# Patient Record
Sex: Female | Born: 1950
Health system: Southern US, Community
[De-identification: ages and names within clinical notes are randomized; demographics above are authoritative.]

## PROBLEM LIST (undated history)

## (undated) DIAGNOSIS — R7303 Prediabetes: Secondary | ICD-10-CM

## (undated) DIAGNOSIS — I639 Cerebral infarction, unspecified: Secondary | ICD-10-CM

## (undated) DIAGNOSIS — I10 Essential (primary) hypertension: Secondary | ICD-10-CM

## (undated) DIAGNOSIS — K219 Gastro-esophageal reflux disease without esophagitis: Secondary | ICD-10-CM

## (undated) DIAGNOSIS — F32A Depression, unspecified: Secondary | ICD-10-CM

## (undated) DIAGNOSIS — C541 Malignant neoplasm of endometrium: Secondary | ICD-10-CM

## (undated) DIAGNOSIS — U071 COVID-19: Secondary | ICD-10-CM

## (undated) DIAGNOSIS — F329 Major depressive disorder, single episode, unspecified: Secondary | ICD-10-CM

## (undated) DIAGNOSIS — T4145XA Adverse effect of unspecified anesthetic, initial encounter: Secondary | ICD-10-CM

## (undated) DIAGNOSIS — Z8673 Personal history of transient ischemic attack (TIA), and cerebral infarction without residual deficits: Secondary | ICD-10-CM

## (undated) DIAGNOSIS — F419 Anxiety disorder, unspecified: Secondary | ICD-10-CM

## (undated) DIAGNOSIS — J45909 Unspecified asthma, uncomplicated: Secondary | ICD-10-CM

## (undated) DIAGNOSIS — I429 Cardiomyopathy, unspecified: Secondary | ICD-10-CM

## (undated) DIAGNOSIS — T8859XA Other complications of anesthesia, initial encounter: Secondary | ICD-10-CM

## (undated) DIAGNOSIS — E785 Hyperlipidemia, unspecified: Secondary | ICD-10-CM

## (undated) HISTORY — DX: Unspecified asthma, uncomplicated: J45.909

## (undated) HISTORY — DX: Hyperlipidemia, unspecified: E78.5

## (undated) HISTORY — DX: Malignant neoplasm of endometrium: C54.1

## (undated) HISTORY — DX: Personal history of transient ischemic attack (TIA), and cerebral infarction without residual deficits: Z86.73

## (undated) HISTORY — PX: ABDOMINAL HYSTERECTOMY: SHX81

## (undated) HISTORY — DX: Prediabetes: R73.03

## (undated) HISTORY — PX: CHOLECYSTECTOMY: SHX55

## (undated) HISTORY — DX: Essential (primary) hypertension: I10

---
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
## 1988-12-29 DIAGNOSIS — I639 Cerebral infarction, unspecified: Secondary | ICD-10-CM

## 1988-12-29 HISTORY — DX: Cerebral infarction, unspecified: I63.9

## 2009-12-07 ENCOUNTER — Ambulatory Visit: Payer: Self-pay | Admitting: Family Medicine

## 2011-05-20 ENCOUNTER — Other Ambulatory Visit: Payer: Self-pay

## 2013-12-26 ENCOUNTER — Emergency Department: Payer: Self-pay | Admitting: Emergency Medicine

## 2015-11-28 ENCOUNTER — Ambulatory Visit: Payer: Self-pay | Admitting: Family Medicine

## 2015-12-05 ENCOUNTER — Ambulatory Visit: Payer: Self-pay | Admitting: Family Medicine

## 2015-12-10 ENCOUNTER — Encounter: Payer: Self-pay | Admitting: Family Medicine

## 2015-12-10 ENCOUNTER — Ambulatory Visit (INDEPENDENT_AMBULATORY_CARE_PROVIDER_SITE_OTHER): Payer: Self-pay | Admitting: Family Medicine

## 2015-12-10 VITALS — BP 110/68 | HR 82 | Temp 97.9°F | Resp 12 | Ht 59.0 in | Wt 217.8 lb

## 2015-12-10 DIAGNOSIS — B839 Helminthiasis, unspecified: Secondary | ICD-10-CM | POA: Insufficient documentation

## 2015-12-10 DIAGNOSIS — Z889 Allergy status to unspecified drugs, medicaments and biological substances status: Secondary | ICD-10-CM

## 2015-12-10 DIAGNOSIS — E785 Hyperlipidemia, unspecified: Secondary | ICD-10-CM

## 2015-12-10 DIAGNOSIS — I10 Essential (primary) hypertension: Secondary | ICD-10-CM | POA: Insufficient documentation

## 2015-12-10 DIAGNOSIS — Z8673 Personal history of transient ischemic attack (TIA), and cerebral infarction without residual deficits: Secondary | ICD-10-CM | POA: Insufficient documentation

## 2015-12-10 DIAGNOSIS — I119 Hypertensive heart disease without heart failure: Secondary | ICD-10-CM | POA: Insufficient documentation

## 2015-12-10 DIAGNOSIS — Z789 Other specified health status: Secondary | ICD-10-CM | POA: Insufficient documentation

## 2015-12-10 DIAGNOSIS — I43 Cardiomyopathy in diseases classified elsewhere: Secondary | ICD-10-CM

## 2015-12-10 HISTORY — DX: Personal history of transient ischemic attack (TIA), and cerebral infarction without residual deficits: Z86.73

## 2015-12-10 HISTORY — DX: Hyperlipidemia, unspecified: E78.5

## 2015-12-10 MED ORDER — MEBENDAZOLE 100 MG PO CHEW: 100.0000 mg | CHEWABLE_TABLET | Freq: Two times a day (BID) | ORAL | Status: DC

## 2015-12-10 NOTE — Progress Notes (Signed)
Name: Molly [name]   MRN: [id number]    DOB: [birth year]   Date:12/10/2015       Progress Note  Subjective  Chief Complaint  Chief Complaint  Patient presents with  . Establish Care  . GI Problem    patient stated that she has 2 episodes where she saw a parasite after bowel movement.    HPI  Molly [name] is a 64 y.o. female here today to transition care of medical needs to a primary care provider. She has a history of "heart damage" but does not report a history of MI or CAD. She had a hard time controlling her blood pressure she reports leading to heart damage. She is on micardis, hctz, furosemide doses unknown to her at this time. Reports her symptoms well controled.   Within the past 6 weeks she noticed 2 episodes of possible worm in the toilet bowl with her stools. Reports no changes to her bowel habits, no abdominal pain, some rumbling in her epigastric area but not uncomfortable. No nausea or vomiting or fevers.  Labs done earlier this year. Self pay today, can not afford to much testing.     Past Medical History  Diagnosis Date  . Asthma     history of asthma  . Hypertension     Patient Active Problem List   Diagnosis Date Noted  . Hypertension goal BP (blood pressure) < 140/90 12/10/2015    Social History  Substance Use Topics  . Smoking status: Never Smoker   . Smokeless tobacco: Not on file  . Alcohol Use: No    No current outpatient prescriptions on file.  Past Surgical History  Procedure Laterality Date  . Cholecystectomy      Family History  Problem Relation Age of Onset  . Gout Father   . Asthma Father   . Asthma Brother   . Dementia Brother   . Stroke Brother     Allergies  Allergen Reactions  . Nsaids Hives and Rash  . Statins Other (See Comments)    Joint pains     Review of Systems  CONSTITUTIONAL: No significant weight changes, fever, chills, weakness or fatigue.  HEENT:  - Eyes: No visual changes.  - Ears: No auditory  changes. No pain.  - Nose: No sneezing, congestion, runny nose. - Throat: No sore throat. No changes in swallowing. SKIN: No rash or itching.  CARDIOVASCULAR: No chest pain, chest pressure or chest discomfort. No palpitations or edema.  RESPIRATORY: No shortness of breath, cough or sputum.  GASTROINTESTINAL: No anorexia, nausea, vomiting. No changes in bowel habits. No abdominal pain or blood.  GENITOURINARY: No dysuria. No frequency. No discharge. NEUROLOGICAL: No headache, dizziness, syncope, paralysis, ataxia, numbness or tingling in the extremities. No memory changes. No change in bowel or bladder control.  MUSCULOSKELETAL: No joint pain. No muscle pain. HEMATOLOGIC: No anemia, bleeding or bruising.  LYMPHATICS: No enlarged lymph nodes.  PSYCHIATRIC: No change in mood. No change in sleep pattern.  ENDOCRINOLOGIC: No reports of sweating, cold or heat intolerance. No polyuria or polydipsia.     Objective  BP 110/68 mmHg  Pulse 82  Temp(Src) 97.9 F (36.6 C) (Oral)  Resp 12  Ht 4\' 11"  (1.499 m)  Wt 217 lb 12.8 oz (98.793 kg)  BMI 43.97 kg/m2  SpO2 95% Body mass index is 43.97 kg/(m^2).  Physical Exam  Constitutional: Patient is obese and well-nourished. In no distress.  HEENT:  - Head: Normocephalic and atraumatic.  - Nose:  Nasal mucosa moist - Mouth/Throat: Oropharynx is clear and moist. No tonsillar hypertrophy or erythema. No post nasal drainage.  - Eyes: Conjunctivae clear, EOM movements normal. PERRLA. No scleral icterus.  Neck: Normal range of motion. Neck supple. No JVD present. No thyromegaly present.  Cardiovascular: Normal rate, regular rhythm and normal heart sounds.  No murmur heard.  Pulmonary/Chest: Effort normal and breath sounds normal. No respiratory distress. Abdomen: Soft, obese, non tender, normal bowel sounds.  Peripheral vascular: Bilateral LE no edema. Psychiatric: Patient has a normal mood and affect. Behavior is normal in office today. Judgment and  thought content normal in office today.   Assessment & Plan  1. Hypertension goal BP (blood pressure) < 140/90 Well controled today. Need to update med list with correct doses.   2. Cardiomyopathy due to hypertension, without heart failure ([place]) Need to review previous medical records, may or may not involve heart failure.   3. History of CVA (cerebrovascular accident) Medical management, intolerant to statins unfortunately.   4. Hyperlipidemia LDL goal <70 Needs repeat blood work when has finances.   5. Statin intolerance   6. Worm infection I would like to have stool studies done however due to self pay status will treat.  - mebendazole (EMVERM) 100 MG chewable tablet; Chew 1 tablet (100 mg total) by mouth 2 (two) times daily.  Dispense: 6 tablet; Refill: 0

## 2016-01-30 ENCOUNTER — Encounter: Payer: Self-pay | Admitting: Family Medicine

## 2016-02-04 ENCOUNTER — Other Ambulatory Visit: Payer: Self-pay

## 2016-02-04 NOTE — Telephone Encounter (Signed)
Molly [name] states her mothers sciatica is acting up and would like refill

## 2016-02-06 MED ORDER — BACLOFEN 10 MG PO TABS: 10.0000 mg | ORAL_TABLET | Freq: Three times a day (TID) | ORAL | Status: DC

## 2016-02-07 ENCOUNTER — Other Ambulatory Visit: Payer: Self-pay

## 2016-02-08 ENCOUNTER — Other Ambulatory Visit: Payer: Self-pay

## 2016-02-08 MED ORDER — FUROSEMIDE 40 MG PO TABS: 40.0000 mg | ORAL_TABLET | Freq: Every day | ORAL | Status: DC

## 2016-02-11 ENCOUNTER — Other Ambulatory Visit: Payer: Self-pay | Admitting: Family Medicine

## 2016-02-11 DIAGNOSIS — R05 Cough: Secondary | ICD-10-CM

## 2016-02-11 DIAGNOSIS — R059 Cough, unspecified: Secondary | ICD-10-CM

## 2016-02-11 MED ORDER — BENZONATATE 200 MG PO CAPS: 200.0000 mg | ORAL_CAPSULE | Freq: Three times a day (TID) | ORAL | Status: DC | PRN

## 2016-02-11 MED ORDER — HYDROCOD POLST-CPM POLST ER 10-8 MG/5ML PO SUER: 5.0000 mL | Freq: Two times a day (BID) | ORAL | Status: DC | PRN

## 2016-02-13 ENCOUNTER — Ambulatory Visit (INDEPENDENT_AMBULATORY_CARE_PROVIDER_SITE_OTHER): Payer: Self-pay | Admitting: Family Medicine

## 2016-02-13 ENCOUNTER — Encounter: Payer: Self-pay | Admitting: Family Medicine

## 2016-02-13 VITALS — BP 122/78 | HR 79 | Temp 98.6°F | Resp 18 | Wt 225.0 lb

## 2016-02-13 DIAGNOSIS — J209 Acute bronchitis, unspecified: Secondary | ICD-10-CM

## 2016-02-13 DIAGNOSIS — J40 Bronchitis, not specified as acute or chronic: Secondary | ICD-10-CM

## 2016-02-13 MED ORDER — FLUTICASONE-SALMETEROL 250-50 MCG/DOSE IN AEPB: 1.0000 | INHALATION_SPRAY | Freq: Two times a day (BID) | RESPIRATORY_TRACT | Status: DC

## 2016-02-13 MED ORDER — PREDNISONE 10 MG (21) PO TBPK: ORAL_TABLET | ORAL | Status: DC

## 2016-02-13 NOTE — Progress Notes (Signed)
Name: Molly [name]   MRN: [id number]    DOB: [birth year]   Date:02/13/2016       Progress Note  Subjective  Chief Complaint  Chief Complaint  Patient presents with  . Bronchitis    HPI  Molly [name] is a 65 year old female who is here today with concerns regarding the following symptoms sore throat, congestion, cough, achiness and low grade fevers that started 1 week ago.  Associated with chills, sweats and fatigue. Now progressing to wheezing. Not associated with high fever. Her CXR in the ER is reported to have been clear. Has tried the following home remedies: ER- gave her amoxicillin and albuterol inhaler. She is still having wheezing. In the past prednisone has caused her to feel hyper but she is willing to use it again if needed.   Past Medical History  Diagnosis Date  . Asthma     history of asthma  . Hypertension     Social History  Substance Use Topics  . Smoking status: Never Smoker   . Smokeless tobacco: Not on file  . Alcohol Use: No     Current outpatient prescriptions:  .  albuterol (PROVENTIL HFA;VENTOLIN HFA) 108 (90 Base) MCG/ACT inhaler, Inhale 2 puffs into the lungs every 4 (four) hours as needed for wheezing or shortness of breath., Disp: , Rfl:  .  baclofen (LIORESAL) 10 MG tablet, Take 1 tablet (10 mg total) by mouth 3 (three) times daily., Disp: 30 each, Rfl: 1 .  benzonatate (TESSALON) 200 MG capsule, Take 1 capsule (200 mg total) by mouth 3 (three) times daily as needed for cough., Disp: 30 capsule, Rfl: 0 .  cetirizine (ZYRTEC) 10 MG tablet, Take 10 mg by mouth daily., Disp: , Rfl:  .  chlorpheniramine-HYDROcodone (TUSSIONEX PENNKINETIC ER) 10-8 MG/5ML SUER, Take 5 mLs by mouth every 12 (twelve) hours as needed., Disp: 115 mL, Rfl: 0 .  diclofenac sodium (VOLTAREN) 1 % GEL, Apply 2 g topically 4 (four) times daily., Disp: , Rfl:  .  furosemide (LASIX) 40 MG tablet, Take 1 tablet (40 mg total) by mouth daily., Disp: 30 tablet, Rfl: 5 .   hydrochlorothiazide (HYDRODIURIL) 25 MG tablet, Take 25 mg by mouth daily., Disp: , Rfl:  .  olmesartan-hydrochlorothiazide (BENICAR HCT) 20-12.5 MG tablet, Take 1 tablet by mouth daily., Disp: , Rfl:  .  potassium chloride (K-DUR) 10 MEQ tablet, Take 10 mEq by mouth daily., Disp: , Rfl:   Allergies  Allergen Reactions  . Nsaids Hives and Rash  . Statins Other (See Comments)    Joint pains    ROS  Positive for fatigue, nasal congestion, sinus pressure, ear fullness, cough as mentioned in HPI, otherwise all systems reviewed and are negative.  Objective  Blood pressure 122/78, pulse 79, temperature 98.6 F (37 C), temperature source Oral, resp. rate 18, weight 225 lb (102.059 kg), SpO2 94 %. Body mass index is 45.42 kg/(m^2).  Physical Exam  Constitutional: Patient is obese and well-nourished. In no acute distress but does appear to be fatigued from acute illness. HEENT:  - Head: Normocephalic and atraumatic.  - Ears: RIGHT TM bulging with minimal clear exudate, LEFT TM bulging with minimal clear exudate.  - Nose: Nasal mucosa boggy and congested.  - Mouth/Throat: Oropharynx is moist with slight erythema of bilateral tonsils without hypertrophy or exudates. Post nasal drainage present.  - Eyes: Conjunctivae clear, EOM movements normal. PERRLA. No scleral icterus.  Neck: Normal range of motion. Neck supple. No JVD present.  No thyromegaly present. No local lymphadenopathy. Cardiovascular: Regular rate, regular rhythm with no murmurs heard.  Pulmonary/Chest: Effort normal and breath sounds decreased with scattered end expiratory wheezing. Skin: Skin is warm and dry. No rash noted. Psychiatric: Patient has her kind baseline mood and affect. Behavior is normal in office today. Judgment and thought content normal in office today.   Assessment & Plan  1. Bronchitis with bronchospasm Tussionex is helping with coughing symptoms but she has extensive wheezing and airway reactivity on  exam. She is willing to start prednisone and inhaled corticosteroid if cost effective. If symptoms persist past this weekend will add on Levaquin 500 mg one a day for 7 days.  - predniSONE (STERAPRED UNI-PAK 21 TAB) 10 MG (21) TBPK tablet; Use as directed in a 6 day taper pack  Dispense: 21 tablet; Refill: 0 - Fluticasone-Salmeterol (ADVAIR DISKUS) 250-50 MCG/DOSE AEPB; Inhale 1 puff into the lungs 2 (two) times daily.  Dispense: 60 each; Refill: 0

## 2016-02-14 ENCOUNTER — Telehealth: Payer: Self-pay

## 2016-02-14 DIAGNOSIS — J209 Acute bronchitis, unspecified: Secondary | ICD-10-CM

## 2016-02-14 MED ORDER — FLUTICASONE FUROATE-VILANTEROL 100-25 MCG/INH IN AEPB: 1.0000 | INHALATION_SPRAY | Freq: Every day | RESPIRATORY_TRACT | Status: DC

## 2016-02-14 NOTE — Telephone Encounter (Signed)
Sorry Garvin Fila said they only have the Breo in 200 is this ok?

## 2016-02-14 NOTE — Telephone Encounter (Signed)
Tomeka wanted me to ask you if you rather her mother be on Breo instead of advair? If so can u write new rx

## 2016-02-14 NOTE — Telephone Encounter (Signed)
I faxed the Breo Rx to the [facility] management clinic on [address]. If that didn't go through you can call it in. Let Temeka know, thank you.

## 2016-02-15 MED ORDER — FLUTICASONE FUROATE-VILANTEROL 200-25 MCG/INH IN AEPB: 1.0000 | INHALATION_SPRAY | Freq: Every day | RESPIRATORY_TRACT | Status: DC

## 2016-02-15 MED ORDER — LEVOFLOXACIN 750 MG PO TABS: 750.0000 mg | ORAL_TABLET | Freq: Every day | ORAL | Status: DC

## 2016-02-15 NOTE — Telephone Encounter (Signed)
Hi Jamie, I also texted Kristeen Miss about it since she texted me this morning.Marland KitchenMarland KitchenI have sent a new RX to medication management clinic for Breo 257mcg dose AND Levofloxacin 750mg  one a day for 10 days (if they don't have this dose and they have 500 mg dose that is okay to call in too (one a day for 7 or 10 days depending on cost)). IF they don't have the levaquin at all try calling it into [facility] they should have it.

## 2016-02-15 NOTE — Telephone Encounter (Signed)
[facility] notified.  I also called and they do have Levaquin 750mg 

## 2016-02-28 ENCOUNTER — Other Ambulatory Visit: Payer: Self-pay

## 2016-02-28 DIAGNOSIS — R059 Cough, unspecified: Secondary | ICD-10-CM

## 2016-02-28 DIAGNOSIS — R05 Cough: Secondary | ICD-10-CM

## 2016-02-28 NOTE — Telephone Encounter (Signed)
Refill request was sent to Dr. Krichna Sowles for approval and submission.  

## 2016-03-05 ENCOUNTER — Other Ambulatory Visit: Payer: Self-pay | Admitting: Family Medicine

## 2016-03-05 DIAGNOSIS — E876 Hypokalemia: Secondary | ICD-10-CM

## 2016-03-05 DIAGNOSIS — R059 Cough, unspecified: Secondary | ICD-10-CM

## 2016-03-05 DIAGNOSIS — R05 Cough: Secondary | ICD-10-CM

## 2016-03-05 DIAGNOSIS — I10 Essential (primary) hypertension: Secondary | ICD-10-CM

## 2016-03-05 MED ORDER — OLMESARTAN MEDOXOMIL-HCTZ 20-12.5 MG PO TABS: 1.0000 | ORAL_TABLET | Freq: Every day | ORAL | Status: DC

## 2016-03-05 MED ORDER — POTASSIUM CHLORIDE ER 10 MEQ PO TBCR: 10.0000 meq | EXTENDED_RELEASE_TABLET | Freq: Every day | ORAL | Status: DC

## 2016-03-05 MED ORDER — HYDROCOD POLST-CPM POLST ER 10-8 MG/5ML PO SUER: 5.0000 mL | Freq: Two times a day (BID) | ORAL | Status: DC | PRN

## 2016-03-05 MED ORDER — POTASSIUM CHLORIDE CRYS ER 10 MEQ PO TBCR: 10.0000 meq | EXTENDED_RELEASE_TABLET | Freq: Every day | ORAL | Status: DC

## 2016-03-05 MED ORDER — HYDROCHLOROTHIAZIDE 25 MG PO TABS: 25.0000 mg | ORAL_TABLET | Freq: Every day | ORAL | Status: DC

## 2016-03-05 MED ORDER — FUROSEMIDE 40 MG PO TABS: 40.0000 mg | ORAL_TABLET | Freq: Every day | ORAL | Status: DC

## 2016-03-05 NOTE — Progress Notes (Signed)
K-dur is not absorbing, finding pill in stool despite cutting in half. Will try her on Klor-Con

## 2016-03-05 NOTE — Progress Notes (Signed)
K-dur is the only med stocked at current pharmacy, pharmacist explained that the med is being absorbed but the capsule remnants are in the stool.

## 2016-05-14 ENCOUNTER — Ambulatory Visit (INDEPENDENT_AMBULATORY_CARE_PROVIDER_SITE_OTHER): Payer: Self-pay | Admitting: Family Medicine

## 2016-05-14 ENCOUNTER — Encounter: Payer: Self-pay | Admitting: Family Medicine

## 2016-05-14 VITALS — BP 116/72 | HR 85 | Temp 98.1°F | Resp 16 | Ht 59.0 in | Wt 226.2 lb

## 2016-05-14 DIAGNOSIS — R600 Localized edema: Secondary | ICD-10-CM

## 2016-05-14 DIAGNOSIS — J302 Other seasonal allergic rhinitis: Secondary | ICD-10-CM | POA: Insufficient documentation

## 2016-05-14 DIAGNOSIS — J4520 Mild intermittent asthma, uncomplicated: Secondary | ICD-10-CM | POA: Insufficient documentation

## 2016-05-14 DIAGNOSIS — J3089 Other allergic rhinitis: Secondary | ICD-10-CM

## 2016-05-14 DIAGNOSIS — M25561 Pain in right knee: Secondary | ICD-10-CM

## 2016-05-14 DIAGNOSIS — Z8673 Personal history of transient ischemic attack (TIA), and cerebral infarction without residual deficits: Secondary | ICD-10-CM

## 2016-05-14 DIAGNOSIS — I10 Essential (primary) hypertension: Secondary | ICD-10-CM

## 2016-05-14 DIAGNOSIS — J309 Allergic rhinitis, unspecified: Secondary | ICD-10-CM

## 2016-05-14 MED ORDER — OLMESARTAN MEDOXOMIL-HCTZ 20-12.5 MG PO TABS: 1.0000 | ORAL_TABLET | Freq: Every day | ORAL | Status: DC

## 2016-05-14 MED ORDER — ACETAMINOPHEN 500 MG PO TABS: 500.0000 mg | ORAL_TABLET | Freq: Four times a day (QID) | ORAL | Status: DC | PRN

## 2016-05-14 MED ORDER — FUROSEMIDE 40 MG PO TABS: 40.0000 mg | ORAL_TABLET | Freq: Every day | ORAL | Status: DC

## 2016-05-14 MED ORDER — CETIRIZINE HCL 10 MG PO TABS: 10.0000 mg | ORAL_TABLET | Freq: Every day | ORAL | Status: DC

## 2016-05-14 MED ORDER — MONTELUKAST SODIUM 10 MG PO TABS: 10.0000 mg | ORAL_TABLET | Freq: Every day | ORAL | Status: DC

## 2016-05-14 NOTE — Progress Notes (Signed)
Name: Molly [name]   MRN: [id number]    DOB: [birth year]   Date:05/14/2016       Progress Note  Subjective  Chief Complaint  Chief Complaint  Patient presents with  . Medication Refill  . Knee Pain    right knee from the inside to the back of the knee. patient states that they hurt and when she stands up it gets stiff. patient stated that it has been going on for quite some time now, but it is sporadic.     HPI  HTN: she does not have insurance, getting rx through [facility] for free. She is compliant with medication, no chest pain or palpitation. BP is towards low end of normal and is taking too many diuretics. We will stop HCTZ 25 mg, continue Benicar HCZT and lasix for swelling. She denies cramping and has been taking potassium as prescribed.   Asthma Mild intermittent: she has a rescue inhaler and states using it more often this time of the year because of allergies. Also during Fall. She develops wheezing but no SOB when she is outside and resolves with inhaler.   AR: taking Zyrtec, and still has watery and itchy eyes, no nasal congestion  History of CVA: many years ago in the 90's , unable to tolerate statin therapy, bp is at goal, no focal deficit  Bilateral  Knee pain: she states she has daily pain, on either knee, but seems to be worse on right side. She states that pain is described as aching usually in the front of the knee, but at times has pain on right popliteal fossa. Worse over the past 6 months but also having some right posterior leg pain when sitting and applying pressure. Chronic swelling of right ankle from an injury as a child.   Patient Active Problem List   Diagnosis Date Noted  . Perennial allergic rhinitis with seasonal variation 05/14/2016  . Asthma, mild intermittent, well-controlled 05/14/2016  . Hypertension goal BP (blood pressure) < 140/90 12/10/2015  . Cardiomyopathy due to hypertension ([place]) 12/10/2015  . History of CVA (cerebrovascular  accident) 12/10/2015  . Hyperlipidemia LDL goal <70 12/10/2015  . Statin intolerance 12/10/2015    Past Surgical History  Procedure Laterality Date  . Cholecystectomy      Family History  Problem Relation Age of Onset  . Gout Father   . Asthma Father   . Asthma Brother   . Dementia Brother   . Stroke Brother     Social History   Social History  . Marital Status: Divorced    Spouse Name: N/A  . Number of Children: N/A  . Years of Education: N/A   Occupational History  . Not on file.   Social History Main Topics  . Smoking status: Never Smoker   . Smokeless tobacco: Not on file  . Alcohol Use: No  . Drug Use: No  . Sexual Activity: No   Other Topics Concern  . Not on file   Social History Narrative     Current outpatient prescriptions:  .  Cholecalciferol (VITAMIN D3) 1000 units CAPS, Take by mouth as needed., Disp: , Rfl:  .  GARLIC PO, Take by mouth., Disp: , Rfl:  .  albuterol (PROVENTIL HFA;VENTOLIN HFA) 108 (90 Base) MCG/ACT inhaler, Inhale 2 puffs into the lungs every 4 (four) hours as needed for wheezing or shortness of breath., Disp: , Rfl:  .  baclofen (LIORESAL) 10 MG tablet, Take 1 tablet (10 mg total) by  mouth 3 (three) times daily., Disp: 30 each, Rfl: 1 .  cetirizine (ZYRTEC) 10 MG tablet, Take 1 tablet (10 mg total) by mouth daily., Disp: 90 tablet, Rfl: 1 .  diclofenac sodium (VOLTAREN) 1 % GEL, Apply 2 g topically 4 (four) times daily., Disp: , Rfl:  .  furosemide (LASIX) 40 MG tablet, Take 1 tablet (40 mg total) by mouth daily., Disp: 90 tablet, Rfl: 1 .  olmesartan-hydrochlorothiazide (BENICAR HCT) 20-12.5 MG tablet, Take 1 tablet by mouth daily., Disp: 90 tablet, Rfl: 1 .  potassium chloride (K-DUR,KLOR-CON) 10 MEQ tablet, Take 1 tablet (10 mEq total) by mouth daily., Disp: 90 tablet, Rfl: 1  Allergies  Allergen Reactions  . Nsaids Hives and Rash  . Statins Other (See Comments)    Joint pains     ROS  Ten systems reviewed and is  negative except as mentioned in HPI   Objective  Filed Vitals:   05/14/16 1233  BP: 116/72  Pulse: 85  Temp: 98.1 F (36.7 C)  TempSrc: Oral  Resp: 16  Height: 4\' 11"  (1.499 m)  Weight: 226 lb 3.2 oz (102.604 kg)  SpO2: 94%    Body mass index is 45.66 kg/(m^2).  Physical Exam  Constitutional: Patient appears well-developed and well-nourished. Obese  No distress.  HEENT: head atraumatic, normocephalic, pupils equal and reactive to light, neck supple, throat within normal limits Cardiovascular: Normal rate, regular rhythm and normal heart sounds.  No murmur heard. No BLE edema. Pulmonary/Chest: Effort normal and breath sounds normal. No respiratory distress. Abdominal: Soft.  There is no tenderness. Psychiatric: Patient has a normal mood and affect. behavior is normal. Judgment and thought content normal. Muscular Skeletal: no crepitus with extension of knee, no effusion, some swelling on popliteal fossa  PHQ2/9: Depression screen [facility] 2/9 05/14/2016 02/13/2016 12/10/2015  Decreased Interest 0 0 0  Down, Depressed, Hopeless 0 0 0  PHQ - 2 Score 0 0 0    Fall Risk: Fall Risk  05/14/2016 02/13/2016 12/10/2015  Falls in the past year? No No No    Functional Status Survey: Is the patient deaf or have difficulty hearing?: No Does the patient have difficulty seeing, even when wearing glasses/contacts?: No Does the patient have difficulty concentrating, remembering, or making decisions?: No Does the patient have difficulty walking or climbing stairs?: Yes Does the patient have difficulty dressing or bathing?: No Does the patient have difficulty doing errands alone such as visiting a doctor's office or shopping?: No    Assessment & Plan  1. Hypertension goal BP (blood pressure) < 140/90  Stop HCTZ, continue other medications  2. Perennial allergic rhinitis with seasonal variation  - cetirizine (ZYRTEC) 10 MG tablet; Take 1 tablet (10 mg total) by mouth daily.  Dispense: 90  tablet; Refill: 1 - montelukast (SINGULAIR) 10 MG tablet; Take 1 tablet (10 mg total) by mouth at bedtime.  Dispense: 90 tablet; Refill: 1  3. History of CVA (cerebrovascular accident)  No recent episodes  4. Asthma, mild intermittent, well-controlled  - montelukast (SINGULAIR) 10 MG tablet; Take 1 tablet (10 mg total) by mouth at bedtime.  Dispense: 90 tablet; Refill: 1  5. Right knee pain  Likely OA and Baker's cyst but can't afford X-ray - acetaminophen (TYLENOL) 500 MG tablet; Take 1 tablet (500 mg total) by mouth every 6 (six) hours as needed.  Dispense: 30 tablet; Refill: 0  6. Leg edema, right  She needs X-ray knee and vascular surgeon evaluation, but does not have insurance and  can't afford it

## 2016-06-02 ENCOUNTER — Other Ambulatory Visit: Payer: Self-pay

## 2016-06-02 DIAGNOSIS — E876 Hypokalemia: Secondary | ICD-10-CM

## 2016-06-02 MED ORDER — POTASSIUM CHLORIDE CRYS ER 10 MEQ PO TBCR: 10.0000 meq | EXTENDED_RELEASE_TABLET | Freq: Every day | ORAL | Status: DC

## 2016-06-04 ENCOUNTER — Telehealth: Payer: Self-pay

## 2016-06-04 NOTE — Telephone Encounter (Signed)
Patient stated that she has been having bilateral swelling since stopping the HCTZ 25mg .  She was switched to Benicar 20-12.5.  Please advise.

## 2016-06-04 NOTE — Telephone Encounter (Signed)
The dose of HCTZ that she was taking was too high, and can deplete her potassium. She needs to buy compression stocking hoses and continue lasix prn

## 2016-08-28 ENCOUNTER — Other Ambulatory Visit: Payer: Self-pay | Admitting: Family Medicine

## 2016-08-28 ENCOUNTER — Other Ambulatory Visit: Payer: Self-pay

## 2016-08-28 DIAGNOSIS — I10 Essential (primary) hypertension: Secondary | ICD-10-CM

## 2016-08-28 NOTE — Telephone Encounter (Signed)
She is on Benicar/hctz Is that what she needs?

## 2016-08-28 NOTE — Telephone Encounter (Addendum)
Patient requesting refill of HCTZ be [facility] to [facility].

## 2016-08-29 ENCOUNTER — Other Ambulatory Visit: Payer: Self-pay

## 2016-08-29 DIAGNOSIS — E876 Hypokalemia: Secondary | ICD-10-CM

## 2016-08-29 NOTE — Telephone Encounter (Signed)
Patient requesting refill of Potassium, patient is out of refills and needs medication until her appointment. She only has 3 left in her bottle.

## 2016-09-01 MED ORDER — POTASSIUM CHLORIDE CRYS ER 10 MEQ PO TBCR: 10.0000 meq | EXTENDED_RELEASE_TABLET | Freq: Every day | ORAL | 1 refills | Status: DC

## 2016-09-03 NOTE — Telephone Encounter (Signed)
She should have an appointment soon, we will need to discuss it during her viist

## 2016-09-19 ENCOUNTER — Telehealth: Payer: Self-pay

## 2016-09-19 NOTE — Telephone Encounter (Signed)
Patient called and states for the past 2 days she has been experiencing a deep cough that is keeping her awake at night. She has been using her Breo with no relief. Could you please send in the [facility] for her at [place].

## 2016-09-19 NOTE — Telephone Encounter (Signed)
She needs to be seen, may be CAP or bronchitis

## 2016-09-22 ENCOUNTER — Ambulatory Visit: Payer: Self-pay | Admitting: Family Medicine

## 2016-09-24 NOTE — Telephone Encounter (Signed)
We had scheduled patient an appointment but Beau Fanny (daughter) cancelled appointment.

## 2016-10-22 ENCOUNTER — Other Ambulatory Visit: Payer: Self-pay

## 2016-10-22 DIAGNOSIS — E876 Hypokalemia: Secondary | ICD-10-CM

## 2016-10-22 DIAGNOSIS — M25561 Pain in right knee: Secondary | ICD-10-CM

## 2016-10-22 DIAGNOSIS — I10 Essential (primary) hypertension: Secondary | ICD-10-CM

## 2016-10-22 MED ORDER — FUROSEMIDE 40 MG PO TABS: 40.0000 mg | ORAL_TABLET | Freq: Every day | ORAL | 0 refills | Status: DC

## 2016-10-22 MED ORDER — ACETAMINOPHEN 500 MG PO TABS: 500.0000 mg | ORAL_TABLET | Freq: Four times a day (QID) | ORAL | 0 refills | Status: DC | PRN

## 2016-10-22 MED ORDER — POTASSIUM CHLORIDE CRYS ER 10 MEQ PO TBCR: 10.0000 meq | EXTENDED_RELEASE_TABLET | Freq: Every day | ORAL | 0 refills | Status: DC

## 2016-10-22 NOTE — Telephone Encounter (Signed)
Pt needs a refill.

## 2016-11-11 ENCOUNTER — Ambulatory Visit (INDEPENDENT_AMBULATORY_CARE_PROVIDER_SITE_OTHER): Payer: Medicare Other | Admitting: Family Medicine

## 2016-11-11 ENCOUNTER — Encounter: Payer: Self-pay | Admitting: Family Medicine

## 2016-11-11 VITALS — BP 118/62 | HR 71 | Temp 97.9°F | Resp 16 | Ht 58.5 in | Wt 228.3 lb

## 2016-11-11 DIAGNOSIS — Z1211 Encounter for screening for malignant neoplasm of colon: Secondary | ICD-10-CM

## 2016-11-11 DIAGNOSIS — Z1231 Encounter for screening mammogram for malignant neoplasm of breast: Secondary | ICD-10-CM | POA: Diagnosis not present

## 2016-11-11 DIAGNOSIS — E785 Hyperlipidemia, unspecified: Secondary | ICD-10-CM | POA: Diagnosis not present

## 2016-11-11 DIAGNOSIS — I10 Essential (primary) hypertension: Secondary | ICD-10-CM | POA: Diagnosis not present

## 2016-11-11 DIAGNOSIS — J4520 Mild intermittent asthma, uncomplicated: Secondary | ICD-10-CM

## 2016-11-11 DIAGNOSIS — R5383 Other fatigue: Secondary | ICD-10-CM | POA: Diagnosis not present

## 2016-11-11 DIAGNOSIS — I43 Cardiomyopathy in diseases classified elsewhere: Secondary | ICD-10-CM | POA: Diagnosis not present

## 2016-11-11 DIAGNOSIS — I119 Hypertensive heart disease without heart failure: Secondary | ICD-10-CM | POA: Diagnosis not present

## 2016-11-11 DIAGNOSIS — Z23 Encounter for immunization: Secondary | ICD-10-CM | POA: Diagnosis not present

## 2016-11-11 DIAGNOSIS — Z0000 Encounter for general adult medical examination without abnormal findings: Secondary | ICD-10-CM

## 2016-11-11 DIAGNOSIS — R600 Localized edema: Secondary | ICD-10-CM | POA: Diagnosis not present

## 2016-11-11 DIAGNOSIS — Z1159 Encounter for screening for other viral diseases: Secondary | ICD-10-CM

## 2016-11-11 MED ORDER — VALSARTAN-HYDROCHLOROTHIAZIDE 160-12.5 MG PO TABS: 1.0000 | ORAL_TABLET | Freq: Every day | ORAL | 0 refills | Status: DC

## 2016-11-11 MED ORDER — ALBUTEROL SULFATE HFA 108 (90 BASE) MCG/ACT IN AERS: 2.0000 | INHALATION_SPRAY | RESPIRATORY_TRACT | 0 refills | Status: DC | PRN

## 2016-11-11 MED ORDER — MEDICAL COMPRESSION STOCKINGS MISC: 1.0000 | Freq: Every day | 2 refills | Status: DC

## 2016-11-11 NOTE — Patient Instructions (Signed)
  Molly [name] , Thank you for taking time to come for your Medicare Wellness Visit. I appreciate your ongoing commitment to your health goals. Please review the following plan we discussed and let me know if I can assist you in the future.   These are the goals we discussed: Get labs and studies done, start compression stocking hoses, follow up in one month for pap smear and see how you are doing   This is a list of the screening recommended for you and due dates:  Health Maintenance  Topic Date Due  . DEXA scan (bone density measurement)  11/07/2016  . Mammogram  11/13/2016*  .  Hepatitis C: One time screening is recommended by Center for Disease Control  (CDC) for  adults born from [birth year] through 1965.   11/19/2016*  . Pap Smear  11/20/2016*  . Colon Cancer Screening  11/20/2016*  . Shingles Vaccine  11/20/2016*  . HIV Screening  11/18/2029*  . Pneumonia vaccines (2 of 2 - PPSV23) 11/11/2017  . Tetanus Vaccine  11/11/2026  . Flu Shot  Completed  *Topic was postponed. The date shown is not the original due date.

## 2016-11-11 NOTE — Progress Notes (Signed)
Name: Molly [name]   MRN: [id number]    DOB: 21-Aug-1951   Date:11/11/2016       Progress Note  Subjective  Chief Complaint  Chief Complaint  Patient presents with  . Medicare Wellness  . Flu Vaccine    HPI  Functional ability/safety issues: No Issues Hearing issues: Addressed  Activities of daily living: Discussed Home safety issues: No Issues  End Of Life Planning: Offered verbal information regarding advanced directives, healthcare power of attorney.  Preventative care, Health maintenance, Preventative health measures discussed.  Preventative screenings discussed today: lab work, colonoscopy,  mammogram, DEXA.  Low Dose CT Chest recommended if Age 25-80 years, 30 pack-year currently smoking OR have quit w/in 15years.   Lifestyle risk factor issued reviewed: Diet, exercise, weight management, advised patient smoking is not healthy, nutrition/diet.  Preventative health measures discussed (5-10 year plan).  Reviewed and recommended vaccinations: - Pneumovax  - Prevnar  - Annual Influenza - Zostavax ( next visit ) - Tdap   Depression screening: Done Fall risk screening: Done Discuss ADLs/IADLs: Done  Current medical providers: See HPI  Other health risk factors identified this visit: No other issues Cognitive impairment issues: None identified  All above discussed with patient. Appropriate education, counseling and referral will be made based upon the above.   Right lower extremity edema: she states her right leg swells up and resolves at night, she has been taking lasix prn for symptoms but does not seem to work. Symptoms started about 4 years ago  Right lower leg pain and numbness: she states symptoms started about 6 years ago, she states she has some numbness on right lateral thigh, and sometimes a burning and tingling, symptoms used to be worse at night, but now it is just a tingling sensation when standing up. She denies back pain or weakness, she takes  Tylenol prn.   HTN: taking Benicar but states it is too expensive, she had an echo in the past that showed cardiomyopathy, no chest pain or palpitation. She has right lower leg edema  Asthma mild intermittent: doing well on prn ventolin   Patient Active Problem List   Diagnosis Date Noted  . Perennial allergic rhinitis with seasonal variation 05/14/2016  . Asthma, mild intermittent, well-controlled 05/14/2016  . Hypertension goal BP (blood pressure) < 140/90 12/10/2015  . Cardiomyopathy due to hypertension ([place]) 12/10/2015  . History of CVA (cerebrovascular accident) 12/10/2015  . Hyperlipidemia LDL goal <70 12/10/2015  . Statin intolerance 12/10/2015    Past Surgical History:  Procedure Laterality Date  . CHOLECYSTECTOMY      Family History  Problem Relation Age of Onset  . Gout Father   . Asthma Father   . Asthma Brother   . Dementia Brother   . Stroke Brother     Social History   Social History  . Marital status: Divorced    Spouse name: N/A  . Number of children: N/A  . Years of education: N/A   Occupational History  . Not on file.   Social History Main Topics  . Smoking status: Never Smoker  . Smokeless tobacco: Never Used  . Alcohol use No  . Drug use: No  . Sexual activity: No   Other Topics Concern  . Not on file   Social History Narrative  . No narrative on file     Current Outpatient Prescriptions:  .  acetaminophen (TYLENOL) 500 MG tablet, Take 1 tablet (500 mg total) by mouth every 6 (six) hours as needed.,  Disp: 30 tablet, Rfl: 0 .  albuterol (PROVENTIL HFA;VENTOLIN HFA) 108 (90 Base) MCG/ACT inhaler, Inhale 2 puffs into the lungs every 4 (four) hours as needed for wheezing or shortness of breath., Disp: 1 Inhaler, Rfl: 0 .  cetirizine (ZYRTEC) 10 MG tablet, Take 1 tablet (10 mg total) by mouth daily., Disp: 90 tablet, Rfl: 1 .  Cholecalciferol (VITAMIN D3) 1000 units CAPS, Take by mouth as needed., Disp: , Rfl:  .  diclofenac sodium  (VOLTAREN) 1 % GEL, Apply 2 g topically 4 (four) times daily., Disp: , Rfl:  .  furosemide (LASIX) 40 MG tablet, Take 1 tablet (40 mg total) by mouth daily., Disp: 30 tablet, Rfl: 0 .  GARLIC PO, Take by mouth., Disp: , Rfl:  .  valsartan-hydrochlorothiazide (DIOVAN-HCT) 160-12.5 MG tablet, Take 1 tablet by mouth daily., Disp: 30 tablet, Rfl: 0  Allergies  Allergen Reactions  . Nsaids Hives and Rash  . Statins Other (See Comments)    Joint pains     ROS  Constitutional: Negative for fever or weight change.  Respiratory: Negative for cough and shortness of breath.   Cardiovascular: Negative for chest pain or palpitations.  Gastrointestinal: Negative for abdominal pain, no bowel changes.  Musculoskeletal: Positive for gait problem ( from right leg pain ) or joint swelling.  Skin: Negative for rash.  Neurological: Negative for dizziness or headache.  No other specific complaints in a complete review of systems (except as listed in HPI above).  Objective  Vitals:   11/11/16 1013  BP: 118/62  Pulse: 71  Resp: 16  Temp: 97.9 F (36.6 C)  TempSrc: Oral  SpO2: 94%  Weight: 228 lb 5 oz (103.6 kg)  Height: 4' 10.5" (1.486 m)    Body mass index is 46.91 kg/m.  Physical Exam  Constitutional: Patient appears well-developed and well-nourished. Obese  No distress.  HEENT: head atraumatic, normocephalic, pupils equal and reactive to light, neck supple, throat within normal limits Cardiovascular: Normal rate, regular rhythm and normal heart sounds.  SEM heard.  1 plus right lower leg edema, no varicose veins, normal distal pulses Pulmonary/Chest: Effort normal and breath sounds normal. No respiratory distress. Abdominal: Soft.  There is no tenderness. Psychiatric: Patient has a normal mood and affect. behavior is normal. Judgment and thought content normal. Muscular Skeletal: normal rom of hip, no pain during palpation of lumbar spine, crepitus with extension of left  knee  PHQ2/9: Depression screen [facility] 2/9 11/11/2016 05/14/2016 02/13/2016 12/10/2015  Decreased Interest 0 0 0 0  Down, Depressed, Hopeless 0 0 0 0  PHQ - 2 Score 0 0 0 0     Fall Risk: Fall Risk  11/11/2016 05/14/2016 02/13/2016 12/10/2015  Falls in the past year? No No No No    Functional Status Survey: Is the patient deaf or have difficulty hearing?: No Does the patient have difficulty seeing, even when wearing glasses/contacts?: No Does the patient have difficulty concentrating, remembering, or making decisions?: No Does the patient have difficulty walking or climbing stairs?: Yes (leg pain and stiffness) Does the patient have difficulty dressing or bathing?: No Does the patient have difficulty doing errands alone such as visiting a doctor's office or shopping?: No    Assessment & Plan  1. Welcome to Medicare preventive visit  Discussed importance of 150 minutes of physical activity weekly, eat two servings of fish weekly, eat one serving of tree nuts ( cashews, pistachios, pecans, almonds.Marland Kitchen) every other day, eat 6 servings of fruit/vegetables daily and drink  plenty of water and avoid sweet beverages.  - EKG 12-Lead - Visual acuity screening - Lipid panel - CBC with Differential/Platelet - COMPLETE METABOLIC PANEL WITH GFR - Hepatitis C antibody - VITAMIN D 25 Hydroxy (Vit-D Deficiency, Fractures) - TSH - Vitamin B12 - Bone density  2. Need for influenza vaccination  - Flu vaccine HIGH DOSE PF (Fluzone High dose)  3. Need for Tdap vaccination  -Tdap   4. Need for pneumococcal vaccination  - Pneumococcal polysaccharide vaccine 23-valent greater than or equal to 2yo subcutaneous/IM  5. Hypertension goal BP (blood pressure) < 140/90  - valsartan-hydrochlorothiazide (DIOVAN-HCT) 160-12.5 MG tablet; Take 1 tablet by mouth daily.  Dispense: 30 tablet; Refill: 0  6. Leg edema, right  - Ambulatory referral to Vascular Surgery  7. Hyperlipidemia LDL goal <70  -  Lipid panel  8. Cardiomyopathy due to hypertension, without heart failure (HCC)  Stable, no symptoms of heart failure, taking lasix for edema right leg, but advised to stop for now  9. Encounter for screening mammogram for breast cancer  - MM Digital Screening; Future  10. Encounter for screening colonoscopy  - Ambulatory referral to Gastroenterology  11. Other fatigue  - CBC with Differential/Platelet - VITAMIN D 25 Hydroxy (Vit-D Deficiency, Fractures) - TSH - Vitamin B12  12. Need for hepatitis C screening test  - Hepatitis C antibody  13. Asthma, mild intermittent, well-controlled  - albuterol (PROVENTIL HFA;VENTOLIN HFA) 108 (90 Base) MCG/ACT inhaler; Inhale 2 puffs into the lungs every 4 (four) hours as needed for wheezing or shortness of breath.  Dispense: 1 Inhaler; Refill: 0

## 2016-11-18 ENCOUNTER — Other Ambulatory Visit: Payer: Self-pay

## 2016-11-18 DIAGNOSIS — J3089 Other allergic rhinitis: Principal | ICD-10-CM

## 2016-11-18 DIAGNOSIS — J302 Other seasonal allergic rhinitis: Secondary | ICD-10-CM

## 2016-11-18 MED ORDER — MONTELUKAST SODIUM 10 MG PO TABS: 10.0000 mg | ORAL_TABLET | Freq: Every day | ORAL | 0 refills | Status: DC

## 2016-11-18 MED ORDER — CETIRIZINE HCL 10 MG PO TABS: 10.0000 mg | ORAL_TABLET | Freq: Every day | ORAL | 0 refills | Status: DC

## 2016-11-18 NOTE — Progress Notes (Signed)
[place]

## 2016-11-24 ENCOUNTER — Other Ambulatory Visit: Payer: Self-pay

## 2016-11-24 ENCOUNTER — Telehealth: Payer: Self-pay

## 2016-11-24 ENCOUNTER — Encounter: Payer: Self-pay | Admitting: Family Medicine

## 2016-11-24 NOTE — Telephone Encounter (Signed)
Screening Colonoscopy Z12.11 [facility] 0000000 [employer] Pre cert

## 2016-11-24 NOTE — Telephone Encounter (Signed)
Gastroenterology Pre-Procedure Review  Request Date: 01/08/2017 Requesting Physician: Dr. Ancil Boozer  PATIENT REVIEW QUESTIONS: The patient responded to the following health history questions as indicated:    1. Are you having any GI issues? no 2. Do you have a personal history of Polyps? no 3. Do you have a family history of Colon Cancer or Polyps? no 4. Diabetes Mellitus? no 5. Joint replacements in the past 12 months?no 6. Major health problems in the past 3 months?no 7. Any artificial heart valves, MVP, or defibrillator?no    MEDICATIONS & ALLERGIES:    Patient reports the following regarding taking any anticoagulation/antiplatelet therapy:   Plavix, Coumadin, Eliquis, Xarelto, Lovenox, Pradaxa, Brilinta, or Effient? no Aspirin? no  Patient confirms/reports the following medications:  Current Outpatient Prescriptions  Medication Sig Dispense Refill  . acetaminophen (TYLENOL) 500 MG tablet Take 1 tablet (500 mg total) by mouth every 6 (six) hours as needed. 30 tablet 0  . albuterol (PROVENTIL HFA;VENTOLIN HFA) 108 (90 Base) MCG/ACT inhaler Inhale 2 puffs into the lungs every 4 (four) hours as needed for wheezing or shortness of breath. 1 Inhaler 0  . cetirizine (ZYRTEC) 10 MG tablet Take 1 tablet (10 mg total) by mouth daily. 30 tablet 0  . diclofenac sodium (VOLTAREN) 1 % GEL Apply 2 g topically 4 (four) times daily.    Regino Schultze Bandages & Supports (MEDICAL COMPRESSION STOCKINGS) MISC 1 each by Does not apply route daily. Dx right lower extremity edema 20-30 pressure 2 each 2  . furosemide (LASIX) 40 MG tablet Take 1 tablet (40 mg total) by mouth daily. 30 tablet 0  . valsartan-hydrochlorothiazide (DIOVAN-HCT) 160-12.5 MG tablet Take 1 tablet by mouth daily. 30 tablet 0  . Cholecalciferol (VITAMIN D3) 1000 units CAPS Take by mouth as needed.    Marland Kitchen GARLIC PO Take by mouth.     No current facility-administered medications for this visit.     Patient confirms/reports the following  allergies:  Allergies  Allergen Reactions  . Nsaids Hives and Rash  . Statins Other (See Comments)    Joint pains    No orders of the defined types were placed in this encounter.   AUTHORIZATION INFORMATION Primary Insurance: 1D#: Group #:  Secondary Insurance: 1D#: Group #:  SCHEDULE INFORMATION: Date: 01/08/2017 Time: Location: [facility]

## 2016-12-18 ENCOUNTER — Other Ambulatory Visit: Payer: Self-pay | Admitting: Family Medicine

## 2016-12-18 ENCOUNTER — Telehealth: Payer: Self-pay

## 2016-12-18 DIAGNOSIS — J3089 Other allergic rhinitis: Principal | ICD-10-CM

## 2016-12-18 DIAGNOSIS — J302 Other seasonal allergic rhinitis: Secondary | ICD-10-CM

## 2016-12-18 MED ORDER — CETIRIZINE HCL 10 MG PO TABS: 10.0000 mg | ORAL_TABLET | Freq: Every day | ORAL | 0 refills | Status: DC

## 2016-12-18 MED ORDER — MONTELUKAST SODIUM 10 MG PO TABS: 10.0000 mg | ORAL_TABLET | Freq: Every day | ORAL | 0 refills | Status: DC

## 2016-12-18 NOTE — Telephone Encounter (Signed)
Patient needs refills on montelukast 10mg  and cetirizine 10mg  these are  Not on current meds list.  Needs 90 day supply

## 2016-12-23 ENCOUNTER — Ambulatory Visit: Payer: Self-pay

## 2016-12-23 ENCOUNTER — Other Ambulatory Visit: Payer: Self-pay

## 2017-01-08 ENCOUNTER — Ambulatory Visit: Payer: Medicare Other | Admitting: Anesthesiology

## 2017-01-08 ENCOUNTER — Encounter
Admission: RE | Disposition: A | Discharge status: Home / Self Care | Payer: Self-pay | Source: Ambulatory Visit | Attending: Gastroenterology

## 2017-01-08 ENCOUNTER — Ambulatory Visit
Admission: RE | Admit: 2017-01-08 | Discharge: 2017-01-08 | Disposition: A | Discharge status: Home / Self Care | Payer: Medicare Other | Source: Ambulatory Visit | Attending: Gastroenterology | Admitting: Gastroenterology

## 2017-01-08 DIAGNOSIS — D128 Benign neoplasm of rectum: Secondary | ICD-10-CM | POA: Diagnosis not present

## 2017-01-08 DIAGNOSIS — I10 Essential (primary) hypertension: Secondary | ICD-10-CM | POA: Diagnosis not present

## 2017-01-08 DIAGNOSIS — J45909 Unspecified asthma, uncomplicated: Secondary | ICD-10-CM | POA: Diagnosis not present

## 2017-01-08 DIAGNOSIS — K621 Rectal polyp: Secondary | ICD-10-CM

## 2017-01-08 DIAGNOSIS — D123 Benign neoplasm of transverse colon: Secondary | ICD-10-CM | POA: Diagnosis not present

## 2017-01-08 DIAGNOSIS — Z79899 Other long term (current) drug therapy: Secondary | ICD-10-CM | POA: Diagnosis not present

## 2017-01-08 DIAGNOSIS — K635 Polyp of colon: Secondary | ICD-10-CM | POA: Diagnosis not present

## 2017-01-08 DIAGNOSIS — D125 Benign neoplasm of sigmoid colon: Secondary | ICD-10-CM | POA: Diagnosis not present

## 2017-01-08 DIAGNOSIS — Z1211 Encounter for screening for malignant neoplasm of colon: Secondary | ICD-10-CM | POA: Insufficient documentation

## 2017-01-08 DIAGNOSIS — K648 Other hemorrhoids: Secondary | ICD-10-CM | POA: Diagnosis not present

## 2017-01-08 DIAGNOSIS — Z8673 Personal history of transient ischemic attack (TIA), and cerebral infarction without residual deficits: Secondary | ICD-10-CM | POA: Diagnosis not present

## 2017-01-08 DIAGNOSIS — Z6841 Body Mass Index (BMI) 40.0 and over, adult: Secondary | ICD-10-CM | POA: Insufficient documentation

## 2017-01-08 DIAGNOSIS — E669 Obesity, unspecified: Secondary | ICD-10-CM | POA: Insufficient documentation

## 2017-01-08 HISTORY — PX: COLONOSCOPY WITH PROPOFOL: SHX5780

## 2017-01-08 HISTORY — DX: Cerebral infarction, unspecified: I63.9

## 2017-01-08 SURGERY — COLONOSCOPY WITH PROPOFOL
Anesthesia: General

## 2017-01-08 MED ORDER — LIDOCAINE 2% (20 MG/ML) 5 ML SYRINGE
INTRAMUSCULAR | Status: AC
  Filled 2017-01-08: qty 5

## 2017-01-08 MED ORDER — LIDOCAINE 2% (20 MG/ML) 5 ML SYRINGE
INTRAMUSCULAR | Status: DC | PRN
  Administered 2017-01-08: 40 mg via INTRAVENOUS

## 2017-01-08 MED ORDER — PROPOFOL 10 MG/ML IV BOLUS
INTRAVENOUS | Status: DC | PRN
  Administered 2017-01-08: 100 mg via INTRAVENOUS

## 2017-01-08 MED ORDER — FENTANYL CITRATE (PF) 100 MCG/2ML IJ SOLN
INTRAMUSCULAR | Status: AC
  Filled 2017-01-08: qty 2

## 2017-01-08 MED ORDER — MIDAZOLAM HCL 2 MG/2ML IJ SOLN
INTRAMUSCULAR | Status: AC
  Filled 2017-01-08: qty 2

## 2017-01-08 MED ORDER — PROPOFOL 500 MG/50ML IV EMUL
INTRAVENOUS | Status: DC | PRN
  Administered 2017-01-08: 180 ug/kg/min via INTRAVENOUS

## 2017-01-08 MED ORDER — FENTANYL CITRATE (PF) 100 MCG/2ML IJ SOLN
INTRAMUSCULAR | Status: DC | PRN
  Administered 2017-01-08: 50 ug via INTRAVENOUS

## 2017-01-08 MED ORDER — SODIUM CHLORIDE 0.9 % IV SOLN
INTRAVENOUS | Status: DC
  Administered 2017-01-08 (×2): via INTRAVENOUS

## 2017-01-08 MED ORDER — PHENYLEPHRINE 40 MCG/ML (10ML) SYRINGE FOR IV PUSH (FOR BLOOD PRESSURE SUPPORT)
PREFILLED_SYRINGE | INTRAVENOUS | Status: AC
Start: 2017-01-08 — End: 2017-01-08
  Filled 2017-01-08: qty 10

## 2017-01-08 MED ORDER — PROPOFOL 500 MG/50ML IV EMUL
INTRAVENOUS | Status: AC
  Filled 2017-01-08: qty 50

## 2017-01-08 MED ORDER — PHENYLEPHRINE HCL 10 MG/ML IJ SOLN
INTRAMUSCULAR | Status: DC | PRN
Start: 2017-01-08 — End: 2017-01-08
  Administered 2017-01-08: 80 ug via INTRAVENOUS

## 2017-01-08 MED ORDER — MIDAZOLAM HCL 5 MG/5ML IJ SOLN
INTRAMUSCULAR | Status: DC | PRN
  Administered 2017-01-08: 1 mg via INTRAVENOUS

## 2017-01-08 NOTE — H&P (Signed)
[name] MD [address], [place], [place] [address] Phone: [phone] Fax : [phone]  Primary Care Physician:  Loistine Chance, MD Primary Gastroenterologist:  Dr. [name]   Pre-Procedure History & Physical: HPI:  Molly [name] is a 66 y.o. female is here for an colonoscopy.   Past Medical History:  Diagnosis Date  . Asthma    history of asthma  . Hypertension   . Stroke [facility])    1990    Past Surgical History:  Procedure Laterality Date  . CHOLECYSTECTOMY      Prior to Admission medications   Medication Sig Start Date End Date Taking? Authorizing Provider  acetaminophen (TYLENOL) 500 MG tablet Take 1 tablet (500 mg total) by mouth every 6 (six) hours as needed. 10/22/16  Yes Steele Sizer, MD  albuterol (PROVENTIL HFA;VENTOLIN HFA) 108 (90 Base) MCG/ACT inhaler Inhale 2 puffs into the lungs every 4 (four) hours as needed for wheezing or shortness of breath. 11/11/16  Yes Steele Sizer, MD  cetirizine (ZYRTEC) 10 MG tablet Take 1 tablet (10 mg total) by mouth daily. 12/18/16  Yes Steele Sizer, MD  diclofenac sodium (VOLTAREN) 1 % GEL Apply 2 g topically 4 (four) times daily.   Yes Historical Provider, MD  montelukast (SINGULAIR) 10 MG tablet Take 1 tablet (10 mg total) by mouth at bedtime. 12/18/16  Yes Steele Sizer, MD  olmesartan-hydrochlorothiazide (BENICAR HCT) 40-12.5 MG tablet Take 1 tablet by mouth daily.   Yes Historical Provider, MD  Cholecalciferol (VITAMIN D3) 1000 units CAPS Take by mouth as needed.    Historical Provider, MD  Elastic Bandages & Supports (MEDICAL COMPRESSION STOCKINGS) [place] 1 each by Does not apply route daily. Dx right lower extremity edema 20-30 pressure 11/11/16   Steele Sizer, MD  furosemide (LASIX) 40 MG tablet Take 1 tablet (40 mg total) by mouth daily. 10/22/16   Steele Sizer, MD  GARLIC PO Take by mouth.    Historical Provider, MD  valsartan-hydrochlorothiazide (DIOVAN-HCT) 160-12.5 MG tablet Take 1 tablet by  mouth daily. Patient not taking: Reported on 01/08/2017 11/11/16   Steele Sizer, MD    Allergies as of 11/24/2016 - Review Complete 11/24/2016  Allergen Reaction Noted  . Nsaids Hives and Rash 12/10/2015  . Statins Other (See Comments) 12/10/2015    Family History  Problem Relation Age of Onset  . Gout Father   . Asthma Father   . Asthma Brother   . Dementia Brother   . Stroke Brother     Social History   Social History  . Marital status: Divorced    Spouse name: N/A  . Number of children: N/A  . Years of education: N/A   Occupational History  . Not on file.   Social History Main Topics  . Smoking status: Never Smoker  . Smokeless tobacco: Never Used  . Alcohol use No  . Drug use: No  . Sexual activity: No   Other Topics Concern  . Not on file   Social History Narrative  . No narrative on file    Review of Systems: See HPI, otherwise negative ROS  Physical Exam: BP 124/60   Pulse 66   Temp (!) 96.8 F (36 C)   Resp 18   Ht 4\' 11"  (1.499 m)   Wt 224 lb (101.6 kg)   SpO2 97%   BMI 45.24 kg/m  General:   Alert,  pleasant and cooperative in NAD Head:  Normocephalic and atraumatic. Neck:  Supple; no masses or  thyromegaly. Lungs:  Clear throughout to auscultation.    Heart:  Regular rate and rhythm. Abdomen:  Soft, nontender and nondistended. Normal bowel sounds, without guarding, and without rebound.   Neurologic:  Alert and  oriented x4;  grossly normal neurologically.  Impression/Plan: Molly [name] is here for an colonoscopy to be performed for Screening colonoscopy average risk    Risks, benefits, limitations, and alternatives regarding  colonoscopy have been reviewed with the patient.  Questions have been answered.  All parties agreeable.   [name], MD  01/08/2017, 8:00 AM

## 2017-01-08 NOTE — Op Note (Signed)
[facility] Gastroenterology Patient Name: Aliea Behrns Procedure Date: 01/08/2017 7:38 AM MRN: [id number] Account #: [id number] Date of Birth: 08-15-1951 Admit Type: Outpatient Age: 66 Room: [facility] ENDO ROOM 4 Gender: Female Note Status: Finalized Procedure:            Colonoscopy Indications:          Screening for colorectal malignant neoplasm Providers:            [name] MD, MD Referring MD:         Bethena Roys. Sowles, MD (Referring MD) Medicines:            Monitored Anesthesia Care Complications:        No immediate complications. Procedure:            Pre-Anesthesia Assessment:                       - Prior to the procedure, a History and Physical was                        performed, and patient medications, allergies and                        sensitivities were reviewed. The patient's tolerance of                        previous anesthesia was reviewed.                       - The risks and benefits of the procedure and the                        sedation options and risks were discussed with the                        patient. All questions were answered and informed                        consent was obtained.                       - The risks and benefits of the procedure and the                        sedation options and risks were discussed with the                        patient. All questions were answered and informed                        consent was obtained.                       - ASA Grade Assessment: III - A patient with severe                        systemic disease.                       After obtaining informed consent, the colonoscope was  passed under direct vision. Throughout the procedure,                        the patient's blood pressure, pulse, and oxygen                        saturations were monitored continuously. The                        Colonoscope was introduced through the anus and               advanced to the the cecum, identified by the                        appendiceal orifice, IC valve and transillumination.                        The colonoscopy was performed with ease. The patient                        tolerated the procedure well. The quality of the bowel                        preparation was excellent. Findings:      The perianal and digital rectal examinations were normal.      A 5 mm polyp was found in the transverse colon. The polyp was sessile.       The polyp was removed with a cold snare. Resection and retrieval were       complete.      A 15 mm polyp was found in the rectum. The polyp was semi-pedunculated.       The polyp was removed with a hot snare. Resection and retrieval were       complete.      Two sessile polyps were found in the sigmoid colon. The polyps were 3 to       5 mm in size. These polyps were removed with a cold snare. Resection and       retrieval were complete. No biopsies or other specimens were collected       for this exam. The two sigmoid colon polyp specimens were not retrieved.      Non-bleeding internal hemorrhoids were found during retroflexion. The       hemorrhoids were medium-sized.      A 6 mm polyp was found in the sigmoid colon. The polyp was sessile. The       polyp was seen on scope insertion but on withdrawal when I aimed to       resect the polyp , could not locate it despite multiple attempts to       rescan the area. Impression:           - One 5 mm polyp in the transverse colon, removed with                        a cold snare. Resected and retrieved.                       - One 15 mm polyp in the rectum, removed with a hot  snare. Resected and retrieved.                       - Two 3 to 5 mm polyps in the sigmoid colon, removed                        with a cold snare. Resected and retrieved. No specimens                        collected.                       - Non-bleeding internal  hemorrhoids.                       - One 6 mm polyp in the sigmoid colon. Recommendation:       - Patient has a contact number available for                        emergencies. The signs and symptoms of potential                        delayed complications were discussed with the patient.                        Return to normal activities tomorrow. Written discharge                        instructions were provided to the patient.                       - Resume previous diet.                       - Continue present medications.                       - Await pathology results.                       - Repeat colonoscopy in 1 year for surveillance. Procedure Code(s):    --- Professional ---                       [phone], Colonoscopy, flexible; with removal of tumor(s),                        polyp(s), or other lesion(s) by snare technique Diagnosis Code(s):    --- Professional ---                       Z12.11, Encounter for screening for malignant neoplasm                        of colon                       D12.3, Benign neoplasm of transverse colon (hepatic                        flexure or splenic flexure)  K62.1, Rectal polyp                       D12.5, Benign neoplasm of sigmoid colon                       K64.8, Other hemorrhoids CPT copyright 2016 American Medical Association. All rights reserved. The codes documented in this report are preliminary and upon coder review may  be revised to meet current compliance requirements. [name], MD [name] MD, MD 01/08/2017 8:33:00 AM This report has been signed electronically. Number of Addenda: 0 Note Initiated On: 01/08/2017 7:38 AM Scope Withdrawal Time: 0 hours 15 minutes 26 seconds  Total Procedure Duration: 0 hours 21 minutes 26 seconds       [name]

## 2017-01-08 NOTE — Transfer of Care (Signed)
Immediate Anesthesia Transfer of Care Note  Patient: Molly [name]  Procedure(s) Performed: Procedure(s): COLONOSCOPY WITH PROPOFOL (N/A)  Patient Location: PACU and Endoscopy Unit  Anesthesia Type:General  Level of Consciousness: sedated  Airway & Oxygen Therapy: Patient Spontanous Breathing and Patient connected to nasal cannula oxygen  Post-op Assessment: Report given to RN and Post -op Vital signs reviewed and stable  Post vital signs: Reviewed and stable  Last Vitals:  Vitals:   01/08/17 0756  BP: 124/60  Pulse: 66  Resp: 18  Temp: (!) 36 C    Last Pain: There were no vitals filed for this visit.       Complications: No apparent anesthesia complications

## 2017-01-08 NOTE — Anesthesia Preprocedure Evaluation (Signed)
Anesthesia Evaluation  Patient identified by MRN, date of birth, ID band Patient awake    Reviewed: Allergy & Precautions, H&P , NPO status , Patient's Chart, lab work & pertinent test results, reviewed documented beta blocker date and time   Airway Mallampati: III   Neck ROM: full    Dental  (+) Poor Dentition, Teeth Intact   Pulmonary neg pulmonary ROS, neg shortness of breath, asthma , neg sleep apnea, neg recent URI,    Pulmonary exam normal        Cardiovascular hypertension, On Medications negative cardio ROS Normal cardiovascular exam Rhythm:regular Rate:Normal     Neuro/Psych CVA, No Residual Symptoms negative neurological ROS  negative psych ROS   GI/Hepatic negative GI ROS, Neg liver ROS,   Endo/Other  negative endocrine ROSMorbid obesity  Renal/GU negative Renal ROS  negative genitourinary   Musculoskeletal   Abdominal   Peds  Hematology negative hematology ROS (+)   Anesthesia Other Findings Past Medical History: No date: Asthma     Comment: history of asthma No date: Hypertension No date: Stroke [facility])     Comment: 1990 Past Surgical History: No date: CHOLECYSTECTOMY   Reproductive/Obstetrics negative OB ROS                             Anesthesia Physical Anesthesia Plan  ASA: III  Anesthesia Plan: General   Post-op Pain Management:    Induction:   Airway Management Planned:   Additional Equipment:   Intra-op Plan:   Post-operative Plan:   Informed Consent: I have reviewed the patients History and Physical, chart, labs and discussed the procedure including the risks, benefits and alternatives for the proposed anesthesia with the patient or authorized representative who has indicated his/her understanding and acceptance.   Dental Advisory Given  Plan Discussed with: CRNA  Anesthesia Plan Comments:         Anesthesia Quick Evaluation

## 2017-01-09 ENCOUNTER — Encounter: Payer: Self-pay | Admitting: Gastroenterology

## 2017-01-09 LAB — SURGICAL PATHOLOGY

## 2017-01-09 NOTE — Anesthesia Postprocedure Evaluation (Signed)
Anesthesia Post Note  Patient: Molly [name]  Procedure(s) Performed: Procedure(s) (LRB): COLONOSCOPY WITH PROPOFOL (N/A)  Patient location during evaluation: PACU Anesthesia Type: General Level of consciousness: awake and alert Pain management: pain level controlled Vital Signs Assessment: post-procedure vital signs reviewed and stable Respiratory status: spontaneous breathing, nonlabored ventilation, respiratory function stable and patient connected to nasal cannula oxygen Cardiovascular status: blood pressure returned to baseline and stable Postop Assessment: no signs of nausea or vomiting Anesthetic complications: no     Last Vitals:  Vitals:   01/08/17 0912 01/08/17 0920  BP:  124/62  Pulse: (!) 55 (!) 55  Resp: 15 17  Temp:      Last Pain:  Vitals:   01/09/17 0755  TempSrc:   PainSc: 0-No pain                 [name]

## 2017-01-12 ENCOUNTER — Encounter: Payer: Self-pay | Admitting: Gastroenterology

## 2017-01-12 NOTE — Progress Notes (Signed)
Repeat colonoscopy in 1 year, I thought I saw another tiny polyp during the procedure which I could not find on withdrawal.   Dr [name]  Gastroenterology/Hepatology Pager: [phone]

## 2017-01-16 ENCOUNTER — Other Ambulatory Visit: Payer: Self-pay

## 2017-01-16 DIAGNOSIS — I10 Essential (primary) hypertension: Secondary | ICD-10-CM

## 2017-01-16 MED ORDER — FUROSEMIDE 40 MG PO TABS: 40.0000 mg | ORAL_TABLET | Freq: Every day | ORAL | 0 refills | Status: DC

## 2017-01-16 NOTE — Telephone Encounter (Signed)
Patient requesting refill of Lasix to [facility].

## 2017-01-23 ENCOUNTER — Encounter: Payer: Self-pay | Admitting: Family Medicine

## 2017-01-23 ENCOUNTER — Ambulatory Visit (INDEPENDENT_AMBULATORY_CARE_PROVIDER_SITE_OTHER): Payer: Medicare Other | Admitting: Family Medicine

## 2017-01-23 ENCOUNTER — Other Ambulatory Visit: Payer: Self-pay | Admitting: Family Medicine

## 2017-01-23 VITALS — BP 124/68 | HR 76 | Temp 98.1°F | Resp 16 | Ht 59.0 in | Wt 226.6 lb

## 2017-01-23 DIAGNOSIS — Z8673 Personal history of transient ischemic attack (TIA), and cerebral infarction without residual deficits: Secondary | ICD-10-CM

## 2017-01-23 DIAGNOSIS — R5383 Other fatigue: Secondary | ICD-10-CM | POA: Diagnosis not present

## 2017-01-23 DIAGNOSIS — R7301 Impaired fasting glucose: Secondary | ICD-10-CM | POA: Diagnosis not present

## 2017-01-23 DIAGNOSIS — E785 Hyperlipidemia, unspecified: Secondary | ICD-10-CM | POA: Diagnosis not present

## 2017-01-23 DIAGNOSIS — H539 Unspecified visual disturbance: Secondary | ICD-10-CM | POA: Diagnosis not present

## 2017-01-23 DIAGNOSIS — E559 Vitamin D deficiency, unspecified: Secondary | ICD-10-CM | POA: Diagnosis not present

## 2017-01-23 DIAGNOSIS — R269 Unspecified abnormalities of gait and mobility: Secondary | ICD-10-CM | POA: Diagnosis not present

## 2017-01-23 DIAGNOSIS — Z0000 Encounter for general adult medical examination without abnormal findings: Secondary | ICD-10-CM | POA: Diagnosis not present

## 2017-01-23 LAB — CBC WITH DIFFERENTIAL/PLATELET
BASOS PCT: 0 %
Basophils Absolute: 0 cells/uL (ref 0–200)
EOS ABS: 75 {cells}/uL (ref 15–500)
Eosinophils Relative: 1 %
HCT: 37 % (ref 35.0–45.0)
HEMOGLOBIN: 12.1 g/dL (ref 11.7–15.5)
LYMPHS ABS: 3225 {cells}/uL (ref 850–3900)
Lymphocytes Relative: 43 %
MCH: 29.7 pg (ref 27.0–33.0)
MCHC: 32.7 g/dL (ref 32.0–36.0)
MCV: 90.7 fL (ref 80.0–100.0)
MONOS PCT: 6 %
MPV: 9.5 fL (ref 7.5–12.5)
Monocytes Absolute: 450 cells/uL (ref 200–950)
NEUTROS ABS: 3750 {cells}/uL (ref 1500–7800)
Neutrophils Relative %: 50 %
PLATELETS: 366 10*3/uL (ref 140–400)
RBC: 4.08 MIL/uL (ref 3.80–5.10)
RDW: 14.5 % (ref 11.0–15.0)
WBC: 7.5 10*3/uL (ref 3.8–10.8)

## 2017-01-23 LAB — VITAMIN B12: Vitamin B-12: 1001 pg/mL (ref 200–1100)

## 2017-01-23 LAB — TSH: TSH: 1.47 mIU/L

## 2017-01-23 NOTE — Progress Notes (Signed)
Name: Molly [name]   MRN: [id number]    DOB: [birth year]   Date:01/23/2017       Progress Note  Subjective  Chief Complaint  Chief Complaint  Patient presents with  . Eye Problem    pt stated that she has had 3 spells of dizziness since her colonoscopy a few weeks ago followed by nausea    HPI  Visual disturbances: she had three episodes over a course of 2 days ( started on 01/22) of visual disturbance ( difficulty focusing ) but no visual loss or associated  weakness, no spinning sensation. Lasts seconds, followed by nausea, no vomiting, but had one episode of diaphoresis associated with one of the episodes. She denies any associated chest pain, no tinnitus or hearing loss.  Previous stroke had symptoms right arm paresthesia.    Patient Active Problem List   Diagnosis Date Noted  . Perennial allergic rhinitis with seasonal variation 05/14/2016  . Asthma, mild intermittent, well-controlled 05/14/2016  . Hypertension goal BP (blood pressure) < 140/90 12/10/2015  . Cardiomyopathy due to hypertension ([place]) 12/10/2015  . History of CVA (cerebrovascular accident) 12/10/2015  . Hyperlipidemia LDL goal <70 12/10/2015  . Statin intolerance 12/10/2015    Past Surgical History:  Procedure Laterality Date  . CHOLECYSTECTOMY    . COLONOSCOPY WITH PROPOFOL N/A 01/08/2017   Procedure: COLONOSCOPY WITH PROPOFOL;  Surgeon: [name], MD;  Location: [facility] ENDOSCOPY;  Service: Endoscopy;  Laterality: N/A;    Family History  Problem Relation Age of Onset  . Gout Father   . Asthma Father   . Asthma Brother   . Dementia Brother   . Stroke Brother     Social History   Social History  . Marital status: Divorced    Spouse name: N/A  . Number of children: N/A  . Years of education: N/A   Occupational History  . Not on file.   Social History Main Topics  . Smoking status: Never Smoker  . Smokeless tobacco: Never Used  . Alcohol use No  . Drug use: No  . Sexual activity: No   Other  Topics Concern  . Not on file   Social History Narrative  . No narrative on file     Current Outpatient Prescriptions:  .  acetaminophen (TYLENOL) 500 MG tablet, Take 1 tablet (500 mg total) by mouth every 6 (six) hours as needed., Disp: 30 tablet, Rfl: 0 .  albuterol (PROVENTIL HFA;VENTOLIN HFA) 108 (90 Base) MCG/ACT inhaler, Inhale 2 puffs into the lungs every 4 (four) hours as needed for wheezing or shortness of breath., Disp: 1 Inhaler, Rfl: 0 .  cetirizine (ZYRTEC) 10 MG tablet, Take 1 tablet (10 mg total) by mouth daily., Disp: 30 tablet, Rfl: 0 .  Cholecalciferol (VITAMIN D3) 1000 units CAPS, Take by mouth as needed., Disp: , Rfl:  .  diclofenac sodium (VOLTAREN) 1 % GEL, Apply 2 g topically 4 (four) times daily., Disp: , Rfl:  .  Elastic Bandages & Supports ([place]) MISC, 1 each by Does not apply route daily. Dx right lower extremity edema 20-30 pressure, Disp: 2 each, Rfl: 2 .  furosemide (LASIX) 40 MG tablet, Take 1 tablet (40 mg total) by mouth daily., Disp: 30 tablet, Rfl: 0 .  GARLIC PO, Take by mouth., Disp: , Rfl:  .  montelukast (SINGULAIR) 10 MG tablet, Take 1 tablet (10 mg total) by mouth at bedtime., Disp: 30 tablet, Rfl: 0 .  olmesartan-hydrochlorothiazide (BENICAR HCT) 40-12.5 MG tablet, Take  1 tablet by mouth daily., Disp: , Rfl:   Allergies  Allergen Reactions  . Nsaids Hives and Rash  . Statins Other (See Comments)    Joint pains     ROS  Constitutional: Negative for fever or weight change.  Respiratory: Negative for cough and shortness of breath.   Cardiovascular: Negative for chest pain or palpitations.  Gastrointestinal: Negative for abdominal pain, no bowel changes.  Musculoskeletal: Negative for gait problem or joint swelling.  Skin: Negative for rash.  Neurological: Negative for dizziness or headache.  No other specific complaints in a complete review of systems (except as listed in HPI above).  Objective  Vitals:    01/23/17 0801  BP: 124/68  Pulse: 76  Resp: 16  Temp: 98.1 F (36.7 C)  SpO2: 96%  Weight: 226 lb 9 oz (102.8 kg)  Height: 4\' 11"  (1.499 m)    Body mass index is 45.76 kg/m.  Physical Exam  Constitutional: Patient appears well-developed and well-nourished. Obese No distress.  HEENT: head atraumatic, normocephalic, pupils equal and reactive to light, ears normal TM, neck supple, throat within normal limits Cardiovascular: Normal rate, regular rhythm and normal heart sounds.  No murmur heard. 2 plus BLE edema. Pulmonary/Chest: Effort normal and breath sounds normal. No respiratory distress. Abdominal: Soft.  There is no tenderness. Psychiatric: Patient has a normal mood and affect. behavior is normal. Judgment and thought content normal. Neurological : Romberg negative, normal sensation, no nystagmus, cranial nerves normal   Recent Results (from the past 2160 hour(s))  Surgical pathology     Status: None   Collection Time: 01/08/17  8:12 AM  Result Value Ref Range   SURGICAL PATHOLOGY      Surgical Pathology CASE: [id number] PATIENT: Molly [name] Surgical Pathology Report     SPECIMEN SUBMITTED: A. Colon polyp, transverse; cold snare B. Rectum polyp; hot snare  CLINICAL HISTORY: None provided  PRE-OPERATIVE DIAGNOSIS: Screening colonoscopy Z12.11  POST-OPERATIVE DIAGNOSIS: Colon polyps     DIAGNOSIS: A. COLON POLYP, TRANSVERSE; COLD SNARE: - TUBULAR ADENOMA. - NEGATIVE FOR HIGH GRADE DYSPLASIA AND MALIGNANCY.  B. RECTUM POLYP; HOT SNARE: - TUBULOVILLOUS ADENOMA. - CAUTERIZED BASE FREE OF DYSPLASIA. - NEGATIVE FOR HIGH-GRADE DYSPLASIA AND MALIGNANCY.   GROSS DESCRIPTION:  A. Labeled: C snare transverse colon polyp  Tissue fragment(s): 1  Size: 0.2 cm  Description: pink-tan fragment  Entirely submitted in one cassette(s).   B. Labeled: H snare rectal polyp  Tissue fragment(s): 1  Size: 1.0 x 1.0 x 0.5 cm  Description: pink polypoid  fragment, inked blue at the base, bisected  Entirely submitted in 1 cassette(s).        Final Diagnosis performed by Quay Burow, MD.  Electronically signed 01/09/2017 9:59:21AM    The electronic signature indicates that the named Attending Pathologist has evaluated the specimen  Technical component performed at [facility], [address] [place], [place] [address] Lab: [phone] Dir: Darrick Penna. Evette Doffing, MD  Professional component performed at [facility], [facility], [place], [place], [place] [address] Lab: [phone] Dir: Dellia Nims. Rubinas, MD        PHQ2/9: Depression screen [facility] 2/9 11/11/2016 05/14/2016 02/13/2016 12/10/2015  Decreased Interest 0 0 0 0  Down, Depressed, Hopeless 0 0 0 0  PHQ - 2 Score 0 0 0 0     Fall Risk: Fall Risk  11/11/2016 05/14/2016 02/13/2016 12/10/2015  Falls in the past year? No No No No     Assessment & Plan  1. History of  CVA (cerebrovascular accident)  - MR Brain Wo Contrast; Future  2. Visual changes  - MR Brain Wo Contrast; Future If normal evaluation we will refer her to ophthalmologist  She will have labs that ordered in Nov today, consider carotid doppler  3. Morbid obesity ([place])  - Hemoglobin A1c - Insulin, fasting

## 2017-01-24 LAB — COMPLETE METABOLIC PANEL WITH GFR
ALT: 11 U/L (ref 6–29)
AST: 18 U/L (ref 10–35)
Albumin: 4.2 g/dL (ref 3.6–5.1)
Alkaline Phosphatase: 64 U/L (ref 33–130)
BILIRUBIN TOTAL: 0.5 mg/dL (ref 0.2–1.2)
BUN: 11 mg/dL (ref 7–25)
CHLORIDE: 104 mmol/L (ref 98–110)
CO2: 24 mmol/L (ref 20–31)
Calcium: 9.6 mg/dL (ref 8.6–10.4)
Creat: 0.78 mg/dL (ref 0.50–0.99)
GFR, EST NON AFRICAN AMERICAN: 80 mL/min (ref >=60)
Glucose, Bld: 102 mg/dL — ABNORMAL HIGH (ref 65–99)
POTASSIUM: 3.6 mmol/L (ref 3.5–5.3)
Sodium: 141 mmol/L (ref 135–146)
TOTAL PROTEIN: 6.8 g/dL (ref 6.1–8.1)

## 2017-01-24 LAB — LIPID PANEL
CHOL/HDL RATIO: 3.9 ratio (ref <5.0)
CHOLESTEROL: 235 mg/dL — AB (ref <200)
HDL: 61 mg/dL (ref >50)
LDL Cholesterol: 146 mg/dL — ABNORMAL HIGH (ref <100)
Triglycerides: 140 mg/dL (ref <150)
VLDL: 28 mg/dL (ref <30)

## 2017-01-24 LAB — VITAMIN D 25 HYDROXY (VIT D DEFICIENCY, FRACTURES): Vit D, 25-Hydroxy: 38 ng/mL (ref 30–100)

## 2017-01-24 LAB — HEMOGLOBIN A1C
Hgb A1c MFr Bld: 6 % — ABNORMAL HIGH (ref <5.7)
Mean Plasma Glucose: 126 mg/dL

## 2017-01-24 LAB — HEPATITIS C ANTIBODY: HCV Ab: NEGATIVE

## 2017-01-24 LAB — INSULIN, FASTING: Insulin fasting, serum: 24 u[IU]/mL — ABNORMAL HIGH (ref 2.0–19.6)

## 2017-01-29 ENCOUNTER — Ambulatory Visit (INDEPENDENT_AMBULATORY_CARE_PROVIDER_SITE_OTHER): Payer: Medicare Other | Admitting: Family Medicine

## 2017-01-29 ENCOUNTER — Encounter: Payer: Self-pay | Admitting: Family Medicine

## 2017-01-29 ENCOUNTER — Other Ambulatory Visit: Payer: Self-pay

## 2017-01-29 DIAGNOSIS — Z789 Other specified health status: Secondary | ICD-10-CM

## 2017-01-29 DIAGNOSIS — E785 Hyperlipidemia, unspecified: Secondary | ICD-10-CM | POA: Diagnosis not present

## 2017-01-29 DIAGNOSIS — E8881 Metabolic syndrome: Secondary | ICD-10-CM

## 2017-01-29 MED ORDER — LIRAGLUTIDE 18 MG/3ML ~~LOC~~ SOPN: 0.6000 mg | PEN_INJECTOR | SUBCUTANEOUS | 5 refills | Status: DC

## 2017-01-29 MED ORDER — INSULIN PEN NEEDLE 30G X 8 MM MISC: 1.0000 | Freq: Every day | 2 refills | Status: DC

## 2017-01-29 MED ORDER — CO Q 10 100 MG PO CAPS
1.0000 | ORAL_CAPSULE | Freq: Every day | ORAL | 5 refills | Status: DC
Start: 2017-01-29 — End: 2017-10-22

## 2017-01-29 MED ORDER — ROSUVASTATIN CALCIUM 10 MG PO TABS: 5.0000 mg | ORAL_TABLET | Freq: Every day | ORAL | 5 refills | Status: DC

## 2017-01-29 NOTE — Addendum Note (Signed)
Addended by: Loistine Chance on: 01/29/2017 08:44 AM   Modules accepted: Orders

## 2017-01-29 NOTE — Progress Notes (Addendum)
Name: Molly [name]   MRN: [id number]    DOB: 07-16-1951   Date:01/29/2017       Progress Note  Subjective  Chief Complaint  Chief Complaint  Patient presents with  . Follow-up    review lab     HPI  Insulin Resistance: lab results showed hgbA1C of 6.0% and fasting insulin of 24. She has obesity and is willing to be treated for insulin resistance. We discussed options and she is willing to try Victoza. She denies family history of thyroid cancer. Discussed possible side effects of medication. Mostly nausea and the importance of eating smaller portions. She denies polyphagia, polydipsia or polyuria at this time. She has nocturia at times  Dyslipidemia: she states she could not tolerate Lipitor in the past, never tried Crestor, we will try lower dose of Crestor with Co-Q10.   Morbid obesity: she states she has always been obese, since childhood. But worse since her 66's. She drinks sweet beverages once a day - a bottle of soda daily - regular coke. She likes to [employer], but she avoid desserts. She is willing to stop sodas, and will try substituting some of the startches  Patient Active Problem List   Diagnosis Date Noted  . Perennial allergic rhinitis with seasonal variation 05/14/2016  . Asthma, mild intermittent, well-controlled 05/14/2016  . Hypertension goal BP (blood pressure) < 140/90 12/10/2015  . Cardiomyopathy due to hypertension ([place]) 12/10/2015  . History of CVA (cerebrovascular accident) 12/10/2015  . Hyperlipidemia LDL goal <70 12/10/2015  . Statin intolerance 12/10/2015    Past Surgical History:  Procedure Laterality Date  . CHOLECYSTECTOMY    . COLONOSCOPY WITH PROPOFOL N/A 01/08/2017   Procedure: COLONOSCOPY WITH PROPOFOL;  Surgeon: [name], MD;  Location: [facility] ENDOSCOPY;  Service: Endoscopy;  Laterality: N/A;    Family History  Problem Relation Age of Onset  . Gout Father   . Asthma Father   . Asthma Brother   . Dementia Brother   . Stroke  Brother     Social History   Social History  . Marital status: Divorced    Spouse name: N/A  . Number of children: N/A  . Years of education: N/A   Occupational History  . Not on file.   Social History Main Topics  . Smoking status: Never Smoker  . Smokeless tobacco: Never Used  . Alcohol use No  . Drug use: No  . Sexual activity: No   Other Topics Concern  . Not on file   Social History Narrative  . No narrative on file     Current Outpatient Prescriptions:  .  acetaminophen (TYLENOL) 500 MG tablet, Take 1 tablet (500 mg total) by mouth every 6 (six) hours as needed., Disp: 30 tablet, Rfl: 0 .  albuterol (PROVENTIL HFA;VENTOLIN HFA) 108 (90 Base) MCG/ACT inhaler, Inhale 2 puffs into the lungs every 4 (four) hours as needed for wheezing or shortness of breath., Disp: 1 Inhaler, Rfl: 0 .  cetirizine (ZYRTEC) 10 MG tablet, Take 1 tablet (10 mg total) by mouth daily., Disp: 30 tablet, Rfl: 0 .  Cholecalciferol (VITAMIN D3) 1000 units CAPS, Take by mouth as needed., Disp: , Rfl:  .  diclofenac sodium (VOLTAREN) 1 % GEL, Apply 2 g topically 4 (four) times daily., Disp: , Rfl:  .  Elastic Bandages & Supports ([place]) MISC, 1 each by Does not apply route daily. Dx right lower extremity edema 20-30 pressure, Disp: 2 each, Rfl: 2 .  furosemide (LASIX) 40 MG tablet, Take 1 tablet (40 mg total) by mouth daily., Disp: 30 tablet, Rfl: 0 .  GARLIC PO, Take by mouth., Disp: , Rfl:  .  montelukast (SINGULAIR) 10 MG tablet, Take 1 tablet (10 mg total) by mouth at bedtime., Disp: 30 tablet, Rfl: 0 .  olmesartan-hydrochlorothiazide (BENICAR HCT) 40-12.5 MG tablet, Take 1 tablet by mouth daily., Disp: , Rfl:  .  Coenzyme Q10 (CO Q 10) 100 MG CAPS, Take 1 capsule by mouth daily., Disp: 30 capsule, Rfl: 5 .  liraglutide (VICTOZA) 18 MG/3ML SOPN, Inject 0.1-0.3 mLs (0.6-1.8 mg total) into the skin every morning., Disp: 9 mL, Rfl: 5 .  rosuvastatin (CRESTOR) 10 MG tablet,  Take 0.5-1 tablets (5-10 mg total) by mouth daily., Disp: 30 tablet, Rfl: 5  Allergies  Allergen Reactions  . Nsaids Hives and Rash  . Statins Other (See Comments)    Joint pains     ROS  Constitutional: Negative for fever, positive for mild  weight change.  Respiratory: Negative for cough and shortness of breath.   Cardiovascular: Negative for chest pain or palpitations.  Gastrointestinal: Negative for abdominal pain, no bowel changes.  Musculoskeletal: Negative for gait problem or joint swelling.  Skin: Negative for rash.  Neurological: Negative for dizziness or headache.  No other specific complaints in a complete review of systems (except as listed in HPI above).  Objective  Vitals:   01/29/17 0810  BP: 128/80  Pulse: 81  Temp: 98.2 F (36.8 C)  TempSrc: Oral  SpO2: 93%  Weight: 223 lb 9.6 oz (101.4 kg)  Height: 4' 11.06" (1.5 m)    Body mass index is 45.08 kg/m.  Physical Exam  Constitutional: Patient appears well-developed and well-nourished. Obese No distress.  HEENT: head atraumatic, normocephalic, pupils equal and reactive to light, , neck supple, throat within normal limits Cardiovascular: Normal rate, regular rhythm and normal heart sounds.  No murmur heard.Trace  BLE edema. Pulmonary/Chest: Effort normal and breath sounds normal. No respiratory distress. Abdominal: Soft.  There is no tenderness. Psychiatric: Patient has a normal mood and affect. behavior is normal. Judgment and thought content normal.  Recent Results (from the past 2160 hour(s))  Surgical pathology     Status: None   Collection Time: 01/08/17  8:12 AM  Result Value Ref Range   SURGICAL PATHOLOGY      Surgical Pathology CASE: [id number] PATIENT: Kamaree Cales Surgical Pathology Report     SPECIMEN SUBMITTED: A. Colon polyp, transverse; cold snare B. Rectum polyp; hot snare  CLINICAL HISTORY: None provided  PRE-OPERATIVE DIAGNOSIS: Screening colonoscopy  Z12.11  POST-OPERATIVE DIAGNOSIS: Colon polyps     DIAGNOSIS: A. COLON POLYP, TRANSVERSE; COLD SNARE: - TUBULAR ADENOMA. - NEGATIVE FOR HIGH GRADE DYSPLASIA AND MALIGNANCY.  B. RECTUM POLYP; HOT SNARE: - TUBULOVILLOUS ADENOMA. - CAUTERIZED BASE FREE OF DYSPLASIA. - NEGATIVE FOR HIGH-GRADE DYSPLASIA AND MALIGNANCY.   GROSS DESCRIPTION:  A. Labeled: C snare transverse colon polyp  Tissue fragment(s): 1  Size: 0.2 cm  Description: pink-tan fragment  Entirely submitted in one cassette(s).   B. Labeled: H snare rectal polyp  Tissue fragment(s): 1  Size: 1.0 x 1.0 x 0.5 cm  Description: pink polypoid fragment, inked blue at the base, bisected  Entirely submitted in 1 cassette(s).        Final Diagnosis performed by Quay Burow, MD.  Electronically signed 01/09/2017 9:59:21AM    The electronic signature indicates that the named Attending Pathologist has evaluated the specimen  Technical component  performed at [facility], [address], [place], [place] [address] Lab: [phone] Dir: Titus Dubin. Cato Mulligan, MD  Professional component performed at [facility], [facility], [address] [place], [place], [place] [address] Lab: [phone] Dir: Georgiann Cocker. Rubinas, MD    Hemoglobin A1c     Status: Abnormal   Collection Time: 01/23/17  8:27 AM  Result Value Ref Range   Hgb A1c MFr Bld 6.0 (H) <5.7 %    Comment:   For someone without known diabetes, a hemoglobin A1c value between 5.7% and 6.4% is consistent with prediabetes and should be confirmed with a follow-up test.   For someone with known diabetes, a value <7% indicates that their diabetes is well controlled. A1c targets should be individualized based on duration of diabetes, age, co-morbid conditions and other considerations.   This assay result is consistent with an increased risk of diabetes.   Currently, no consensus exists regarding use of hemoglobin A1c for diagnosis of diabetes in  children.      Mean Plasma Glucose 126 mg/dL  Insulin, fasting     Status: Abnormal   Collection Time: 01/23/17  8:27 AM  Result Value Ref Range   Insulin fasting, serum 24.0 (H) 2.0 - 19.6 uIU/mL    Comment:   This insulin assay shows strong cross-reactivity for some insulin analogs (lispro, aspart, and glargine) and much lower cross-reactivity with others (detemir, glulisine).   Stimulated Insulin reference intervals were established using the Siemens Immulite assay. These values are provided for general guidance only.   COMPLETE METABOLIC PANEL WITH GFR     Status: Abnormal   Collection Time: 01/23/17  8:29 AM  Result Value Ref Range   Sodium 141 135 - 146 mmol/L   Potassium 3.6 3.5 - 5.3 mmol/L   Chloride 104 98 - 110 mmol/L   CO2 24 20 - 31 mmol/L   Glucose, Bld 102 (H) 65 - 99 mg/dL   BUN 11 7 - 25 mg/dL   Creat 5.78 4.69 - 6.29 mg/dL    Comment:   For patients > or = 66 years of age: The upper reference limit for Creatinine is approximately 13% higher for people identified as African-American.      Total Bilirubin 0.5 0.2 - 1.2 mg/dL   Alkaline Phosphatase 64 33 - 130 U/L   AST 18 10 - 35 U/L   ALT 11 6 - 29 U/L   Total Protein 6.8 6.1 - 8.1 g/dL   Albumin 4.2 3.6 - 5.1 g/dL   Calcium 9.6 8.6 - 52.8 mg/dL   GFR, Est African American >89 >=60 mL/min   GFR, Est Non African American 80 >=60 mL/min  CBC with Differential/Platelet     Status: None   Collection Time: 01/23/17  8:29 AM  Result Value Ref Range   WBC 7.5 3.8 - 10.8 K/uL   RBC 4.08 3.80 - 5.10 MIL/uL   Hemoglobin 12.1 11.7 - 15.5 g/dL   HCT 41.3 24.4 - 01.0 %   MCV 90.7 80.0 - 100.0 fL   MCH 29.7 27.0 - 33.0 pg   MCHC 32.7 32.0 - 36.0 g/dL   RDW 27.2 53.6 - 64.4 %   Platelets 366 140 - 400 K/uL   MPV 9.5 7.5 - 12.5 fL   Neutro Abs 3,750 1,500 - 7,800 cells/uL   Lymphs Abs 3,225 850 - 3,900 cells/uL   Monocytes Absolute 450 200 - 950 cells/uL   Eosinophils Absolute 75 15 - 500 cells/uL    Basophils Absolute  0 0 - 200 cells/uL   Neutrophils Relative % 50 %   Lymphocytes Relative 43 %   Monocytes Relative 6 %   Eosinophils Relative 1 %   Basophils Relative 0 %   Smear Review Criteria for review not met   Lipid panel     Status: Abnormal   Collection Time: 01/23/17  8:29 AM  Result Value Ref Range   Cholesterol 235 (H) <200 mg/dL   Triglycerides 140 <150 mg/dL   HDL 61 >50 mg/dL   Total CHOL/HDL Ratio 3.9 <5.0 Ratio   VLDL 28 <30 mg/dL   LDL Cholesterol 146 (H) <100 mg/dL  TSH     Status: None   Collection Time: 01/23/17  8:29 AM  Result Value Ref Range   TSH 1.47 mIU/L    Comment:   Reference Range   > or = 20 Years  0.40-4.50   Pregnancy Range First trimester  0.26-2.66 Second trimester 0.55-2.73 Third trimester  0.43-2.91     Vitamin B12     Status: None   Collection Time: 01/23/17  8:29 AM  Result Value Ref Range   Vitamin B-12 1,001 200 - 1,100 pg/mL  Hepatitis C antibody     Status: None   Collection Time: 01/23/17  8:29 AM  Result Value Ref Range   HCV Ab NEGATIVE NEGATIVE  VITAMIN D 25 Hydroxy (Vit-D Deficiency, Fractures)     Status: None   Collection Time: 01/23/17  8:29 AM  Result Value Ref Range   Vit D, 25-Hydroxy 38 30 - 100 ng/mL    Comment: Vitamin D Status           25-OH Vitamin D        Deficiency                <20 ng/mL        Insufficiency         20 - 29 ng/mL        Optimal             > or = 30 ng/mL   For 25-OH Vitamin D testing on patients on D2-supplementation and patients for whom quantitation of D2 and D3 fractions is required, the QuestAssureD 25-OH VIT D, (D2,D3), LC/MS/MS is recommended: order code [id number] (patients > 2 yrs).       PHQ2/9: Depression screen [facility] 2/9 11/11/2016 05/14/2016 02/13/2016 12/10/2015  Decreased Interest 0 0 0 0  Down, Depressed, Hopeless 0 0 0 0  PHQ - 2 Score 0 0 0 0     Fall Risk: Fall Risk  11/11/2016 05/14/2016 02/13/2016 12/10/2015  Falls in the past year? No No No No     Assessment & Plan  1. Morbid obesity ([place])  Discussed with the patient the risk posed by an increased BMI. Discussed importance of portion control, calorie counting and at least 150 minutes of physical activity weekly. Avoid sweet beverages and drink more water. Eat at least 6 servings of fruit and vegetables daily   2. Hyperlipidemia LDL goal <70  - rosuvastatin (CRESTOR) 10 MG tablet; Take 0.5-1 tablets (5-10 mg total) by mouth daily.  Dispense: 30 tablet; Refill: 5 - Coenzyme Q10 (CO Q 10) 100 MG CAPS; Take 1 capsule by mouth daily.  Dispense: 30 capsule; Refill: 5  3. Insulin resistance  - liraglutide (VICTOZA) 18 MG/3ML SOPN; Inject 0.1-0.3 mLs (0.6-1.8 mg total) into the skin every morning.  Dispense: 9 mL; Refill: 5  4. Statin intolerance  - Coenzyme  Q10 (CO Q 10) 100 MG CAPS; Take 1 capsule by mouth daily.  Dispense: 30 capsule; Refill: 5

## 2017-02-02 ENCOUNTER — Ambulatory Visit: Payer: Medicare Other

## 2017-02-02 ENCOUNTER — Ambulatory Visit
Admission: RE | Admit: 2017-02-02 | Discharge: 2017-02-02 | Disposition: A | Discharge status: Home / Self Care | Payer: Medicare Other | Source: Ambulatory Visit | Attending: Family Medicine | Admitting: Family Medicine

## 2017-02-02 DIAGNOSIS — M8588 Other specified disorders of bone density and structure, other site: Secondary | ICD-10-CM | POA: Insufficient documentation

## 2017-02-02 DIAGNOSIS — Z0000 Encounter for general adult medical examination without abnormal findings: Secondary | ICD-10-CM

## 2017-02-02 DIAGNOSIS — Z1231 Encounter for screening mammogram for malignant neoplasm of breast: Secondary | ICD-10-CM | POA: Diagnosis not present

## 2017-02-02 DIAGNOSIS — M85851 Other specified disorders of bone density and structure, right thigh: Secondary | ICD-10-CM | POA: Insufficient documentation

## 2017-02-03 ENCOUNTER — Other Ambulatory Visit: Payer: Self-pay

## 2017-02-03 DIAGNOSIS — E8881 Metabolic syndrome: Secondary | ICD-10-CM

## 2017-02-03 DIAGNOSIS — I10 Essential (primary) hypertension: Secondary | ICD-10-CM

## 2017-02-03 MED ORDER — INSULIN PEN NEEDLE 30G X 8 MM MISC: 1.0000 | Freq: Every day | 2 refills | Status: DC

## 2017-02-03 MED ORDER — MONTELUKAST SODIUM 10 MG PO TABS
10.0000 mg | ORAL_TABLET | Freq: Every day | ORAL | 0 refills | Status: DC
Start: 2017-02-03 — End: 2017-03-18

## 2017-02-03 MED ORDER — FUROSEMIDE 40 MG PO TABS: 40.0000 mg | ORAL_TABLET | Freq: Every day | ORAL | 0 refills | Status: DC

## 2017-02-06 ENCOUNTER — Ambulatory Visit
Admission: RE | Admit: 2017-02-06 | Discharge: 2017-02-06 | Disposition: A | Discharge status: Home / Self Care | Payer: Medicare Other | Source: Ambulatory Visit | Attending: Family Medicine | Admitting: Family Medicine

## 2017-02-06 DIAGNOSIS — Z8673 Personal history of transient ischemic attack (TIA), and cerebral infarction without residual deficits: Secondary | ICD-10-CM | POA: Diagnosis not present

## 2017-02-06 DIAGNOSIS — H539 Unspecified visual disturbance: Secondary | ICD-10-CM | POA: Diagnosis not present

## 2017-02-06 DIAGNOSIS — I739 Peripheral vascular disease, unspecified: Secondary | ICD-10-CM | POA: Diagnosis not present

## 2017-02-18 ENCOUNTER — Other Ambulatory Visit: Payer: Self-pay

## 2017-02-18 DIAGNOSIS — E8881 Metabolic syndrome: Secondary | ICD-10-CM

## 2017-02-18 DIAGNOSIS — I10 Essential (primary) hypertension: Secondary | ICD-10-CM

## 2017-02-18 MED ORDER — FUROSEMIDE 40 MG PO TABS: 40.0000 mg | ORAL_TABLET | Freq: Every day | ORAL | 0 refills | Status: DC

## 2017-02-18 MED ORDER — INSULIN PEN NEEDLE 30G X 8 MM MISC: 1.0000 | Freq: Every day | 2 refills | Status: DC

## 2017-02-23 ENCOUNTER — Other Ambulatory Visit: Payer: Self-pay

## 2017-02-24 ENCOUNTER — Other Ambulatory Visit: Payer: Self-pay

## 2017-02-24 MED ORDER — PEN NEEDLES 32G X 4 MM MISC: 1.0000 | Freq: Every day | 5 refills | Status: DC

## 2017-02-24 NOTE — Progress Notes (Signed)
32

## 2017-03-16 ENCOUNTER — Other Ambulatory Visit: Payer: Self-pay | Admitting: Family Medicine

## 2017-03-16 ENCOUNTER — Telehealth: Payer: Self-pay

## 2017-03-16 DIAGNOSIS — R600 Localized edema: Secondary | ICD-10-CM

## 2017-03-16 NOTE — Telephone Encounter (Signed)
please make a referral to vascular

## 2017-03-18 ENCOUNTER — Other Ambulatory Visit: Payer: Self-pay

## 2017-03-18 DIAGNOSIS — J302 Other seasonal allergic rhinitis: Secondary | ICD-10-CM

## 2017-03-18 DIAGNOSIS — J3089 Other allergic rhinitis: Principal | ICD-10-CM

## 2017-03-18 NOTE — Telephone Encounter (Signed)
Patient requesting refill of Zytrec, Singular and Benicar to Medication Management.

## 2017-03-19 MED ORDER — MONTELUKAST SODIUM 10 MG PO TABS: 10.0000 mg | ORAL_TABLET | Freq: Every day | ORAL | 1 refills | Status: DC

## 2017-03-19 MED ORDER — CETIRIZINE HCL 10 MG PO TABS: 10.0000 mg | ORAL_TABLET | Freq: Every day | ORAL | 1 refills | Status: DC

## 2017-03-19 MED ORDER — OLMESARTAN MEDOXOMIL-HCTZ 40-12.5 MG PO TABS: 1.0000 | ORAL_TABLET | Freq: Every day | ORAL | 1 refills | Status: DC

## 2017-03-31 ENCOUNTER — Encounter (INDEPENDENT_AMBULATORY_CARE_PROVIDER_SITE_OTHER): Payer: Self-pay | Admitting: Vascular Surgery

## 2017-03-31 ENCOUNTER — Ambulatory Visit (INDEPENDENT_AMBULATORY_CARE_PROVIDER_SITE_OTHER): Payer: Medicare Other | Admitting: Vascular Surgery

## 2017-03-31 ENCOUNTER — Encounter (INDEPENDENT_AMBULATORY_CARE_PROVIDER_SITE_OTHER): Payer: Self-pay

## 2017-03-31 VITALS — BP 117/70 | HR 61 | Resp 16 | Ht 58.5 in | Wt 218.0 lb

## 2017-03-31 DIAGNOSIS — M79604 Pain in right leg: Secondary | ICD-10-CM

## 2017-03-31 DIAGNOSIS — E785 Hyperlipidemia, unspecified: Secondary | ICD-10-CM | POA: Diagnosis not present

## 2017-03-31 DIAGNOSIS — I10 Essential (primary) hypertension: Secondary | ICD-10-CM | POA: Diagnosis not present

## 2017-03-31 DIAGNOSIS — M79609 Pain in unspecified limb: Secondary | ICD-10-CM | POA: Insufficient documentation

## 2017-03-31 NOTE — Patient Instructions (Signed)

## 2017-03-31 NOTE — Assessment & Plan Note (Signed)
lipid control important in reducing the progression of atherosclerotic disease. Continue statin therapy  

## 2017-03-31 NOTE — Assessment & Plan Note (Signed)

## 2017-03-31 NOTE — Progress Notes (Signed)
Patient ID: Molly [name], female   DOB: 06/03/1951, 66 y.o.   MRN: [id number]  Chief Complaint  Patient presents with  . New Evaluation    Bilateral leg swelling, right    HPI Molly [name] is a 66 y.o. female.  I am asked to see the patient by Dr. Ancil Boozer for evaluation of leg pain and swelling.  The patient reports several months of worsening pain and swelling predominately in the right leg. There is no clear inciting event or causative factor started her symptoms. This has been associated with worsening discoloration of the right lower leg and swelling that is most predominant in the evening she really does not have any left leg symptoms currently. She denies previous trauma, surgery, or injury to this right leg. The leg feels heavy and tired. The discomfort is a fullness and aching discomfort. She does not describe activity as worsening the pain, and it does not help either. She does not have ulceration or infection. She denies fever or chills.   Past Medical History:  Diagnosis Date  . Asthma    history of asthma  . Hypertension   . Stroke [facility])    1990    Past Surgical History:  Procedure Laterality Date  . CHOLECYSTECTOMY    . COLONOSCOPY WITH PROPOFOL N/A 01/08/2017   Procedure: COLONOSCOPY WITH PROPOFOL;  Surgeon: [name], MD;  Location: [facility] ENDOSCOPY;  Service: Endoscopy;  Laterality: N/A;    Family History  Problem Relation Age of Onset  . Gout Father   . Asthma Father   . Asthma Brother   . Dementia Brother   . Stroke Brother   . Breast cancer Neg Hx     Social History Social History  Substance Use Topics  . Smoking status: Never Smoker  . Smokeless tobacco: Never Used  . Alcohol use No   No IV drug use   Allergies  Allergen Reactions  . Nsaids Hives and Rash  . Statins Other (See Comments)    Joint pains  . Crestor [Rosuvastatin Calcium] Rash    Current Outpatient Prescriptions  Medication Sig Dispense Refill  . acetaminophen (TYLENOL)  500 MG tablet Take 1 tablet (500 mg total) by mouth every 6 (six) hours as needed. 30 tablet 0  . albuterol (PROVENTIL HFA;VENTOLIN HFA) 108 (90 Base) MCG/ACT inhaler Inhale 2 puffs into the lungs every 4 (four) hours as needed for wheezing or shortness of breath. 1 Inhaler 0  . cetirizine (ZYRTEC) 10 MG tablet Take 1 tablet (10 mg total) by mouth daily. 90 tablet 1  . Cholecalciferol (VITAMIN D3) 1000 units CAPS Take by mouth as needed.    . Coenzyme Q10 (CO Q 10) 100 MG CAPS Take 1 capsule by mouth daily. 30 capsule 5  . diclofenac sodium (VOLTAREN) 1 % GEL Apply 2 g topically 4 (four) times daily.    Regino Schultze Bandages & Supports (MEDICAL COMPRESSION STOCKINGS) MISC 1 each by Does not apply route daily. Dx right lower extremity edema 20-30 pressure 2 each 2  . furosemide (LASIX) 40 MG tablet Take 1 tablet (40 mg total) by mouth daily. 30 tablet 0  . GARLIC PO Take by mouth.    . Insulin Pen Needle (NOVOFINE) 30G X 8 MM MISC Inject 10 each into the skin daily. 100 each 2  . Insulin Pen Needle (PEN NEEDLES) 32G X 4 MM MISC 1 Package by Does not apply route daily. 100 each 5  . liraglutide (VICTOZA) 18 MG/3ML  SOPN Inject 0.1-0.3 mLs (0.6-1.8 mg total) into the skin every morning. 9 mL 5  . montelukast (SINGULAIR) 10 MG tablet Take 1 tablet (10 mg total) by mouth at bedtime. 90 tablet 1  . olmesartan-hydrochlorothiazide (BENICAR HCT) 40-12.5 MG tablet Take 1 tablet by mouth daily. 90 tablet 1  . rosuvastatin (CRESTOR) 10 MG tablet      No current facility-administered medications for this visit.       REVIEW OF SYSTEMS (Negative unless checked)  Constitutional: [] Weight loss  [] Fever  [] Chills Cardiac: [] Chest pain   [] Chest pressure   [] Palpitations   [] Shortness of breath when laying flat   [] Shortness of breath at rest   [] Shortness of breath with exertion. Vascular:  [x] Pain in legs with walking   [x] Pain in legs at rest   [] Pain in legs when laying flat   [] Claudication   [] Pain in feet  when walking  [] Pain in feet at rest  [] Pain in feet when laying flat   [] History of DVT   [] Phlebitis   [x] Swelling in legs   [] Varicose veins   [] Non-healing ulcers Pulmonary:   [] Uses home oxygen   [] Productive cough   [] Hemoptysis   [] Wheeze  [] COPD   [] Asthma Neurologic:  [] Dizziness  [] Blackouts   [] Seizures   [] History of stroke   [] History of TIA  [] Aphasia   [] Temporary blindness   [] Dysphagia   [] Weakness or numbness in arms   [] Weakness or numbness in legs Musculoskeletal:  [] Arthritis   [] Joint swelling   [] Joint pain   [] Low back pain Hematologic:  [] Easy bruising  [] Easy bleeding   [] Hypercoagulable state   [] Anemic  [] Hepatitis Gastrointestinal:  [] Blood in stool   [] Vomiting blood  [] Gastroesophageal reflux/heartburn   [] Abdominal pain Genitourinary:  [] Chronic kidney disease   [] Difficult urination  [] Frequent urination  [] Burning with urination   [] Hematuria Skin:  [] Rashes   [] Ulcers   [] Wounds Psychological:  [] History of anxiety   []  History of major depression.    Physical Exam BP 117/70 (BP Location: Right Arm)   Pulse 61   Resp 16   Ht 4' 10.5" (1.486 m)   Wt 98.9 kg (218 lb)   BMI 44.79 kg/m  Gen:  WD/WN, NAD Head: [place]/AT, No temporalis wasting. Ear/Nose/Throat: Hearing grossly intact, nares w/o erythema or drainage, oropharynx w/o Erythema/Exudate Eyes: Conjunctiva clear, sclera non-icteric  Neck: trachea midline.  No JVD.  Pulmonary:  Good air movement, no use of accessory muscles, respirations not labored.  Cardiac: RRR Vascular:  Vessel Right Left  Radial Palpable Palpable  Ulnar Palpable Palpable  Brachial Palpable Palpable  Carotid Palpable, without bruit Palpable, without bruit  Aorta Not palpable N/A  Femoral Palpable Palpable  Popliteal Palpable Palpable  PT Trace Palpable 1+ Palpable  DP 1+ Palpable Palpable   Gastrointestinal: soft, non-tender/non-distended. Musculoskeletal: M/S 5/5 throughout.  Extremities without ischemic changes.  No  deformity or atrophy. 2+ RLE and trace LLE edema. Neurologic: Sensation grossly intact in extremities.  Symmetrical.  Speech is fluent. Motor exam as listed above. Psychiatric: Judgment intact, Mood & affect appropriate for pt's clinical situation. Dermatologic: No rashes or ulcers noted.  No cellulitis or open wounds.    Radiology No results found.  Labs Recent Results (from the past 2160 hour(s))  Surgical pathology     Status: None   Collection Time: 01/08/17  8:12 AM  Result Value Ref Range   SURGICAL PATHOLOGY      Surgical Pathology CASE: [id number] PATIENT: [facility]  Report     SPECIMEN SUBMITTED: A. Colon polyp, transverse; cold snare B. Rectum polyp; hot snare  CLINICAL HISTORY: None provided  PRE-OPERATIVE DIAGNOSIS: Screening colonoscopy Z12.11  POST-OPERATIVE DIAGNOSIS: Colon polyps     DIAGNOSIS: A. COLON POLYP, TRANSVERSE; COLD SNARE: - TUBULAR ADENOMA. - NEGATIVE FOR HIGH GRADE DYSPLASIA AND MALIGNANCY.  B. RECTUM POLYP; HOT SNARE: - TUBULOVILLOUS ADENOMA. - CAUTERIZED BASE FREE OF DYSPLASIA. - NEGATIVE FOR HIGH-GRADE DYSPLASIA AND MALIGNANCY.   GROSS DESCRIPTION:  A. Labeled: C snare transverse colon polyp  Tissue fragment(s): 1  Size: 0.2 cm  Description: pink-tan fragment  Entirely submitted in one cassette(s).   B. Labeled: H snare rectal polyp  Tissue fragment(s): 1  Size: 1.0 x 1.0 x 0.5 cm  Description: pink polypoid fragment, inked blue at the base, bisected  Entirely submitted in 1 cassette(s).        Final Diagnosis performed by Quay Burow, MD.  Electronically signed 01/09/2017 9:59:21AM    The electronic signature indicates that the named Attending Pathologist has evaluated the specimen  Technical component performed at [facility], [address], [place], [place] [address] Lab: [phone] Dir: Darrick Penna. Evette Doffing, MD  Professional component performed at [facility], [facility], [place], [place],  [address] Lab: [phone] Dir: Dellia Nims. Rubinas, MD    Hemoglobin A1c     Status: Abnormal   Collection Time: 01/23/17  8:27 AM  Result Value Ref Range   Hgb A1c MFr Bld 6.0 (H) <5.7 %    Comment:   For someone without known diabetes, a hemoglobin A1c value between 5.7% and 6.4% is consistent with prediabetes and should be confirmed with a follow-up test.   For someone with known diabetes, a value <7% indicates that their diabetes is well controlled. A1c targets should be individualized based on duration of diabetes, age, co-morbid conditions and other considerations.   This assay result is consistent with an increased risk of diabetes.   Currently, no consensus exists regarding use of hemoglobin A1c for diagnosis of diabetes in children.      Mean Plasma Glucose 126 mg/dL  Insulin, fasting     Status: Abnormal   Collection Time: 01/23/17  8:27 AM  Result Value Ref Range   Insulin fasting, serum 24.0 (H) 2.0 - 19.6 uIU/mL    Comment:   This insulin assay shows strong cross-reactivity for some insulin analogs (lispro, aspart, and glargine) and much lower cross-reactivity with others (detemir, glulisine).   Stimulated Insulin reference intervals were established using the Siemens Immulite assay. These values are provided for general guidance only.   COMPLETE METABOLIC PANEL WITH GFR     Status: Abnormal   Collection Time: 01/23/17  8:29 AM  Result Value Ref Range   Sodium 141 135 - 146 mmol/L   Potassium 3.6 3.5 - 5.3 mmol/L   Chloride 104 98 - 110 mmol/L   CO2 24 20 - 31 mmol/L   Glucose, Bld 102 (H) 65 - 99 mg/dL   BUN 11 7 - 25 mg/dL   Creat 0.78 0.50 - 0.99 mg/dL    Comment:   For patients > or = 66 years of age: The upper reference limit for Creatinine is approximately 13% higher for people identified as African-American.      Total Bilirubin 0.5 0.2 - 1.2 mg/dL   Alkaline Phosphatase 64 33 - 130 U/L   AST  18 10 - 35 U/L   ALT 11 6 - 29 U/L   Total Protein  6.8 6.1 - 8.1 g/dL   Albumin 4.2 3.6 - 5.1 g/dL   Calcium 9.6 8.6 - 10.4 mg/dL   GFR, Est African American >89 >=60 mL/min   GFR, Est Non African American 80 >=60 mL/min  CBC with Differential/Platelet     Status: None   Collection Time: 01/23/17  8:29 AM  Result Value Ref Range   WBC 7.5 3.8 - 10.8 K/uL   RBC 4.08 3.80 - 5.10 MIL/uL   Hemoglobin 12.1 11.7 - 15.5 g/dL   HCT 37.0 35.0 - 45.0 %   MCV 90.7 80.0 - 100.0 fL   MCH 29.7 27.0 - 33.0 pg   MCHC 32.7 32.0 - 36.0 g/dL   RDW 14.5 11.0 - 15.0 %   Platelets 366 140 - 400 K/uL   MPV 9.5 7.5 - 12.5 fL   Neutro Abs 3,750 1,500 - 7,800 cells/uL   Lymphs Abs 3,225 850 - 3,900 cells/uL   Monocytes Absolute 450 200 - 950 cells/uL   Eosinophils Absolute 75 15 - 500 cells/uL   Basophils Absolute 0 0 - 200 cells/uL   Neutrophils Relative % 50 %   Lymphocytes Relative 43 %   Monocytes Relative 6 %   Eosinophils Relative 1 %   Basophils Relative 0 %   Smear Review Criteria for review not met   Lipid panel     Status: Abnormal   Collection Time: 01/23/17  8:29 AM  Result Value Ref Range   Cholesterol 235 (H) <200 mg/dL   Triglycerides 140 <150 mg/dL   HDL 61 >50 mg/dL   Total CHOL/HDL Ratio 3.9 <5.0 Ratio   VLDL 28 <30 mg/dL   LDL Cholesterol 146 (H) <100 mg/dL  TSH     Status: None   Collection Time: 01/23/17  8:29 AM  Result Value Ref Range   TSH 1.47 mIU/L    Comment:   Reference Range   > or = 20 Years  0.40-4.50   Pregnancy Range First trimester  0.26-2.66 Second trimester 0.55-2.73 Third trimester  0.43-2.91     Vitamin B12     Status: None   Collection Time: 01/23/17  8:29 AM  Result Value Ref Range   Vitamin B-12 1,001 200 - 1,100 pg/mL  Hepatitis C antibody     Status: None   Collection Time: 01/23/17  8:29 AM  Result Value Ref Range   HCV Ab NEGATIVE NEGATIVE  VITAMIN D 25 Hydroxy (Vit-D Deficiency, Fractures)     Status: None   Collection Time: 01/23/17   8:29 AM  Result Value Ref Range   Vit D, 25-Hydroxy 38 30 - 100 ng/mL    Comment: Vitamin D Status           25-OH Vitamin D        Deficiency                <20 ng/mL        Insufficiency         20 - 29 ng/mL        Optimal             > or = 30 ng/mL   For 25-OH Vitamin D testing on patients on D2-supplementation and patients for whom quantitation of D2 and D3 fractions is required, the QuestAssureD 25-OH VIT D, (D2,D3), LC/MS/MS is recommended: order code [id number] (patients > 2 yrs).     Assessment/Plan:  Hypertension goal BP (blood pressure) < 140/90 blood pressure control important in reducing the  progression of atherosclerotic disease. On appropriate oral medications.   Hyperlipidemia LDL goal <70 lipid control important in reducing the progression of atherosclerotic disease. Continue statin therapy   Pain in limb  Recommend:  The patient has atypical pain symptoms for pure atherosclerotic disease. However, on physical exam there is evidence of mixed venous and arterial disease, given the diminished pulses and the edema associated with venous changes of the legs.  Noninvasive studies including ABI's and venous ultrasound of the legs will be obtained and the patient will follow up with me to review these studies.  The patient should continue walking and begin a more formal exercise program. The patient should continue his antiplatelet therapy and aggressive treatment of the lipid abnormalities.  The patient should begin wearing graduated compression socks 15-20 mmHg strength to control edema.       Leotis Pain 03/31/2017, 4:46 PM   This note was created with Dragon medical transcription system.  Any errors from dictation are unintentional.

## 2017-03-31 NOTE — Assessment & Plan Note (Signed)
blood pressure control important in reducing the progression of atherosclerotic disease. On appropriate oral medications.  

## 2017-04-01 ENCOUNTER — Telehealth (INDEPENDENT_AMBULATORY_CARE_PROVIDER_SITE_OTHER): Payer: Self-pay

## 2017-04-01 NOTE — Telephone Encounter (Signed)
Daughter called and stated that her Mom is not pleased with the referral appointment from yesterday and states that her Mom is in a lot of pain. Says that her Mom was only told to get compression stockings and was given nothing for pain. She also is upset that she is having to wait until June to have her ultrasounds done. She states that her Mom is complaining of most of her pain being in her calf area today since she started the compression stockings on yesterday.   She was advised to give her Mom Aleve since she can not have ibuprofen and says that the OTC Tylenol is not working or touching the pain, until something else is prescribed or another form of treatment is given to help with her pain. She was also advised to elevate, and to not sleep with the compression stockings on.

## 2017-04-01 NOTE — Telephone Encounter (Signed)
The patient can be placed on the cancellation list. I will write for some Tramadol for pain relief while she is waiting to undergo further testing. Compression and elevation is extremely important in managing and improving her symptoms.

## 2017-04-07 ENCOUNTER — Telehealth (INDEPENDENT_AMBULATORY_CARE_PROVIDER_SITE_OTHER): Payer: Self-pay | Admitting: Vascular Surgery

## 2017-04-07 NOTE — Telephone Encounter (Signed)
Daughter called to express that her mother was in a lot of pain and upset that her next appt was not until June. We worked through the schedule and offered her today 04/07/17 at 1:45 p.m. She said she would call back after she spoke with her mom. Markham Jordan called back at 12 to the patient as the daughter did not answer her phone. Patient states she could not come because her father has an appt today and her daughter knew that.

## 2017-04-22 ENCOUNTER — Other Ambulatory Visit: Payer: Self-pay

## 2017-04-22 DIAGNOSIS — I10 Essential (primary) hypertension: Secondary | ICD-10-CM

## 2017-04-22 MED ORDER — FUROSEMIDE 40 MG PO TABS: 40.0000 mg | ORAL_TABLET | Freq: Every day | ORAL | 0 refills | Status: DC

## 2017-05-06 ENCOUNTER — Ambulatory Visit: Payer: Medicare Other | Admitting: Family Medicine

## 2017-06-10 ENCOUNTER — Ambulatory Visit (INDEPENDENT_AMBULATORY_CARE_PROVIDER_SITE_OTHER): Payer: Medicare Other | Admitting: Vascular Surgery

## 2017-06-10 ENCOUNTER — Encounter (INDEPENDENT_AMBULATORY_CARE_PROVIDER_SITE_OTHER): Payer: Medicare Other

## 2017-06-10 ENCOUNTER — Ambulatory Visit: Payer: Medicare Other | Admitting: Family Medicine

## 2017-07-07 ENCOUNTER — Ambulatory Visit: Payer: Medicare Other | Admitting: Family Medicine

## 2017-07-09 ENCOUNTER — Ambulatory Visit (INDEPENDENT_AMBULATORY_CARE_PROVIDER_SITE_OTHER): Payer: Medicare Other | Admitting: Family Medicine

## 2017-07-09 ENCOUNTER — Encounter: Payer: Self-pay | Admitting: Family Medicine

## 2017-07-09 VITALS — BP 120/76 | HR 75 | Temp 97.6°F | Resp 16 | Ht 59.0 in | Wt 213.1 lb

## 2017-07-09 DIAGNOSIS — J4520 Mild intermittent asthma, uncomplicated: Secondary | ICD-10-CM

## 2017-07-09 DIAGNOSIS — I119 Hypertensive heart disease without heart failure: Secondary | ICD-10-CM

## 2017-07-09 DIAGNOSIS — I43 Cardiomyopathy in diseases classified elsewhere: Secondary | ICD-10-CM | POA: Diagnosis not present

## 2017-07-09 DIAGNOSIS — E8881 Metabolic syndrome: Secondary | ICD-10-CM | POA: Diagnosis not present

## 2017-07-09 DIAGNOSIS — I10 Essential (primary) hypertension: Secondary | ICD-10-CM

## 2017-07-09 DIAGNOSIS — W57XXXD Bitten or stung by nonvenomous insect and other nonvenomous arthropods, subsequent encounter: Secondary | ICD-10-CM

## 2017-07-09 DIAGNOSIS — E785 Hyperlipidemia, unspecified: Secondary | ICD-10-CM | POA: Diagnosis not present

## 2017-07-09 MED ORDER — FUROSEMIDE 40 MG PO TABS: 40.0000 mg | ORAL_TABLET | Freq: Every day | ORAL | 0 refills | Status: DC

## 2017-07-09 MED ORDER — LIRAGLUTIDE 18 MG/3ML ~~LOC~~ SOPN: 0.6000 mg | PEN_INJECTOR | SUBCUTANEOUS | 1 refills | Status: DC

## 2017-07-09 MED ORDER — TRIAMCINOLONE ACETONIDE 0.1 % EX CREA: 1.0000 "application " | TOPICAL_CREAM | Freq: Two times a day (BID) | CUTANEOUS | 0 refills | Status: DC

## 2017-07-09 MED ORDER — OLMESARTAN MEDOXOMIL-HCTZ 40-12.5 MG PO TABS: 1.0000 | ORAL_TABLET | Freq: Every day | ORAL | 1 refills | Status: DC

## 2017-07-09 MED ORDER — EZETIMIBE 10 MG PO TABS: 10.0000 mg | ORAL_TABLET | Freq: Every day | ORAL | 1 refills | Status: DC

## 2017-07-09 NOTE — Progress Notes (Signed)
Name: Molly [name]   MRN: [id number]    DOB: [birth year]   Date:07/09/2017       Progress Note  Subjective  Chief Complaint  Chief Complaint  Patient presents with  . Insect Bite    pt had 2 tick bites  . Obesity  . Hypertension    HPI    Insulin Resistance: lab results from 01/2017  showed hgbA1C of 6.0% and fasting insulin of 24. We started her on Victoza, her original weight was 223 lbs She denies polyphagia, polydipsia or polyuria at this time. She has nocturia at times. She is feeling much better with the 10 lbs weight loss, states leg swelling and SOB has improved  Dyslipidemia: she states she could not tolerate Lipitor in the past, she tried Crestor but caused muscle aches, advised to take something and she is willing to try Zetia  Asthma Mild intermittent: no SOB, wheezing or cough lately, taking singulair daily, did not use rescue inhaler in months  CHF: denies orthopnea, or chest pain, she is taking lasix and ARB plus HCTZ and bp is at goal,  seen by Dr. Humphrey Rolls in the past, tried multiple bp and could not tolerate it, only on lasix and is at goal   Morbid obesity: she states she has always been obese, since childhood. But worse since her 75's. She is on Victoza and is losing weight.   Patient Active Problem List   Diagnosis Date Noted  . Pain in limb 03/31/2017  . Perennial allergic rhinitis with seasonal variation 05/14/2016  . Asthma, mild intermittent, well-controlled 05/14/2016  . Hypertension goal BP (blood pressure) < 140/90 12/10/2015  . Cardiomyopathy due to hypertension ([place]) 12/10/2015  . History of CVA (cerebrovascular accident) 12/10/2015  . Hyperlipidemia LDL goal <70 12/10/2015  . Statin intolerance 12/10/2015    Past Surgical History:  Procedure Laterality Date  . CHOLECYSTECTOMY    . COLONOSCOPY WITH PROPOFOL N/A 01/08/2017   Procedure: COLONOSCOPY WITH PROPOFOL;  Surgeon: [name], MD;  Location: [facility] ENDOSCOPY;  Service: Endoscopy;  Laterality:  N/A;    Family History  Problem Relation Age of Onset  . Gout Father   . Asthma Father   . Asthma Brother   . Dementia Brother   . Stroke Brother   . Breast cancer Neg Hx     Social History   Social History  . Marital status: Divorced    Spouse name: N/A  . Number of children: N/A  . Years of education: N/A   Occupational History  . Not on file.   Social History Main Topics  . Smoking status: Never Smoker  . Smokeless tobacco: Never Used  . Alcohol use No  . Drug use: No  . Sexual activity: No   Other Topics Concern  . Not on file   Social History Narrative  . No narrative on file     Current Outpatient Prescriptions:  .  acetaminophen (TYLENOL) 500 MG tablet, Take 1 tablet (500 mg total) by mouth every 6 (six) hours as needed., Disp: 30 tablet, Rfl: 0 .  albuterol (PROVENTIL HFA;VENTOLIN HFA) 108 (90 Base) MCG/ACT inhaler, Inhale 2 puffs into the lungs every 4 (four) hours as needed for wheezing or shortness of breath., Disp: 1 Inhaler, Rfl: 0 .  cetirizine (ZYRTEC) 10 MG tablet, Take 1 tablet (10 mg total) by mouth daily., Disp: 90 tablet, Rfl: 1 .  Cholecalciferol (VITAMIN D3) 1000 units CAPS, Take by mouth as needed., Disp: , Rfl:  .  Coenzyme Q10 (CO Q 10) 100 MG CAPS, Take 1 capsule by mouth daily., Disp: 30 capsule, Rfl: 5 .  diclofenac sodium (VOLTAREN) 1 % GEL, Apply 2 g topically 4 (four) times daily., Disp: , Rfl:  .  Elastic Bandages & Supports ([place]) MISC, 1 each by Does not apply route daily. Dx right lower extremity edema 20-30 pressure, Disp: 2 each, Rfl: 2 .  furosemide (LASIX) 40 MG tablet, Take 1 tablet (40 mg total) by mouth daily., Disp: 90 tablet, Rfl: 0 .  GARLIC PO, Take by mouth., Disp: , Rfl:  .  Insulin Pen Needle (NOVOFINE) 30G X 8 MM MISC, Inject 10 each into the skin daily., Disp: 100 each, Rfl: 2 .  Insulin Pen Needle (PEN NEEDLES) 32G X 4 MM MISC, 1 Package by Does not apply route daily., Disp: 100 each, Rfl:  5 .  liraglutide (VICTOZA) 18 MG/3ML SOPN, Inject 0.1-0.3 mLs (0.6-1.8 mg total) into the skin every morning., Disp: 9 mL, Rfl: 5 .  montelukast (SINGULAIR) 10 MG tablet, Take 1 tablet (10 mg total) by mouth at bedtime., Disp: 90 tablet, Rfl: 1 .  olmesartan-hydrochlorothiazide (BENICAR HCT) 40-12.5 MG tablet, Take 1 tablet by mouth daily., Disp: 90 tablet, Rfl: 1  Allergies  Allergen Reactions  . Nsaids Hives and Rash  . Statins Other (See Comments)    Joint pains  . Crestor [Rosuvastatin Calcium] Rash     ROS  Constitutional: Negative for fever , positive for weight change.  Respiratory: Negative for cough and shortness of breath.   Cardiovascular: Negative for chest pain or palpitations.  Gastrointestinal: Negative for abdominal pain, no bowel changes.  Musculoskeletal: Negative for gait problem or joint swelling.  Skin: Negative for rash, except for the site where the tick was, bump, scarring  Neurological: Negative for dizziness or headache.  No other specific complaints in a complete review of systems (except as listed in HPI above).  Objective  Vitals:   07/09/17 0831  BP: 120/76  Pulse: 75  Resp: 16  Temp: 97.6 F (36.4 C)  SpO2: 97%  Weight: 213 lb 1 oz (96.6 kg)  Height: 4\' 11"  (1.499 m)    Body mass index is 43.03 kg/m.  Physical Exam  Constitutional: Patient appears well-developed and well-nourished. Obese  No distress.  HEENT: head atraumatic, normocephalic, pupils equal and reactive to light, , neck supple, throat within normal limits Cardiovascular: Normal rate, regular rhythm and normal heart sounds.  No murmur heard. No BLE edema. Pulmonary/Chest: Effort normal and breath sounds normal. No respiratory distress. Abdominal: Soft.  There is no tenderness. Psychiatric: Patient has a normal mood and affect. behavior is normal. Judgment and thought content normal. Skin: one well healed area on abdomen from tick bite, also has a keloid formation on the  other site of tick bite on left thigh   PHQ2/9: Depression screen [facility] 2/9 11/11/2016 05/14/2016 02/13/2016 12/10/2015  Decreased Interest 0 0 0 0  Down, Depressed, Hopeless 0 0 0 0  PHQ - 2 Score 0 0 0 0    Fall Risk: Fall Risk  11/11/2016 05/14/2016 02/13/2016 12/10/2015  Falls in the past year? No No No No     Assessment & Plan  1. Hyperlipidemia LDL goal <70  She stopped Crestor on her own because of side, discussed other options and she is willing to try Zetia  - ezetimibe (ZETIA) 10 MG tablet; Take 1 tablet (10 mg total) by mouth daily.  Dispense: 90 tablet; Refill: 1  2. Insulin resistance  Doing well, lost 10 lbs since 01/2017, states feeling better - liraglutide (VICTOZA) 18 MG/3ML SOPN; Inject 0.1-0.3 mLs (0.6-1.8 mg total) into the skin every morning.  Dispense: 27 mL; Refill: 1  3. Morbid obesity ([place])  Responding well to Victoza lost almost 5 % of her original weight, no side effects of medication   4. Hypertension goal BP (blood pressure) < 140/90  - furosemide (LASIX) 40 MG tablet; Take 1 tablet (40 mg total) by mouth daily.  Dispense: 90 tablet; Refill: 0  5. Cardiomyopathy due to hypertension, without heart failure ([place])  She is off statin, taking lasix   6. Tick bite, subsequent encounter  Irritated, try kenalog  - triamcinolone cream (KENALOG) 0.1 %; Apply 1 application topically 2 (two) times daily.  Dispense: 30 g; Refill: 0  7. Asthma, mild intermittent, well-controlled  Doing well

## 2017-07-14 ENCOUNTER — Ambulatory Visit (INDEPENDENT_AMBULATORY_CARE_PROVIDER_SITE_OTHER): Payer: Medicare Other

## 2017-07-14 ENCOUNTER — Encounter (INDEPENDENT_AMBULATORY_CARE_PROVIDER_SITE_OTHER): Payer: Medicare Other

## 2017-07-14 ENCOUNTER — Encounter (INDEPENDENT_AMBULATORY_CARE_PROVIDER_SITE_OTHER): Payer: Self-pay | Admitting: Vascular Surgery

## 2017-07-14 ENCOUNTER — Ambulatory Visit (INDEPENDENT_AMBULATORY_CARE_PROVIDER_SITE_OTHER): Payer: Medicare Other | Admitting: Vascular Surgery

## 2017-07-14 ENCOUNTER — Encounter (INDEPENDENT_AMBULATORY_CARE_PROVIDER_SITE_OTHER): Payer: Self-pay

## 2017-07-14 VITALS — BP 127/71 | HR 64 | Resp 16 | Ht 58.5 in | Wt 210.0 lb

## 2017-07-14 DIAGNOSIS — M79604 Pain in right leg: Secondary | ICD-10-CM | POA: Diagnosis not present

## 2017-07-15 NOTE — Progress Notes (Signed)
Patient left before being seen.

## 2017-10-02 ENCOUNTER — Encounter: Payer: Self-pay | Admitting: Family Medicine

## 2017-10-02 ENCOUNTER — Ambulatory Visit (INDEPENDENT_AMBULATORY_CARE_PROVIDER_SITE_OTHER): Payer: Medicare Other | Admitting: Family Medicine

## 2017-10-02 VITALS — BP 118/72 | HR 77 | Temp 98.2°F | Resp 14 | Wt 205.5 lb

## 2017-10-02 DIAGNOSIS — R102 Pelvic and perineal pain: Secondary | ICD-10-CM

## 2017-10-02 DIAGNOSIS — J302 Other seasonal allergic rhinitis: Secondary | ICD-10-CM

## 2017-10-02 DIAGNOSIS — J3089 Other allergic rhinitis: Secondary | ICD-10-CM | POA: Diagnosis not present

## 2017-10-02 DIAGNOSIS — R7303 Prediabetes: Secondary | ICD-10-CM | POA: Diagnosis not present

## 2017-10-02 DIAGNOSIS — R1033 Periumbilical pain: Secondary | ICD-10-CM

## 2017-10-02 DIAGNOSIS — R300 Dysuria: Secondary | ICD-10-CM | POA: Diagnosis not present

## 2017-10-02 DIAGNOSIS — Z23 Encounter for immunization: Secondary | ICD-10-CM | POA: Diagnosis not present

## 2017-10-02 DIAGNOSIS — R195 Other fecal abnormalities: Secondary | ICD-10-CM

## 2017-10-02 HISTORY — DX: Prediabetes: R73.03

## 2017-10-02 LAB — COMPLETE METABOLIC PANEL WITH GFR
AG RATIO: 1.6 (calc) (ref 1.0–2.5)
ALT: 16 U/L (ref 6–29)
AST: 18 U/L (ref 10–35)
Albumin: 4.2 g/dL (ref 3.6–5.1)
Alkaline phosphatase (APISO): 78 U/L (ref 33–130)
BUN/Creatinine Ratio: 15 (calc) (ref 6–22)
BUN: 15 mg/dL (ref 7–25)
CALCIUM: 10.3 mg/dL (ref 8.6–10.4)
CHLORIDE: 98 mmol/L (ref 98–110)
CO2: 32 mmol/L (ref 20–32)
CREATININE: 1.02 mg/dL — AB (ref 0.50–0.99)
GFR, Est African American: 67 mL/min/{1.73_m2} (ref >=60)
GFR, Est Non African American: 58 mL/min/{1.73_m2} — ABNORMAL LOW (ref >=60)
GLOBULIN: 2.7 g/dL (ref 1.9–3.7)
Glucose, Bld: 101 mg/dL (ref 65–139)
POTASSIUM: 3.4 mmol/L — AB (ref 3.5–5.3)
Sodium: 139 mmol/L (ref 135–146)
TOTAL PROTEIN: 6.9 g/dL (ref 6.1–8.1)
Total Bilirubin: 0.6 mg/dL (ref 0.2–1.2)

## 2017-10-02 LAB — POCT URINALYSIS DIPSTICK
Bilirubin, UA: NEGATIVE
Blood, UA: NEGATIVE
GLUCOSE UA: NEGATIVE
Ketones, UA: NEGATIVE
LEUKOCYTES UA: NEGATIVE
NITRITE UA: NEGATIVE
Protein, UA: NEGATIVE
Spec Grav, UA: 1.02 (ref 1.010–1.025)
UROBILINOGEN UA: 0.2 U/dL
pH, UA: 6 (ref 5.0–8.0)

## 2017-10-02 LAB — CBC WITH DIFFERENTIAL/PLATELET
BASOS PCT: 0.4 %
Basophils Absolute: 31 cells/uL (ref 0–200)
EOS PCT: 0.9 %
Eosinophils Absolute: 69 cells/uL (ref 15–500)
HEMATOCRIT: 36.2 % (ref 35.0–45.0)
Hemoglobin: 12.3 g/dL (ref 11.7–15.5)
LYMPHS ABS: 3565 {cells}/uL (ref 850–3900)
MCH: 29.6 pg (ref 27.0–33.0)
MCHC: 34 g/dL (ref 32.0–36.0)
MCV: 87.2 fL (ref 80.0–100.0)
MPV: 10 fL (ref 7.5–12.5)
Monocytes Relative: 6.6 %
Neutro Abs: 3527 cells/uL (ref 1500–7800)
Neutrophils Relative %: 45.8 %
PLATELETS: 413 10*3/uL — AB (ref 140–400)
RBC: 4.15 10*6/uL (ref 3.80–5.10)
RDW: 12.9 % (ref 11.0–15.0)
Total Lymphocyte: 46.3 %
WBC: 7.7 10*3/uL (ref 3.8–10.8)
WBCMIX: 508 {cells}/uL (ref 200–950)

## 2017-10-02 MED ORDER — CETIRIZINE HCL 10 MG PO TABS: 10.0000 mg | ORAL_TABLET | Freq: Every day | ORAL | 1 refills | Status: DC | PRN

## 2017-10-02 NOTE — Patient Instructions (Signed)
Stop the victoza for now We'll get labs today We'll get CT scan on Monday Over the weekend, if things get worse, please go to the ER

## 2017-10-02 NOTE — Progress Notes (Signed)
BP 118/72   Pulse 77   Temp 98.2 F (36.8 C) (Oral)   Resp 14   Wt 205 lb 8 oz (93.2 kg)   SpO2 97%   BMI 42.22 kg/m    Subjective:    Patient ID: Molly [name], female    DOB: 28-Feb-1951, 66 y.o.   MRN: [id number]  HPI: Molly [name] is a 66 y.o. female  Chief Complaint  Patient presents with  . Urinary Tract Infection    supra pubic pressure and pain with urination onset 5days   HPI Patient is here for an acute visit Started hurting 4-5 days ago; getting worse she says First few days, just felt like a little pressure; now streaks of lightning when she has to urinate Pain starts before she actually urinates Pain is suprapubic Has been trying to drink more water Was laying on the table Wednesday and her belly button is sore Has had no fevers Appetite has been pretty good On victoza which takes appetite away, gives her loose stools 2-3 x a day afrer eating Checked the insert and read the info at home; read about pancreatitis and abdominal pain Bowel movements are okay, actually loose Last A1c was 6, on victoza to help with sugars but also weight loss On victoza now for about 6 months, steady dose since April, but took prior to that on different dosages Taking the 1.8 mg strength now No personal hx of hernia Her father had a hernia up high after surgery Open cholecystectomy, more than 30 years ago No blood in the stool but thought she saw a little worm in it; she is concerned she might have worms; looked in the commode today, can't say for sure if it was a worm; it was a long, about 6 inches, string, same color as stool; did not look like mucous, not motile Colonoscopy earlier this year Not checking FSBS, not diabetic Lab Results  Component Value Date   HGBA1C 6.0 (H) 01/23/2017    She has lost 8 pounds since July  Not taking zetia; maybe never filled it, prescribed for the first time in July Lab Results  Component Value Date   CHOL 235 (H) 01/23/2017   HDL  61 01/23/2017   LDLCALC 146 (H) 01/23/2017   TRIG 140 01/23/2017   CHOLHDL 3.9 01/23/2017    Not taking singulair; she thinks her body got used it; not helpful, not going to take  Depression screen [facility] 2/9 10/02/2017 11/11/2016 05/14/2016 02/13/2016 12/10/2015  Decreased Interest 0 0 0 0 0  Down, Depressed, Hopeless 0 0 0 0 0  PHQ - 2 Score 0 0 0 0 0    Relevant past medical, surgical, family and social history reviewed Past Medical History:  Diagnosis Date  . Asthma    history of asthma  . Hypertension   . Prediabetes 10/02/2017   A1c 6 in January 2018  . Stroke [facility])    1990   Past Surgical History:  Procedure Laterality Date  . CHOLECYSTECTOMY    . COLONOSCOPY WITH PROPOFOL N/A 01/08/2017   Procedure: COLONOSCOPY WITH PROPOFOL;  Surgeon: [name], MD;  Location: [facility] ENDOSCOPY;  Service: Endoscopy;  Laterality: N/A;   Family History  Problem Relation Age of Onset  . Gout Father   . Asthma Father   . Asthma Brother   . Dementia Brother   . Stroke Brother   . Breast cancer Neg Hx    Social History   Social History  .  Marital status: Divorced    Spouse name: N/A  . Number of children: N/A  . Years of education: N/A   Occupational History  . Not on file.   Social History Main Topics  . Smoking status: Never Smoker  . Smokeless tobacco: Never Used  . Alcohol use No  . Drug use: No  . Sexual activity: No   Other Topics Concern  . Not on file   Social History Narrative  . No narrative on file    Interim medical history since last visit reviewed. Allergies and medications reviewed  Review of Systems Per HPI unless specifically indicated above     Objective:    BP 118/72   Pulse 77   Temp 98.2 F (36.8 C) (Oral)   Resp 14   Wt 205 lb 8 oz (93.2 kg)   SpO2 97%   BMI 42.22 kg/m   Wt Readings from Last 3 Encounters:  10/02/17 205 lb 8 oz (93.2 kg)  07/14/17 210 lb (95.3 kg)  07/09/17 213 lb 1 oz (96.6 kg)    Physical Exam  Constitutional:  She appears well-developed and well-nourished. No distress.  HENT:  Head: Normocephalic and atraumatic.  Eyes: EOM are normal. No scleral icterus.  Neck: No thyromegaly present.  Cardiovascular: Normal rate, regular rhythm and normal heart sounds.   No murmur heard. Pulmonary/Chest: Effort normal and breath sounds normal. No respiratory distress. She has no wheezes.  Abdominal: Soft. Bowel sounds are normal. She exhibits no distension and no mass. There is tenderness in the periumbilical area. There is no guarding.  Surgical scar from open choly; tender around the umbilicus and just superior; no erythema, no violaceous appearance  Musculoskeletal: Normal range of motion. She exhibits no edema.  Neurological: She is alert. She exhibits normal muscle tone.  Skin: Skin is warm and dry. She is not diaphoretic. No pallor.  Psychiatric: She has a normal mood and affect. Her behavior is normal. Judgment and thought content normal. Her mood appears not anxious. She does not exhibit a depressed mood.   Results for orders placed or performed in visit on 10/02/17  POCT urinalysis dipstick  Result Value Ref Range   Color, UA yellow    Clarity, UA clody    Glucose, UA neg    Bilirubin, UA neg    Ketones, UA neg    Spec Grav, UA 1.020 1.010 - 1.025   Blood, UA neg    pH, UA 6.0 5.0 - 8.0   Protein, UA neg    Urobilinogen, UA 0.2 0.2 or 1.0 E.U./dL   Nitrite, UA neg    Leukocytes, UA Negative Negative      Assessment & Plan:   Problem List Items Addressed This Visit      Respiratory   Perennial allergic rhinitis with seasonal variation    Patient does not think singulair was helping; removed from med list at her request      Relevant Medications   cetirizine (ZYRTEC) 10 MG tablet     Other   Prediabetes    Stop the Victoza for now; I suspect her discomfort is from hernia, but will wait on results of testing before considering whether or not to restart the victoza; she agrees         Other Visit Diagnoses    Burning with urination    -  Primary   urine appears normal   Relevant Orders   POCT urinalysis dipstick (Completed)   Pelvic pressure in female  Relevant Orders   POCT urinalysis dipstick (Completed)   Needs flu shot       Relevant Orders   Flu vaccine HIGH DOSE PF (Fluzone High dose) (Completed)   Periumbilical abdominal pain       Relevant Orders   CBC with Differential/Platelet   COMPLETE METABOLIC PANEL WITH GFR   CT Abdomen Pelvis W Contrast   Abnormal findings in stool       Relevant Orders   Ova and Parasite Examination       Follow up plan: No Follow-up on file.  An after-visit summary was printed and given to the patient at [place].  Please see the patient instructions which may contain other information and recommendations beyond what is mentioned above in the assessment and plan.  Meds ordered this encounter  Medications  . cetirizine (ZYRTEC) 10 MG tablet    Sig: Take 1 tablet (10 mg total) by mouth daily as needed.    Dispense:  90 tablet    Refill:  1    Orders Placed This Encounter  Procedures  . Ova and Parasite Examination  . CT Abdomen Pelvis W Contrast  . Flu vaccine HIGH DOSE PF (Fluzone High dose)  . CBC with Differential/Platelet  . COMPLETE METABOLIC PANEL WITH GFR  . POCT urinalysis dipstick

## 2017-10-02 NOTE — Assessment & Plan Note (Signed)
Stop the Victoza for now; I suspect her discomfort is from hernia, but will wait on results of testing before considering whether or not to restart the victoza; she agrees

## 2017-10-02 NOTE — Assessment & Plan Note (Signed)
Patient does not think singulair was helping; removed from med list at her request

## 2017-10-05 ENCOUNTER — Telehealth: Payer: Self-pay

## 2017-10-05 ENCOUNTER — Emergency Department: Payer: Medicare Other

## 2017-10-05 ENCOUNTER — Emergency Department
Admission: EM | Admit: 2017-10-05 | Discharge: 2017-10-05 | Disposition: A | Discharge status: Home / Self Care | Payer: Medicare Other | Attending: Emergency Medicine | Admitting: Emergency Medicine

## 2017-10-05 ENCOUNTER — Ambulatory Visit: Payer: Medicare Other

## 2017-10-05 ENCOUNTER — Encounter: Payer: Self-pay | Admitting: *Deleted

## 2017-10-05 DIAGNOSIS — R1033 Periumbilical pain: Secondary | ICD-10-CM | POA: Diagnosis present

## 2017-10-05 DIAGNOSIS — I10 Essential (primary) hypertension: Secondary | ICD-10-CM | POA: Diagnosis not present

## 2017-10-05 DIAGNOSIS — K429 Umbilical hernia without obstruction or gangrene: Secondary | ICD-10-CM | POA: Diagnosis not present

## 2017-10-05 DIAGNOSIS — R195 Other fecal abnormalities: Secondary | ICD-10-CM | POA: Diagnosis not present

## 2017-10-05 DIAGNOSIS — J45909 Unspecified asthma, uncomplicated: Secondary | ICD-10-CM | POA: Diagnosis not present

## 2017-10-05 DIAGNOSIS — R109 Unspecified abdominal pain: Secondary | ICD-10-CM | POA: Diagnosis not present

## 2017-10-05 DIAGNOSIS — Z79899 Other long term (current) drug therapy: Secondary | ICD-10-CM | POA: Insufficient documentation

## 2017-10-05 LAB — LIPASE, BLOOD: Lipase: 95 U/L — ABNORMAL HIGH (ref 11–51)

## 2017-10-05 LAB — CBC
HCT: 35.6 % (ref 35.0–47.0)
HEMOGLOBIN: 12.1 g/dL (ref 12.0–16.0)
MCH: 30.3 pg (ref 26.0–34.0)
MCHC: 34 g/dL (ref 32.0–36.0)
MCV: 89.1 fL (ref 80.0–100.0)
PLATELETS: 358 10*3/uL (ref 150–440)
RBC: 4 MIL/uL (ref 3.80–5.20)
RDW: 13.4 % (ref 11.5–14.5)
WBC: 7 10*3/uL (ref 3.6–11.0)

## 2017-10-05 LAB — COMPREHENSIVE METABOLIC PANEL
ALT: 17 U/L (ref 14–54)
ANION GAP: 8 (ref 5–15)
AST: 22 U/L (ref 15–41)
Albumin: 3.9 g/dL (ref 3.5–5.0)
Alkaline Phosphatase: 73 U/L (ref 38–126)
BUN: 15 mg/dL (ref 6–20)
CALCIUM: 9.5 mg/dL (ref 8.9–10.3)
CHLORIDE: 100 mmol/L — AB (ref 101–111)
CO2: 32 mmol/L (ref 22–32)
Creatinine, Ser: 0.86 mg/dL (ref 0.44–1.00)
Glucose, Bld: 109 mg/dL — ABNORMAL HIGH (ref 65–99)
Potassium: 3.5 mmol/L (ref 3.5–5.1)
Sodium: 140 mmol/L (ref 135–145)
Total Bilirubin: 0.6 mg/dL (ref 0.3–1.2)
Total Protein: 7.2 g/dL (ref 6.5–8.1)

## 2017-10-05 MED ORDER — IOPAMIDOL (ISOVUE-300) INJECTION 61%
100.0000 mL | Freq: Once | INTRAVENOUS | Status: AC | PRN
  Administered 2017-10-05: 100 mL via INTRAVENOUS
  Filled 2017-10-05: qty 100

## 2017-10-05 MED ORDER — IOPAMIDOL (ISOVUE-300) INJECTION 61%
30.0000 mL | Freq: Once | INTRAVENOUS | Status: AC | PRN
Start: 2017-10-05 — End: 2017-10-05
  Administered 2017-10-05: 30 mL via ORAL
  Filled 2017-10-05: qty 30

## 2017-10-05 MED ORDER — MORPHINE SULFATE (PF) 2 MG/ML IV SOLN
2.0000 mg | Freq: Once | INTRAVENOUS | Status: AC
  Administered 2017-10-05: 2 mg via INTRAVENOUS
  Filled 2017-10-05: qty 1

## 2017-10-05 MED ORDER — MORPHINE SULFATE (PF) 2 MG/ML IV SOLN
INTRAVENOUS | Status: AC
  Filled 2017-10-05: qty 1

## 2017-10-05 MED ORDER — ONDANSETRON HCL 4 MG/2ML IJ SOLN
4.0000 mg | Freq: Once | INTRAMUSCULAR | Status: AC
  Administered 2017-10-05: 4 mg via INTRAVENOUS
  Filled 2017-10-05: qty 2

## 2017-10-05 MED ORDER — ONDANSETRON HCL 4 MG/2ML IJ SOLN
INTRAMUSCULAR | Status: AC
  Filled 2017-10-05: qty 2

## 2017-10-05 MED ORDER — TRAMADOL HCL 50 MG PO TABS: 50.0000 mg | ORAL_TABLET | Freq: Four times a day (QID) | ORAL | 0 refills | Status: DC | PRN

## 2017-10-05 NOTE — ED Provider Notes (Signed)
[facility] Emergency Department Provider Note   ____________________________________________    I have reviewed the triage vital signs and the nursing notes.   HISTORY  Chief Complaint Abdominal Pain     HPI Molly [name] is a 66 y.o. female Who presents with complaints of abdominal pain. Patient reports worsening abdominal pain over the last 3 days. She reports today it is severe. She complains of pain directly over her umbilicus. She is never had this before. She saw her PCP who ordered a CT scan the patient reports she is not able to weight. No fevers or chills. No nausea or vomiting. Normal stools. History of cholecystectomy. She is not taking anything for this.  Past Medical History:  Diagnosis Date  . Asthma    history of asthma  . Hypertension   . Prediabetes 10/02/2017   A1c 6 in January 2018  . Stroke [facility])    1990    Patient Active Problem List   Diagnosis Date Noted  . Prediabetes 10/02/2017  . Pain in limb 03/31/2017  . Perennial allergic rhinitis with seasonal variation 05/14/2016  . Asthma, mild intermittent, well-controlled 05/14/2016  . Hypertension goal BP (blood pressure) < 140/90 12/10/2015  . Cardiomyopathy due to hypertension ([place]) 12/10/2015  . History of CVA (cerebrovascular accident) 12/10/2015  . Hyperlipidemia LDL goal <70 12/10/2015  . Statin intolerance 12/10/2015    Past Surgical History:  Procedure Laterality Date  . CHOLECYSTECTOMY    . COLONOSCOPY WITH PROPOFOL N/A 01/08/2017   Procedure: COLONOSCOPY WITH PROPOFOL;  Surgeon: [name], MD;  Location: [facility] ENDOSCOPY;  Service: Endoscopy;  Laterality: N/A;    Prior to Admission medications   Medication Sig Start Date End Date Taking? Authorizing Provider  acetaminophen (TYLENOL) 500 MG tablet Take 1 tablet (500 mg total) by mouth every 6 (six) hours as needed. 10/22/16   Steele Sizer, MD  albuterol (PROVENTIL HFA;VENTOLIN HFA) 108 (90 Base) MCG/ACT  inhaler Inhale 2 puffs into the lungs every 4 (four) hours as needed for wheezing or shortness of breath. 11/11/16   Steele Sizer, MD  cetirizine (ZYRTEC) 10 MG tablet Take 1 tablet (10 mg total) by mouth daily as needed. 10/02/17   Arnetha Courser, MD  Cholecalciferol (VITAMIN D3) 1000 units CAPS Take by mouth as needed.    [provider]  Coenzyme Q10 (CO Q 10) 100 MG CAPS Take 1 capsule by mouth daily. Patient not taking: Reported on 10/02/2017 01/29/17   Steele Sizer, MD  diclofenac sodium (VOLTAREN) 1 % GEL Apply 2 g topically 4 (four) times daily.    [provider]  Elastic Bandages & Supports (MEDICAL COMPRESSION STOCKINGS) [place] 1 each by Does not apply route daily. Dx right lower extremity edema 20-30 pressure Patient not taking: Reported on 10/02/2017 11/11/16   Steele Sizer, MD  ezetimibe (ZETIA) 10 MG tablet Take 1 tablet (10 mg total) by mouth daily. Patient not taking: Reported on 10/02/2017 07/09/17   Steele Sizer, MD  furosemide (LASIX) 40 MG tablet Take 1 tablet (40 mg total) by mouth daily. 07/09/17   Steele Sizer, MD  olmesartan-hydrochlorothiazide (BENICAR HCT) 40-12.5 MG tablet Take 1 tablet by mouth daily. 07/09/17   Steele Sizer, MD  traMADol (ULTRAM) 50 MG tablet Take 1 tablet (50 mg total) by mouth every 6 (six) hours as needed. 10/05/17 10/05/18  Lavonia Drafts, MD  triamcinolone cream (KENALOG) 0.1 % Apply 1 application topically 2 (two) times daily. 07/09/17   Steele Sizer, MD  Allergies Nsaids; Statins; and Crestor [rosuvastatin calcium]  Family History  Problem Relation Age of Onset  . Gout Father   . Asthma Father   . Asthma Brother   . Dementia Brother   . Stroke Brother   . Breast cancer Neg Hx     Social History Social History  Substance Use Topics  . Smoking status: Never Smoker  . Smokeless tobacco: Never Used  . Alcohol use No    Review of Systems  Constitutional: No fever/chills Eyes: No visual changes.    ENT: No sore throat. Cardiovascular: Denies chest pain. Respiratory: Denies shortness of breath. Gastrointestinal:as above.   Genitourinary: Negative for dysuria. Musculoskeletal: Negative for back pain. Skin: Negative for rash. Neurological: Negative for headaches   ____________________________________________   PHYSICAL EXAM:  VITAL SIGNS: ED Triage Vitals [10/05/17 1639]  Enc Vitals Group     BP (!) 121/57     Pulse Rate 63     Resp 16     Temp 98.1 F (36.7 C)     Temp Source Oral     SpO2 99 %     Weight 93 kg (205 lb)     Height 1.473 m (4\' 10" )     Head Circumference      Peak Flow      Pain Score      Pain Loc      Pain Edu?      Excl. in [place]?     Constitutional: Alert and oriented. No acute distress. Pleasant and interactive Eyes: Conjunctivae are normal.  Head: Atraumatic.  Mouth/Throat: Mucous membranes are moist.    Cardiovascular: Normal rate, regular rhythm. Grossly normal heart sounds.  Good peripheral circulation. Respiratory: Normal respiratory effort.  No retractions. Lungs CTAB. Gastrointestinal: patient is unable to tolerate even a light touch to her umbilicus. No distention, no CVA tenderness. Genitourinary: deferred Musculoskeletal:  Warm and well perfused Neurologic:  Normal speech and language. No gross focal neurologic deficits are appreciated.  Skin:  Skin is warm, dry and intact. No rash noted. Psychiatric: Mood and affect are normal. Speech and behavior are normal.  ____________________________________________   LABS (all labs ordered are listed, but only abnormal results are displayed)  Labs Reviewed  LIPASE, BLOOD - Abnormal; Notable for the following:       Result Value   Lipase 95 (*)    All other components within normal limits  COMPREHENSIVE METABOLIC PANEL - Abnormal; Notable for the following:    Chloride 100 (*)    Glucose, Bld 109 (*)    All other components within normal limits  CBC    ____________________________________________  EKG  None ____________________________________________  RADIOLOGY  CT abdomen pelvis demonstrates periumbilical hernia, ____________________________________________   PROCEDURES  Procedure(s) performed: No    Critical Care performed:No ____________________________________________   INITIAL IMPRESSION / ASSESSMENT AND PLAN / ED COURSE  Pertinent labs & imaging results that were available during my care of the patient were reviewed by me and considered in my medical decision making (see chart for details).  patient with abdominal pain as above. Differential diagnosis includes hernia, diverticulitis, appendicitis, pancreatitis.  We will obtain labs to give IV morphine, IV Zofran and send for CT abd pelvis  patient felt much better after medications. CT demonstrates periumbilical hernia, possible incarcerated fat. I was able to reduce this with significant improvement in her pain. Discussed with Dr. Rosana Hoes of general surgery, recommended outpatient follow-up  discussed withPatient and family and they agree with this plan  ____________________________________________   FINAL CLINICAL IMPRESSION(S) / ED DIAGNOSES  Final diagnoses:  Periumbilical hernia      NEW MEDICATIONS STARTED DURING THIS VISIT:  Discharge Medication List as of 10/05/2017  7:39 PM    START taking these medications   Details  traMADol (ULTRAM) 50 MG tablet Take 1 tablet (50 mg total) by mouth every 6 (six) hours as needed., Starting Mon 10/05/2017, Until Tue 10/05/2018, Print         Note:  This document was prepared using Dragon voice recognition software and may include unintentional dictation errors.    Lavonia Drafts, MD 10/05/17 2330

## 2017-10-05 NOTE — ED Notes (Signed)
Possible hernia, abd pain

## 2017-10-05 NOTE — Telephone Encounter (Signed)
Daughter notified 

## 2017-10-05 NOTE — ED Notes (Signed)
Pt taken to CT via stretcher.

## 2017-10-05 NOTE — ED Triage Notes (Signed)
Pt to triage via wheelchair.  Pt has pain around navel and was told by her doctor last week she may have a umbilical hernia.  Today, increased pain.  No n/v/d. Pt alert.

## 2017-10-05 NOTE — ED Notes (Signed)
Pt finished contrast. CT notified 

## 2017-10-05 NOTE — ED Notes (Signed)
Pt c/o abd pain x 1 week at belly button. Pt appears to be uncomfortable. Pt states gallbladder has been removed. States still has appendix. Pt saw Dr. Sanda Klein who stated it might be a hernia. Pt extremely tender to touch. Denies N&V&D& fever.

## 2017-10-05 NOTE — Telephone Encounter (Signed)
Temeka called about mother states she is in a lot of pain wants to see if Dr.Lada can order her CT stat so she can get done today?

## 2017-10-05 NOTE — Telephone Encounter (Signed)
I ordered it STAT as requested However, if she is in significant pain, just GO to the ER now We certainly hope she feels better and they find the cause of her pain and help her resolve it quickly

## 2017-10-06 ENCOUNTER — Telehealth: Payer: Self-pay | Admitting: Family Medicine

## 2017-10-06 LAB — OVA AND PARASITE EXAMINATION
CONCENTRATE RESULT: NONE SEEN
MICRO NUMBER:: 81117432
SPECIMEN QUALITY:: ADEQUATE
TRICHROME RESULT: NONE SEEN

## 2017-10-06 NOTE — Telephone Encounter (Signed)
Please check on patient; see how she's feeling Cancel the CT scan I had ordered, since she had one last night in the ER See if anything needed to facilitate getting her in to see the surgeon Thank you

## 2017-10-06 NOTE — Telephone Encounter (Signed)
Thank you, sending to Dr. Ancil Boozer

## 2017-10-06 NOTE — Telephone Encounter (Signed)
ER told pt to f/u with PCP for referral, Dr. Ancil Boozer will place

## 2017-10-07 ENCOUNTER — Other Ambulatory Visit: Payer: Self-pay | Admitting: Family Medicine

## 2017-10-07 DIAGNOSIS — K429 Umbilical hernia without obstruction or gangrene: Secondary | ICD-10-CM

## 2017-10-09 ENCOUNTER — Ambulatory Visit: Payer: Medicare Other

## 2017-10-22 ENCOUNTER — Encounter: Payer: Self-pay | Admitting: Surgery

## 2017-10-22 ENCOUNTER — Other Ambulatory Visit
Admission: RE | Admit: 2017-10-22 | Discharge: 2017-10-22 | Disposition: A | Discharge status: Home / Self Care | Payer: Medicare Other | Source: Ambulatory Visit | Attending: Surgery | Admitting: Surgery

## 2017-10-22 ENCOUNTER — Ambulatory Visit (INDEPENDENT_AMBULATORY_CARE_PROVIDER_SITE_OTHER): Payer: Medicare Other | Admitting: Surgery

## 2017-10-22 VITALS — BP 144/78 | HR 65 | Temp 98.7°F | Ht 58.5 in | Wt 209.0 lb

## 2017-10-22 DIAGNOSIS — R1084 Generalized abdominal pain: Secondary | ICD-10-CM

## 2017-10-22 DIAGNOSIS — K8500 Idiopathic acute pancreatitis without necrosis or infection: Secondary | ICD-10-CM | POA: Diagnosis not present

## 2017-10-22 LAB — CBC WITH DIFFERENTIAL/PLATELET
BASOS ABS: 0 10*3/uL (ref 0–0.1)
Basophils Relative: 1 %
EOS ABS: 0.1 10*3/uL (ref 0–0.7)
EOS PCT: 1 %
HCT: 39.2 % (ref 35.0–47.0)
Hemoglobin: 12.9 g/dL (ref 12.0–16.0)
LYMPHS PCT: 38 %
Lymphs Abs: 3 10*3/uL (ref 1.0–3.6)
MCH: 29.4 pg (ref 26.0–34.0)
MCHC: 33 g/dL (ref 32.0–36.0)
MCV: 89.3 fL (ref 80.0–100.0)
MONO ABS: 0.5 10*3/uL (ref 0.2–0.9)
Monocytes Relative: 7 %
Neutro Abs: 4.2 10*3/uL (ref 1.4–6.5)
Neutrophils Relative %: 53 %
Platelets: 382 10*3/uL (ref 150–440)
RBC: 4.39 MIL/uL (ref 3.80–5.20)
RDW: 13.6 % (ref 11.5–14.5)
WBC: 7.8 10*3/uL (ref 3.6–11.0)

## 2017-10-22 LAB — COMPREHENSIVE METABOLIC PANEL
ALK PHOS: 85 U/L (ref 38–126)
ALT: 16 U/L (ref 14–54)
AST: 21 U/L (ref 15–41)
Albumin: 4.2 g/dL (ref 3.5–5.0)
Anion gap: 11 (ref 5–15)
BILIRUBIN TOTAL: 0.5 mg/dL (ref 0.3–1.2)
BUN: 13 mg/dL (ref 6–20)
CALCIUM: 9.9 mg/dL (ref 8.9–10.3)
CHLORIDE: 100 mmol/L — AB (ref 101–111)
CO2: 30 mmol/L (ref 22–32)
CREATININE: 0.71 mg/dL (ref 0.44–1.00)
GFR calc Af Amer: >60 mL/min (ref >60)
GLUCOSE: 92 mg/dL (ref 65–99)
Potassium: 3.3 mmol/L — ABNORMAL LOW (ref 3.5–5.1)
Sodium: 141 mmol/L (ref 135–145)
TOTAL PROTEIN: 8 g/dL (ref 6.5–8.1)

## 2017-10-22 LAB — LIPASE, BLOOD: Lipase: 51 U/L (ref 11–51)

## 2017-10-22 NOTE — Progress Notes (Signed)
Patient ID: Molly [name], female   DOB: [birth year], 66 y.o.   MRN: [id number]  HPI Molly [name] is a 66 y.o. female seen in consultation at the request of Dr. Corky Downs. She was recently seen in the emergency room for new onset of abdominal pain. She reports that the pain is in the epigastric area and umbilical area. It is intermittent and sharp in nature. Nonspecific out of eating or aggravating factors. Have personally reviewed her CT scan showing a small umbilical hernia and no evidence of significant incarceration no evidence of bowel incarceration. Pancreas and other organs are completely normal. I did notice on her labs that her lipase was 95 which is elevated , she has been taking Victoza which can definitely produce pancreatitis. Cbc and Cmp normal. She is able to perform more than 4 Mets of activity without any shortness of breath or chest pain    HPI  Past Medical History:  Diagnosis Date  . Asthma    history of asthma  . History of CVA (cerebrovascular accident) 12/10/2015  . Hyperlipidemia LDL goal <70 12/10/2015  . Hypertension   . Prediabetes 10/02/2017   A1c 6 in January 2018  . Stroke [facility])    1990    Past Surgical History:  Procedure Laterality Date  . CHOLECYSTECTOMY    . COLONOSCOPY WITH PROPOFOL N/A 01/08/2017   Procedure: COLONOSCOPY WITH PROPOFOL;  Surgeon: [name], MD;  Location: [facility] ENDOSCOPY;  Service: Endoscopy;  Laterality: N/A;    Family History  Problem Relation Age of Onset  . Gout Father   . Asthma Father   . Asthma Brother   . Dementia Brother   . Stroke Brother   . Breast cancer Neg Hx     Social History Social History  Substance Use Topics  . Smoking status: Never Smoker  . Smokeless tobacco: Never Used  . Alcohol use No    Allergies  Allergen Reactions  . Nsaids Hives and Rash  . Statins Other (See Comments)    Joint pains  . Crestor [Rosuvastatin Calcium] Rash    Current Outpatient Prescriptions  Medication Sig Dispense  Refill  . acetaminophen (TYLENOL) 500 MG tablet Take 1 tablet (500 mg total) by mouth every 6 (six) hours as needed. 30 tablet 0  . albuterol (PROVENTIL HFA;VENTOLIN HFA) 108 (90 Base) MCG/ACT inhaler Inhale 2 puffs into the lungs every 4 (four) hours as needed for wheezing or shortness of breath. 1 Inhaler 0  . cetirizine (ZYRTEC) 10 MG tablet Take 1 tablet (10 mg total) by mouth daily as needed. 90 tablet 1  . Cholecalciferol (VITAMIN D3) 1000 units CAPS Take by mouth as needed.    . Coenzyme Q10 (CO Q 10) 100 MG CAPS Take 1 capsule by mouth daily. (Patient not taking: Reported on 10/02/2017) 30 capsule 5  . diclofenac sodium (VOLTAREN) 1 % GEL Apply 2 g topically 4 (four) times daily.    Regino Schultze Bandages & Supports (MEDICAL COMPRESSION STOCKINGS) [place] 1 each by Does not apply route daily. Dx right lower extremity edema 20-30 pressure (Patient not taking: Reported on 10/02/2017) 2 each 2  . ezetimibe (ZETIA) 10 MG tablet Take 1 tablet (10 mg total) by mouth daily. (Patient not taking: Reported on 10/02/2017) 90 tablet 1  . furosemide (LASIX) 40 MG tablet Take 1 tablet (40 mg total) by mouth daily. 90 tablet 0  . olmesartan-hydrochlorothiazide (BENICAR HCT) 40-12.5 MG tablet Take 1 tablet by mouth daily. 90 tablet 1  . traMADol (  ULTRAM) 50 MG tablet Take 1 tablet (50 mg total) by mouth every 6 (six) hours as needed. 20 tablet 0  . triamcinolone cream (KENALOG) 0.1 % Apply 1 application topically 2 (two) times daily. 30 g 0   No current facility-administered medications for this visit.      Review of Systems Full ROS  was asked and was negative except for the information on the HPI  Physical Exam Blood pressure (!) 144/78, pulse 65, temperature 98.7 F (37.1 C), temperature source Oral, height 4' 10.5" (1.486 m), weight 94.8 kg (209 lb). CONSTITUTIONAL: Obese female in NAD EYES: Pupils are equal, round, and reactive to light, Sclera are non-icteric. EARS, NOSE, MOUTH AND THROAT: The  oropharynx is clear. The oral mucosa is pink and moist. Hearing is intact to voice. LYMPH NODES:  Lymph nodes in the neck are normal. RESPIRATORY:  Lungs are clear. There is normal respiratory effort, with equal breath sounds bilaterally, and without pathologic use of accessory muscles. CARDIOVASCULAR: Heart is regular without murmurs, gallops, or rubs. GI: The abdomen is  soft, Diffuse tenderness on epigastric area, reducible UH tender to palpation. There is no hepatosplenomegaly. There are normal bowel sounds GU: Rectal deferred.   MUSCULOSKELETAL: Normal muscle strength and tone. No cyanosis or edema.   SKIN: Turgor is good and there are no pathologic skin lesions or ulcers. NEUROLOGIC: Motor and sensation is grossly normal. Cranial nerves are grossly intact. PSYCH:  Oriented to person, place and time. Affect is normal.  Data Reviewed  I have personally reviewed the patient's imaging, laboratory findings and medical records.    Assessment /Plan 66 year old with abdominal pain findings more consistent with pancreatitis related to [place]. Scars with the patient in detail about holding this medication until we know what they're be more about why her lipase is elevated. I will repeat another CMP, CBC and lipase level as well as a CT scan of the abdomen and pelvis. She does have an umbilical hernia that is symptomatic and that at some point in time is to be repair but the first order of business is to sort out the source of the pancreatitis. Had a lengthy discussion with the patient about my thought process. We will see her back next week with CT results and laboratory results. No need for emergent surgical indication at this time    Caroleen Hamman, MD [facility] Surgeon 10/22/2017, 3:02 PM

## 2017-10-22 NOTE — Patient Instructions (Addendum)
We have ordered some labs to be drawn today. Please proceed to the [facility] to have these tests completed prior to leaving today. You will check in at the registration desk in the medical mall. Please see walking directions below if needed.  We will call you with the results and next step in plan of care as soon as results are received.   Directions to Medical Mall: When leaving our office, go right. Go all of the way down to the very end of the hallway. You will have a purple wall in front of you. You will now have a tunnel to the hospital on your left hand side. Go through this tunnel and the elevators will be on your left. Go down to the 1st floor and take a slight left. The very first desk on the right hand side is the registration desk.   We have scheduled you for a CT Scan of your Abdomen and Pelvis. This has been scheduled at [place] at our [address], [place], [place] [address] location. Please Check-in at 11:15 AM, 15 minutes prior to your scheduled appointment. If you need to reschedule your Scan, you may do so by calling [phone].  You will need to pick up a prep kit at least 24 hours in advance of your Scan: You may pick this up at the [place] department at [place] [facility], or [employer].  Bring a list of medications with you to your appointment and you may have nothing to eat or drink 4 hours prior to your CT Scan.

## 2017-10-23 ENCOUNTER — Telehealth: Payer: Self-pay

## 2017-10-23 NOTE — Telephone Encounter (Signed)
Called patient and was not able to leave her a voicemail. I will call her back on Monday to give her lab results, normal. Her follow up appointment has been changed as well so she could see Dr. Dahlia Byes.

## 2017-10-27 NOTE — Telephone Encounter (Signed)
Called patient again and was not able to leave her a voicemail. I will send her the appointment information by mail.

## 2017-10-29 ENCOUNTER — Ambulatory Visit
Admission: RE | Admit: 2017-10-29 | Discharge: 2017-10-29 | Disposition: A | Discharge status: Home / Self Care | Payer: Medicare Other | Source: Ambulatory Visit | Attending: Surgery | Admitting: Surgery

## 2017-10-29 DIAGNOSIS — R1084 Generalized abdominal pain: Secondary | ICD-10-CM | POA: Diagnosis not present

## 2017-10-29 DIAGNOSIS — I700 Atherosclerosis of aorta: Secondary | ICD-10-CM | POA: Insufficient documentation

## 2017-10-29 DIAGNOSIS — R109 Unspecified abdominal pain: Secondary | ICD-10-CM | POA: Diagnosis not present

## 2017-10-29 HISTORY — PX: HERNIA REPAIR: SHX51

## 2017-10-29 MED ORDER — IOPAMIDOL (ISOVUE-370) INJECTION 76%
100.0000 mL | Freq: Once | INTRAVENOUS | Status: AC | PRN
  Administered 2017-10-29: 100 mL via INTRAVENOUS

## 2017-10-30 ENCOUNTER — Ambulatory Visit: Payer: Self-pay | Admitting: Surgery

## 2017-11-02 ENCOUNTER — Ambulatory Visit (INDEPENDENT_AMBULATORY_CARE_PROVIDER_SITE_OTHER): Payer: Medicare Other | Admitting: Surgery

## 2017-11-02 ENCOUNTER — Encounter: Payer: Self-pay | Admitting: Surgery

## 2017-11-02 VITALS — BP 121/81 | HR 62 | Temp 98.2°F | Ht 58.5 in | Wt 211.0 lb

## 2017-11-02 DIAGNOSIS — K429 Umbilical hernia without obstruction or gangrene: Secondary | ICD-10-CM

## 2017-11-02 NOTE — Patient Instructions (Signed)
You have requested for your Umbilical Hernia be repaired. This has been scheduled on 11/17/17 by Dr. Dahlia Byes at [facility].  Please see your (blue)pre-care sheet for information.  You will need to arrange to be off work for 1-2 weeks but will have to have a lifting restriction of no more than 15 lbs for 6 weeks following your surgery.   Umbilical Hernia, Adult A hernia is a bulge of tissue that pushes through an opening between muscles. An umbilical hernia happens in the abdomen, near the belly button (umbilicus). The hernia may contain tissues from the small intestine, large intestine, or fatty tissue covering the intestines (omentum). Umbilical hernias in adults tend to get worse over time, and they require surgical treatment. There are several types of umbilical hernias. You may have:  A hernia located just above or below the umbilicus (indirect hernia). This is the most common type of umbilical hernia in adults.  A hernia that forms through an opening formed by the umbilicus (direct hernia).  A hernia that comes and goes (reducible hernia). A reducible hernia may be visible only when you strain, lift something heavy, or cough. This type of hernia can be pushed back into the abdomen (reduced).  A hernia that traps abdominal tissue inside the hernia (incarcerated hernia). This type of hernia cannot be reduced.  A hernia that cuts off blood flow to the tissues inside the hernia (strangulated hernia). The tissues can start to die if this happens. This type of hernia requires emergency treatment.  What are the causes? An umbilical hernia happens when tissue inside the abdomen presses on a weak area of the abdominal muscles. What increases the risk? You may have a greater risk of this condition if you:  Are obese.  Have had several pregnancies.  Have a buildup of fluid inside your abdomen (ascites).  Have had surgery that weakens the abdominal muscles.  What are the signs or  symptoms? The main symptom of this condition is a painless bulge at or near the belly button. A reducible hernia may be visible only when you strain, lift something heavy, or cough. Other symptoms may include:  Dull pain.  A feeling of pressure.  Symptoms of a strangulated hernia may include:  Pain that gets increasingly worse.  Nausea and vomiting.  Pain when pressing on the hernia.  Skin over the hernia becoming red or purple.  Constipation.  Blood in the stool.  How is this diagnosed? This condition may be diagnosed based on:  A physical exam. You may be asked to cough or strain while standing. These actions increase the pressure inside your abdomen and force the hernia through the opening in your muscles. Your health care provider may try to reduce the hernia by pressing on it.  Your symptoms and medical history.  How is this treated? Surgery is the only treatment for an umbilical hernia. Surgery for a strangulated hernia is done as soon as possible. If you have a small hernia that is not incarcerated, you may need to lose weight before having surgery. Follow these instructions at home:  Lose weight, if told by your health care provider.  Do not try to push the hernia back in.  Watch your hernia for any changes in color or size. Tell your health care provider if any changes occur.  You may need to avoid activities that increase pressure on your hernia.  Do not lift anything that is heavier than 10 lb (4.5 kg) until your health care  provider says that this is safe.  Take over-the-counter and prescription medicines only as told by your health care provider.  Keep all follow-up visits as told by your health care provider. This is important. Contact a health care provider if:  Your hernia gets larger.  Your hernia becomes painful. Get help right away if:  You develop sudden, severe pain near the area of your hernia.  You have pain as well as nausea or  vomiting.  You have pain and the skin over your hernia changes color.  You develop a fever. This information is not intended to replace advice given to you by your health care provider. Make sure you discuss any questions you have with your health care provider. Document Released: 05/16/2016 Document Revised: 08/17/2016 Document Reviewed: 05/16/2016 Elsevier Interactive Patient Education  [employer].

## 2017-11-03 ENCOUNTER — Encounter: Payer: Self-pay | Admitting: Surgery

## 2017-11-03 NOTE — H&P (View-Only) (Signed)
Outpatient Surgical Follow Up  87/07/6766  Molly [name] is an 66 y.o. female.   Chief Complaint  Patient presents with  . Follow-up    Follow Up: Umbilical Hernia-Go over CT Scan and lab results (possible pancreatitis)    HPI: Molly [name] is a 66 year old obese female with a history of symptomatic umbilical hernia. She did develop pancreatitis related to Victoza. I have ordered repeat the labs and they are completely normal. Her lipase is normalized. Her symptoms are improving but she still has persistent abdominal wall periumbilical pain. I also personally reviewed her CT scan and there is no evidence of any necrosis of the pancreas no evidence of obvious pancreatitis or collections. There is around 2 cm umbilical hernia defect. There is also some diastasis recti. No fevers , no chills, no SBO.  Past Medical History:  Diagnosis Date  . Asthma    history of asthma  . History of CVA (cerebrovascular accident) 12/10/2015  . Hyperlipidemia LDL goal <70 12/10/2015  . Hypertension   . Prediabetes 10/02/2017   A1c 6 in January 2018  . Stroke [facility])    1990    Past Surgical History:  Procedure Laterality Date  . CHOLECYSTECTOMY      Family History  Problem Relation Age of Onset  . Gout Father   . Asthma Father   . Asthma Brother   . Dementia Brother   . Stroke Brother   . Breast cancer Neg Hx     Social History:  reports that  has never smoked. she has never used smokeless tobacco. She reports that she does not drink alcohol or use drugs.  Allergies:  Allergies  Allergen Reactions  . Nsaids Hives and Rash  . Statins Other (See Comments)    Joint pains  . Crestor [Rosuvastatin Calcium] Rash    Medications reviewed.    ROS Full ROS performed and is otherwise negative other than what is stated in HPI   BP 121/81   Pulse 62   Temp 98.2 F (36.8 C) (Oral)   Ht 4' 10.5" (1.486 m)   Wt 95.7 kg (211 lb)   BMI 43.35 kg/m    Physical Exam  Constitutional: She is  oriented to person, place, and time and well-developed, well-nourished, and in no distress. No distress.  Neck: Normal range of motion. No JVD present. No tracheal deviation present.  Cardiovascular: Normal rate and regular rhythm.  Pulmonary/Chest: Effort normal and breath sounds normal. No stridor. No respiratory distress. She has no wheezes.  Abdominal: Soft. She exhibits no distension and no mass. There is tenderness. There is no rebound and no guarding.  Obese, reducible but tender Umbilical hernia. No peritonitis. Previous kocher scar.  Musculoskeletal: Normal range of motion. She exhibits no edema.  Neurological: She is alert and oriented to person, place, and time. Gait normal. GCS score is 15.  Skin: Skin is warm and dry. She is not diaphoretic.  Psychiatric: Mood, memory, affect and judgment normal.  Nursing note and vitals reviewed.    Assessment/Plan: 66 year old with a history of symptomatic umbilical hernia. Discussed with the patient in detail I do recommend repair I discussed possibility of incarceration, strangulation, enlargement in size over time, and the risk of emergency surgery in the face of strangulation.  Also discussed the risk of surgery including recurrence which can be up to 30% in the case of complex hernias, use of prosthetic materials (mesh) and the increased risk of infxn, post-op infxn and the possible need for re-operation and  removal of mesh if used, possibility of post-op SBO or ileus, and the risks of general anesthetic including MI, CVA, sudden death or even reaction to anesthetic medications. The patient and family understands the risks, any and all questions were answered to the patient's satisfaction. She understands and wishes to proceed. Plan [facility] repair w mesh   Greater than 50% of the 40 minutes  visit was spent in counseling/coordination of care   Caroleen Hamman, MD [place] Surgeon

## 2017-11-03 NOTE — Progress Notes (Signed)
Outpatient Surgical Follow Up  40/08/8118  Molly [name] is an 66 y.o. female.   Chief Complaint  Patient presents with  . Follow-up    Follow Up: Umbilical Hernia-Go over CT Scan and lab results (possible pancreatitis)    HPI: Molly [name] is a 66 year old obese female with a history of symptomatic umbilical hernia. She did develop pancreatitis related to Victoza. I have ordered repeat the labs and they are completely normal. Her lipase is normalized. Her symptoms are improving but she still has persistent abdominal wall periumbilical pain. I also personally reviewed her CT scan and there is no evidence of any necrosis of the pancreas no evidence of obvious pancreatitis or collections. There is around 2 cm umbilical hernia defect. There is also some diastasis recti. No fevers , no chills, no SBO.  Past Medical History:  Diagnosis Date  . Asthma    history of asthma  . History of CVA (cerebrovascular accident) 12/10/2015  . Hyperlipidemia LDL goal <70 12/10/2015  . Hypertension   . Prediabetes 10/02/2017   A1c 6 in January 2018  . Stroke [facility])    1990    Past Surgical History:  Procedure Laterality Date  . CHOLECYSTECTOMY      Family History  Problem Relation Age of Onset  . Gout Father   . Asthma Father   . Asthma Brother   . Dementia Brother   . Stroke Brother   . Breast cancer Neg Hx     Social History:  reports that  has never smoked. she has never used smokeless tobacco. She reports that she does not drink alcohol or use drugs.  Allergies:  Allergies  Allergen Reactions  . Nsaids Hives and Rash  . Statins Other (See Comments)    Joint pains  . Crestor [Rosuvastatin Calcium] Rash    Medications reviewed.    ROS Full ROS performed and is otherwise negative other than what is stated in HPI   BP 121/81   Pulse 62   Temp 98.2 F (36.8 C) (Oral)   Ht 4' 10.5" (1.486 m)   Wt 95.7 kg (211 lb)   BMI 43.35 kg/m    Physical Exam  Constitutional: She is  oriented to person, place, and time and well-developed, well-nourished, and in no distress. No distress.  Neck: Normal range of motion. No JVD present. No tracheal deviation present.  Cardiovascular: Normal rate and regular rhythm.  Pulmonary/Chest: Effort normal and breath sounds normal. No stridor. No respiratory distress. She has no wheezes.  Abdominal: Soft. She exhibits no distension and no mass. There is tenderness. There is no rebound and no guarding.  Obese, reducible but tender Umbilical hernia. No peritonitis. Previous kocher scar.  Musculoskeletal: Normal range of motion. She exhibits no edema.  Neurological: She is alert and oriented to person, place, and time. Gait normal. GCS score is 15.  Skin: Skin is warm and dry. She is not diaphoretic.  Psychiatric: Mood, memory, affect and judgment normal.  Nursing note and vitals reviewed.    Assessment/Plan: 66 year old with a history of symptomatic umbilical hernia. Discussed with the patient in detail I do recommend repair I discussed possibility of incarceration, strangulation, enlargement in size over time, and the risk of emergency surgery in the face of strangulation.  Also discussed the risk of surgery including recurrence which can be up to 30% in the case of complex hernias, use of prosthetic materials (mesh) and the increased risk of infxn, post-op infxn and the possible need for re-operation and  removal of mesh if used, possibility of post-op SBO or ileus, and the risks of general anesthetic including MI, CVA, sudden death or even reaction to anesthetic medications. The patient and family understands the risks, any and all questions were answered to the patient's satisfaction. She understands and wishes to proceed. Plan [facility] repair w mesh   Greater than 50% of the 40 minutes  visit was spent in counseling/coordination of care   Caroleen Hamman, MD [place] Surgeon

## 2017-11-05 ENCOUNTER — Telehealth: Payer: Self-pay | Admitting: Surgery

## 2017-11-05 NOTE — Telephone Encounter (Signed)
Pt advised of pre op date/time and sx date. Sx: 11/17/17 with Dr Carolin Coy umbilical hernia repair.  Pre op: 11/10/17 @ 10:00am--office interview.   Patient made aware to call [phone], between 1-3:00pm the day before surgery, to find out what time to arrive.

## 2017-11-10 ENCOUNTER — Ambulatory Visit (INDEPENDENT_AMBULATORY_CARE_PROVIDER_SITE_OTHER): Payer: Medicare Other | Admitting: Family Medicine

## 2017-11-10 ENCOUNTER — Other Ambulatory Visit: Payer: Self-pay

## 2017-11-10 ENCOUNTER — Encounter: Payer: Self-pay | Admitting: Family Medicine

## 2017-11-10 ENCOUNTER — Encounter
Admission: RE | Admit: 2017-11-10 | Discharge: 2017-11-10 | Disposition: A | Discharge status: Home / Self Care | Payer: Medicare Other | Source: Ambulatory Visit | Attending: Surgery | Admitting: Surgery

## 2017-11-10 VITALS — BP 126/74 | HR 72 | Temp 97.5°F | Resp 18 | Ht 59.0 in | Wt 213.8 lb

## 2017-11-10 DIAGNOSIS — K429 Umbilical hernia without obstruction or gangrene: Secondary | ICD-10-CM | POA: Insufficient documentation

## 2017-11-10 DIAGNOSIS — Z8673 Personal history of transient ischemic attack (TIA), and cerebral infarction without residual deficits: Secondary | ICD-10-CM | POA: Diagnosis not present

## 2017-11-10 DIAGNOSIS — R102 Pelvic and perineal pain: Secondary | ICD-10-CM

## 2017-11-10 DIAGNOSIS — I700 Atherosclerosis of aorta: Secondary | ICD-10-CM | POA: Diagnosis not present

## 2017-11-10 DIAGNOSIS — N814 Uterovaginal prolapse, unspecified: Secondary | ICD-10-CM

## 2017-11-10 DIAGNOSIS — I2510 Atherosclerotic heart disease of native coronary artery without angina pectoris: Secondary | ICD-10-CM | POA: Diagnosis not present

## 2017-11-10 DIAGNOSIS — R001 Bradycardia, unspecified: Secondary | ICD-10-CM | POA: Insufficient documentation

## 2017-11-10 DIAGNOSIS — I10 Essential (primary) hypertension: Secondary | ICD-10-CM | POA: Diagnosis not present

## 2017-11-10 DIAGNOSIS — I119 Hypertensive heart disease without heart failure: Secondary | ICD-10-CM | POA: Diagnosis not present

## 2017-11-10 DIAGNOSIS — I43 Cardiomyopathy in diseases classified elsewhere: Secondary | ICD-10-CM

## 2017-11-10 DIAGNOSIS — E785 Hyperlipidemia, unspecified: Secondary | ICD-10-CM | POA: Diagnosis not present

## 2017-11-10 DIAGNOSIS — E8881 Metabolic syndrome: Secondary | ICD-10-CM

## 2017-11-10 DIAGNOSIS — Z789 Other specified health status: Secondary | ICD-10-CM

## 2017-11-10 DIAGNOSIS — R7303 Prediabetes: Secondary | ICD-10-CM | POA: Insufficient documentation

## 2017-11-10 DIAGNOSIS — D259 Leiomyoma of uterus, unspecified: Secondary | ICD-10-CM

## 2017-11-10 DIAGNOSIS — Z01812 Encounter for preprocedural laboratory examination: Secondary | ICD-10-CM | POA: Diagnosis not present

## 2017-11-10 DIAGNOSIS — Z01810 Encounter for preprocedural cardiovascular examination: Secondary | ICD-10-CM | POA: Diagnosis not present

## 2017-11-10 LAB — POTASSIUM: Potassium: 3.5 mmol/L (ref 3.5–5.1)

## 2017-11-10 MED ORDER — EZETIMIBE 10 MG PO TABS: 10.0000 mg | ORAL_TABLET | Freq: Every day | ORAL | 1 refills | Status: DC

## 2017-11-10 MED ORDER — OLMESARTAN MEDOXOMIL-HCTZ 40-12.5 MG PO TABS: 1.0000 | ORAL_TABLET | Freq: Every day | ORAL | 1 refills | Status: DC

## 2017-11-10 NOTE — Patient Instructions (Signed)
Your procedure is scheduled on: Tuesday 11/17/17 Report to [place]. 2ND FLOOR MEDICAL MALL ENTRANCE. To find out your arrival time please call [phone] between 1PM - 3PM on Monday 11/16/17.  Remember: Instructions that are not followed completely may result in serious medical risk, up to and including death, or upon the discretion of your surgeon and anesthesiologist your surgery may need to be rescheduled.    __X__ 1. Do not eat anything after midnight the night before your    procedure.  No gum chewing or hard candies.  You may drink clear   liquids up to 2 hours before you are scheduled to arrive at the   hospital for your procedure. Do not drink clear liquids within 2   hours of scheduled arrival to the hospital as this may lead to your   procedure being delayed or rescheduled.       Clear liquids include:   Water or Apple juice without pulp   Clear carbohydrate beverage such as Clearfast or Gatorade   Black coffee or Clear Tea (no milk, no creamer, do not add anything   to the coffee or tea)    Diabetics should only drink water   __X__ 2. No Alcohol for 24 hours before or after surgery.   ____ 3. Bring all medications with you on the day of surgery if instructed.    __X__ 4. Notify your doctor if there is any change in your medical condition     (cold, fever, infections).             __X___5. No smoking within 24 hours of your surgery.     Do not wear jewelry, make-up, hairpins, clips or nail polish.  Do not wear lotions, powders, or perfumes.   Do not shave 48 hours prior to surgery. Men may shave face and neck.  Do not bring valuables to the hospital.    [facility] is not responsible for any belongings or valuables.               Contacts, dentures or bridgework may not be worn into surgery.  Leave your suitcase in the car. After surgery it may be brought to your room.  For patients admitted to the hospital, discharge time is determined by your                 treatment team.   Patients discharged the day of surgery will not be allowed to drive home.   Please read over the following fact sheets that you were given:   MRSA Information   ___ Take these medicines the morning of surgery with A SIP OF WATER:    1. NONE  2.   3.   4.  5.  6.  ____ Fleet Enema (as directed)   __X__ Use CHG Soap/SAGE wipes as directed  __X__ Use inhalers on the day of surgery  ____ Stop metformin 2 days prior to surgery    ____ Take 1/2 of usual insulin dose the night before surgery and none on the morning of surgery.   ____ Stop Coumadin/Plavix/aspirin on   __X__ Stop Anti-inflammatories such as Advil, Aleve, Ibuprofen, Motrin, Naproxen, Naprosyn, Goodies,powder, or aspirin products.  OK to take Tylenol.   __X__ Stop supplements, Vitamin E, Fish Oil until after surgery.    ____ Bring C-Pap to the hospital.

## 2017-11-10 NOTE — Progress Notes (Signed)
Name: Molly [name]   MRN: [id number]    DOB: 12-Jun-1951   Date:11/10/2017       Progress Note  Subjective  Chief Complaint  Chief Complaint  Patient presents with  . Medication Refill  . Hypertension    Edema occasionally  . Insulin Resistance    Was taking Victoza and did well but had Pancreatitis and had to stop  . Dyslipidemia  . Asthma    Well controlled  . Congestive Heart Failure  . Obesity    HPI  Obesity: she responded well to Victoza, had noticed that it curbed her appetite, gave her energy and leg edema had improved, however developed low abdominal pain and medication was stopped because of possible pancreatitis ( lipase went up)  Asthma: no cough or wheezing at this time, no SOB  Insulin Resistance: lab results from 01/2017  showed hgbA1C of 6.0% and fasting insulin of 24. We started her on Victoza, her original weight was 223 lbs She denies polyphagia, polydipsia or polyuria at this time. She has nocturia at times. She was losing weight and feeling well, however unable to tolerate medication, possible pancreatitis.  Dyslipidemia: she states she could not tolerate Lipitor in the past, she tried Crestor but caused muscle aches, advised to take something and she was given  Zetia on her last visit, however she thought it was a statin and did not start medication   CHF: denies orthopnea, or chest pain, she is taking lasix and ARB plus HCTZ and bp is at goal,  seen by Dr. Humphrey Rolls in the past, tried multiple bp and could not tolerate it, only on lasix and is at [facility], edema has improved.   Umbilical hernia: seen Dr. Dahlia Byes and is going to have umbilical hernia repair next Tuesday, however found to have uterine prolapse during our visit, we will contact his office to see if the repair and hysterectomy can be done at the same time. CT also showed atherosclerosis aorta and also right coronary aorta  Uterine prolapse: she has notice supra pubic fullness and pain, sometimes able to  feel her cervix, going on for months, but getting worse.     Patient Active Problem List   Diagnosis Date Noted  . Atherosclerosis of right coronary artery 11/10/2017  . Atherosclerosis of abdominal aorta ([place]) 11/10/2017  . Prediabetes 10/02/2017  . Pain in limb 03/31/2017  . Perennial allergic rhinitis with seasonal variation 05/14/2016  . Asthma, mild intermittent, well-controlled 05/14/2016  . Hypertension goal BP (blood pressure) < 140/90 12/10/2015  . Cardiomyopathy due to hypertension ([place]) 12/10/2015  . History of CVA (cerebrovascular accident) 12/10/2015  . Hyperlipidemia LDL goal <70 12/10/2015  . Statin intolerance 12/10/2015    Past Surgical History:  Procedure Laterality Date  . CHOLECYSTECTOMY      Family History  Problem Relation Age of Onset  . Gout Father   . Asthma Father   . Asthma Brother   . Dementia Brother   . Stroke Brother   . Breast cancer Neg Hx     Social History   Socioeconomic History  . Marital status: Divorced    Spouse name: Not on file  . Number of children: Not on file  . Years of education: Not on file  . Highest education level: Not on file  Social Needs  . Financial resource strain: Not on file  . Food insecurity - worry: Not on file  . Food insecurity - inability: Not on file  . Transportation needs -  medical: Not on file  . Transportation needs - non-medical: Not on file  Occupational History  . Not on file  Tobacco Use  . Smoking status: Never Smoker  . Smokeless tobacco: Never Used  Substance and Sexual Activity  . Alcohol use: No    Alcohol/week: 0.0 oz  . Drug use: No  . Sexual activity: No  Other Topics Concern  . Not on file  Social History Narrative  . Not on file     Current Outpatient Medications:  .  acetaminophen (TYLENOL) 500 MG tablet, Take 1 tablet (500 mg total) by mouth every 6 (six) hours as needed. (Patient taking differently: Take 1,000 mg every 6 (six) hours as needed by mouth for moderate  pain. ), Disp: 30 tablet, Rfl: 0 .  albuterol (PROVENTIL HFA;VENTOLIN HFA) 108 (90 Base) MCG/ACT inhaler, Inhale 2 puffs into the lungs every 4 (four) hours as needed for wheezing or shortness of breath., Disp: 1 Inhaler, Rfl: 0 .  cetirizine (ZYRTEC) 10 MG tablet, Take 1 tablet (10 mg total) by mouth daily as needed., Disp: 90 tablet, Rfl: 1 .  diclofenac sodium (VOLTAREN) 1 % GEL, Apply 2 g 3 (three) times daily as needed topically (pain). , Disp: , Rfl:  .  Elastic Bandages & Supports ([place]) MISC, 1 each by Does not apply route daily. Dx right lower extremity edema 20-30 pressure, Disp: 2 each, Rfl: 2 .  furosemide (LASIX) 40 MG tablet, Take 1 tablet (40 mg total) by mouth daily., Disp: 90 tablet, Rfl: 0 .  Multiple Vitamin (MULTIVITAMIN WITH MINERALS) TABS tablet, Take 1 tablet 4 (four) times a week by mouth., Disp: , Rfl:  .  olmesartan-hydrochlorothiazide (BENICAR HCT) 40-12.5 MG tablet, Take 1 tablet by mouth daily., Disp: 90 tablet, Rfl: 1 .  Triamcinolone Acetonide (NASACORT ALLERGY 24HR NA), Place 1 spray daily as needed into the nose (allergies)., Disp: , Rfl:  .  triamcinolone cream (KENALOG) 0.1 %, Apply 1 application topically 2 (two) times daily., Disp: 30 g, Rfl: 0 .  ezetimibe (ZETIA) 10 MG tablet, Take 1 tablet (10 mg total) by mouth daily. (Patient not taking: Reported on 11/09/2017), Disp: 90 tablet, Rfl: 1  Allergies  Allergen Reactions  . Nsaids Hives and Rash  . Statins Other (See Comments)    Joint pains  . Victoza [Liraglutide]     pancreatitis  . Crestor [Rosuvastatin Calcium] Rash  . Zetia [Ezetimibe] Rash     ROS  Constitutional: Negative for fever , positive for  weight change.  Respiratory: Negative for cough and shortness of breath.   Cardiovascular: Negative for chest pain or palpitations.  Gastrointestinal: Positive  for abdominal pain, and  bowel changes.  Musculoskeletal: Negative for gait problem or joint swelling.  Skin:  Negative for rash.  Neurological: Negative for dizziness or headache.  No other specific complaints in a complete review of systems (except as listed in HPI above).  Objective  Vitals:   11/10/17 1346  BP: 126/74  Pulse: 72  Resp: 18  Temp: (!) 97.5 F (36.4 C)  TempSrc: Oral  SpO2: 96%  Weight: 213 lb 12.8 oz (97 kg)  Height: _0  (1.499 m)    Body mass index is 43.18 kg/m.  Physical Exam  Constitutional: Patient appears well-developed and well-nourished. Obese  No distress.  HEENT: head atraumatic, normocephalic, pupils equal and reactive to light, neck supple, throat within normal limits Cardiovascular: Normal rate, regular rhythm and normal heart sounds.  No murmur heard. No  BLE edema. Pulmonary/Chest: Effort normal and breath sounds normal. No respiratory distress. Abdominal: Soft.  There is  Tenderness during palpation of umbilicus, normal bowel sounds GYN: her cervix is very low, barely above the vaginal introitus - some erythema. Possible polyp. Psychiatric: Patient has a normal mood and affect. behavior is normal. Judgment and thought content normal.   Recent Results (from the past 2160 hour(s))  POCT urinalysis dipstick     Status: Normal   Collection Time: 10/02/17  4:11 PM  Result Value Ref Range   Color, UA yellow    Clarity, UA clody    Glucose, UA neg    Bilirubin, UA neg    Ketones, UA neg    Spec Grav, UA 1.020 1.010 - 1.025   Blood, UA neg    pH, UA 6.0 5.0 - 8.0   Protein, UA neg    Urobilinogen, UA 0.2 0.2 or 1.0 E.U./dL   Nitrite, UA neg    Leukocytes, UA Negative Negative  CBC with Differential/Platelet     Status: Abnormal   Collection Time: 10/02/17  4:27 PM  Result Value Ref Range   WBC 7.7 3.8 - 10.8 Thousand/uL   RBC 4.15 3.80 - 5.10 Million/uL   Hemoglobin 12.3 11.7 - 15.5 g/dL   HCT 36.2 35.0 - 45.0 %   MCV 87.2 80.0 - 100.0 fL   MCH 29.6 27.0 - 33.0 pg   MCHC 34.0 32.0 - 36.0 g/dL   RDW 12.9 11.0 - 15.0 %   Platelets 413 (H)  140 - 400 Thousand/uL   MPV 10.0 7.5 - 12.5 fL   Neutro Abs 3,527 1,500 - 7,800 cells/uL   Lymphs Abs 3,565 850 - 3,900 cells/uL   WBC mixed population 508 200 - 950 cells/uL   Eosinophils Absolute 69 15 - 500 cells/uL   Basophils Absolute 31 0 - 200 cells/uL   Neutrophils Relative % 45.8 %   Total Lymphocyte 46.3 %   Monocytes Relative 6.6 %   Eosinophils Relative 0.9 %   Basophils Relative 0.4 %  COMPLETE METABOLIC PANEL WITH GFR     Status: Abnormal   Collection Time: 10/02/17  4:27 PM  Result Value Ref Range   Glucose, Bld 101 65 - 139 mg/dL    Comment: .        Non-fasting reference interval .    BUN 15 7 - 25 mg/dL   Creat 1.02 (H) 0.50 - 0.99 mg/dL    Comment: For patients >10 years of age, the reference limit for Creatinine is approximately 13% higher for people identified as African-American. .    GFR, Est Non African American 58 (L) > OR = 60 mL/min/1.6m   GFR, Est African American 67 > OR = 60 mL/min/1.742m  BUN/Creatinine Ratio 15 6 - 22 (calc)   Sodium 139 135 - 146 mmol/L   Potassium 3.4 (L) 3.5 - 5.3 mmol/L   Chloride 98 98 - 110 mmol/L   CO2 32 20 - 32 mmol/L   Calcium 10.3 8.6 - 10.4 mg/dL   Total Protein 6.9 6.1 - 8.1 g/dL   Albumin 4.2 3.6 - 5.1 g/dL   Globulin 2.7 1.9 - 3.7 g/dL (calc)   AG Ratio 1.6 1.0 - 2.5 (calc)   Total Bilirubin 0.6 0.2 - 1.2 mg/dL   Alkaline phosphatase (APISO) 78 33 - 130 U/L   AST 18 10 - 35 U/L   ALT 16 6 - 29 U/L  Ova and Parasite Examination  Status: None   Collection Time: 10/05/17  4:24 PM  Result Value Ref Range   MICRO NUMBER: [id number]    SPECIMEN QUALITY: ADEQUATE    Source STOOL    STATUS: FINAL    CONCENTRATE RESULT: No ova or parasites seen    TRICHROME RESULT: No ova or parasites seen    COMMENT:      Routine Ova and Parasite exam may not detect some parasites that occasionally cause diarrheal illness. Test code(s) 46659 (Cryptosporidium Ag., DFA) and/or 10018 (Cyclospora and Isospora Exam) may be  ordered to detect these parasites. One negative sample  does not necessarily rule out the presence of a parasitic infection.   Lipase, blood     Status: Abnormal   Collection Time: 10/05/17  4:44 PM  Result Value Ref Range   Lipase 95 (H) 11 - 51 U/L  Comprehensive metabolic panel     Status: Abnormal   Collection Time: 10/05/17  4:44 PM  Result Value Ref Range   Sodium 140 135 - 145 mmol/L   Potassium 3.5 3.5 - 5.1 mmol/L   Chloride 100 (L) 101 - 111 mmol/L   CO2 32 22 - 32 mmol/L   Glucose, Bld 109 (H) 65 - 99 mg/dL   BUN 15 6 - 20 mg/dL   Creatinine, Ser 0.86 0.44 - 1.00 mg/dL   Calcium 9.5 8.9 - 10.3 mg/dL   Total Protein 7.2 6.5 - 8.1 g/dL   Albumin 3.9 3.5 - 5.0 g/dL   AST 22 15 - 41 U/L   ALT 17 14 - 54 U/L   Alkaline Phosphatase 73 38 - 126 U/L   Total Bilirubin 0.6 0.3 - 1.2 mg/dL   GFR calc non Af Amer >60 >60 mL/min   GFR calc Af Amer >60 >60 mL/min    Comment: (NOTE) The eGFR has been calculated using the CKD EPI equation. This calculation has not been validated in all clinical situations. eGFR's persistently <60 mL/min signify possible Chronic Kidney Disease.    Anion gap 8 5 - 15  CBC     Status: None   Collection Time: 10/05/17  4:44 PM  Result Value Ref Range   WBC 7.0 3.6 - 11.0 K/uL   RBC 4.00 3.80 - 5.20 MIL/uL   Hemoglobin 12.1 12.0 - 16.0 g/dL   HCT 35.6 35.0 - 47.0 %   MCV 89.1 80.0 - 100.0 fL   MCH 30.3 26.0 - 34.0 pg   MCHC 34.0 32.0 - 36.0 g/dL   RDW 13.4 11.5 - 14.5 %   Platelets 358 150 - 440 K/uL  Comprehensive metabolic panel     Status: Abnormal   Collection Time: 10/22/17  3:38 PM  Result Value Ref Range   Sodium 141 135 - 145 mmol/L   Potassium 3.3 (L) 3.5 - 5.1 mmol/L   Chloride 100 (L) 101 - 111 mmol/L   CO2 30 22 - 32 mmol/L   Glucose, Bld 92 65 - 99 mg/dL   BUN 13 6 - 20 mg/dL   Creatinine, Ser 0.71 0.44 - 1.00 mg/dL   Calcium 9.9 8.9 - 10.3 mg/dL   Total Protein 8.0 6.5 - 8.1 g/dL   Albumin 4.2 3.5 - 5.0 g/dL   AST 21 15 -  41 U/L   ALT 16 14 - 54 U/L   Alkaline Phosphatase 85 38 - 126 U/L   Total Bilirubin 0.5 0.3 - 1.2 mg/dL   GFR calc non Af Amer >60 >60 mL/min   GFR  calc Af Amer >60 >60 mL/min    Comment: (NOTE) The eGFR has been calculated using the CKD EPI equation. This calculation has not been validated in all clinical situations. eGFR's persistently <60 mL/min signify possible Chronic Kidney Disease.    Anion gap 11 5 - 15  CBC with Differential/Platelet     Status: None   Collection Time: 10/22/17  3:38 PM  Result Value Ref Range   WBC 7.8 3.6 - 11.0 K/uL   RBC 4.39 3.80 - 5.20 MIL/uL   Hemoglobin 12.9 12.0 - 16.0 g/dL   HCT 39.2 35.0 - 47.0 %   MCV 89.3 80.0 - 100.0 fL   MCH 29.4 26.0 - 34.0 pg   MCHC 33.0 32.0 - 36.0 g/dL   RDW 13.6 11.5 - 14.5 %   Platelets 382 150 - 440 K/uL   Neutrophils Relative % 53 %   Neutro Abs 4.2 1.4 - 6.5 K/uL   Lymphocytes Relative 38 %   Lymphs Abs 3.0 1.0 - 3.6 K/uL   Monocytes Relative 7 %   Monocytes Absolute 0.5 0.2 - 0.9 K/uL   Eosinophils Relative 1 %   Eosinophils Absolute 0.1 0 - 0.7 K/uL   Basophils Relative 1 %   Basophils Absolute 0.0 0 - 0.1 K/uL  Lipase, blood     Status: None   Collection Time: 10/22/17  3:38 PM  Result Value Ref Range   Lipase 51 11 - 51 U/L      PHQ2/9: Depression screen [facility] 2/9 11/10/2017 10/02/2017 11/11/2016 05/14/2016 02/13/2016  Decreased Interest 0 0 0 0 0  Down, Depressed, Hopeless 0 0 0 0 0  PHQ - 2 Score 0 0 0 0 0     Fall Risk: Fall Risk  11/10/2017 10/02/2017 11/11/2016 05/14/2016 02/13/2016  Falls in the past year? _0      Functional Status Survey: Is the patient deaf or have difficulty hearing?: No Does the patient have difficulty seeing, even when wearing glasses/contacts?: No Does the patient have difficulty concentrating, remembering, or making decisions?: No Does the patient have difficulty walking or climbing stairs?: No Does the patient have difficulty dressing or bathing?:  No Does the patient have difficulty doing errands alone such as visiting a doctor's office or shopping?: No    Assessment & Plan  1. Hyperlipidemia LDL goal <70  - ezetimibe (ZETIA) 10 MG tablet; Take 1 tablet (10 mg total) daily by mouth.  Dispense: 90 tablet; Refill: 1  2. Pelvic pressure in female  - Ambulatory referral to Obstetrics / Gynecology  3. Uterine leiomyoma, unspecified location  Refer to gyn   4. Prediabetes  Off medication   5. Insulin resistance   6. Hypertension goal BP (blood pressure) < 140/90  At goal  - olmesartan-hydrochlorothiazide (BENICAR HCT) 40-12.5 MG tablet; Take 1 tablet daily by mouth.  Dispense: 90 tablet; Refill: 1  7. Morbid obesity ([place])  Discussed with the patient the risk posed by an increased BMI. Discussed importance of portion control, calorie counting and at least 150 minutes of physical activity weekly. Avoid sweet beverages and drink more water. Eat at least 6 servings of fruit and vegetables daily   8. Cardiomyopathy due to hypertension, without heart failure ([place])   9. History of CVA (cerebrovascular accident)   10. Atherosclerosis of abdominal aorta ([place])  Explained importance of aspirin ( can't take it) , try zetia and bp control   11. Atherosclerosis of right coronary artery  No symptoms at this  time  12. Statin intolerance  Try zetia  13. Uterine prolapse  - Ambulatory referral to Obstetrics / Gynecology

## 2017-11-13 ENCOUNTER — Ambulatory Visit: Payer: Medicare Other | Admitting: Obstetrics and Gynecology

## 2017-11-13 ENCOUNTER — Encounter: Payer: Self-pay | Admitting: Obstetrics and Gynecology

## 2017-11-13 VITALS — BP 128/75 | HR 57 | Ht 59.0 in | Wt 207.8 lb

## 2017-11-13 DIAGNOSIS — N814 Uterovaginal prolapse, unspecified: Secondary | ICD-10-CM

## 2017-11-13 DIAGNOSIS — D259 Leiomyoma of uterus, unspecified: Secondary | ICD-10-CM | POA: Diagnosis not present

## 2017-11-13 NOTE — Progress Notes (Signed)
GYNECOLOGY CLINIC PROGRESS NOTE  Subjective:    Molly [name] is a 66 y.o. [id number] postmenopausal female who presents for evaluation of a uterine prolapse. Problem started a few years  ago and was mild, but has progressively worsened over the past 6 weeks. Patient states that she was moving furniture approximately 6 weeks ago and begun noticing abdominal pain.  Reports that after seeing her PCP she was diagnosed with a hernia.  Patient states that they gave her instructions on managing her hernia but she was still also feeling some lower pelvic pressure.  Notes that a CT scan was performed for her pain, and she was again noted to have a hernia, but was also noted to have fibroids. After this she reported back to her PCP who did a physical exam and noticed that her uterus had descended.  Referred her to a GYN. She denies leakage of urine, problems with emptying bladder or bowel problems.  States she just notes a lot of pressure in her lower pelvis. Reports that her PCP noted that it was time for her to consider hysterectomy.   Of note, patient states that she is scheduled for surgery on next Tuesday to repair her hernia.     Menstrual History: OB History    Gravida Para Term Preterm AB Living   2 2 2     2    SAB TAB Ectopic Multiple Live Births           2     Menarche age: 71 No LMP recorded. Patient is postmenopausal.    Past Medical History:  Diagnosis Date  . Asthma    history of asthma  . History of CVA (cerebrovascular accident) 12/10/2015  . Hyperlipidemia LDL goal <70 12/10/2015  . Hypertension   . Prediabetes 10/02/2017   A1c 6 in January 2018  . Stroke [facility])    1990    Family History  Problem Relation Age of Onset  . Gout Father   . Asthma Father   . Asthma Brother   . Dementia Brother   . Stroke Brother   . Cancer Paternal Grandmother   . Breast cancer Neg Hx     Past Surgical History:  Procedure Laterality Date  . CHOLECYSTECTOMY    . COLONOSCOPY WITH  PROPOFOL N/A 01/08/2017   Performed by [name], MD at [place] History   Socioeconomic History  . Marital status: Divorced    Spouse name: Not on file  . Number of children: Not on file  . Years of education: Not on file  . Highest education level: Not on file  Social Needs  . Financial resource strain: Not on file  . Food insecurity - worry: Not on file  . Food insecurity - inability: Not on file  . Transportation needs - medical: Not on file  . Transportation needs - non-medical: Not on file  Occupational History  . Not on file  Tobacco Use  . Smoking status: Never Smoker  . Smokeless tobacco: Never Used  Substance and Sexual Activity  . Alcohol use: No    Alcohol/week: 0.0 oz  . Drug use: No  . Sexual activity: No  Other Topics Concern  . Not on file  Social History Narrative  . Not on file    Current Outpatient Medications on File Prior to Visit  Medication Sig Dispense Refill  . acetaminophen (TYLENOL) 500 MG tablet Take 1 tablet (500 mg total) by mouth every 6 (six)  hours as needed. (Patient taking differently: Take 1,000 mg every 6 (six) hours as needed by mouth for moderate pain. ) 30 tablet 0  . albuterol (PROVENTIL HFA;VENTOLIN HFA) 108 (90 Base) MCG/ACT inhaler Inhale 2 puffs into the lungs every 4 (four) hours as needed for wheezing or shortness of breath. 1 Inhaler 0  . cetirizine (ZYRTEC) 10 MG tablet Take 1 tablet (10 mg total) by mouth daily as needed. 90 tablet 1  . diclofenac sodium (VOLTAREN) 1 % GEL Apply 2 g 3 (three) times daily as needed topically (pain).     Regino Schultze Bandages & Supports (MEDICAL COMPRESSION STOCKINGS) MISC 1 each by Does not apply route daily. Dx right lower extremity edema 20-30 pressure 2 each 2  . ezetimibe (ZETIA) 10 MG tablet Take 1 tablet (10 mg total) daily by mouth. 90 tablet 1  . furosemide (LASIX) 40 MG tablet Take 1 tablet (40 mg total) by mouth daily. 90 tablet 0  . Multiple Vitamin (MULTIVITAMIN WITH  MINERALS) TABS tablet Take 1 tablet 4 (four) times a week by mouth.    . olmesartan-hydrochlorothiazide (BENICAR HCT) 40-12.5 MG tablet Take 1 tablet daily by mouth. 90 tablet 1  . Triamcinolone Acetonide (NASACORT ALLERGY 24HR NA) Place 1 spray daily as needed into the nose (allergies).    . triamcinolone cream (KENALOG) 0.1 % Apply 1 application topically 2 (two) times daily. (Patient not taking: Reported on 11/13/2017) 30 g 0   No current facility-administered medications on file prior to visit.     Allergies  Allergen Reactions  . Nsaids Hives and Rash  . Statins Other (See Comments)    Joint pains  . Victoza [Liraglutide]     pancreatitis  . Crestor [Rosuvastatin Calcium] Rash     Review of Systems Pertinent items noted in HPI and remainder of comprehensive ROS otherwise negative.   Objective:     BP 128/75 (BP Location: Right Arm, Patient Position: Sitting, Cuff Size: Large)   Pulse (!) 57   Ht 4\' 11"  (1.499 m)   Wt 207 lb 12.8 oz (94.3 kg)   BMI 41.97 kg/m  General appearance: alert and no distress Abdomen: soft, non-tender; bowel sounds normal; no masses,  no organomegaly.  Small umbilical hernia present.  Pelvic:   Bladder no bladder distension noted  Vulva: normal appearing vulva with no masses, tenderness or lesions  Vagina: normal appearing vagina with normal color and discharge, no lesions and Pelvic Floor Exam uterine descensus grade 3 present (with Valsalva).  Grade 1 cystocele and rectocele  Cervix: normal appearing cervix without discharge or lesions  Uterus: Uterine size unable to be determined due to body habitus, but nontender, normal shape  Adnexa: normal adnexa in size, nontender and no masses  Rectal: not indicated Extremities: extremities normal, atraumatic, no cyanosis or edema Neurologic: Grossly normal   Imaging:  CLINICAL DATA:  Right-sided abdominal soreness over the last month  EXAM: CT ABDOMEN AND PELVIS WITH  CONTRAST  TECHNIQUE: Multidetector CT imaging of the abdomen and pelvis was performed using the standard protocol following bolus administration of intravenous contrast.  CONTRAST:  100 cc Isovue 3 7  COMPARISON:  CT abdomen pelvis of 10/05/2017  FINDINGS: Lower chest: The lung bases are clear. No infiltrate or effusion is seen. The heart is within normal limits in size. Some calcification is noted in the region of the right coronary artery although the coronary arteries are not well visualized on this study.  Hepatobiliary: The liver enhances with no focal  abnormality and no ductal dilatation is noted. The gallbladder has previously been resected.  Pancreas: The pancreas is normal in size in the pancreatic duct is not dilated.  Spleen: The spleen is normal in size.  Adrenals/Urinary Tract: The adrenal glands appear normal. The kidneys enhance with no calculus or mass. On delayed images, the pelvocaliceal systems are unremarkable. The ureters are normal in caliber. The urinary bladder is not well distended but no abnormality is seen.  Stomach/Bowel: The stomach is moderately distended with oral contrast. No small bowel distention is seen. No abnormality of the colon seen. The terminal ileum is unremarkable. The appendix is not visualized but no inflammatory process is seen within the right lower quadrant.  Vascular/Lymphatic: The abdominal aorta is normal in caliber with moderate abdominal aortic atherosclerosis present. No adenopathy is seen.  Reproductive: The uterus is somewhat irregular in outline consistent with multiple fibroids, the largest of 3.8 cm posteriorly. No adnexal lesion is seen. No significant fluid is seen within the pelvis.  Other: There is bulging of the anterior fascia at and just below the level of the umbilicus. But no focal herniation is seen.  Musculoskeletal: The lumbar vertebrae are in normal alignment. There is degenerative  disc disease at L5-S1. Degenerative change is present within the SI joints.  IMPRESSION: 1. No definite explanation for the patient's abdominal discomfort is seen. 2. Suspect multi fibroid uterus. 3. Bulging of the anterior fascial planes at and just below the level of the umbilicus but no discrete hernia is seen. 4. Moderate abdominal aortic atherosclerosis. 5. There is faint calcification noted in the distribution of the right coronary artery with the other coronary arteries not Visualized.   Assessment:    The patient has a uterine prolapse (Grade 3) with mild cystocele and rectocele (Grade 1)   Fibroid uterus  Plan:   - Discussion had regarding management of uterine prolapse and fibroid uterus, including conservative management with pessary and surgical management with hysterectomy. Discussed risks and benefits of both.  Patient initially desired surgery, would have liked to had it at the same time as her other surgery, however discussed that it might be difficulty to coordinate a dual surgery at such late notice as her hernia surgery is only a few days away, and that the recovery time for hysterectomy was longer than her anticipated hernia surgery repair.  Patient notes that that would be difficult as she is also the primary caregiver for her father (age 60), and that she only has help with him for a short time for her recovery, as I discussed that recovery from hysterectomy would be 4-6 weeks. Patient notes that she will just wait on a hysterectomy, and would like to try the pessary for now.  Will have patient return for pessary in 2-3 weeks, after recovery from hernia repair.  - Discussed that fibroids are usually asymptomatic in the postmenopausal state, as long as they are not fairly enlarged.  Discussed that she will require a pelvic ultrasound prior to surgical intervention to better assess size and location of the fibroids, as this may affect the method of performing the  hysterectomy.  Patient notes understanding.    A total of 30 minutes were spent face-to-face with the patient during the encounter with greater than 50% dealing with counseling and coordination of care.   Rubie Maid, MD [facility]

## 2017-11-15 ENCOUNTER — Encounter: Payer: Self-pay | Admitting: Obstetrics and Gynecology

## 2017-11-16 MED ORDER — CEFAZOLIN SODIUM-DEXTROSE 2-4 GM/100ML-% IV SOLN
2.0000 g | INTRAVENOUS | Status: AC
  Administered 2017-11-17: 2 g via INTRAVENOUS

## 2017-11-17 ENCOUNTER — Encounter: Payer: Self-pay | Admitting: Anesthesiology

## 2017-11-17 ENCOUNTER — Ambulatory Visit: Payer: Medicare Other | Admitting: Anesthesiology

## 2017-11-17 ENCOUNTER — Encounter
Admission: RE | Disposition: A | Discharge status: Home / Self Care | Payer: Self-pay | Source: Ambulatory Visit | Attending: Surgery

## 2017-11-17 ENCOUNTER — Ambulatory Visit
Admission: RE | Admit: 2017-11-17 | Discharge: 2017-11-17 | Disposition: A | Discharge status: Home / Self Care | Payer: Medicare Other | Source: Ambulatory Visit | Attending: Surgery | Admitting: Surgery

## 2017-11-17 DIAGNOSIS — I10 Essential (primary) hypertension: Secondary | ICD-10-CM | POA: Diagnosis not present

## 2017-11-17 DIAGNOSIS — Z6841 Body Mass Index (BMI) 40.0 and over, adult: Secondary | ICD-10-CM | POA: Diagnosis not present

## 2017-11-17 DIAGNOSIS — R7303 Prediabetes: Secondary | ICD-10-CM | POA: Insufficient documentation

## 2017-11-17 DIAGNOSIS — J45909 Unspecified asthma, uncomplicated: Secondary | ICD-10-CM | POA: Insufficient documentation

## 2017-11-17 DIAGNOSIS — Z886 Allergy status to analgesic agent status: Secondary | ICD-10-CM | POA: Diagnosis not present

## 2017-11-17 DIAGNOSIS — E785 Hyperlipidemia, unspecified: Secondary | ICD-10-CM | POA: Diagnosis not present

## 2017-11-17 DIAGNOSIS — Z8673 Personal history of transient ischemic attack (TIA), and cerebral infarction without residual deficits: Secondary | ICD-10-CM | POA: Diagnosis not present

## 2017-11-17 DIAGNOSIS — K429 Umbilical hernia without obstruction or gangrene: Secondary | ICD-10-CM

## 2017-11-17 DIAGNOSIS — I2510 Atherosclerotic heart disease of native coronary artery without angina pectoris: Secondary | ICD-10-CM | POA: Insufficient documentation

## 2017-11-17 DIAGNOSIS — Z888 Allergy status to other drugs, medicaments and biological substances status: Secondary | ICD-10-CM | POA: Diagnosis not present

## 2017-11-17 DIAGNOSIS — Z79899 Other long term (current) drug therapy: Secondary | ICD-10-CM | POA: Insufficient documentation

## 2017-11-17 DIAGNOSIS — I739 Peripheral vascular disease, unspecified: Secondary | ICD-10-CM | POA: Diagnosis not present

## 2017-11-17 HISTORY — PX: UMBILICAL HERNIA REPAIR: SHX196

## 2017-11-17 HISTORY — DX: Gastro-esophageal reflux disease without esophagitis: K21.9

## 2017-11-17 SURGERY — REPAIR, HERNIA, UMBILICAL, ADULT
Anesthesia: General | Wound class: Clean Contaminated

## 2017-11-17 MED ORDER — ACETAMINOPHEN 10 MG/ML IV SOLN
INTRAVENOUS | Status: DC | PRN
  Administered 2017-11-17: 1000 mg via INTRAVENOUS

## 2017-11-17 MED ORDER — OXYCODONE HCL 5 MG PO TABS
ORAL_TABLET | ORAL | Status: AC
  Administered 2017-11-17: 5 mg via ORAL
  Filled 2017-11-17: qty 1

## 2017-11-17 MED ORDER — ONDANSETRON HCL 4 MG/2ML IJ SOLN
INTRAMUSCULAR | Status: AC
  Administered 2017-11-17: 4 mg via INTRAVENOUS
  Filled 2017-11-17: qty 2

## 2017-11-17 MED ORDER — PROPOFOL 10 MG/ML IV BOLUS
INTRAVENOUS | Status: AC
  Filled 2017-11-17: qty 20

## 2017-11-17 MED ORDER — LIDOCAINE HCL (CARDIAC) 20 MG/ML IV SOLN
INTRAVENOUS | Status: DC | PRN
  Administered 2017-11-17 (×2): 40 mg via INTRAVENOUS

## 2017-11-17 MED ORDER — CHLORHEXIDINE GLUCONATE CLOTH 2 % EX PADS: 6.0000 | MEDICATED_PAD | Freq: Once | CUTANEOUS | Status: DC

## 2017-11-17 MED ORDER — FAMOTIDINE 20 MG PO TABS
ORAL_TABLET | ORAL | Status: AC
  Administered 2017-11-17: 20 mg via ORAL
  Filled 2017-11-17: qty 1

## 2017-11-17 MED ORDER — FENTANYL CITRATE (PF) 100 MCG/2ML IJ SOLN
INTRAMUSCULAR | Status: AC
  Filled 2017-11-17: qty 2

## 2017-11-17 MED ORDER — FENTANYL CITRATE (PF) 100 MCG/2ML IJ SOLN
25.0000 ug | INTRAMUSCULAR | Status: DC | PRN
  Administered 2017-11-17 (×4): 25 ug via INTRAVENOUS

## 2017-11-17 MED ORDER — LACTATED RINGERS IV SOLN
INTRAVENOUS | Status: DC
  Administered 2017-11-17: 75 mL/h via INTRAVENOUS
  Administered 2017-11-17: 10:00:00 via INTRAVENOUS

## 2017-11-17 MED ORDER — DEXAMETHASONE SODIUM PHOSPHATE 10 MG/ML IJ SOLN
INTRAMUSCULAR | Status: DC | PRN
  Administered 2017-11-17: 10 mg via INTRAVENOUS

## 2017-11-17 MED ORDER — FENTANYL CITRATE (PF) 100 MCG/2ML IJ SOLN
INTRAMUSCULAR | Status: AC
  Administered 2017-11-17: 25 ug via INTRAVENOUS
  Filled 2017-11-17: qty 2

## 2017-11-17 MED ORDER — EPHEDRINE SULFATE 50 MG/ML IJ SOLN
INTRAMUSCULAR | Status: DC | PRN
  Administered 2017-11-17: 10 mg via INTRAVENOUS

## 2017-11-17 MED ORDER — FENTANYL CITRATE (PF) 100 MCG/2ML IJ SOLN
INTRAMUSCULAR | Status: DC | PRN
  Administered 2017-11-17 (×2): 50 ug via INTRAVENOUS

## 2017-11-17 MED ORDER — SUGAMMADEX SODIUM 200 MG/2ML IV SOLN
INTRAVENOUS | Status: AC
  Filled 2017-11-17: qty 2

## 2017-11-17 MED ORDER — FENTANYL CITRATE (PF) 100 MCG/2ML IJ SOLN: INTRAMUSCULAR | Status: DC | PRN

## 2017-11-17 MED ORDER — BUPIVACAINE-EPINEPHRINE (PF) 0.25% -1:200000 IJ SOLN
INTRAMUSCULAR | Status: AC
  Filled 2017-11-17: qty 30

## 2017-11-17 MED ORDER — BUPIVACAINE-EPINEPHRINE (PF) 0.25% -1:200000 IJ SOLN
INTRAMUSCULAR | Status: DC | PRN
  Administered 2017-11-17: 30 mL via PERINEURAL

## 2017-11-17 MED ORDER — MIDAZOLAM HCL 2 MG/2ML IJ SOLN
INTRAMUSCULAR | Status: DC | PRN
  Administered 2017-11-17: 2 mg via INTRAVENOUS

## 2017-11-17 MED ORDER — MIDAZOLAM HCL 2 MG/2ML IJ SOLN
INTRAMUSCULAR | Status: AC
  Filled 2017-11-17: qty 2

## 2017-11-17 MED ORDER — DEXAMETHASONE SODIUM PHOSPHATE 10 MG/ML IJ SOLN
INTRAMUSCULAR | Status: AC
  Filled 2017-11-17: qty 1

## 2017-11-17 MED ORDER — ONDANSETRON HCL 4 MG/2ML IJ SOLN
INTRAMUSCULAR | Status: AC
  Filled 2017-11-17: qty 2

## 2017-11-17 MED ORDER — OXYCODONE HCL 5 MG PO TABS
5.0000 mg | ORAL_TABLET | Freq: Once | ORAL | Status: AC
  Administered 2017-11-17: 5 mg via ORAL
  Filled 2017-11-17: qty 1

## 2017-11-17 MED ORDER — ONDANSETRON HCL 4 MG/2ML IJ SOLN
4.0000 mg | Freq: Once | INTRAMUSCULAR | Status: AC | PRN
  Administered 2017-11-17: 4 mg via INTRAVENOUS

## 2017-11-17 MED ORDER — ONDANSETRON HCL 4 MG/2ML IJ SOLN
INTRAMUSCULAR | Status: DC | PRN
  Administered 2017-11-17: 4 mg via INTRAVENOUS

## 2017-11-17 MED ORDER — CEFAZOLIN SODIUM-DEXTROSE 2-4 GM/100ML-% IV SOLN
INTRAVENOUS | Status: AC
  Filled 2017-11-17: qty 100

## 2017-11-17 MED ORDER — ACETAMINOPHEN 10 MG/ML IV SOLN
INTRAVENOUS | Status: AC
  Filled 2017-11-17: qty 100

## 2017-11-17 MED ORDER — SUGAMMADEX SODIUM 200 MG/2ML IV SOLN
INTRAVENOUS | Status: DC | PRN
  Administered 2017-11-17: 187.8 mg via INTRAVENOUS

## 2017-11-17 MED ORDER — PROPOFOL 10 MG/ML IV BOLUS
INTRAVENOUS | Status: DC | PRN
  Administered 2017-11-17: 130 mg via INTRAVENOUS

## 2017-11-17 MED ORDER — ROCURONIUM BROMIDE 50 MG/5ML IV SOLN
INTRAVENOUS | Status: AC
  Filled 2017-11-17: qty 1

## 2017-11-17 MED ORDER — FAMOTIDINE 20 MG PO TABS
20.0000 mg | ORAL_TABLET | Freq: Once | ORAL | Status: AC
  Administered 2017-11-17: 20 mg via ORAL

## 2017-11-17 MED ORDER — ROCURONIUM BROMIDE 100 MG/10ML IV SOLN
INTRAVENOUS | Status: DC | PRN
  Administered 2017-11-17: 40 mg via INTRAVENOUS

## 2017-11-17 MED ORDER — OXYCODONE-ACETAMINOPHEN 5-325 MG PO TABS: 1.0000 | ORAL_TABLET | ORAL | 0 refills | Status: DC | PRN

## 2017-11-17 MED ORDER — HYDROCODONE-ACETAMINOPHEN 5-325 MG PO TABS: 1.0000 | ORAL_TABLET | Freq: Four times a day (QID) | ORAL | 0 refills | Status: DC | PRN

## 2017-11-17 SURGICAL SUPPLY — 26 items
APPLIER CLIP 11 MED OPEN (CLIP) ×2
BLADE CLIPPER SURG (BLADE) ×2 IMPLANT
CANISTER SUCT 1200ML W/VALVE (MISCELLANEOUS) ×2 IMPLANT
CHLORAPREP W/TINT 26ML (MISCELLANEOUS) ×2 IMPLANT
CLIP APPLIE 11 MED OPEN (CLIP) ×1 IMPLANT
DERMABOND ADVANCED (GAUZE/BANDAGES/DRESSINGS) ×1
DERMABOND ADVANCED .7 DNX12 (GAUZE/BANDAGES/DRESSINGS) ×1 IMPLANT
DRAPE INCISE IOBAN 66X45 STRL (DRAPES) IMPLANT
DRAPE PED LAPAROTOMY (DRAPES) ×2 IMPLANT
DRSG TELFA 3X8 NADH (GAUZE/BANDAGES/DRESSINGS) ×2 IMPLANT
ELECT REM PT RETURN 9FT ADLT (ELECTROSURGICAL) ×2
ELECTRODE REM PT RTRN 9FT ADLT (ELECTROSURGICAL) ×1 IMPLANT
GLOVE BIO SURGEON STRL SZ7 (GLOVE) ×2 IMPLANT
GOWN STRL REUS W/ TWL LRG LVL3 (GOWN DISPOSABLE) ×2 IMPLANT
GOWN STRL REUS W/TWL LRG LVL3 (GOWN DISPOSABLE) ×2
MESH VENTRALEX ST 2.5 CRC MED (Mesh General) ×2 IMPLANT
NEEDLE HYPO 22GX1.5 SAFETY (NEEDLE) ×2 IMPLANT
NS IRRIG 500ML POUR BTL (IV SOLUTION) ×2 IMPLANT
PACK BASIN MINOR ARMC (MISCELLANEOUS) ×2 IMPLANT
SPONGE LAP 18X18 5 PK (GAUZE/BANDAGES/DRESSINGS) ×2 IMPLANT
SUT ETHIBOND 0 MO6 C/R (SUTURE) ×4 IMPLANT
SUT ETHIBOND NAB MO 7 #0 18IN (SUTURE) ×2 IMPLANT
SUT MNCRL AB 4-0 PS2 18 (SUTURE) ×2 IMPLANT
SUT VIC AB 2-0 SH 27 (SUTURE) ×1
SUT VIC AB 2-0 SH 27XBRD (SUTURE) ×1 IMPLANT
SYR 20CC LL (SYRINGE) ×2 IMPLANT

## 2017-11-17 NOTE — Anesthesia Procedure Notes (Signed)
Procedure Name: Intubation Date/Time: 11/17/2017 10:20 AM Performed by: Allean Found, CRNA Pre-anesthesia Checklist: Patient identified, Emergency Drugs available, Suction available, Patient being monitored and Timeout performed Patient Re-evaluated:Patient Re-evaluated prior to induction Oxygen Delivery Method: Circle system utilized Preoxygenation: Pre-oxygenation with 100% oxygen Induction Type: IV induction Ventilation: Mask ventilation without difficulty Laryngoscope Size: Mac and 3 Grade View: Grade II Tube type: Oral Tube size: 7.0 mm Number of attempts: 1 Airway Equipment and Method: Patient positioned with wedge pillow and Stylet Placement Confirmation: ETT inserted through vocal cords under direct vision,  positive ETCO2 and breath sounds checked- equal and bilateral Secured at: 21 cm Tube secured with: Tape Dental Injury: Teeth and Oropharynx as per pre-operative assessment

## 2017-11-17 NOTE — Anesthesia Preprocedure Evaluation (Addendum)
Anesthesia Evaluation  Patient identified by MRN, date of birth, ID band Patient awake    Reviewed: Allergy & Precautions, NPO status , Patient's Chart, lab work & pertinent test results, reviewed documented beta blocker date and time   Airway Mallampati: III  TM Distance: >3 FB     Dental  (+) Chipped, Missing   Pulmonary asthma ,           Cardiovascular hypertension, Pt. on medications + CAD and + Peripheral Vascular Disease       Neuro/Psych CVA    GI/Hepatic   Endo/Other  Morbid obesity  Renal/GU      Musculoskeletal   Abdominal   Peds  Hematology   Anesthesia Other Findings Sounds like she had a TIA in 2016.  Reproductive/Obstetrics                            Anesthesia Physical Anesthesia Plan  ASA: III  Anesthesia Plan: General   Post-op Pain Management:    Induction: Intravenous  PONV Risk Score and Plan:   Airway Management Planned: Oral ETT  Additional Equipment:   Intra-op Plan:   Post-operative Plan:   Informed Consent: I have reviewed the patients History and Physical, chart, labs and discussed the procedure including the risks, benefits and alternatives for the proposed anesthesia with the patient or authorized representative who has indicated his/her understanding and acceptance.     Plan Discussed with: CRNA  Anesthesia Plan Comments:        Anesthesia Quick Evaluation

## 2017-11-17 NOTE — Anesthesia Post-op Follow-up Note (Signed)
Anesthesia QCDR form completed.        

## 2017-11-17 NOTE — OR Nursing (Signed)
Waited for 3 hours for Dr Dahlia Byes to come change pts prescription. pts daughter states mother cannot take cueent prescription of hydrocodone that she needs Percocet. Prescription given for percocet and pt discharged at this time.

## 2017-11-17 NOTE — Op Note (Signed)
Umbilical Hernia Repair w Mesh (Ventralex ST 6.4 cm  Pre-operative Diagnosis: Symptomatic UH  Post-operative Diagnosis: Same  Surgeon: Caroleen Hamman, MD FACS  Anesthesia: Gen. with endotracheal tube   Findings: 3 cms UH  Estimated Blood Loss: 10cc                 Specimens: sac         Complications: none              Procedure Details  The patient was seen again in the Holding Room. The benefits, complications, treatment options, and expected outcomes were discussed with the patient. The risks of bleeding, infection, recurrence of symptoms, failure to resolve symptoms, bowel injury, mesh placement, mesh infection, any of which could require further surgery were reviewed with the patient. The likelihood of improving the patient's symptoms with return to their baseline status is good.  The patient and/or family concurred with the proposed plan, giving informed consent.  The patient was taken to Operating Room, identified as Molly [name] and the procedure verified.  A Time Out was held and the above information confirmed.  Prior to the induction of general anesthesia, antibiotic prophylaxis was administered. VTE prophylaxis was in place. General endotracheal anesthesia was then administered and tolerated well. After the induction, the abdomen was prepped with Chloraprep and draped in the sterile fashion. The patient was positioned in the supine position.   Incision was created with a scalpel over the hernia defect. Electrocautery was used to dissect through subcutaneous tissue, the hernia sac was opened and the Hernia sac was excised. The hernia was measured and the mesh was selected. We used 6.4 cm Ventrales ST patch.  Interrupted Vicryl sutures were placed on each corner of the mesh and secure using transfascial ethibond sutures.  I closed the hernia defect with interrupted 0 Ethibond sutures. Using a knife I resected the redundant umbilicus to avoid seroma and wound infection. We  performed an umbilicoplasty using interrupted 2-0 vicryls and 4-0  4-0 Monocryl. Dermabond was used to coat the skin. Marcaine quarter percent with epinephrine and lidocaine 1% was used to inject all the incision sites. Patient tolerated procedure well and there were no immediate complications. Needle and laparotomy counts were correct   Caroleen Hamman, MD, FACS

## 2017-11-17 NOTE — Transfer of Care (Signed)
Immediate Anesthesia Transfer of Care Note  Patient: Molly [name]  Procedure(s) Performed: HERNIA REPAIR UMBILICAL ADULT (N/A )  Patient Location: PACU  Anesthesia Type:General  Level of Consciousness: sedated  Airway & Oxygen Therapy: Patient Spontanous Breathing and Patient connected to face mask oxygen  Post-op Assessment: Report given to RN and Post -op Vital signs reviewed and stable  Post vital signs: Reviewed and stable  Last Vitals:  Vitals:   11/17/17 0932 11/17/17 1110  BP: (!) 145/75 133/72  Pulse: (!) 58 78  Resp: 16 14  Temp: 36.7 C (!) 36.1 C  SpO2: 95% 100%    Last Pain:  Vitals:   11/17/17 0932  TempSrc: Oral  PainSc: 2       Patients Stated Pain Goal: 0 (84/85/92 7639)  Complications: No apparent anesthesia complications

## 2017-11-17 NOTE — Anesthesia Postprocedure Evaluation (Signed)
Anesthesia Post Note  Patient: Molly [name]  Procedure(s) Performed: HERNIA REPAIR UMBILICAL ADULT (N/A )  Patient location during evaluation: PACU Anesthesia Type: General Level of consciousness: awake and alert Pain management: pain level controlled Vital Signs Assessment: post-procedure vital signs reviewed and stable Respiratory status: spontaneous breathing, nonlabored ventilation, respiratory function stable and patient connected to nasal cannula oxygen Cardiovascular status: blood pressure returned to baseline and stable Postop Assessment: no apparent nausea or vomiting Anesthetic complications: no     Last Vitals:  Vitals:   11/17/17 1221 11/17/17 1244  BP: (!) 140/58 124/74  Pulse: (!) 59 (!) 57  Resp:  16  Temp: (!) 36 C   SpO2: 93% 94%    Last Pain:  Vitals:   11/17/17 1221  TempSrc:   PainSc: [place]

## 2017-11-17 NOTE — Interval H&P Note (Signed)
History and Physical Interval Note:  45/85/9292 4:46 AM  Molly [name]  has presented today for surgery, with the diagnosis of UMBILICAL HERNIA  The various methods of treatment have been discussed with the patient and family. After consideration of risks, benefits and other options for treatment, the patient has consented to  Procedure(s): HERNIA REPAIR UMBILICAL ADULT (N/A) as a surgical intervention .  The patient's history has been reviewed, patient examined, no change in status, stable for surgery.  I have reviewed the patient's chart and labs.  Questions were answered to the patient's satisfaction.     [place]

## 2017-11-17 NOTE — Discharge Instructions (Signed)

## 2017-11-18 LAB — SURGICAL PATHOLOGY

## 2017-11-24 ENCOUNTER — Encounter: Payer: Medicare Other | Admitting: Obstetrics and Gynecology

## 2017-11-26 ENCOUNTER — Encounter: Payer: Self-pay | Admitting: Surgery

## 2017-11-26 ENCOUNTER — Ambulatory Visit (INDEPENDENT_AMBULATORY_CARE_PROVIDER_SITE_OTHER): Payer: Medicare Other | Admitting: Surgery

## 2017-11-26 VITALS — BP 149/83 | HR 61 | Temp 98.0°F | Ht 59.0 in | Wt 210.0 lb

## 2017-11-26 DIAGNOSIS — Z09 Encounter for follow-up examination after completed treatment for conditions other than malignant neoplasm: Secondary | ICD-10-CM

## 2017-11-26 MED ORDER — TRAMADOL HCL 50 MG PO TABS: 50.0000 mg | ORAL_TABLET | Freq: Four times a day (QID) | ORAL | 0 refills | Status: DC | PRN

## 2017-11-26 NOTE — Progress Notes (Signed)
S/p UH 11/20 Doing well Main issue is N/V w percocet She has taken tramadol and she tolerates it well NO fevers, + PO  PE NAD Abd: soft, nt, incision healing well, no recurrence or peritonitis  A/P Doing well We will prescribe short course tramadol Ice pack and tylenol  No heavy lifting

## 2017-11-26 NOTE — Patient Instructions (Signed)

## 2017-12-01 ENCOUNTER — Ambulatory Visit (INDEPENDENT_AMBULATORY_CARE_PROVIDER_SITE_OTHER): Payer: Medicare Other | Admitting: Obstetrics and Gynecology

## 2017-12-01 ENCOUNTER — Encounter: Payer: Self-pay | Admitting: Obstetrics and Gynecology

## 2017-12-01 VITALS — BP 135/76 | HR 60 | Ht 59.0 in | Wt 204.1 lb

## 2017-12-01 DIAGNOSIS — D219 Benign neoplasm of connective and other soft tissue, unspecified: Secondary | ICD-10-CM | POA: Diagnosis not present

## 2017-12-01 DIAGNOSIS — N812 Incomplete uterovaginal prolapse: Secondary | ICD-10-CM | POA: Diagnosis not present

## 2017-12-01 DIAGNOSIS — Z9889 Other specified postprocedural states: Secondary | ICD-10-CM | POA: Diagnosis not present

## 2017-12-01 DIAGNOSIS — Z8719 Personal history of other diseases of the digestive system: Secondary | ICD-10-CM

## 2017-12-01 NOTE — Progress Notes (Signed)
    GYNECOLOGY PROGRESS NOTE  Subjective:    Patient ID: Molly [name], female    DOB: June 22, 1951, 66 y.o.   MRN: [id number]  HPI  Patient is a 66 y.o. G78P2002 female who presents for pessary fitting.  Currently with h/o fibroid uterus and Grade 3 uterine prolapse with Grade 1 cystocele and rectocele. Of note, patient is also ~ 2 weeks s/p umbilical hernia repair.  Notes some soreness of the abdomen but otherwise doing well today.   The following portions of the patient's history were reviewed and updated as appropriate: allergies, current medications, past family history, past medical history, past social history, past surgical history and problem list.  Review of Systems Pertinent items noted in HPI and remainder of comprehensive ROS otherwise negative.   Objective:   Blood pressure 135/76, pulse 60, height 4\' 11"  (1.499 m), weight 204 lb 1.6 oz (92.6 kg). General appearance: alert and no distress Abdomen: soft, non-tender; bowel sounds normal; no masses,  no organomegaly. Supra-umbilical incision site healing well, no erythema or drainage present.  Pelvic: external genitalia normal, rectovaginal septum normal.  Vagina without discharge. Pelvic Floor Exam uterine descensus grade 3 present (with Valsalva).  Grade 1 cystocele and rectocele.  Cervix normal appearing, no lesions and no motion tenderness.  Uterus mobile, nontender, normal shape and size.  Adnexae non-palpable, nontender bilaterally.  Extremities: extremities normal, atraumatic, no cyanosis or edema Neurologic: Grossly normal   Assessment:   Uterine prolapse (Grade 3) with mild cystocele and rectocele (Grade 1)   Fibroid uterus S/p hernia repair  Plan:   1. Pessary fitting performed today. Will need to order size 2 1/2 Shaatz pessary. Will f/u in 1-2 weeks for pessary insertion.  2. Fibroids small, do not require intervention currently.   3. S/p hernia repair, doing well overall.  Has f/u with General Surgery.     Molly Maid, MD [facility]

## 2017-12-04 ENCOUNTER — Other Ambulatory Visit: Payer: Self-pay | Admitting: Family Medicine

## 2017-12-04 DIAGNOSIS — I10 Essential (primary) hypertension: Secondary | ICD-10-CM

## 2017-12-04 MED ORDER — FUROSEMIDE 40 MG PO TABS: 40.0000 mg | ORAL_TABLET | Freq: Every day | ORAL | 0 refills | Status: DC

## 2017-12-04 NOTE — Telephone Encounter (Signed)
Copied from [place] [id number]. Topic: Quick Communication - See Telephone Encounter >> Dec 04, 2017  2:44 PM [employer] wrote: CRM for notification. See Telephone encounter for: pt is requesting a refill on lasix she states she has aways requested thru office  Best number is [phone] [facility] [address]    12/04/17.

## 2017-12-04 NOTE — Telephone Encounter (Signed)
Refill request for general medication: Lasix 40 mg  Last office visit: 11/10/2017  Last physical exam: 11/11/2016  Follow up visit: None indicated

## 2017-12-11 ENCOUNTER — Ambulatory Visit: Payer: Medicare Other | Admitting: Obstetrics and Gynecology

## 2017-12-11 ENCOUNTER — Encounter: Payer: Self-pay | Admitting: Obstetrics and Gynecology

## 2017-12-11 VITALS — BP 138/79 | HR 71 | Ht 59.0 in | Wt 207.0 lb

## 2017-12-11 DIAGNOSIS — N814 Uterovaginal prolapse, unspecified: Secondary | ICD-10-CM

## 2017-12-11 DIAGNOSIS — Z4689 Encounter for fitting and adjustment of other specified devices: Secondary | ICD-10-CM | POA: Diagnosis not present

## 2017-12-11 NOTE — Progress Notes (Signed)
    GYNECOLOGY PROGRESS NOTE  Subjective:    Patient ID: Molly [name], female    DOB: Feb 06, 1951, 66 y.o.   MRN: [id number]  HPI  Patient is a 66 y.o. G51P2002 female who presents for pessary insertion.  She currently has a Grade 3 uterine prolapse with Grade 1 cystocele and rectocele. Notes she is feeling good today. Has completely healed from hernia repair.   The following portions of the patient's history were reviewed and updated as appropriate: allergies, current medications, past family history, past medical history, past social history, past surgical history and problem list.  Review of Systems Pertinent items noted in HPI and remainder of comprehensive ROS otherwise negative.   Objective:   Blood pressure 138/79, pulse 71, height 4\' 11"  (1.499 m), weight 207 lb (93.9 kg). General appearance: alert and no distress Pelvis:    Vulva: normal appearing vulva with no masses, tenderness or lesions             Vagina: normal appearing vagina with normal color and discharge, no lesions and Pelvic Floor Exam uterine descensus grade 3 present (with Valsalva).  Grade 1 cystocele and rectocele             Cervix: normal appearing cervix without discharge or lesions             Uterus: Uterine size unable to be determined due to body habitus, but nontender, normal shape             Adnexa: normal adnexa in size, nontender and no masses             Rectal: not indicated Remainder of exam deferred.   Assessment:   Pessary insertion Uterine prolapse with Grade 1 cystocele/rectocele.  Plan:   - Size 2 1/2 Shaatz pessary was inserted today. Advised on use of [place] gel at least every 1-2 weeks.  - RTC in 2-3 weeks for f/u of pessary and prolapse symptoms.

## 2017-12-12 ENCOUNTER — Encounter: Payer: Self-pay | Admitting: Obstetrics and Gynecology

## 2017-12-13 ENCOUNTER — Encounter: Payer: Self-pay | Admitting: Obstetrics and Gynecology

## 2017-12-16 ENCOUNTER — Encounter: Payer: Self-pay | Admitting: Surgery

## 2018-01-05 ENCOUNTER — Encounter: Payer: Self-pay | Admitting: Obstetrics and Gynecology

## 2018-01-05 ENCOUNTER — Ambulatory Visit (INDEPENDENT_AMBULATORY_CARE_PROVIDER_SITE_OTHER): Payer: Medicare Other | Admitting: Obstetrics and Gynecology

## 2018-01-05 VITALS — BP 127/76 | HR 71 | Wt 205.6 lb

## 2018-01-05 DIAGNOSIS — N814 Uterovaginal prolapse, unspecified: Secondary | ICD-10-CM | POA: Diagnosis not present

## 2018-01-05 DIAGNOSIS — Z4689 Encounter for fitting and adjustment of other specified devices: Secondary | ICD-10-CM | POA: Diagnosis not present

## 2018-01-05 NOTE — Progress Notes (Signed)
    GYNECOLOGY PROGRESS NOTE  Subjective:    Patient ID: Molly [name], female    DOB: [birth year], 67 y.o.   MRN: [id number]  HPI  Patient is a 67 y.o. G59P2002 female who presents for pessary resizing. Patient had a size 2/1/2 Shaatz pessary that was inserted approximately 2 weeks ago for a Grade 3 uterine prolapse with Grade 1 cystocele and rectocele.  Molly [name] states that the pessary was only in place x 1 day, but was dislodged after having to strain for a bowel movement.  Notes that overall she does not experience constipation but this particular bowel movement was more difficult to pass.   The following portions of the patient's history were reviewed and updated as appropriate: allergies, current medications, past family history, past medical history, past social history, past surgical history and problem list.  Review of Systems Pertinent items noted in HPI and remainder of comprehensive ROS otherwise negative.   Objective:   Blood pressure 127/76, pulse 71, weight 205 lb 9 oz (93.2 kg). General appearance: alert and no distress Abdomen: soft, non-tender; bowel sounds normal; no masses,  no organomegaly Pelvic: Pelvis:             Vulva: normal appearing vulva with no masses, tenderness or lesions Vagina: normal appearing vagina with normal color and discharge, no lesions and Pelvic Floor Examuterine descensus grade3 present (with Valsalva). Grade 1 cystocele and rectocele Cervix: normal appearing cervix without discharge or lesions Uterus: Uterine size unable to be determined due to body habitus, but nontender, normal shape Adnexa: normal adnexa    Assessment:   Pessary fitting Uterine prolapse with cystocele/rectocele  Plan:   Pessary resizing performed today.  WIll order size Gellhorn 2 3/4 pessary with short stem.  Patient to return in 2 weeks for pessary insertion.    Molly Maid, MD [facility]

## 2018-01-05 NOTE — Progress Notes (Signed)
Pt is here for a pessary check and reinsertion. States it fell out after she had a BM it came out.

## 2018-01-19 ENCOUNTER — Encounter: Payer: Self-pay | Admitting: Obstetrics and Gynecology

## 2018-01-19 ENCOUNTER — Ambulatory Visit (INDEPENDENT_AMBULATORY_CARE_PROVIDER_SITE_OTHER): Payer: Medicare Other | Admitting: Obstetrics and Gynecology

## 2018-01-19 VITALS — BP 119/71 | HR 72 | Ht 59.0 in | Wt 209.8 lb

## 2018-01-19 DIAGNOSIS — Z4689 Encounter for fitting and adjustment of other specified devices: Secondary | ICD-10-CM

## 2018-01-19 DIAGNOSIS — N814 Uterovaginal prolapse, unspecified: Secondary | ICD-10-CM

## 2018-01-19 NOTE — Progress Notes (Signed)
    GYNECOLOGY PROGRESS NOTE  Subjective:    Patient ID: Molly [name], female    DOB: [birth year], 67 y.o.   MRN: [id number]  HPI  Patient is a 67 y.o. G50P2002 female who presents for new pessary insertion. Previous pessary (2 1/2 Tish Frederickson) was expelled after 1-2 days of use.  She currently has a Grade 3 uterine prolapse with Grade 1 cystocele and rectocele. Notes she is otherwise feeling good today.   The following portions of the patient's history were reviewed and updated as appropriate: allergies, current medications, past family history, past medical history, past social history, past surgical history and problem list.  Review of Systems Pertinent items noted in HPI and remainder of comprehensive ROS otherwise negative.   Objective:   Blood pressure 119/71, pulse 72, height 4\' 11"  (1.499 m), weight 209 lb 12.8 oz (95.2 kg). General appearance: alert and no distress Pelvis:    Vulva: normal appearing vulva with no masses, tenderness or lesions             Vagina: normal appearing vagina with normal color and discharge, no lesions and Pelvic Floor Exam uterine descensus grade 3 present (with Valsalva).  Grade 1 cystocele and rectocele             Cervix: normal appearing cervix without discharge or lesions             Uterus: Uterine size unable to be determined due to body habitus, but nontender, normal shape             Adnexa: normal adnexa in size, nontender and no masses             Rectal: not indicated Remainder of exam deferred.   Assessment:   Pessary insertion Uterine prolapse with Grade 1 cystocele/rectocele.  Plan:   - Size 2 3/4 Gelhorn pessary with short knob was inserted today. Advised on use of Trimo-San gel at least every 1-2 weeks.  - RTC in 2-3 weeks for f/u of pessary and prolapse symptoms.   Rubie Maid, MD [facility]

## 2018-01-20 ENCOUNTER — Encounter: Payer: Self-pay | Admitting: Obstetrics and Gynecology

## 2018-01-20 ENCOUNTER — Ambulatory Visit (INDEPENDENT_AMBULATORY_CARE_PROVIDER_SITE_OTHER): Payer: Medicare Other | Admitting: Obstetrics and Gynecology

## 2018-01-20 VITALS — BP 144/85 | HR 69 | Ht 59.0 in | Wt 210.7 lb

## 2018-01-20 DIAGNOSIS — N841 Polyp of cervix uteri: Secondary | ICD-10-CM | POA: Diagnosis not present

## 2018-01-20 DIAGNOSIS — R102 Pelvic and perineal pain: Secondary | ICD-10-CM

## 2018-01-20 DIAGNOSIS — Z9289 Personal history of other medical treatment: Secondary | ICD-10-CM | POA: Diagnosis not present

## 2018-01-20 DIAGNOSIS — Z960 Presence of urogenital implants: Secondary | ICD-10-CM

## 2018-01-20 NOTE — Progress Notes (Signed)
    GYNECOLOGY PROGRESS NOTE  Subjective:    Patient ID: Molly [name], female    DOB: February 17, 1951, 67 y.o.   MRN: [id number]  HPI  Patient is a 67 y.o. G50P2002 female who presents for complaints of moderate pelvic pain and pressure since pessary was inserted yesterday for treatment of uterine prolapse and cystocele. She denies vaginal bleeding. Is having trouble walking due to the pain.  She has taken 3 ES Tylenols without much relief. Denies difficulty passing urine. Has not had a BM today.   The following portions of the patient's history were reviewed and updated as appropriate: allergies, current medications, past family history, past medical history, past social history, past surgical history and problem list.  Review of Systems Pertinent items noted in HPI and remainder of comprehensive ROS otherwise negative.   Objective:   Blood pressure (!) 144/85, pulse 69, height 4\' 11"  (1.499 m), weight 210 lb 11.2 oz (95.6 kg). General appearance: alert and mild distress Pelvic: ?The patient's Size  2 3/4 pessary was removed, cleaned and replaced without complications (was noted to be lower in vaginal canal than expected). Speculum examination revealed normal vaginal mucosa with no lesions or lacerations. Cervix revealed a moderate sized endocervical polyp.   Assessment:   Pelvic pain Pessary in situ Grade 3 uterine prolapse with cystocele Cervical polyp ] Plan:   1. Pessary removed, cleaned, reinserted, checking for adequate positioning.  If pain continues, will likely need to revert back to previous pessary with instructions on prevention of expulsion.  Or can reconsider surgical options. Continue use of Trimo-san gel.  2. Cervical polyp removed today using ring forceps. Will send to pathology. Given bleeding precautions.   RTC if symptoms worsen or fail to improve.    Rubie Maid, MD [facility]

## 2018-01-22 LAB — PATHOLOGY

## 2018-02-09 ENCOUNTER — Encounter: Payer: Self-pay | Admitting: Obstetrics and Gynecology

## 2018-02-09 ENCOUNTER — Ambulatory Visit (INDEPENDENT_AMBULATORY_CARE_PROVIDER_SITE_OTHER): Payer: Medicare Other | Admitting: Obstetrics and Gynecology

## 2018-02-09 VITALS — BP 115/78 | HR 73 | Ht 59.0 in | Wt 209.1 lb

## 2018-02-09 DIAGNOSIS — N812 Incomplete uterovaginal prolapse: Secondary | ICD-10-CM

## 2018-02-09 DIAGNOSIS — N3941 Urge incontinence: Secondary | ICD-10-CM

## 2018-02-09 DIAGNOSIS — Z4689 Encounter for fitting and adjustment of other specified devices: Secondary | ICD-10-CM | POA: Diagnosis not present

## 2018-02-09 NOTE — Progress Notes (Signed)
    GYNECOLOGY PROGRESS NOTE  Subjective:    Patient ID: Molly [name], female    DOB: 09-12-1951, 67 y.o.   MRN: [id number]  HPI  Patient is a 67 y.o. G77P2002 female who presents for pessary check.  She currently has a Grade 3 uterine prolapse with Grade 1 cystocele and rectocele. Notes she is feeling good today. Denies vaginal bleeding, difficulty with bowel movements.  She does note that since the pessary has been in place, she has noticed increased urgency.  Denies frequency, dysuria, hematuria, but notes that she has moments where she sometimes does not make it to the restroom in time.   The following portions of the patient's history were reviewed and updated as appropriate: allergies, current medications, past family history, past medical history, past social history, past surgical history and problem list.  Review of Systems Pertinent items noted in HPI and remainder of comprehensive ROS otherwise negative.    Objective:   Blood pressure 115/78, pulse 73, height 4\' 11"  (1.499 m), weight 209 lb 1.6 oz (94.8 kg). General appearance: alert and no distress Pelvis: ?The patient's Size 2 3/4 Gelhorn pessary was removed, cleaned and replaced without complications. Speculum examination revealed normal vaginal mucosa with no lesions or lacerations. Remainder of exam deferred.   Assessment:   Pessary check Uterine prolapse with Grade 1 cystocele/rectocele. Urinary urgency  Plan:   - Continue use of Trimo-San gel at least every 1-2 weeks.  - Urgency may be secondary to pessary in place, or overactive bladder symptoms. Denies other symptoms of UTI.  If other symptoms develop, will screen for UTI.  Discussed Kegel exercises.  Also discussed OAB meds if no success from conservative measures.  Will re-evaluate symptoms at next visit.  - RTC in 8 weeks for f/u pessary check.    Rubie Maid, MD [facility]

## 2018-02-23 ENCOUNTER — Other Ambulatory Visit: Payer: Self-pay | Admitting: Family Medicine

## 2018-02-23 DIAGNOSIS — I10 Essential (primary) hypertension: Secondary | ICD-10-CM

## 2018-02-23 DIAGNOSIS — W57XXXD Bitten or stung by nonvenomous insect and other nonvenomous arthropods, subsequent encounter: Secondary | ICD-10-CM

## 2018-02-24 NOTE — Telephone Encounter (Signed)
Hypertension medication request: Lasix to [facility].  Last office visit pertaining to hypertension: 11/10/2017  BP Readings from Last 3 Encounters:  02/09/18 115/78  01/20/18 (!) 144/85  01/19/18 119/71    Lab Results  Component Value Date   CREATININE 0.71 10/22/2017   BUN 13 10/22/2017   NA 141 10/22/2017   K 3.5 11/10/2017   CL 100 (L) 10/22/2017   CO2 30 10/22/2017     No Follow-up on file.

## 2018-02-24 NOTE — Telephone Encounter (Signed)
Refill request for general medication: Triamcinolone Cream to [facility].  Last office visit: 11/10/2017  No Follow-up on file.

## 2018-02-25 MED ORDER — FUROSEMIDE 40 MG PO TABS: 40.0000 mg | ORAL_TABLET | Freq: Every day | ORAL | 0 refills | Status: DC

## 2018-02-25 MED ORDER — TRIAMCINOLONE ACETONIDE 0.1 % EX CREA: 1.0000 "application " | TOPICAL_CREAM | Freq: Two times a day (BID) | CUTANEOUS | 0 refills | Status: DC

## 2018-04-06 ENCOUNTER — Encounter: Payer: Self-pay | Admitting: Family Medicine

## 2018-04-06 ENCOUNTER — Ambulatory Visit (INDEPENDENT_AMBULATORY_CARE_PROVIDER_SITE_OTHER): Payer: Medicare Other | Admitting: Obstetrics and Gynecology

## 2018-04-06 ENCOUNTER — Encounter: Payer: Self-pay | Admitting: Obstetrics and Gynecology

## 2018-04-06 ENCOUNTER — Ambulatory Visit: Payer: Medicare Other | Admitting: Family Medicine

## 2018-04-06 ENCOUNTER — Ambulatory Visit (INDEPENDENT_AMBULATORY_CARE_PROVIDER_SITE_OTHER): Payer: Medicare Other

## 2018-04-06 VITALS — BP 142/79 | HR 67 | Ht 59.0 in | Wt 212.9 lb

## 2018-04-06 VITALS — BP 134/74 | HR 62 | Temp 100.1°F | Resp 20 | Ht 59.0 in | Wt 212.3 lb

## 2018-04-06 VITALS — BP 118/70 | HR 58 | Temp 99.8°F | Resp 18 | Ht 59.0 in | Wt 212.0 lb

## 2018-04-06 DIAGNOSIS — E7800 Pure hypercholesterolemia, unspecified: Secondary | ICD-10-CM

## 2018-04-06 DIAGNOSIS — R05 Cough: Secondary | ICD-10-CM

## 2018-04-06 DIAGNOSIS — R058 Other specified cough: Secondary | ICD-10-CM

## 2018-04-06 DIAGNOSIS — I10 Essential (primary) hypertension: Secondary | ICD-10-CM

## 2018-04-06 DIAGNOSIS — N950 Postmenopausal bleeding: Secondary | ICD-10-CM

## 2018-04-06 DIAGNOSIS — Z23 Encounter for immunization: Secondary | ICD-10-CM

## 2018-04-06 DIAGNOSIS — I43 Cardiomyopathy in diseases classified elsewhere: Secondary | ICD-10-CM | POA: Diagnosis not present

## 2018-04-06 DIAGNOSIS — Z1211 Encounter for screening for malignant neoplasm of colon: Secondary | ICD-10-CM | POA: Diagnosis not present

## 2018-04-06 DIAGNOSIS — E785 Hyperlipidemia, unspecified: Secondary | ICD-10-CM

## 2018-04-06 DIAGNOSIS — Z0000 Encounter for general adult medical examination without abnormal findings: Secondary | ICD-10-CM

## 2018-04-06 DIAGNOSIS — R7303 Prediabetes: Secondary | ICD-10-CM

## 2018-04-06 DIAGNOSIS — Z1239 Encounter for other screening for malignant neoplasm of breast: Secondary | ICD-10-CM

## 2018-04-06 DIAGNOSIS — Z1231 Encounter for screening mammogram for malignant neoplasm of breast: Secondary | ICD-10-CM

## 2018-04-06 DIAGNOSIS — N841 Polyp of cervix uteri: Secondary | ICD-10-CM | POA: Diagnosis not present

## 2018-04-06 DIAGNOSIS — Z4689 Encounter for fitting and adjustment of other specified devices: Secondary | ICD-10-CM | POA: Diagnosis not present

## 2018-04-06 DIAGNOSIS — J4521 Mild intermittent asthma with (acute) exacerbation: Secondary | ICD-10-CM | POA: Diagnosis not present

## 2018-04-06 DIAGNOSIS — I119 Hypertensive heart disease without heart failure: Secondary | ICD-10-CM | POA: Diagnosis not present

## 2018-04-06 MED ORDER — CEFTRIAXONE SODIUM 1 G IJ SOLR
1.0000 g | Freq: Once | INTRAMUSCULAR | Status: AC
  Administered 2018-04-06: 1 g via INTRAMUSCULAR

## 2018-04-06 MED ORDER — FUROSEMIDE 40 MG PO TABS: 40.0000 mg | ORAL_TABLET | Freq: Every day | ORAL | 0 refills | Status: DC

## 2018-04-06 MED ORDER — PREDNISONE 10 MG PO TABS: 10.0000 mg | ORAL_TABLET | Freq: Two times a day (BID) | ORAL | 0 refills | Status: DC

## 2018-04-06 MED ORDER — AZITHROMYCIN 250 MG PO TABS: ORAL_TABLET | ORAL | 0 refills | Status: DC

## 2018-04-06 MED ORDER — HYDROCOD POLST-CPM POLST ER 10-8 MG/5ML PO SUER: 5.0000 mL | Freq: Two times a day (BID) | ORAL | 0 refills | Status: DC | PRN

## 2018-04-06 MED ORDER — FLUTICASONE FUROATE-VILANTEROL 100-25 MCG/INH IN AEPB
1.0000 | INHALATION_SPRAY | Freq: Every day | RESPIRATORY_TRACT | 0 refills | Status: DC
Start: 2018-04-06 — End: 2018-10-14

## 2018-04-06 NOTE — Progress Notes (Signed)
Pt stated that the pessary is helping but when she sits down sometimes she notices bleeding.

## 2018-04-06 NOTE — Progress Notes (Signed)
Name: Molly [name]   MRN: [id number]    DOB: January 31, 1951   Date:04/06/2018       Progress Note  Subjective  Chief Complaint  Chief Complaint  Patient presents with  . Cough    HPI  Cough: she states that one week ago she has sneezing, and post-nasal drainage that was clear, feeling tired and decrease in appetite, however yesterday she spiked a fever 102.2 at times productive, other times dry, but very deep. She was having SOB last night but slightly better now. Taking otc medication without much help.   Asthma: mild intermittent with a flare now. She has noticed sob and wheezing since last night, she had a breathing treatment without much help.   CHF: out of lasix since Saturday, edema is not bad, but would like a refill, no chest pain , except from soreness - coughing too much  Morbid obesity: off victoza because of episode of pancreatitis, also has prediabetes, check labs and consider metformin   Patient Active Problem List   Diagnosis Date Noted  . Umbilical hernia without obstruction and without gangrene   . Atherosclerosis of right coronary artery 11/10/2017  . Atherosclerosis of abdominal aorta ([place]) 11/10/2017  . Prediabetes 10/02/2017  . Pain in limb 03/31/2017  . Perennial allergic rhinitis with seasonal variation 05/14/2016  . Asthma, mild intermittent, well-controlled 05/14/2016  . Hypertension goal BP (blood pressure) < 140/90 12/10/2015  . Cardiomyopathy due to hypertension ([place]) 12/10/2015  . History of CVA (cerebrovascular accident) 12/10/2015  . Hyperlipidemia LDL goal <70 12/10/2015  . Statin intolerance 12/10/2015    Past Surgical History:  Procedure Laterality Date  . CHOLECYSTECTOMY    . COLONOSCOPY WITH PROPOFOL N/A 01/08/2017   Procedure: COLONOSCOPY WITH PROPOFOL;  Surgeon: [name], MD;  Location: [facility] ENDOSCOPY;  Service: Endoscopy;  Laterality: N/A;  . UMBILICAL HERNIA REPAIR N/A 11/17/2017   Procedure: HERNIA REPAIR UMBILICAL ADULT;  Surgeon:  Jules Husbands, MD;  Location: [facility] ORS;  Service: General;  Laterality: N/A;    Family History  Problem Relation Age of Onset  . Stroke Mother   . Hypertension Mother   . Dementia Mother   . Alzheimer's disease Mother   . Gout Father   . Asthma Father   . Hypertension Father   . Dementia Father   . Healthy Sister   . Stroke Brother   . Alzheimer's disease Brother   . Healthy Daughter   . Hypertension Brother   . Healthy Brother   . Cancer Paternal Grandmother   . Healthy Sister   . Healthy Sister   . Hypertension Sister   . Hypertension Sister   . Stroke Brother   . Alzheimer's disease Brother   . Stroke Brother   . Hypertension Brother   . Stroke Brother   . Hypertension Brother   . Healthy Brother   . Healthy Brother   . Hypertension Daughter   . Breast cancer Neg Hx     Social History   Socioeconomic History  . Marital status: Divorced    Spouse name: Not on file  . Number of children: 2  . Years of education: some college  . Highest education level: 12th grade  Occupational History  . Occupation: Retired  Scientific laboratory technician  . Financial resource strain: Not hard at all  . Food insecurity:    Worry: Never true    Inability: Never true  . Transportation needs:    Medical: No    Non-medical: No  Tobacco  Use  . Smoking status: Never Smoker  . Smokeless tobacco: Never Used  . Tobacco comment: smoking cessation materials not required  Substance and Sexual Activity  . Alcohol use: No    Alcohol/week: 0.0 oz  . Drug use: No  . Sexual activity: Never  Lifestyle  . Physical activity:    Days per week: 0 days    Minutes per session: 0 min  . Stress: Not at all  Relationships  . Social connections:    Talks on phone: Patient refused    Gets together: Patient refused    Attends religious service: Patient refused    Active member of club or organization: Patient refused    Attends meetings of clubs or organizations: Patient refused    Relationship status:  Divorced  . Intimate partner violence:    Fear of current or ex partner: No    Emotionally abused: No    Physically abused: No    Forced sexual activity: No  Other Topics Concern  . Not on file  Social History Narrative  . Not on file     Current Outpatient Medications:  .  albuterol (PROVENTIL HFA;VENTOLIN HFA) 108 (90 Base) MCG/ACT inhaler, Inhale 2 puffs into the lungs every 4 (four) hours as needed for wheezing or shortness of breath., Disp: 1 Inhaler, Rfl: 0 .  calcium carbonate 1250 MG capsule, Take 1,250 mg daily by mouth., Disp: , Rfl:  .  cetirizine (ZYRTEC) 10 MG tablet, Take 1 tablet (10 mg total) by mouth daily as needed., Disp: 90 tablet, Rfl: 1 .  diclofenac sodium (VOLTAREN) 1 % GEL, Apply 2 g 3 (three) times daily as needed topically (pain). , Disp: , Rfl:  .  ezetimibe (ZETIA) 10 MG tablet, Take 1 tablet (10 mg total) daily by mouth. (Patient taking differently: Take 10 mg daily by mouth. ), Disp: 90 tablet, Rfl: 1 .  furosemide (LASIX) 40 MG tablet, Take 1 tablet (40 mg total) by mouth daily., Disp: 90 tablet, Rfl: 0 .  Multiple Vitamin (MULTIVITAMIN WITH MINERALS) TABS tablet, Take 1 tablet 4 (four) times a week by mouth., Disp: , Rfl:  .  olmesartan-hydrochlorothiazide (BENICAR HCT) 40-12.5 MG tablet, Take 1 tablet daily by mouth., Disp: 90 tablet, Rfl: 1 .  omeprazole (PRILOSEC) 20 MG capsule, Take 20 mg daily by mouth., Disp: , Rfl:  .  Triamcinolone Acetonide (NASACORT ALLERGY 24HR NA), Place 1 spray daily as needed into the nose (allergies)., Disp: , Rfl:  .  triamcinolone cream (KENALOG) 0.1 %, Apply 1 application topically 2 (two) times daily., Disp: 30 g, Rfl: 0  Allergies  Allergen Reactions  . Nsaids Hives and Rash  . Statins Other (See Comments)    Joint pains  . Victoza [Liraglutide]     pancreatitis  . Crestor [Rosuvastatin Calcium] Rash     ROS  Ten systems reviewed and is negative except as mentioned in HPI   Objective  Vitals:   04/06/18  1614  BP: 118/70  Pulse: (!) 58  Resp: 18  Temp: 99.8 F (37.7 C)  TempSrc: Oral  SpO2: 96%  Weight: 212 lb (96.2 kg)  Height: 4\' 11"  (1.499 m)    Body mass index is 42.82 kg/m.  Physical Exam  Constitutional: Patient appears well-developed and well-nourished. Obese  No distress.  HEENT: head atraumatic, normocephalic, pupils equal and reactive to light, ears normal TM ,neck supple, throat within normal limits Cardiovascular: Normal rate, regular rhythm and normal heart sounds.  No murmur heard. Trace  BLE edema. Pulmonary/Chest: Effort normal , diffuse rhonchi bilaterally, coughing frequently in our office.  Abdominal: Soft.  There is no tenderness. Psychiatric: Patient has a normal mood and affect. behavior is normal. Judgment and thought content normal.    PHQ2/9: Depression screen [facility] 2/9 04/06/2018 11/10/2017 10/02/2017 11/11/2016 05/14/2016  Decreased Interest 0 0 0 0 0  Down, Depressed, Hopeless 0 0 0 0 0  PHQ - 2 Score 0 0 0 0 0  Altered sleeping 0 - - - -  Tired, decreased energy 0 - - - -  Change in appetite 0 - - - -  Feeling bad or failure about yourself  0 - - - -  Trouble concentrating 0 - - - -  Moving slowly or fidgety/restless 0 - - - -  Suicidal thoughts 0 - - - -  PHQ-9 Score 0 - - - -  Difficult doing work/chores Not difficult at all - - - -    Fall Risk: Fall Risk  04/06/2018 11/10/2017 10/02/2017 11/11/2016 05/14/2016  Falls in the past year? No No No No No  Risk for fall due to : Impaired vision;Medication side effect - - - -  Risk for fall due to: Comment blurred vision, wears eyeglasses - - - -      Assessment & Plan  1. Productive cough  Possible bronchitis, versus pneumonia we will treat as pneumonia for now  - predniSONE (DELTASONE) 10 MG tablet; Take 1 tablet (10 mg total) by mouth 2 (two) times daily with a meal.  Dispense: 10 tablet; Refill: 0 - azithromycin (ZITHROMAX) 250 MG tablet; Take two first day and one daily after that   Dispense: 6 tablet; Refill: 0 - COMPLETE METABOLIC PANEL WITH GFR - CBC with Differential/Platelet - chlorpheniramine-HYDROcodone (TUSSIONEX PENNKINETIC ER) 10-8 MG/5ML SUER; Take 5 mLs by mouth every 12 (twelve) hours as needed.  Dispense: 115 mL; Refill: 0 - cefTRIAXone (ROCEPHIN) injection 1 g - DG Chest 2 View; Future  2. Mild intermittent asthma with acute exacerbation  - predniSONE (DELTASONE) 10 MG tablet; Take 1 tablet (10 mg total) by mouth 2 (two) times daily with a meal.  Dispense: 10 tablet; Refill: 0 - fluticasone furoate-vilanterol (BREO ELLIPTA) 100-25 MCG/INH AEPB; Inhale 1 puff into the lungs daily.  Dispense: 60 each; Refill: 0  3. Hyperlipidemia LDL goal <70  - Lipid panel  4. Morbid obesity ([place])  Off medication because of episode of pancreatitis   5. Prediabetes  - Hemoglobin A1c  6. Cardiomyopathy due to hypertension, without heart failure (HCC)  - furosemide (LASIX) 40 MG tablet; Take 1 tablet (40 mg total) by mouth daily.  Dispense: 30 tablet; Refill: 0  7. Hypertension goal BP (blood pressure) < 140/90  - furosemide (LASIX) 40 MG tablet; Take 1 tablet (40 mg total) by mouth daily.  Dispense: 30 tablet; Refill: 0

## 2018-04-06 NOTE — Patient Instructions (Signed)
[name] , Thank you for taking time to come for your Medicare Wellness Visit. I appreciate your ongoing commitment to your health goals. Please review the following plan we discussed and let me know if I can assist you in the future.   Screening recommendations/referrals: Colorectal Screening: Completed 01/08/17. Repeat every year. You will receive a call from our office regarding your appointment. Mammogram: Completed 02/02/17. Repeat every year. Ordered today. Please call to schedule your appointment.  Bone Density: Completed 02/02/17. Osteoporotic screenings no longer required. Lung Cancer Screening: You do not qualify for this screening Hepatitis C Screening: Completed 01/23/17  Vision and Dental Exams: Recommended annual ophthalmology exams for early detection of glaucoma and other disorders of the eye Recommended annual dental exams for proper oral hygiene  Vaccinations: Influenza vaccine: Up to date Pneumococcal vaccine: Completed series Tdap vaccine: Up to date Shingles vaccine: Please call your insurance company to determine your out of pocket expense for the Shingrix vaccine. You may also receive this vaccine at your local pharmacy or Health Dept.   Advanced directives: Advance directive discussed with you today. I have provided a copy for you to complete at home and have notarized. Once this is complete please bring a copy in to our office so we can scan it into your chart.  Conditions/risks identified: Recommend to drink at least 6-8 8oz glasses of water per day.  Next appointment: Please schedule your Annual Wellness Visit with your Nurse Health Advisor in one year.  Preventive Care 24 Years and Older, Female Preventive care refers to lifestyle choices and visits with your health care provider that can promote health and wellness. What does preventive care include?  A yearly physical exam. This is also called an annual well check.  Dental exams once or twice a  year.  Routine eye exams. Ask your health care provider how often you should have your eyes checked.  Personal lifestyle choices, including:  Daily care of your teeth and gums.  Regular physical activity.  Eating a healthy diet.  Avoiding tobacco and drug use.  Limiting alcohol use.  Practicing safe sex.  Taking low-dose aspirin every day.  Taking vitamin and mineral supplements as recommended by your health care provider. What happens during an annual well check? The services and screenings done by your health care provider during your annual well check will depend on your age, overall health, lifestyle risk factors, and family history of disease. Counseling  Your health care provider may ask you questions about your:  Alcohol use.  Tobacco use.  Drug use.  Emotional well-being.  Home and relationship well-being.  Sexual activity.  Eating habits.  History of falls.  Memory and ability to understand (cognition).  Work and work Statistician.  Reproductive health. Screening  You may have the following tests or measurements:  Height, weight, and BMI.  Blood pressure.  Lipid and cholesterol levels. These may be checked every 5 years, or more frequently if you are over 90 years old.  Skin check.  Lung cancer screening. You may have this screening every year starting at age 42 if you have a 30-pack-year history of smoking and currently smoke or have quit within the past 15 years.  Fecal occult blood test (FOBT) of the stool. You may have this test every year starting at age 43.  Flexible sigmoidoscopy or colonoscopy. You may have a sigmoidoscopy every 5 years or a colonoscopy every 10 years starting at age 34.  Hepatitis C blood test.  Hepatitis B  blood test.  Sexually transmitted disease (STD) testing.  Diabetes screening. This is done by checking your blood sugar (glucose) after you have not eaten for a while (fasting). You may have this done every 1-3  years.  Bone density scan. This is done to screen for osteoporosis. You may have this done starting at age 47.  Mammogram. This may be done every 1-2 years. Talk to your health care provider about how often you should have regular mammograms. Talk with your health care provider about your test results, treatment options, and if necessary, the need for more tests. Vaccines  Your health care provider may recommend certain vaccines, such as:  Influenza vaccine. This is recommended every year.  Tetanus, diphtheria, and acellular pertussis (Tdap, Td) vaccine. You may need a Td booster every 10 years.  Zoster vaccine. You may need this after age 10.  Pneumococcal 13-valent conjugate (PCV13) vaccine. One dose is recommended after age 35.  Pneumococcal polysaccharide (PPSV23) vaccine. One dose is recommended after age 75. Talk to your health care provider about which screenings and vaccines you need and how often you need them. This information is not intended to replace advice given to you by your health care provider. Make sure you discuss any questions you have with your health care provider. Document Released: 01/11/2016 Document Revised: 09/03/2016 Document Reviewed: 10/16/2015 Elsevier Interactive Patient Education  2017 [place] Prevention in the Home Falls can cause injuries. They can happen to people of all ages. There are many things you can do to make your home safe and to help prevent falls. What can I do on the outside of my home?  Regularly fix the edges of walkways and driveways and fix any cracks.  Remove anything that might make you trip as you walk through a door, such as a raised step or threshold.  Trim any bushes or trees on the path to your home.  Use bright outdoor lighting.  Clear any walking paths of anything that might make someone trip, such as rocks or tools.  Regularly check to see if handrails are loose or broken. Make sure that both sides of any  steps have handrails.  Any raised decks and porches should have guardrails on the edges.  Have any leaves, snow, or ice cleared regularly.  Use sand or salt on walking paths during winter.  Clean up any spills in your garage right away. This includes oil or grease spills. What can I do in the bathroom?  Use night lights.  Install grab bars by the toilet and in the tub and shower. Do not use towel bars as grab bars.  Use non-skid mats or decals in the tub or shower.  If you need to sit down in the shower, use a plastic, non-slip stool.  Keep the floor dry. Clean up any water that spills on the floor as soon as it happens.  Remove soap buildup in the tub or shower regularly.  Attach bath mats securely with double-sided non-slip rug tape.  Do not have throw rugs and other things on the floor that can make you trip. What can I do in the bedroom?  Use night lights.  Make sure that you have a light by your bed that is easy to reach.  Do not use any sheets or blankets that are too big for your bed. They should not hang down onto the floor.  Have a firm chair that has side arms. You can use this for support  while you get dressed.  Do not have throw rugs and other things on the floor that can make you trip. What can I do in the kitchen?  Clean up any spills right away.  Avoid walking on wet floors.  Keep items that you use a lot in easy-to-reach places.  If you need to reach something above you, use a strong step stool that has a grab bar.  Keep electrical cords out of the way.  Do not use floor polish or wax that makes floors slippery. If you must use wax, use non-skid floor wax.  Do not have throw rugs and other things on the floor that can make you trip. What can I do with my stairs?  Do not leave any items on the stairs.  Make sure that there are handrails on both sides of the stairs and use them. Fix handrails that are broken or loose. Make sure that handrails are  as long as the stairways.  Check any carpeting to make sure that it is firmly attached to the stairs. Fix any carpet that is loose or worn.  Avoid having throw rugs at the top or bottom of the stairs. If you do have throw rugs, attach them to the floor with carpet tape.  Make sure that you have a light switch at the top of the stairs and the bottom of the stairs. If you do not have them, ask someone to add them for you. What else can I do to help prevent falls?  Wear shoes that:  Do not have high heels.  Have rubber bottoms.  Are comfortable and fit you well.  Are closed at the toe. Do not wear sandals.  If you use a stepladder:  Make sure that it is fully opened. Do not climb a closed stepladder.  Make sure that both sides of the stepladder are locked into place.  Ask someone to hold it for you, if possible.  Clearly mark and make sure that you can see:  Any grab bars or handrails.  First and last steps.  Where the edge of each step is.  Use tools that help you move around (mobility aids) if they are needed. These include:  Canes.  Walkers.  Scooters.  Crutches.  Turn on the lights when you go into a dark area. Replace any light bulbs as soon as they burn out.  Set up your furniture so you have a clear path. Avoid moving your furniture around.  If any of your floors are uneven, fix them.  If there are any pets around you, be aware of where they are.  Review your medicines with your doctor. Some medicines can make you feel dizzy. This can increase your chance of falling. Ask your doctor what other things that you can do to help prevent falls. This information is not intended to replace advice given to you by your health care provider. Make sure you discuss any questions you have with your health care provider. Document Released: 10/11/2009 Document Revised: 05/22/2016 Document Reviewed: 01/19/2015 Elsevier Interactive Patient Education  2017 [employer].

## 2018-04-06 NOTE — Progress Notes (Addendum)
Subjective:   Molly [name] is a 67 y.o. female who presents for an Initial Medicare Annual Wellness Visit.   During visit, pt noted to have a productive cough, wheezing and fever. Pt states she has had nasal drainage, slight non-productive cough and sneezing for about 7 days. Had been taking Zyrtec, OTC TussinDM and albuterol inhaler to alleviate symptoms. However, pt states last night she developed a fever, dyspnea, chest tightness on inspiration, fatigue and a productive cough. Discussed findings with Dr. Ancil Boozer. Pt to be seen today for further evaluation.   Pt also due for PCV13. Given symptoms, I held this vaccine until further discussion with Dr. Ancil Boozer. Verbal order received by Dr. Ancil Boozer to administer PCV13 today.  Review of Systems    N/A  Cardiac Risk Factors include: advanced age (>36men, >35 women);dyslipidemia;hypertension;obesity (BMI >30kg/m2);sedentary lifestyle     Objective:    Today's Vitals   04/06/18 1451  BP: 134/74  Pulse: 62  Resp: 20  Temp: 100.1 F (37.8 C)  TempSrc: Oral  SpO2: 90%  Weight: 212 lb 4.8 oz (96.3 kg)  Height: 4\' 11"  (1.499 m)   Body mass index is 42.88 kg/m.   Advanced Directives 04/06/2018 11/17/2017 11/10/2017 10/05/2017 10/02/2017 03/31/2017 01/29/2017  Does Patient Have a Medical Advance Directive? No No No No No No No  Would patient like information on creating a medical advance directive? Yes (MAU/Ambulatory/Procedural Areas - Information given) No - Patient declined No - Patient declined - - - No - Patient declined    Current Medications (verified) Outpatient Encounter Medications as of 04/06/2018  Medication Sig  . albuterol (PROVENTIL HFA;VENTOLIN HFA) 108 (90 Base) MCG/ACT inhaler Inhale 2 puffs into the lungs every 4 (four) hours as needed for wheezing or shortness of breath.  . calcium carbonate 1250 MG capsule Take 1,250 mg daily by mouth.  . cetirizine (ZYRTEC) 10 MG tablet Take 1 tablet (10 mg total) by mouth daily as needed.    . diclofenac sodium (VOLTAREN) 1 % GEL Apply 2 g 3 (three) times daily as needed topically (pain).   Marland Kitchen ezetimibe (ZETIA) 10 MG tablet Take 1 tablet (10 mg total) daily by mouth. (Patient taking differently: Take 10 mg daily by mouth. )  . furosemide (LASIX) 40 MG tablet Take 1 tablet (40 mg total) by mouth daily.  . Multiple Vitamin (MULTIVITAMIN WITH MINERALS) TABS tablet Take 1 tablet 4 (four) times a week by mouth.  . olmesartan-hydrochlorothiazide (BENICAR HCT) 40-12.5 MG tablet Take 1 tablet daily by mouth.  Marland Kitchen omeprazole (PRILOSEC) 20 MG capsule Take 20 mg daily by mouth.  . Triamcinolone Acetonide (NASACORT ALLERGY 24HR NA) Place 1 spray daily as needed into the nose (allergies).  . triamcinolone cream (KENALOG) 0.1 % Apply 1 application topically 2 (two) times daily.  . [DISCONTINUED] traMADol (ULTRAM) 50 MG tablet Take 1 tablet (50 mg total) by mouth every 6 (six) hours as needed. (Patient not taking: Reported on 04/06/2018)   No facility-administered encounter medications on file as of 04/06/2018.     Allergies (verified) Nsaids; Statins; Victoza [liraglutide]; and Crestor [rosuvastatin calcium]   Hospitalizations/ED visits and surgeries occurring within the previous 12 months:  Within the previous 12 months, pt underwent the following surgical procedure:  Umbilical hernia repair @ [place] by Dr. Dahlia Byes on 11/17/17  In addition to the above mentioned surgical procedure, pt was seen @ [facility] ED on 09/30/71 for periumbilical hernia and treated by Dr. Corky Downs.  History: Past Medical History:  Diagnosis Date  .  Asthma    history of asthma  . GERD (gastroesophageal reflux disease)    takes prilosec prn  . History of CVA (cerebrovascular accident) 12/10/2015  . Hyperlipidemia LDL goal <70 12/10/2015  . Hypertension   . Prediabetes 10/02/2017   A1c 6 in January 2018  . Stroke [facility] 1990    no residual effects   Past Surgical History:  Procedure Laterality Date  . CHOLECYSTECTOMY    .  COLONOSCOPY WITH PROPOFOL N/A 01/08/2017   Procedure: COLONOSCOPY WITH PROPOFOL;  Surgeon: [name], MD;  Location: [facility] ENDOSCOPY;  Service: Endoscopy;  Laterality: N/A;  . UMBILICAL HERNIA REPAIR N/A 11/17/2017   Procedure: HERNIA REPAIR UMBILICAL ADULT;  Surgeon: Jules Husbands, MD;  Location: [facility] ORS;  Service: General;  Laterality: N/A;   Family History  Problem Relation Age of Onset  . Stroke Mother   . Hypertension Mother   . Dementia Mother   . Alzheimer's disease Mother   . Gout Father   . Asthma Father   . Hypertension Father   . Dementia Father   . Healthy Sister   . Stroke Brother   . Alzheimer's disease Brother   . Healthy Daughter   . Hypertension Brother   . Healthy Brother   . Cancer Paternal Grandmother   . Healthy Sister   . Healthy Sister   . Hypertension Sister   . Hypertension Sister   . Stroke Brother   . Alzheimer's disease Brother   . Stroke Brother   . Hypertension Brother   . Stroke Brother   . Hypertension Brother   . Healthy Brother   . Healthy Brother   . Hypertension Daughter   . Breast cancer Neg Hx    Social History   Socioeconomic History  . Marital status: Divorced    Spouse name: Not on file  . Number of children: 2  . Years of education: some college  . Highest education level: 12th grade  Occupational History  . Occupation: Retired  Scientific laboratory technician  . Financial resource strain: Not hard at all  . Food insecurity:    Worry: Never true    Inability: Never true  . Transportation needs:    Medical: No    Non-medical: No  Tobacco Use  . Smoking status: Never Smoker  . Smokeless tobacco: Never Used  . Tobacco comment: smoking cessation materials not required  Substance and Sexual Activity  . Alcohol use: No    Alcohol/week: 0.0 oz  . Drug use: No  . Sexual activity: Never  Lifestyle  . Physical activity:    Days per week: 0 days    Minutes per session: 0 min  . Stress: Not at all  Relationships  . Social connections:      Talks on phone: Patient refused    Gets together: Patient refused    Attends religious service: Patient refused    Active member of club or organization: Patient refused    Attends meetings of clubs or organizations: Patient refused    Relationship status: Divorced  Other Topics Concern  . Not on file  Social History Narrative  . Not on file    Tobacco Counseling Counseling given: No Comment: smoking cessation materials not required   Clinical Intake:  Pre-visit preparation completed: Yes  Pain : No/denies pain   BMI - recorded: 42.88 Nutritional Status: BMI > 30  Obese Nutritional Risks: None Diabetes: No  How often do you need to have someone help you when you read instructions,  pamphlets, or other written materials from your doctor or pharmacy?: 1 - Never  Interpreter Needed?: No  Information entered by :: AEversole, LPN   Activities of Daily Living In your present state of health, do you have any difficulty performing the following activities: 04/06/2018 11/10/2017  Hearing? N N  Comment denies hearing aids -  Vision? Y N  Comment blurred vision, wears eyeglasses -  Difficulty concentrating or making decisions? N N  Walking or climbing stairs? N N  Dressing or bathing? N N  Doing errands, shopping? Y N  Comment family transports but pt can drive if needed -  Preparing Food and eating ? N -  Comment denies dentures -  Using the Toilet? N -  In the past six months, have you accidently leaked urine? N -  Do you have problems with loss of bowel control? N -  Managing your Medications? N -  Managing your Finances? N -  Housekeeping or managing your Housekeeping? N -  Some recent data might be hidden     Immunizations and Health Maintenance Immunization History  Administered Date(s) Administered  . Influenza, High Dose Seasonal PF 11/11/2016, 10/02/2017  . Pneumococcal Conjugate-13 04/06/2018  . Pneumococcal Polysaccharide-23 11/11/2016  . Tdap  11/11/2016   Health Maintenance Due  Topic Date Due  . COLONOSCOPY  01/08/2018  . MAMMOGRAM  02/02/2018    Patient Care Team: Steele Sizer, MD as PCP - General (Family Medicine) Rubie Maid, MD as Consulting Physician (Obstetrics and Gynecology)  Indicate any recent Medical Services you may have received from other than [facility] providers in the past year (date may be approximate).     Assessment:   This is a routine wellness examination for Amarii.  Hearing/Vision screen Vision Screening Comments: Sees Dr. Gloriann Loan for annual eye exams  Dietary issues and exercise activities discussed: Current Exercise Habits: The patient does not participate in regular exercise at present, Exercise limited by: respiratory conditions(s)(dyspnea)  Goals    . DIET - INCREASE WATER INTAKE     Recommend to drink at least 6-8 8oz glasses of water per day.      Depression Screen PHQ 2/9 Scores 04/06/2018 11/10/2017 10/02/2017 11/11/2016 05/14/2016 02/13/2016 12/10/2015  PHQ - 2 Score 0 0 0 0 0 0 0  PHQ- 9 Score 0 - - - - - -    Fall Risk Fall Risk  04/06/2018 11/10/2017 10/02/2017 11/11/2016 05/14/2016  Falls in the past year? No No No No No  Risk for fall due to : Impaired vision;Medication side effect - - - -  Risk for fall due to: Comment blurred vision, wears eyeglasses - - - -    Is the home free of loose throw rugs in walkways, pet beds, electrical cords, etc? Yes Adequate lighting to reduce risk of falls?  Yes In addition, does the patient have any of the following: Stairs in or around the home WITH handrails? No Grab bars in the bathroom? Yes  Shower chair or a place to sit while bathing? Yes Use of an elevated toilet seat or a handicapped toilet? Yes Use of a cane, walker or w/c? No  Timed Get Up and Go Performed: Yes. Pt ambulated 10 feet within 24 sec. Gait slow, steady and without the use of an assistive device. No intervention required at this time. Fall risk prevention has been  discussed.  Community Resource Referral not required at this time.  Cognitive Function:     6CIT Screen 04/06/2018  What Year? 0  points  What month? 0 points  What time? 3 points  Count back from 20 0 points  Months in reverse 0 points  Repeat phrase 0 points  Total Score 3    Screening Tests Health Maintenance  Topic Date Due  . COLONOSCOPY  01/08/2018  . MAMMOGRAM  02/02/2018  . INFLUENZA VACCINE  07/29/2018  . TETANUS/TDAP  11/11/2026  . DEXA SCAN  Completed  . Hepatitis C Screening  Completed  . PNA vac Low Risk Adult  Completed    Qualifies for Shingles Vaccine? Yes. Due for Zostavax or Shingrix vaccine. Education has been provided regarding the importance of this vaccine. Pt has been advised to call her insurance company to determine her out of pocket expense. Advised she may also receive this vaccine at her local pharmacy or Health Dept. Verbalized acceptance and understanding.  Cancer Screenings: Lung: Low Dose CT Chest recommended if Age 49-80 years, 30 pack-year currently smoking OR have quit w/in 15years. Patient does not qualify. Breast: Up to date on Mammogram? Yes. Completed 02/02/17. Repeat every year. Ordered today. Pt has been provided with contact information and advised to schedule appt for completion.   Up to date of Bone Density/Dexa? Yes. Completed 02/02/17. Osteoporotic screenings no longer required. Colorectal: Completed 01/08/17. Repeat every year. Referred to Dr. Vicente Males for completion of repeat colonoscopy. Pt aware she will receive a call from our office re: her appt. Message sent to referral coordinator for scheduling purposes.  Additional Screenings: Hepatitis C Screening: Completed 01/23/17   Plan:  I have personally reviewed and addressed the Medicare Annual Wellness questionnaire and have noted the following in the patient's chart:  A. Medical and social history B. Use of alcohol, tobacco or illicit drugs  C. Current medications and  supplements D. Functional ability and status E.  Nutritional status F.  Physical activity G. Advance directives H. List of other physicians I.  Hospitalizations, surgeries, and ER visits in previous 12 months J.  P[place] such as hearing and vision if needed, cognitive and depression L. Referrals and appointments  In addition, I have reviewed and discussed with patient certain preventive protocols, quality metrics, and best practice recommendations. A written personalized care plan for preventive services as well as general preventive health recommendations were provided to patient.  See attached scanned questionnaire for additional information.   Signed,  Aleatha Borer, LPN Nurse Health Advisor  I have reviewed this encounter including the documentation in this note and/or discussed this patient with the provider, Aleatha Borer, LPN. I am certifying that I agree with the content of this note as supervising physician.  Steele Sizer, MD [facility] 04/06/2018, 5:12 PM

## 2018-04-06 NOTE — Progress Notes (Addendum)
    GYNECOLOGY PROGRESS NOTE  Subjective:    Patient ID: Molly [name], female    DOB: 02/11/1951, 67 y.o.   MRN: [id number]  HPI  Patient is a 67 y.o. G61P2002 female who presents for pessary check.  She currently has a Grade 3 uterine prolapse with Grade 1 cystocele and rectocele. She has noted some episodes vaginal bleeding, especially when sitting on hard surfaces or chairs. Denies difficulty moving her bowels or urinating. Denies pain.   The following portions of the patient's history were reviewed and updated as appropriate: allergies, current medications, past family history, past medical history, past social history, past surgical history and problem list.  Review of Systems Pertinent items noted in HPI and remainder of comprehensive ROS otherwise negative.    Objective:   Blood pressure (!) 142/79, pulse 67, height 4\' 11"  (1.499 m), weight 212 lb 14.4 oz (96.6 kg). General appearance: alert and no distress Pelvis: ?The patient's Size 2 3/4 Gelhorn pessary was removed, cleaned, but due to difficulty with replacement, I decided to place her previous pessary (a 2 3/4 Shaatz). Speculum examination revealed normal vaginal mucosa with no lesions or lacerations.  Cervix with what appears to an endocervical polyp Remainder of exam deferred.   Assessment:   Pessary check Uterine prolapse with Grade 1 cystocele/rectocele. Cervical polyp  Plan:   - Continue use of Trimo-San gel at least every  week.  - Possible endocervical polyp removed and sent to pathology.  Hemostasis achieved with silver nitrate sticks and Monsel's solution.  - Pessary changed back to older one due to difficulty inserting current one.  - RTC in 8 weeks for f/u pessary check   Rubie Maid, MD [facility]

## 2018-04-07 ENCOUNTER — Ambulatory Visit
Admission: RE | Admit: 2018-04-07 | Discharge: 2018-04-07 | Disposition: A | Discharge status: Home / Self Care | Payer: Medicare Other | Source: Ambulatory Visit | Attending: Family Medicine | Admitting: Family Medicine

## 2018-04-07 ENCOUNTER — Encounter: Payer: Self-pay | Admitting: Obstetrics and Gynecology

## 2018-04-07 DIAGNOSIS — R05 Cough: Secondary | ICD-10-CM | POA: Insufficient documentation

## 2018-04-07 DIAGNOSIS — R058 Other specified cough: Secondary | ICD-10-CM

## 2018-04-08 ENCOUNTER — Encounter: Payer: Self-pay | Admitting: Obstetrics and Gynecology

## 2018-04-08 LAB — PATHOLOGY

## 2018-04-09 ENCOUNTER — Other Ambulatory Visit: Payer: Self-pay

## 2018-04-12 ENCOUNTER — Telehealth: Payer: Self-pay

## 2018-04-12 ENCOUNTER — Other Ambulatory Visit: Payer: Self-pay

## 2018-04-12 DIAGNOSIS — Z86010 Personal history of colonic polyps: Secondary | ICD-10-CM

## 2018-04-12 MED ORDER — NA SULFATE-K SULFATE-MG SULF 17.5-3.13-1.6 GM/177ML PO SOLN: 1.0000 | ORAL | 0 refills | Status: DC

## 2018-04-12 NOTE — Telephone Encounter (Signed)
Gastroenterology Pre-Procedure Review  Request Date:  Requesting Physician: Dr.   PATIENT REVIEW QUESTIONS: The patient responded to the following health history questions as indicated:    1. Are you having any GI issues? no 2. Do you have a personal history of Polyps? yes (Hx of polyps ) 3. Do you have a family history of Colon Cancer or Polyps? no 4. Diabetes Mellitus? no 5. Joint replacements in the past 12 months?no 6. Major health problems in the past 3 months?no 7. Any artificial heart valves, MVP, or defibrillator?no    MEDICATIONS & ALLERGIES:    Patient reports the following regarding taking any anticoagulation/antiplatelet therapy:   Plavix, Coumadin, Eliquis, Xarelto, Lovenox, Pradaxa, Brilinta, or Effient? no Aspirin? no  Patient confirms/reports the following medications:  Current Outpatient Medications  Medication Sig Dispense Refill  . albuterol (PROVENTIL HFA;VENTOLIN HFA) 108 (90 Base) MCG/ACT inhaler Inhale 2 puffs into the lungs every 4 (four) hours as needed for wheezing or shortness of breath. 1 Inhaler 0  . azithromycin (ZITHROMAX) 250 MG tablet Take two first day and one daily after that 6 tablet 0  . calcium carbonate 1250 MG capsule Take 1,250 mg daily by mouth.    . cetirizine (ZYRTEC) 10 MG tablet Take 1 tablet (10 mg total) by mouth daily as needed. 90 tablet 1  . chlorpheniramine-HYDROcodone (TUSSIONEX PENNKINETIC ER) 10-8 MG/5ML SUER Take 5 mLs by mouth every 12 (twelve) hours as needed. 115 mL 0  . diclofenac sodium (VOLTAREN) 1 % GEL Apply 2 g 3 (three) times daily as needed topically (pain).     Marland Kitchen ezetimibe (ZETIA) 10 MG tablet Take 1 tablet (10 mg total) daily by mouth. (Patient taking differently: Take 10 mg daily by mouth. ) 90 tablet 1  . fluticasone furoate-vilanterol (BREO ELLIPTA) 100-25 MCG/INH AEPB Inhale 1 puff into the lungs daily. 60 each 0  . furosemide (LASIX) 40 MG tablet Take 1 tablet (40 mg total) by mouth daily. 30 tablet 0  .  Multiple Vitamin (MULTIVITAMIN WITH MINERALS) TABS tablet Take 1 tablet 4 (four) times a week by mouth.    . olmesartan-hydrochlorothiazide (BENICAR HCT) 40-12.5 MG tablet Take 1 tablet daily by mouth. 90 tablet 1  . omeprazole (PRILOSEC) 20 MG capsule Take 20 mg daily by mouth.    . predniSONE (DELTASONE) 10 MG tablet Take 1 tablet (10 mg total) by mouth 2 (two) times daily with a meal. 10 tablet 0  . Triamcinolone Acetonide (NASACORT ALLERGY 24HR NA) Place 1 spray daily as needed into the nose (allergies).    . triamcinolone cream (KENALOG) 0.1 % Apply 1 application topically 2 (two) times daily. 30 g 0   No current facility-administered medications for this visit.     Patient confirms/reports the following allergies:  Allergies  Allergen Reactions  . Nsaids Hives and Rash  . Statins Other (See Comments)    Joint pains  . Victoza [Liraglutide]     pancreatitis  . Crestor [Rosuvastatin Calcium] Rash    No orders of the defined types were placed in this encounter.   AUTHORIZATION INFORMATION Primary Insurance: 1D#: Group #:  Secondary Insurance: 1D#: Group #:  SCHEDULE INFORMATION: Date: 04/28/18 Time: Location: [place]

## 2018-04-14 ENCOUNTER — Telehealth: Payer: Self-pay | Admitting: Family Medicine

## 2018-04-14 NOTE — Telephone Encounter (Signed)
Pt is leaving tonight going out of town. States that the benicar prescription is on back order and she only have 2 pills left. Is it possible to please send to [facility]

## 2018-04-14 NOTE — Telephone Encounter (Signed)
Please dis-reguard message. Pt called [facility] and they do have the medication is stock and they will call to get the prescription transferred from [place] to them.

## 2018-04-27 ENCOUNTER — Telehealth: Payer: Self-pay | Admitting: Gastroenterology

## 2018-04-27 NOTE — Telephone Encounter (Signed)
Pt left vm, to reschedule her colonoscopy

## 2018-04-28 ENCOUNTER — Encounter: Admission: RE | Payer: Self-pay | Source: Ambulatory Visit

## 2018-04-28 ENCOUNTER — Telehealth: Payer: Self-pay

## 2018-04-28 ENCOUNTER — Other Ambulatory Visit: Payer: Self-pay

## 2018-04-28 ENCOUNTER — Ambulatory Visit: Admission: RE | Admit: 2018-04-28 | Payer: Medicare Other | Source: Ambulatory Visit | Admitting: Gastroenterology

## 2018-04-28 DIAGNOSIS — Z86010 Personal history of colonic polyps: Secondary | ICD-10-CM

## 2018-04-28 SURGERY — COLONOSCOPY WITH PROPOFOL
Anesthesia: General

## 2018-04-28 MED ORDER — PEG 3350-KCL-NA BICARB-NACL 420 G PO SOLR: 4000.0000 mL | Freq: Once | ORAL | 0 refills | Status: AC

## 2018-04-28 NOTE — Telephone Encounter (Signed)
Attempted to contact patient for rescheduling procedure.   Unable to leave voicemail; mailbox full.  2nd phone call: was able to contact Molly [name]. Rescheduled for 05/05/18. Resent Rx to pharmacy. Resent prep instructions to MyChart.

## 2018-05-04 ENCOUNTER — Encounter: Payer: Self-pay | Admitting: *Deleted

## 2018-05-05 ENCOUNTER — Ambulatory Visit
Admission: RE | Admit: 2018-05-05 | Discharge: 2018-05-05 | Disposition: A | Discharge status: Home / Self Care | Payer: Medicare Other | Source: Ambulatory Visit | Attending: Gastroenterology | Admitting: Gastroenterology

## 2018-05-05 ENCOUNTER — Encounter
Admission: RE | Disposition: A | Discharge status: Home / Self Care | Payer: Self-pay | Source: Ambulatory Visit | Attending: Gastroenterology

## 2018-05-05 ENCOUNTER — Ambulatory Visit: Payer: Medicare Other | Admitting: Anesthesiology

## 2018-05-05 ENCOUNTER — Encounter: Payer: Self-pay | Admitting: *Deleted

## 2018-05-05 DIAGNOSIS — K219 Gastro-esophageal reflux disease without esophagitis: Secondary | ICD-10-CM | POA: Insufficient documentation

## 2018-05-05 DIAGNOSIS — I10 Essential (primary) hypertension: Secondary | ICD-10-CM | POA: Diagnosis not present

## 2018-05-05 DIAGNOSIS — I739 Peripheral vascular disease, unspecified: Secondary | ICD-10-CM | POA: Diagnosis not present

## 2018-05-05 DIAGNOSIS — R7303 Prediabetes: Secondary | ICD-10-CM | POA: Diagnosis not present

## 2018-05-05 DIAGNOSIS — K635 Polyp of colon: Secondary | ICD-10-CM | POA: Diagnosis not present

## 2018-05-05 DIAGNOSIS — J45909 Unspecified asthma, uncomplicated: Secondary | ICD-10-CM | POA: Insufficient documentation

## 2018-05-05 DIAGNOSIS — Z1211 Encounter for screening for malignant neoplasm of colon: Secondary | ICD-10-CM | POA: Diagnosis not present

## 2018-05-05 DIAGNOSIS — I2510 Atherosclerotic heart disease of native coronary artery without angina pectoris: Secondary | ICD-10-CM | POA: Diagnosis not present

## 2018-05-05 DIAGNOSIS — Z86010 Personal history of colonic polyps: Secondary | ICD-10-CM | POA: Diagnosis not present

## 2018-05-05 DIAGNOSIS — D125 Benign neoplasm of sigmoid colon: Secondary | ICD-10-CM | POA: Insufficient documentation

## 2018-05-05 DIAGNOSIS — D120 Benign neoplasm of cecum: Secondary | ICD-10-CM | POA: Diagnosis not present

## 2018-05-05 DIAGNOSIS — Z79899 Other long term (current) drug therapy: Secondary | ICD-10-CM | POA: Insufficient documentation

## 2018-05-05 DIAGNOSIS — E785 Hyperlipidemia, unspecified: Secondary | ICD-10-CM | POA: Diagnosis not present

## 2018-05-05 DIAGNOSIS — D126 Benign neoplasm of colon, unspecified: Secondary | ICD-10-CM | POA: Diagnosis not present

## 2018-05-05 DIAGNOSIS — Z8673 Personal history of transient ischemic attack (TIA), and cerebral infarction without residual deficits: Secondary | ICD-10-CM | POA: Insufficient documentation

## 2018-05-05 HISTORY — PX: COLONOSCOPY WITH PROPOFOL: SHX5780

## 2018-05-05 SURGERY — COLONOSCOPY WITH PROPOFOL
Anesthesia: General

## 2018-05-05 MED ORDER — PHENYLEPHRINE HCL 10 MG/ML IJ SOLN
INTRAMUSCULAR | Status: DC | PRN
  Administered 2018-05-05 (×2): 100 ug via INTRAVENOUS

## 2018-05-05 MED ORDER — EPHEDRINE SULFATE 50 MG/ML IJ SOLN
INTRAMUSCULAR | Status: DC | PRN
  Administered 2018-05-05: 10 mg via INTRAVENOUS

## 2018-05-05 MED ORDER — SODIUM CHLORIDE 0.9 % IV SOLN
INTRAVENOUS | Status: DC
  Administered 2018-05-05: 13:00:00 via INTRAVENOUS

## 2018-05-05 MED ORDER — PROPOFOL 500 MG/50ML IV EMUL
INTRAVENOUS | Status: DC | PRN
  Administered 2018-05-05: 150 ug/kg/min via INTRAVENOUS

## 2018-05-05 MED ORDER — PROPOFOL 10 MG/ML IV BOLUS
INTRAVENOUS | Status: DC | PRN
  Administered 2018-05-05: 50 mg via INTRAVENOUS

## 2018-05-05 NOTE — Anesthesia Preprocedure Evaluation (Signed)
Anesthesia Evaluation  Patient identified by MRN, date of birth, ID band Patient awake    Reviewed: Allergy & Precautions, H&P , NPO status , Patient's Chart, lab work & pertinent test results, reviewed documented beta blocker date and time   Airway Mallampati: II   Neck ROM: full    Dental  (+) Poor Dentition   Pulmonary neg pulmonary ROS, asthma ,    Pulmonary exam normal        Cardiovascular Exercise Tolerance: Good hypertension, On Medications + CAD and + Peripheral Vascular Disease  negative cardio ROS Normal cardiovascular exam Rhythm:regular Rate:Normal     Neuro/Psych CVA, No Residual Symptoms negative neurological ROS  negative psych ROS   GI/Hepatic negative GI ROS, Neg liver ROS, GERD  ,  Endo/Other  negative endocrine ROSMorbid obesity  Renal/GU negative Renal ROS  negative genitourinary   Musculoskeletal   Abdominal   Peds  Hematology negative hematology ROS (+)   Anesthesia Other Findings Past Medical History: No date: Asthma     Comment:  history of asthma No date: GERD (gastroesophageal reflux disease)     Comment:  takes prilosec prn 12/10/2015: History of CVA (cerebrovascular accident) 12/10/2015: Hyperlipidemia LDL goal <70 No date: Hypertension No date: Pre-diabetes 10/02/2017: Prediabetes     Comment:  A1c 6 in January 2018 1990: Stroke [facility])     Comment:   no residual effects Past Surgical History: No date: CHOLECYSTECTOMY 01/08/2017: COLONOSCOPY WITH PROPOFOL; N/A     Comment:  Procedure: COLONOSCOPY WITH PROPOFOL;  Surgeon: [name], MD;  Location: [facility] ENDOSCOPY;  Service: Endoscopy;              Laterality: N/A; No date: HERNIA REPAIR 03/50/0938: UMBILICAL HERNIA REPAIR; N/A     Comment:  Procedure: HERNIA REPAIR UMBILICAL ADULT;  Surgeon:               Jules Husbands, MD;  Location: [facility] ORS;  Service:               General;  Laterality: N/A; BMI    Body  Mass Index:  42.82 kg/m     Reproductive/Obstetrics negative OB ROS                             Anesthesia Physical Anesthesia Plan  ASA: III  Anesthesia Plan: General   Post-op Pain Management:    Induction:   PONV Risk Score and Plan:   Airway Management Planned:   Additional Equipment:   Intra-op Plan:   Post-operative Plan:   Informed Consent: I have reviewed the patients History and Physical, chart, labs and discussed the procedure including the risks, benefits and alternatives for the proposed anesthesia with the patient or authorized representative who has indicated his/her understanding and acceptance.   Dental Advisory Given  Plan Discussed with: CRNA  Anesthesia Plan Comments:         Anesthesia Quick Evaluation

## 2018-05-05 NOTE — H&P (Signed)
[name], MD [address], [place], [place], [place], [address] [id number], [place], [place], [place], [address] Phone: [phone]  Fax: [phone]  Primary Care Physician:  Steele Sizer, MD   Pre-Procedure History & Physical: HPI:  Molly [name] is a 67 y.o. female is here for an colonoscopy.   Past Medical History:  Diagnosis Date  . Asthma    history of asthma  . GERD (gastroesophageal reflux disease)    takes prilosec prn  . History of CVA (cerebrovascular accident) 12/10/2015  . Hyperlipidemia LDL goal <70 12/10/2015  . Hypertension   . Pre-diabetes   . Prediabetes 10/02/2017   A1c 6 in January 2018  . Stroke [facility]) 1990    no residual effects    Past Surgical History:  Procedure Laterality Date  . CHOLECYSTECTOMY    . COLONOSCOPY WITH PROPOFOL N/A 01/08/2017   Procedure: COLONOSCOPY WITH PROPOFOL;  Surgeon: [name], MD;  Location: [facility] ENDOSCOPY;  Service: Endoscopy;  Laterality: N/A;  . HERNIA REPAIR    . UMBILICAL HERNIA REPAIR N/A 11/17/2017   Procedure: HERNIA REPAIR UMBILICAL ADULT;  Surgeon: Jules Husbands, MD;  Location: [facility] ORS;  Service: General;  Laterality: N/A;    Prior to Admission medications   Medication Sig Start Date End Date Taking? Authorizing Provider  albuterol (PROVENTIL HFA;VENTOLIN HFA) 108 (90 Base) MCG/ACT inhaler Inhale 2 puffs into the lungs every 4 (four) hours as needed for wheezing or shortness of breath. 11/11/16  Yes Sowles, Drue Stager, MD  calcium carbonate 1250 MG capsule Take 1,250 mg daily by mouth.   Yes [provider]  furosemide (LASIX) 40 MG tablet Take 1 tablet (40 mg total) by mouth daily. 04/06/18  Yes Sowles, Drue Stager, MD  Na Sulfate-K Sulfate-Mg Sulf (SUPREP BOWEL PREP KIT) 17.5-3.13-1.6 GM/177ML SOLN Take 1 kit by mouth as directed. 04/12/18  Yes [name], MD  olmesartan-hydrochlorothiazide [facility] HCT) 40-12.5 MG tablet Take 1 tablet daily by mouth. 11/10/17  Yes Sowles, Drue Stager, MD  omeprazole  (PRILOSEC) 20 MG capsule Take 20 mg daily by mouth.   Yes [provider]  Triamcinolone Acetonide (NASACORT ALLERGY 24HR NA) Place 1 spray daily as needed into the nose (allergies).   Yes [provider]  azithromycin (ZITHROMAX) 250 MG tablet Take two first day and one daily after that Patient not taking: Reported on 05/05/2018 04/06/18   Steele Sizer, MD  cetirizine (ZYRTEC) 10 MG tablet Take 1 tablet (10 mg total) by mouth daily as needed. 10/02/17   Arnetha Courser, MD  chlorpheniramine-HYDROcodone (TUSSIONEX PENNKINETIC ER) 10-8 MG/5ML SUER Take 5 mLs by mouth every 12 (twelve) hours as needed. 04/06/18   Steele Sizer, MD  diclofenac sodium (VOLTAREN) 1 % GEL Apply 2 g 3 (three) times daily as needed topically (pain).     [provider]  ezetimibe (ZETIA) 10 MG tablet Take 1 tablet (10 mg total) daily by mouth. Patient taking differently: Take 10 mg daily by mouth.  11/10/17   Sowles, Drue Stager, MD  fluticasone furoate-vilanterol (BREO ELLIPTA) 100-25 MCG/INH AEPB Inhale 1 puff into the lungs daily. Patient not taking: Reported on 05/05/2018 04/06/18   Steele Sizer, MD  Multiple Vitamin (MULTIVITAMIN WITH MINERALS) TABS tablet Take 1 tablet 4 (four) times a week by mouth.    [provider]  predniSONE (DELTASONE) 10 MG tablet Take 1 tablet (10 mg total) by mouth 2 (two) times daily with a meal. Patient not taking: Reported on 05/05/2018 04/06/18   Steele Sizer, MD  triamcinolone cream (KENALOG) 0.1 % Apply 1 application topically 2 (two) times daily. 02/25/18   Steele Sizer, MD    Allergies as of 04/28/2018 - Review Complete 04/06/2018  Allergen Reaction Noted  . Nsaids Hives and Rash 12/10/2015  . Statins Other (See Comments) 12/10/2015  . Victoza [liraglutide]  11/09/2017  . Crestor [rosuvastatin calcium] Rash 02/23/2017    Family History  Problem Relation Age of Onset  . Stroke Mother   . Hypertension Mother   . Dementia Mother   . Alzheimer's  disease Mother   . Gout Father   . Asthma Father   . Hypertension Father   . Dementia Father   . Healthy Sister   . Stroke Brother   . Alzheimer's disease Brother   . Healthy Daughter   . Hypertension Brother   . Healthy Brother   . Cancer Paternal Grandmother   . Healthy Sister   . Healthy Sister   . Hypertension Sister   . Hypertension Sister   . Stroke Brother   . Alzheimer's disease Brother   . Stroke Brother   . Hypertension Brother   . Stroke Brother   . Hypertension Brother   . Healthy Brother   . Healthy Brother   . Hypertension Daughter   . Breast cancer Neg Hx     Social History   Socioeconomic History  . Marital status: Divorced    Spouse name: Not on file  . Number of children: 2  . Years of education: some college  . Highest education level: 12th grade  Occupational History  . Occupation: Retired  Scientific laboratory technician  . Financial resource strain: Not hard at all  . Food insecurity:    Worry: Never true    Inability: Never true  . Transportation needs:    Medical: No    Non-medical: No  Tobacco Use  . Smoking status: Never Smoker  . Smokeless tobacco: Never Used  . Tobacco comment: smoking cessation materials not required  Substance and Sexual Activity  . Alcohol use: No    Alcohol/week: 0.0 oz  . Drug use: No  . Sexual activity: Never  Lifestyle  . Physical activity:    Days per week: 0 days    Minutes per session: 0 min  . Stress: Not at all  Relationships  . Social connections:    Talks on phone: Patient refused    Gets together: Patient refused    Attends religious service: Patient refused    Active member of club or organization: Patient refused    Attends meetings of clubs or organizations: Patient refused    Relationship status: Divorced  . Intimate partner violence:    Fear of current or ex partner: No    Emotionally abused: No    Physically abused: No    Forced sexual activity: No  Other Topics Concern  . Not on file  Social  History Narrative  . Not on file    Review of Systems: See HPI, otherwise negative ROS  Physical Exam: BP 122/66   Pulse 66   Temp (!) 97.5 F (36.4 C) (Tympanic)   Resp 18   Ht 4' 11"  (1.499 m)   Wt 212 lb (96.2 kg)   SpO2 97%   BMI 42.82 kg/m  General:   Alert,  pleasant and cooperative in NAD Head:  Normocephalic and atraumatic. Neck:  Supple; no masses or thyromegaly. Lungs:  Clear throughout to auscultation, normal respiratory effort.    Heart:  +S1, +S2, Regular rate  and rhythm, No edema. Abdomen:  Soft, nontender and nondistended. Normal bowel sounds, without guarding, and without rebound.   Neurologic:  Alert and  oriented x4;  grossly normal neurologically.  Impression/Plan: Molly [name] is here for an colonoscopy to be performed for surveillance due to prior history of colon polyps   Risks, benefits, limitations, and alternatives regarding  colonoscopy have been reviewed with the patient.  Questions have been answered.  All parties agreeable.   [name], MD  05/05/2018, 1:29 PM

## 2018-05-05 NOTE — Anesthesia Procedure Notes (Signed)
Date/Time: 05/05/2018 1:48 PM Performed by: Nelda Marseille, CRNA Pre-anesthesia Checklist: Patient identified, Emergency Drugs available, Suction available, Patient being monitored and Timeout performed Oxygen Delivery Method: Nasal cannula

## 2018-05-05 NOTE — Transfer of Care (Signed)
Immediate Anesthesia Transfer of Care Note  Patient: Molly [name]  Procedure(s) Performed: COLONOSCOPY WITH PROPOFOL (N/A )  Patient Location: PACU  Anesthesia Type:General  Level of Consciousness: sedated  Airway & Oxygen Therapy: Patient Spontanous Breathing and Patient connected to nasal cannula oxygen  Post-op Assessment: Report given to RN and Post -op Vital signs reviewed and stable  Post vital signs: Reviewed and stable  Last Vitals:  Vitals Value Taken Time  BP 110/54 05/05/2018  2:08 PM  Temp 36.2 C 05/05/2018  2:06 PM  Pulse 70 05/05/2018  2:09 PM  Resp 18 05/05/2018  2:09 PM  SpO2 97 % 05/05/2018  2:09 PM  Vitals shown include unvalidated device data.  Last Pain:  Vitals:   05/05/18 1406  TempSrc: Tympanic  PainSc: 0-No pain         Complications: No apparent anesthesia complications

## 2018-05-05 NOTE — Op Note (Signed)
[facility] Gastroenterology Patient Name: Molly [name] Procedure Date: 05/05/2018 1:36 PM MRN: [id number] Account #: [id number] Date of Birth: July 06, 1951 Admit Type: Outpatient Age: 67 Room: [facility] ENDO ROOM 1 Gender: Female Note Status: Finalized Procedure:            Colonoscopy Indications:          High risk colon cancer surveillance: Personal history                        of colonic polyps Providers:            [name] MD, MD Referring MD:         Bethena Roys. Sowles, MD (Referring MD) Medicines:            Monitored Anesthesia Care Complications:        No immediate complications. Procedure:            Pre-Anesthesia Assessment:                       - Prior to the procedure, a History and Physical was                        performed, and patient medications, allergies and                        sensitivities were reviewed. The patient's tolerance of                        previous anesthesia was reviewed.                       - The risks and benefits of the procedure and the                        sedation options and risks were discussed with the                        patient. All questions were answered and informed                        consent was obtained.                       - ASA Grade Assessment: III - A patient with severe                        systemic disease.                       After obtaining informed consent, the colonoscope was                        passed under direct vision. Throughout the procedure,                        the patient's blood pressure, pulse, and oxygen                        saturations were monitored continuously. The  Colonoscope was introduced through the anus and                        advanced to the the cecum, identified by the                        appendiceal orifice, IC valve and transillumination.                        The colonoscopy was performed with ease. The patient                     tolerated the procedure well. The quality of the bowel                        preparation was good. Findings:      The perianal and digital rectal examinations were normal.      Two sessile polyps were found in the cecum. The polyps were 3 to 4 mm in       size. These polyps were removed with a cold biopsy forceps. Resection       and retrieval were complete.      A 3 mm polyp was found in the sigmoid colon. The polyp was sessile. The       polyp was removed with a cold biopsy forceps. Resection and retrieval       were complete.      A 6 mm polyp was found in the sigmoid colon. The polyp was sessile. The       polyp was removed with a cold snare. Resection and retrieval were       complete.      The exam was otherwise without abnormality on direct and retroflexion       views. Impression:           - Two 3 to 4 mm polyps in the cecum, removed with a                        cold biopsy forceps. Resected and retrieved.                       - One 3 mm polyp in the sigmoid colon, removed with a                        cold biopsy forceps. Resected and retrieved.                       - One 6 mm polyp in the sigmoid colon, removed with a                        cold snare. Resected and retrieved.                       - The examination was otherwise normal on direct and                        retroflexion views. Recommendation:       - Discharge patient to home (with escort).                       - Resume previous diet.                       -  Continue present medications.                       - Await pathology results.                       - Repeat colonoscopy in 3 years for surveillance. Procedure Code(s):    --- Professional ---                       [phone], Colonoscopy, flexible; with removal of tumor(s),                        polyp(s), or other lesion(s) by snare technique                       45380, 71, Colonoscopy, flexible; with biopsy, single                         or multiple Diagnosis Code(s):    --- Professional ---                       Z86.010, Personal history of colonic polyps                       D12.0, Benign neoplasm of cecum                       D12.5, Benign neoplasm of sigmoid colon CPT copyright 2017 [employer]. All rights reserved. The codes documented in this report are preliminary and upon coder review may  be revised to meet current compliance requirements. [name], MD [name] MD, MD 05/05/2018 2:01:43 PM This report has been signed electronically. Number of Addenda: 0 Note Initiated On: 05/05/2018 1:36 PM Scope Withdrawal Time: 0 hours 13 minutes 17 seconds  Total Procedure Duration: 0 hours 16 minutes 39 seconds       [facility]

## 2018-05-05 NOTE — Anesthesia Post-op Follow-up Note (Signed)
Anesthesia QCDR form completed.        

## 2018-05-06 ENCOUNTER — Encounter: Payer: Self-pay | Admitting: Gastroenterology

## 2018-05-07 ENCOUNTER — Encounter: Payer: Self-pay | Admitting: Family Medicine

## 2018-05-07 ENCOUNTER — Ambulatory Visit (INDEPENDENT_AMBULATORY_CARE_PROVIDER_SITE_OTHER): Payer: Medicare Other | Admitting: Family Medicine

## 2018-05-07 VITALS — BP 122/64 | HR 69 | Temp 98.1°F | Resp 16 | Ht 59.0 in | Wt 211.0 lb

## 2018-05-07 DIAGNOSIS — I10 Essential (primary) hypertension: Secondary | ICD-10-CM

## 2018-05-07 DIAGNOSIS — I700 Atherosclerosis of aorta: Secondary | ICD-10-CM | POA: Diagnosis not present

## 2018-05-07 DIAGNOSIS — R7303 Prediabetes: Secondary | ICD-10-CM | POA: Diagnosis not present

## 2018-05-07 DIAGNOSIS — R05 Cough: Secondary | ICD-10-CM | POA: Diagnosis not present

## 2018-05-07 DIAGNOSIS — J4530 Mild persistent asthma, uncomplicated: Secondary | ICD-10-CM | POA: Diagnosis not present

## 2018-05-07 DIAGNOSIS — E785 Hyperlipidemia, unspecified: Secondary | ICD-10-CM | POA: Diagnosis not present

## 2018-05-07 DIAGNOSIS — I43 Cardiomyopathy in diseases classified elsewhere: Secondary | ICD-10-CM | POA: Diagnosis not present

## 2018-05-07 DIAGNOSIS — I119 Hypertensive heart disease without heart failure: Secondary | ICD-10-CM

## 2018-05-07 DIAGNOSIS — R600 Localized edema: Secondary | ICD-10-CM | POA: Diagnosis not present

## 2018-05-07 DIAGNOSIS — M170 Bilateral primary osteoarthritis of knee: Secondary | ICD-10-CM | POA: Diagnosis not present

## 2018-05-07 DIAGNOSIS — E8881 Metabolic syndrome: Secondary | ICD-10-CM

## 2018-05-07 LAB — SURGICAL PATHOLOGY

## 2018-05-07 MED ORDER — METFORMIN HCL ER 750 MG PO TB24: 750.0000 mg | ORAL_TABLET | Freq: Every day | ORAL | 1 refills | Status: DC

## 2018-05-07 MED ORDER — FUROSEMIDE 40 MG PO TABS: 40.0000 mg | ORAL_TABLET | Freq: Every day | ORAL | 1 refills | Status: DC

## 2018-05-07 MED ORDER — DICLOFENAC SODIUM 1 % TD GEL: 2.0000 g | Freq: Three times a day (TID) | TRANSDERMAL | 1 refills | Status: DC | PRN

## 2018-05-07 MED ORDER — OLMESARTAN MEDOXOMIL-HCTZ 40-12.5 MG PO TABS: 1.0000 | ORAL_TABLET | Freq: Every day | ORAL | 1 refills | Status: DC

## 2018-05-07 MED ORDER — EZETIMIBE 10 MG PO TABS: 10.0000 mg | ORAL_TABLET | Freq: Every day | ORAL | 1 refills | Status: DC

## 2018-05-07 NOTE — Progress Notes (Signed)
Name: Molly [name]   MRN: [id number]    DOB: October 06, 1951   Date:05/07/2018       Progress Note  Subjective  Chief Complaint  Chief Complaint  Patient presents with  . Medication Refill  . Hypertension    Denies any symptoms  . Asthma    Well controlled   . Congestive Heart Failure  . Dyslipidemia  . Insulin Resistance    HPI  Asthma: mild intermittent , last flare was 03/2018, she is feeling better still has a cough, but not as productive now. Still worse when outside with high pollen count. She stopped using Breo. She still has intermittent wheezing, no SO with activity. She has to resume Breo  Insulin resistance: she took Victoza but caused pancreatitis and had to stop, she still has not gained the weight back, trying to eat healthy and smaller portion. No polyphagia, polydipsia or polyuria.   Cardiomyopathy without CHF: out of lasix since Saturday, edema is better, but would like a refill, no chest pain or SOB. On ARB  Morbid obesity: off victoza because of episode of pancreatitis, also has prediabetes, she did not have labs done, she is willing to take Metformin  Dyslipidemia: cannot tolerate statin therapy, but is taking Zetia and we will check labs today. She has atherosclerosis aorta, cannot tolerate aspirin, allergic to NSAID's  HTN: taking medication and is doing well, no chest pain or decrease in exercise tolerance    OA: she can tolerate topical voltaren gel and takes Tylenol prn for pain, currently no swelling but has intermittent left knee pain. She also has hand pain   Patient Active Problem List   Diagnosis Date Noted  . Umbilical hernia without obstruction and without gangrene   . Atherosclerosis of right coronary artery 11/10/2017  . Atherosclerosis of abdominal aorta ([place]) 11/10/2017  . Prediabetes 10/02/2017  . Pain in limb 03/31/2017  . Perennial allergic rhinitis with seasonal variation 05/14/2016  . Asthma, mild intermittent, well-controlled  05/14/2016  . Hypertension goal BP (blood pressure) < 140/90 12/10/2015  . Cardiomyopathy due to hypertension ([place]) 12/10/2015  . History of CVA (cerebrovascular accident) 12/10/2015  . Hyperlipidemia LDL goal <70 12/10/2015  . Statin intolerance 12/10/2015    Past Surgical History:  Procedure Laterality Date  . CHOLECYSTECTOMY    . COLONOSCOPY WITH PROPOFOL N/A 01/08/2017   Procedure: COLONOSCOPY WITH PROPOFOL;  Surgeon: [name], MD;  Location: [facility] ENDOSCOPY;  Service: Endoscopy;  Laterality: N/A;  . COLONOSCOPY WITH PROPOFOL N/A 05/05/2018   Procedure: COLONOSCOPY WITH PROPOFOL;  Surgeon: [name], MD;  Location: [facility] ENDOSCOPY;  Service: Gastroenterology;  Laterality: N/A;  . HERNIA REPAIR    . UMBILICAL HERNIA REPAIR N/A 11/17/2017   Procedure: HERNIA REPAIR UMBILICAL ADULT;  Surgeon: Jules Husbands, MD;  Location: [facility] ORS;  Service: General;  Laterality: N/A;    Family History  Problem Relation Age of Onset  . Stroke Mother   . Hypertension Mother   . Dementia Mother   . Alzheimer's disease Mother   . Gout Father   . Asthma Father   . Hypertension Father   . Dementia Father   . Healthy Sister   . Stroke Brother   . Alzheimer's disease Brother   . Healthy Daughter   . Hypertension Brother   . Healthy Brother   . Cancer Paternal Grandmother   . Healthy Sister   . Healthy Sister   . Hypertension Sister   . Hypertension Sister   . Stroke Brother   .  Alzheimer's disease Brother   . Stroke Brother   . Hypertension Brother   . Stroke Brother   . Hypertension Brother   . Healthy Brother   . Healthy Brother   . Hypertension Daughter   . Breast cancer Neg Hx     Social History   Socioeconomic History  . Marital status: Divorced    Spouse name: Not on file  . Number of children: 2  . Years of education: some college  . Highest education level: 12th grade  Occupational History  . Occupation: Retired  Scientific laboratory technician  . Financial resource strain: Not hard at  all  . Food insecurity:    Worry: Never true    Inability: Never true  . Transportation needs:    Medical: No    Non-medical: No  Tobacco Use  . Smoking status: Never Smoker  . Smokeless tobacco: Never Used  . Tobacco comment: smoking cessation materials not required  Substance and Sexual Activity  . Alcohol use: No    Alcohol/week: 0.0 oz  . Drug use: No  . Sexual activity: Never  Lifestyle  . Physical activity:    Days per week: 0 days    Minutes per session: 0 min  . Stress: Not at all  Relationships  . Social connections:    Talks on phone: Patient refused    Gets together: Patient refused    Attends religious service: Patient refused    Active member of club or organization: Patient refused    Attends meetings of clubs or organizations: Patient refused    Relationship status: Divorced  . Intimate partner violence:    Fear of current or ex partner: No    Emotionally abused: No    Physically abused: No    Forced sexual activity: No  Other Topics Concern  . Not on file  Social History Narrative  . Not on file     Current Outpatient Medications:  .  albuterol (PROVENTIL HFA;VENTOLIN HFA) 108 (90 Base) MCG/ACT inhaler, Inhale 2 puffs into the lungs every 4 (four) hours as needed for wheezing or shortness of breath., Disp: 1 Inhaler, Rfl: 0 .  calcium carbonate 1250 MG capsule, Take 1,250 mg daily by mouth., Disp: , Rfl:  .  cetirizine (ZYRTEC) 10 MG tablet, Take 1 tablet (10 mg total) by mouth daily as needed., Disp: 90 tablet, Rfl: 1 .  diclofenac sodium (VOLTAREN) 1 % GEL, Apply 2 g topically 3 (three) times daily as needed (pain)., Disp: 100 g, Rfl: 1 .  ezetimibe (ZETIA) 10 MG tablet, Take 1 tablet (10 mg total) by mouth daily., Disp: 90 tablet, Rfl: 1 .  furosemide (LASIX) 40 MG tablet, Take 1 tablet (40 mg total) by mouth daily., Disp: 90 tablet, Rfl: 1 .  Multiple Vitamin (MULTIVITAMIN WITH MINERALS) TABS tablet, Take 1 tablet 4 (four) times a week by mouth.,  Disp: , Rfl:  .  olmesartan-hydrochlorothiazide (BENICAR HCT) 40-12.5 MG tablet, Take 1 tablet by mouth daily., Disp: 90 tablet, Rfl: 1 .  omeprazole (PRILOSEC) 20 MG capsule, Take 20 mg daily by mouth., Disp: , Rfl:  .  Triamcinolone Acetonide (NASACORT ALLERGY 24HR NA), Place 1 spray daily as needed into the nose (allergies)., Disp: , Rfl:  .  triamcinolone cream (KENALOG) 0.1 %, Apply 1 application topically 2 (two) times daily., Disp: 30 g, Rfl: 0 .  fluticasone furoate-vilanterol (BREO ELLIPTA) 100-25 MCG/INH AEPB, Inhale 1 puff into the lungs daily. (Patient not taking: Reported on 05/05/2018), Disp: 60 each,  Rfl: 0  Allergies  Allergen Reactions  . Nsaids Hives and Rash  . Statins Other (See Comments)    Joint pains  . Victoza [Liraglutide] Other (See Comments)    pancreatitis  . Crestor [Rosuvastatin Calcium] Rash     ROS  Constitutional: Negative for fever or weight change.  Respiratory: Negative for cough and shortness of breath.   Cardiovascular: Negative for chest pain or palpitations.  Gastrointestinal: Negative for abdominal pain, no bowel changes.  Musculoskeletal: Negative for gait problem or joint swelling.  Skin: Negative for rash.  Neurological: Negative for dizziness or headache.  No other specific complaints in a complete review of systems (except as listed in HPI above).  Objective  Vitals:   05/07/18 1447  BP: 122/64  Pulse: 69  Resp: 16  Temp: 98.1 F (36.7 C)  TempSrc: Oral  SpO2: 95%  Weight: 211 lb (95.7 kg)  Height: 4\' 11"  (1.499 m)    Body mass index is 42.62 kg/m.  Physical Exam  Constitutional: Patient appears well-developed and well-nourished. Obese  No distress.  HEENT: head atraumatic, normocephalic, pupils equal and reactive to light,  neck supple, throat within normal limits Cardiovascular: Normal rate, regular rhythm and normal heart sounds.  No murmur heard. Trace  BLE edema. Pulmonary/Chest: Effort normal and breath sounds  normal. No respiratory distress. Abdominal: Soft.  There is no tenderness. Psychiatric: Patient has a normal mood and affect. behavior is normal. Judgment and thought content normal.   PHQ2/9: Depression screen [facility] 2/9 05/07/2018 04/06/2018 11/10/2017 10/02/2017 11/11/2016  Decreased Interest 1 0 0 0 0  Down, Depressed, Hopeless 0 0 0 0 0  PHQ - 2 Score 1 0 0 0 0  Altered sleeping 0 0 - - -  Tired, decreased energy 2 0 - - -  Change in appetite 0 0 - - -  Feeling bad or failure about yourself  0 0 - - -  Trouble concentrating 1 0 - - -  Moving slowly or fidgety/restless 0 0 - - -  Suicidal thoughts 0 0 - - -  PHQ-9 Score 4 0 - - -  Difficult doing work/chores Not difficult at all Not difficult at all - - -    Fall Risk: Fall Risk  05/07/2018 04/06/2018 11/10/2017 10/02/2017 11/11/2016  Falls in the past year? No No No No No  Risk for fall due to : - Impaired vision;Medication side effect - - -  Risk for fall due to: Comment - blurred vision, wears eyeglasses - - -     Functional Status Survey: Is the patient deaf or have difficulty hearing?: No Does the patient have difficulty seeing, even when wearing glasses/contacts?: Yes Does the patient have difficulty concentrating, remembering, or making decisions?: No Does the patient have difficulty walking or climbing stairs?: No Does the patient have difficulty dressing or bathing?: No Does the patient have difficulty doing errands alone such as visiting a doctor's office or shopping?: No   Assessment & Plan  1. Mild persistent asthma without complication  - Spirometry with Graph  2. Hypertension goal BP (blood pressure) < 140/90  - olmesartan-hydrochlorothiazide (BENICAR HCT) 40-12.5 MG tablet; Take 1 tablet by mouth daily.  Dispense: 90 tablet; Refill: 1 - furosemide (LASIX) 40 MG tablet; Take 1 tablet (40 mg total) by mouth daily.  Dispense: 90 tablet; Refill: 1  3. Cardiomyopathy due to hypertension, without heart failure  (HCC)  - furosemide (LASIX) 40 MG tablet; Take 1 tablet (40 mg total) by mouth  daily.  Dispense: 90 tablet; Refill: 1  4. Hyperlipidemia LDL goal <70  - ezetimibe (ZETIA) 10 MG tablet; Take 1 tablet (10 mg total) by mouth daily.  Dispense: 90 tablet; Refill: 1  5. Atherosclerosis of abdominal aorta (HCC)  - ezetimibe (ZETIA) 10 MG tablet; Take 1 tablet (10 mg total) by mouth daily.  Dispense: 90 tablet; Refill: 1  6. Morbid obesity ([place])  Discussed with the patient the risk posed by an increased BMI. Discussed importance of portion control, calorie counting and at least 150 minutes of physical activity weekly. Avoid sweet beverages and drink more water. Eat at least 6 servings of fruit and vegetables daily   7. Bilateral lower extremity edema  - furosemide (LASIX) 40 MG tablet; Take 1 tablet (40 mg total) by mouth daily.  Dispense: 90 tablet; Refill: 1  8. Primary osteoarthritis of both knees  - diclofenac sodium (VOLTAREN) 1 % GEL; Apply 2 g topically 3 (three) times daily as needed (pain).  Dispense: 100 g; Refill: 1   9. Insulin resistance  - metFORMIN (GLUCOPHAGE-XR) 750 MG 24 hr tablet; Take 1 tablet (750 mg total) by mouth daily with breakfast.  Dispense: 90 tablet; Refill: 1

## 2018-05-08 ENCOUNTER — Encounter: Payer: Self-pay | Admitting: Gastroenterology

## 2018-05-08 LAB — COMPLETE METABOLIC PANEL WITH GFR
AG RATIO: 1.6 (calc) (ref 1.0–2.5)
ALBUMIN MSPROF: 4.1 g/dL (ref 3.6–5.1)
ALKALINE PHOSPHATASE (APISO): 82 U/L (ref 33–130)
ALT: 11 U/L (ref 6–29)
AST: 16 U/L (ref 10–35)
BUN: 10 mg/dL (ref 7–25)
CALCIUM: 9.7 mg/dL (ref 8.6–10.4)
CO2: 31 mmol/L (ref 20–32)
Chloride: 103 mmol/L (ref 98–110)
Creat: 0.72 mg/dL (ref 0.50–0.99)
GFR, EST NON AFRICAN AMERICAN: 87 mL/min/{1.73_m2} (ref >=60)
GFR, Est African American: 101 mL/min/{1.73_m2} (ref >=60)
GLOBULIN: 2.5 g/dL (ref 1.9–3.7)
Glucose, Bld: 90 mg/dL (ref 65–139)
POTASSIUM: 3.7 mmol/L (ref 3.5–5.3)
SODIUM: 142 mmol/L (ref 135–146)
Total Bilirubin: 0.3 mg/dL (ref 0.2–1.2)
Total Protein: 6.6 g/dL (ref 6.1–8.1)

## 2018-05-08 LAB — CBC WITH DIFFERENTIAL/PLATELET
BASOS PCT: 0.6 %
Basophils Absolute: 39 cells/uL (ref 0–200)
EOS ABS: 78 {cells}/uL (ref 15–500)
Eosinophils Relative: 1.2 %
HCT: 37.6 % (ref 35.0–45.0)
Hemoglobin: 12.5 g/dL (ref 11.7–15.5)
Lymphs Abs: 2945 cells/uL (ref 850–3900)
MCH: 29.6 pg (ref 27.0–33.0)
MCHC: 33.2 g/dL (ref 32.0–36.0)
MCV: 89.1 fL (ref 80.0–100.0)
MONOS PCT: 6.6 %
MPV: 9.9 fL (ref 7.5–12.5)
NEUTROS PCT: 46.3 %
Neutro Abs: 3010 cells/uL (ref 1500–7800)
PLATELETS: 397 10*3/uL (ref 140–400)
RBC: 4.22 10*6/uL (ref 3.80–5.10)
RDW: 13 % (ref 11.0–15.0)
TOTAL LYMPHOCYTE: 45.3 %
WBC mixed population: 429 cells/uL (ref 200–950)
WBC: 6.5 10*3/uL (ref 3.8–10.8)

## 2018-05-08 LAB — LIPID PANEL
CHOL/HDL RATIO: 3.9 (calc) (ref <5.0)
Cholesterol: 228 mg/dL — ABNORMAL HIGH (ref <200)
HDL: 59 mg/dL (ref >50)
LDL CHOLESTEROL (CALC): 142 mg/dL — AB
NON-HDL CHOLESTEROL (CALC): 169 mg/dL — AB (ref <130)
Triglycerides: 145 mg/dL (ref <150)

## 2018-05-08 LAB — HEMOGLOBIN A1C
Hgb A1c MFr Bld: 6.1 % of total Hgb — ABNORMAL HIGH (ref <5.7)
Mean Plasma Glucose: 128 (calc)
eAG (mmol/L): 7.1 (calc)

## 2018-05-10 ENCOUNTER — Telehealth: Payer: Self-pay

## 2018-05-10 NOTE — Telephone Encounter (Signed)
Sent in refill of Benicar to [facility] on [address] per patient request. Patient notified via voicemail.

## 2018-05-10 NOTE — Telephone Encounter (Signed)
This patient's daughter came in and stated that the preferred pharmacy [facility]) is out of her BP medication (Benicar) and they do not know when they are going to get more in stock. She is requesting that the Rx be changed to [place]. [address].

## 2018-05-10 NOTE — Anesthesia Postprocedure Evaluation (Signed)
Anesthesia Post Note  Patient: Molly [name]  Procedure(s) Performed: COLONOSCOPY WITH PROPOFOL (N/A )  Patient location during evaluation: PACU Anesthesia Type: General Level of consciousness: awake and alert Pain management: pain level controlled Vital Signs Assessment: post-procedure vital signs reviewed and stable Respiratory status: spontaneous breathing, nonlabored ventilation, respiratory function stable and patient connected to nasal cannula oxygen Cardiovascular status: blood pressure returned to baseline and stable Postop Assessment: no apparent nausea or vomiting Anesthetic complications: no     Last Vitals:  Vitals:   05/05/18 1416 05/05/18 1426  BP: 97/60 116/83  Pulse: 64 (!) 59  Resp: 18 11  Temp:    SpO2: 97% 98%    Last Pain:  Vitals:   05/06/18 0804  TempSrc:   PainSc: 0-No pain                 [name]

## 2018-05-26 ENCOUNTER — Other Ambulatory Visit: Payer: Self-pay | Admitting: Pharmacist

## 2018-05-26 NOTE — Patient Outreach (Signed)
[facility]) Care Management  09/17/1006  VESTER TITSWORTH [birth year] [id number]   Incoming call from [name] in response to the [facility] Medication Adherence Campaign. Speak with patient. HIPAA identifiers verified and verbal consent received.  Ms Cardona reports that she takes her olmesartan-hydrochlorothiazide once daily as directed. Denies any missed doses. Reports that she uses a weekly pillbox to help her to remember to take her medication everyday. Counsel patient on the importance of adherence to this medication. Ms. Hrivnak reports that there were two occasions earlier in the year when she had difficulty with getting her refills of this medication due to it being on back order at her pharmacy. Reports that she was able to find it and pick it up from [place]. Patient reports that she currently has plenty of this medication.   Reports that her PCP started her on metformin ER 750 mg once daily at her last office visit. However, patient reports that she has not yet picked up this prescription because the pharmacy was having difficulty with filling it.  PLAN  Will call patient's [place] to determine if there is a barrier to her getting her metformin filled and to determine if her olmesartan-hydrochlorothiazide is due to be refilled (and is in stock).  Harlow Asa, PharmD, [place] Management [phone]

## 2018-05-26 NOTE — Patient Outreach (Signed)
[place] [facility] Care Management  07/16/3671  CARLESHA SEIPLE [birth year] [id number]   Call and speak with [facility] at patient's [facility]. Hope reports that patient's metformin ER 750 mg can be filled and that the cost will be $3.40 for a 90 day supply. Reports that the patient's ezetimibe and diclofenac gel can also both be picked up. Reports that the patient's olmesartan-hydrochlorthiazide is not due for refill until July, but that the pharmacy does currently have the medication in stock.  Harlow Asa, PharmD, [place] Management [phone]

## 2018-05-26 NOTE — Patient Outreach (Signed)
[place] [facility]) Care Management  7/62/2633  ZEN FELLING [birth year] [id number]   Call back to Ms. Kallman. HIPAA identifiers verified.  Let Ms. Mallette know that per [facility] at [facility], her metformin ER 750 mg can be filled and that the cost will be $3.40 for a 90 day supply, that her ezetimibe and diclofenac gel can also both be picked up, and that her olmesartan-hydrochlorthiazide is not due for refill until July, but that the pharmacy does currently have the medication in stock.  Ms. Bills expresses appreciation for this information and reports that she will have her daughter pick up her metformin, ezetimibe and diclofenac gel today. Counsel patient on her new metformin prescription.  Per Office Visit note from patient's PCP from 05/07/18, patient reported that she had stopped using her Breo inhaler and was directed by her PCP to resume using this inhaler. Ms. Balducci reports that she has not yet started using the [facility] inhaler again. Counsel patient about proper use of this scheduled inhaler versus using her albuterol rescue inhaler. Patient verbalizes understanding and states that she will also have her Breo inhaler refilled and resume using this on a daily basis for improved asthma control, rinsing her mouth out after each use.  Patient denies any further medication questions/concerns at this time. Provide patient with my phone number.  Will close pharmacy episode at this time.  Harlow Asa, PharmD, [place] Management [phone]

## 2018-06-01 ENCOUNTER — Encounter: Payer: Self-pay | Admitting: Obstetrics and Gynecology

## 2018-06-01 ENCOUNTER — Other Ambulatory Visit (INDEPENDENT_AMBULATORY_CARE_PROVIDER_SITE_OTHER): Payer: Medicare Other

## 2018-06-01 ENCOUNTER — Ambulatory Visit (INDEPENDENT_AMBULATORY_CARE_PROVIDER_SITE_OTHER): Payer: Medicare Other | Admitting: Obstetrics and Gynecology

## 2018-06-01 VITALS — BP 124/80 | HR 62 | Ht 59.0 in | Wt 211.3 lb

## 2018-06-01 DIAGNOSIS — N814 Uterovaginal prolapse, unspecified: Secondary | ICD-10-CM | POA: Diagnosis not present

## 2018-06-01 DIAGNOSIS — Z4689 Encounter for fitting and adjustment of other specified devices: Secondary | ICD-10-CM | POA: Diagnosis not present

## 2018-06-01 DIAGNOSIS — N950 Postmenopausal bleeding: Secondary | ICD-10-CM

## 2018-06-01 NOTE — Progress Notes (Signed)
    GYNECOLOGY PROGRESS NOTE  Subjective:    Patient ID: Molly [name], female    DOB: 06/15/1951, 67 y.o.   MRN: [id number]  HPI  Patient is a 67 y.o. G6P2002 female who presents for pessary check.  She currently has a Grade 3 uterine prolapse with Grade 1 cystocele and rectocele. Notes pessary became dislodged ~ 4weeks ago after extreme coughing fits associated with her pneumonia, as well as having to help manage with her father who was hospitalized ~ 1 week after her diagnosis.  Denies difficulty moving her bowels or urinating. Denies pain.   Of note, patient reports an episode of PMB ~ 2 weeks ago. Notes that the bleeding lasted 2 days, and required use of a pad.  Also was associated with cramping.  Of note, last visit she had a medium sized polyp removed.  Has a h/o cervical polyps in the past .  The following portions of the patient's history were reviewed and updated as appropriate: allergies, current medications, past family history, past medical history, past social history, past surgical history and problem list.  Review of Systems Pertinent items noted in HPI and remainder of comprehensive ROS otherwise negative.    Objective:   Blood pressure 124/80, pulse 62, height 4\' 11"  (1.499 m), weight 211 lb 4.8 oz (95.8 kg). General appearance: alert and no distress  Abdomen: soft, non-tender; bowel sounds normal; no masses,  no organomegaly Pelvis:   External genitalia normal, rectovaginal septum normal. Speculum examination revealed normal vaginal mucosa with no lesions or lacerations.  Vagina without discharge. Grade 3 uterine prolapse present, with Grade 1 cystocele.  Cervix normal appearing, no lesions and no motion tenderness.  Uterus mobile, nontender, normal shape and size.  Adnexae non-palpable, nontender bilaterally.    Assessment:   Pessary reinsertion Uterine prolapse (Grade 3) with Grade 1 cystocele/rectocele. PMB  Plan:   - Will get ultrasound today to assess PMB.   Not likely secondary to pessary use as pessary was out 2 weeks prior to bleeding episode. Patient does have a h/o polyps, will assess uterine cavity for structural abnormalities, and assess uterine lining thickness.  - Size 2 3/4 pessary after ultrasound assessment.  -  Continue use of [place] gel at least every other week for pessary maintenance.  - RTC in 8 weeks for f/u pessary check   Molly Maid, MD [facility]

## 2018-06-01 NOTE — Progress Notes (Signed)
Pt stated that when she got sick she continued to cough and the pessary fell out.

## 2018-08-29 DIAGNOSIS — C541 Malignant neoplasm of endometrium: Secondary | ICD-10-CM

## 2018-08-29 HISTORY — DX: Malignant neoplasm of endometrium: C54.1

## 2018-09-01 ENCOUNTER — Encounter: Payer: Medicare Other | Admitting: Obstetrics and Gynecology

## 2018-09-07 ENCOUNTER — Other Ambulatory Visit (HOSPITAL_COMMUNITY)
Admission: RE | Admit: 2018-09-07 | Discharge: 2018-09-07 | Disposition: A | Discharge status: Home / Self Care | Payer: Medicare Other | Source: Ambulatory Visit | Attending: Obstetrics and Gynecology | Admitting: Obstetrics and Gynecology

## 2018-09-07 ENCOUNTER — Ambulatory Visit (INDEPENDENT_AMBULATORY_CARE_PROVIDER_SITE_OTHER): Payer: Medicare Other | Admitting: Family Medicine

## 2018-09-07 ENCOUNTER — Ambulatory Visit (INDEPENDENT_AMBULATORY_CARE_PROVIDER_SITE_OTHER): Payer: Medicare Other | Admitting: Obstetrics and Gynecology

## 2018-09-07 ENCOUNTER — Encounter: Payer: Self-pay | Admitting: Obstetrics and Gynecology

## 2018-09-07 ENCOUNTER — Encounter: Payer: Self-pay | Admitting: Family Medicine

## 2018-09-07 VITALS — BP 120/73 | HR 64 | Ht 59.0 in | Wt 212.7 lb

## 2018-09-07 VITALS — BP 110/64 | HR 69 | Temp 98.1°F | Resp 16 | Ht 59.0 in | Wt 212.1 lb

## 2018-09-07 DIAGNOSIS — I43 Cardiomyopathy in diseases classified elsewhere: Secondary | ICD-10-CM

## 2018-09-07 DIAGNOSIS — N814 Uterovaginal prolapse, unspecified: Secondary | ICD-10-CM | POA: Diagnosis not present

## 2018-09-07 DIAGNOSIS — N950 Postmenopausal bleeding: Secondary | ICD-10-CM

## 2018-09-07 DIAGNOSIS — D251 Intramural leiomyoma of uterus: Secondary | ICD-10-CM | POA: Diagnosis not present

## 2018-09-07 DIAGNOSIS — I700 Atherosclerosis of aorta: Secondary | ICD-10-CM | POA: Diagnosis not present

## 2018-09-07 DIAGNOSIS — E8881 Metabolic syndrome: Secondary | ICD-10-CM

## 2018-09-07 DIAGNOSIS — Z23 Encounter for immunization: Secondary | ICD-10-CM

## 2018-09-07 DIAGNOSIS — F330 Major depressive disorder, recurrent, mild: Secondary | ICD-10-CM

## 2018-09-07 DIAGNOSIS — I10 Essential (primary) hypertension: Secondary | ICD-10-CM | POA: Diagnosis not present

## 2018-09-07 DIAGNOSIS — R7303 Prediabetes: Secondary | ICD-10-CM

## 2018-09-07 DIAGNOSIS — E88819 Insulin resistance, unspecified: Secondary | ICD-10-CM

## 2018-09-07 DIAGNOSIS — E7800 Pure hypercholesterolemia, unspecified: Secondary | ICD-10-CM | POA: Diagnosis not present

## 2018-09-07 DIAGNOSIS — J4530 Mild persistent asthma, uncomplicated: Secondary | ICD-10-CM

## 2018-09-07 DIAGNOSIS — R600 Localized edema: Secondary | ICD-10-CM

## 2018-09-07 DIAGNOSIS — R739 Hyperglycemia, unspecified: Secondary | ICD-10-CM | POA: Diagnosis not present

## 2018-09-07 DIAGNOSIS — I119 Hypertensive heart disease without heart failure: Secondary | ICD-10-CM

## 2018-09-07 DIAGNOSIS — D250 Submucous leiomyoma of uterus: Secondary | ICD-10-CM

## 2018-09-07 DIAGNOSIS — M170 Bilateral primary osteoarthritis of knee: Secondary | ICD-10-CM

## 2018-09-07 DIAGNOSIS — L304 Erythema intertrigo: Secondary | ICD-10-CM

## 2018-09-07 DIAGNOSIS — E785 Hyperlipidemia, unspecified: Secondary | ICD-10-CM

## 2018-09-07 DIAGNOSIS — Z789 Other specified health status: Secondary | ICD-10-CM

## 2018-09-07 MED ORDER — ESCITALOPRAM OXALATE 10 MG PO TABS: 10.0000 mg | ORAL_TABLET | Freq: Every day | ORAL | 1 refills | Status: DC

## 2018-09-07 MED ORDER — EZETIMIBE 10 MG PO TABS: 10.0000 mg | ORAL_TABLET | Freq: Every day | ORAL | 1 refills | Status: DC

## 2018-09-07 MED ORDER — METFORMIN HCL ER 750 MG PO TB24: 750.0000 mg | ORAL_TABLET | Freq: Every day | ORAL | 1 refills | Status: DC

## 2018-09-07 MED ORDER — FUROSEMIDE 40 MG PO TABS: 40.0000 mg | ORAL_TABLET | Freq: Every day | ORAL | 1 refills | Status: DC

## 2018-09-07 MED ORDER — DICLOFENAC SODIUM 1 % TD GEL: 2.0000 g | Freq: Three times a day (TID) | TRANSDERMAL | 1 refills | Status: DC | PRN

## 2018-09-07 MED ORDER — KETOCONAZOLE 2 % EX CREA: 1.0000 "application " | TOPICAL_CREAM | Freq: Every day | CUTANEOUS | 0 refills | Status: DC | PRN

## 2018-09-07 MED ORDER — OLMESARTAN MEDOXOMIL-HCTZ 40-12.5 MG PO TABS: 1.0000 | ORAL_TABLET | Freq: Every day | ORAL | 1 refills | Status: DC

## 2018-09-07 MED ORDER — OLMESARTAN MEDOXOMIL-HCTZ 20-12.5 MG PO TABS: 1.0000 | ORAL_TABLET | Freq: Every day | ORAL | 1 refills | Status: DC

## 2018-09-07 NOTE — Progress Notes (Signed)
PT is present today for a pessary check. Pt stated that she cough and the pessary fell out. Pt stated that she started her cycle on the 08/31/18 through 09/04/18. No other complaints.

## 2018-09-07 NOTE — Progress Notes (Signed)
GYNECOLOGY PROGRESS NOTE  Subjective:    Patient ID: Molly [name], female    DOB: 02-Dec-1951, 67 y.o.   MRN: [id number]  HPI  Patient is a 67 y.o. postmenopausal G51P2002 female who presents for complaints of post-menopausal bleeding.  She currently has a Grade 3 uterine prolapse with Grade 1 cystocele and rectocele. Last visit the decision was made to leaver her pessary out after an episode of expulsion due to extreme coughing fits due to a bout of pneumonia.  At that time patient had also complained of PMB, however was thought to be related to recent pessary expulsion.  No abnormal findings noted at that time.  Today the patient notes that she had another episode of bleeding from 08/31/18 - 09/04/2018, which was more heavy like a full period, and associated cramping. Of note, patient has had 2 cervical polyps removed over the past year, which was also thought to be a cause of her PMB, and has a h/o of fibroids.   The following portions of the patient's history were reviewed and updated as appropriate: allergies, current medications, past family history, past medical history, past social history, past surgical history and problem list.  Review of Systems Pertinent items noted in HPI and remainder of comprehensive ROS otherwise negative.   Objective:   Blood pressure 120/73, pulse 64, height 4\' 11"  (1.499 m), weight 212 lb 11.2 oz (96.5 kg). Body mass index is 42.96 kg/m.  General appearance: alert and no distress, morbid obesity Abdomen: soft, non-tender; bowel sounds normal; no masses,  no organomegaly Pelvic: external genitalia normal, rectovaginal septum normal.  Vagina with scant mucoid discharge.  Cervix normal appearing, no lesions and no motion tenderness.  Uterus mobile, nontender, normal shape and size.  Grade 3 uterine prolapse with Grade 1 cystocele and rectocele.   Adnexae non-palpable, nontender bilaterally.  Extremities: extremities normal, atraumatic, no cyanosis or  edema Neurologic: Grossly normal   Labs:  Lab Results  Component Value Date   TSH 1.47 01/23/2017   Lab Results  Component Value Date   WBC 6.5 05/07/2018   HGB 12.5 05/07/2018   HCT 37.6 05/07/2018   MCV 89.1 05/07/2018   PLT 397 05/07/2018     Imaging:  US PELVIS TRANSVANGINAL NON-OB (TV ONLY) ULTRASOUND REPORT  Location: [facility] Date of Service:  06/01/2018  Indications: PMB Findings:  The uterus measures 10.2 x 5.5 x 5.7 cm. Echo texture is heterogeneous with evidence of focal masses. Within the uterus are multiple suspected fibroids measuring: Fibroid 1: Appears to be submucosal invading the endometrium, 2.9 x 3.3 x  3.6 cm Fibroid 2: Posterior, Intramural, 2.0 x 2.1 x 1.8 cm Fibroid 3: Anterior, Intramural, 2.9 x 3.3 x 3.6 cm The Endometrium measures 4.7 mm.  Neither ovary was visualized on transvaginal or transabdominal approach. Survey of the adnexa demonstrates no adnexal masses. There is no free fluid in the cul de sac.  Impression: 1. Fibroid uterus 2. One fibroid appears to be invading the endometrium measuring 2.9 x 3.3  x 3.6 cm  Recommendations: 1.Clinical correlation with the patient's History and Physical Exam.  Dario Ave, RDMS  I have reviewed this study and agree with documented findings.   Rubie Maid, MD [facility]    Assessment:   PMB (postmenopausal bleeding)   Intramural and submucous leiomyoma of uterus  Cystocele with small rectocele and uterine descent  Morbid Obesity  Plan:   1. Patient with persistent PMB.  Initially thought to be  due to pessary use, but now has been without the pessary for several months and still having bleeding.  Also with a history of prior cervical polyps x 2.  Exam today was negative.  Recommend endometrial biopsy as no other causes could be identified, and patient has risk factors such as obesity for malignancy and hyperplasia.  I have discussed with the patient in the  past regarding hysterectomy for management of her uterine prolapse, however will strongly encourage in light of persistent bleeding.  Patient notes that she is still not able to devote that time to her recovery period, as she is the sole caregiver for her elderly father.  If EMB negative, can consider use of a progesterone to help with episodes of PMB until patient can proceed with surgical management.  2. Fibroid uterus - should not be causing problems postmenopausally, however can be a contributing factor for endometrial bleeding due to submucosal nature and possible endometrial atrophy.  May be treatable with progestins as patient not ready to proceed with surgical management.  3. Cystocele with rectocele and uterine descent.  Cystocele and rectocele mild, no need for intervention currently.  Uterine prolapse treatable with surgery as patient continues to decline reinsertion of the pessary at this time.     A total of 15 minutes were spent face-to-face with the patient during this encounter and over half of that time dealt with counseling and coordination of care.   Endometrial Biopsy Procedure Note  The patient is positioned on the exam table in the dorsal lithotomy position. Bimanual exam confirms uterine position and size. A Graves speculum is placed into the vagina. A single toothed tenaculum is placed onto the anterior lip of the cervix. The pipette is placed into the endocervical canal and is advanced however only able to advance to ~ 5 -6 cm due to possible obstruction from fibroid. Using a piston like technique, with vacuum created by withdrawing the stylus, the endometrial specimen is obtained and transferred to the biopsy container. Minimal bleeding is encountered. The procedure is well tolerated.   Uterine Position: mid   Uterine Length: 5-6  cm   Uterine Specimen: Scant   Post procedure instructions are given. The patient is scheduled for follow up appointment.    Rubie Maid,  MD [facility]

## 2018-09-07 NOTE — Progress Notes (Signed)
Name: Molly [name]   MRN: [id number]    DOB: 06/03/1951   Date:09/07/2018       Progress Note  Subjective  Chief Complaint  Chief Complaint  Patient presents with  . Medication Refill    4 month F/U  . Asthma    "Has been really good"  . Hypertension    Edema in feet and ankles   . Congestive Heart Failure  . Insulin Resistance  . Dyslipidemia  . Osteoarthritis    HPI  Asthma: mild intermittent , last flare was 03/2018, she stopped using Breo, occasionally has a cough , wheezing or SOB  Insulin resistance: she took Victoza but caused pancreatitis and had to stop, she still has not gained the weight back, trying to eat healthy and smaller portion. No polyphagia, polydipsia or polyuria.  She is on metformin and denies side effects  AR: triggered by environment causes itchy eyes, some rhinorrhea and raspy voice, improves with otc medication  Major Depression: having a crying spells, no motivation, feels sad. Most of her emotional problem is because she has 12 living siblings and her father sick, but siblings don't seem to care. It makes her feel sad.   Cardiomyopathy without CHF: back on lasix, no orthopnea, or chest pain, lower extremity edema slightly present today, but has been staying at the hospital with her father  Post-menopausal bleeding: she is under the care of Dr. Marcelline Mates, had pessary placed but feel a couple of months ago, and over the past week had 4 days of vaginal bleeding, saw Dr. Marcelline Mates today and is getting evaluated   Morbid obesity: off victoza because of episode of pancreatitis, also has prediabetes, on metformin now. Weight is stable.   Dyslipidemia: cannot tolerate statin therapy, but is taking Zetia , LDL still elevated on her last visit and we will recheck it today   HTN: taking medication bp is towards low end of normal, no dizziness, chest pain or palpitation   OA: she can tolerate topical voltaren gel and takes Tylenol prn for pain, currently no  swelling but has intermittent left knee pain. She would like a refill of topical medication    Patient Active Problem List   Diagnosis Date Noted  . Umbilical hernia without obstruction and without gangrene   . Atherosclerosis of right coronary artery 11/10/2017  . Atherosclerosis of abdominal aorta ([place]) 11/10/2017  . Prediabetes 10/02/2017  . Pain in limb 03/31/2017  . Perennial allergic rhinitis with seasonal variation 05/14/2016  . Asthma, mild intermittent, well-controlled 05/14/2016  . Hypertension goal BP (blood pressure) < 140/90 12/10/2015  . Cardiomyopathy due to hypertension ([place]) 12/10/2015  . History of CVA (cerebrovascular accident) 12/10/2015  . Hyperlipidemia LDL goal <70 12/10/2015  . Statin intolerance 12/10/2015    Past Surgical History:  Procedure Laterality Date  . CHOLECYSTECTOMY    . COLONOSCOPY WITH PROPOFOL N/A 01/08/2017   Procedure: COLONOSCOPY WITH PROPOFOL;  Surgeon: [name], MD;  Location: [facility] ENDOSCOPY;  Service: Endoscopy;  Laterality: N/A;  . COLONOSCOPY WITH PROPOFOL N/A 05/05/2018   Procedure: COLONOSCOPY WITH PROPOFOL;  Surgeon: [name], MD;  Location: [facility] ENDOSCOPY;  Service: Gastroenterology;  Laterality: N/A;  . HERNIA REPAIR    . UMBILICAL HERNIA REPAIR N/A 11/17/2017   Procedure: HERNIA REPAIR UMBILICAL ADULT;  Surgeon: Jules Husbands, MD;  Location: [facility] ORS;  Service: General;  Laterality: N/A;    Family History  Problem Relation Age of Onset  . Stroke Mother   . Hypertension Mother   .  Dementia Mother   . Alzheimer's disease Mother   . Gout Father   . Asthma Father   . Hypertension Father   . Dementia Father   . Healthy Sister   . Stroke Brother   . Alzheimer's disease Brother   . Healthy Daughter   . Hypertension Brother   . Healthy Brother   . Cancer Paternal Grandmother   . Healthy Sister   . Healthy Sister   . Hypertension Sister   . Hypertension Sister   . Stroke Brother   . Alzheimer's disease Brother   .  Stroke Brother   . Hypertension Brother   . Stroke Brother   . Hypertension Brother   . Healthy Brother   . Healthy Brother   . Hypertension Daughter   . Breast cancer Neg Hx     Social History   Socioeconomic History  . Marital status: Divorced    Spouse name: Not on file  . Number of children: 2  . Years of education: some college  . Highest education level: 12th grade  Occupational History  . Occupation: Retired  Scientific laboratory technician  . Financial resource strain: Not hard at all  . Food insecurity:    Worry: Never true    Inability: Never true  . Transportation needs:    Medical: No    Non-medical: No  Tobacco Use  . Smoking status: Never Smoker  . Smokeless tobacco: Never Used  . Tobacco comment: smoking cessation materials not required  Substance and Sexual Activity  . Alcohol use: No    Alcohol/week: 0.0 standard drinks  . Drug use: No  . Sexual activity: Never  Lifestyle  . Physical activity:    Days per week: 0 days    Minutes per session: 0 min  . Stress: Not at all  Relationships  . Social connections:    Talks on phone: Patient refused    Gets together: Patient refused    Attends religious service: Patient refused    Active member of club or organization: Patient refused    Attends meetings of clubs or organizations: Patient refused    Relationship status: Divorced  . Intimate partner violence:    Fear of current or ex partner: No    Emotionally abused: No    Physically abused: No    Forced sexual activity: No  Other Topics Concern  . Not on file  Social History Narrative  . Not on file     Current Outpatient Medications:  .  albuterol (PROVENTIL HFA;VENTOLIN HFA) 108 (90 Base) MCG/ACT inhaler, Inhale 2 puffs into the lungs every 4 (four) hours as needed for wheezing or shortness of breath., Disp: 1 Inhaler, Rfl: 0 .  calcium carbonate 1250 MG capsule, Take 1,250 mg daily by mouth., Disp: , Rfl:  .  cetirizine (ZYRTEC) 10 MG tablet, Take 1 tablet  (10 mg total) by mouth daily as needed., Disp: 90 tablet, Rfl: 1 .  diclofenac sodium (VOLTAREN) 1 % GEL, Apply 2 g topically 3 (three) times daily as needed (pain)., Disp: 100 g, Rfl: 1 .  ezetimibe (ZETIA) 10 MG tablet, Take 1 tablet (10 mg total) by mouth daily., Disp: 90 tablet, Rfl: 1 .  fluticasone furoate-vilanterol (BREO ELLIPTA) 100-25 MCG/INH AEPB, Inhale 1 puff into the lungs daily., Disp: 60 each, Rfl: 0 .  furosemide (LASIX) 40 MG tablet, Take 1 tablet (40 mg total) by mouth daily., Disp: 90 tablet, Rfl: 1 .  metFORMIN (GLUCOPHAGE-XR) 750 MG 24 hr tablet, Take 1  tablet (750 mg total) by mouth daily with breakfast., Disp: 90 tablet, Rfl: 1 .  Multiple Vitamin (MULTIVITAMIN WITH MINERALS) TABS tablet, Take 1 tablet 4 (four) times a week by mouth., Disp: , Rfl:  .  olmesartan-hydrochlorothiazide (BENICAR HCT) 40-12.5 MG tablet, Take 1 tablet by mouth daily., Disp: 90 tablet, Rfl: 1 .  omeprazole (PRILOSEC) 20 MG capsule, Take 20 mg daily by mouth., Disp: , Rfl:  .  Triamcinolone Acetonide (NASACORT ALLERGY 24HR NA), Place 1 spray daily as needed into the nose (allergies)., Disp: , Rfl:  .  ketoconazole (NIZORAL) 2 % cream, Apply 1 application topically daily as needed for irritation. On abdominal fold prn, Disp: 60 g, Rfl: 0  Allergies  Allergen Reactions  . Nsaids Hives and Rash  . Statins Other (See Comments)    Joint pains  . Victoza [Liraglutide] Other (See Comments)    pancreatitis  . Crestor [Rosuvastatin Calcium] Rash     ROS  Constitutional: Negative for fever or weight change.  Respiratory: Negative for cough and shortness of breath.   Cardiovascular: Negative for chest pain or palpitations.  Gastrointestinal: Negative for abdominal pain, no bowel changes.  Musculoskeletal: Negative for gait problem or joint swelling.  Skin: Positive for rash.  Neurological: Negative for dizziness or headache.  No other specific complaints in a complete review of systems (except as  listed in HPI above).   Objective  Vitals:   09/07/18 1541  BP: 110/64  Pulse: 69  Resp: 16  Temp: 98.1 F (36.7 C)  TempSrc: Oral  SpO2: 98%  Weight: 212 lb 1.6 oz (96.2 kg)  Height: 4\' 11"  (1.499 m)    Body mass index is 42.84 kg/m.  Physical Exam  Constitutional: Patient appears well-developed and well-nourished. Obese No distress.  HEENT: head atraumatic, normocephalic, pupils equal and reactive to light,  neck supple, throat within normal limits Cardiovascular: Normal rate, regular rhythm and normal heart sounds.  No murmur heard. Trace BLE edema. Pulmonary/Chest: Effort normal and breath sounds normal. No respiratory distress. Abdominal: Soft.  There is no tenderness. Psychiatric: Patient has a normal mood and affect. behavior is normal. Judgment and thought content normal.   PHQ2/9: Depression screen [facility] 2/9 09/07/2018 05/07/2018 04/06/2018 11/10/2017 10/02/2017  Decreased Interest 1 1 0 0 0  Down, Depressed, Hopeless 1 0 0 0 0  PHQ - 2 Score 2 1 0 0 0  Altered sleeping 1 0 0 - -  Tired, decreased energy 2 2 0 - -  Change in appetite 2 0 0 - -  Feeling bad or failure about yourself  0 0 0 - -  Trouble concentrating 1 1 0 - -  Moving slowly or fidgety/restless 0 0 0 - -  Suicidal thoughts 0 0 0 - -  PHQ-9 Score 8 4 0 - -  Difficult doing work/chores Somewhat difficult Not difficult at all Not difficult at all - -     Fall Risk: Fall Risk  09/07/2018 05/07/2018 04/06/2018 11/10/2017 10/02/2017  Falls in the past year? No No No No No  Risk for fall due to : - - Impaired vision;Medication side effect - -  Risk for fall due to: Comment - - blurred vision, wears eyeglasses - -     Functional Status Survey: Is the patient deaf or have difficulty hearing?: No Does the patient have difficulty seeing, even when wearing glasses/contacts?: Yes(glasses) Does the patient have difficulty concentrating, remembering, or making decisions?: Yes(remember-has a lot on her  plate) Does the  patient have difficulty walking or climbing stairs?: No Does the patient have difficulty dressing or bathing?: No Does the patient have difficulty doing errands alone such as visiting a doctor's office or shopping?: No   Assessment & Plan  1. Hypertension goal BP (blood pressure) < 140/90  - COMPLETE METABOLIC PANEL WITH GFR - furosemide (LASIX) 40 MG tablet; Take 1 tablet (40 mg total) by mouth daily.  Dispense: 90 tablet; Refill: 1 - olmesartan-hydrochlorothiazide (BENICAR HCT) 20-12.5 MG tablet; Take 1 tablet by mouth daily.  Dispense: 90 tablet; Refill: 1  2. Need for immunization against influenza  - Flu vaccine HIGH DOSE PF (Fluzone High dose)  3. Mild persistent asthma without complication  Only using Breo prn now  4. Morbid obesity ([place])  Weight is stable, discussed portion control   5. Atherosclerosis of abdominal aorta (HCC)  - ezetimibe (ZETIA) 10 MG tablet; Take 1 tablet (10 mg total) by mouth daily.  Dispense: 90 tablet; Refill: 1  6. Pure hypercholesterolemia  - Lipid panel  7. Prediabetes  - hgbA1C   8. Statin intolerance   9. Cardiomyopathy due to hypertension, without heart failure (HCC)  - furosemide (LASIX) 40 MG tablet; Take 1 tablet (40 mg total) by mouth daily.  Dispense: 90 tablet; Refill: 1  10. Hyperglycemia  - Hemoglobin A1c  11. Hyperlipidemia LDL goal <70  - ezetimibe (ZETIA) 10 MG tablet; Take 1 tablet (10 mg total) by mouth daily.  Dispense: 90 tablet; Refill: 1  12. Atherosclerosis of abdominal aorta (HCC)  - ezetimibe (ZETIA) 10 MG tablet; Take 1 tablet (10 mg total) by mouth daily.  Dispense: 90 tablet; Refill: 1  13. Primary osteoarthritis of both knees  - diclofenac sodium (VOLTAREN) 1 % GEL; Apply 2 g topically 3 (three) times daily as needed (pain).  Dispense: 100 g; Refill: 1  14. Cardiomyopathy due to hypertension, without heart failure (HCC)  - furosemide (LASIX) 40 MG tablet; Take 1 tablet (40 mg  total) by mouth daily.  Dispense: 90 tablet; Refill: 1  15. Bilateral lower extremity edema  - furosemide (LASIX) 40 MG tablet; Take 1 tablet (40 mg total) by mouth daily.  Dispense: 90 tablet; Refill: 1  16. Insulin resistance  - metFORMIN (GLUCOPHAGE-XR) 750 MG 24 hr tablet; Take 1 tablet (750 mg total) by mouth daily with breakfast.  Dispense: 90 tablet; Refill: 1  17. Pruritic intertrigo  - ketoconazole (NIZORAL) 2 % cream; Apply 1 application topically daily as needed for irritation. On abdominal fold prn  Dispense: 60 g; Refill: 0  18. Postmenopausal bleeding  Under the care of Dr. Marcelline Mates and had biopsy today   19. Mild recurrent major depression (HCC)  - escitalopram (LEXAPRO) 10 MG tablet; Take 1 tablet (10 mg total) by mouth daily.  Dispense: 30 tablet; Refill: 1

## 2018-09-08 LAB — COMPLETE METABOLIC PANEL WITH GFR
AG RATIO: 1.5 (calc) (ref 1.0–2.5)
ALBUMIN MSPROF: 4.3 g/dL (ref 3.6–5.1)
ALT: 13 U/L (ref 6–29)
AST: 17 U/L (ref 10–35)
Alkaline phosphatase (APISO): 86 U/L (ref 33–130)
BUN: 16 mg/dL (ref 7–25)
CO2: 29 mmol/L (ref 20–32)
CREATININE: 0.96 mg/dL (ref 0.50–0.99)
Calcium: 9.6 mg/dL (ref 8.6–10.4)
Chloride: 102 mmol/L (ref 98–110)
GFR, EST AFRICAN AMERICAN: 71 mL/min/{1.73_m2} (ref >=60)
GFR, EST NON AFRICAN AMERICAN: 62 mL/min/{1.73_m2} (ref >=60)
GLOBULIN: 2.8 g/dL (ref 1.9–3.7)
Glucose, Bld: 86 mg/dL (ref 65–139)
POTASSIUM: 3.8 mmol/L (ref 3.5–5.3)
SODIUM: 140 mmol/L (ref 135–146)
Total Bilirubin: 0.5 mg/dL (ref 0.2–1.2)
Total Protein: 7.1 g/dL (ref 6.1–8.1)

## 2018-09-08 LAB — LIPID PANEL
CHOL/HDL RATIO: 3.5 (calc) (ref <5.0)
Cholesterol: 215 mg/dL — ABNORMAL HIGH (ref <200)
HDL: 61 mg/dL (ref >50)
LDL CHOLESTEROL (CALC): 123 mg/dL — AB
NON-HDL CHOLESTEROL (CALC): 154 mg/dL — AB (ref <130)
TRIGLYCERIDES: 193 mg/dL — AB (ref <150)

## 2018-09-08 LAB — HEMOGLOBIN A1C
Hgb A1c MFr Bld: 6.1 % of total Hgb — ABNORMAL HIGH (ref <5.7)
Mean Plasma Glucose: 128 (calc)
eAG (mmol/L): 7.1 (calc)

## 2018-09-09 ENCOUNTER — Telehealth: Payer: Self-pay

## 2018-09-09 NOTE — Telephone Encounter (Signed)
-----   Message from Rubie Maid, MD sent at 09/08/2018  6:38 PM EDT ----- Please have the patient scheduled for Friday to come in and discuss her biopsy results.

## 2018-09-09 NOTE — Telephone Encounter (Signed)
Contacted pt unable to leave a message due the line being busy.

## 2018-09-09 NOTE — Telephone Encounter (Signed)
Pt called no answer LM via voicemail to call the office to make an appt with [facility] for test results on Friday.

## 2018-09-10 ENCOUNTER — Ambulatory Visit (INDEPENDENT_AMBULATORY_CARE_PROVIDER_SITE_OTHER): Payer: Medicare Other | Admitting: Obstetrics and Gynecology

## 2018-09-10 ENCOUNTER — Encounter: Payer: Self-pay | Admitting: Obstetrics and Gynecology

## 2018-09-10 VITALS — BP 118/79 | HR 58 | Ht 59.0 in | Wt 211.0 lb

## 2018-09-10 DIAGNOSIS — C541 Malignant neoplasm of endometrium: Secondary | ICD-10-CM | POA: Diagnosis not present

## 2018-09-10 DIAGNOSIS — N950 Postmenopausal bleeding: Secondary | ICD-10-CM

## 2018-09-10 NOTE — Patient Instructions (Signed)
Uterine Cancer Uterine cancer is an abnormal growth of cancer tissue (malignant tumor) in the uterus. Unlike noncancerous (benign) tumors, malignant tumors can spread to other parts of the body. Uterine cancer usually occurs after menopause. However, it may also occur around the time that menopause begins. The wall of the uterus has an inner layer of tissue (endometrium) and an outer layer of muscle tissue (myometrium). The most common type of uterine cancer begins in the endometrium (endometrial cancer). Cancer that begins in the myometrium (uterine sarcoma) is very rare. What are the causes? The exact cause of this condition is not known. What increases the risk? You are more likely to develop this condition if you:  Are older than 50.  Have an enlarged endometrium (endometrial hyperplasia).  Use hormone therapy.  Are severely overweight (obese).  Use the medicine tamoxifen.  You are white (Caucasian).  Cannot bear children (are infertile).  Have never been pregnant.  Started menstruating at an age younger than 12 years.  Are older than 52 and are still having menstrual periods.  Have a history of cancer of the ovaries, intestines, or colon or rectum (colorectal cancer).  Have a history of enlarged ovaries with small cysts (polycystic ovarian syndrome).  Have a family history of: ? Uterine cancer. ? Hereditary nonpolyposis colon cancer (HNPCC).  Have diabetes, high blood pressure, thyroid disease, or gallbladder disease.  Use long-term, high-dose birth control pills.  Have been exposed to radiation.  Smoke.  What are the signs or symptoms? Symptoms of this condition include:  Abnormal vaginal bleeding or discharge. Bleeding may start as a watery, blood-streaked flow that gradually contains more blood. This is the most common symptom. If you experience abnormal vaginal bleeding, do not assume that it is part of menopause.  Vaginal bleeding after  menopause.  Unexplained weight loss.  Bleeding between periods.  Urination that is difficult, painful, or more frequent than usual.  A lump (mass) in the vagina.  Pain, bloating, or fullness in the abdomen.  Pain in the pelvic area.  Pain during sex.  How is this diagnosed? This condition may be diagnosed based on:  Your medical history and your symptoms.  A physical and pelvic exam. Your health care provider will feel your pelvis for any growths or enlarged lymph nodes.  Blood and urine tests.  Imaging tests, such as X-rays, CT scans, ultrasound, or MRIs.  A procedure in which a thin, flexible tube with a light and camera on the end is inserted through the vagina and used to look inside the uterus (hysteroscopy).  A Pap test to check for abnormal cells in the lower part of the uterus (cervix) and the upper vagina.  Removing a tissue sample (biopsy) from the uterine lining to check for cancer cells.  Dilation and curettage (D&C). This is a procedure that involves stretching (dilation) the cervix and scraping (curettage) the inside lining of the uterus to get a biopsy and check for cancer cells.  Your cancer will be staged to determine its severity and extent. Staging is an assessment of:  The size of the tumor.  Whether the cancer has spread.  Where the cancer has spread.  The stages of uterine cancer are as follows:  Stage I. The cancer is only found in the uterus.  Stage II. The cancer has spread to the cervix.  Stage III. The cancer has spread outside the uterus, but not outside the pelvis. The cancer may have spread to the lymph nodes in the pelvis.  Lymph nodes are part of your body's disease-fighting (immune) system. Lymph nodes are found in many locations in your body, including the neck, underarm, and groin.  Stage IV. The cancer has spread to other parts of the body, such as the bladder or rectum.  How is this treated? This condition is often treated with  surgery to remove:  The uterus, cervix, fallopian tubes, and ovaries (total hysterectomy).  The uterus and cervix (simple hysterectomy).  The type of hysterectomy you will have depends on the extent of your cancer. Lymph nodes near the uterus may also be removed in some cases. Treatment may also include one or more of the following:  Chemotherapy. This uses medicines to kill the cancer cells and prevent their spread.  Radiation therapy. This uses high-energy rays to kill the cancer cells and prevent the spread of cancer.  Chemoradiation. This is a combination treatment that alternates chemotherapy with radiation treatments to enhance the way radiation works.  Brachytherapy. This involves placing radioactive materials inside the body where the cancer was removed.  Hormone therapy. This includes taking medicines that lower the levels of estrogen in the body.  Follow these instructions at home: Activity  Return to your normal activities as told by your health care provider. Ask your health care provider what activities are safe for you.  Exercise regularly as told by your health care provider.  Do not drive or use heavy machinery while taking prescription pain medicine. General instructions  Take over-the-counter and prescription medicines only as told by your health care provider.  Maintain a healthy diet.  Work with your health care provider to: ? Manage any long-term (chronic) conditions you have, such as diabetes, high blood pressure, thyroid disease, or gallbladder disease. ? Manage any side effects of your treatment.  Do not use any products that contain nicotine or tobacco, such as cigarettes and e-cigarettes. If you need help quitting, ask your health care provider.  Consider joining a support group to help you cope with stress. Your health care provider may be able to recommend a local or online support group.  Keep all follow-up visits as told by your health care  provider. This is important. Where to find more information:  American Cancer Society: https://www.cancer.Miguel Barrera ([place]): https://www.cancer.gov Contact a health care provider if:  You have pain in your pelvis or abdomen that gets worse.  You cannot urinate.  You have abnormal bleeding.  You have a fever. Get help right away if:  You develop sudden or new severe symptoms, such as: ? Heavy bleeding. ? Severe weakness. ? Pain that is severe or does not get better with medicine. Summary  Uterine cancer is an abnormal growth of cancer tissue (malignant tumor) in the uterus. The most common type of uterine cancer begins in the endometrium (endometrial cancer).  This condition is often treated with surgery to remove the uterus, cervix, fallopian tubes, and ovaries (total hysterectomy) or the uterus and cervix (simple hysterectomy).  Work with your health care provider to manage any long-term (chronic) conditions you have, such as diabetes, high blood pressure, thyroid disease, or gallbladder disease.  Consider joining a support group to help you cope with stress. Your health care provider may be able to recommend a local or online support group. This information is not intended to replace advice given to you by your health care provider. Make sure you discuss any questions you have with your health care provider. Document Released: 12/15/2005 Document Revised: 12/12/2016 Document  Reviewed: 12/12/2016 Elsevier Interactive Patient Education  2018 Elsevier Inc.  

## 2018-09-10 NOTE — Progress Notes (Signed)
    GYNECOLOGY PROGRESS NOTE  Subjective:    Patient ID: Molly [name], female    DOB: [birth year], 67 y.o.   MRN: [id number]  HPI  Patient is a 67 y.o. G50P2002 female who presents for discussion of endometrial biopsy results. She had an endometrial biopsy performed for persistent PMB.  She also has Grade 3 uterine prolapse with Grade 1 cystocele and rectocele, and fibroid uterus. Has had 2 cervical polyps removed in the past 6-8 months. Patient denies major complaints today.   The following portions of the patient's history were reviewed and updated as appropriate: allergies, current medications, past family history, past medical history, past social history, past surgical history and problem list.  Review of Systems Pertinent items noted in HPI and remainder of comprehensive ROS otherwise negative.   Objective:   Blood pressure 118/79, pulse (!) 58, height 4\' 11"  (1.499 m), weight 211 lb (95.7 kg). General appearance: alert and no distress Remainder of exam deferred.    Pathology:   Endometrium, biopsy, Uterus - ENDOMETRIOID ADENOCARCINOMA. - SEE COMMENT.  Microscopic Comment The adenocarcinoma appears FIGO grade 1. Dr. Vicente Males has reviewed the case and concurs with this interpretation. Additional studies can be performed upon clinician request. Dr. Marcelline Mates was paged on 09/08/18. (JBK:gt, 09/08/18) Enid Cutter MD   Imaging:  US PELVIS TRANSVANGINAL NON-OB (TV ONLY) ULTRASOUND REPORT  Location: [facility] Date of Service:  06/01/2018  Indications: PMB Findings:  The uterus measures 10.2 x 5.5 x 5.7 cm. Echo texture is heterogeneous with evidence of focal masses. Within the uterus are multiple suspected fibroids measuring: Fibroid 1: Appears to be submucosal invading the endometrium, 2.9 x 3.3 x  3.6 cm Fibroid 2: Posterior, Intramural, 2.0 x 2.1 x 1.8 cm Fibroid 3: Anterior, Intramural, 2.9 x 3.3 x 3.6 cm The Endometrium measures 4.7 mm.  Neither ovary  was visualized on transvaginal or transabdominal approach. Survey of the adnexa demonstrates no adnexal masses. There is no free fluid in the cul de sac.  Impression: 1. Fibroid uterus 2. One fibroid appears to be invading the endometrium measuring 2.9 x 3.3  x 3.6 cm  Recommendations: 1.Clinical correlation with the patient's History and Physical Exam.  Dario Ave, RDMS  I have reviewed this study and agree with documented findings.   Rubie Maid, MD [facility]   Assessment:   Postmenopausal bleeding Fibrod uterus Pelvic organ prolapse  Plan:   Patient with postmenopausal bleeding, newly diagnosed endometrial cancer.  Discussion had with patient regarding need for GYN Oncology consultation. Discussed that the recommendation at this time would likely be hysterectomy (although route to be determined.  Patient with good descensus due to prolapse). Advised that with regards to the type of cancer that she has, the prognosis is usually good, and often times only confined just to the uterus.  Advised that further workup will be done when she sees the Oncologist. 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  She notes that now she is willing to undergo procedure if necessary for management.  All questions answered.  Patient notes understanding.    A total of 15 minutes were spent face-to-face with the patient during this encounter and over half of that time dealt with counseling and coordination of care.   Rubie Maid, MD [facility]

## 2018-09-10 NOTE — Progress Notes (Signed)
Pt is present today for test results. No problems.

## 2018-09-13 LAB — CYTOLOGY - PAP

## 2018-09-15 ENCOUNTER — Encounter: Payer: Self-pay | Admitting: *Deleted

## 2018-09-15 ENCOUNTER — Inpatient Hospital Stay: Payer: Medicare Other | Attending: Obstetrics and Gynecology | Admitting: Obstetrics and Gynecology

## 2018-09-15 ENCOUNTER — Inpatient Hospital Stay: Payer: Medicare Other

## 2018-09-15 VITALS — BP 131/78 | HR 55 | Temp 98.0°F | Ht 59.0 in | Wt 211.4 lb

## 2018-09-15 DIAGNOSIS — Z01811 Encounter for preprocedural respiratory examination: Secondary | ICD-10-CM

## 2018-09-15 DIAGNOSIS — Z90710 Acquired absence of both cervix and uterus: Secondary | ICD-10-CM | POA: Diagnosis not present

## 2018-09-15 DIAGNOSIS — C541 Malignant neoplasm of endometrium: Secondary | ICD-10-CM

## 2018-09-15 DIAGNOSIS — N814 Uterovaginal prolapse, unspecified: Secondary | ICD-10-CM | POA: Diagnosis not present

## 2018-09-15 NOTE — Addendum Note (Signed)
Addended by: Mellody Drown on: 09/15/2018 03:25 PM   Modules accepted: Orders

## 2018-09-15 NOTE — Addendum Note (Signed)
Addended by: Mellody Drown on: 09/15/2018 04:21 PM   Modules accepted: Orders

## 2018-09-15 NOTE — Progress Notes (Addendum)
Gynecologic Oncology Consult Visit   Referring Provider: Dr Marcelline Mates  Chief Complaint: grade 1 endometrial cancer  Subjective:  Molly [name] is a 66 y.o. female who is seen in consultation from Dr. Marcelline Mates for endometrial adenocarcinoma.   She has history of Grade 3 uterine prolapse with Grade 1 cystocele and rectocele. She noted pessary had become dislodged after coughing d/t pneumonia and presented to Dr. Marcelline Mates for evaluation 05/2018. At that time she noted post-menopausal bleeding and cramping and has history of cervical polyps. Bleeding was felt to be possibly related to pessary.   Ultrasound revealed: uterus measuring 10.2 x 5.5 x 5.7 cm, heterogenous with evidence of fibroids invading endometrium measuring 2.9 x 3.3 x 3.6 cm and two additional fibroids measuring 2.0 x 2.1 x 1.8, and 2.9 x 3.3 x 3.6. Endometrium measuring 4.7 mm. Neither ovary was visualized.   She had second episode of bleeding 08/31/18-09/04/18, felt to be heavier and more 'period like'. Endometrial biopsy was performed.  Pathology: - Endometrioid adenocarcinoma, figo grade 1.   Today, she reports that bleeding has stopped. She has mild swelling of her bilateral lower extremities which is chronic and no worse. Bowel movements are regular, no distention or bloating. Appetite is normal. 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    Problem List: Patient Active Problem List   Diagnosis Date Noted  . Umbilical hernia without obstruction and without gangrene   . Atherosclerosis of right coronary artery 11/10/2017  . Atherosclerosis of abdominal aorta ([place]) 11/10/2017  . Prediabetes 10/02/2017  . Pain in limb 03/31/2017  . Perennial allergic rhinitis with seasonal variation 05/14/2016  . Asthma, mild intermittent, well-controlled 05/14/2016  . Hypertension goal BP (blood pressure) < 140/90 12/10/2015  . Cardiomyopathy due to hypertension ([place]) 12/10/2015  . History of CVA (cerebrovascular accident) 12/10/2015  .  Hyperlipidemia LDL goal <70 12/10/2015  . Statin intolerance 12/10/2015    Past Medical History: Past Medical History:  Diagnosis Date  . Asthma    history of asthma  . Endometrial cancer ([place])   . GERD (gastroesophageal reflux disease)    takes prilosec prn  . History of CVA (cerebrovascular accident) 12/10/2015  . Hyperlipidemia LDL goal <70 12/10/2015  . Hypertension   . Pre-diabetes   . Prediabetes 10/02/2017   A1c 6 in January 2018  . Stroke [facility]) 1990    no residual effects    Past Surgical History: Past Surgical History:  Procedure Laterality Date  . CHOLECYSTECTOMY    . COLONOSCOPY WITH PROPOFOL N/A 01/08/2017   Procedure: COLONOSCOPY WITH PROPOFOL;  Surgeon: [name], MD;  Location: [facility] ENDOSCOPY;  Service: Endoscopy;  Laterality: N/A;  . COLONOSCOPY WITH PROPOFOL N/A 05/05/2018   Procedure: COLONOSCOPY WITH PROPOFOL;  Surgeon: [name], MD;  Location: [facility] ENDOSCOPY;  Service: Gastroenterology;  Laterality: N/A;  . HERNIA REPAIR    . UMBILICAL HERNIA REPAIR N/A 11/17/2017   Procedure: HERNIA REPAIR UMBILICAL ADULT;  Surgeon: Jules Husbands, MD;  Location: [facility] ORS;  Service: General;  Laterality: N/A;    Past Gynecologic History:  [id number] Denies abnormal pap smears.  History of OCP use.  Denies STD history   OB History:  OB History  Gravida Para Term Preterm AB Living  2 2 2     2   SAB TAB Ectopic Multiple Live Births          2    # Outcome Date GA Lbr Len/2nd Weight Sex Delivery Anes PTL Lv  2 Term 14  F Vag-Spont   LIV  1 Term 36    F Vag-Spont   LIV    Family History: Family History  Problem Relation Age of Onset  . Stroke Mother   . Hypertension Mother   . Dementia Mother   . Alzheimer's disease Mother   . Gout Father   . Asthma Father   . Hypertension Father   . Dementia Father   . Healthy Sister   . Stroke Brother   . Alzheimer's disease Brother   . Healthy Daughter   . Hypertension Brother   . Healthy Brother   . Cancer  Paternal Grandmother   . Healthy Sister   . Healthy Sister   . Hypertension Sister   . Hypertension Sister   . Stroke Brother   . Alzheimer's disease Brother   . Stroke Brother   . Hypertension Brother   . Stroke Brother   . Hypertension Brother   . Healthy Brother   . Healthy Brother   . Hypertension Daughter   . Breast cancer Neg Hx     Social History: Social History   Socioeconomic History  . Marital status: Divorced    Spouse name: Not on file  . Number of children: 2  . Years of education: some college  . Highest education level: 12th grade  Occupational History  . Occupation: Retired  Scientific laboratory technician  . Financial resource strain: Not hard at all  . Food insecurity:    Worry: Never true    Inability: Never true  . Transportation needs:    Medical: No    Non-medical: No  Tobacco Use  . Smoking status: Never Smoker  . Smokeless tobacco: Never Used  . Tobacco comment: smoking cessation materials not required  Substance and Sexual Activity  . Alcohol use: No    Alcohol/week: 0.0 standard drinks  . Drug use: No  . Sexual activity: Never  Lifestyle  . Physical activity:    Days per week: 0 days    Minutes per session: 0 min  . Stress: Not at all  Relationships  . Social connections:    Talks on phone: Patient refused    Gets together: Patient refused    Attends religious service: Patient refused    Active member of club or organization: Patient refused    Attends meetings of clubs or organizations: Patient refused    Relationship status: Divorced  . Intimate partner violence:    Fear of current or ex partner: No    Emotionally abused: No    Physically abused: No    Forced sexual activity: No  Other Topics Concern  . Not on file  Social History Narrative  . Not on file    Allergies: Allergies  Allergen Reactions  . Nsaids Hives and Rash  . Statins Other (See Comments)    Joint pains  . Victoza [Liraglutide] Other (See Comments)    pancreatitis   . Crestor [Rosuvastatin Calcium] Rash    Current Medications: Current Outpatient Medications  Medication Sig Dispense Refill  . albuterol (PROVENTIL HFA;VENTOLIN HFA) 108 (90 Base) MCG/ACT inhaler Inhale 2 puffs into the lungs every 4 (four) hours as needed for wheezing or shortness of breath. 1 Inhaler 0  . cetirizine (ZYRTEC) 10 MG tablet Take 1 tablet (10 mg total) by mouth daily as needed. 90 tablet 1  . diclofenac sodium (VOLTAREN) 1 % GEL Apply 2 g topically 3 (three) times daily as needed (pain). 100 g 1  . escitalopram (LEXAPRO) 10 MG  tablet Take 1 tablet (10 mg total) by mouth daily. 30 tablet 1  . ezetimibe (ZETIA) 10 MG tablet Take 1 tablet (10 mg total) by mouth daily. 90 tablet 1  . fluticasone furoate-vilanterol (BREO ELLIPTA) 100-25 MCG/INH AEPB Inhale 1 puff into the lungs daily. 60 each 0  . furosemide (LASIX) 40 MG tablet Take 1 tablet (40 mg total) by mouth daily. 90 tablet 1  . ketoconazole (NIZORAL) 2 % cream Apply 1 application topically daily as needed for irritation. On abdominal fold prn 60 g 0  . metFORMIN (GLUCOPHAGE-XR) 750 MG 24 hr tablet Take 1 tablet (750 mg total) by mouth daily with breakfast. 90 tablet 1  . Multiple Vitamin (MULTIVITAMIN WITH MINERALS) TABS tablet Take 1 tablet 4 (four) times a week by mouth.    . olmesartan-hydrochlorothiazide (BENICAR HCT) 20-12.5 MG tablet Take 1 tablet by mouth daily. 90 tablet 1  . omeprazole (PRILOSEC) 20 MG capsule Take 20 mg daily by mouth.    . Triamcinolone Acetonide (NASACORT ALLERGY 24HR NA) Place 1 spray daily as needed into the nose (allergies).    . calcium carbonate 1250 MG capsule Take 1,250 mg daily by mouth.     No current facility-administered medications for this visit.     Review of Systems General: negative for fevers, chills, fatigue, changes in sleep, changes in weight or appetite Skin: negative for changes in color, texture, moles or lesions Eyes: negative for changes in vision, pain,  diplopia HEENT: negative for change in hearing, pain, discharge, tinnitus, vertigo, voice changes, sore throat, neck masses Breasts: negative for breast lumps Pulmonary: negative for dyspnea, orthopnea, productive cough Cardiac: negative for palpitations, syncope, pain, discomfort, pressure Gastrointestinal: negative for dysphagia, nausea, vomiting, jaundice, pain, constipation, diarrhea, hematemesis, hematochezia Genitourinary/Sexual: negative for dysuria, discharge, hesitancy, nocturia, retention, stones, infections, STD's, incontinence Ob/Gyn: negative for pain Musculoskeletal: negative for pain, stiffness, swelling, range of motion limitation Hematology: negative for easy bruising, bleeding Neurologic/Psych: negative for headaches, seizures, paralysis, weakness, tremor, change in gait, change in sensation, mood swings, depression, anxiety, change in memory  Objective:  Physical Examination:  BP 131/78 (BP Location: Left Arm, Patient Position: Sitting)   Pulse (!) 55   Temp 98 F (36.7 C) (Oral)   Ht 4\' 11"  (1.499 m)   Wt 211 lb 6.4 oz (95.9 kg)   BMI 42.70 kg/m     ECOG Performance Status: 0 - Asymptomatic  GENERAL: Patient is a well appearing female in no acute distress HEENT:  PERRL, neck supple with midline trachea. Thyroid without masses.  NODES:  No cervical, supraclavicular, axillary, or inguinal lymphadenopathy palpated.  LUNGS:  Clear to auscultation bilaterally.  No wheezes or rhonchi. HEART:  Regular rate and rhythm. No murmur appreciated. ABDOMEN:  Soft, nontender.  Positive, normoactive bowel sounds.  MSK:  No focal spinal tenderness to palpation. Full range of motion bilaterally in the upper extremities. EXTREMITIES:  No peripheral edema.   SKIN:  Clear with no obvious rashes or skin changes. No nail dyscrasia. NEURO:  Nonfocal. Well oriented.  Appropriate affect.  Pelvic: EGBUS: no lesions Cervix: no lesions, nontender, mobile Vagina: no lesions, no discharge  or bleeding. Mild cystocele, no rectocele. Uterus: normal size, nontender, mobile Adnexa: no palpable masses Rectovaginal: confirmatory    Assessment:  Molly [name] is a 67 y.o. female diagnosed with grade 1 endometrial cancer.  History of uterine prolapse and has used a pessary.  No clinically significant cystocele or rectocele or SUI.  Also has small fibroids.  Had umbilical hernia repair last year without mesh.   Good performance status presently despite co-morbidities below.  Medical co-morbidities complicating care: Morbid obesity, CVA 1990 with no residual and no medication, HTN with good BP today.    Plan:   Problem List Items Addressed This Visit    None    Visit Diagnoses    Endometrial adenocarcinoma ([place])    -  Primary     We discussed options for management including surgery. We recommend total laparoscopic hysterectomy, bilateral salpingoophorectomy with sentinel lymph node mapping and biopsies, possible X lap, possible pelvic/aortic node dissection if more extensive disease found,  and surgery scheduled for 10/06/18.   The risks of surgery were discussed in detail and she understands these to include infection; wound separation; hernia; vaginal cuff separation, injury to adjacent organs such as bowel, bladder, blood vessels, ureters and nerves; bleeding which may require blood transfusion; anesthesia risk; thromboembolic events; possible death; unforeseen complications; possible need for re-exploration; medical complications such as heart attack, stroke, pleural effusion and pneumonia; and, if staging performed the risk of lymphedema and lymphocyst.  The patient will receive DVT and antibiotic prophylaxis as indicated.  She voiced a clear understanding.  She had the opportunity to ask questions and written informed consent was obtained today.  The patient's diagnosis, an outline of the further diagnostic and laboratory studies which will be required, the recommendation for  surgery, and alternatives were discussed with her and her accompanying family members.  All questions were answered to their satisfaction.  A total of 60 minutes were spent with the patient/family today; 40% was spent in education, counseling and coordination of care for uterine cancer.    Mellody Drown, MD  CC:  Rubie Maid, [address] [place], [place] [address] [phone]

## 2018-09-15 NOTE — Progress Notes (Signed)
Ms Denio has had her pre - opt teaching in office today and looking forward for surgery on 10/06/18. Waiting on a responds from Dr Marcelline Mates

## 2018-09-15 NOTE — Patient Instructions (Signed)
DIVISION OF GYNECOLOGIC ONCOLOGY BOWEL PREP   The following instructions are extremely important to prepare for your surgery. Please follow them carefully   Step 1: Liquid Diet Instructions              Clear Liquid Diet for GYN Oncology Patients Day Before Surgery The day before your scheduled surgery DO NOT EAT any solid foods.  We do want you to drink enough liquids, but NO MILK products.  We do not want you to be dehydrated.  Clear liquids are defined as no milk products and no pieces of any solid food. Drink at least 64 oz. of fluid.  The following are all approved for you to drink the day before you surgery.  Chicken, Beef or Vegetable Broth (bouillon or consomm) - NO BROTH AFTER MIDNIGHT  Plain Jello  (no fruit)  Water  Strained lemonade or fruit punch  Gatorade (any flavor)  CLEAR Ensure or Boost Breeze  Fruit juices without pulp, such as apple, grape, or cranberry juice  Clear sodas - NO SODA AFTER MIDNIGHT  Ice Pops without bits of fruit or fruit pulp  Honey  Tea or coffee without milk or cream                 Any foods not on the above list should be avoided                                                                                             Step 2: Laxatives           The evening before surgery:   Time: around 5pm   Follow these instructions carefully.   Administer 1 Dulcolax suppository according to manufacturer instructions on the box. You will need to purchase this laxative at a pharmacy or grocery store.    Individual responses to laxatives vary; this prep may cause multiple bowel movements. It often works in 30 minutes and may take as long as 3 hours. Stay near an available bathroom.    It is important to stay hydrated. Ensure you are still drinking clear liquids.     IMPORTANT: FOR YOUR SAFETY, WE WILL HAVE TO CANCEL YOUR SURGERY IF YOU DO NOT FOLLOW THESE INSTRUCTIONS.    Do not eat anything after midnight (including gum or  candy) prior to your surgery.  Avoid drinking carbonated beverages after midnight.  You can have clear liquids up until one hour before you arrive at the hospital. "Nothing by mouth" means no liquids, gum, candy, etc for one hour before your arrival time.      Laparoscopy Laparoscopy is a procedure to diagnose diseases in the abdomen. During the procedure, a thin, lighted, pencil-sized instrument called a laparoscope is inserted into the abdomen through an incision. The laparoscope allows your health care provider to look at the organs inside your body. LET YOUR HEALTH CARE PROVIDER KNOW ABOUT:  Any allergies you have.  All medicines you are taking, including vitamins, herbs, eye drops, creams, and over-the-counter medicines.  Previous problems you or members of your family have had with the use of anesthetics.  Any blood   disorders you have.  Previous surgeries you have had.  Medical conditions you have. RISKS AND COMPLICATIONS  Generally, this is a safe procedure. However, problems can occur, which may include:  Infection.  Bleeding.  Damage to other organs.  Allergic reaction to the anesthetics used during the procedure. BEFORE THE PROCEDURE  Do not eat or drink anything after midnight on the night before the procedure or as directed by your health care provider.  Ask your health care provider about: ? Changing or stopping your regular medicines. ? Taking medicines such as aspirin and ibuprofen. These medicines can thin your blood. Do not take these medicines before your procedure if your health care provider instructs you not to.  Plan to have someone take you home after the procedure. PROCEDURE  You may be given a medicine to help you relax (sedative).  You will be given a medicine to make you sleep (general anesthetic).  Your abdomen will be inflated with a gas. This will make your organs easier to see.  Small incisions will be made in your abdomen.  A  laparoscope and other small instruments will be inserted into the abdomen through the incisions.  A tissue sample may be removed from an organ in the abdomen for examination.  The instruments will be removed from the abdomen.  The gas will be released.  The incisions will be closed with stitches (sutures). AFTER THE PROCEDURE  Your blood pressure, heart rate, breathing rate, and blood oxygen level will be monitored often until the medicines you were given have worn off.   This information is not intended to replace advice given to you by your health care provider. Make sure you discuss any questions you have with your health care provider.                                             Bowel Symptoms After Surgery After gynecologic surgery, women often have temporary changes in bowel function (constipation and gas pain).  Following are tips to help prevent and treat common bowel problems.  It also tells you when to call the doctor.  This is important because some symptoms might be a sign of a more serious bowel problem such as obstruction (bowel blockage).  These problems are rare but can happen after gynecologic surgery.   Besides surgery, what can temporarily affect bowel function? 1. Dietary changes   2. Decreased physical activity   3.Antibiotics   4. Pain medication   How can I prevent constipation (three days or more without a stool)? 1. Include fiber in your diet: whole grains, raw or dried fruits & vegetables, prunes, prune/pear juiceDrink at least 8 glasses of liquid (preferably water) every day 2. Avoid: ? Gas forming foods such as broccoli, beans, peas, salads, cabbage, sweet potatoes ? Greasy, fatty, or fried foods 3. Activity helps bowel function return to normal, walk around the house at least 3-4 times each day for 15 minutes or longer, if tolerated.  Rocking in a rocking chair is preferable to sitting still. 4. Stool softeners: these are not laxatives, but serve to soften  the stool to avoid straining.  Take 2-4 times a day until normal bowel function returns         Examples: Colace or generic equivalent (Docusate) 5. Bulk laxatives: provide a concentrated source of fiber.  They do not stimulate the bowel.    Take 1-2 times each day until normal bowel function return.              Examples: Citrucel, Metamucil, Fiberal, Fibercon   What can I take for "Gas Pains"? 1. Simethicone (Mylicon, Gas-X, Maalox-Gas, Mylanta-Gas) take 3-4 times a day 2. Maalox Regular - take 3-4 times a day 3. Mylanta Regular - take 3-4 times a day   What can I take if I become constipated? 1. Start with stool softeners and add additional laxatives below as needed to have a bowel movement every 1-2 days  2. Stool softeners 1-2 tablets, 2 times a day 3. Senokot 1-2 tablets, 1-2 times a day 4. Glycerin suppository can soften hard stool take once a day 5. Bisacodyl suppository once a day  6. Milk of Magnesia 30 mL 1-2 times a day 7. Fleets or tap water enema    What can I do for nausea?  1. Limit most solid foods for 24-48 hours 2. Continue eating small frequent amounts of liquids and/or bland soft foods ? Toast, crackers, cooked cereal (grits, cream of wheat, rice) 3. Benadryl: a mild anti-nausea medicine can be obtained without a prescription. May cause drowsiness, especially if taken with narcotic pain medicines 4. Contact provider for prescription nausea medication     What can I do, or take for diarrhea (more than five loose stools per day)? 1. Drink plenty of clear fluids to prevent dehydration 2. May take Kaopectate, Pepto-Bismol, Imodium, or probiotics for 1-2 days 3. Anusol or Preparation-H can be helpful for hemorrhoids and irritated tissue around anus   When should I call the doctor?             CONSTIPATION:   Not relieved after three days following the above program VOMITING:  That contains blood, "coffee ground" material  More the three times/hour and unable to  keep down nausea medication for more than eight hours  With dry mouth, dark or strong urine, feeling light-headed, dizzy, or confused  With severe abdominal pain or bloating for more than 24 hours DIARRHEA:  That continues for more then 24-48 hours despite treatment  That contains blood or tarry material  With dry mouth, dark or strong urine, feeling light~headed, dizzy, or confused FEVER:  101 F or higher along with nausea, vomiting, gas pain, diarrhea UNABLE TO:  Pass gas from rectum for more than 24 hours  Tolerate liquids by mouth for more than 24 hours        Laparoscopic Hysterectomy, Care After Refer to this sheet in the next few weeks. These instructions provide you with information on caring for yourself after your procedure. Your health care provider may also give you more specific instructions. Your treatment has been planned according to current medical practices, but problems sometimes occur. Call your health care provider if you have any problems or questions after your procedure. What can I expect after the procedure?  Pain and bruising at the incision sites. You will be given pain medicine to control it.  Menopausal symptoms such as hot flashes, night sweats, and insomnia if your ovaries were removed.  Sore throat from the breathing tube that was inserted during surgery. Follow these instructions at home:  Only take over-the-counter or prescription medicines for pain, discomfort, or fever as directed by your health care provider.  Do not take aspirin. It can cause bleeding.  Do not drive when taking pain medicine.  Follow your health care provider's advice regarding diet, exercise, lifting, driving, and general activities.    Resume your usual diet as directed and allowed.  Get plenty of rest and sleep.  Do not douche, use tampons, or have sexual intercourse for at least 6 weeks, or until your health care provider gives you permission.  Change your  bandages (dressings) as directed by your health care provider.  Monitor your temperature and notify your health care provider of a fever.  Take showers instead of baths for 2-3 weeks.  Do not drink alcohol until your health care provider gives you permission.  If you develop constipation, you may take a mild laxative with your health care provider's permission. Bran foods may help with constipation problems. Drinking enough fluids to keep your urine clear or pale yellow may help as well.  Try to have someone home with you for 1-2 weeks to help around the house.  Keep all of your follow-up appointments as directed by your health care provider. Contact a health care provider if:  You have swelling, redness, or increasing pain around your incision sites.  You have pus coming from your incision.  You notice a bad smell coming from your incision.  Your incision breaks open.  You feel dizzy or lightheaded.  You have pain or bleeding when you urinate.  You have persistent diarrhea.  You have persistent nausea and vomiting.  You have abnormal vaginal discharge.  You have a rash.  You have any type of abnormal reaction or develop an allergy to your medicine.  You have poor pain control with your prescribed medicine. Get help right away if:  You have chest pain or shortness of breath.  You have severe abdominal pain that is not relieved with pain medicine.  You have pain or swelling in your legs. This information is not intended to replace advice given to you by your health care provider. Make sure you discuss any questions you have with your health care provider. Document Released: 10/05/2013 Document Revised: 05/22/2016 Document Reviewed: 07/05/2013 Elsevier Interactive Patient Education  2017 [employer]                    

## 2018-09-15 NOTE — Progress Notes (Signed)
New dx of endometrial cancer

## 2018-09-15 NOTE — H&P (View-Only) (Signed)
Gynecologic Oncology Consult Visit   Referring Provider: Dr Marcelline Mates  Chief Complaint: grade 1 endometrial cancer  Subjective:  Molly [name] is a 67 y.o. female who is seen in consultation from Dr. Marcelline Mates for endometrial adenocarcinoma.   She has history of Grade 3 uterine prolapse with Grade 1 cystocele and rectocele. She noted pessary had become dislodged after coughing d/t pneumonia and presented to Dr. Marcelline Mates for evaluation 05/2018. At that time she noted post-menopausal bleeding and cramping and has history of cervical polyps. Bleeding was felt to be possibly related to pessary.   Ultrasound revealed: uterus measuring 10.2 x 5.5 x 5.7 cm, heterogenous with evidence of fibroids invading endometrium measuring 2.9 x 3.3 x 3.6 cm and two additional fibroids measuring 2.0 x 2.1 x 1.8, and 2.9 x 3.3 x 3.6. Endometrium measuring 4.7 mm. Neither ovary was visualized.   She had second episode of bleeding 08/31/18-09/04/18, felt to be heavier and more 'period like'. Endometrial biopsy was performed.  Pathology: - Endometrioid adenocarcinoma, figo grade 1.   Today, she reports that bleeding has stopped. She has mild swelling of her bilateral lower extremities which is chronic and no worse. Bowel movements are regular, no distention or bloating. Appetite is normal. 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    Problem List: Patient Active Problem List   Diagnosis Date Noted  . Umbilical hernia without obstruction and without gangrene   . Atherosclerosis of right coronary artery 11/10/2017  . Atherosclerosis of abdominal aorta ([place]) 11/10/2017  . Prediabetes 10/02/2017  . Pain in limb 03/31/2017  . Perennial allergic rhinitis with seasonal variation 05/14/2016  . Asthma, mild intermittent, well-controlled 05/14/2016  . Hypertension goal BP (blood pressure) < 140/90 12/10/2015  . Cardiomyopathy due to hypertension ([place]) 12/10/2015  . History of CVA (cerebrovascular accident) 12/10/2015  .  Hyperlipidemia LDL goal <70 12/10/2015  . Statin intolerance 12/10/2015    Past Medical History: Past Medical History:  Diagnosis Date  . Asthma    history of asthma  . Endometrial cancer ([place])   . GERD (gastroesophageal reflux disease)    takes prilosec prn  . History of CVA (cerebrovascular accident) 12/10/2015  . Hyperlipidemia LDL goal <70 12/10/2015  . Hypertension   . Pre-diabetes   . Prediabetes 10/02/2017   A1c 6 in January 2018  . Stroke [facility]) 1990    no residual effects    Past Surgical History: Past Surgical History:  Procedure Laterality Date  . CHOLECYSTECTOMY    . COLONOSCOPY WITH PROPOFOL N/A 01/08/2017   Procedure: COLONOSCOPY WITH PROPOFOL;  Surgeon: [name], MD;  Location: [facility] ENDOSCOPY;  Service: Endoscopy;  Laterality: N/A;  . COLONOSCOPY WITH PROPOFOL N/A 05/05/2018   Procedure: COLONOSCOPY WITH PROPOFOL;  Surgeon: [name], MD;  Location: [facility] ENDOSCOPY;  Service: Gastroenterology;  Laterality: N/A;  . HERNIA REPAIR    . UMBILICAL HERNIA REPAIR N/A 11/17/2017   Procedure: HERNIA REPAIR UMBILICAL ADULT;  Surgeon: Jules Husbands, MD;  Location: [facility] ORS;  Service: General;  Laterality: N/A;    Past Gynecologic History:  [id number] Denies abnormal pap smears.  History of OCP use.  Denies STD history   OB History:  OB History  Gravida Para Term Preterm AB Living  2 2 2     2   SAB TAB Ectopic Multiple Live Births          2    # Outcome Date GA Lbr Len/2nd Weight Sex Delivery Anes PTL Lv  2 Term 47  F Vag-Spont   LIV  1 Term 55    F Vag-Spont   LIV    Family History: Family History  Problem Relation Age of Onset  . Stroke Mother   . Hypertension Mother   . Dementia Mother   . Alzheimer's disease Mother   . Gout Father   . Asthma Father   . Hypertension Father   . Dementia Father   . Healthy Sister   . Stroke Brother   . Alzheimer's disease Brother   . Healthy Daughter   . Hypertension Brother   . Healthy Brother   . Cancer  Paternal Grandmother   . Healthy Sister   . Healthy Sister   . Hypertension Sister   . Hypertension Sister   . Stroke Brother   . Alzheimer's disease Brother   . Stroke Brother   . Hypertension Brother   . Stroke Brother   . Hypertension Brother   . Healthy Brother   . Healthy Brother   . Hypertension Daughter   . Breast cancer Neg Hx     Social History: Social History   Socioeconomic History  . Marital status: Divorced    Spouse name: Not on file  . Number of children: 2  . Years of education: some college  . Highest education level: 12th grade  Occupational History  . Occupation: Retired  Scientific laboratory technician  . Financial resource strain: Not hard at all  . Food insecurity:    Worry: Never true    Inability: Never true  . Transportation needs:    Medical: No    Non-medical: No  Tobacco Use  . Smoking status: Never Smoker  . Smokeless tobacco: Never Used  . Tobacco comment: smoking cessation materials not required  Substance and Sexual Activity  . Alcohol use: No    Alcohol/week: 0.0 standard drinks  . Drug use: No  . Sexual activity: Never  Lifestyle  . Physical activity:    Days per week: 0 days    Minutes per session: 0 min  . Stress: Not at all  Relationships  . Social connections:    Talks on phone: Patient refused    Gets together: Patient refused    Attends religious service: Patient refused    Active member of club or organization: Patient refused    Attends meetings of clubs or organizations: Patient refused    Relationship status: Divorced  . Intimate partner violence:    Fear of current or ex partner: No    Emotionally abused: No    Physically abused: No    Forced sexual activity: No  Other Topics Concern  . Not on file  Social History Narrative  . Not on file    Allergies: Allergies  Allergen Reactions  . Nsaids Hives and Rash  . Statins Other (See Comments)    Joint pains  . Victoza [Liraglutide] Other (See Comments)    pancreatitis   . Crestor [Rosuvastatin Calcium] Rash    Current Medications: Current Outpatient Medications  Medication Sig Dispense Refill  . albuterol (PROVENTIL HFA;VENTOLIN HFA) 108 (90 Base) MCG/ACT inhaler Inhale 2 puffs into the lungs every 4 (four) hours as needed for wheezing or shortness of breath. 1 Inhaler 0  . cetirizine (ZYRTEC) 10 MG tablet Take 1 tablet (10 mg total) by mouth daily as needed. 90 tablet 1  . diclofenac sodium (VOLTAREN) 1 % GEL Apply 2 g topically 3 (three) times daily as needed (pain). 100 g 1  . escitalopram (LEXAPRO) 10 MG  tablet Take 1 tablet (10 mg total) by mouth daily. 30 tablet 1  . ezetimibe (ZETIA) 10 MG tablet Take 1 tablet (10 mg total) by mouth daily. 90 tablet 1  . fluticasone furoate-vilanterol (BREO ELLIPTA) 100-25 MCG/INH AEPB Inhale 1 puff into the lungs daily. 60 each 0  . furosemide (LASIX) 40 MG tablet Take 1 tablet (40 mg total) by mouth daily. 90 tablet 1  . ketoconazole (NIZORAL) 2 % cream Apply 1 application topically daily as needed for irritation. On abdominal fold prn 60 g 0  . metFORMIN (GLUCOPHAGE-XR) 750 MG 24 hr tablet Take 1 tablet (750 mg total) by mouth daily with breakfast. 90 tablet 1  . Multiple Vitamin (MULTIVITAMIN WITH MINERALS) TABS tablet Take 1 tablet 4 (four) times a week by mouth.    . olmesartan-hydrochlorothiazide (BENICAR HCT) 20-12.5 MG tablet Take 1 tablet by mouth daily. 90 tablet 1  . omeprazole (PRILOSEC) 20 MG capsule Take 20 mg daily by mouth.    . Triamcinolone Acetonide (NASACORT ALLERGY 24HR NA) Place 1 spray daily as needed into the nose (allergies).    . calcium carbonate 1250 MG capsule Take 1,250 mg daily by mouth.     No current facility-administered medications for this visit.     Review of Systems General: negative for fevers, chills, fatigue, changes in sleep, changes in weight or appetite Skin: negative for changes in color, texture, moles or lesions Eyes: negative for changes in vision, pain,  diplopia HEENT: negative for change in hearing, pain, discharge, tinnitus, vertigo, voice changes, sore throat, neck masses Breasts: negative for breast lumps Pulmonary: negative for dyspnea, orthopnea, productive cough Cardiac: negative for palpitations, syncope, pain, discomfort, pressure Gastrointestinal: negative for dysphagia, nausea, vomiting, jaundice, pain, constipation, diarrhea, hematemesis, hematochezia Genitourinary/Sexual: negative for dysuria, discharge, hesitancy, nocturia, retention, stones, infections, STD's, incontinence Ob/Gyn: negative for pain Musculoskeletal: negative for pain, stiffness, swelling, range of motion limitation Hematology: negative for easy bruising, bleeding Neurologic/Psych: negative for headaches, seizures, paralysis, weakness, tremor, change in gait, change in sensation, mood swings, depression, anxiety, change in memory  Objective:  Physical Examination:  BP 131/78 (BP Location: Left Arm, Patient Position: Sitting)   Pulse (!) 55   Temp 98 F (36.7 C) (Oral)   Ht 4\' 11"  (1.499 m)   Wt 211 lb 6.4 oz (95.9 kg)   BMI 42.70 kg/m     ECOG Performance Status: 0 - Asymptomatic  GENERAL: Patient is a well appearing female in no acute distress HEENT:  PERRL, neck supple with midline trachea. Thyroid without masses.  NODES:  No cervical, supraclavicular, axillary, or inguinal lymphadenopathy palpated.  LUNGS:  Clear to auscultation bilaterally.  No wheezes or rhonchi. HEART:  Regular rate and rhythm. No murmur appreciated. ABDOMEN:  Soft, nontender.  Positive, normoactive bowel sounds.  MSK:  No focal spinal tenderness to palpation. Full range of motion bilaterally in the upper extremities. EXTREMITIES:  No peripheral edema.   SKIN:  Clear with no obvious rashes or skin changes. No nail dyscrasia. NEURO:  Nonfocal. Well oriented.  Appropriate affect.  Pelvic: EGBUS: no lesions Cervix: no lesions, nontender, mobile Vagina: no lesions, no discharge  or bleeding. Mild cystocele, no rectocele. Uterus: normal size, nontender, mobile Adnexa: no palpable masses Rectovaginal: confirmatory    Assessment:  JAMAYAH MYSZKA is a 67 y.o. female diagnosed with grade 1 endometrial cancer.  History of uterine prolapse and has used a pessary.  No clinically significant cystocele or rectocele or SUI.  Also has small fibroids.  Had umbilical hernia repair last year without mesh.   Good performance status presently despite co-morbidities below.  Medical co-morbidities complicating care: Morbid obesity, CVA 1990 with no residual and no medication, HTN with good BP today.    Plan:   Problem List Items Addressed This Visit    None    Visit Diagnoses    Endometrial adenocarcinoma ([place])    -  Primary     We discussed options for management including surgery. We recommend total laparoscopic hysterectomy with sentinel lymph node mapping and biopsies, possible X lap, possible pelvic/aortic node dissection if more extensive disease found,  and surgery scheduled for 10/06/18.   The risks of surgery were discussed in detail and she understands these to include infection; wound separation; hernia; vaginal cuff separation, injury to adjacent organs such as bowel, bladder, blood vessels, ureters and nerves; bleeding which may require blood transfusion; anesthesia risk; thromboembolic events; possible death; unforeseen complications; possible need for re-exploration; medical complications such as heart attack, stroke, pleural effusion and pneumonia; and, if staging performed the risk of lymphedema and lymphocyst.  The patient will receive DVT and antibiotic prophylaxis as indicated.  She voiced a clear understanding.  She had the opportunity to ask questions and written informed consent was obtained today.  The patient's diagnosis, an outline of the further diagnostic and laboratory studies which will be required, the recommendation for surgery, and alternatives were  discussed with her and her accompanying family members.  All questions were answered to their satisfaction.  A total of 60 minutes were spent with the patient/family today; 40% was spent in education, counseling and coordination of care for uterine cancer.    Molly Drown, MD  CC:  Rubie Maid, [address], [place] [id number] [phone]

## 2018-09-16 ENCOUNTER — Telehealth: Payer: Self-pay

## 2018-09-16 NOTE — Telephone Encounter (Signed)
Surgery booking request faxed to OR scheduling with confirmation of receipt. Copy of consent faxed to PAT with confirmation of receipt. Original consent sent to medical records for filing. Surgery scheduled for 10/06/18. PAT arranged for 09/28/18 at 0945 in the [place], [address]. Voicemail left with MS. Kenealy to return call to notify of PAT appt details. Oncology Nurse Navigator Documentation  Navigator Location: [facility] (09/16/18 1400)   )Navigator Encounter Type: Telephone;Other (09/16/18 1400) Telephone: Outgoing Call (09/16/18 1400)                       Barriers/Navigation Needs: Coordination of Care (09/16/18 1400)   Interventions: Coordination of Care (09/16/18 1400)                      Time Spent with Patient: 30 (09/16/18 1400)

## 2018-09-17 ENCOUNTER — Telehealth: Payer: Self-pay | Admitting: Obstetrics and Gynecology

## 2018-09-17 NOTE — Telephone Encounter (Signed)
F/U TBD after Surgery date is scheduled, per Courtney/verbal.

## 2018-09-21 ENCOUNTER — Telehealth: Payer: Self-pay

## 2018-09-21 NOTE — Telephone Encounter (Signed)
Call placed to Molly [name]. Reviewed PAT appointment that has been arranged for 09/28/18 at 0945 in the [facility], [address]. Post operative appointment made with her for 11/10/18 at 1530. We reviewed post operative instructions for surgery. She has no further questions at this time regarding her surgery. Encouraged her to call me if any arise. Oncology Nurse Navigator Documentation  Navigator Location: [facility] (09/21/18 1500)   )Navigator Encounter Type: Telephone (09/21/18 1500) Telephone: Outgoing Call (09/21/18 1500)                                                  Time Spent with Patient: 15 (09/21/18 1500)

## 2018-09-21 NOTE — Telephone Encounter (Signed)
Patient's daughter called stating that her mom was in a lot of pain and was doing some bleeding. She stated that she was very weak and nauseated.  After consulting with Dr. Ancil Boozer, patient's daughter was advised to contact her OB/GYN asap to see if they could do something to stop the bleeding.

## 2018-09-27 NOTE — Telephone Encounter (Signed)
Pt called and was not able to be reach. No voicemail set up.

## 2018-09-28 ENCOUNTER — Other Ambulatory Visit: Payer: Self-pay

## 2018-09-28 ENCOUNTER — Encounter
Admission: RE | Admit: 2018-09-28 | Discharge: 2018-09-28 | Disposition: A | Discharge status: Home / Self Care | Payer: Medicare Other | Source: Ambulatory Visit | Attending: Obstetrics and Gynecology | Admitting: Obstetrics and Gynecology

## 2018-09-28 ENCOUNTER — Other Ambulatory Visit: Payer: Self-pay | Admitting: Obstetrics and Gynecology

## 2018-09-28 DIAGNOSIS — R0602 Shortness of breath: Secondary | ICD-10-CM | POA: Insufficient documentation

## 2018-09-28 DIAGNOSIS — Z01818 Encounter for other preprocedural examination: Secondary | ICD-10-CM | POA: Insufficient documentation

## 2018-09-28 DIAGNOSIS — R001 Bradycardia, unspecified: Secondary | ICD-10-CM | POA: Insufficient documentation

## 2018-09-28 DIAGNOSIS — R9431 Abnormal electrocardiogram [ECG] [EKG]: Secondary | ICD-10-CM | POA: Insufficient documentation

## 2018-09-28 DIAGNOSIS — C541 Malignant neoplasm of endometrium: Secondary | ICD-10-CM | POA: Diagnosis not present

## 2018-09-28 HISTORY — DX: Anxiety disorder, unspecified: F41.9

## 2018-09-28 HISTORY — DX: Other complications of anesthesia, initial encounter: T88.59XA

## 2018-09-28 HISTORY — DX: Depression, unspecified: F32.A

## 2018-09-28 HISTORY — DX: Cardiomyopathy, unspecified: I42.9

## 2018-09-28 HISTORY — DX: Major depressive disorder, single episode, unspecified: F32.9

## 2018-09-28 HISTORY — DX: Adverse effect of unspecified anesthetic, initial encounter: T41.45XA

## 2018-09-28 LAB — CBC WITH DIFFERENTIAL/PLATELET
BASOS ABS: 0 10*3/uL (ref 0–0.1)
Basophils Relative: 1 %
EOS ABS: 0.1 10*3/uL (ref 0–0.7)
EOS PCT: 1 %
HCT: 35.7 % (ref 35.0–47.0)
HEMOGLOBIN: 12.3 g/dL (ref 12.0–16.0)
LYMPHS ABS: 2.8 10*3/uL (ref 1.0–3.6)
LYMPHS PCT: 45 %
MCH: 30.8 pg (ref 26.0–34.0)
MCHC: 34.5 g/dL (ref 32.0–36.0)
MCV: 89.4 fL (ref 80.0–100.0)
Monocytes Absolute: 0.4 10*3/uL (ref 0.2–0.9)
Monocytes Relative: 7 %
NEUTROS PCT: 46 %
Neutro Abs: 2.8 10*3/uL (ref 1.4–6.5)
PLATELETS: 323 10*3/uL (ref 150–440)
RBC: 3.99 MIL/uL (ref 3.80–5.20)
RDW: 13.4 % (ref 11.5–14.5)
WBC: 6.1 10*3/uL (ref 3.6–11.0)

## 2018-09-28 LAB — TYPE AND SCREEN
ABO/RH(D): A POS
ANTIBODY SCREEN: NEGATIVE

## 2018-09-28 LAB — COMPREHENSIVE METABOLIC PANEL
ALK PHOS: 70 U/L (ref 38–126)
ALT: 13 U/L (ref 0–44)
AST: 20 U/L (ref 15–41)
Albumin: 3.9 g/dL (ref 3.5–5.0)
Anion gap: 8 (ref 5–15)
BILIRUBIN TOTAL: 0.7 mg/dL (ref 0.3–1.2)
BUN: 12 mg/dL (ref 8–23)
CALCIUM: 9.1 mg/dL (ref 8.9–10.3)
CO2: 29 mmol/L (ref 22–32)
CREATININE: 0.67 mg/dL (ref 0.44–1.00)
Chloride: 104 mmol/L (ref 98–111)
GFR calc Af Amer: >60 mL/min (ref >60)
GLUCOSE: 100 mg/dL — AB (ref 70–99)
POTASSIUM: 3.5 mmol/L (ref 3.5–5.1)
Sodium: 141 mmol/L (ref 135–145)
TOTAL PROTEIN: 7.1 g/dL (ref 6.5–8.1)

## 2018-09-28 LAB — APTT: APTT: 34 s (ref 24–36)

## 2018-09-28 LAB — PROTIME-INR
INR: 1.05
PROTHROMBIN TIME: 13.6 s (ref 11.4–15.2)

## 2018-09-28 MED ORDER — TRAMADOL HCL 50 MG PO TABS: 50.0000 mg | ORAL_TABLET | Freq: Four times a day (QID) | ORAL | 0 refills | Status: DC | PRN

## 2018-09-28 NOTE — Patient Instructions (Addendum)
Your procedure is scheduled on: Wednesday, October 06, 2018  Report to [place]     DO NOT STOP ON THE FIRST FLOOR TO REGISTER  To find out your arrival time please call [phone] between 1PM - 3PM on Tuesday, October 05, 2018  Remember: Instructions that are not followed completely may result in serious medical risk,  up to and including death, or upon the discretion of your surgeon and anesthesiologist your  surgery may need to be rescheduled.     _X__ 1. Do not eat food after midnight the night before your procedure.                 No gum chewing or hard candies.                    ABSOLUTELY NOTHING SOLID IN YOUR MOUTH AFTER MIDNIGHT                  You may drink clear liquids up to 2 hours before you are scheduled to arrive for your surgery-                   DO not drink clear liquids within 2 hours of the start of your surgery.                  Clear Liquids include:  water, apple juice without pulp, clear carbohydrate                 drink such as Clearfast of Gatorade, Black Coffee or Tea (Do not add                 anything to coffee or tea).  __X__2.  On the morning of surgery brush your teeth with toothpaste and water,                  You may rinse your mouth with mouthwash if you wish.                     Do not swallow any toothpaste of mouthwash.     _X__ 3.  No Alcohol for 24 hours before or after surgery.   _X__ 4.  Do Not Smoke or use e-cigarettes For 24 Hours Prior to Your Surgery.                 Do not use any chewable tobacco products for at least 6 hours prior to                 surgery.  ____  5.  Bring all medications with you on the day of surgery if instructed.   ____  6.  Notify your doctor if there is any change in your medical condition      (cold, fever, infections).     Do not wear jewelry, make-up, hairpins, clips or nail polish. Do not wear lotions, powders, or perfumes. You may wear  deodorant. Do not shave 48 hours prior to surgery. Men may shave face and neck. Do not bring valuables to the hospital.    [facility] is not responsible for any belongings or valuables.  Contacts, dentures or bridgework may not be worn into surgery. Leave your suitcase in the car. After surgery it may be brought to your room. For patients admitted to the hospital, discharge time is determined by your treatment team.   Patients discharged the day of  surgery will not be allowed to drive home.   Please read over the following fact sheets that you were given:   PREPARING FOR SURGERY   __X__ Take these medicines the morning of surgery with A SIP OF WATER:    1. ALBUTEROL       2. OMEPRAZOLE  3. ZYRTEC   4.  5.  6.  ____ Fleet Enema (as directed)   __X__ Use CHG Soap as directed  __X__ Use inhalers on the day of surgery  __X__ Stop metformin 2 days prior to surgery. LAST DOSE TO BE TAKEN ON October 03, 2018    __X__ Stop ALL ASPIRIN PRODUCTS AS OF TODAY  _X___ Stop Anti-inflammatories AS OF TODAY   ____ Stop supplements until after surgery.    ____ Bring C-Pap to the hospital.   CALL YOUR DOCTOR AND TALK ABOUT LEXAPRO.  STOP VOLTAREN GEL AS OF TODAY  CONTINUE TAKING YOUR CALCIUM PLUS D BUT DO NOT TAKE ON THE DAY OF SURGERY  CONTINUE TAKING LASIX BUT DO  NOT TAKE ON DAY OF SURGERY  CONTINUE TAKING BENICAR AS USUAL, JUST DO NOT TAKE ON DAY OF SURGERY  HAVE STOOL SOFTENERS AVAILABLE FOR USE AT HOME.  HAVE A PILLOW AT HOME FOR ABDOMINAL SUPPORT  USE ENEMA ON THE MORNING OF SURGERY PRIOR TO COMING TO [place].

## 2018-09-29 NOTE — Pre-Procedure Instructions (Signed)
EKG COMPARED WITH PREVIOUS 

## 2018-10-05 ENCOUNTER — Encounter: Payer: Self-pay | Admitting: Anesthesiology

## 2018-10-05 MED ORDER — CEFAZOLIN SODIUM-DEXTROSE 2-4 GM/100ML-% IV SOLN
2.0000 g | INTRAVENOUS | Status: AC
  Administered 2018-10-06: 2 g via INTRAVENOUS

## 2018-10-06 ENCOUNTER — Encounter
Admission: RE | Disposition: A | Discharge status: Home / Self Care | Payer: Self-pay | Source: Ambulatory Visit | Attending: Obstetrics and Gynecology

## 2018-10-06 ENCOUNTER — Other Ambulatory Visit: Payer: Self-pay

## 2018-10-06 ENCOUNTER — Ambulatory Visit: Payer: Medicare Other | Admitting: Anesthesiology

## 2018-10-06 ENCOUNTER — Ambulatory Visit
Admission: RE | Admit: 2018-10-06 | Discharge: 2018-10-06 | Disposition: A | Discharge status: Home / Self Care | Payer: Medicare Other | Source: Ambulatory Visit | Attending: Obstetrics and Gynecology | Admitting: Obstetrics and Gynecology

## 2018-10-06 DIAGNOSIS — Z6841 Body Mass Index (BMI) 40.0 and over, adult: Secondary | ICD-10-CM | POA: Diagnosis not present

## 2018-10-06 DIAGNOSIS — Z7951 Long term (current) use of inhaled steroids: Secondary | ICD-10-CM | POA: Diagnosis not present

## 2018-10-06 DIAGNOSIS — N888 Other specified noninflammatory disorders of cervix uteri: Secondary | ICD-10-CM | POA: Diagnosis not present

## 2018-10-06 DIAGNOSIS — K660 Peritoneal adhesions (postprocedural) (postinfection): Secondary | ICD-10-CM | POA: Diagnosis not present

## 2018-10-06 DIAGNOSIS — C775 Secondary and unspecified malignant neoplasm of intrapelvic lymph nodes: Secondary | ICD-10-CM | POA: Insufficient documentation

## 2018-10-06 DIAGNOSIS — I10 Essential (primary) hypertension: Secondary | ICD-10-CM | POA: Insufficient documentation

## 2018-10-06 DIAGNOSIS — J4520 Mild intermittent asthma, uncomplicated: Secondary | ICD-10-CM | POA: Insufficient documentation

## 2018-10-06 DIAGNOSIS — F329 Major depressive disorder, single episode, unspecified: Secondary | ICD-10-CM | POA: Diagnosis not present

## 2018-10-06 DIAGNOSIS — N841 Polyp of cervix uteri: Secondary | ICD-10-CM | POA: Diagnosis not present

## 2018-10-06 DIAGNOSIS — Z79899 Other long term (current) drug therapy: Secondary | ICD-10-CM | POA: Insufficient documentation

## 2018-10-06 DIAGNOSIS — F419 Anxiety disorder, unspecified: Secondary | ICD-10-CM | POA: Diagnosis not present

## 2018-10-06 DIAGNOSIS — D259 Leiomyoma of uterus, unspecified: Secondary | ICD-10-CM | POA: Diagnosis not present

## 2018-10-06 DIAGNOSIS — E785 Hyperlipidemia, unspecified: Secondary | ICD-10-CM | POA: Insufficient documentation

## 2018-10-06 DIAGNOSIS — Z8673 Personal history of transient ischemic attack (TIA), and cerebral infarction without residual deficits: Secondary | ICD-10-CM | POA: Diagnosis not present

## 2018-10-06 DIAGNOSIS — I2510 Atherosclerotic heart disease of native coronary artery without angina pectoris: Secondary | ICD-10-CM | POA: Diagnosis not present

## 2018-10-06 DIAGNOSIS — Z7984 Long term (current) use of oral hypoglycemic drugs: Secondary | ICD-10-CM | POA: Insufficient documentation

## 2018-10-06 DIAGNOSIS — N72 Inflammatory disease of cervix uteri: Secondary | ICD-10-CM | POA: Insufficient documentation

## 2018-10-06 DIAGNOSIS — K219 Gastro-esophageal reflux disease without esophagitis: Secondary | ICD-10-CM | POA: Diagnosis not present

## 2018-10-06 DIAGNOSIS — C541 Malignant neoplasm of endometrium: Secondary | ICD-10-CM | POA: Diagnosis not present

## 2018-10-06 DIAGNOSIS — E119 Type 2 diabetes mellitus without complications: Secondary | ICD-10-CM | POA: Diagnosis not present

## 2018-10-06 DIAGNOSIS — N800 Endometriosis of uterus: Secondary | ICD-10-CM | POA: Diagnosis not present

## 2018-10-06 DIAGNOSIS — N840 Polyp of corpus uteri: Secondary | ICD-10-CM | POA: Diagnosis not present

## 2018-10-06 HISTORY — PX: SENTINEL NODE BIOPSY: SHX6608

## 2018-10-06 LAB — ABO/RH: ABO/RH(D): A POS

## 2018-10-06 LAB — GLUCOSE, CAPILLARY
Glucose-Capillary: 106 mg/dL — ABNORMAL HIGH (ref 70–99)
Glucose-Capillary: 142 mg/dL — ABNORMAL HIGH (ref 70–99)

## 2018-10-06 SURGERY — HYSTERECTOMY, TOTAL, LAPAROSCOPIC, WITH BILATERAL SALPINGO-OOPHORECTOMY
Anesthesia: General

## 2018-10-06 MED ORDER — SODIUM CHLORIDE 0.9 % IV SOLN
INTRAVENOUS | Status: DC | PRN
  Administered 2018-10-06: 50 ug/min via INTRAVENOUS

## 2018-10-06 MED ORDER — CHLORHEXIDINE GLUCONATE CLOTH 2 % EX PADS: 6.0000 | MEDICATED_PAD | Freq: Once | CUTANEOUS | Status: DC

## 2018-10-06 MED ORDER — SUGAMMADEX SODIUM 200 MG/2ML IV SOLN
INTRAVENOUS | Status: DC | PRN
  Administered 2018-10-06: 191.6 mg via INTRAVENOUS

## 2018-10-06 MED ORDER — LIDOCAINE HCL (CARDIAC) PF 100 MG/5ML IV SOSY
PREFILLED_SYRINGE | INTRAVENOUS | Status: DC | PRN
  Administered 2018-10-06: 100 mg via INTRAVENOUS

## 2018-10-06 MED ORDER — OXYCODONE HCL 5 MG PO TABS
5.0000 mg | ORAL_TABLET | ORAL | Status: DC | PRN
  Administered 2018-10-06: 5 mg via ORAL
  Filled 2018-10-06 (×2): qty 1

## 2018-10-06 MED ORDER — DOCUSATE SODIUM 100 MG PO CAPS: 100.0000 mg | ORAL_CAPSULE | Freq: Two times a day (BID) | ORAL | 2 refills | Status: DC | PRN

## 2018-10-06 MED ORDER — MIDAZOLAM HCL 2 MG/2ML IJ SOLN
INTRAMUSCULAR | Status: AC
  Filled 2018-10-06: qty 2

## 2018-10-06 MED ORDER — SUCCINYLCHOLINE CHLORIDE 20 MG/ML IJ SOLN
INTRAMUSCULAR | Status: DC | PRN
  Administered 2018-10-06: 120 mg via INTRAVENOUS

## 2018-10-06 MED ORDER — ACETAMINOPHEN 500 MG PO TABS
1000.0000 mg | ORAL_TABLET | ORAL | Status: AC
  Administered 2018-10-06: 1000 mg via ORAL

## 2018-10-06 MED ORDER — CEFAZOLIN SODIUM-DEXTROSE 2-4 GM/100ML-% IV SOLN
INTRAVENOUS | Status: AC
  Filled 2018-10-06: qty 100

## 2018-10-06 MED ORDER — FLEET ENEMA 7-19 GM/118ML RE ENEM
1.0000 | ENEMA | Freq: Once | RECTAL | Status: AC
  Administered 2018-10-06: 1 via RECTAL

## 2018-10-06 MED ORDER — PROPOFOL 10 MG/ML IV BOLUS
INTRAVENOUS | Status: AC
  Filled 2018-10-06: qty 20

## 2018-10-06 MED ORDER — FENTANYL CITRATE (PF) 100 MCG/2ML IJ SOLN
25.0000 ug | INTRAMUSCULAR | Status: DC | PRN
  Administered 2018-10-06 (×4): 25 ug via INTRAVENOUS

## 2018-10-06 MED ORDER — INDOCYANINE GREEN 25 MG IV SOLR
INTRAVENOUS | Status: DC | PRN
  Administered 2018-10-06: 25 mg

## 2018-10-06 MED ORDER — TRAMADOL HCL 50 MG PO TABS: 50.0000 mg | ORAL_TABLET | Freq: Four times a day (QID) | ORAL | 0 refills | Status: DC | PRN

## 2018-10-06 MED ORDER — FENTANYL CITRATE (PF) 100 MCG/2ML IJ SOLN
INTRAMUSCULAR | Status: DC | PRN
  Administered 2018-10-06 (×4): 50 ug via INTRAVENOUS

## 2018-10-06 MED ORDER — ROCURONIUM BROMIDE 100 MG/10ML IV SOLN
INTRAVENOUS | Status: DC | PRN
  Administered 2018-10-06: 10 mg via INTRAVENOUS
  Administered 2018-10-06: 40 mg via INTRAVENOUS
  Administered 2018-10-06: 20 mg via INTRAVENOUS

## 2018-10-06 MED ORDER — FENTANYL CITRATE (PF) 250 MCG/5ML IJ SOLN
INTRAMUSCULAR | Status: AC
  Filled 2018-10-06: qty 5

## 2018-10-06 MED ORDER — PROPOFOL 10 MG/ML IV BOLUS
INTRAVENOUS | Status: DC | PRN
  Administered 2018-10-06: 110 mg via INTRAVENOUS

## 2018-10-06 MED ORDER — ONDANSETRON HCL 4 MG/2ML IJ SOLN: 4.0000 mg | Freq: Once | INTRAMUSCULAR | Status: DC | PRN

## 2018-10-06 MED ORDER — INDOCYANINE GREEN 25 MG IV SOLR
INTRAVENOUS | Status: AC
  Filled 2018-10-06: qty 25

## 2018-10-06 MED ORDER — EPHEDRINE SULFATE 50 MG/ML IJ SOLN
INTRAMUSCULAR | Status: DC | PRN
  Administered 2018-10-06 (×2): 10 mg via INTRAVENOUS

## 2018-10-06 MED ORDER — BUPIVACAINE HCL (PF) 0.5 % IJ SOLN
INTRAMUSCULAR | Status: DC | PRN
  Administered 2018-10-06: 10 mL

## 2018-10-06 MED ORDER — MIDAZOLAM HCL 2 MG/2ML IJ SOLN
INTRAMUSCULAR | Status: DC | PRN
  Administered 2018-10-06 (×2): 1 mg via INTRAVENOUS

## 2018-10-06 MED ORDER — ENOXAPARIN SODIUM 40 MG/0.4ML ~~LOC~~ SOLN
40.0000 mg | Freq: Once | SUBCUTANEOUS | Status: AC
  Administered 2018-10-06: 40 mg via SUBCUTANEOUS
  Filled 2018-10-06: qty 0.4

## 2018-10-06 MED ORDER — FENTANYL CITRATE (PF) 100 MCG/2ML IJ SOLN
INTRAMUSCULAR | Status: AC
  Administered 2018-10-06: 25 ug via INTRAVENOUS
  Filled 2018-10-06: qty 2

## 2018-10-06 MED ORDER — ONDANSETRON HCL 4 MG/2ML IJ SOLN
INTRAMUSCULAR | Status: DC | PRN
  Administered 2018-10-06 (×2): 4 mg via INTRAVENOUS

## 2018-10-06 MED ORDER — ACETAMINOPHEN 500 MG PO TABS
ORAL_TABLET | ORAL | Status: AC
  Administered 2018-10-06: 1000 mg via ORAL
  Filled 2018-10-06: qty 2

## 2018-10-06 MED ORDER — OXYCODONE HCL 5 MG PO TABS
ORAL_TABLET | ORAL | Status: AC
  Administered 2018-10-06: 5 mg via ORAL
  Filled 2018-10-06: qty 1

## 2018-10-06 MED ORDER — ACETAMINOPHEN 500 MG PO TABS: 1000.0000 mg | ORAL_TABLET | Freq: Four times a day (QID) | ORAL | 0 refills | Status: DC | PRN

## 2018-10-06 MED ORDER — BUPIVACAINE HCL (PF) 0.5 % IJ SOLN
INTRAMUSCULAR | Status: AC
  Filled 2018-10-06: qty 30

## 2018-10-06 MED ORDER — GLYCOPYRROLATE 0.2 MG/ML IJ SOLN
INTRAMUSCULAR | Status: DC | PRN
  Administered 2018-10-06: 0.2 mg via INTRAVENOUS

## 2018-10-06 MED ORDER — DEXAMETHASONE SODIUM PHOSPHATE 10 MG/ML IJ SOLN
INTRAMUSCULAR | Status: DC | PRN
  Administered 2018-10-06: 10 mg via INTRAVENOUS

## 2018-10-06 MED ORDER — OXYCODONE HCL 5 MG PO TABS: 5.0000 mg | ORAL_TABLET | ORAL | 0 refills | Status: DC | PRN

## 2018-10-06 MED ORDER — CHLORHEXIDINE GLUCONATE CLOTH 2 % EX PADS
6.0000 | MEDICATED_PAD | Freq: Once | CUTANEOUS | Status: AC
  Administered 2018-10-06: 6 via TOPICAL

## 2018-10-06 MED ORDER — SODIUM CHLORIDE 0.9 % IV SOLN
INTRAVENOUS | Status: DC
  Administered 2018-10-06 (×2): via INTRAVENOUS

## 2018-10-06 SURGICAL SUPPLY — 64 items
APPLICATOR SURGIFLO ENDO (HEMOSTASIS) IMPLANT
BAG URINE DRAINAGE (UROLOGICAL SUPPLIES) ×3 IMPLANT
BLADE SURG 15 STRL LF DISP TIS (BLADE) ×2 IMPLANT
BLADE SURG 15 STRL SS (BLADE) ×1
CANISTER SUCT 1200ML W/VALVE (MISCELLANEOUS) ×3 IMPLANT
CANNULA DILATOR 5 W/SLV (CANNULA) ×2 IMPLANT
CATH FOLEY 2WAY  5CC 16FR (CATHETERS) ×1
CATH URTH 16FR FL 2W BLN LF (CATHETERS) ×2 IMPLANT
CHLORAPREP W/TINT 26ML (MISCELLANEOUS) ×3 IMPLANT
CORD MONOPOLAR M/FML 12FT (MISCELLANEOUS) ×3 IMPLANT
COVER WAND RF STERILE (DRAPES) ×3 IMPLANT
DEFOGGER SCOPE WARMER CLEARIFY (MISCELLANEOUS) ×3 IMPLANT
DERMABOND ADVANCED (GAUZE/BANDAGES/DRESSINGS) ×1
DERMABOND ADVANCED .7 DNX12 (GAUZE/BANDAGES/DRESSINGS) ×2 IMPLANT
DEVICE SUTURE ENDOST 10MM (ENDOMECHANICALS) ×3 IMPLANT
DRAPE STERI POUCH LG 24X46 STR (DRAPES) ×6 IMPLANT
DRSG TELFA 4X3 1S NADH ST (GAUZE/BANDAGES/DRESSINGS) ×1 IMPLANT
GLOVE BIO SURGEON STRL SZ8 (GLOVE) ×12 IMPLANT
GLOVE INDICATOR 8.0 STRL GRN (GLOVE) ×6 IMPLANT
GOWN STRL REUS W/ TWL LRG LVL3 (GOWN DISPOSABLE) ×4 IMPLANT
GOWN STRL REUS W/ TWL XL LVL3 (GOWN DISPOSABLE) ×2 IMPLANT
GOWN STRL REUS W/TWL LRG LVL3 (GOWN DISPOSABLE) ×2
GOWN STRL REUS W/TWL XL LVL3 (GOWN DISPOSABLE) ×1
GRASPER SUT TROCAR 14GX15 (MISCELLANEOUS) ×3 IMPLANT
IRRIGATION STRYKERFLOW (MISCELLANEOUS) IMPLANT
IRRIGATOR STRYKERFLOW (MISCELLANEOUS)
IV LACTATED RINGERS 1000ML (IV SOLUTION) ×2 IMPLANT
KIT PINK PAD W/HEAD ARE REST (MISCELLANEOUS) ×3
KIT PINK PAD W/HEAD ARM REST (MISCELLANEOUS) ×2 IMPLANT
LABEL OR SOLS (LABEL) ×3 IMPLANT
LIGASURE VESSEL 5MM BLUNT TIP (ELECTROSURGICAL) ×3 IMPLANT
MANIPULATOR VCARE LG CRV RETR (MISCELLANEOUS) ×1 IMPLANT
MANIPULATOR VCARE SML CRV RETR (MISCELLANEOUS) IMPLANT
MANIPULATOR VCARE STD CRV RETR (MISCELLANEOUS) IMPLANT
NDL INSUFF ACCESS 14 VERSASTEP (NEEDLE) ×3 IMPLANT
NDL SPNL 22GX5 LNG QUINC BK (NEEDLE) ×2 IMPLANT
NEEDLE SPNL 22GX5 LNG QUINC BK (NEEDLE) ×3 IMPLANT
NS IRRIG 500ML POUR BTL (IV SOLUTION) ×3 IMPLANT
OCCLUDER COLPOPNEUMO (BALLOONS) ×4 IMPLANT
PACK GYN LAPAROSCOPIC (MISCELLANEOUS) ×3 IMPLANT
PAD OB MATERNITY 4.3X12.25 (PERSONAL CARE ITEMS) ×3 IMPLANT
PAD PREP 24X41 OB/GYN DISP (PERSONAL CARE ITEMS) ×3 IMPLANT
PORT ACCESS TROCAR AIRSEAL 5 (TROCAR) ×1 IMPLANT
POUCH ENDO CATCH II 15MM (MISCELLANEOUS) ×1 IMPLANT
SET CYSTO W/LG BORE CLAMP LF (SET/KITS/TRAYS/PACK) ×3 IMPLANT
SET TRI-LUMEN FLTR TB AIRSEAL (TUBING) ×1 IMPLANT
SHEARS ENDO 5MM 31CM (CUTTER) ×3 IMPLANT
SPOGE SURGIFLO 8M (HEMOSTASIS)
SPONGE LAP 4X18 RFD (DISPOSABLE) IMPLANT
SPONGE SURGIFLO 8M (HEMOSTASIS) IMPLANT
SPONGE XRAY 4X4 16PLY STRL (MISCELLANEOUS) ×1 IMPLANT
SUT ENDO VLOC 180-0-8IN (SUTURE) ×3 IMPLANT
SUT MNCRL 4-0 (SUTURE) ×2
SUT MNCRL 4-0 27XMFL (SUTURE) ×4
SUT VIC AB 0 CT1 36 (SUTURE) ×3 IMPLANT
SUT VIC AB 2-0 UR6 27 (SUTURE) ×3 IMPLANT
SUTURE MNCRL 4-0 27XMF (SUTURE) ×4 IMPLANT
SYR 10ML LL (SYRINGE) ×3 IMPLANT
SYR 3ML LL SCALE MARK (SYRINGE) ×6 IMPLANT
SYR 50ML LL SCALE MARK (SYRINGE) ×6 IMPLANT
TROCAR BLUNT TIP 12MM OMST12BT (TROCAR) ×3 IMPLANT
TROCAR VERSASTEP PLUS 12MM (TROCAR) ×3 IMPLANT
TROCAR VERSASTEP PLUS 5MM (TROCAR) ×6 IMPLANT
TUBING INSUF HEATED (TUBING) ×2 IMPLANT

## 2018-10-06 NOTE — Anesthesia Procedure Notes (Signed)
Procedure Name: Intubation Date/Time: 10/06/2018 7:53 AM Performed by: Nelda Marseille, CRNA Pre-anesthesia Checklist: Patient identified, Patient being monitored, Timeout performed, Emergency Drugs available and Suction available Patient Re-evaluated:Patient Re-evaluated prior to induction Oxygen Delivery Method: Circle system utilized Preoxygenation: Pre-oxygenation with 100% oxygen Induction Type: IV induction Ventilation: Mask ventilation without difficulty Laryngoscope Size: Mac, 3 and McGraph Grade View: Grade I Tube type: Oral Tube size: 7.0 mm Number of attempts: 1 Airway Equipment and Method: Stylet and Video-laryngoscopy Placement Confirmation: ETT inserted through vocal cords under direct vision,  positive ETCO2 and breath sounds checked- equal and bilateral Secured at: 21 cm Tube secured with: Tape Dental Injury: Teeth and Oropharynx as per pre-operative assessment  Difficulty Due To: Difficulty was anticipated and Difficult Airway- due to large tongue

## 2018-10-06 NOTE — Transfer of Care (Signed)
Immediate Anesthesia Transfer of Care Note  Patient: Molly [name]  Procedure(s) Performed: TOTAL LAPAROSCOPIC HYSTERECTOMY WITH SALPINGECTOMY (Bilateral ) SENTINEL NODE BIOPSY (N/A )  Patient Location: PACU  Anesthesia Type:General  Level of Consciousness: sedated  Airway & Oxygen Therapy: Patient Spontanous Breathing and Patient connected to face mask oxygen  Post-op Assessment: Report given to RN and Post -op Vital signs reviewed and stable  Post vital signs: Reviewed and stable  Last Vitals:  Vitals Value Taken Time  BP    Temp    Pulse 69 10/06/2018 10:44 AM  Resp    SpO2 97 % 10/06/2018 10:44 AM  Vitals shown include unvalidated device data.  Last Pain:  Vitals:   10/06/18 0618  TempSrc: Tympanic  PainSc: 0-No pain         Complications: No apparent anesthesia complications

## 2018-10-06 NOTE — Anesthesia Postprocedure Evaluation (Signed)
Anesthesia Post Note  Patient: Molly [name]  Procedure(s) Performed: TOTAL LAPAROSCOPIC HYSTERECTOMY WITH BILATERAL SALPINGO OOPHORECTOMY (Bilateral ) SENTINEL NODE BIOPSY (N/A )  Patient location during evaluation: PACU Anesthesia Type: General Level of consciousness: awake and alert Pain management: pain level controlled Vital Signs Assessment: post-procedure vital signs reviewed and stable Respiratory status: spontaneous breathing, nonlabored ventilation, respiratory function stable and patient connected to nasal cannula oxygen Cardiovascular status: blood pressure returned to baseline and stable Postop Assessment: no apparent nausea or vomiting Anesthetic complications: no     Last Vitals:  Vitals:   10/06/18 1310 10/06/18 1445  BP: 127/67 (!) 116/57  Pulse: (!) 50 (!) 57  Resp: 16 14  Temp: (!) 35.9 C   SpO2: 100% 96%    Last Pain:  Vitals:   10/06/18 1445  TempSrc:   PainSc: [place] S

## 2018-10-06 NOTE — Anesthesia Post-op Follow-up Note (Signed)
Anesthesia QCDR form completed.        

## 2018-10-06 NOTE — Interval H&P Note (Signed)
History and Physical Interval Note:  08/03/7618 5:09 AM  Molly [name]  has presented today for surgery, with the diagnosis of endometrial cancer  The various methods of treatment have been discussed with the patient and family. After consideration of risks, benefits and other options for treatment, the patient has consented to  Procedure(s): TOTAL LAPAROSCOPIC HYSTERECTOMY WITH SALPINGOOPHORECTOMY (Bilateral) SENTINEL NODE BIOPSY (N/A) as a surgical intervention .  The patient's history has been reviewed, patient examined, no change in status, stable for surgery.  I have reviewed the patient's chart and labs.  Questions were answered to the patient's satisfaction.     Mellody Drown

## 2018-10-06 NOTE — Anesthesia Preprocedure Evaluation (Addendum)
Anesthesia Evaluation  Patient identified by MRN, date of birth, ID band Patient awake    Reviewed: Allergy & Precautions, NPO status , Patient's Chart, lab work & pertinent test results, reviewed documented beta blocker date and time   Airway Mallampati: III  TM Distance: >3 FB     Dental  (+) Chipped   Pulmonary asthma ,           Cardiovascular hypertension, Pt. on medications + CAD       Neuro/Psych PSYCHIATRIC DISORDERS Anxiety Depression CVA    GI/Hepatic GERD  Controlled,  Endo/Other  diabetes, Type 2Morbid obesity  Renal/GU      Musculoskeletal   Abdominal   Peds  Hematology   Anesthesia Other Findings EKG reviewed and ok.  Reproductive/Obstetrics                            Anesthesia Physical Anesthesia Plan  ASA: III  Anesthesia Plan: General   Post-op Pain Management:    Induction: Intravenous  PONV Risk Score and Plan:   Airway Management Planned: Oral ETT  Additional Equipment:   Intra-op Plan:   Post-operative Plan:   Informed Consent: I have reviewed the patients History and Physical, chart, labs and discussed the procedure including the risks, benefits and alternatives for the proposed anesthesia with the patient or authorized representative who has indicated his/her understanding and acceptance.     Plan Discussed with: CRNA  Anesthesia Plan Comments:         Anesthesia Quick Evaluation

## 2018-10-06 NOTE — Discharge Instructions (Signed)
AMBULATORY SURGERY  °DISCHARGE INSTRUCTIONS ° ° °1) The drugs that you were given will stay in your system until tomorrow so for the next 24 hours you should not: ° °A) Drive an automobile °B) Make any legal decisions °C) Drink any alcoholic beverage ° ° °2) You may resume regular meals tomorrow.  Today it is better to start with liquids and gradually work up to solid foods. ° °You may eat anything you prefer, but it is better to start with liquids, then soup and crackers, and gradually work up to solid foods. ° ° °3) Please notify your doctor immediately if you have any unusual bleeding, trouble breathing, redness and pain at the surgery site, drainage, fever, or pain not relieved by medication. ° ° ° °4) Additional Instructions: ° ° ° ° ° ° ° °Please contact your physician with any problems or Same Day Surgery at [phone], Monday through Friday 6 am to 4 pm, or [place] [facility] [place] Main number at [phone]. °

## 2018-10-06 NOTE — Op Note (Signed)
Operative Note   10/06/2018 11:28 AM  PRE-OP DIAGNOSIS: endometrial cancer grade 1, morbid obesity (BMI 44)   POST-OP DIAGNOSIS: endometrial cancer grade 1, morbid obesity BMI 44)  SURGEON: Surgeon(s) and Role:    Rubie Maid, MD  - Primary    Mellody Drown, MD - Assisting  ANESTHESIA: General ET  PROCEDURE: Procedure(s): TOTAL LAPAROSCOPIC HYSTERECTOMY WITH BILATERAL SALPINGECTOOPHORECTOMY BILATERAL PELVIC SENTINEL LYMPH NODE MAPPING AND BIOPSIES   ESTIMATED BLOOD LOSS: 50 ML  DRAINS: NONE  TOTAL IV FLUIDS: Per Anesthesia  SPECIMENS: Uterus, fallopian tubes, ovaries, sentinel nodes  COMPLICATIONS: none  DISPOSITION: PACU - hemodynamically stable.  CONDITION: stable  INDICATIONS: grade 1 endometrial cancer  FINDINGS: Uterus had a large fundal fibroid.  SLNs mapped in left obturator and external iliac nodes and right external iliac node.  The obturator node was a bit large.  The surgery was difficult due to the patient's morbid obesity, which required additional time due to difficulty with exposure and manipulation of instruments.   PROCEDURE IN DETAIL: After informed consent was obtained, the patient was taken to the operating room where anesthesia was obtained without difficulty. The patient was positioned in the dorsal lithotomy position in [place] and her arms were carefully tucked at her sides and the usual precautions were taken.  She was prepped and draped in normal sterile fashion.  Time-out was performed and a Foley catheter was placed into the bladder and the cervix was infiltrated with 4 ml of ICG fluorescent dye at 3 an 9 o'clock both superficial and deep injections. A standard VCare uterine manipulator was then placed in the uterus without incident.    An open Hasson technique was used to place an supraumbilical 09-NA baloon trocar under direct visualization. The laparoscope was introduced and CO2 gas was infused for pneumoperitoneum to a pressure of  15 mm Hg.  Right and left lateral 5-mm ports (airseal on left) and a 5-12 mm suprapubic port were placed under direct visualization of the laparoscope using an EndoStep technique.  Cytologic washings were obtained.  The patient was placed in Trendelenburg and the bowel was displaced up into the upper abdomen.  Omental adhesions around the Hasson trochar were taken down with the Ligasure with attention to avoid the transverse colon.   Round ligaments were divided on each side with the EndoShears and the retroperitoneal space was opened bilaterally.  The ureters were identified and preserved.  At this point the retroperitoneal spaces were developed and the lymphatic channels were mapped to each side.  The sentinel node on the right side was then identified, skeletonized and removed taking care not to injure the  obturator nerve, the ureter or the pelvic vasculature.  Similarly on the left side, the retroperitoneal spaces were developed, the lymphatic channels mapped to identify the sentinel node(s) and they were similarly removed with care to preserve the obturator nerve, the ureter and the pelvic vasculature. With hemostasis secured, the infundibulopelvic ligaments were skeletonized, sealed and divided with the LigaSure device.  A bladder flap was created and the bladder was dissected down off the lower uterine segment and cervix using endoshears and electrocautery.  The uterine arteries were skeletonized bilaterally, sealed and divided with the LigaSure device.  A colpotomy was performed circumferentially along the V-Care ring with electrocautery and the cervix was incised from the vagina, and we placed the specimen in an endocatch bag and the specimen was removed through the vagina.  A pneumo balloon was placed in the vagina and the vaginal cuff  was then closed in a running continuous fashion using the EndoStitch technique with 0 V-Lock suture with careful attention to include the vaginal cuff angles and the  vaginal mucosa within the closure.     Hemostasis was observed. The intraperitoneal pressure was dropped, and all planes of dissection, vascular pedicles and the vaginal cuff were found to be hemostatic.  The suprapubic trocar was removed and the fascia was closed with 0 Vicryl suture using the Endoclose technique. The lateral trocars were removed under visualization and the airstep port on the left was also closed with the Endoclose.   Before the umbilical trocar was removed the CO2 gas was released.  The fascia there was closed with 0 Vicryl suture in interrupted figure of eight technique.   The skin incisions were closed with subcuticular stitches and glue. The patient tolerated the procedure well.  Sponge, lap and needle counts were correct x2.  The patient was taken to recovery room in excellent condition.  Antibiotics: Ancef perioperatively  VTE prophylaxis: Lovonox ordered perioperatively.    Mellody Drown, MD

## 2018-10-07 ENCOUNTER — Encounter: Payer: Self-pay | Admitting: Obstetrics and Gynecology

## 2018-10-07 ENCOUNTER — Telehealth: Payer: Self-pay

## 2018-10-07 ENCOUNTER — Other Ambulatory Visit: Payer: Self-pay | Admitting: *Deleted

## 2018-10-07 MED ORDER — TRAMADOL HCL 50 MG PO TABS: 50.0000 mg | ORAL_TABLET | Freq: Four times a day (QID) | ORAL | 0 refills | Status: DC | PRN

## 2018-10-07 NOTE — Telephone Encounter (Signed)
error 

## 2018-10-07 NOTE — Telephone Encounter (Signed)
[facility] called and states patient cannot take Oxycodone, she is allergic to it. Tramadol was ordered yesterday, but it got printed and not sent to pharmacy. Alease Medina, NP is going to resubmit / escribe Tramadol today

## 2018-10-11 ENCOUNTER — Other Ambulatory Visit: Payer: Self-pay

## 2018-10-11 LAB — CYTOLOGY - NON PAP

## 2018-10-12 ENCOUNTER — Telehealth: Payer: Self-pay

## 2018-10-12 NOTE — Telephone Encounter (Signed)
Pathology sent to Dr. Fransisca Connors for review. Oncology Nurse Navigator Documentation  Navigator Location: [facility] (10/12/18 1400)   )Navigator Encounter Type: Other (10/12/18 1400) Telephone: Diagnostic Results (10/12/18 1400)                                                  Time Spent with Patient: 15 (10/12/18 1400)

## 2018-10-13 ENCOUNTER — Encounter: Payer: Medicare Other | Admitting: Obstetrics and Gynecology

## 2018-10-14 ENCOUNTER — Telehealth: Payer: Self-pay

## 2018-10-14 ENCOUNTER — Ambulatory Visit: Payer: Medicare Other | Admitting: Obstetrics and Gynecology

## 2018-10-14 ENCOUNTER — Encounter: Payer: Self-pay | Admitting: Obstetrics and Gynecology

## 2018-10-14 VITALS — BP 134/83 | HR 64 | Ht <= 58 in | Wt 214.5 lb

## 2018-10-14 DIAGNOSIS — C541 Malignant neoplasm of endometrium: Secondary | ICD-10-CM

## 2018-10-14 DIAGNOSIS — T8131XA Disruption of external operation (surgical) wound, not elsewhere classified, initial encounter: Secondary | ICD-10-CM

## 2018-10-14 DIAGNOSIS — Z4889 Encounter for other specified surgical aftercare: Secondary | ICD-10-CM

## 2018-10-14 DIAGNOSIS — Z90710 Acquired absence of both cervix and uterus: Secondary | ICD-10-CM

## 2018-10-14 NOTE — Telephone Encounter (Signed)
Called and notified Ms. Gervasi that Ct has been arranged for 10/25 at the [facility]. Arrival time 1415 with scheduled time of 1430. Instructed to pick up oral contrast at least 24 hours in advance. Radiology will instruct her on how/when to drink. Do not eat or drink anything for 4 hours before scan. Read back performed. Oncology Nurse Navigator Documentation  Navigator Location: [facility] (10/14/18 1500)   )Navigator Encounter Type: Telephone (10/14/18 1500) Telephone: [place] [facility] Call;Appt Confirmation/Clarification (10/14/18 1500)                                                  Time Spent with Patient: 15 (10/14/18 1500)

## 2018-10-14 NOTE — Progress Notes (Signed)
    OBSTETRICS/GYNECOLOGY POST-OPERATIVE CLINIC VISIT  Subjective:     Molly [name] is a 67 y.o. female who presents to the clinic 1 weeks status post total laparoscopic hysterectomy with bilateral salpingo-oophorectomy with pelvic lymph node dissection for newly diagnosed endometrial cancer. Eating a regular diet with difficulty. Bowel movements are normal. Pain is controlled with current analgesics. Medications being used: prescription NSAID's including ibuprofen (Motrin) and narcotic analgesics including hydrocodone/acetaminophen (Lorcet, Lortab, Norco, Vicodin).  Patient notes that supraumbilical incision began draining on Sunday, serosanguinous fluid.  Has slowed some. Denies redness or tenderness at the site.   The following portions of the patient's history were reviewed and updated as appropriate: allergies, current medications, past family history, past medical history, past social history, past surgical history and problem list.  Review of Systems Pertinent items noted in HPI and remainder of comprehensive ROS otherwise negative.    Objective:    BP 134/83   Pulse 64   Ht 4\' 10"  (1.473 m)   Wt 214 lb 8 oz (97.3 kg)   BMI 44.83 kg/m  General:  alert and no distress  Abdomen: soft, bowel sounds active, non-tender  Incision:   Supraumbilical incision with skin separation, small amount of serosanguinous fluid expressed.  All other incisions healing well, no drainage, no erythema, no hernia, no seroma, no swelling, no dehiscence, incision well approximated.     Pathology:  DIAGNOSIS:  A. UTERUS WITH CERVIX AND BILATERAL FALLOPIAN TUBES AND OVARIES; TOTAL  HYSTERECTOMY WITH BILATERAL SALPINGO-OOPHORECTOMY:  - ENDOMETRIAL ADENOCARCINOMA.  - SEE CANCER SUMMARY BELOW.  - CERVIX WITH NABOTHIAN CYSTS, CHRONIC CERVICITIS WITH SQUAMOUS  METAPLASIA, AND 0.9 CM ENDOCERVICAL POLYP.  - INACTIVE BACKGROUND ENDOMETRIUM WITH ENDOMETRIAL POLYP.  - MYOMETRIUM WITH ADENOMYOSIS AND  LEIOMYOMATA, LARGEST MEASURING 4.5 CM.  - UNREMARKABLE FALLOPIAN TUBES AND OVARIES.   B. SENTINEL LYMPH NODE, LEFT OBTURATOR; EXCISION:  - METASTATIC CARCINOMA INVOLVES ONE LYMPH NODE (1/1).   C. SENTINEL LYMPH NODE, LEFT EXTERNAL ILIAC; EXCISION:  - LYMPHOID TISSUE NOT PRESENT.  - NEGATIVE FOR MALIGNANCY.   D. SENTINEL LYMPH NODE, RIGHT EXTERNAL ILIAC; EXCISION:  - METASTATIC CARCINOMA INVOLVES ONE OF TWO LYMPH NODES (1/2).   Assessment:    Doing well postoperatively. S/p TLH with bilateral salpingo-oophorectomy, pelvic lymph node dissection for endometrial carcinoma.  Small area of wound separation   Plan:   1. Continue any current medications. 2. Wound care discussed.  Steri-strips and dressing with Tegaderm placed on supraumbilical incision. All other incisions still covered with Dermabond, healing well. Can remove dressing in 4-5 days. 3. Operative findings again reviewed. Pathology report reviewed however not discussed, will be discussed in detail with GYN Oncologist.  4. Activity restrictions: no bending, stooping, or squatting and no lifting more than 10-15 pounds 5. Anticipated return to work: not applicable. 6. Follow up: 6 months for cancer surveillance.     Rubie Maid, MD [facility]

## 2018-10-14 NOTE — Telephone Encounter (Signed)
Left message for Molly [name] to return call. We would like to arrange CT CAP along with medical/radiation oncology referral for her endometrial cancer. Oncology Nurse Navigator Documentation  Navigator Location: [facility] (10/14/18 1200)   )Navigator Encounter Type: Telephone (10/14/18 1200) Telephone: Outgoing Call;Diagnostic Results (10/14/18 1200)                                                  Time Spent with Patient: 15 (10/14/18 1200)

## 2018-10-14 NOTE — Telephone Encounter (Signed)
Call placed to Molly [name]. Notified of pathology results in detail. Educated that she will need chemotherapy and plus/minus radiation. We will arrange for CT CAP along with medical/radiation oncology referral at the request of Dr. Fransisca Connors. Orders placed. All questions answered. Encouraged Molly [name] and her daughters to call with any further questions.  Tumor board 10/13/18 This case was presented at tumor board. Recommend carbo/taxol and discuss pro's/cons of radiation. Dr. Fransisca Connors recommends starting with 3 cycles of carbo/taxol followed by pelvic radiation to include a low aortic field, followed by 3 more cycles of carbo/taxol.   DIAGNOSIS:  A. UTERUS WITH CERVIX AND BILATERAL FALLOPIAN TUBES AND OVARIES; TOTAL  HYSTERECTOMY WITH BILATERAL SALPINGO-OOPHORECTOMY:  - ENDOMETRIAL ADENOCARCINOMA.  - SEE CANCER SUMMARY BELOW.  - CERVIX WITH NABOTHIAN CYSTS, CHRONIC CERVICITIS WITH SQUAMOUS  METAPLASIA, AND 0.9 CM ENDOCERVICAL POLYP.  - INACTIVE BACKGROUND ENDOMETRIUM WITH ENDOMETRIAL POLYP.  - MYOMETRIUM WITH ADENOMYOSIS AND LEIOMYOMATA, LARGEST MEASURING 4.5 CM.  - UNREMARKABLE FALLOPIAN TUBES AND OVARIES.   B. SENTINEL LYMPH NODE, LEFT OBTURATOR; EXCISION:  - METASTATIC CARCINOMA INVOLVES ONE LYMPH NODE (1/1).   C. SENTINEL LYMPH NODE, LEFT EXTERNAL ILIAC; EXCISION:  - LYMPHOID TISSUE NOT PRESENT.  - NEGATIVE FOR MALIGNANCY.   D. SENTINEL LYMPH NODE, RIGHT EXTERNAL ILIAC; EXCISION:  - METASTATIC CARCINOMA INVOLVES ONE OF TWO LYMPH NODES (1/2).   Oncology Nurse Navigator Documentation  Navigator Location: [facility] (10/14/18 1300)   )Navigator Encounter Type: Telephone (10/14/18 1300) Telephone: Outgoing Call;Diagnostic Results (10/14/18 1300)                                                  Time Spent with Patient: 30 (10/14/18 1300)

## 2018-10-14 NOTE — Progress Notes (Signed)
Pt is present today for a follow up appt after surgery. Pt stated that she is still in pain no other problems.

## 2018-10-18 ENCOUNTER — Telehealth: Payer: Self-pay

## 2018-10-18 NOTE — Telephone Encounter (Signed)
Called and notified Ms. Gootee of her appointment to see Dr. Janese Banks this Thursday, 10/24 at 1100. Read back performed. Oncology Nurse Navigator Documentation  Navigator Location: [facility] (10/18/18 1300)   )Navigator Encounter Type: Telephone (10/18/18 1300) Telephone: [place] Call (10/18/18 1300)                                                  Time Spent with Patient: 15 (10/18/18 1300)

## 2018-10-20 ENCOUNTER — Ambulatory Visit (INDEPENDENT_AMBULATORY_CARE_PROVIDER_SITE_OTHER): Payer: Medicare Other | Admitting: Family Medicine

## 2018-10-20 ENCOUNTER — Encounter: Payer: Self-pay | Admitting: Family Medicine

## 2018-10-20 VITALS — BP 132/72 | HR 64 | Temp 97.7°F | Resp 16 | Ht 59.0 in | Wt 212.2 lb

## 2018-10-20 DIAGNOSIS — C541 Malignant neoplasm of endometrium: Secondary | ICD-10-CM

## 2018-10-20 DIAGNOSIS — R195 Other fecal abnormalities: Secondary | ICD-10-CM | POA: Diagnosis not present

## 2018-10-20 DIAGNOSIS — F330 Major depressive disorder, recurrent, mild: Secondary | ICD-10-CM | POA: Diagnosis not present

## 2018-10-20 DIAGNOSIS — R7303 Prediabetes: Secondary | ICD-10-CM

## 2018-10-20 MED ORDER — ESCITALOPRAM OXALATE 5 MG PO TABS: 5.0000 mg | ORAL_TABLET | Freq: Every day | ORAL | 0 refills | Status: DC

## 2018-10-20 NOTE — Progress Notes (Signed)
Name: Molly [name]   MRN: [id number]    DOB: 05-Mar-1951   Date:10/20/2018       Progress Note  Subjective  Chief Complaint  Chief Complaint  Patient presents with  . Follow-up    6 wk f/u    HPI  Endometrial cancer: s/p hysterectomy, she had post-op follow up with Dr. Marcelline Mates. She is also going to see Dr. Janese Banks and Dr. Baruch Gouty . She states pelvic pain is under control, but has noticed mild left lower quadrant pain over the past couple of days. She was also told to take colace daily starting prior to surgery, but now stools are loose and present after meals. She denies blood in stools. No nausea or vomiting. Advised her to stop colace and monitor for now. Follow up with surgeon is no improvement of symptoms. She has lack of appetite and has not resumed metformin for pre-diabetes ( advised to hold mediation for now)   Depression: started prior to surgery and she is doing well on lexapro 5 mg, 10 mg dose was too high and made her feel like a zombie. She denies crying spells. Phq9 was normal today. She states family and her religious congregation has bene very supportive.   Patient Active Problem List   Diagnosis Date Noted  . Umbilical hernia without obstruction and without gangrene   . Atherosclerosis of right coronary artery 11/10/2017  . Atherosclerosis of abdominal aorta ([place]) 11/10/2017  . Prediabetes 10/02/2017  . Pain in limb 03/31/2017  . Perennial allergic rhinitis with seasonal variation 05/14/2016  . Asthma, mild intermittent, well-controlled 05/14/2016  . Hypertension goal BP (blood pressure) < 140/90 12/10/2015  . Cardiomyopathy due to hypertension ([place]) 12/10/2015  . History of CVA (cerebrovascular accident) 12/10/2015  . Hyperlipidemia LDL goal <70 12/10/2015  . Statin intolerance 12/10/2015    Past Surgical History:  Procedure Laterality Date  . CHOLECYSTECTOMY    . COLONOSCOPY WITH PROPOFOL N/A 01/08/2017   Procedure: COLONOSCOPY WITH PROPOFOL;  Surgeon: [name], MD;  Location: [facility] ENDOSCOPY;  Service: Endoscopy;  Laterality: N/A;  . COLONOSCOPY WITH PROPOFOL N/A 05/05/2018   Procedure: COLONOSCOPY WITH PROPOFOL;  Surgeon: [name], MD;  Location: [facility] ENDOSCOPY;  Service: Gastroenterology;  Laterality: N/A;  . HERNIA REPAIR  [id number]   umbilical  . SENTINEL NODE BIOPSY N/A 10/06/2018   Procedure: SENTINEL NODE BIOPSY;  Surgeon: Mellody Drown, MD;  Location: [facility] ORS;  Service: Gynecology;  Laterality: N/A;  . UMBILICAL HERNIA REPAIR N/A 11/17/2017   Procedure: HERNIA REPAIR UMBILICAL ADULT;  Surgeon: Jules Husbands, MD;  Location: [facility] ORS;  Service: General;  Laterality: N/A;    Family History  Problem Relation Age of Onset  . Stroke Mother   . Hypertension Mother   . Dementia Mother   . Alzheimer's disease Mother   . Gout Father   . Asthma Father   . Hypertension Father   . Dementia Father   . Healthy Sister   . Stroke Brother   . Alzheimer's disease Brother   . Healthy Daughter   . Hypertension Brother   . Healthy Brother   . Cancer Paternal Grandmother   . Healthy Sister   . Healthy Sister   . Hypertension Sister   . Hypertension Sister   . Stroke Brother   . Alzheimer's disease Brother   . Stroke Brother   . Hypertension Brother   . Stroke Brother   . Hypertension Brother   . Healthy Brother   . Healthy Brother   .  Hypertension Daughter   . Breast cancer Neg Hx     Social History   Socioeconomic History  . Marital status: Divorced    Spouse name: Not on file  . Number of children: 2  . Years of education: some college  . Highest education level: 12th grade  Occupational History  . 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 Occupation: currently a Careers adviser  . Financial resource strain: Not hard at all  . Food insecurity:    Worry: Never true    Inability: Never true  . Transportation needs:    Medical: No    Non-medical: No  Tobacco Use  . Smoking status: Never  Smoker  . Smokeless tobacco: Never Used  . Tobacco comment: smoking cessation materials not required  Substance and Sexual Activity  . Alcohol use: No    Alcohol/week: 0.0 standard drinks  . Drug use: No  . Sexual activity: Never  Lifestyle  . Physical activity:    Days per week: 0 days    Minutes per session: 0 min  . Stress: Not at all  Relationships  . Social connections:    Talks on phone: More than three times a week    Gets together: Three times a week    Attends religious service: More than 4 times per year    Active member of club or organization: Yes    Attends meetings of clubs or organizations: More than 4 times per year    Relationship status: Divorced  . Intimate partner violence:    Fear of current or ex partner: No    Emotionally abused: No    Physically abused: No    Forced sexual activity: No  Other Topics Concern  . Not on file  Social History Narrative  . Not on file     Current Outpatient Medications:  .  acetaminophen (TYLENOL) 500 MG tablet, Take 2 tablets (1,000 mg total) by mouth every 6 (six) hours as needed for mild pain or moderate pain., Disp: 30 tablet, Rfl: 0 .  albuterol (PROVENTIL HFA;VENTOLIN HFA) 108 (90 Base) MCG/ACT inhaler, Inhale 2 puffs into the lungs every 4 (four) hours as needed for wheezing or shortness of breath., Disp: 1 Inhaler, Rfl: 0 .  calcium citrate-vitamin D (CITRACAL+D) 315-200 MG-UNIT tablet, Take 1 tablet by mouth daily., Disp: , Rfl:  .  cetirizine (ZYRTEC) 10 MG tablet, Take 1 tablet (10 mg total) by mouth daily as needed. (Patient taking differently: Take 10 mg by mouth daily as needed for allergies. ), Disp: 90 tablet, Rfl: 1 .  diclofenac sodium (VOLTAREN) 1 % GEL, Apply 2 g topically 3 (three) times daily as needed (pain)., Disp: 100 g, Rfl: 1 .  docusate sodium (COLACE) 100 MG capsule, Take 1 capsule (100 mg total) by mouth 2 (two) times daily as needed for mild constipation., Disp: 30 capsule, Rfl: 2 .   escitalopram (LEXAPRO) 10 MG tablet, Take 1 tablet (10 mg total) by mouth daily. (Patient taking differently: Take 5 mg by mouth daily. ), Disp: 30 tablet, Rfl: 1 .  ezetimibe (ZETIA) 10 MG tablet, Take 1 tablet (10 mg total) by mouth daily., Disp: 90 tablet, Rfl: 1 .  furosemide (LASIX) 40 MG tablet, Take 1 tablet (40 mg total) by mouth daily., Disp: 90 tablet, Rfl: 1 .  ketoconazole (NIZORAL) 2 % cream, Apply 1 application topically daily as needed for irritation. On abdominal fold prn, Disp: 60  g, Rfl: 0 .  metFORMIN (GLUCOPHAGE-XR) 750 MG 24 hr tablet, Take 1 tablet (750 mg total) by mouth daily with breakfast. (Patient taking differently: Take 750 mg by mouth every other day. ), Disp: 90 tablet, Rfl: 1 .  olmesartan-hydrochlorothiazide (BENICAR HCT) 20-12.5 MG tablet, Take 1 tablet by mouth daily., Disp: 90 tablet, Rfl: 1 .  traMADol (ULTRAM) 50 MG tablet, Take 1 tablet (50 mg total) by mouth every 6 (six) hours as needed., Disp: 30 tablet, Rfl: 0  Allergies  Allergen Reactions  . Nsaids Hives and Rash  . Statins Other (See Comments)    Joint pains  . Victoza [Liraglutide] Other (See Comments)    pancreatitis  . Oxycodone Itching and Swelling  . Crestor [Rosuvastatin Calcium] Rash    I personally reviewed active problem list, medication list, allergies, family history, social history with the patient/caregiver today.   ROS  Constitutional: Negative for fever or weight change.  Respiratory: Negative for cough and shortness of breath.   Cardiovascular: Negative for chest pain or palpitations.  Gastrointestinal: Positive  For mild  abdominal pain and bowel changes.  Musculoskeletal: Negative for gait problem or joint swelling.  Skin: Negative for rash.  Neurological: Negative for dizziness or headache.  No other specific complaints in a complete review of systems (except as listed in HPI above).  Objective  Vitals:   10/20/18 1416  BP: 132/72  Pulse: 64  Resp: 16  Temp: 97.7  F (36.5 C)  TempSrc: Oral  SpO2: 94%  Weight: 212 lb 3.2 oz (96.3 kg)  Height: 4\' 11"  (1.499 m)    Body mass index is 42.86 kg/m.  Physical Exam  Constitutional: Patient appears well-developed and well-nourished. Obese No distress.  HEENT: head atraumatic, normocephalic,  neck supple, throat within normal limits Cardiovascular: Normal rate, regular rhythm and normal heart sounds.  No murmur heard. No BLE edema. Pulmonary/Chest: Effort normal and breath sounds normal. No respiratory distress. Abdominal: Soft.  There is no tenderness to palpation, incisions in good aspect  Psychiatric: Patient has a normal mood and affect. behavior is normal. Judgment and thought content normal.    PHQ2/9: Depression screen [facility] 2/9 10/20/2018 09/07/2018 05/07/2018 04/06/2018 11/10/2017  Decreased Interest 0 1 1 0 0  Down, Depressed, Hopeless 0 1 0 0 0  PHQ - 2 Score 0 2 1 0 0  Altered sleeping 0 1 0 0 -  Tired, decreased energy 0 2 2 0 -  Change in appetite 0 2 0 0 -  Feeling bad or failure about yourself  0 0 0 0 -  Trouble concentrating 0 1 1 0 -  Moving slowly or fidgety/restless 0 0 0 0 -  Suicidal thoughts 0 0 0 0 -  PHQ-9 Score 0 8 4 0 -  Difficult doing work/chores Not difficult at all Somewhat difficult Not difficult at all Not difficult at all -     Fall Risk: Fall Risk  10/20/2018 09/07/2018 05/07/2018 04/06/2018 11/10/2017  Falls in the past year? No No No No No  Risk for fall due to : - - - Impaired vision;Medication side effect -  Risk for fall due to: Comment - - - blurred vision, wears eyeglasses -     Functional Status Survey: Is the patient deaf or have difficulty hearing?: No Does the patient have difficulty seeing, even when wearing glasses/contacts?: Yes(glasses) Does the patient have difficulty concentrating, remembering, or making decisions?: Yes(remember-has a lot on her plate) Does the patient have difficulty walking or  climbing stairs?: No Does the patient have  difficulty dressing or bathing?: No Does the patient have difficulty doing errands alone such as visiting a doctor's office or shopping?: No    Assessment & Plan  1. Mild recurrent major depression (HCC)  - escitalopram (LEXAPRO) 5 MG tablet; Take 1 tablet (5 mg total) by mouth daily.  Dispense: 90 tablet; Refill: 0   2. Endometrial carcinoma ([place])  She is hopefull   3. Loose stools  Stop colace  4. Prediabetes  Since not eating much , hold metformin for now

## 2018-10-21 ENCOUNTER — Ambulatory Visit: Payer: Medicare Other | Admitting: Oncology

## 2018-10-21 ENCOUNTER — Encounter: Payer: Self-pay | Admitting: Obstetrics and Gynecology

## 2018-10-22 ENCOUNTER — Ambulatory Visit
Admission: RE | Admit: 2018-10-22 | Discharge: 2018-10-22 | Disposition: A | Discharge status: Home / Self Care | Payer: Medicare Other | Source: Ambulatory Visit | Attending: Obstetrics and Gynecology | Admitting: Obstetrics and Gynecology

## 2018-10-22 DIAGNOSIS — R918 Other nonspecific abnormal finding of lung field: Secondary | ICD-10-CM | POA: Diagnosis not present

## 2018-10-22 DIAGNOSIS — C541 Malignant neoplasm of endometrium: Secondary | ICD-10-CM | POA: Insufficient documentation

## 2018-10-22 MED ORDER — IOPAMIDOL (ISOVUE-300) INJECTION 61%
100.0000 mL | Freq: Once | INTRAVENOUS | Status: AC | PRN
  Administered 2018-10-22: 100 mL via INTRAVENOUS

## 2018-10-25 ENCOUNTER — Other Ambulatory Visit: Payer: Self-pay

## 2018-10-25 ENCOUNTER — Other Ambulatory Visit: Payer: Self-pay | Admitting: *Deleted

## 2018-10-25 ENCOUNTER — Ambulatory Visit
Admission: RE | Admit: 2018-10-25 | Discharge: 2018-10-25 | Disposition: A | Discharge status: Home / Self Care | Payer: Medicare Other | Source: Ambulatory Visit | Attending: Radiation Oncology | Admitting: Radiation Oncology

## 2018-10-25 ENCOUNTER — Encounter: Payer: Self-pay | Admitting: Radiation Oncology

## 2018-10-25 VITALS — BP 154/79 | HR 55 | Temp 97.4°F | Wt 210.5 lb

## 2018-10-25 DIAGNOSIS — J45909 Unspecified asthma, uncomplicated: Secondary | ICD-10-CM | POA: Diagnosis not present

## 2018-10-25 DIAGNOSIS — Z79899 Other long term (current) drug therapy: Secondary | ICD-10-CM | POA: Diagnosis not present

## 2018-10-25 DIAGNOSIS — Z8673 Personal history of transient ischemic attack (TIA), and cerebral infarction without residual deficits: Secondary | ICD-10-CM | POA: Insufficient documentation

## 2018-10-25 DIAGNOSIS — Z90722 Acquired absence of ovaries, bilateral: Secondary | ICD-10-CM | POA: Diagnosis not present

## 2018-10-25 DIAGNOSIS — C541 Malignant neoplasm of endometrium: Secondary | ICD-10-CM

## 2018-10-25 DIAGNOSIS — Z7984 Long term (current) use of oral hypoglycemic drugs: Secondary | ICD-10-CM | POA: Diagnosis not present

## 2018-10-25 DIAGNOSIS — I10 Essential (primary) hypertension: Secondary | ICD-10-CM | POA: Diagnosis not present

## 2018-10-25 DIAGNOSIS — Z90710 Acquired absence of both cervix and uterus: Secondary | ICD-10-CM | POA: Insufficient documentation

## 2018-10-25 DIAGNOSIS — E785 Hyperlipidemia, unspecified: Secondary | ICD-10-CM | POA: Diagnosis not present

## 2018-10-25 DIAGNOSIS — F329 Major depressive disorder, single episode, unspecified: Secondary | ICD-10-CM | POA: Insufficient documentation

## 2018-10-25 DIAGNOSIS — F418 Other specified anxiety disorders: Secondary | ICD-10-CM | POA: Diagnosis not present

## 2018-10-25 DIAGNOSIS — I509 Heart failure, unspecified: Secondary | ICD-10-CM | POA: Insufficient documentation

## 2018-10-25 NOTE — Consult Note (Signed)
NEW PATIENT EVALUATION  Name: Molly [name]  MRN: [id number]  Date:   10/25/2018     DOB: [birth year]   This 67 y.o. female patient presents to the clinic for initial evaluation of stage IIIc 2 (T1 N2 M0).endometrial carcinoma status post TAH/BSO  REFERRING PHYSICIAN: Steele Sizer, MD  CHIEF COMPLAINT:  Chief Complaint  Patient presents with  . Cancer    initial eval    DIAGNOSIS: The encounter diagnosis was Endometrial carcinoma ([place]).   PREVIOUS INVESTIGATIONS:  CT scans reviewed Clinical notes reviewed Pathology report [id number]  HPI: patient is a35 year old female who presented with postmenopausal bleeding with history of grade 3 uterine prolapse.she underwent ultrasound revealing most likely fibroids although persisted with vaginal bleeding and underwent endometrial biopsy which showed endometrioid adenocarcinoma FIGO grade 1. She does have bilateral lower extremity edema. She was taken to the operating room by Dr. Limmie Patricia underwent TAH/BSO. Uterus showed endometrial adenocarcinoma with 6 mm of myometrial invasion out of 18 mm representing 33% myometrial invasion. There was lymphovascular invasion. There was atypical peritoneal cytology.there were 2 pelvic lymph nodes with macro metastatic disease One lymph node was from right external iliac other lymph node that was positive was left obturator. She went on to have CT scanning show low left para-aortic and bilateral external iliac lymphadenopathy compatible with metastatic disease she also had incidentally 2 tiny pulmonary nodules largest measuring 2 mm. I have ordered a PET scan. She is seen today for evaluation she is doing well she specifically spell denies any further vaginal bleeding specifically denies any increased lower urinary tract symptoms or diarrhea. She's never having no abdominal or pelvic pain at this time.  PLANNED TREATMENT REGIMEN: systemic chemotherapy with adjuvant radiation therapy to pelvic  and perioral lymph nodes  PAST MEDICAL HISTORY:  has a past medical history of Anxiety, Asthma, Cardiomyopathy ([place]), Complication of anesthesia, Depression, Endometrial cancer ([place]) (08/2018), GERD (gastroesophageal reflux disease), History of CVA (cerebrovascular accident) (12/10/2015), Hyperlipidemia LDL goal <70 (12/10/2015), Hypertension, Prediabetes (10/02/2017), and Stroke ([place]) (1990).    PAST SURGICAL HISTORY:  Past Surgical History:  Procedure Laterality Date  . CHOLECYSTECTOMY    . COLONOSCOPY WITH PROPOFOL N/A 01/08/2017   Procedure: COLONOSCOPY WITH PROPOFOL;  Surgeon: [name], MD;  Location: [facility] ENDOSCOPY;  Service: Endoscopy;  Laterality: N/A;  . COLONOSCOPY WITH PROPOFOL N/A 05/05/2018   Procedure: COLONOSCOPY WITH PROPOFOL;  Surgeon: [name], MD;  Location: [facility] ENDOSCOPY;  Service: Gastroenterology;  Laterality: N/A;  . HERNIA REPAIR  [id number]   umbilical  . SENTINEL NODE BIOPSY N/A 10/06/2018   Procedure: SENTINEL NODE BIOPSY;  Surgeon: Mellody Drown, MD;  Location: [facility] ORS;  Service: Gynecology;  Laterality: N/A;  . UMBILICAL HERNIA REPAIR N/A 11/17/2017   Procedure: HERNIA REPAIR UMBILICAL ADULT;  Surgeon: Jules Husbands, MD;  Location: [facility] ORS;  Service: General;  Laterality: N/A;    FAMILY HISTORY: family history includes Alzheimer's disease in her brother, brother, and mother; Asthma in her father; Cancer in her paternal grandmother; Dementia in her father and mother; Gout in her father; Healthy in her brother, brother, brother, daughter, sister, sister, and sister; Hypertension in her brother, brother, brother, daughter, father, mother, sister, and sister; Stroke in her brother, brother, brother, brother, and mother.  SOCIAL HISTORY:  reports that she has never smoked. She has never used smokeless tobacco. She reports that she does not drink alcohol or use drugs.  ALLERGIES: Nsaids; Statins; Victoza [liraglutide]; Oxycodone; and Crestor [rosuvastatin  calcium]  MEDICATIONS:  Current Outpatient  Medications  Medication Sig Dispense Refill  . acetaminophen (TYLENOL) 500 MG tablet Take 2 tablets (1,000 mg total) by mouth every 6 (six) hours as needed for mild pain or moderate pain. 30 tablet 0  . albuterol (PROVENTIL HFA;VENTOLIN HFA) 108 (90 Base) MCG/ACT inhaler Inhale 2 puffs into the lungs every 4 (four) hours as needed for wheezing or shortness of breath. 1 Inhaler 0  . calcium citrate-vitamin D (CITRACAL+D) 315-200 MG-UNIT tablet Take 1 tablet by mouth daily.    . cetirizine (ZYRTEC) 10 MG tablet Take 1 tablet (10 mg total) by mouth daily as needed. (Patient taking differently: Take 10 mg by mouth daily as needed for allergies. ) 90 tablet 1  . diclofenac sodium (VOLTAREN) 1 % GEL Apply 2 g topically 3 (three) times daily as needed (pain). 100 g 1  . docusate sodium (COLACE) 100 MG capsule Take 1 capsule (100 mg total) by mouth 2 (two) times daily as needed for mild constipation. 30 capsule 2  . escitalopram (LEXAPRO) 5 MG tablet Take 1 tablet (5 mg total) by mouth daily. 90 tablet 0  . ezetimibe (ZETIA) 10 MG tablet Take 1 tablet (10 mg total) by mouth daily. 90 tablet 1  . furosemide (LASIX) 40 MG tablet Take 1 tablet (40 mg total) by mouth daily. 90 tablet 1  . ketoconazole (NIZORAL) 2 % cream Apply 1 application topically daily as needed for irritation. On abdominal fold prn 60 g 0  . metFORMIN (GLUCOPHAGE-XR) 750 MG 24 hr tablet Take 1 tablet (750 mg total) by mouth daily with breakfast. (Patient taking differently: Take 750 mg by mouth every other day. ) 90 tablet 1  . olmesartan-hydrochlorothiazide (BENICAR HCT) 20-12.5 MG tablet Take 1 tablet by mouth daily. 90 tablet 1  . traMADol (ULTRAM) 50 MG tablet Take 1 tablet (50 mg total) by mouth every 6 (six) hours as needed. 30 tablet 0   No current facility-administered medications for this encounter.     ECOG PERFORMANCE STATUS:  0 - Asymptomatic  REVIEW OF SYSTEMS:  Patient denies  any weight loss, fatigue, weakness, fever, chills or night sweats. Patient denies any loss of vision, blurred vision. Patient denies any ringing  of the ears or hearing loss. No irregular heartbeat. Patient denies heart murmur or history of fainting. Patient denies any chest pain or pain radiating to her upper extremities. Patient denies any shortness of breath, difficulty breathing at night, cough or hemoptysis. Patient denies any swelling in the lower legs. Patient denies any nausea vomiting, vomiting of blood, or coffee ground material in the vomitus. Patient denies any stomach pain. Patient states has had normal bowel movements no significant constipation or diarrhea. Patient denies any dysuria, hematuria or significant nocturia. Patient denies any problems walking, swelling in the joints or loss of balance. Patient denies any skin changes, loss of hair or loss of weight. Patient denies any excessive worrying or anxiety or significant depression. Patient denies any problems with insomnia. Patient denies excessive thirst, polyuria, polydipsia. Patient denies any swollen glands, patient denies easy bruising or easy bleeding. Patient denies any recent infections, allergies or URI. Patient "s visual fields have not changed significantly in recent time.    PHYSICAL EXAM: BP (!) 154/79 (BP Location: Right Arm, Patient Position: Sitting)   Pulse (!) 55   Temp (!) 97.4 F (36.3 C) (Tympanic)   Wt 210 lb 8.6 oz (95.5 kg)   BMI 42.52 kg/m  Well-developed well-nourished patient in NAD. HEENT reveals PERLA, EOMI, discs not  visualized.  Oral cavity is clear. No oral mucosal lesions are identified. Neck is clear without evidence of cervical or supraclavicular adenopathy. Lungs are clear to A&P. Cardiac examination is essentially unremarkable with regular rate and rhythm without murmur rub or thrill. Abdomen is benign with no organomegaly or masses noted. Motor sensory and DTR levels are equal and symmetric in the  upper and lower extremities. Cranial nerves II through XII are grossly intact. Proprioception is intact. No peripheral adenopathy or edema is identified. No motor or sensory levels are noted. Crude visual fields are within normal range.  LABORATORY DATA: pathology reports reviewed    RADIOLOGY RESULTS:CT scans of abdomen chest and pelvis reviewed   IMPRESSION: stage IIIc 2 endometrial carcinoma in 67 year old female PET CT scan pending  PLAN: at this time patient will most likely undergo systemic chemotherapy and she has an appointment with Dr. Janese Banks later this week. I would also recommend radiation therapy to her pelvic and periaortic nodes up to 4500 cGy. I believe I can dose painting slightly higher dose to her involved lymph nodes and I've ordered a PET CT scan for treatment planning purposes in the future. I also would boost her vaginal cuffwith high dose rate remote afterloading based on lymphovascular invasion and high likelihood of vaginal apex recurrence of her locally advanced cancer.Risks and benefits of radiation including increased lower urinary tract symptoms, diarrhea fatigue alteration of blood counts skin reaction all were discussed in detail. Will make further treatment decisions after she is evaluated by medical oncology. Patient and daughter both seem to comprehend my treatment plan well. I would like to take this opportunity to thank you for allowing me to participate in the care of your patient.Noreene Filbert, MD

## 2018-10-28 ENCOUNTER — Encounter: Payer: Self-pay | Admitting: Oncology

## 2018-10-28 ENCOUNTER — Other Ambulatory Visit: Payer: Self-pay | Admitting: *Deleted

## 2018-10-28 ENCOUNTER — Inpatient Hospital Stay: Payer: Medicare Other | Attending: Oncology | Admitting: Oncology

## 2018-10-28 ENCOUNTER — Inpatient Hospital Stay: Payer: Medicare Other

## 2018-10-28 VITALS — BP 165/75 | HR 50 | Temp 97.9°F | Resp 18 | Ht 59.0 in | Wt 209.5 lb

## 2018-10-28 DIAGNOSIS — I10 Essential (primary) hypertension: Secondary | ICD-10-CM

## 2018-10-28 DIAGNOSIS — F419 Anxiety disorder, unspecified: Secondary | ICD-10-CM | POA: Diagnosis not present

## 2018-10-28 DIAGNOSIS — C541 Malignant neoplasm of endometrium: Secondary | ICD-10-CM

## 2018-10-28 DIAGNOSIS — C778 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: Secondary | ICD-10-CM | POA: Diagnosis not present

## 2018-10-28 DIAGNOSIS — Z7189 Other specified counseling: Secondary | ICD-10-CM

## 2018-10-28 DIAGNOSIS — Z8673 Personal history of transient ischemic attack (TIA), and cerebral infarction without residual deficits: Secondary | ICD-10-CM | POA: Insufficient documentation

## 2018-10-28 DIAGNOSIS — Z809 Family history of malignant neoplasm, unspecified: Secondary | ICD-10-CM

## 2018-10-28 LAB — CBC WITH DIFFERENTIAL/PLATELET
ABS IMMATURE GRANULOCYTES: 0.01 10*3/uL (ref 0.00–0.07)
Basophils Absolute: 0 10*3/uL (ref 0.0–0.1)
Basophils Relative: 1 %
Eosinophils Absolute: 0.1 10*3/uL (ref 0.0–0.5)
Eosinophils Relative: 3 %
HCT: 34.6 % — ABNORMAL LOW (ref 36.0–46.0)
HEMOGLOBIN: 11.1 g/dL — AB (ref 12.0–15.0)
IMMATURE GRANULOCYTES: 0 %
Lymphocytes Relative: 42 %
Lymphs Abs: 2.1 10*3/uL (ref 0.7–4.0)
MCH: 28.7 pg (ref 26.0–34.0)
MCHC: 32.1 g/dL (ref 30.0–36.0)
MCV: 89.4 fL (ref 80.0–100.0)
MONO ABS: 0.3 10*3/uL (ref 0.1–1.0)
Monocytes Relative: 7 %
NEUTROS ABS: 2.3 10*3/uL (ref 1.7–7.7)
NRBC: 0 % (ref 0.0–0.2)
Neutrophils Relative %: 47 %
Platelets: 340 10*3/uL (ref 150–400)
RBC: 3.87 MIL/uL (ref 3.87–5.11)
RDW: 12.5 % (ref 11.5–15.5)
WBC: 4.9 10*3/uL (ref 4.0–10.5)

## 2018-10-28 LAB — COMPREHENSIVE METABOLIC PANEL
ALT: 31 U/L (ref 0–44)
AST: 24 U/L (ref 15–41)
Albumin: 4.2 g/dL (ref 3.5–5.0)
Alkaline Phosphatase: 87 U/L (ref 38–126)
Anion gap: 8 (ref 5–15)
BILIRUBIN TOTAL: 0.6 mg/dL (ref 0.3–1.2)
BUN: 13 mg/dL (ref 8–23)
CO2: 27 mmol/L (ref 22–32)
Calcium: 9.6 mg/dL (ref 8.9–10.3)
Chloride: 106 mmol/L (ref 98–111)
Creatinine, Ser: 0.82 mg/dL (ref 0.44–1.00)
Glucose, Bld: 103 mg/dL — ABNORMAL HIGH (ref 70–99)
POTASSIUM: 3.8 mmol/L (ref 3.5–5.1)
Sodium: 141 mmol/L (ref 135–145)
Total Protein: 7.6 g/dL (ref 6.5–8.1)

## 2018-10-28 LAB — PROTIME-INR
INR: 1.01
PROTHROMBIN TIME: 13.2 s (ref 11.4–15.2)

## 2018-10-28 LAB — OPHTHALMOLOGY REPORT-SCANNED

## 2018-10-28 NOTE — Progress Notes (Signed)
Request for HER-2 sent to pathology with confirmation of receipt. Oncology Nurse Navigator Documentation  Navigator Location: [facility] (10/28/18 1000)   )Navigator Encounter Type: Letter/Fax/Email (10/28/18 1000)                                                    Time Spent with Patient: 15 (10/28/18 1000)

## 2018-10-29 ENCOUNTER — Encounter
Admission: RE | Admit: 2018-10-29 | Discharge: 2018-10-29 | Disposition: A | Discharge status: Home / Self Care | Payer: Medicare Other | Source: Ambulatory Visit | Attending: Radiation Oncology | Admitting: Radiation Oncology

## 2018-10-29 DIAGNOSIS — C541 Malignant neoplasm of endometrium: Secondary | ICD-10-CM | POA: Diagnosis not present

## 2018-10-29 LAB — GLUCOSE, CAPILLARY: GLUCOSE-CAPILLARY: 94 mg/dL (ref 70–99)

## 2018-10-29 MED ORDER — FLUDEOXYGLUCOSE F - 18 (FDG) INJECTION
10.9000 | Freq: Once | INTRAVENOUS | Status: AC | PRN
  Administered 2018-10-29: 10.732 via INTRAVENOUS

## 2018-10-29 MED ORDER — LIDOCAINE-PRILOCAINE 2.5-2.5 % EX CREA: TOPICAL_CREAM | CUTANEOUS | 3 refills | Status: DC

## 2018-10-29 MED ORDER — ONDANSETRON HCL 8 MG PO TABS: 8.0000 mg | ORAL_TABLET | Freq: Two times a day (BID) | ORAL | 1 refills | Status: DC | PRN

## 2018-10-29 MED ORDER — DEXAMETHASONE 4 MG PO TABS: 8.0000 mg | ORAL_TABLET | Freq: Every day | ORAL | 1 refills | Status: DC

## 2018-10-29 MED ORDER — LORAZEPAM 0.5 MG PO TABS: 0.5000 mg | ORAL_TABLET | Freq: Four times a day (QID) | ORAL | 0 refills | Status: DC | PRN

## 2018-10-29 MED ORDER — PROCHLORPERAZINE MALEATE 10 MG PO TABS: 10.0000 mg | ORAL_TABLET | Freq: Four times a day (QID) | ORAL | 1 refills | Status: DC | PRN

## 2018-10-29 NOTE — Progress Notes (Signed)
Hematology/Oncology Consult note [facility] Telephone:([phone] [phone]  Patient Care Team: Steele Sizer, MD as PCP - General (Family Medicine) Rubie Maid, MD as Consulting Physician (Obstetrics and Gynecology) Clent Jacks, RN as Registered Nurse   Name of the patient: Molly [name]  [id number]  [birth year]    Reason for referral-new diagnosis of endometrial carcinoma   Referring physician- Dr. Purvis Kilts  Date of visit: 10/29/18   History of presenting illness-patient is a 67 year old female who follows up with Dr. Marcelline Mates for cystocele and rectocele.  She was noted to have postmenopausal bleeding and cramping recently.  Ultrasound revealed heterogeneous appearance of uterus with fibroids invading the endometrium.  She underwent an endometrial biopsy when she had a second bout of postmenopausal bleeding.  Biopsy showed endometrioid adenocarcinoma FIGO grade 1.  She was then seen by Dr. Fransisca Connors and plan was to proceed with laparoscopic hysterectomy and bilateral salpingo-oophorectomy along with pelvic lymph node sampling  Final pathology showed: Endometrioid carcinoma FIGO grade 2 with 33% myometrial invasion.  Lymphovascular invasion present.  Peritoneal/ascites fluid atypical.  2 out of 3 sentinel lymph nodes were positive for macro metastases.  pT1a pN1a.  FIGO IIIC1  Patient's case was discussed at tumor board and consensus was to proceed with 3 cycles of adjuvant carbotaxol followed by radiation followed by 3 more cycles of chemotherapy.  Patient is already met with Dr. Donella Stade who will be giving radiation therapy to pelvic and periaortic lymph nodes and boost to her vaginal cuff.  PET CT scan is scheduled for tomorrow.  Patient lives at home with her 75 year old mother for whom she is a primary caregiver.  Her past medical history is significant for anxiety, hypertension hyperlipidemia among other medical problems.  She is independent of  her ADLs and IADLs.  Denies any baseline neuropathy.  Her appetite is good and she denies any unintentional weight loss.  She still has some mild soreness at the site of surgery.  Denies any bleeding.  ECOG PS- 1  Pain scale- 0   Review of systems- Review of Systems  Constitutional: Positive for malaise/fatigue. Negative for chills, fever and weight loss.  HENT: Negative for congestion, ear discharge and nosebleeds.   Eyes: Negative for blurred vision.  Respiratory: Negative for cough, hemoptysis, sputum production, shortness of breath and wheezing.   Cardiovascular: Negative for chest pain, palpitations, orthopnea and claudication.  Gastrointestinal: Negative for abdominal pain, blood in stool, constipation, diarrhea, heartburn, melena, nausea and vomiting.  Genitourinary: Negative for dysuria, flank pain, frequency, hematuria and urgency.  Musculoskeletal: Negative for back pain, joint pain and myalgias.  Skin: Negative for rash.  Neurological: Negative for dizziness, tingling, focal weakness, seizures, weakness and headaches.  Endo/Heme/Allergies: Does not bruise/bleed easily.  Psychiatric/Behavioral: Negative for depression and suicidal ideas. The patient does not have insomnia.     Allergies  Allergen Reactions  . Nsaids Hives and Rash  . Statins Other (See Comments)    Joint pains  . Victoza [Liraglutide] Other (See Comments)    pancreatitis  . Oxycodone Itching and Swelling  . Crestor [Rosuvastatin Calcium] Rash    Patient Active Problem List   Diagnosis Date Noted  . Endometrial adenocarcinoma ([place]) 10/29/2018  . Umbilical hernia without obstruction and without gangrene   . Atherosclerosis of right coronary artery 11/10/2017  . Atherosclerosis of abdominal aorta ([place]) 11/10/2017  . Prediabetes 10/02/2017  . Pain in limb 03/31/2017  . Perennial allergic rhinitis with seasonal variation 05/14/2016  . Asthma, mild intermittent,  well-controlled 05/14/2016  .  Hypertension goal BP (blood pressure) < 140/90 12/10/2015  . Cardiomyopathy due to hypertension ([place]) 12/10/2015  . History of CVA (cerebrovascular accident) 12/10/2015  . Hyperlipidemia LDL goal <70 12/10/2015  . Statin intolerance 12/10/2015     Past Medical History:  Diagnosis Date  . Anxiety   . Asthma    history of asthma  . Cardiomyopathy ([place])   . Complication of anesthesia    tore hair out and made teeth rough  . Depression   . Endometrial cancer ([place]) 08/2018  . GERD (gastroesophageal reflux disease)    takes prilosec prn  . History of CVA (cerebrovascular accident) 12/10/2015  . Hyperlipidemia LDL goal <70 12/10/2015  . Hypertension   . Prediabetes 10/02/2017   A1c 6 in January 2018  . Stroke [facility]) 1990    no residual effects     Past Surgical History:  Procedure Laterality Date  . CHOLECYSTECTOMY    . COLONOSCOPY WITH PROPOFOL N/A 01/08/2017   Procedure: COLONOSCOPY WITH PROPOFOL;  Surgeon: [name], MD;  Location: [facility] ENDOSCOPY;  Service: Endoscopy;  Laterality: N/A;  . COLONOSCOPY WITH PROPOFOL N/A 05/05/2018   Procedure: COLONOSCOPY WITH PROPOFOL;  Surgeon: [name], MD;  Location[facility] ENDOSCOPY;  Service: Gastroenterology;  Laterality: N/A;  . HERNIA REPAIR  78/2423   umbilical  . SENTINEL NODE BIOPSY N/A 10/06/2018   Procedure: SENTINEL NODE BIOPSY;  Surgeon: Mellody Drown, MD;  Location: [facility] ORS;  Service: Gynecology;  Laterality: N/A;  . UMBILICAL HERNIA REPAIR N/A 11/17/2017   Procedure: HERNIA REPAIR UMBILICAL ADULT;  Surgeon: Jules Husbands, MD;  Location: [facility] ORS;  Service: General;  Laterality: N/A;    Social History   Socioeconomic History  . Marital status: Divorced    Spouse name: Not on file  . Number of children: 2  . Years of education: some college  . Highest education level: 12th grade  Occupational History  . 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 Occupation: currently a Careers adviser  .  Financial resource strain: Not hard at all  . Food insecurity:    Worry: Never true    Inability: Never true  . Transportation needs:    Medical: No    Non-medical: No  Tobacco Use  . Smoking status: Never Smoker  . Smokeless tobacco: Never Used  . Tobacco comment: smoking cessation materials not required  Substance and Sexual Activity  . Alcohol use: No    Alcohol/week: 0.0 standard drinks  . Drug use: No  . Sexual activity: Never  Lifestyle  . Physical activity:    Days per week: 0 days    Minutes per session: 0 min  . Stress: Not at all  Relationships  . Social connections:    Talks on phone: More than three times a week    Gets together: Three times a week    Attends religious service: More than 4 times per year    Active member of club or organization: Yes    Attends meetings of clubs or organizations: More than 4 times per year    Relationship status: Divorced  . Intimate partner violence:    Fear of current or ex partner: No    Emotionally abused: No    Physically abused: No    Forced sexual activity: No  Other Topics Concern  . Not on file  Social History Narrative  . Not on file  Family History  Problem Relation Age of Onset  . Stroke Mother   . Hypertension Mother   . Dementia Mother   . Alzheimer's disease Mother   . Gout Father   . Asthma Father   . Hypertension Father   . Dementia Father   . Healthy Sister   . Stroke Brother   . Alzheimer's disease Brother   . Healthy Daughter   . Hypertension Brother   . Healthy Brother   . Cancer Paternal Grandmother   . Healthy Sister   . Healthy Sister   . Hypertension Sister   . Hypertension Sister   . Stroke Brother   . Alzheimer's disease Brother   . Stroke Brother   . Hypertension Brother   . Stroke Brother   . Hypertension Brother   . Healthy Brother   . Healthy Brother   . Hypertension Daughter   . Breast cancer Neg Hx      Current Outpatient Medications:  .  calcium citrate-vitamin  D (CITRACAL+D) 315-200 MG-UNIT tablet, Take 1 tablet by mouth daily., Disp: , Rfl:  .  escitalopram (LEXAPRO) 5 MG tablet, Take 1 tablet (5 mg total) by mouth daily., Disp: 90 tablet, Rfl: 0 .  ezetimibe (ZETIA) 10 MG tablet, Take 1 tablet (10 mg total) by mouth daily., Disp: 90 tablet, Rfl: 1 .  olmesartan-hydrochlorothiazide (BENICAR HCT) 20-12.5 MG tablet, Take 1 tablet by mouth daily., Disp: 90 tablet, Rfl: 1 .  acetaminophen (TYLENOL) 500 MG tablet, Take 2 tablets (1,000 mg total) by mouth every 6 (six) hours as needed for mild pain or moderate pain. (Patient not taking: Reported on 10/28/2018), Disp: 30 tablet, Rfl: 0 .  albuterol (PROVENTIL HFA;VENTOLIN HFA) 108 (90 Base) MCG/ACT inhaler, Inhale 2 puffs into the lungs every 4 (four) hours as needed for wheezing or shortness of breath. (Patient not taking: Reported on 10/28/2018), Disp: 1 Inhaler, Rfl: 0 .  cetirizine (ZYRTEC) 10 MG tablet, Take 1 tablet (10 mg total) by mouth daily as needed. (Patient not taking: Reported on 10/28/2018), Disp: 90 tablet, Rfl: 1 .  diclofenac sodium (VOLTAREN) 1 % GEL, Apply 2 g topically 3 (three) times daily as needed (pain). (Patient not taking: Reported on 10/28/2018), Disp: 100 g, Rfl: 1 .  docusate sodium (COLACE) 100 MG capsule, Take 1 capsule (100 mg total) by mouth 2 (two) times daily as needed for mild constipation. (Patient not taking: Reported on 10/28/2018), Disp: 30 capsule, Rfl: 2 .  furosemide (LASIX) 40 MG tablet, Take 1 tablet (40 mg total) by mouth daily. (Patient not taking: Reported on 10/28/2018), Disp: 90 tablet, Rfl: 1 .  ketoconazole (NIZORAL) 2 % cream, Apply 1 application topically daily as needed for irritation. On abdominal fold prn (Patient not taking: Reported on 10/28/2018), Disp: 60 g, Rfl: 0 .  metFORMIN (GLUCOPHAGE-XR) 750 MG 24 hr tablet, Take 1 tablet (750 mg total) by mouth daily with breakfast. (Patient not taking: Reported on 10/28/2018), Disp: 90 tablet, Rfl: 1 .  traMADol  (ULTRAM) 50 MG tablet, Take 1 tablet (50 mg total) by mouth every 6 (six) hours as needed. (Patient not taking: Reported on 10/28/2018), Disp: 30 tablet, Rfl: 0   Physical exam:  Vitals:   10/28/18 1059  BP: (!) 165/75  Pulse: (!) 50  Resp: 18  Temp: 97.9 F (36.6 C)  TempSrc: Tympanic  SpO2: 98%  Weight: 209 lb 8 oz (95 kg)  Height: 4' 11"  (1.499 m)   Physical Exam  Constitutional: She is oriented to  person, place, and time.  Patient is obese.  Does not appear to be in any acute distress  HENT:  Head: Normocephalic and atraumatic.  Eyes: Pupils are equal, round, and reactive to light. EOM are normal.  Neck: Normal range of motion.  Cardiovascular: Normal rate, regular rhythm and normal heart sounds.  Pulmonary/Chest: Effort normal and breath sounds normal.  Abdominal: Soft. Bowel sounds are normal.  Scars from recent laparoscopic surgery and healed well  Neurological: She is alert and oriented to person, place, and time.  Skin: Skin is warm and dry.       CMP Latest Ref Rng & Units 10/28/2018  Glucose 70 - 99 mg/dL 103(H)  BUN 8 - 23 mg/dL 13  Creatinine 0.44 - 1.00 mg/dL 0.82  Sodium 135 - 145 mmol/L 141  Potassium 3.5 - 5.1 mmol/L 3.8  Chloride 98 - 111 mmol/L 106  CO2 22 - 32 mmol/L 27  Calcium 8.9 - 10.3 mg/dL 9.6  Total Protein 6.5 - 8.1 g/dL 7.6  Total Bilirubin 0.3 - 1.2 mg/dL 0.6  Alkaline Phos 38 - 126 U/L 87  AST 15 - 41 U/L 24  ALT 0 - 44 U/L 31   CBC Latest Ref Rng & Units 10/28/2018  WBC 4.0 - 10.5 K/uL 4.9  Hemoglobin 12.0 - 15.0 g/dL 11.1(L)  Hematocrit 36.0 - 46.0 % 34.6(L)  Platelets 150 - 400 K/uL 340    No images are attached to the encounter.  Ct Chest W Contrast  Result Date: 10/25/2018 CLINICAL DATA:  Endometrial adenocarcinoma status post [facility] 10/06/2018. Positive metastatic nodes in the right external iliac and left obturator chains. Staging evaluation. EXAM: CT CHEST, ABDOMEN, AND PELVIS WITH CONTRAST TECHNIQUE: Multidetector CT  imaging of the chest, abdomen and pelvis was performed following the standard protocol during bolus administration of intravenous contrast. CONTRAST:  170m ISOVUE-300 IOPAMIDOL (ISOVUE-300) INJECTION 61% COMPARISON:  04/07/2018 chest radiograph. 10/29/2017 CT abdomen/pelvis. FINDINGS: CT CHEST FINDINGS Cardiovascular: Top-normal heart size. No significant pericardial effusion/thickening. Left anterior descending coronary atherosclerosis. Atherosclerotic nonaneurysmal thoracic aorta. Normal caliber pulmonary arteries. No central pulmonary emboli. Mediastinum/Nodes: No discrete thyroid nodules. Unremarkable esophagus. No pathologically enlarged axillary, mediastinal or hilar lymph nodes. Lungs/Pleura: No pneumothorax. No pleural effusion. No acute consolidative airspace disease or lung masses. There are 2 scattered tiny pulmonary nodules, largest 2 mm in the left upper lobe (series 3/image 77). Musculoskeletal: No aggressive appearing focal osseous lesions. Moderate thoracic spondylosis. CT ABDOMEN PELVIS FINDINGS Hepatobiliary: Normal liver with no liver mass. Cholecystectomy. No biliary ductal dilatation. Pancreas: Normal, with no mass or duct dilation. Spleen: Normal size. No mass. Adrenals/Urinary Tract: Normal adrenals. Mild scarring in the anterior lower left kidney. No renal masses. No hydronephrosis. Normal bladder. Stomach/Bowel: Normal non-distended stomach. Normal caliber small bowel with no small bowel wall thickening. Appendix not discretely visualized. No pericecal inflammatory changes. Normal large bowel with no diverticulosis, large bowel wall thickening or pericolonic fat stranding. Vascular/Lymphatic: Atherosclerotic nonaneurysmal abdominal aorta. Patent portal, splenic, hepatic and renal veins. Enlarged low left para aortic 1.7 cm node (series 2/image 76). Mild right external iliac adenopathy up to 1.0 cm (series 2/image 94). Enlarged 1.2 cm left external iliac node (series 2/image 91). No  additional pathologically enlarged lymph nodes in the abdomen or pelvis. Reproductive: No mass or fluid collection at the vaginal cuff. Right adnexal 2.2 x 2.1 cm simple fluid collection (series 2/image 80) and left adnexal 2.9 x 2.1 cm simple fluid collection (series 2/image 84). No solid adnexal masses. Other: No pneumoperitoneum.  No ascites. Minimal fat stranding in the omentum (series 2/image 78) without discrete peritoneal nodules. Musculoskeletal: No aggressive appearing focal osseous lesions. Moderate lumbar spondylosis. IMPRESSION: 1. Low left para-aortic and bilateral external iliac lymphadenopathy compatible with metastatic disease. 2. Two tiny pulmonary nodules, largest 2 mm, indeterminate, for which follow-up chest CT is advised in 3-6 months. 3. Nonspecific minimal fat stranding in the omentum without discrete peritoneal nodularity, for which attention on follow-up CT abdomen/pelvis is advised. 4. No additional potential findings of metastatic disease. 5. Small simple fluid collections in the bilateral adnexal regions most compatible with postsurgical seromas. 6. One vessel coronary atherosclerosis. 7.  Aortic Atherosclerosis (ICD10-I70.0). Electronically Signed   By: Ilona Sorrel M.D.   On: 10/25/2018 09:40   Ct Abdomen Pelvis W Contrast  Result Date: 10/25/2018 CLINICAL DATA:  Endometrial adenocarcinoma status post [facility] 10/06/2018. Positive metastatic nodes in the right external iliac and left obturator chains. Staging evaluation. EXAM: CT CHEST, ABDOMEN, AND PELVIS WITH CONTRAST TECHNIQUE: Multidetector CT imaging of the chest, abdomen and pelvis was performed following the standard protocol during bolus administration of intravenous contrast. CONTRAST:  141m ISOVUE-300 IOPAMIDOL (ISOVUE-300) INJECTION 61% COMPARISON:  04/07/2018 chest radiograph. 10/29/2017 CT abdomen/pelvis. FINDINGS: CT CHEST FINDINGS Cardiovascular: Top-normal heart size. No significant pericardial effusion/thickening.  Left anterior descending coronary atherosclerosis. Atherosclerotic nonaneurysmal thoracic aorta. Normal caliber pulmonary arteries. No central pulmonary emboli. Mediastinum/Nodes: No discrete thyroid nodules. Unremarkable esophagus. No pathologically enlarged axillary, mediastinal or hilar lymph nodes. Lungs/Pleura: No pneumothorax. No pleural effusion. No acute consolidative airspace disease or lung masses. There are 2 scattered tiny pulmonary nodules, largest 2 mm in the left upper lobe (series 3/image 77). Musculoskeletal: No aggressive appearing focal osseous lesions. Moderate thoracic spondylosis. CT ABDOMEN PELVIS FINDINGS Hepatobiliary: Normal liver with no liver mass. Cholecystectomy. No biliary ductal dilatation. Pancreas: Normal, with no mass or duct dilation. Spleen: Normal size. No mass. Adrenals/Urinary Tract: Normal adrenals. Mild scarring in the anterior lower left kidney. No renal masses. No hydronephrosis. Normal bladder. Stomach/Bowel: Normal non-distended stomach. Normal caliber small bowel with no small bowel wall thickening. Appendix not discretely visualized. No pericecal inflammatory changes. Normal large bowel with no diverticulosis, large bowel wall thickening or pericolonic fat stranding. Vascular/Lymphatic: Atherosclerotic nonaneurysmal abdominal aorta. Patent portal, splenic, hepatic and renal veins. Enlarged low left para aortic 1.7 cm node (series 2/image 76). Mild right external iliac adenopathy up to 1.0 cm (series 2/image 94). Enlarged 1.2 cm left external iliac node (series 2/image 91). No additional pathologically enlarged lymph nodes in the abdomen or pelvis. Reproductive: No mass or fluid collection at the vaginal cuff. Right adnexal 2.2 x 2.1 cm simple fluid collection (series 2/image 80) and left adnexal 2.9 x 2.1 cm simple fluid collection (series 2/image 84). No solid adnexal masses. Other: No pneumoperitoneum. No ascites. Minimal fat stranding in the omentum (series 2/image  78) without discrete peritoneal nodules. Musculoskeletal: No aggressive appearing focal osseous lesions. Moderate lumbar spondylosis. IMPRESSION: 1. Low left para-aortic and bilateral external iliac lymphadenopathy compatible with metastatic disease. 2. Two tiny pulmonary nodules, largest 2 mm, indeterminate, for which follow-up chest CT is advised in 3-6 months. 3. Nonspecific minimal fat stranding in the omentum without discrete peritoneal nodularity, for which attention on follow-up CT abdomen/pelvis is advised. 4. No additional potential findings of metastatic disease. 5. Small simple fluid collections in the bilateral adnexal regions most compatible with postsurgical seromas. 6. One vessel coronary atherosclerosis. 7.  Aortic Atherosclerosis (ICD10-I70.0). Electronically Signed   By: JJanina MayoD.  On: 10/25/2018 09:40    Assessment and plan- Patient is a 67 y.o. female with newly diagnosed invasive adenocarcinoma of the endometrium endometrioid subtype FIGO Stage IIIC1 pT1a pN1aMx  Patient will be getting PET CT scan tomorrow to complete her staging work-up.  As discussed at the tumor board I will plan to start with 3 cycles of adjuvant carboplatin and Taxol in 1 week's time.  I will give carboplatin with an AUC of 5 and Taxol at 175 milligrams per meter squar IV every 3 weeks with onpro neulasta support for 3 cycles initially followed by radiation followed by 3 more cycles of treatment.  Discussed risks and benefits of chemotherapy including all but not limited to nausea, vomiting, fatigue, low blood counts, risk of infections and hospitalization.  Risk of hair loss, peripheral neuropathy and infusion reaction associated with Taxol.  Patient understands and agrees to proceed as planned.  Treatment will be given with a curative intent.  She will be getting port placed with Dr. Dahlia Byes and will be attending chemo teaching the next 1 week.  Patient will be seeing Dr. Fransisca Connors in 2 weeks time but we  do not have to wait for 2 weeks to start chemotherapy.  I will see her back in 1 week's time with CBC CMP to start her first cycle of carboplatin and Taxol  Cancer Staging Endometrial adenocarcinoma [facility]) Staging form: Corpus Uteri - Carcinoma and Carcinosarcoma, AJCC 8th Edition - Clinical stage from 10/28/2018: FIGO Stage IIIC1 (cT1a, cN1a, cM0) - Signed by Sindy Guadeloupe, MD on 10/29/2018   Thank you for this kind referral and the opportunity to participate in the care of this patient   Visit Diagnosis 1. Endometrial adenocarcinoma ([place])   2. Goals of care, counseling/discussion     Dr. Randa Evens, MD, MPH [facility] at [facility] [phone] 10/29/2018 1:13 PM

## 2018-10-29 NOTE — Progress Notes (Signed)
START ON PATHWAY REGIMEN - Uterine     A cycle is every 21 days:     Paclitaxel      Carboplatin   **Always confirm dose/schedule in your pharmacy ordering system**  Patient Characteristics: Endometrioid Histology, Newly Diagnosed, Resected, Stage IIIC1/IIIC2 - Grade 1, 2, or 3 Histology: Endometrioid Histology Therapeutic Status: Newly Diagnosed AJCC T Category: T1a AJCC N Category: N1a AJCC M Category: M0 AJCC 8 Stage Grouping: IIIC1 Surgical Status: Resected Intent of Therapy: Curative Intent, Discussed with Patient

## 2018-11-01 ENCOUNTER — Ambulatory Visit (INDEPENDENT_AMBULATORY_CARE_PROVIDER_SITE_OTHER): Payer: Medicare Other | Admitting: Surgery

## 2018-11-01 ENCOUNTER — Other Ambulatory Visit: Payer: Self-pay

## 2018-11-01 ENCOUNTER — Encounter: Payer: Self-pay | Admitting: *Deleted

## 2018-11-01 ENCOUNTER — Encounter: Payer: Self-pay | Admitting: Surgery

## 2018-11-01 VITALS — BP 140/76 | HR 80 | Temp 97.9°F | Ht 59.0 in | Wt 210.0 lb

## 2018-11-01 DIAGNOSIS — C541 Malignant neoplasm of endometrium: Secondary | ICD-10-CM | POA: Diagnosis not present

## 2018-11-01 NOTE — H&P (View-Only) (Signed)
Outpatient Surgical Follow Up  95/12/8839  Molly [name] is an 67 y.o. female.   Chief Complaint  Patient presents with  . Other    HPI: 67 year old female well-known to me with a recently diagnosed endometrial cancer.  I performed a ventral hernia repair a year ago and she has done very well from this perspective. He now is in need for chemotherapy and port placement. I have personally reviewed the laboratory findings and her INR, hb and platelets are normal. He has done very well after her hysterectomy and oophorectomy.  Has any abdominal pain no fevers no chills. No previous central lines or pacemakers  Past Medical History:  Diagnosis Date  . Anxiety   . Asthma    history of asthma  . Cardiomyopathy ([place])   . Complication of anesthesia    tore hair out and made teeth rough  . Depression   . Endometrial cancer ([place]) 08/2018  . GERD (gastroesophageal reflux disease)    takes prilosec prn  . History of CVA (cerebrovascular accident) 12/10/2015  . Hyperlipidemia LDL goal <70 12/10/2015  . Hypertension   . Prediabetes 10/02/2017   A1c 6 in January 2018  . Stroke [facility]) 1990    no residual effects    Past Surgical History:  Procedure Laterality Date  . CHOLECYSTECTOMY    . COLONOSCOPY WITH PROPOFOL N/A 01/08/2017   Procedure: COLONOSCOPY WITH PROPOFOL;  Surgeon: [name], MD;  Location: [facility] ENDOSCOPY;  Service: Endoscopy;  Laterality: N/A;  . COLONOSCOPY WITH PROPOFOL N/A 05/05/2018   Procedure: COLONOSCOPY WITH PROPOFOL;  Surgeon: [name], MD;  Location: [facility] ENDOSCOPY;  Service: Gastroenterology;  Laterality: N/A;  . HERNIA REPAIR  66/0630   umbilical  . SENTINEL NODE BIOPSY N/A 10/06/2018   Procedure: SENTINEL NODE BIOPSY;  Surgeon: Mellody Drown, MD;  Location: [facility] ORS;  Service: Gynecology;  Laterality: N/A;  . UMBILICAL HERNIA REPAIR N/A 11/17/2017   Procedure: HERNIA REPAIR UMBILICAL ADULT;  Surgeon: Jules Husbands, MD;  Location: [facility] ORS;  Service:  General;  Laterality: N/A;    Family History  Problem Relation Age of Onset  . Stroke Mother   . Hypertension Mother   . Dementia Mother   . Alzheimer's disease Mother   . Gout Father   . Asthma Father   . Hypertension Father   . Dementia Father   . Healthy Sister   . Stroke Brother   . Alzheimer's disease Brother   . Healthy Daughter   . Hypertension Brother   . Healthy Brother   . Cancer Paternal Grandmother   . Healthy Sister   . Healthy Sister   . Hypertension Sister   . Hypertension Sister   . Stroke Brother   . Alzheimer's disease Brother   . Stroke Brother   . Hypertension Brother   . Stroke Brother   . Hypertension Brother   . Healthy Brother   . Healthy Brother   . Hypertension Daughter   . Breast cancer Neg Hx     Social History:  reports that she has never smoked. She has never used smokeless tobacco. She reports that she does not drink alcohol or use drugs.  Allergies:  Allergies  Allergen Reactions  . Nsaids Hives and Rash  . Statins Other (See Comments)    Joint pains  . Victoza [Liraglutide] Other (See Comments)    pancreatitis  . Oxycodone Itching and Swelling  . Crestor [Rosuvastatin Calcium] Rash    Medications reviewed.    ROS Full  ROS performed and is otherwise negative other than what is stated in HPI   BP 140/76   Pulse 80   Temp 97.9 F (36.6 C) (Skin)   Ht 4\' 11"  (1.499 m)   Wt 210 lb (95.3 kg)   BMI 42.41 kg/m   Physical Exam  Constitutional: She is oriented to person, place, and time. She appears well-developed and well-nourished.  Neck: Normal range of motion. No JVD present. No tracheal deviation present. No thyromegaly present.  Cardiovascular: Normal rate and regular rhythm.  Pulmonary/Chest: Effort normal and breath sounds normal. No stridor. No respiratory distress.  Abdominal: Soft. She exhibits no distension and no mass. There is no tenderness. There is no guarding.  Lymphadenopathy:    She has no cervical  adenopathy.  Neurological: She is alert and oriented to person, place, and time. She displays normal reflexes. No cranial nerve deficit. Coordination normal.  Skin: Skin is warm and dry. Capillary refill takes less than 2 seconds.  Psychiatric: She has a normal mood and affect. Her behavior is normal. Judgment and thought content normal.  Nursing note and vitals reviewed.      Assessment/Plan: 67 year old female with recently diagnosed endometrial cancer in need for chemotherapy and port placement.  Discussed with the patient in detail about the procedure.  Risk benefit and possible complications including but not limited to: Bleeding, infection vascular injury pneumothorax and pain.  SHe understands and wishes to proceed Greater than 50% of the 25 minutes  visit was spent in counseling/coordination of care   Caroleen Hamman, MD [place] Surgeon

## 2018-11-01 NOTE — Progress Notes (Signed)
Patient's surgery has been scheduled for 11-04-18 at [facility] with Dr. Dahlia Byes.  The patient is aware of pre-admission appointment date and time.   The patient is aware to call the office should they have further questions.

## 2018-11-01 NOTE — Patient Instructions (Signed)
Implanted Port Insertion  Implanted port insertion is a procedure to put in a port and catheter. The port is a device with an injectable disk that can be accessed by your health care provider. The port is connected to a vein in the chest or neck by a small flexible tube (catheter). There are different types of ports. The implanted port may be used as a long-term IV access for:  · Medicines, such as chemotherapy.  · Fluids.  · Liquid nutrition, such as total parenteral nutrition (TPN).  · Blood samples.    Having a port means that your health care provider will not need to use the veins in your arms for these procedures.  Tell a health care provider about:  · Any allergies you have.  · All medicines you are taking, especially blood thinners, as well as any vitamins, herbs, eye drops, creams, over-the-counter medicines, and steroids.  · Any problems you or family members have had with anesthetic medicines.  · Any blood disorders you have.  · Any surgeries you have had.  · Any medical conditions you have, including diabetes or kidney problems.  · Whether you are pregnant or may be pregnant.  What are the risks?  Generally, this is a safe procedure. However, problems may occur, including:  · Allergic reactions to medicines or dyes.  · Damage to other structures or organs.  · Infection.  · Damage to the blood vessel, bruising, or bleeding at the puncture site.  · Blood clot.  · Breakdown of the skin over the port.  · A collection of air in the chest that can cause one of the lungs to collapse (pneumothorax). This is rare.    What happens before the procedure?  Staying hydrated  Follow instructions from your health care provider about hydration, which may include:  · Up to 2 hours before the procedure - you may continue to drink clear liquids, such as water, clear fruit juice, black coffee, and plain tea.    Eating and drinking restrictions  · Follow instructions from your health care provider about eating and drinking,  which may include:  ? 8 hours before the procedure - stop eating heavy meals or foods such as meat, fried foods, or fatty foods.  ? 6 hours before the procedure - stop eating light meals or foods, such as toast or cereal.  ? 6 hours before the procedure - stop drinking milk or drinks that contain milk.  ? 2 hours before the procedure - stop drinking clear liquids.  Medicines  · Ask your health care provider about:  ? Changing or stopping your regular medicines. This is especially important if you are taking diabetes medicines or blood thinners.  ? Taking medicines such as aspirin and ibuprofen. These medicines can thin your blood. Do not take these medicines before your procedure if your health care provider instructs you not to.  · You may be given antibiotic medicine to help prevent infection.  General instructions  · Plan to have someone take you home from the hospital or clinic.  · If you will be going home right after the procedure, plan to have someone with you for 24 hours.  · You may have blood tests.  · You may be asked to shower with a germ-killing soap.  What happens during the procedure?  · To lower your risk of infection:  ? Your health care team will wash or sanitize their hands.  ? Your skin will be washed with   soap.  ? Hair may be removed from the surgical area.  · An IV tube will be inserted into one of your veins.  · You will be given one or more of the following:  ? A medicine to help you relax (sedative).  ? A medicine to numb the area (local anesthetic).  · Two small cuts (incisions) will be made to insert the port.  ? One incision will be made in your neck to get access to the vein where the catheter will lie.  ? The other incision will be made in the upper chest. This is where the port will lie.  · The procedure may be done using continuous X-ray (fluoroscopy) or other imaging tools for guidance.  · The port and catheter will be placed. There may be a small, raised area where the port  is.  · The port will be flushed with a salt solution (saline), and blood will be drawn to make sure that it is working correctly.  · The incisions will be closed.  · Bandages (dressings) may be placed over the incisions.  The procedure may vary among health care providers and hospitals.  What happens after the procedure?  · Your blood pressure, heart rate, breathing rate, and blood oxygen level will be monitored until the medicines you were given have worn off.  · Do not drive for 24 hours if you were given a sedative.  · You will be given a manufacturer's information card for the type of port that you have. Keep this with you.  · Your port will need to be flushed and checked as told by your health care provider, usually every few weeks.  · A chest X-ray will be done to:  ? Check the placement of the port.  ? Make sure there is no injury to your lung.  Summary  · Implanted port insertion is a procedure to put in a port and catheter.  · The implanted port is used as a long-term IV access.  · The port will need to be flushed and checked as told by your health care provider, usually every few weeks.  · Keep your manufacturer's information card with you at all times.  This information is not intended to replace advice given to you by your health care provider. Make sure you discuss any questions you have with your health care provider.  Document Released: 10/05/2013 Document Revised: 11/05/2016 Document Reviewed: 11/05/2016  Elsevier Interactive Patient Education © 2017 [employer] Inc[employer]

## 2018-11-01 NOTE — Progress Notes (Signed)
Outpatient Surgical Follow Up  23/06/6282  Molly [name] is an 67 y.o. female.   Chief Complaint  Patient presents with  . Other    HPI: 67 year old female well-known to me with a recently diagnosed endometrial cancer.  I performed a ventral hernia repair a year ago and she has done very well from this perspective. He now is in need for chemotherapy and port placement. I have personally reviewed the laboratory findings and her INR, hb and platelets are normal. He has done very well after her hysterectomy and oophorectomy.  Has any abdominal pain no fevers no chills. No previous central lines or pacemakers  Past Medical History:  Diagnosis Date  . Anxiety   . Asthma    history of asthma  . Cardiomyopathy ([place])   . Complication of anesthesia    tore hair out and made teeth rough  . Depression   . Endometrial cancer ([place]) 08/2018  . GERD (gastroesophageal reflux disease)    takes prilosec prn  . History of CVA (cerebrovascular accident) 12/10/2015  . Hyperlipidemia LDL goal <70 12/10/2015  . Hypertension   . Prediabetes 10/02/2017   A1c 6 in January 2018  . Stroke [facility]) 1990    no residual effects    Past Surgical History:  Procedure Laterality Date  . CHOLECYSTECTOMY    . COLONOSCOPY WITH PROPOFOL N/A 01/08/2017   Procedure: COLONOSCOPY WITH PROPOFOL;  Surgeon: [name], MD;  Location: [facility] ENDOSCOPY;  Service: Endoscopy;  Laterality: N/A;  . COLONOSCOPY WITH PROPOFOL N/A 05/05/2018   Procedure: COLONOSCOPY WITH PROPOFOL;  Surgeon: [name], MD;  Location: [facility] ENDOSCOPY;  Service: Gastroenterology;  Laterality: N/A;  . HERNIA REPAIR  15/1761   umbilical  . SENTINEL NODE BIOPSY N/A 10/06/2018   Procedure: SENTINEL NODE BIOPSY;  Surgeon: Mellody Drown, MD;  Location: [facility] ORS;  Service: Gynecology;  Laterality: N/A;  . UMBILICAL HERNIA REPAIR N/A 11/17/2017   Procedure: HERNIA REPAIR UMBILICAL ADULT;  Surgeon: Jules Husbands, MD;  Location: [facility] ORS;  Service:  General;  Laterality: N/A;    Family History  Problem Relation Age of Onset  . Stroke Mother   . Hypertension Mother   . Dementia Mother   . Alzheimer's disease Mother   . Gout Father   . Asthma Father   . Hypertension Father   . Dementia Father   . Healthy Sister   . Stroke Brother   . Alzheimer's disease Brother   . Healthy Daughter   . Hypertension Brother   . Healthy Brother   . Cancer Paternal Grandmother   . Healthy Sister   . Healthy Sister   . Hypertension Sister   . Hypertension Sister   . Stroke Brother   . Alzheimer's disease Brother   . Stroke Brother   . Hypertension Brother   . Stroke Brother   . Hypertension Brother   . Healthy Brother   . Healthy Brother   . Hypertension Daughter   . Breast cancer Neg Hx     Social History:  reports that she has never smoked. She has never used smokeless tobacco. She reports that she does not drink alcohol or use drugs.  Allergies:  Allergies  Allergen Reactions  . Nsaids Hives and Rash  . Statins Other (See Comments)    Joint pains  . Victoza [Liraglutide] Other (See Comments)    pancreatitis  . Oxycodone Itching and Swelling  . Crestor [Rosuvastatin Calcium] Rash    Medications reviewed.    ROS Full  ROS performed and is otherwise negative other than what is stated in HPI   BP 140/76   Pulse 80   Temp 97.9 F (36.6 C) (Skin)   Ht 4\' 11"  (1.499 m)   Wt 210 lb (95.3 kg)   BMI 42.41 kg/m   Physical Exam  Constitutional: She is oriented to person, place, and time. She appears well-developed and well-nourished.  Neck: Normal range of motion. No JVD present. No tracheal deviation present. No thyromegaly present.  Cardiovascular: Normal rate and regular rhythm.  Pulmonary/Chest: Effort normal and breath sounds normal. No stridor. No respiratory distress.  Abdominal: Soft. She exhibits no distension and no mass. There is no tenderness. There is no guarding.  Lymphadenopathy:    She has no cervical  adenopathy.  Neurological: She is alert and oriented to person, place, and time. She displays normal reflexes. No cranial nerve deficit. Coordination normal.  Skin: Skin is warm and dry. Capillary refill takes less than 2 seconds.  Psychiatric: She has a normal mood and affect. Her behavior is normal. Judgment and thought content normal.  Nursing note and vitals reviewed.      Assessment/Plan: 67 year old female with recently diagnosed endometrial cancer in need for chemotherapy and port placement.  Discussed with the patient in detail about the procedure.  Risk benefit and possible complications including but not limited to: Bleeding, infection vascular injury pneumothorax and pain.  SHe understands and wishes to proceed Greater than 50% of the 25 minutes  visit was spent in counseling/coordination of care   Caroleen Hamman, MD [place] Surgeon

## 2018-11-02 LAB — SURGICAL PATHOLOGY

## 2018-11-02 NOTE — Patient Instructions (Signed)

## 2018-11-03 ENCOUNTER — Inpatient Hospital Stay: Payer: Medicare Other | Attending: Oncology

## 2018-11-03 ENCOUNTER — Encounter
Admission: RE | Admit: 2018-11-03 | Discharge: 2018-11-03 | Disposition: A | Discharge status: Home / Self Care | Payer: Medicare Other | Source: Ambulatory Visit | Attending: Surgery | Admitting: Surgery

## 2018-11-03 ENCOUNTER — Other Ambulatory Visit: Payer: Self-pay

## 2018-11-03 DIAGNOSIS — Z90710 Acquired absence of both cervix and uterus: Secondary | ICD-10-CM | POA: Insufficient documentation

## 2018-11-03 DIAGNOSIS — D509 Iron deficiency anemia, unspecified: Secondary | ICD-10-CM | POA: Insufficient documentation

## 2018-11-03 DIAGNOSIS — Z01818 Encounter for other preprocedural examination: Secondary | ICD-10-CM | POA: Insufficient documentation

## 2018-11-03 DIAGNOSIS — C541 Malignant neoplasm of endometrium: Secondary | ICD-10-CM | POA: Diagnosis present

## 2018-11-03 DIAGNOSIS — K219 Gastro-esophageal reflux disease without esophagitis: Secondary | ICD-10-CM | POA: Diagnosis not present

## 2018-11-03 DIAGNOSIS — D72829 Elevated white blood cell count, unspecified: Secondary | ICD-10-CM | POA: Insufficient documentation

## 2018-11-03 DIAGNOSIS — D649 Anemia, unspecified: Secondary | ICD-10-CM | POA: Insufficient documentation

## 2018-11-03 DIAGNOSIS — Z5111 Encounter for antineoplastic chemotherapy: Secondary | ICD-10-CM | POA: Insufficient documentation

## 2018-11-03 DIAGNOSIS — I10 Essential (primary) hypertension: Secondary | ICD-10-CM | POA: Diagnosis not present

## 2018-11-03 DIAGNOSIS — Z90722 Acquired absence of ovaries, bilateral: Secondary | ICD-10-CM | POA: Insufficient documentation

## 2018-11-03 DIAGNOSIS — Z79899 Other long term (current) drug therapy: Secondary | ICD-10-CM | POA: Diagnosis not present

## 2018-11-03 DIAGNOSIS — I429 Cardiomyopathy, unspecified: Secondary | ICD-10-CM | POA: Diagnosis not present

## 2018-11-03 DIAGNOSIS — Z8673 Personal history of transient ischemic attack (TIA), and cerebral infarction without residual deficits: Secondary | ICD-10-CM | POA: Diagnosis not present

## 2018-11-03 MED ORDER — LORATADINE 10 MG PO TABS: 10.0000 mg | ORAL_TABLET | Freq: Every day | ORAL | Status: DC

## 2018-11-03 MED ORDER — LORATADINE 10 MG PO TABS: 10.0000 mg | ORAL_TABLET | Freq: Every day | ORAL | 11 refills | Status: DC

## 2018-11-03 MED ORDER — CEFAZOLIN SODIUM-DEXTROSE 2-4 GM/100ML-% IV SOLN
2.0000 g | INTRAVENOUS | Status: AC
  Administered 2018-11-04: 2 g via INTRAVENOUS

## 2018-11-03 NOTE — Patient Instructions (Signed)
Your procedure is scheduled on: Thursday 11/04/18.  Report to DAY SURGERY DEPARTMENT LOCATED ON 2ND FLOOR MEDICAL MALL ENTRANCE.   Remember: Instructions that are not followed completely may result in serious medical risk, up to and including death, or upon the discretion of your surgeon and anesthesiologist your surgery may need to be rescheduled.     _X__ 1. Do not eat food after midnight the night before your procedure.                 No gum chewing or hard candies. You may drink clear liquids up to 2 hours                 before you are scheduled to arrive for your surgery- DO NOT drink clear                 liquids within 2 hours of the start of your surgery.                 Clear Liquids include:  water, apple juice without pulp, clear carbohydrate                 drink such as Clearfast or Gatorade, Black Coffee or Tea (Do not add                 anything to coffee or tea).  __X__2.  On the morning of surgery brush your teeth with toothpaste and water, you may rinse your mouth with mouthwash if you wish.  Do not swallow any toothpaste or mouthwash.     _X__ 3.  No Alcohol for 24 hours before or after surgery.   _X__ 4.  Do Not Smoke or use e-cigarettes For 24 Hours Prior to Your Surgery.                 Do not use any chewable tobacco products for at least 6 hours prior to                 surgery.  __X__  6.  Notify your doctor if there is any change in your medical condition      (cold, fever, infections).     Do not wear jewelry, make-up, hairpins, clips or nail polish. Do not wear lotions, powders, or perfumes. No deodorant. Do not shave 48 hours prior to surgery. Men may shave face and neck. Do not bring valuables to the hospital.    [facility] is not responsible for any belongings or valuables.  Contacts, dentures/partials or body piercings may not be worn into surgery. Bring a case for your contacts, glasses or hearing aids, a denture cup will be supplied.     Patients discharged the day of surgery will not be allowed to drive home.    Please read over the following fact sheets that you were given:   MRSA Information  __X__ Take these medicines the morning of surgery with A SIP OF WATER:     1. albuterol (PROVENTIL HFA;VENTOLIN HFA) 108 (90 Base) MCG/ACT inhaler  2. escitalopram (LEXAPRO) 5 MG tablet  3. ezetimibe (ZETIA) 10 MG tablet  4. loratadine (CLARITIN) 10 MG tablet  5. omeprazole (PRILOSEC OTC) 20 MG tablet  6. LORazepam (ATIVAN) 0.5 MG tablet IF NEEDED  7. acetaminophen (TYLENOL) 500 MG tablet IF NEEDED    __X__ Use CHG Soap as directed  _ X___ Use inhalers on the day of surgery. Also bring the inhaler with you to the hospital on the  morning of surgery.  __X__ Stop Metformin    __X__ Stop Blood Thinners Coumadin/Plavix/Xarelto/Pleta/Pradaxa/Eliquis/Effient/Aspirin Wednesday 07/21/18.  __X__ Stop Anti-inflammatories 7 days before surgery such as Advil, Ibuprofen, Motrin, BC or Goodies Powder, Naprosyn, Naproxen, Aleve, Aspirin, Meloxicam. May take Tylenol if needed for pain or discomfort.   __X__ Stop all herbal supplements.

## 2018-11-04 ENCOUNTER — Encounter: Payer: Self-pay | Admitting: Emergency Medicine

## 2018-11-04 ENCOUNTER — Ambulatory Visit
Admission: RE | Admit: 2018-11-04 | Discharge: 2018-11-04 | Disposition: A | Discharge status: Home / Self Care | Payer: Medicare Other | Source: Ambulatory Visit | Attending: Surgery | Admitting: Surgery

## 2018-11-04 ENCOUNTER — Encounter
Admission: RE | Disposition: A | Discharge status: Home / Self Care | Payer: Self-pay | Source: Ambulatory Visit | Attending: Surgery

## 2018-11-04 ENCOUNTER — Ambulatory Visit: Payer: Medicare Other | Admitting: Anesthesiology

## 2018-11-04 ENCOUNTER — Ambulatory Visit: Payer: Medicare Other

## 2018-11-04 DIAGNOSIS — I10 Essential (primary) hypertension: Secondary | ICD-10-CM | POA: Insufficient documentation

## 2018-11-04 DIAGNOSIS — Z4682 Encounter for fitting and adjustment of non-vascular catheter: Secondary | ICD-10-CM | POA: Diagnosis not present

## 2018-11-04 DIAGNOSIS — E785 Hyperlipidemia, unspecified: Secondary | ICD-10-CM | POA: Diagnosis not present

## 2018-11-04 DIAGNOSIS — K219 Gastro-esophageal reflux disease without esophagitis: Secondary | ICD-10-CM | POA: Diagnosis not present

## 2018-11-04 DIAGNOSIS — C541 Malignant neoplasm of endometrium: Secondary | ICD-10-CM

## 2018-11-04 DIAGNOSIS — I429 Cardiomyopathy, unspecified: Secondary | ICD-10-CM | POA: Diagnosis not present

## 2018-11-04 DIAGNOSIS — Z79899 Other long term (current) drug therapy: Secondary | ICD-10-CM | POA: Insufficient documentation

## 2018-11-04 DIAGNOSIS — Z8673 Personal history of transient ischemic attack (TIA), and cerebral infarction without residual deficits: Secondary | ICD-10-CM | POA: Insufficient documentation

## 2018-11-04 HISTORY — PX: PORTACATH PLACEMENT: SHX2246

## 2018-11-04 SURGERY — INSERTION, TUNNELED CENTRAL VENOUS DEVICE, WITH PORT
Anesthesia: General

## 2018-11-04 MED ORDER — GLYCOPYRROLATE 0.2 MG/ML IJ SOLN
INTRAMUSCULAR | Status: DC | PRN
  Administered 2018-11-04: 0.2 mg via INTRAVENOUS

## 2018-11-04 MED ORDER — LIDOCAINE HCL (CARDIAC) PF 100 MG/5ML IV SOSY
PREFILLED_SYRINGE | INTRAVENOUS | Status: DC | PRN
  Administered 2018-11-04: 100 mg via INTRATRACHEAL

## 2018-11-04 MED ORDER — BUPIVACAINE-EPINEPHRINE (PF) 0.25% -1:200000 IJ SOLN
INTRAMUSCULAR | Status: DC | PRN
  Administered 2018-11-04: 10 mL

## 2018-11-04 MED ORDER — LIDOCAINE HCL (PF) 2 % IJ SOLN
INTRAMUSCULAR | Status: AC
  Filled 2018-11-04: qty 10

## 2018-11-04 MED ORDER — CHLORHEXIDINE GLUCONATE CLOTH 2 % EX PADS: 6.0000 | MEDICATED_PAD | Freq: Once | CUTANEOUS | Status: DC

## 2018-11-04 MED ORDER — PROPOFOL 500 MG/50ML IV EMUL
INTRAVENOUS | Status: AC
  Filled 2018-11-04: qty 50

## 2018-11-04 MED ORDER — GLYCOPYRROLATE 0.2 MG/ML IJ SOLN
INTRAMUSCULAR | Status: AC
  Filled 2018-11-04: qty 1

## 2018-11-04 MED ORDER — ONDANSETRON HCL 4 MG/2ML IJ SOLN: 4.0000 mg | Freq: Once | INTRAMUSCULAR | Status: DC | PRN

## 2018-11-04 MED ORDER — FENTANYL CITRATE (PF) 100 MCG/2ML IJ SOLN: 25.0000 ug | INTRAMUSCULAR | Status: DC | PRN

## 2018-11-04 MED ORDER — MIDAZOLAM HCL 2 MG/2ML IJ SOLN
INTRAMUSCULAR | Status: DC | PRN
  Administered 2018-11-04: 2 mg via INTRAVENOUS

## 2018-11-04 MED ORDER — ROCURONIUM BROMIDE 50 MG/5ML IV SOLN
INTRAVENOUS | Status: AC
  Filled 2018-11-04: qty 1

## 2018-11-04 MED ORDER — LIDOCAINE HCL (PF) 1 % IJ SOLN
INTRAMUSCULAR | Status: DC | PRN
  Administered 2018-11-04: 10 mL

## 2018-11-04 MED ORDER — SODIUM CHLORIDE (PF) 0.9 % IJ SOLN
INTRAMUSCULAR | Status: AC
  Filled 2018-11-04: qty 20

## 2018-11-04 MED ORDER — LIDOCAINE HCL (PF) 1 % IJ SOLN
INTRAMUSCULAR | Status: AC
  Filled 2018-11-04: qty 30

## 2018-11-04 MED ORDER — FENTANYL CITRATE (PF) 100 MCG/2ML IJ SOLN
INTRAMUSCULAR | Status: AC
  Filled 2018-11-04: qty 2

## 2018-11-04 MED ORDER — FENTANYL CITRATE (PF) 100 MCG/2ML IJ SOLN
INTRAMUSCULAR | Status: DC | PRN
  Administered 2018-11-04 (×2): 50 ug via INTRAVENOUS

## 2018-11-04 MED ORDER — MIDAZOLAM HCL 2 MG/2ML IJ SOLN
INTRAMUSCULAR | Status: AC
  Filled 2018-11-04: qty 2

## 2018-11-04 MED ORDER — LACTATED RINGERS IV SOLN
INTRAVENOUS | Status: DC
  Administered 2018-11-04: 09:00:00 via INTRAVENOUS

## 2018-11-04 MED ORDER — PROPOFOL 10 MG/ML IV BOLUS
INTRAVENOUS | Status: DC | PRN
  Administered 2018-11-04: 30 mg via INTRAVENOUS

## 2018-11-04 MED ORDER — CEFAZOLIN SODIUM-DEXTROSE 2-4 GM/100ML-% IV SOLN
INTRAVENOUS | Status: AC
  Filled 2018-11-04: qty 100

## 2018-11-04 MED ORDER — BUPIVACAINE-EPINEPHRINE (PF) 0.25% -1:200000 IJ SOLN
INTRAMUSCULAR | Status: AC
  Filled 2018-11-04: qty 30

## 2018-11-04 MED ORDER — SODIUM CHLORIDE 0.9 % IV SOLN
INTRAVENOUS | Status: DC | PRN
  Administered 2018-11-04 (×2): 4 mL via INTRAMUSCULAR

## 2018-11-04 MED ORDER — HEPARIN SODIUM (PORCINE) 5000 UNIT/ML IJ SOLN
INTRAMUSCULAR | Status: AC
  Filled 2018-11-04: qty 1

## 2018-11-04 MED ORDER — PROPOFOL 500 MG/50ML IV EMUL
INTRAVENOUS | Status: DC | PRN
  Administered 2018-11-04: 50 ug/kg/min via INTRAVENOUS

## 2018-11-04 MED ORDER — PHENYLEPHRINE HCL 10 MG/ML IJ SOLN
INTRAMUSCULAR | Status: DC | PRN
  Administered 2018-11-04 (×2): 80 ug via INTRAVENOUS

## 2018-11-04 SURGICAL SUPPLY — 34 items
BAG DECANTER FOR FLEXI CONT (MISCELLANEOUS) ×2 IMPLANT
BLADE SURG SZ11 CARB STEEL (BLADE) ×2 IMPLANT
BOOT SUTURE AID YELLOW STND (SUTURE) ×2 IMPLANT
CANISTER SUCT 1200ML W/VALVE (MISCELLANEOUS) ×2 IMPLANT
CHLORAPREP W/TINT 26ML (MISCELLANEOUS) ×2 IMPLANT
COVER LIGHT HANDLE STERIS (MISCELLANEOUS) ×4 IMPLANT
COVER WAND RF STERILE (DRAPES) ×2 IMPLANT
DERMABOND ADVANCED (GAUZE/BANDAGES/DRESSINGS) ×1
DERMABOND ADVANCED .7 DNX12 (GAUZE/BANDAGES/DRESSINGS) ×1 IMPLANT
DRAPE C-ARM XRAY 36X54 (DRAPES) ×2 IMPLANT
DRAPE INCISE IOBAN 66X45 STRL (DRAPES) ×2 IMPLANT
DRAPE SHEET LG 3/4 BI-LAMINATE (DRAPES) ×2 IMPLANT
ELECT REM PT RETURN 9FT ADLT (ELECTROSURGICAL) ×2
ELECTRODE REM PT RTRN 9FT ADLT (ELECTROSURGICAL) ×1 IMPLANT
GEL ULTRASOUND 20GR AQUASONIC (MISCELLANEOUS) ×2 IMPLANT
GLOVE BIO SURGEON STRL SZ7 (GLOVE) ×8 IMPLANT
GOWN STRL REUS W/ TWL LRG LVL3 (GOWN DISPOSABLE) ×3 IMPLANT
GOWN STRL REUS W/TWL LRG LVL3 (GOWN DISPOSABLE) ×3
IV NS 500ML (IV SOLUTION) ×1
IV NS 500ML BAXH (IV SOLUTION) ×1 IMPLANT
KIT PORT POWER 8FR ISP CVUE (Port) ×2 IMPLANT
NEEDLE HYPO 22GX1.5 SAFETY (NEEDLE) ×2 IMPLANT
NS IRRIG 1000ML POUR BTL (IV SOLUTION) ×2 IMPLANT
PACK PORT-A-CATH (MISCELLANEOUS) ×2 IMPLANT
SPONGE LAP 18X18 RF (DISPOSABLE) ×2 IMPLANT
SUT MNCRL AB 4-0 PS2 18 (SUTURE) ×2 IMPLANT
SUT PROLENE 2-0 (SUTURE) ×1
SUT PROLENE 2-0 RB1 36X2 ARM (SUTURE) ×1
SUT VIC AB 3-0 SH 27 (SUTURE) ×1
SUT VIC AB 3-0 SH 27X BRD (SUTURE) ×1 IMPLANT
SUTURE PROLEN 2-0 RB1 36X2 ARM (SUTURE) ×1 IMPLANT
SYR 20CC LL (SYRINGE) ×2 IMPLANT
SYR 5ML LL (SYRINGE) ×2 IMPLANT
TOWEL OR 17X26 4PK STRL BLUE (TOWEL DISPOSABLE) ×2 IMPLANT

## 2018-11-04 NOTE — Anesthesia Preprocedure Evaluation (Addendum)
Anesthesia Evaluation  Patient identified by MRN, date of birth, ID band Patient awake    Reviewed: Allergy & Precautions, NPO status , Patient's Chart, lab work & pertinent test results  History of Anesthesia Complications (+) history of anesthetic complications ("rough teeth" post -op)  Airway Mallampati: III       Dental   Pulmonary asthma , neg sleep apnea,           Cardiovascular hypertension, Pt. on medications (-) Past MI and (-) CHF (-) dysrhythmias (-) Valvular Problems/Murmurs     Neuro/Psych Anxiety Depression CVA (memory difficulties)    GI/Hepatic Neg liver ROS, GERD  Medicated and Controlled,  Endo/Other  neg diabetes  Renal/GU negative Renal ROS     Musculoskeletal   Abdominal   Peds  Hematology   Anesthesia Other Findings   Reproductive/Obstetrics                            Anesthesia Physical Anesthesia Plan  ASA: III  Anesthesia Plan: General   Post-op Pain Management:    Induction:   PONV Risk Score and Plan: 3 and Propofol infusion, TIVA and Midazolam  Airway Management Planned: Nasal Cannula  Additional Equipment:   Intra-op Plan:   Post-operative Plan:   Informed Consent: I have reviewed the patients History and Physical, chart, labs and discussed the procedure including the risks, benefits and alternatives for the proposed anesthesia with the patient or authorized representative who has indicated his/her understanding and acceptance.     Plan Discussed with:   Anesthesia Plan Comments:         Anesthesia Quick Evaluation

## 2018-11-04 NOTE — Interval H&P Note (Signed)
History and Physical Interval Note:  46/07/320 22:48 AM  Molly [name]  has presented today for surgery, with the diagnosis of ENDOMETRIAL CANCER  The various methods of treatment have been discussed with the patient and family. After consideration of risks, benefits and other options for treatment, the patient has consented to  Procedure(s): INSERTION PORT-A-CATH (N/A) as a surgical intervention .  The patient's history has been reviewed, patient examined, no change in status, stable for surgery.  I have reviewed the patient's chart and labs.  Questions were answered to the patient's satisfaction.     [place]

## 2018-11-04 NOTE — Transfer of Care (Signed)
Immediate Anesthesia Transfer of Care Note  Patient: Molly [name]  Procedure(s) Performed: INSERTION PORT-A-CATH (N/A )  Patient Location: PACU  Anesthesia Type:General  Level of Consciousness: awake  Airway & Oxygen Therapy: Patient Spontanous Breathing  Post-op Assessment: Report given to RN  Post vital signs: stable  Last Vitals:  Vitals Value Taken Time  BP 128/71 11/04/2018  1:05 PM  Temp 35.9 C 11/04/2018  1:05 PM  Pulse 64 11/04/2018  1:07 PM  Resp 14 11/04/2018  1:07 PM  SpO2 94 % 11/04/2018  1:07 PM  Vitals shown include unvalidated device data.  Last Pain:  Vitals:   11/04/18 1305  TempSrc:   PainSc: Asleep         Complications: No apparent anesthesia complications

## 2018-11-04 NOTE — Anesthesia Post-op Follow-up Note (Signed)
Anesthesia QCDR form completed.        

## 2018-11-04 NOTE — Anesthesia Postprocedure Evaluation (Signed)
Anesthesia Post Note  Patient: Molly [name]  Procedure(s) Performed: INSERTION PORT-A-CATH (N/A )  Patient location during evaluation: PACU Anesthesia Type: General Level of consciousness: awake and alert Pain management: pain level controlled Vital Signs Assessment: post-procedure vital signs reviewed and stable Respiratory status: spontaneous breathing and respiratory function stable Cardiovascular status: stable Anesthetic complications: no     Last Vitals:  Vitals:   11/04/18 1400 11/04/18 1449  BP: (!) 157/71 (!) 146/68  Pulse: (!) 57 (!) 51  Resp: 18 16  Temp: (!) 35.9 C   SpO2: 98% 97%    Last Pain:  Vitals:   11/04/18 1449  TempSrc:   PainSc: 2                  KEPHART,WILLIAM K

## 2018-11-04 NOTE — Interval H&P Note (Signed)
History and Physical Interval Note:  73/05/6814 94:70 AM  Molly [name]  has presented today for surgery, with the diagnosis of ENDOMETRIAL CANCER  The various methods of treatment have been discussed with the patient and family. After consideration of risks, benefits and other options for treatment, the patient has consented to  Procedure(s): INSERTION PORT-A-CATH (N/A) as a surgical intervention .  The patient's history has been reviewed, patient examined, no change in status, stable for surgery.  I have reviewed the patient's chart and labs.  Questions were answered to the patient's satisfaction.     [place]

## 2018-11-04 NOTE — Discharge Instructions (Addendum)
Implanted Port Insertion Implanted port insertion is a procedure to put in a port and catheter. The port is a device with an injectable disk that can be accessed by your health care provider. The port is connected to a vein in the chest or neck by a small flexible tube (catheter). There are different types of ports. The implanted port may be used as a long-term IV access for:  Medicines, such as chemotherapy.  Fluids.  Liquid nutrition, such as total parenteral nutrition (TPN).  Blood samples.   Having a port means that your health care provider will not need to use the veins in your arms for these procedures. Tell a health care provider about:  Any allergies you have.  All medicines you are taking, especially blood thinners, as well as any vitamins, herbs, eye drops, creams, over-the-counter medicines, and steroids.  Any problems you or family members have had with anesthetic medicines.  Any blood disorders you have.  Any surgeries you have had.  Any medical conditions you have, including diabetes or kidney problems.  Whether you are pregnant or may be pregnant. What are the risks? Generally, this is a safe procedure. However, problems may occur, including:  Allergic reactions to medicines or dyes.  Damage to other structures or organs.  Infection.  Damage to the blood vessel, bruising, or bleeding at the puncture site.  Blood clot.  Breakdown of the skin over the port.  A collection of air in the chest that can cause one of the lungs to collapse (pneumothorax). This is rare.  What happens before the procedure? Staying hydrated Follow instructions from your health care provider about hydration, which may include:  Up to 2 hours before the procedure - you may continue to drink clear liquids, such as water, clear fruit juice, black coffee, and plain tea.  Eating and drinking restrictions  Follow instructions from your health care provider about eating and  drinking, which may include: ? 8 hours before the procedure - stop eating heavy meals or foods such as meat, fried foods, or fatty foods. ? 6 hours before the procedure - stop eating light meals or foods, such as toast or cereal. ? 6 hours before the procedure - stop drinking milk or drinks that contain milk. ? 2 hours before the procedure - stop drinking clear liquids. Medicines  Ask your health care provider about: ? Changing or stopping your regular medicines. This is especially important if you are taking diabetes medicines or blood thinners. ? Taking medicines such as aspirin and ibuprofen. These medicines can thin your blood. Do not take these medicines before your procedure if your health care provider instructs you not to.  You may be given antibiotic medicine to help prevent infection. General instructions  Plan to have someone take you home from the hospital or clinic.  If you will be going home right after the procedure, plan to have someone with you for 24 hours.  You may have blood tests.  You may be asked to shower with a germ-killing soap. What happens during the procedure?  To lower your risk of infection: ? Your health care team will wash or sanitize their hands. ? Your skin will be washed with soap. ? Hair may be removed from the surgical area.  An IV tube will be inserted into one of your veins.  You will be given one or more of the following: ? A medicine to help you relax (sedative). ? A medicine to numb the area (local  anesthetic).  Two small cuts (incisions) will be made to insert the port. ? One incision will be made in your neck to get access to the vein where the catheter will lie. ? The other incision will be made in the upper chest. This is where the port will lie.  The procedure may be done using continuous X-ray (fluoroscopy) or other imaging tools for guidance.  The port and catheter will be placed. There may be a small, raised area where the  port is.  The port will be flushed with a salt solution (saline), and blood will be drawn to make sure that it is working correctly.  The incisions will be closed.  Bandages (dressings) may be placed over the incisions. The procedure may vary among health care providers and hospitals. What happens after the procedure?  Your blood pressure, heart rate, breathing rate, and blood oxygen level will be monitored until the medicines you were given have worn off.  Do not drive for 24 hours if you were given a sedative.  You will be given a manufacturer's information card for the type of port that you have. Keep this with you.  Your port will need to be flushed and checked as told by your health care provider, usually every few weeks.  A chest X-ray will be done to: ? Check the placement of the port. ? Make sure there is no injury to your lung. Summary  Implanted port insertion is a procedure to put in a port and catheter.  The implanted port is used as a long-term IV access.  The port will need to be flushed and checked as told by your health care provider, usually every few weeks.  Keep your manufacturer's information card with you at all times. This information is not intended to replace advice given to you by your health care provider. Make sure you discuss any questions you have with your health care provider. Document Released: 10/05/2013 Document Revised: 11/05/2016 Document Reviewed: 11/05/2016 Elsevier Interactive Patient Education  2017 [employer]   1) The drugs that you were given will stay in your system until tomorrow so for the next 24 hours you should not:  A) Drive an automobile B) Make any legal decisions C) Drink any alcoholic beverage   2) You may resume regular meals tomorrow.  Today it is better to start with liquids and gradually work up to solid foods.  You may eat anything you prefer, but it is better to  start with liquids, then soup and crackers, and gradually work up to solid foods.   3) Please notify your doctor immediately if you have any unusual bleeding, trouble breathing, redness and pain at the surgery site, drainage, fever, or pain not relieved by medication.    4) Additional Instructions: TAKE A STOOL SOFTENER TWICE A DAY WHILE TAKING NARCOTIC PAIN MEDICINE TO PREVENT CONSTIPATION   Please contact your physician with any problems or Same Day Surgery at [phone], Monday through Friday 6 am to 4 pm, or [place] at [facility] number at [phone].

## 2018-11-04 NOTE — Op Note (Signed)
  Pre-operative Diagnosis: Endometrial CA  Post-operative Diagnosis: same   Surgeon: Caroleen Hamman, MD FACS  Anesthesia: IV sedation, marcaine .25% w epi and lidocaine 1%  Procedure: Right IJ  Port placement with fluoroscopic and U/S guidance  Findings: Good position of the tip of the catheter by fluoroscopy  Estimated Blood Loss: Minimal         Drains: None         Specimens: None       Complications: none         Assistant: Dr. Celine Ahr  Procedure Details  The patient was seen again in the Holding Room. The benefits, complications, treatment options, and expected outcomes were discussed with the patient. The risks of bleeding, infection, recurrence of symptoms, failure to resolve symptoms,  thrombosis nonfunction breakage pneumothorax hemopneumothorax any of which could require chest tube or further surgery were reviewed with the patient.   The patient was taken to Operating Room, identified as Molly [name] and the procedure verified.  A Time Out was held and the above information confirmed.  Prior to the induction of general anesthesia, antibiotic prophylaxis was administered. VTE prophylaxis was in place. Appropriate anesthesia was then administered and tolerated well. The chest was prepped with Chloraprep and draped in the sterile fashion. The patient was positioned in the supine position. Then the patient was placed in Trendelenburg position.  Patient was prepped and draped in sterile fashion and in a Trendelenburg position local anesthetic was infiltrated into the skin and subcutaneous tissues in the neck and anterior chest wall. The large bore needle was placed into the internal jugular vein under U/S guidance without difficulty and then using the Seldinger technique the wire was advanced. Fluoroscopy was utilized to confirm that the  wire was in the superior vena cava.  An incision was made and a port pocket developed with blunt and electrocautery dissection. The introducer  dilator was placed over the Seldinger wire, the wire was removed. The previously flushed catheter was placed into the introducer dilator and the peel-away sheath was removed. The catheter length was confirmed and trimmed utilizing fluoroscopy for proper positioning. The catheter was then attached to the previously flushed port. The port was placed into the pocket. The port was held in with 2-0 Prolenes and flushed for function and heparin locked.  The wound was closed with interrupted 3-0 Vicryl followed by 4-0 subcuticular Monocryl sutures. Dermabond used to coat the skin  Patient was taken to the recovery room in stable condition where a postoperative chest film has been ordered.

## 2018-11-05 ENCOUNTER — Inpatient Hospital Stay: Payer: Medicare Other

## 2018-11-05 ENCOUNTER — Other Ambulatory Visit: Payer: Self-pay | Admitting: Family Medicine

## 2018-11-05 ENCOUNTER — Other Ambulatory Visit: Payer: Self-pay | Admitting: Nurse Practitioner

## 2018-11-05 ENCOUNTER — Inpatient Hospital Stay (HOSPITAL_BASED_OUTPATIENT_CLINIC_OR_DEPARTMENT_OTHER): Payer: Medicare Other | Admitting: Oncology

## 2018-11-05 ENCOUNTER — Encounter: Payer: Self-pay | Admitting: Surgery

## 2018-11-05 ENCOUNTER — Other Ambulatory Visit: Payer: Self-pay

## 2018-11-05 VITALS — BP 155/94 | HR 49 | Temp 97.9°F | Resp 18 | Ht 59.0 in | Wt 213.0 lb

## 2018-11-05 VITALS — BP 158/87 | HR 53 | Temp 97.9°F | Resp 18

## 2018-11-05 DIAGNOSIS — C778 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: Secondary | ICD-10-CM

## 2018-11-05 DIAGNOSIS — Z5111 Encounter for antineoplastic chemotherapy: Secondary | ICD-10-CM | POA: Diagnosis not present

## 2018-11-05 DIAGNOSIS — D649 Anemia, unspecified: Secondary | ICD-10-CM | POA: Diagnosis not present

## 2018-11-05 DIAGNOSIS — C541 Malignant neoplasm of endometrium: Secondary | ICD-10-CM | POA: Diagnosis present

## 2018-11-05 DIAGNOSIS — Z90722 Acquired absence of ovaries, bilateral: Secondary | ICD-10-CM | POA: Diagnosis not present

## 2018-11-05 DIAGNOSIS — D72829 Elevated white blood cell count, unspecified: Secondary | ICD-10-CM | POA: Diagnosis not present

## 2018-11-05 DIAGNOSIS — D509 Iron deficiency anemia, unspecified: Secondary | ICD-10-CM | POA: Diagnosis not present

## 2018-11-05 DIAGNOSIS — Z90710 Acquired absence of both cervix and uterus: Secondary | ICD-10-CM | POA: Diagnosis not present

## 2018-11-05 LAB — CBC WITH DIFFERENTIAL/PLATELET
Abs Immature Granulocytes: 0.01 10*3/uL (ref 0.00–0.07)
BASOS ABS: 0 10*3/uL (ref 0.0–0.1)
Basophils Relative: 0 %
Eosinophils Absolute: 0.1 10*3/uL (ref 0.0–0.5)
Eosinophils Relative: 2 %
HEMATOCRIT: 34.2 % — AB (ref 36.0–46.0)
HEMOGLOBIN: 10.9 g/dL — AB (ref 12.0–15.0)
IMMATURE GRANULOCYTES: 0 %
LYMPHS ABS: 1.8 10*3/uL (ref 0.7–4.0)
LYMPHS PCT: 40 %
MCH: 28.5 pg (ref 26.0–34.0)
MCHC: 31.9 g/dL (ref 30.0–36.0)
MCV: 89.3 fL (ref 80.0–100.0)
Monocytes Absolute: 0.4 10*3/uL (ref 0.1–1.0)
Monocytes Relative: 8 %
NEUTROS PCT: 50 %
NRBC: 0 % (ref 0.0–0.2)
Neutro Abs: 2.3 10*3/uL (ref 1.7–7.7)
Platelets: 289 10*3/uL (ref 150–400)
RBC: 3.83 MIL/uL — AB (ref 3.87–5.11)
RDW: 12.4 % (ref 11.5–15.5)
WBC: 4.6 10*3/uL (ref 4.0–10.5)

## 2018-11-05 LAB — COMPREHENSIVE METABOLIC PANEL
ALT: 16 U/L (ref 0–44)
ANION GAP: 9 (ref 5–15)
AST: 21 U/L (ref 15–41)
Albumin: 3.8 g/dL (ref 3.5–5.0)
Alkaline Phosphatase: 73 U/L (ref 38–126)
BUN: 11 mg/dL (ref 8–23)
CHLORIDE: 104 mmol/L (ref 98–111)
CO2: 29 mmol/L (ref 22–32)
CREATININE: 0.73 mg/dL (ref 0.44–1.00)
Calcium: 8.7 mg/dL — ABNORMAL LOW (ref 8.9–10.3)
GFR calc Af Amer: >60 mL/min (ref >60)
Glucose, Bld: 105 mg/dL — ABNORMAL HIGH (ref 70–99)
POTASSIUM: 3.9 mmol/L (ref 3.5–5.1)
SODIUM: 142 mmol/L (ref 135–145)
Total Bilirubin: 0.7 mg/dL (ref 0.3–1.2)
Total Protein: 6.9 g/dL (ref 6.5–8.1)

## 2018-11-05 LAB — FOLATE: FOLATE: 8.7 ng/mL (ref >5.9)

## 2018-11-05 LAB — FERRITIN: FERRITIN: 25 ng/mL (ref 11–307)

## 2018-11-05 LAB — IRON AND TIBC
Iron: 60 ug/dL (ref 28–170)
SATURATION RATIOS: 19 % (ref 10.4–31.8)
TIBC: 312 ug/dL (ref 250–450)
UIBC: 252 ug/dL

## 2018-11-05 LAB — VITAMIN B12: Vitamin B-12: 443 pg/mL (ref 180–914)

## 2018-11-05 MED ORDER — LORATADINE 10 MG PO TABS: 10.0000 mg | ORAL_TABLET | Freq: Every day | ORAL | 0 refills | Status: DC

## 2018-11-05 MED ORDER — SODIUM CHLORIDE 0.9 % IV SOLN
Freq: Once | INTRAVENOUS | Status: AC
  Administered 2018-11-05: 10:00:00 via INTRAVENOUS
  Filled 2018-11-05: qty 250

## 2018-11-05 MED ORDER — HEPARIN SOD (PORK) LOCK FLUSH 100 UNIT/ML IV SOLN
500.0000 [IU] | Freq: Once | INTRAVENOUS | Status: AC
  Administered 2018-11-05: 500 [IU] via INTRAVENOUS

## 2018-11-05 MED ORDER — SODIUM CHLORIDE 0.9 % IV SOLN
175.0000 mg/m2 | Freq: Once | INTRAVENOUS | Status: AC
  Administered 2018-11-05: 348 mg via INTRAVENOUS
  Filled 2018-11-05: qty 58

## 2018-11-05 MED ORDER — SODIUM CHLORIDE 0.9 % IV SOLN
20.0000 mg | Freq: Once | INTRAVENOUS | Status: AC
  Administered 2018-11-05: 20 mg via INTRAVENOUS
  Filled 2018-11-05: qty 2

## 2018-11-05 MED ORDER — FAMOTIDINE IN NACL 20-0.9 MG/50ML-% IV SOLN
20.0000 mg | Freq: Once | INTRAVENOUS | Status: AC
  Administered 2018-11-05: 20 mg via INTRAVENOUS
  Filled 2018-11-05: qty 50

## 2018-11-05 MED ORDER — DIPHENHYDRAMINE HCL 50 MG/ML IJ SOLN
50.0000 mg | Freq: Once | INTRAMUSCULAR | Status: AC
  Administered 2018-11-05: 50 mg via INTRAVENOUS
  Filled 2018-11-05: qty 1

## 2018-11-05 MED ORDER — SODIUM CHLORIDE 0.9 % IV SOLN
540.0000 mg | Freq: Once | INTRAVENOUS | Status: AC
  Administered 2018-11-05: 540 mg via INTRAVENOUS
  Filled 2018-11-05: qty 54

## 2018-11-05 MED ORDER — PEGFILGRASTIM 6 MG/0.6ML ~~LOC~~ PSKT
6.0000 mg | PREFILLED_SYRINGE | Freq: Once | SUBCUTANEOUS | Status: AC
  Administered 2018-11-05: 6 mg via SUBCUTANEOUS
  Filled 2018-11-05: qty 0.6

## 2018-11-05 MED ORDER — PALONOSETRON HCL INJECTION 0.25 MG/5ML
0.2500 mg | Freq: Once | INTRAVENOUS | Status: AC
  Administered 2018-11-05: 0.25 mg via INTRAVENOUS
  Filled 2018-11-05: qty 5

## 2018-11-05 NOTE — Progress Notes (Signed)
Proceed with tx today per MD

## 2018-11-05 NOTE — Progress Notes (Signed)
Pain noted to port site ( 3)

## 2018-11-05 NOTE — Progress Notes (Signed)
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 Marcelline Mates for cystocele and rectocele.  She was noted to have postmenopausal bleeding and cramping recently.  Ultrasound revealed heterogeneous appearance of uterus with fibroids invading the endometrium.  She underwent an endometrial biopsy when she had a second bout of postmenopausal bleeding.  Biopsy showed endometrioid adenocarcinoma FIGO grade 1.  She was then seen by Dr. Fransisca Connors and plan was to proceed with laparoscopic hysterectomy and bilateral salpingo-oophorectomy along with pelvic lymph node sampling  Final pathology showed: Endometrioid carcinoma FIGO grade 2 with 33% myometrial invasion.  Lymphovascular invasion present.  Peritoneal/ascites fluid atypical.  2 out of 3 sentinel lymph nodes were positive for macro metastases.  pT1a pN1a.  FIGO IIIC1  Patient's case was discussed at tumor board and consensus was to proceed with 3 cycles of adjuvant carbotaxol followed by radiation followed by 3 more cycles of chemotherapy.  Patient is already met with Dr. Donella Stade who will be giving radiation therapy to pelvic and periaortic lymph nodes and boost to her vaginal cuff.  PET CT scan is scheduled for tomorrow.  Interval history-no changes since last visit.  Patient has a port in place.  Her appetite is fair and she denies any  unintentional weight loss.  Her bowel movements are regular.  She denies any abdominal pain  ECOG PS- 1 Pain scale- 0 Opioid associated constipation- no  Review of systems- Review of Systems  Constitutional: Positive for malaise/fatigue. Negative for chills, fever and weight loss.  HENT: Negative for congestion, ear discharge and nosebleeds.   Eyes: Negative for blurred vision.  Respiratory: Negative for cough, hemoptysis, sputum production, shortness of breath and wheezing.   Cardiovascular: Negative for chest pain, palpitations, orthopnea and claudication.  Gastrointestinal: Negative for abdominal pain, blood in stool, constipation, diarrhea, heartburn, melena, nausea and vomiting.  Genitourinary: Negative for dysuria, flank pain, frequency, hematuria and urgency.  Musculoskeletal: Negative for back pain, joint pain and myalgias.  Skin: Negative for rash.  Neurological: Negative for dizziness, tingling, focal weakness, seizures, weakness and headaches.  Endo/Heme/Allergies: Does not bruise/bleed easily.  Psychiatric/Behavioral: Negative for depression and suicidal ideas. The patient does not have insomnia.       Allergies  Allergen Reactions  . Nsaids Hives and Rash  . Statins Other (See Comments)    Joint pains  . Victoza [Liraglutide] Other (See Comments)    pancreatitis  . Oxycodone Nausea And Vomiting  . Crestor [Rosuvastatin Calcium] Rash     Past Medical History:  Diagnosis Date  . Anxiety   . Asthma    history of asthma  . Cardiomyopathy ([place])   . Complication of anesthesia    tore hair out and made teeth rough  . Depression   . Endometrial cancer ([place]) 08/2018  . GERD (gastroesophageal reflux disease)    takes prilosec prn  . History of CVA (cerebrovascular accident) 12/10/2015  . Hyperlipidemia LDL goal <70 12/10/2015  . Hypertension   .  Prediabetes 10/02/2017   A1c 6 in January 2018  . Stroke [facility]) 1990    no residual effects     Past Surgical  History:  Procedure Laterality Date  . ABDOMINAL HYSTERECTOMY    . CHOLECYSTECTOMY    . COLONOSCOPY WITH PROPOFOL N/A 01/08/2017   Procedure: COLONOSCOPY WITH PROPOFOL;  Surgeon: [name], MD;  Location: [facility] ENDOSCOPY;  Service: Endoscopy;  Laterality: N/A;  . COLONOSCOPY WITH PROPOFOL N/A 05/05/2018   Procedure: COLONOSCOPY WITH PROPOFOL;  Surgeon: [name], MD;  Location: [facility] ENDOSCOPY;  Service: Gastroenterology;  Laterality: N/A;  . HERNIA REPAIR  [id number]   umbilical  . PORTACATH PLACEMENT N/A 11/04/2018   Procedure: INSERTION PORT-A-CATH;  Surgeon: Jules Husbands, MD;  Location: [facility] ORS;  Service: General;  Laterality: N/A;  . SENTINEL NODE BIOPSY N/A 10/06/2018   Procedure: SENTINEL NODE BIOPSY;  Surgeon: Mellody Drown, MD;  Location: [facility] ORS;  Service: Gynecology;  Laterality: N/A;  . UMBILICAL HERNIA REPAIR N/A 11/17/2017   Procedure: HERNIA REPAIR UMBILICAL ADULT;  Surgeon: Jules Husbands, MD;  Location: [facility] ORS;  Service: General;  Laterality: N/A;    Social History   Socioeconomic History  . Marital status: Divorced    Spouse name: Not on file  . Number of children: 2  . Years of education: some college  . Highest education level: 12th grade  Occupational History  . 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 Occupation: currently a Careers adviser  . Financial resource strain: Not hard at all  . Food insecurity:    Worry: Never true    Inability: Never true  . Transportation needs:    Medical: No    Non-medical: No  Tobacco Use  . Smoking status: Never Smoker  . Smokeless tobacco: Never Used  . Tobacco comment: smoking cessation materials not required  Substance and Sexual Activity  . Alcohol use: No    Alcohol/week: 0.0 standard drinks  . Drug use: No  . Sexual activity: Not Currently  Lifestyle  . Physical activity:    Days per week: 0 days    Minutes per session: 0 min  . Stress: Not at all  Relationships  . Social  connections:    Talks on phone: More than three times a week    Gets together: Three times a week    Attends religious service: More than 4 times per year    Active member of club or organization: Yes    Attends meetings of clubs or organizations: More than 4 times per year    Relationship status: Divorced  . Intimate partner violence:    Fear of current or ex partner: No    Emotionally abused: No    Physically abused: No    Forced sexual activity: No  Other Topics Concern  . Not on file  Social History Narrative  . Not on file    Family History  Problem Relation Age of Onset  . Stroke Mother   . Hypertension Mother   . Dementia Mother   . Alzheimer's disease Mother   . Gout Father   . Asthma Father   . Hypertension Father   . Dementia Father   . Healthy Sister   . Stroke Brother   . Alzheimer's disease Brother   . Healthy Daughter   . Hypertension Brother   . Healthy Brother   . Cancer Paternal Grandmother   . Healthy Sister   .  Healthy Sister   . Hypertension Sister   . Hypertension Sister   . Stroke Brother   . Alzheimer's disease Brother   . Stroke Brother   . Hypertension Brother   . Stroke Brother   . Hypertension Brother   . Healthy Brother   . Healthy Brother   . Hypertension Daughter   . Breast cancer Neg Hx      Current Outpatient Medications:  .  escitalopram (LEXAPRO) 5 MG tablet, Take 1 tablet (5 mg total) by mouth daily., Disp: 90 tablet, Rfl: 0 .  ezetimibe (ZETIA) 10 MG tablet, Take 1 tablet (10 mg total) by mouth daily., Disp: 90 tablet, Rfl: 1 .  furosemide (LASIX) 40 MG tablet, Take 1 tablet (40 mg total) by mouth daily. (Patient taking differently: Take 40 mg by mouth daily as needed for fluid. ), Disp: 90 tablet, Rfl: 1 .  loratadine (CLARITIN) 10 MG tablet, Take 1 tablet (10 mg total) by mouth daily., Disp: , Rfl:  .  olmesartan-hydrochlorothiazide (BENICAR HCT) 20-12.5 MG tablet, Take 1 tablet by mouth daily., Disp: 90 tablet, Rfl: 1 .   omeprazole (PRILOSEC OTC) 20 MG tablet, Take 20 mg by mouth daily as needed., Disp: , Rfl:  .  traMADol (ULTRAM) 50 MG tablet, Take 1 tablet (50 mg total) by mouth every 6 (six) hours as needed. (Patient taking differently: Take 50 mg by mouth every 6 (six) hours as needed for moderate pain. ), Disp: 30 tablet, Rfl: 0 .  acetaminophen (TYLENOL) 500 MG tablet, Take 2 tablets (1,000 mg total) by mouth every 6 (six) hours as needed for mild pain or moderate pain. (Patient not taking: Reported on 11/05/2018), Disp: 30 tablet, Rfl: 0 .  albuterol (PROVENTIL HFA;VENTOLIN HFA) 108 (90 Base) MCG/ACT inhaler, Inhale 2 puffs into the lungs every 4 (four) hours as needed for wheezing or shortness of breath. (Patient not taking: Reported on 11/05/2018), Disp: 1 Inhaler, Rfl: 0 .  dexamethasone (DECADRON) 4 MG tablet, Take 2 tablets (8 mg total) by mouth daily. Start the day after chemotherapy for 2 days. (Patient not taking: Reported on 11/05/2018), Disp: 30 tablet, Rfl: 1 .  diclofenac sodium (VOLTAREN) 1 % GEL, Apply 2 g topically 3 (three) times daily as needed (pain). (Patient not taking: Reported on 11/05/2018), Disp: 100 g, Rfl: 1 .  docusate sodium (COLACE) 100 MG capsule, Take 1 capsule (100 mg total) by mouth 2 (two) times daily as needed for mild constipation. (Patient not taking: Reported on 11/05/2018), Disp: 30 capsule, Rfl: 2 .  ketoconazole (NIZORAL) 2 % cream, Apply 1 application topically daily as needed for irritation. On abdominal fold prn (Patient not taking: Reported on 11/02/2018), Disp: 60 g, Rfl: 0 .  lidocaine-prilocaine (EMLA) cream, Apply to affected area once (Patient not taking: Reported on 11/05/2018), Disp: 30 g, Rfl: 3 .  LORazepam (ATIVAN) 0.5 MG tablet, Take 1 tablet (0.5 mg total) by mouth every 6 (six) hours as needed (Nausea or vomiting). (Patient not taking: Reported on 11/05/2018), Disp: 30 tablet, Rfl: 0 .  metFORMIN (GLUCOPHAGE-XR) 750 MG 24 hr tablet, Take 1 tablet (750 mg total) by  mouth daily with breakfast. (Patient not taking: Reported on 11/02/2018), Disp: 90 tablet, Rfl: 1 .  ondansetron (ZOFRAN) 8 MG tablet, Take 1 tablet (8 mg total) by mouth 2 (two) times daily as needed for refractory nausea / vomiting. Start on day 3 after chemo. (Patient not taking: Reported on 11/05/2018), Disp: 30 tablet, Rfl: 1 .  prochlorperazine (COMPAZINE)  10 MG tablet, Take 1 tablet (10 mg total) by mouth every 6 (six) hours as needed (Nausea or vomiting). (Patient not taking: Reported on 11/05/2018), Disp: 30 tablet, Rfl: 1 No current facility-administered medications for this visit.   Facility-Administered Medications Ordered in Other Visits:  .  CARBOplatin (PARAPLATIN) 540 mg in sodium chloride 0.9 % 250 mL chemo infusion, 540 mg, Intravenous, Once, Sindy Guadeloupe, MD .  PACLitaxel (TAXOL) 348 mg in sodium chloride 0.9 % 500 mL chemo infusion (> 59m/m2), 175 mg/m2 (Treatment Plan Recorded), Intravenous, Once, RSindy Guadeloupe MD, Last Rate: 186 mL/hr at 11/05/18 1106, 348 mg at 11/05/18 1106  Physical exam:  Vitals:   11/05/18 0844  BP: (!) 155/94  Pulse: (!) 49  Resp: 18  Temp: 97.9 F (36.6 C)  TempSrc: Tympanic  SpO2: 97%  Weight: 213 lb (96.6 kg)  Height: 4' 11"  (1.499 m)   Physical Exam  Constitutional: She is oriented to person, place, and time.  Patient is obese.  Does not appear to be in any acute distress  HENT:  Head: Normocephalic and atraumatic.  Eyes: Pupils are equal, round, and reactive to light. EOM are normal.  Neck: Normal range of motion.  Cardiovascular: Normal rate, regular rhythm and normal heart sounds.  Pulmonary/Chest: Effort normal and breath sounds normal.  Abdominal: Soft. Bowel sounds are normal. She exhibits no distension. There is no tenderness.  Recent laparoscopy scars are well-healed  Neurological: She is alert and oriented to person, place, and time.  Skin: Skin is warm and dry.     CMP Latest Ref Rng & Units 11/05/2018  Glucose 70 - 99  mg/dL 105(H)  BUN 8 - 23 mg/dL 11  Creatinine 0.44 - 1.00 mg/dL 0.73  Sodium 135 - 145 mmol/L 142  Potassium 3.5 - 5.1 mmol/L 3.9  Chloride 98 - 111 mmol/L 104  CO2 22 - 32 mmol/L 29  Calcium 8.9 - 10.3 mg/dL 8.7(L)  Total Protein 6.5 - 8.1 g/dL 6.9  Total Bilirubin 0.3 - 1.2 mg/dL 0.7  Alkaline Phos 38 - 126 U/L 73  AST 15 - 41 U/L 21  ALT 0 - 44 U/L 16   CBC Latest Ref Rng & Units 11/05/2018  WBC 4.0 - 10.5 K/uL 4.6  Hemoglobin 12.0 - 15.0 g/dL 10.9(L)  Hematocrit 36.0 - 46.0 % 34.2(L)  Platelets 150 - 400 K/uL 289    No images are attached to the encounter.  Ct Chest W Contrast  Result Date: 10/25/2018 CLINICAL DATA:  [facility] Positive metastatic nodes in the right external iliac and left obturator chains. Staging evaluation. EXAM: CT CHEST, ABDOMEN, AND PELVIS WITH CONTRAST TECHNIQUE: Multidetector CT imaging of the chest, abdomen and pelvis was performed following the standard protocol during bolus administration of intravenous contrast. CONTRAST:  1039mISOVUE-300 IOPAMIDOL (ISOVUE-300) INJECTION 61% COMPARISON:  04/07/2018 chest radiograph. 10/29/2017 CT abdomen/pelvis. FINDINGS: CT CHEST FINDINGS Cardiovascular: Top-normal heart size. No significant pericardial effusion/thickening. Left anterior descending coronary atherosclerosis. Atherosclerotic nonaneurysmal thoracic aorta. Normal caliber pulmonary arteries. No central pulmonary emboli. Mediastinum/Nodes: No discrete thyroid nodules. Unremarkable esophagus. No pathologically enlarged axillary, mediastinal or hilar lymph nodes. Lungs/Pleura: No pneumothorax. No pleural effusion. No acute consolidative airspace disease or lung masses. There are 2 scattered tiny pulmonary nodules, largest 2 mm in the left upper lobe (series 3/image 77). Musculoskeletal: No aggressive appearing focal osseous lesions. Moderate thoracic spondylosis. CT ABDOMEN PELVIS FINDINGS Hepatobiliary: Normal liver  with no liver mass. Cholecystectomy. No biliary ductal  dilatation. Pancreas: Normal, with no mass or duct dilation. Spleen: Normal size. No mass. Adrenals/Urinary Tract: Normal adrenals. Mild scarring in the anterior lower left kidney. No renal masses. No hydronephrosis. Normal bladder. Stomach/Bowel: Normal non-distended stomach. Normal caliber small bowel with no small bowel wall thickening. Appendix not discretely visualized. No pericecal inflammatory changes. Normal large bowel with no diverticulosis, large bowel wall thickening or pericolonic fat stranding. Vascular/Lymphatic: Atherosclerotic nonaneurysmal abdominal aorta. Patent portal, splenic, hepatic and renal veins. Enlarged low left para aortic 1.7 cm node (series 2/image 76). Mild right external iliac adenopathy up to 1.0 cm (series 2/image 94). Enlarged 1.2 cm left external iliac node (series 2/image 91). No additional pathologically enlarged lymph nodes in the abdomen or pelvis. Reproductive: No mass or fluid collection at the vaginal cuff. Right adnexal 2.2 x 2.1 cm simple fluid collection (series 2/image 80) and left adnexal 2.9 x 2.1 cm simple fluid collection (series 2/image 84). No solid adnexal masses. Other: No pneumoperitoneum. No ascites. Minimal fat stranding in the omentum (series 2/image 78) without discrete peritoneal nodules. Musculoskeletal: No aggressive appearing focal osseous lesions. Moderate lumbar spondylosis. IMPRESSION: 1. Low left para-aortic and bilateral external iliac lymphadenopathy compatible with metastatic disease. 2. Two tiny pulmonary nodules, largest 2 mm, indeterminate, for which follow-up chest CT is advised in 3-6 months. 3. Nonspecific minimal fat stranding in the omentum without discrete peritoneal nodularity, for which attention on follow-up CT abdomen/pelvis is advised. 4. No additional potential findings of metastatic disease. 5. Small simple fluid collections in the bilateral adnexal regions most compatible  with postsurgical seromas. 6. One vessel coronary atherosclerosis. 7.  Aortic Atherosclerosis (ICD10-I70.0). Electronically Signed   By: Ilona Sorrel M.D.   On: 10/25/2018 09:40   Ct Abdomen Pelvis W Contrast  Result Date: 10/25/2018 CLINICAL DATA:  Endometrial adenocarcinoma status post [facility] 10/06/2018. Positive metastatic nodes in the right external iliac and left obturator chains. Staging evaluation. EXAM: CT CHEST, ABDOMEN, AND PELVIS WITH CONTRAST TECHNIQUE: Multidetector CT imaging of the chest, abdomen and pelvis was performed following the standard protocol during bolus administration of intravenous contrast. CONTRAST:  145m ISOVUE-300 IOPAMIDOL (ISOVUE-300) INJECTION 61% COMPARISON:  04/07/2018 chest radiograph. 10/29/2017 CT abdomen/pelvis. FINDINGS: CT CHEST FINDINGS Cardiovascular: Top-normal heart size. No significant pericardial effusion/thickening. Left anterior descending coronary atherosclerosis. Atherosclerotic nonaneurysmal thoracic aorta. Normal caliber pulmonary arteries. No central pulmonary emboli. Mediastinum/Nodes: No discrete thyroid nodules. Unremarkable esophagus. No pathologically enlarged axillary, mediastinal or hilar lymph nodes. Lungs/Pleura: No pneumothorax. No pleural effusion. No acute consolidative airspace disease or lung masses. There are 2 scattered tiny pulmonary nodules, largest 2 mm in the left upper lobe (series 3/image 77). Musculoskeletal: No aggressive appearing focal osseous lesions. Moderate thoracic spondylosis. CT ABDOMEN PELVIS FINDINGS Hepatobiliary: Normal liver with no liver mass. Cholecystectomy. No biliary ductal dilatation. Pancreas: Normal, with no mass or duct dilation. Spleen: Normal size. No mass. Adrenals/Urinary Tract: Normal adrenals. Mild scarring in the anterior lower left kidney. No renal masses. No hydronephrosis. Normal bladder. Stomach/Bowel: Normal non-distended stomach. Normal caliber small bowel with no small bowel wall thickening.  Appendix not discretely visualized. No pericecal inflammatory changes. Normal large bowel with no diverticulosis, large bowel wall thickening or pericolonic fat stranding. Vascular/Lymphatic: Atherosclerotic nonaneurysmal abdominal aorta. Patent portal, splenic, hepatic and renal veins. Enlarged low left para aortic 1.7 cm node (series 2/image 76). Mild right external iliac adenopathy up to 1.0 cm (series 2/image 94). Enlarged 1.2 cm left external iliac node (series 2/image 91). No additional pathologically enlarged lymph nodes in the abdomen  or pelvis. Reproductive: No mass or fluid collection at the vaginal cuff. Right adnexal 2.2 x 2.1 cm simple fluid collection (series 2/image 80) and left adnexal 2.9 x 2.1 cm simple fluid collection (series 2/image 84). No solid adnexal masses. Other: No pneumoperitoneum. No ascites. Minimal fat stranding in the omentum (series 2/image 78) without discrete peritoneal nodules. Musculoskeletal: No aggressive appearing focal osseous lesions. Moderate lumbar spondylosis. IMPRESSION: 1. Low left para-aortic and bilateral external iliac lymphadenopathy compatible with metastatic disease. 2. Two tiny pulmonary nodules, largest 2 mm, indeterminate, for which follow-up chest CT is advised in 3-6 months. 3. Nonspecific minimal fat stranding in the omentum without discrete peritoneal nodularity, for which attention on follow-up CT abdomen/pelvis is advised. 4. No additional potential findings of metastatic disease. 5. Small simple fluid collections in the bilateral adnexal regions most compatible with postsurgical seromas. 6. One vessel coronary atherosclerosis. 7.  Aortic Atherosclerosis (ICD10-I70.0). Electronically Signed   By: Ilona Sorrel M.D.   On: 10/25/2018 09:40   Nm Pet Image Initial (pi) Skull Base To Thigh  Result Date: 10/29/2018 CLINICAL DATA:  Subsequent treatment strategy for endometrial carcinoma. EXAM: NUCLEAR MEDICINE PET SKULL BASE TO THIGH TECHNIQUE: 10.7 mCi F-18  FDG was injected intravenously. Full-ring PET imaging was performed from the skull base to thigh after the radiotracer. CT data was obtained and used for attenuation correction and anatomic localization. Fasting blood glucose: 94 mg/dl COMPARISON:  CT 10/22/2018 FINDINGS: Mediastinal blood pool activity: SUV max 2.1 NECK: No hypermetabolic lymph nodes in the neck. Incidental CT findings: None CHEST: No hypermetabolic mediastinal or hilar nodes. No suspicious pulmonary nodules on the CT scan. Incidental CT findings: none ABDOMEN/PELVIS: LEFT periaortic retroperitoneal enlarged lymph node measuring 19 mm (image 162/4) and has intense metabolic activity with SUV max equal 13.5. This is the highest (cephalad) metastatic lymph node in relation to the pelvis. Cystic lesion at the same level on the RIGHT adjacent to the IVC has metabolic activity along the superior margin with SUV max equal 9.3. This lesion measures 2.4 by 1.9 cm predominantly cystic. Hypermetabolic RIGHT external iliac lymph node on the RIGHT measures 10 mm (image 198/4 )with SUV max equal 7.1. No clear additional hypermetabolic pelvic nodes. No abnormal activity in liver. Activity along the ventral abdominal wall related recent laparoscopic surgery. Incidental CT findings: Post hysterectomy SKELETON: No focal hypermetabolic activity to suggest skeletal metastasis. Incidental CT findings: none IMPRESSION: 1. Metastatic adenopathy in the periaortic retroperitoneum and RIGHT external iliac chain. 2. No evidence of liver metastasis. 3. No evidence of metastatic disease outside of the abdomen pelvis. Electronically Signed   By: Suzy Bouchard M.D.   On: 10/29/2018 16:04   Dg Chest Port 1 View  Result Date: 11/04/2018 CLINICAL DATA:  Port-A-Cath placement, history of endometrial adenocarcinoma EXAM: PORTABLE CHEST 1 VIEW COMPARISON:  CT chest of 10/22/2018 and chest x-ray of 04/07/2018 FINDINGS: A right-sided Port-A-Cath is now present with the tip seen  to the expected SVC-RA junction. No pneumothorax is seen. Mild left basilar atelectasis is present. Mediastinal and hilar contours are unremarkable. Cardiomegaly is stable. No acute bony abnormality is seen. IMPRESSION: 1. Right-sided Port-A-Cath tip is seen to the expected SVC-RA junction. No pneumothorax. 2. Stable cardiomegaly. Electronically Signed   By: Ivar Drape M.D.   On: 11/04/2018 13:31   Dg C-arm 1-60 Min-no Report  Result Date: 11/04/2018 Fluoroscopy was utilized by the requesting physician.  No radiographic interpretation.     Assessment and plan- Patient is a 67 y.o. female  with invasive adenocarcinoma of the endometrium endometrioid subtype FIGO Stage IIIC2 pT1a N2 M0  PET CT scan shows evidence of hypermetabolic periaortic retroperitoneal adenopathy as well as external iliac adenopathy which would make this N2 disease stage III C2.  Plan is to proceed with cycle 1 of adjuvant carboplatin and Taxol chemotherapy today.  Carboplatin will be given with an AUC of 5 and Taxol at 175 mg/m with on pro-Neulasta support.  Again discussed risks and benefits of chemotherapy including all but not limited to fatigue, nausea, vomiting, risk of infections and hospitalization.  Risk of hair loss peripheral neuropathy and infusion reaction associated with Taxol.  Treatment will be given with a curative intent.  Patient understands and agrees to proceed.  After receiving 3 cycles of carbotaxol patient will receive radiation treatment followed by completing the remaining 3 cycles of carbotaxol.  She is anemic with a hemoglobin of 10.  I will plan to check iron studies B12 and folate with today's labs.  I will see her back in 10 days time to see how she is tolerating her chemotherapy and possible fluids with CBC and CMP  Cycle 2 of chemotherapy falls over Thanksgiving and will therefore be delayed by 3 days.  She will get it on 11/29/2018 in [place]  visit Diagnosis 1. Endometrial adenocarcinoma ([place])    2. Anemia, unspecified type      Dr. Randa Evens, MD, MPH [facility] [phone] 11/05/2018 12:04 PM

## 2018-11-05 NOTE — Progress Notes (Signed)
Recheck b/p 172/90 manually and pulse manually 50

## 2018-11-06 MED ORDER — TRAMADOL HCL 50 MG PO TABS: 50.0000 mg | ORAL_TABLET | Freq: Four times a day (QID) | ORAL | 0 refills | Status: DC | PRN

## 2018-11-08 NOTE — Telephone Encounter (Signed)
Spoke to patient to clarify. Prescription was sent to pharmacy by Dr. Ancil Boozer. Pharmacy did not have medication in stock to fill prescription. Advised patient to call pharmacy to see if medication in stock now and if not, they can transfer prescription to a different pharmacy. Advised patient to call [place] if she has any trouble getting medication or has questions.

## 2018-11-10 ENCOUNTER — Inpatient Hospital Stay (HOSPITAL_BASED_OUTPATIENT_CLINIC_OR_DEPARTMENT_OTHER): Payer: Medicare Other | Admitting: Obstetrics and Gynecology

## 2018-11-10 ENCOUNTER — Encounter: Payer: Self-pay | Admitting: Obstetrics and Gynecology

## 2018-11-10 VITALS — BP 131/83 | HR 73 | Temp 97.8°F | Resp 18 | Ht 59.0 in | Wt 213.0 lb

## 2018-11-10 DIAGNOSIS — C541 Malignant neoplasm of endometrium: Secondary | ICD-10-CM

## 2018-11-10 DIAGNOSIS — Z90710 Acquired absence of both cervix and uterus: Secondary | ICD-10-CM

## 2018-11-10 DIAGNOSIS — Z90722 Acquired absence of ovaries, bilateral: Secondary | ICD-10-CM

## 2018-11-10 NOTE — Progress Notes (Signed)
Gynecologic Oncology Interval Visit   Referring Provider: Dr. Marcelline Mates  Chief Complaint: FIGO Stage IIIc1 grade 2 endometrioid endometrial cancer  Subjective:  Molly [name] is a 67 y.o. female initially seen in consultation for Dr. Marcelline Mates, grade 1 endometrioid endometrial cancer, now s/p TLH-BSO with SLN mapping and biopsies on 10/06/18 with Dr. Fransisca Connors and Dr. Marcelline Mates at [facility], who returns to clinic today for post-op evaluation.   No complaints today. Got cycle 1 of chemotherapy last week.    Pathology:  DIAGNOSIS:  A. UTERUS WITH CERVIX AND BILATERAL FALLOPIAN TUBES AND OVARIES; TOTAL HYSTERECTOMY WITH BILATERAL SALPINGO-OOPHORECTOMY:  - ENDOMETRIAL ADENOCARCINOMA.  - SEE CANCER SUMMARY BELOW.  - CERVIX WITH NABOTHIAN CYSTS, CHRONIC CERVICITIS WITH SQUAMOUS METAPLASIA, AND 0.9 CM ENDOCERVICAL POLYP.  - INACTIVE BACKGROUND ENDOMETRIUM WITH ENDOMETRIAL POLYP.  - MYOMETRIUM WITH ADENOMYOSIS AND LEIOMYOMATA, LARGEST MEASURING 4.5 CM.  - UNREMARKABLE FALLOPIAN TUBES AND OVARIES.   B. SENTINEL LYMPH NODE, LEFT OBTURATOR; EXCISION:  - METASTATIC CARCINOMA INVOLVES ONE LYMPH NODE (1/1).   C. SENTINEL LYMPH NODE, LEFT EXTERNAL ILIAC; EXCISION:  - LYMPHOID TISSUE NOT PRESENT.  - NEGATIVE FOR MALIGNANCY.   D. SENTINEL LYMPH NODE, RIGHT EXTERNAL ILIAC; EXCISION:  - METASTATIC CARCINOMA INVOLVES ONE OF TWO LYMPH NODES (1/2).   LVSI present and atypical washings.  Invasion 6/18 and cervix and adnexa negative.   MLH1: LOSS of protein expression  MSH2: Intact nuclear expression  MSH6: Intact nuclear expression  PMS2: LOSS of protein expression   MLH1- positive methylation HER-2 negative  Pathologic Stage: FIGO stage IIIC1 grade2   She has seen Dr. Hanley Hays oncology and initiated carboplatin-taxol with neulasta support on 11/05/18. She has met with Dr. Baruch Gouty for evaluation with plan for pelvic radiation and periaortic nodes with boost to vaginal cuff scheduled to start after 3rd  cycle of chemotherapy.   Gynecologic Oncology History:  She has history of Grade 3 uterine prolapse with Grade 1 cystocele and rectocele. She noted pessary had become dislodged after coughing d/t pneumonia and presented to Dr. Marcelline Mates for evaluation 05/2018. At that time she noted post-menopausal bleeding and cramping and has history of cervical polyps. Bleeding was felt to be possibly related to pessary.   Ultrasound revealed: uterus measuring 10.2 x 5.5 x 5.7 cm, heterogenous with evidence of fibroids invading endometrium measuring 2.9 x 3.3 x 3.6 cm and two additional fibroids measuring 2.0 x 2.1 x 1.8, and 2.9 x 3.3 x 3.6. Endometrium measuring 4.7 mm. Neither ovary was visualized.   She had second episode of bleeding 08/31/18-09/04/18, felt to be heavier and more 'period like'. Endometrial biopsy was performed. Pathology: - Endometrioid adenocarcinoma, figo grade 1.    Problem List: Patient Active Problem List   Diagnosis Date Noted  . Endometrial adenocarcinoma ([place]) 10/29/2018  . Umbilical hernia without obstruction and without gangrene   . Atherosclerosis of right coronary artery 11/10/2017  . Atherosclerosis of abdominal aorta ([place]) 11/10/2017  . Prediabetes 10/02/2017  . Pain in limb 03/31/2017  . Perennial allergic rhinitis with seasonal variation 05/14/2016  . Asthma, mild intermittent, well-controlled 05/14/2016  . Hypertension goal BP (blood pressure) < 140/90 12/10/2015  . Cardiomyopathy due to hypertension ([place]) 12/10/2015  . History of CVA (cerebrovascular accident) 12/10/2015  . Hyperlipidemia LDL goal <70 12/10/2015  . Statin intolerance 12/10/2015    Past Medical History: Past Medical History:  Diagnosis Date  . Anxiety   . Asthma    history of asthma  . Cardiomyopathy ([place])   . Complication of anesthesia  tore hair out and made teeth rough  . Depression   . Endometrial cancer ([place]) 08/2018  . GERD (gastroesophageal reflux disease)    takes prilosec prn  .  History of CVA (cerebrovascular accident) 12/10/2015  . Hyperlipidemia LDL goal <70 12/10/2015  . Hypertension   . Prediabetes 10/02/2017   A1c 6 in January 2018  . Stroke [facility]) 1990    no residual effects    Past Surgical History: Past Surgical History:  Procedure Laterality Date  . ABDOMINAL HYSTERECTOMY    . CHOLECYSTECTOMY    . COLONOSCOPY WITH PROPOFOL N/A 01/08/2017   Procedure: COLONOSCOPY WITH PROPOFOL;  Surgeon: [name], MD;  Location: [facility] ENDOSCOPY;  Service: Endoscopy;  Laterality: N/A;  . COLONOSCOPY WITH PROPOFOL N/A 05/05/2018   Procedure: COLONOSCOPY WITH PROPOFOL;  Surgeon: [name], MD;  Location[facility] ENDOSCOPY;  Service: Gastroenterology;  Laterality: N/A;  . HERNIA REPAIR  [id number]   umbilical  . PORTACATH PLACEMENT N/A 11/04/2018   Procedure: INSERTION PORT-A-CATH;  Surgeon: Jules Husbands, MD;  Location: [facility] ORS;  Service: General;  Laterality: N/A;  . SENTINEL NODE BIOPSY N/A 10/06/2018   Procedure: SENTINEL NODE BIOPSY;  Surgeon: Mellody Drown, MD;  Location: [facility] ORS;  Service: Gynecology;  Laterality: N/A;  . UMBILICAL HERNIA REPAIR N/A 11/17/2017   Procedure: HERNIA REPAIR UMBILICAL ADULT;  Surgeon: Jules Husbands, MD;  Location: [facility] ORS;  Service: General;  Laterality: N/A;    Past Gynecologic History:  [id number] Denies abnormal pap smears.  History of OCP use.  Denies STD history  OB History:  OB History  Gravida Para Term Preterm AB Living  _0 SAB TAB Ectopic Multiple Live Births          2    # Outcome Date GA Lbr Len/2nd Weight Sex Delivery Anes PTL Lv  2 Term 37    F Vag-Spont   LIV  1 Term 82    F Vag-Spont   LIV    Family History: Family History  Problem Relation Age of Onset  . Stroke Mother   . Hypertension Mother   . Dementia Mother   . Alzheimer's disease Mother   . Gout Father   . Asthma Father   . Hypertension Father   . Dementia Father   . Healthy Sister   . Stroke Brother   . Alzheimer's disease  Brother   . Healthy Daughter   . Hypertension Brother   . Healthy Brother   . Cancer Paternal Grandmother   . Healthy Sister   . Healthy Sister   . Hypertension Sister   . Hypertension Sister   . Stroke Brother   . Alzheimer's disease Brother   . Stroke Brother   . Hypertension Brother   . Stroke Brother   . Hypertension Brother   . Healthy Brother   . Healthy Brother   . Hypertension Daughter   . Breast cancer Neg Hx    Social History: Social History   Socioeconomic History  . Marital status: Divorced    Spouse name: Not on file  . Number of children: 2  . Years of education: some college  . Highest education level: 12th grade  Occupational History  . 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 Occupation: currently a Careers adviser  . Financial resource strain: Not hard at all  . Food insecurity:    Worry: Never true  Inability: Never true  . Transportation needs:    Medical: No    Non-medical: No  Tobacco Use  . Smoking status: Never Smoker  . Smokeless tobacco: Never Used  . Tobacco comment: smoking cessation materials not required  Substance and Sexual Activity  . Alcohol use: No    Alcohol/week: 0.0 standard drinks  . Drug use: No  . Sexual activity: Not Currently  Lifestyle  . Physical activity:    Days per week: 0 days    Minutes per session: 0 min  . Stress: Not at all  Relationships  . Social connections:    Talks on phone: More than three times a week    Gets together: Three times a week    Attends religious service: More than 4 times per year    Active member of club or organization: Yes    Attends meetings of clubs or organizations: More than 4 times per year    Relationship status: Divorced  . Intimate partner violence:    Fear of current or ex partner: No    Emotionally abused: No    Physically abused: No    Forced sexual activity: No  Other Topics Concern  . Not on file  Social History Narrative  . Not on  file    Allergies: Allergies  Allergen Reactions  . Nsaids Hives and Rash  . Statins Other (See Comments)    Joint pains  . Victoza [Liraglutide] Other (See Comments)    pancreatitis  . Oxycodone Nausea And Vomiting  . Crestor [Rosuvastatin Calcium] Rash    Current Medications: Current Outpatient Medications  Medication Sig Dispense Refill  . escitalopram (LEXAPRO) 5 MG tablet Take 1 tablet (5 mg total) by mouth daily. 90 tablet 0  . ezetimibe (ZETIA) 10 MG tablet Take 1 tablet (10 mg total) by mouth daily. 90 tablet 1  . furosemide (LASIX) 40 MG tablet Take 1 tablet (40 mg total) by mouth daily. (Patient taking differently: Take 40 mg by mouth daily as needed for fluid. ) 90 tablet 1  . loratadine (CLARITIN) 10 MG tablet Take 1 tablet (10 mg total) by mouth daily. 100 tablet 0  . olmesartan-hydrochlorothiazide (BENICAR HCT) 20-12.5 MG tablet Take 1 tablet by mouth daily. 90 tablet 1  . omeprazole (PRILOSEC OTC) 20 MG tablet Take 20 mg by mouth daily as needed.    . traMADol (ULTRAM) 50 MG tablet Take 1 tablet (50 mg total) by mouth every 6 (six) hours as needed for moderate pain. 20 tablet 0  . acetaminophen (TYLENOL) 500 MG tablet Take 2 tablets (1,000 mg total) by mouth every 6 (six) hours as needed for mild pain or moderate pain. (Patient not taking: Reported on 11/05/2018) 30 tablet 0  . albuterol (PROVENTIL HFA;VENTOLIN HFA) 108 (90 Base) MCG/ACT inhaler Inhale 2 puffs into the lungs every 4 (four) hours as needed for wheezing or shortness of breath. (Patient not taking: Reported on 11/05/2018) 1 Inhaler 0  . dexamethasone (DECADRON) 4 MG tablet Take 2 tablets (8 mg total) by mouth daily. Start the day after chemotherapy for 2 days. (Patient not taking: Reported on 11/05/2018) 30 tablet 1  . diclofenac sodium (VOLTAREN) 1 % GEL Apply 2 g topically 3 (three) times daily as needed (pain). (Patient not taking: Reported on 11/05/2018) 100 g 1  . docusate sodium (COLACE) 100 MG capsule Take  1 capsule (100 mg total) by mouth 2 (two) times daily as needed for mild constipation. (Patient not taking: Reported on 11/05/2018)  30 capsule 2  . ketoconazole (NIZORAL) 2 % cream Apply 1 application topically daily as needed for irritation. On abdominal fold prn (Patient not taking: Reported on 11/02/2018) 60 g 0  . lidocaine-prilocaine (EMLA) cream Apply to affected area once (Patient not taking: Reported on 11/05/2018) 30 g 3  . LORazepam (ATIVAN) 0.5 MG tablet Take 1 tablet (0.5 mg total) by mouth every 6 (six) hours as needed (Nausea or vomiting). (Patient not taking: Reported on 11/05/2018) 30 tablet 0  . metFORMIN (GLUCOPHAGE-XR) 750 MG 24 hr tablet Take 1 tablet (750 mg total) by mouth daily with breakfast. (Patient not taking: Reported on 11/02/2018) 90 tablet 1  . ondansetron (ZOFRAN) 8 MG tablet Take 1 tablet (8 mg total) by mouth 2 (two) times daily as needed for refractory nausea / vomiting. Start on day 3 after chemo. (Patient not taking: Reported on 11/05/2018) 30 tablet 1  . prochlorperazine (COMPAZINE) 10 MG tablet Take 1 tablet (10 mg total) by mouth every 6 (six) hours as needed (Nausea or vomiting). (Patient not taking: Reported on 11/05/2018) 30 tablet 1   No current facility-administered medications for this visit.    Review of Systems General:  no complaints Skin: no complaints Eyes: no complaints HEENT: no complaints Breasts: no complaints Pulmonary: no complaints Cardiac: no complaints Gastrointestinal: no complaints Genitourinary/Sexual: no complaints Ob/Gyn: no complaints Musculoskeletal: no complaints Hematology: no complaints Neurologic/Psych: no complaints   Objective:  Physical Examination:  BP 131/83 (BP Location: Left Arm, Patient Position: Sitting)   Pulse 73   Temp 97.8 F (36.6 C) (Tympanic)   Resp 18   Ht _0  (1.499 m)   Wt 213 lb (96.6 kg)   SpO2 95%   BMI 43.02 kg/m     ECOG Performance Status: 0 - Asymptomatic  GENERAL: Patient is a well  appearing female in no acute distress HEENT:  PERRL, neck supple with midline trachea. Thyroid without masses.  NODES:  No cervical, supraclavicular, axillary, or inguinal lymphadenopathy palpated.  LUNGS:  Clear to auscultation bilaterally.  No wheezes or rhonchi. HEART:  Regular rate and rhythm. No murmur appreciated. ABDOMEN:  Soft, nontender.  Positive, normoactive bowel sounds.  Incisions healed well. MSK:  No focal spinal tenderness to palpation. Full range of motion bilaterally in the upper extremities. EXTREMITIES:  No peripheral edema.   SKIN:  Clear with no obvious rashes or skin changes. No nail dyscrasia. NEURO:  Nonfocal. Well oriented.  Appropriate affect.  Pelvic: EGBUS: no lesions Vagina: cuff well healed.  Bimanual: no masses  Assessment:  Molly [name] is a 67 y.o. female diagnosed with stage IIIC1 grade 2 endometrial cancer with bilateral positive pelvic SLNs with macroscopic tumor s/p TLH/BSO pelvic SLN biopsies 10/06/18.  Normal post op exam.  Tumor has MLH1 loss with promoter methylation, so she would be a candidate for immune checkpoint inhibitor therapy if she has a recurrence in the future. HER-2 negative.   Medical co-morbidities complicating care: Morbid obesity, CVA 1990 with no residual and no medication, HTN with good BP today.    Plan:   Problem List Items Addressed This Visit      Genitourinary   Endometrial adenocarcinoma ([place]) - Primary     We discussed options for management including adjuvant chemotherapy with carbo/taxol and radiation in the middle as prescribed by Dr. Baruch Gouty (external pelvic with low PA field + vaginal brachy.  She has already had cycle 1 of chemotherapy last week with Dr Janese Banks.  We will see her back  in 6 months when she has completed adjuvant therapy.    Mellody Drown, MD  CC:  Steele Sizer, [facility] [address] [place], [place] [address] [phone]

## 2018-11-10 NOTE — Progress Notes (Signed)
No GYN changes noted today

## 2018-11-14 ENCOUNTER — Other Ambulatory Visit: Payer: Self-pay | Admitting: *Deleted

## 2018-11-14 DIAGNOSIS — C541 Malignant neoplasm of endometrium: Secondary | ICD-10-CM

## 2018-11-15 ENCOUNTER — Inpatient Hospital Stay (HOSPITAL_BASED_OUTPATIENT_CLINIC_OR_DEPARTMENT_OTHER): Payer: Medicare Other | Admitting: Oncology

## 2018-11-15 ENCOUNTER — Other Ambulatory Visit: Payer: Medicare Other

## 2018-11-15 ENCOUNTER — Inpatient Hospital Stay: Payer: Medicare Other

## 2018-11-15 ENCOUNTER — Encounter: Payer: Self-pay | Admitting: Oncology

## 2018-11-15 VITALS — BP 138/81 | HR 67 | Temp 97.2°F

## 2018-11-15 VITALS — BP 161/62 | HR 64 | Temp 97.1°F | Resp 18 | Ht 59.0 in | Wt 209.3 lb

## 2018-11-15 DIAGNOSIS — D509 Iron deficiency anemia, unspecified: Secondary | ICD-10-CM

## 2018-11-15 DIAGNOSIS — C541 Malignant neoplasm of endometrium: Secondary | ICD-10-CM

## 2018-11-15 DIAGNOSIS — Z5111 Encounter for antineoplastic chemotherapy: Secondary | ICD-10-CM | POA: Diagnosis not present

## 2018-11-15 DIAGNOSIS — D72829 Elevated white blood cell count, unspecified: Secondary | ICD-10-CM

## 2018-11-15 DIAGNOSIS — D729 Disorder of white blood cells, unspecified: Secondary | ICD-10-CM

## 2018-11-15 DIAGNOSIS — D649 Anemia, unspecified: Secondary | ICD-10-CM | POA: Diagnosis not present

## 2018-11-15 LAB — COMPREHENSIVE METABOLIC PANEL
ALK PHOS: 165 U/L — AB (ref 38–126)
ALT: 41 U/L (ref 0–44)
AST: 37 U/L (ref 15–41)
Albumin: 4 g/dL (ref 3.5–5.0)
Anion gap: 10 (ref 5–15)
BUN: 12 mg/dL (ref 8–23)
CALCIUM: 8.6 mg/dL — AB (ref 8.9–10.3)
CO2: 27 mmol/L (ref 22–32)
CREATININE: 0.74 mg/dL (ref 0.44–1.00)
Chloride: 102 mmol/L (ref 98–111)
GFR calc Af Amer: >60 mL/min (ref >60)
GFR calc non Af Amer: >60 mL/min (ref >60)
Glucose, Bld: 108 mg/dL — ABNORMAL HIGH (ref 70–99)
Potassium: 3.6 mmol/L (ref 3.5–5.1)
SODIUM: 139 mmol/L (ref 135–145)
Total Bilirubin: 0.3 mg/dL (ref 0.3–1.2)
Total Protein: 7 g/dL (ref 6.5–8.1)

## 2018-11-15 LAB — CBC WITH DIFFERENTIAL/PLATELET
ABS IMMATURE GRANULOCYTES: 7.75 10*3/uL — AB (ref 0.00–0.07)
BASOS ABS: 0 10*3/uL (ref 0.0–0.1)
Basophils Relative: 0 %
Eosinophils Absolute: 0.1 10*3/uL (ref 0.0–0.5)
Eosinophils Relative: 0 %
HCT: 33.8 % — ABNORMAL LOW (ref 36.0–46.0)
HEMOGLOBIN: 10.9 g/dL — AB (ref 12.0–15.0)
Immature Granulocytes: 14 %
LYMPHS ABS: 5.2 10*3/uL — AB (ref 0.7–4.0)
LYMPHS PCT: 9 %
MCH: 29.5 pg (ref 26.0–34.0)
MCHC: 32.2 g/dL (ref 30.0–36.0)
MCV: 91.4 fL (ref 80.0–100.0)
MONO ABS: 2.9 10*3/uL — AB (ref 0.1–1.0)
MONOS PCT: 5 %
Neutro Abs: 40 10*3/uL — ABNORMAL HIGH (ref 1.7–7.7)
Neutrophils Relative %: 72 %
Platelets: 225 10*3/uL (ref 150–400)
RBC: 3.7 MIL/uL — ABNORMAL LOW (ref 3.87–5.11)
RDW: 14 % (ref 11.5–15.5)
WBC: 56 10*3/uL (ref 4.0–10.5)
nRBC: 0.2 % (ref 0.0–0.2)

## 2018-11-15 MED ORDER — HEPARIN SOD (PORK) LOCK FLUSH 100 UNIT/ML IV SOLN
500.0000 [IU] | Freq: Once | INTRAVENOUS | Status: AC
  Administered 2018-11-15: 500 [IU] via INTRAVENOUS

## 2018-11-15 MED ORDER — SODIUM CHLORIDE 0.9% FLUSH
10.0000 mL | INTRAVENOUS | Status: DC | PRN
  Administered 2018-11-15: 10 mL via INTRAVENOUS
  Filled 2018-11-15: qty 10

## 2018-11-15 MED ORDER — SODIUM CHLORIDE 0.9 % IV SOLN
510.0000 mg | Freq: Once | INTRAVENOUS | Status: AC
  Administered 2018-11-15: 510 mg via INTRAVENOUS
  Filled 2018-11-15: qty 17

## 2018-11-15 MED ORDER — SODIUM CHLORIDE 0.9 % IV SOLN
Freq: Once | INTRAVENOUS | Status: AC
  Administered 2018-11-15: 11:00:00 via INTRAVENOUS
  Filled 2018-11-15: qty 250

## 2018-11-15 NOTE — Progress Notes (Unsigned)
Patient receiving 1st feraheme treatment today. Initiated drug; infused for 2 minutes then slowed rate to 77ml/hr for a few minutes to make sure patient tolerated medication. Restarted Feraheme at 362ml/hr until completed.

## 2018-11-15 NOTE — Progress Notes (Signed)
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 Marcelline Mates for cystocele and rectocele. She was noted to have postmenopausal bleeding and cramping recently. Ultrasound revealed heterogeneous appearance of uterus with fibroids invading the endometrium. She underwent an endometrial biopsy when she had a second bout of postmenopausal bleeding. Biopsy showed endometrioid adenocarcinoma FIGO grade 1. She was then seen by Dr. Fransisca Connors and plan was to proceed with laparoscopic hysterectomy and bilateral salpingo-oophorectomy along with pelvic lymph node sampling  Final pathology showed: Endometrioid carcinoma FIGO grade 2 with 33% myometrial invasion. Lymphovascular invasion present. Peritoneal/ascites fluid atypical. 2 out of 3 sentinel lymph nodes were positive for macro metastases. pT1a pN1a. FIGO IIIC1  Patient's case was discussed at tumor board and consensus was to proceed with 3 cycles of adjuvant carbotaxol followed by radiation followed by 3 more cycles of chemotherapy. Patient is already met with Dr. Donella Stade who will be giving radiation therapy to pelvic and periaortic lymph nodes and boost to her vaginal cuff. PET CT scan showed metastatic adenopathy and retroperitoneal and external iliac group of lymph nodes in the right side.  No evidence of metastatic disease  elsewhere.  First cycle of chemotherapy was given on 11/05/2018   Interval history-patient has tolerated cycle 1 of chemotherapy relatively well.  She had mild intermittent nausea which was well controlled with nausea medications.  She also had Neulasta associated bone pain which lasted for a few days before it resolved.  Her weight is down by 6 pounds today but overall her appetite is good.  She denies any tingling numbness in her extremities  ECOG PS- 1 Pain scale- 0 Opioid associated constipation- no  Review of systems- Review of Systems  Constitutional: Positive for malaise/fatigue. Negative for chills, fever and weight loss.  HENT: Negative for congestion, ear discharge and nosebleeds.   Eyes: Negative for blurred vision.  Respiratory: Negative for cough, hemoptysis, sputum production, shortness of breath and wheezing.   Cardiovascular: Negative for chest pain, palpitations, orthopnea and claudication.  Gastrointestinal: Positive for nausea. Negative for abdominal pain, blood in stool, constipation, diarrhea, heartburn, melena and vomiting.  Genitourinary: Negative for dysuria, flank pain, frequency, hematuria and urgency.  Musculoskeletal: Negative for back pain, joint pain and myalgias.  Skin: Negative for rash.  Neurological: Negative for dizziness, tingling, focal weakness, seizures, weakness and headaches.  Endo/Heme/Allergies: Does not bruise/bleed easily.  Psychiatric/Behavioral: Negative for depression and suicidal ideas. The patient does not have insomnia.        Allergies  Allergen Reactions  . Nsaids Hives and Rash  . Statins Other (See Comments)    Joint pains  . Victoza [Liraglutide] Other (See Comments)    pancreatitis  . Oxycodone Nausea And Vomiting  . Crestor [Rosuvastatin Calcium] Rash     Past Medical History:  Diagnosis Date  . Anxiety   . Asthma    history of asthma  . Cardiomyopathy ([place])   . Complication of anesthesia    tore hair out  and made  teeth rough  . Depression   . Endometrial cancer ([place]) 08/2018  . GERD (gastroesophageal reflux disease)    takes prilosec prn  . History of CVA (cerebrovascular accident) 12/10/2015  . Hyperlipidemia LDL goal <70 12/10/2015  . Hypertension   . Prediabetes 10/02/2017   A1c 6 in January 2018  . Stroke [facility]) 1990    no residual effects     Past Surgical History:  Procedure Laterality Date  . ABDOMINAL HYSTERECTOMY    . CHOLECYSTECTOMY    . COLONOSCOPY WITH PROPOFOL N/A 01/08/2017   Procedure: COLONOSCOPY WITH PROPOFOL;  Surgeon: [name], MD;  Location: [facility] ENDOSCOPY;  Service: Endoscopy;  Laterality: N/A;  . COLONOSCOPY WITH PROPOFOL N/A 05/05/2018   Procedure: COLONOSCOPY WITH PROPOFOL;  Surgeon: [name], MD;  Location: [facility] ENDOSCOPY;  Service: Gastroenterology;  Laterality: N/A;  . HERNIA REPAIR  [id number]   umbilical  . PORTACATH PLACEMENT N/A 11/04/2018   Procedure: INSERTION PORT-A-CATH;  Surgeon: Jules Husbands, MD;  Location: [facility] ORS;  Service: General;  Laterality: N/A;  . SENTINEL NODE BIOPSY N/A 10/06/2018   Procedure: SENTINEL NODE BIOPSY;  Surgeon: Mellody Drown, MD;  Location: [facility] ORS;  Service: Gynecology;  Laterality: N/A;  . UMBILICAL HERNIA REPAIR N/A 11/17/2017   Procedure: HERNIA REPAIR UMBILICAL ADULT;  Surgeon: Jules Husbands, MD;  Location: [facility] ORS;  Service: General;  Laterality: N/A;    Social History   Socioeconomic History  . Marital status: Divorced    Spouse name: Not on file  . Number of children: 2  . Years of education: some college  . Highest education level: 12th grade  Occupational History  . 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 Occupation: currently a Careers adviser  . Financial resource strain: Not hard at all  . Food insecurity:    Worry: Never true    Inability: Never true  . Transportation needs:    Medical: No    Non-medical: No  Tobacco Use  . Smoking status: Never Smoker  . Smokeless  tobacco: Never Used  . Tobacco comment: smoking cessation materials not required  Substance and Sexual Activity  . Alcohol use: No    Alcohol/week: 0.0 standard drinks  . Drug use: No  . Sexual activity: Not Currently  Lifestyle  . Physical activity:    Days per week: 0 days    Minutes per session: 0 min  . Stress: Not at all  Relationships  . Social connections:    Talks on phone: More than three times a week    Gets together: Three times a week    Attends religious service: More than 4 times per year    Active member of club or organization: Yes    Attends meetings of clubs or organizations: More than 4 times per year    Relationship status: Divorced  . Intimate partner violence:    Fear of current or ex partner: No    Emotionally abused: No    Physically abused: No    Forced sexual activity: No  Other Topics Concern  . Not on file  Social History Narrative  . Not on file    Family History  Problem Relation Age of Onset  . Stroke Mother   . Hypertension Mother   . Dementia Mother   . Alzheimer's disease Mother   . Gout Father   . Asthma Father   . Hypertension Father   .  Dementia Father   . Healthy Sister   . Stroke Brother   . Alzheimer's disease Brother   . Healthy Daughter   . Hypertension Brother   . Healthy Brother   . Cancer Paternal Grandmother   . Healthy Sister   . Healthy Sister   . Hypertension Sister   . Hypertension Sister   . Stroke Brother   . Alzheimer's disease Brother   . Stroke Brother   . Hypertension Brother   . Stroke Brother   . Hypertension Brother   . Healthy Brother   . Healthy Brother   . Hypertension Daughter   . Breast cancer Neg Hx      Current Outpatient Medications:  .  escitalopram (LEXAPRO) 5 MG tablet, Take 1 tablet (5 mg total) by mouth daily., Disp: 90 tablet, Rfl: 0 .  ezetimibe (ZETIA) 10 MG tablet, Take 1 tablet (10 mg total) by mouth daily., Disp: 90 tablet, Rfl: 1 .  furosemide (LASIX) 40 MG tablet, Take 1  tablet (40 mg total) by mouth daily. (Patient taking differently: Take 40 mg by mouth daily as needed for fluid. ), Disp: 90 tablet, Rfl: 1 .  loratadine (CLARITIN) 10 MG tablet, Take 1 tablet (10 mg total) by mouth daily., Disp: 100 tablet, Rfl: 0 .  olmesartan-hydrochlorothiazide (BENICAR HCT) 20-12.5 MG tablet, Take 1 tablet by mouth daily., Disp: 90 tablet, Rfl: 1 .  omeprazole (PRILOSEC OTC) 20 MG tablet, Take 20 mg by mouth daily as needed., Disp: , Rfl:  .  acetaminophen (TYLENOL) 500 MG tablet, Take 2 tablets (1,000 mg total) by mouth every 6 (six) hours as needed for mild pain or moderate pain. (Patient not taking: Reported on 11/05/2018), Disp: 30 tablet, Rfl: 0 .  albuterol (PROVENTIL HFA;VENTOLIN HFA) 108 (90 Base) MCG/ACT inhaler, Inhale 2 puffs into the lungs every 4 (four) hours as needed for wheezing or shortness of breath. (Patient not taking: Reported on 11/05/2018), Disp: 1 Inhaler, Rfl: 0 .  dexamethasone (DECADRON) 4 MG tablet, Take 2 tablets (8 mg total) by mouth daily. Start the day after chemotherapy for 2 days. (Patient not taking: Reported on 11/05/2018), Disp: 30 tablet, Rfl: 1 .  diclofenac sodium (VOLTAREN) 1 % GEL, Apply 2 g topically 3 (three) times daily as needed (pain). (Patient not taking: Reported on 11/05/2018), Disp: 100 g, Rfl: 1 .  docusate sodium (COLACE) 100 MG capsule, Take 1 capsule (100 mg total) by mouth 2 (two) times daily as needed for mild constipation. (Patient not taking: Reported on 11/05/2018), Disp: 30 capsule, Rfl: 2 .  ketoconazole (NIZORAL) 2 % cream, Apply 1 application topically daily as needed for irritation. On abdominal fold prn (Patient not taking: Reported on 11/02/2018), Disp: 60 g, Rfl: 0 .  lidocaine-prilocaine (EMLA) cream, Apply to affected area once (Patient not taking: Reported on 11/05/2018), Disp: 30 g, Rfl: 3 .  LORazepam (ATIVAN) 0.5 MG tablet, Take 1 tablet (0.5 mg total) by mouth every 6 (six) hours as needed (Nausea or vomiting).  (Patient not taking: Reported on 11/05/2018), Disp: 30 tablet, Rfl: 0 .  metFORMIN (GLUCOPHAGE-XR) 750 MG 24 hr tablet, Take 1 tablet (750 mg total) by mouth daily with breakfast. (Patient not taking: Reported on 11/02/2018), Disp: 90 tablet, Rfl: 1 .  ondansetron (ZOFRAN) 8 MG tablet, Take 1 tablet (8 mg total) by mouth 2 (two) times daily as needed for refractory nausea / vomiting. Start on day 3 after chemo. (Patient not taking: Reported on 11/05/2018), Disp: 30 tablet, Rfl: 1 .  prochlorperazine (COMPAZINE) 10 MG tablet, Take 1 tablet (10 mg total) by mouth every 6 (six) hours as needed (Nausea or vomiting). (Patient not taking: Reported on 11/05/2018), Disp: 30 tablet, Rfl: 1 .  traMADol (ULTRAM) 50 MG tablet, Take 1 tablet (50 mg total) by mouth every 6 (six) hours as needed for moderate pain. (Patient not taking: Reported on 11/15/2018), Disp: 20 tablet, Rfl: 0 No current facility-administered medications for this visit.   Facility-Administered Medications Ordered in Other Visits:  .  sodium chloride flush (NS) 0.9 % injection 10 mL, 10 mL, Intravenous, PRN, Sindy Guadeloupe, MD, 10 mL at 11/15/18 0915  Physical exam:  Vitals:   11/15/18 1019  BP: (!) 161/62  Pulse: 64  Resp: 18  Temp: (!) 97.1 F (36.2 C)  TempSrc: Tympanic  SpO2: 98%  Weight: 209 lb 5.2 oz (95 kg)  Height: 4' 11"  (1.499 m)   Physical Exam  Constitutional: She is oriented to person, place, and time.  Patient is obese.  Does not appear to be in any acute distress  HENT:  Head: Normocephalic and atraumatic.  Eyes: Pupils are equal, round, and reactive to light. EOM are normal.  Neck: Normal range of motion.  Cardiovascular: Normal rate, regular rhythm and normal heart sounds.  Pulmonary/Chest: Effort normal and breath sounds normal.  Abdominal: Soft. Bowel sounds are normal.  Recent surgical scar has healed well  Neurological: She is alert and oriented to person, place, and time.  Skin: Skin is warm and dry.      CMP Latest Ref Rng & Units 11/15/2018  Glucose 70 - 99 mg/dL 108(H)  BUN 8 - 23 mg/dL 12  Creatinine 0.44 - 1.00 mg/dL 0.74  Sodium 135 - 145 mmol/L 139  Potassium 3.5 - 5.1 mmol/L 3.6  Chloride 98 - 111 mmol/L 102  CO2 22 - 32 mmol/L 27  Calcium 8.9 - 10.3 mg/dL 8.6(L)  Total Protein 6.5 - 8.1 g/dL 7.0  Total Bilirubin 0.3 - 1.2 mg/dL 0.3  Alkaline Phos 38 - 126 U/L 165(H)  AST 15 - 41 U/L 37  ALT 0 - 44 U/L 41   CBC Latest Ref Rng & Units 11/15/2018  WBC 4.0 - 10.5 K/uL 56.0(HH)  Hemoglobin 12.0 - 15.0 g/dL 10.9(L)  Hematocrit 36.0 - 46.0 % 33.8(L)  Platelets 150 - 400 K/uL 225    No images are attached to the encounter.  Ct Chest W Contrast  Result Date: 10/25/2018 CLINICAL DATA:  Endometrial adenocarcinoma status post [facility] 10/06/2018. Positive metastatic nodes in the right external iliac and left obturator chains. Staging evaluation. EXAM: CT CHEST, ABDOMEN, AND PELVIS WITH CONTRAST TECHNIQUE: Multidetector CT imaging of the chest, abdomen and pelvis was performed following the standard protocol during bolus administration of intravenous contrast. CONTRAST:  125m ISOVUE-300 IOPAMIDOL (ISOVUE-300) INJECTION 61% COMPARISON:  04/07/2018 chest radiograph. 10/29/2017 CT abdomen/pelvis. FINDINGS: CT CHEST FINDINGS Cardiovascular: Top-normal heart size. No significant pericardial effusion/thickening. Left anterior descending coronary atherosclerosis. Atherosclerotic nonaneurysmal thoracic aorta. Normal caliber pulmonary arteries. No central pulmonary emboli. Mediastinum/Nodes: No discrete thyroid nodules. Unremarkable esophagus. No pathologically enlarged axillary, mediastinal or hilar lymph nodes. Lungs/Pleura: No pneumothorax. No pleural effusion. No acute consolidative airspace disease or lung masses. There are 2 scattered tiny pulmonary nodules, largest 2 mm in the left upper lobe (series 3/image 77). Musculoskeletal: No aggressive appearing focal osseous lesions. Moderate thoracic  spondylosis. CT ABDOMEN PELVIS FINDINGS Hepatobiliary: Normal liver with no liver mass. Cholecystectomy. No biliary ductal dilatation. Pancreas: Normal, with no mass or  duct dilation. Spleen: Normal size. No mass. Adrenals/Urinary Tract: Normal adrenals. Mild scarring in the anterior lower left kidney. No renal masses. No hydronephrosis. Normal bladder. Stomach/Bowel: Normal non-distended stomach. Normal caliber small bowel with no small bowel wall thickening. Appendix not discretely visualized. No pericecal inflammatory changes. Normal large bowel with no diverticulosis, large bowel wall thickening or pericolonic fat stranding. Vascular/Lymphatic: Atherosclerotic nonaneurysmal abdominal aorta. Patent portal, splenic, hepatic and renal veins. Enlarged low left para aortic 1.7 cm node (series 2/image 76). Mild right external iliac adenopathy up to 1.0 cm (series 2/image 94). Enlarged 1.2 cm left external iliac node (series 2/image 91). No additional pathologically enlarged lymph nodes in the abdomen or pelvis. Reproductive: No mass or fluid collection at the vaginal cuff. Right adnexal 2.2 x 2.1 cm simple fluid collection (series 2/image 80) and left adnexal 2.9 x 2.1 cm simple fluid collection (series 2/image 84). No solid adnexal masses. Other: No pneumoperitoneum. No ascites. Minimal fat stranding in the omentum (series 2/image 78) without discrete peritoneal nodules. Musculoskeletal: No aggressive appearing focal osseous lesions. Moderate lumbar spondylosis. IMPRESSION: 1. Low left para-aortic and bilateral external iliac lymphadenopathy compatible with metastatic disease. 2. Two tiny pulmonary nodules, largest 2 mm, indeterminate, for which follow-up chest CT is advised in 3-6 months. 3. Nonspecific minimal fat stranding in the omentum without discrete peritoneal nodularity, for which attention on follow-up CT abdomen/pelvis is advised. 4. No additional potential findings of metastatic disease. 5. Small simple  fluid collections in the bilateral adnexal regions most compatible with postsurgical seromas. 6. One vessel coronary atherosclerosis. 7.  Aortic Atherosclerosis (ICD10-I70.0). Electronically Signed   By: Ilona Sorrel M.D.   On: 10/25/2018 09:40   Ct Abdomen Pelvis W Contrast  Result Date: 10/25/2018 CLINICAL DATA:  Endometrial adenocarcinoma status post [facility] 10/06/2018. Positive metastatic nodes in the right external iliac and left obturator chains. Staging evaluation. EXAM: CT CHEST, ABDOMEN, AND PELVIS WITH CONTRAST TECHNIQUE: Multidetector CT imaging of the chest, abdomen and pelvis was performed following the standard protocol during bolus administration of intravenous contrast. CONTRAST:  165m ISOVUE-300 IOPAMIDOL (ISOVUE-300) INJECTION 61% COMPARISON:  04/07/2018 chest radiograph. 10/29/2017 CT abdomen/pelvis. FINDINGS: CT CHEST FINDINGS Cardiovascular: Top-normal heart size. No significant pericardial effusion/thickening. Left anterior descending coronary atherosclerosis. Atherosclerotic nonaneurysmal thoracic aorta. Normal caliber pulmonary arteries. No central pulmonary emboli. Mediastinum/Nodes: No discrete thyroid nodules. Unremarkable esophagus. No pathologically enlarged axillary, mediastinal or hilar lymph nodes. Lungs/Pleura: No pneumothorax. No pleural effusion. No acute consolidative airspace disease or lung masses. There are 2 scattered tiny pulmonary nodules, largest 2 mm in the left upper lobe (series 3/image 77). Musculoskeletal: No aggressive appearing focal osseous lesions. Moderate thoracic spondylosis. CT ABDOMEN PELVIS FINDINGS Hepatobiliary: Normal liver with no liver mass. Cholecystectomy. No biliary ductal dilatation. Pancreas: Normal, with no mass or duct dilation. Spleen: Normal size. No mass. Adrenals/Urinary Tract: Normal adrenals. Mild scarring in the anterior lower left kidney. No renal masses. No hydronephrosis. Normal bladder. Stomach/Bowel: Normal non-distended stomach.  Normal caliber small bowel with no small bowel wall thickening. Appendix not discretely visualized. No pericecal inflammatory changes. Normal large bowel with no diverticulosis, large bowel wall thickening or pericolonic fat stranding. Vascular/Lymphatic: Atherosclerotic nonaneurysmal abdominal aorta. Patent portal, splenic, hepatic and renal veins. Enlarged low left para aortic 1.7 cm node (series 2/image 76). Mild right external iliac adenopathy up to 1.0 cm (series 2/image 94). Enlarged 1.2 cm left external iliac node (series 2/image 91). No additional pathologically enlarged lymph nodes in the abdomen or pelvis. Reproductive: No mass or fluid  collection at the vaginal cuff. Right adnexal 2.2 x 2.1 cm simple fluid collection (series 2/image 80) and left adnexal 2.9 x 2.1 cm simple fluid collection (series 2/image 84). No solid adnexal masses. Other: No pneumoperitoneum. No ascites. Minimal fat stranding in the omentum (series 2/image 78) without discrete peritoneal nodules. Musculoskeletal: No aggressive appearing focal osseous lesions. Moderate lumbar spondylosis. IMPRESSION: 1. Low left para-aortic and bilateral external iliac lymphadenopathy compatible with metastatic disease. 2. Two tiny pulmonary nodules, largest 2 mm, indeterminate, for which follow-up chest CT is advised in 3-6 months. 3. Nonspecific minimal fat stranding in the omentum without discrete peritoneal nodularity, for which attention on follow-up CT abdomen/pelvis is advised. 4. No additional potential findings of metastatic disease. 5. Small simple fluid collections in the bilateral adnexal regions most compatible with postsurgical seromas. 6. One vessel coronary atherosclerosis. 7.  Aortic Atherosclerosis (ICD10-I70.0). Electronically Signed   By: Ilona Sorrel M.D.   On: 10/25/2018 09:40   Nm Pet Image Initial (pi) Skull Base To Thigh  Result Date: 10/29/2018 CLINICAL DATA:  Subsequent treatment strategy for endometrial carcinoma. EXAM:  NUCLEAR MEDICINE PET SKULL BASE TO THIGH TECHNIQUE: 10.7 mCi F-18 FDG was injected intravenously. Full-ring PET imaging was performed from the skull base to thigh after the radiotracer. CT data was obtained and used for attenuation correction and anatomic localization. Fasting blood glucose: 94 mg/dl COMPARISON:  CT 10/22/2018 FINDINGS: Mediastinal blood pool activity: SUV max 2.1 NECK: No hypermetabolic lymph nodes in the neck. Incidental CT findings: None CHEST: No hypermetabolic mediastinal or hilar nodes. No suspicious pulmonary nodules on the CT scan. Incidental CT findings: none ABDOMEN/PELVIS: LEFT periaortic retroperitoneal enlarged lymph node measuring 19 mm (image 162/4) and has intense metabolic activity with SUV max equal 13.5. This is the highest (cephalad) metastatic lymph node in relation to the pelvis. Cystic lesion at the same level on the RIGHT adjacent to the IVC has metabolic activity along the superior margin with SUV max equal 9.3. This lesion measures 2.4 by 1.9 cm predominantly cystic. Hypermetabolic RIGHT external iliac lymph node on the RIGHT measures 10 mm (image 198/4 )with SUV max equal 7.1. No clear additional hypermetabolic pelvic nodes. No abnormal activity in liver. Activity along the ventral abdominal wall related recent laparoscopic surgery. Incidental CT findings: Post hysterectomy SKELETON: No focal hypermetabolic activity to suggest skeletal metastasis. Incidental CT findings: none IMPRESSION: 1. Metastatic adenopathy in the periaortic retroperitoneum and RIGHT external iliac chain. 2. No evidence of liver metastasis. 3. No evidence of metastatic disease outside of the abdomen pelvis. Electronically Signed   By: Suzy Bouchard M.D.   On: 10/29/2018 16:04   Dg Chest Port 1 View  Result Date: 11/04/2018 CLINICAL DATA:  Port-A-Cath placement, history of endometrial adenocarcinoma EXAM: PORTABLE CHEST 1 VIEW COMPARISON:  CT chest of 10/22/2018 and chest x-ray of 04/07/2018  FINDINGS: A right-sided Port-A-Cath is now present with the tip seen to the expected SVC-RA junction. No pneumothorax is seen. Mild left basilar atelectasis is present. Mediastinal and hilar contours are unremarkable. Cardiomegaly is stable. No acute bony abnormality is seen. IMPRESSION: 1. Right-sided Port-A-Cath tip is seen to the expected SVC-RA junction. No pneumothorax. 2. Stable cardiomegaly. Electronically Signed   By: Ivar Drape M.D.   On: 11/04/2018 13:31   Dg C-arm 1-60 Min-no Report  Result Date: 11/04/2018 Fluoroscopy was utilized by the requesting physician.  No radiographic interpretation.     Assessment and plan- Patient is a 67 y.o. female with invasive adenocarcinoma of the endometrium endometrioid  subtype FIGO Stage IIIC2 pT1a N2 M0 here for toxicity check after cycle 1 of carbotaxol chemotherapy  Patient tolerated cycle 1 of chemotherapy well except for some mild intermittent nausea as well as Neulasta associated bone pain which has resolved.  She has lost 6 pounds since the last visit which we will continue to monitor.  She does not require any IV fluids today.  Patient's white count is significantly elevated at 56 secondary to Neulasta.  No signs and symptoms of infection.  Depending on her CBC at the time of her next chemo which would be on 11/29/2018 we will decide if she would need Neulasta with that cycle or not.  Patient also has a baseline normocytic anemia with a hemoglobin of 10.  B12 and folate.  Iron studies were normal but ferritin was low at 25.  I would therefore recommend a trial of IV iron for her.  Discussed risks and benefits of Feraheme 510 mg IV weekly x2 including all but not limited to headache, leg swelling and possible risk of infusion reaction.  Patient understands and agrees to proceed as planned.  She will get her first dose of Feraheme today and second dose on 11/29/2018 along with her second cycle of chemotherapy.  I will see her back in [place] with CBC  and a CMP on 11/29/2018 for cycle 2 of chemotherapy   Visit Diagnosis 1. Neutrophilia   2. Iron deficiency anemia, unspecified iron deficiency anemia type      Dr. Randa Evens, MD, MPH [facility] at [facility] [phone] 11/15/2018 12:07 PM

## 2018-11-15 NOTE — Progress Notes (Signed)
No new changes noted today 

## 2018-11-15 NOTE — Patient Instructions (Signed)

## 2018-11-19 ENCOUNTER — Encounter: Payer: Self-pay | Admitting: Family Medicine

## 2018-11-19 ENCOUNTER — Ambulatory Visit (INDEPENDENT_AMBULATORY_CARE_PROVIDER_SITE_OTHER): Payer: Medicare Other | Admitting: Family Medicine

## 2018-11-19 VITALS — BP 144/76 | HR 79 | Temp 97.8°F | Resp 18 | Ht 59.0 in | Wt 208.4 lb

## 2018-11-19 DIAGNOSIS — R05 Cough: Secondary | ICD-10-CM

## 2018-11-19 DIAGNOSIS — Z79899 Other long term (current) drug therapy: Secondary | ICD-10-CM | POA: Diagnosis not present

## 2018-11-19 DIAGNOSIS — J4521 Mild intermittent asthma with (acute) exacerbation: Secondary | ICD-10-CM

## 2018-11-19 DIAGNOSIS — R058 Other specified cough: Secondary | ICD-10-CM

## 2018-11-19 DIAGNOSIS — D899 Disorder involving the immune mechanism, unspecified: Secondary | ICD-10-CM | POA: Diagnosis not present

## 2018-11-19 DIAGNOSIS — I10 Essential (primary) hypertension: Secondary | ICD-10-CM | POA: Diagnosis not present

## 2018-11-19 DIAGNOSIS — C541 Malignant neoplasm of endometrium: Secondary | ICD-10-CM

## 2018-11-19 DIAGNOSIS — D849 Immunodeficiency, unspecified: Secondary | ICD-10-CM

## 2018-11-19 MED ORDER — OLMESARTAN MEDOXOMIL-HCTZ 40-12.5 MG PO TABS: 1.0000 | ORAL_TABLET | Freq: Every day | ORAL | 0 refills | Status: DC

## 2018-11-19 MED ORDER — AZITHROMYCIN 250 MG PO TABS: ORAL_TABLET | ORAL | 0 refills | Status: DC

## 2018-11-19 MED ORDER — FLUTICASONE FUROATE-VILANTEROL 100-25 MCG/INH IN AEPB: 1.0000 | INHALATION_SPRAY | Freq: Every day | RESPIRATORY_TRACT | 0 refills | Status: DC

## 2018-11-19 MED ORDER — BENZONATATE 100 MG PO CAPS: 100.0000 mg | ORAL_CAPSULE | Freq: Two times a day (BID) | ORAL | 0 refills | Status: DC | PRN

## 2018-11-19 MED ORDER — ALBUTEROL SULFATE HFA 108 (90 BASE) MCG/ACT IN AERS: 2.0000 | INHALATION_SPRAY | RESPIRATORY_TRACT | 0 refills | Status: DC | PRN

## 2018-11-19 NOTE — Progress Notes (Signed)
Name: Molly [name]   MRN: [id number]    DOB: 10/30/1951   Date:11/19/2018       Progress Note  Subjective  Chief Complaint  Chief Complaint  Patient presents with  . Cough    patient presents today with a productive cough x 5 days. otc: theraflu. denies SOB or tightness in chest.    HPI  Cough: she states symptoms started 5 days ago with a dry cough, and sore throat, followed by a wet cough and when she expectorates it is yellow and tastes bitter. No fever or chills. She has a history of asthma and has noticed wheezing but no SOB. Symptoms are worse at night. Last chemotherapy was Nov 8th.   Patient Active Problem List   Diagnosis Date Noted  . Iron deficiency anemia 11/15/2018  . Endometrial adenocarcinoma ([place]) 10/29/2018  . Umbilical hernia without obstruction and without gangrene   . Atherosclerosis of right coronary artery 11/10/2017  . Atherosclerosis of abdominal aorta ([place]) 11/10/2017  . Prediabetes 10/02/2017  . Pain in limb 03/31/2017  . Perennial allergic rhinitis with seasonal variation 05/14/2016  . Asthma, mild intermittent, well-controlled 05/14/2016  . Hypertension goal BP (blood pressure) < 140/90 12/10/2015  . Cardiomyopathy due to hypertension ([place]) 12/10/2015  . History of CVA (cerebrovascular accident) 12/10/2015  . Hyperlipidemia LDL goal <70 12/10/2015  . Statin intolerance 12/10/2015    Past Surgical History:  Procedure Laterality Date  . ABDOMINAL HYSTERECTOMY    . CHOLECYSTECTOMY    . COLONOSCOPY WITH PROPOFOL N/A 01/08/2017   Procedure: COLONOSCOPY WITH PROPOFOL;  Surgeon: [name], MD;  Location: [facility] ENDOSCOPY;  Service: Endoscopy;  Laterality: N/A;  . COLONOSCOPY WITH PROPOFOL N/A 05/05/2018   Procedure: COLONOSCOPY WITH PROPOFOL;  Surgeon: [name], MD;  Location: [facility] ENDOSCOPY;  Service: Gastroenterology;  Laterality: N/A;  . HERNIA REPAIR  [id number]   umbilical  . PORTACATH PLACEMENT N/A 11/04/2018   Procedure: INSERTION PORT-A-CATH;   Surgeon: Jules Husbands, MD;  Location: [facility] ORS;  Service: General;  Laterality: N/A;  . SENTINEL NODE BIOPSY N/A 10/06/2018   Procedure: SENTINEL NODE BIOPSY;  Surgeon: Mellody Drown, MD;  Location: [facility] ORS;  Service: Gynecology;  Laterality: N/A;  . UMBILICAL HERNIA REPAIR N/A 11/17/2017   Procedure: HERNIA REPAIR UMBILICAL ADULT;  Surgeon: Jules Husbands, MD;  Location: [facility] ORS;  Service: General;  Laterality: N/A;    Family History  Problem Relation Age of Onset  . Stroke Mother   . Hypertension Mother   . Dementia Mother   . Alzheimer's disease Mother   . Gout Father   . Asthma Father   . Hypertension Father   . Dementia Father   . Healthy Sister   . Stroke Brother   . Alzheimer's disease Brother   . Healthy Daughter   . Hypertension Brother   . Healthy Brother   . Cancer Paternal Grandmother   . Healthy Sister   . Healthy Sister   . Hypertension Sister   . Hypertension Sister   . Stroke Brother   . Alzheimer's disease Brother   . Stroke Brother   . Hypertension Brother   . Stroke Brother   . Hypertension Brother   . Healthy Brother   . Healthy Brother   . Hypertension Daughter   . Breast cancer Neg Hx     Social History   Socioeconomic History  . Marital status: Divorced    Spouse name: Not on file  . Number of children: 2  . Years  of education: some college  . Highest education level: 12th grade  Occupational History  . 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 Occupation: currently a Careers adviser  . Financial resource strain: Not hard at all  . Food insecurity:    Worry: Never true    Inability: Never true  . Transportation needs:    Medical: No    Non-medical: No  Tobacco Use  . Smoking status: Never Smoker  . Smokeless tobacco: Never Used  . Tobacco comment: smoking cessation materials not required  Substance and Sexual Activity  . Alcohol use: No    Alcohol/week: 0.0 standard drinks  . Drug use: No  .  Sexual activity: Not Currently  Lifestyle  . Physical activity:    Days per week: 0 days    Minutes per session: 0 min  . Stress: Not at all  Relationships  . Social connections:    Talks on phone: More than three times a week    Gets together: Three times a week    Attends religious service: More than 4 times per year    Active member of club or organization: Yes    Attends meetings of clubs or organizations: More than 4 times per year    Relationship status: Divorced  . Intimate partner violence:    Fear of current or ex partner: No    Emotionally abused: No    Physically abused: No    Forced sexual activity: No  Other Topics Concern  . Not on file  Social History Narrative  . Not on file     Current Outpatient Medications:  .  acetaminophen (TYLENOL) 500 MG tablet, Take 2 tablets (1,000 mg total) by mouth every 6 (six) hours as needed for mild pain or moderate pain. (Patient not taking: Reported on 11/05/2018), Disp: 30 tablet, Rfl: 0 .  albuterol (PROVENTIL HFA;VENTOLIN HFA) 108 (90 Base) MCG/ACT inhaler, Inhale 2 puffs into the lungs every 4 (four) hours as needed for wheezing or shortness of breath., Disp: 1 Inhaler, Rfl: 0 .  azithromycin (ZITHROMAX) 250 MG tablet, Take 2 first day and one daily after that, Disp: 6 tablet, Rfl: 0 .  benzonatate (TESSALON) 100 MG capsule, Take 1-2 capsules (100-200 mg total) by mouth 2 (two) times daily as needed., Disp: 40 capsule, Rfl: 0 .  dexamethasone (DECADRON) 4 MG tablet, Take 2 tablets (8 mg total) by mouth daily. Start the day after chemotherapy for 2 days. (Patient not taking: Reported on 11/05/2018), Disp: 30 tablet, Rfl: 1 .  diclofenac sodium (VOLTAREN) 1 % GEL, Apply 2 g topically 3 (three) times daily as needed (pain). (Patient not taking: Reported on 11/05/2018), Disp: 100 g, Rfl: 1 .  escitalopram (LEXAPRO) 5 MG tablet, Take 1 tablet (5 mg total) by mouth daily., Disp: 90 tablet, Rfl: 0 .  ezetimibe (ZETIA) 10 MG tablet, Take 1  tablet (10 mg total) by mouth daily., Disp: 90 tablet, Rfl: 1 .  fluticasone furoate-vilanterol (BREO ELLIPTA) 100-25 MCG/INH AEPB, Inhale 1 puff into the lungs daily., Disp: 60 each, Rfl: 0 .  furosemide (LASIX) 40 MG tablet, Take 1 tablet (40 mg total) by mouth daily. (Patient taking differently: Take 40 mg by mouth daily as needed for fluid. ), Disp: 90 tablet, Rfl: 1 .  ketoconazole (NIZORAL) 2 % cream, Apply 1 application topically daily as needed for irritation. On abdominal fold prn (Patient not taking: Reported on 11/02/2018), Disp: 60 g,  Rfl: 0 .  lidocaine-prilocaine (EMLA) cream, Apply to affected area once (Patient not taking: Reported on 11/05/2018), Disp: 30 g, Rfl: 3 .  loratadine (CLARITIN) 10 MG tablet, Take 1 tablet (10 mg total) by mouth daily., Disp: 100 tablet, Rfl: 0 .  LORazepam (ATIVAN) 0.5 MG tablet, Take 1 tablet (0.5 mg total) by mouth every 6 (six) hours as needed (Nausea or vomiting). (Patient not taking: Reported on 11/05/2018), Disp: 30 tablet, Rfl: 0 .  olmesartan-hydrochlorothiazide (BENICAR HCT) 40-12.5 MG tablet, Take 1 tablet by mouth daily., Disp: 90 tablet, Rfl: 0 .  omeprazole (PRILOSEC OTC) 20 MG tablet, Take 20 mg by mouth daily as needed., Disp: , Rfl:  .  ondansetron (ZOFRAN) 8 MG tablet, Take 1 tablet (8 mg total) by mouth 2 (two) times daily as needed for refractory nausea / vomiting. Start on day 3 after chemo. (Patient not taking: Reported on 11/05/2018), Disp: 30 tablet, Rfl: 1 .  prochlorperazine (COMPAZINE) 10 MG tablet, Take 1 tablet (10 mg total) by mouth every 6 (six) hours as needed (Nausea or vomiting). (Patient not taking: Reported on 11/05/2018), Disp: 30 tablet, Rfl: 1 No current facility-administered medications for this visit.   Facility-Administered Medications Ordered in Other Visits:  .  sodium chloride flush (NS) 0.9 % injection 10 mL, 10 mL, Intravenous, PRN, Sindy Guadeloupe, MD, 10 mL at 11/15/18 0915  Allergies  Allergen Reactions  .  Nsaids Hives and Rash  . Statins Other (See Comments)    Joint pains  . Victoza [Liraglutide] Other (See Comments)    pancreatitis  . Oxycodone Nausea And Vomiting  . Crestor [Rosuvastatin Calcium] Rash    I personally reviewed active problem list, medication list, allergies, family history, social history with the patient/caregiver today.   ROS  Constitutional: Negative for fever or weight change.  Respiratory: Positive  for cough, wheezing but no  shortness of breath.   Cardiovascular: Negative for chest pain or palpitations.  Gastrointestinal: Negative for abdominal pain, no bowel changes.  Musculoskeletal: Negative for gait problem or joint swelling.  Skin: Negative for rash.  Neurological: Negative for dizziness or headache.  No other specific complaints in a complete review of systems (except as listed in HPI above).  Objective  Vitals:   11/19/18 1327  BP: (!) 144/76  Pulse: 79  Resp: 18  Temp: 97.8 F (36.6 C)  TempSrc: Oral  SpO2: 97%  Weight: 208 lb 6.4 oz (94.5 kg)  Height: 4\' 11"  (1.499 m)    Body mass index is 42.09 kg/m.  Physical Exam  Constitutional: Patient appears well-developed and well-nourished. Obese  No distress.  HEENT: head atraumatic, normocephalic, pupils equal and reactive to light, ears normal TM, neck supple, throat within normal limits Cardiovascular: Normal rate, regular rhythm and normal heart sounds.  No murmur heard. No BLE edema. Pulmonary/Chest: Effort normal and breath sounds normal. No respiratory distress. Abdominal: Soft.  There is no tenderness. Psychiatric: Patient has a normal mood and affect. behavior is normal. Judgment and thought content normal.  PHQ2/9: Depression screen [facility] 2/9 11/19/2018 10/20/2018 09/07/2018 05/07/2018 04/06/2018  Decreased Interest 0 0 1 1 0  Down, Depressed, Hopeless 0 0 1 0 0  PHQ - 2 Score 0 0 2 1 0  Altered sleeping 0 0 1 0 0  Tired, decreased energy 0 0 2 2 0  Change in appetite 0 0 2 0 0   Feeling bad or failure about yourself  0 0 0 0 0  Trouble concentrating 0  0 1 1 0  Moving slowly or fidgety/restless 0 0 0 0 0  Suicidal thoughts 0 0 0 0 0  PHQ-9 Score 0 0 8 4 0  Difficult doing work/chores Not difficult at all Not difficult at all Somewhat difficult Not difficult at all Not difficult at all     Fall Risk: Fall Risk  11/19/2018 11/01/2018 10/20/2018 09/07/2018 05/07/2018  Falls in the past year? 0 0 No No No  Risk for fall due to : - - - - -  Risk for fall due to: Comment - - - - -    Functional Status Survey: Is the patient deaf or have difficulty hearing?: No Does the patient have difficulty seeing, even when wearing glasses/contacts?: No Does the patient have difficulty concentrating, remembering, or making decisions?: No Does the patient have difficulty walking or climbing stairs?: No Does the patient have difficulty dressing or bathing?: No Does the patient have difficulty doing errands alone such as visiting a doctor's office or shopping?: No    Assessment & Plan  1. Hypertension goal BP (blood pressure) < 140/90  - olmesartan-hydrochlorothiazide (BENICAR HCT) 40-12.5 MG tablet; Take 1 tablet by mouth daily.  Dispense: 90 tablet; Refill: 0  2. Chronic asthma, mild intermittent, with acute exacerbation  - albuterol (PROVENTIL HFA;VENTOLIN HFA) 108 (90 Base) MCG/ACT inhaler; Inhale 2 puffs into the lungs every 4 (four) hours as needed for wheezing or shortness of breath.  Dispense: 1 Inhaler; Refill: 0 - fluticasone furoate-vilanterol (BREO ELLIPTA) 100-25 MCG/INH AEPB; Inhale 1 puff into the lungs daily.  Dispense: 60 each; Refill: 0  3. Productive cough  Explained to patient likely viral illness, but because of history of asthma and currently on chemotherapy we will treat with antibiotics, also discussed red flags with patient and mother, need to go to [facility] if fever, chills, worsening of symptoms.  - azithromycin (ZITHROMAX) 250 MG tablet; Take 2 first day  and one daily after that  Dispense: 6 tablet; Refill: 0 - benzonatate (TESSALON) 100 MG capsule; Take 1-2 capsules (100-200 mg total) by mouth 2 (two) times daily as needed.  Dispense: 40 capsule; Refill: 0  4. Immunosuppressed status ([place])  - azithromycin (ZITHROMAX) 250 MG tablet; Take 2 first day and one daily after that  Dispense: 6 tablet; Refill: 0  5. On antineoplastic chemotherapy   6. Endometrial adenocarcinoma ([place])

## 2018-11-22 ENCOUNTER — Telehealth: Payer: Self-pay | Admitting: Obstetrics and Gynecology

## 2018-11-22 ENCOUNTER — Encounter: Payer: Self-pay | Admitting: Oncology

## 2018-11-22 ENCOUNTER — Telehealth: Payer: Self-pay | Admitting: *Deleted

## 2018-11-22 NOTE — Telephone Encounter (Signed)
Lela called to erport that patient is having vaginal spotting and is asking if she needs to be seen or not. Please advise. Her call back is [phone]

## 2018-11-22 NOTE — Telephone Encounter (Signed)
I have discussed with Lauren and she is going to call and discuss this with Lela

## 2018-11-22 NOTE — Telephone Encounter (Signed)
The patient's daughter, Dennison Bulla, called and stated the patient is having some bleeding x4 days, and she was wondering if Dr. Marcelline Mates could contact her mother at her cell number, please advise, thanks.

## 2018-11-22 NOTE — Telephone Encounter (Signed)
thanks

## 2018-11-22 NOTE — Telephone Encounter (Signed)
If it is just some spotting and not frank profuse vaginal bleeding, it is ok not to be seen. If bleeding increases in the next 24 hours, she should let us know. Molly [name] you can get in touch with Lauren in that case and see if patient can be scheduled to eb seen by gyn onc this Wednesday (this is only if bleeding increases)

## 2018-11-22 NOTE — Telephone Encounter (Signed)
Pt stated that she has been bleeding and spotting off and on for 4 days. Pt stated that she is not having any pain or cramping from the bleeding. Pt made an appointment to be seen for her bleeding.

## 2018-11-23 ENCOUNTER — Ambulatory Visit (INDEPENDENT_AMBULATORY_CARE_PROVIDER_SITE_OTHER): Payer: Medicare Other | Admitting: Obstetrics and Gynecology

## 2018-11-23 ENCOUNTER — Encounter: Payer: Self-pay | Admitting: Obstetrics and Gynecology

## 2018-11-23 VITALS — BP 149/84 | HR 76 | Ht 59.0 in | Wt 207.3 lb

## 2018-11-23 DIAGNOSIS — N939 Abnormal uterine and vaginal bleeding, unspecified: Secondary | ICD-10-CM

## 2018-11-23 DIAGNOSIS — C541 Malignant neoplasm of endometrium: Secondary | ICD-10-CM

## 2018-11-23 NOTE — Telephone Encounter (Signed)
I called Molly [name] to follow up. Patient saw Dr. Marcelline Mates today for pelvic. Her note is not yet available but Molly [name] says 'all is ok' and delayed healing with some visible sutures. I'll mention to Dr. Fransisca Connors tomorrow as well.

## 2018-11-23 NOTE — Progress Notes (Signed)
Pt called yesterday stating that she has started bleeding again. Pt stated that she noticed spotting yesterday pt stated that she has pain/pressure the lower abd area.

## 2018-11-24 NOTE — Progress Notes (Signed)
    GYNECOLOGY PROGRESS NOTE  Subjective:    Patient ID: Molly [name], female    DOB: [birth year], 67 y.o.   MRN: [id number]  HPI.  Patient is a 67 y.o. G32P2002 female who presents for complaints of spotting yesterday and pain/pressure in the lower abdominal area.  Of note, she is 7 weeks s/p TLH, BSO and pelvic lymph node dissection for endometrial adenocarcinoma after experiencing PMB.  She is currently undergoing chemotherapy as it was noted to be subtype FIGO Stage IIIC2 pT1a pN2M0.   The following portions of the patient's history were reviewed and updated as appropriate: allergies, current medications, past family history, past medical history, past social history, past surgical history and problem list.  Review of Systems Pertinent items noted in HPI and remainder of comprehensive ROS otherwise negative.   Objective:   Blood pressure (!) 149/84, pulse 76, height 4\' 11"  (1.499 m), weight 207 lb 4.8 oz (94 kg). General appearance: alert and no distress Abdomen: soft, non-tender; bowel sounds normal; no masses,  no organomegaly Pelvic: external genitalia normal, rectovaginal septum normal.  Vagina without discharge or blood.  Uterus and cervix surgically absent. Vaginal cuff healing well, with several sutures present.  Adnexae surgically removed. No masses palpable.    Assessment:  Vaginal spotting Endometrial adenocarcinoma s/p TLH, BSO and pelvic node dissection undergoing chemotherapy  Plan:   - Given patient reassurance. Discussed that her body is still healing, however the healing process may be somewhat slowed by her receiving chemotherapy during her post-operative state, as well as having a recent asthma exacerbation (on review of chart), with more coughing which could aggravate her pelvic floor and stitches and cause spotting/bleeding. Patient notes understanding.  - RTC if symptoms worsen or fail to improve. Otherwise,  return for routine q 3-4 month checks rotating between  GYN and Oncology.

## 2018-11-26 ENCOUNTER — Encounter: Payer: Self-pay | Admitting: Obstetrics and Gynecology

## 2018-11-29 ENCOUNTER — Inpatient Hospital Stay: Payer: Medicare Other

## 2018-11-29 ENCOUNTER — Inpatient Hospital Stay: Payer: Medicare Other | Attending: Oncology | Admitting: Oncology

## 2018-11-29 ENCOUNTER — Other Ambulatory Visit: Payer: Medicare Other

## 2018-11-29 ENCOUNTER — Encounter: Payer: Self-pay | Admitting: Oncology

## 2018-11-29 VITALS — BP 129/84 | HR 70 | Temp 95.0°F | Resp 18

## 2018-11-29 VITALS — BP 156/83 | HR 71 | Temp 97.4°F | Resp 16 | Ht 59.0 in | Wt 209.0 lb

## 2018-11-29 DIAGNOSIS — C541 Malignant neoplasm of endometrium: Secondary | ICD-10-CM

## 2018-11-29 DIAGNOSIS — E669 Obesity, unspecified: Secondary | ICD-10-CM

## 2018-11-29 DIAGNOSIS — N939 Abnormal uterine and vaginal bleeding, unspecified: Secondary | ICD-10-CM

## 2018-11-29 DIAGNOSIS — D509 Iron deficiency anemia, unspecified: Secondary | ICD-10-CM

## 2018-11-29 DIAGNOSIS — Z5111 Encounter for antineoplastic chemotherapy: Secondary | ICD-10-CM | POA: Diagnosis not present

## 2018-11-29 DIAGNOSIS — T8090XA Unspecified complication following infusion and therapeutic injection, initial encounter: Secondary | ICD-10-CM

## 2018-11-29 LAB — CBC WITH DIFFERENTIAL/PLATELET
ABS IMMATURE GRANULOCYTES: 0.02 10*3/uL (ref 0.00–0.07)
BASOS PCT: 1 %
Basophils Absolute: 0 10*3/uL (ref 0.0–0.1)
EOS ABS: 0.1 10*3/uL (ref 0.0–0.5)
Eosinophils Relative: 1 %
HEMATOCRIT: 31.9 % — AB (ref 36.0–46.0)
Hemoglobin: 10.6 g/dL — ABNORMAL LOW (ref 12.0–15.0)
Immature Granulocytes: 0 %
LYMPHS ABS: 1.6 10*3/uL (ref 0.7–4.0)
Lymphocytes Relative: 36 %
MCH: 29.7 pg (ref 26.0–34.0)
MCHC: 33.2 g/dL (ref 30.0–36.0)
MCV: 89.4 fL (ref 80.0–100.0)
MONO ABS: 0.4 10*3/uL (ref 0.1–1.0)
MONOS PCT: 10 %
Neutro Abs: 2.4 10*3/uL (ref 1.7–7.7)
Neutrophils Relative %: 52 %
PLATELETS: 354 10*3/uL (ref 150–400)
RBC: 3.57 MIL/uL — ABNORMAL LOW (ref 3.87–5.11)
RDW: 13.5 % (ref 11.5–15.5)
WBC: 4.5 10*3/uL (ref 4.0–10.5)
nRBC: 0 % (ref 0.0–0.2)

## 2018-11-29 LAB — COMPREHENSIVE METABOLIC PANEL
ALBUMIN: 4 g/dL (ref 3.5–5.0)
ALK PHOS: 96 U/L (ref 38–126)
ALT: 22 U/L (ref 0–44)
AST: 22 U/L (ref 15–41)
Anion gap: 9 (ref 5–15)
BILIRUBIN TOTAL: 0.9 mg/dL (ref 0.3–1.2)
BUN: 13 mg/dL (ref 8–23)
CALCIUM: 9.3 mg/dL (ref 8.9–10.3)
CO2: 27 mmol/L (ref 22–32)
Chloride: 103 mmol/L (ref 98–111)
Creatinine, Ser: 0.6 mg/dL (ref 0.44–1.00)
GFR calc Af Amer: >60 mL/min (ref >60)
GFR calc non Af Amer: >60 mL/min (ref >60)
GLUCOSE: 103 mg/dL — AB (ref 70–99)
Potassium: 3.7 mmol/L (ref 3.5–5.1)
Sodium: 139 mmol/L (ref 135–145)
TOTAL PROTEIN: 7.3 g/dL (ref 6.5–8.1)

## 2018-11-29 MED ORDER — DIPHENHYDRAMINE HCL 50 MG/ML IJ SOLN
50.0000 mg | Freq: Once | INTRAMUSCULAR | Status: AC
  Administered 2018-11-29: 50 mg via INTRAVENOUS
  Filled 2018-11-29: qty 1

## 2018-11-29 MED ORDER — HYDROCORTISONE NA SUCCINATE PF 100 MG IJ SOLR
100.0000 mg | Freq: Once | INTRAMUSCULAR | Status: AC
  Administered 2018-11-29: 100 mg via INTRAVENOUS

## 2018-11-29 MED ORDER — FAMOTIDINE IN NACL 20-0.9 MG/50ML-% IV SOLN
20.0000 mg | Freq: Once | INTRAVENOUS | Status: AC
  Administered 2018-11-29: 20 mg via INTRAVENOUS
  Filled 2018-11-29: qty 50

## 2018-11-29 MED ORDER — SODIUM CHLORIDE 0.9 % IV SOLN
540.0000 mg | Freq: Once | INTRAVENOUS | Status: AC
  Administered 2018-11-29: 540 mg via INTRAVENOUS
  Filled 2018-11-29: qty 54

## 2018-11-29 MED ORDER — PALONOSETRON HCL INJECTION 0.25 MG/5ML
0.2500 mg | Freq: Once | INTRAVENOUS | Status: AC
  Administered 2018-11-29: 0.25 mg via INTRAVENOUS
  Filled 2018-11-29: qty 5

## 2018-11-29 MED ORDER — SODIUM CHLORIDE 0.9 % IV SOLN
Freq: Once | INTRAVENOUS | Status: AC
  Administered 2018-11-29: 10:00:00 via INTRAVENOUS
  Filled 2018-11-29: qty 250

## 2018-11-29 MED ORDER — SODIUM CHLORIDE 0.9% FLUSH
10.0000 mL | INTRAVENOUS | Status: DC | PRN
  Administered 2018-11-29: 10 mL via INTRAVENOUS
  Filled 2018-11-29: qty 10

## 2018-11-29 MED ORDER — SODIUM CHLORIDE 0.9 % IV SOLN
175.0000 mg/m2 | Freq: Once | INTRAVENOUS | Status: AC
  Administered 2018-11-29: 348 mg via INTRAVENOUS
  Filled 2018-11-29: qty 58

## 2018-11-29 MED ORDER — PROCHLORPERAZINE EDISYLATE 10 MG/2ML IJ SOLN
10.0000 mg | Freq: Once | INTRAMUSCULAR | Status: AC
  Administered 2018-11-29: 10 mg via INTRAVENOUS

## 2018-11-29 MED ORDER — HEPARIN SOD (PORK) LOCK FLUSH 100 UNIT/ML IV SOLN
500.0000 [IU] | Freq: Once | INTRAVENOUS | Status: AC
  Administered 2018-11-29: 500 [IU] via INTRAVENOUS
  Filled 2018-11-29: qty 5

## 2018-11-29 MED ORDER — SODIUM CHLORIDE 0.9 % IV SOLN
20.0000 mg | Freq: Once | INTRAVENOUS | Status: AC
  Administered 2018-11-29: 20 mg via INTRAVENOUS
  Filled 2018-11-29: qty 2

## 2018-11-29 MED ORDER — PEGFILGRASTIM 6 MG/0.6ML ~~LOC~~ PSKT
6.0000 mg | PREFILLED_SYRINGE | Freq: Once | SUBCUTANEOUS | Status: AC
  Administered 2018-11-29: 6 mg via SUBCUTANEOUS
  Filled 2018-11-29: qty 0.6

## 2018-11-29 MED ORDER — SODIUM CHLORIDE 0.9 % IV SOLN
Freq: Once | INTRAVENOUS | Status: DC
  Filled 2018-11-29: qty 250

## 2018-11-29 MED ORDER — SODIUM CHLORIDE 0.9 % IV SOLN
510.0000 mg | Freq: Once | INTRAVENOUS | Status: AC
  Administered 2018-11-29: 510 mg via INTRAVENOUS
  Filled 2018-11-29: qty 17

## 2018-11-29 MED ORDER — SODIUM CHLORIDE 0.9 % IV SOLN
Freq: Once | INTRAVENOUS | Status: AC
  Administered 2018-11-29: 11:00:00 via INTRAVENOUS
  Filled 2018-11-29: qty 250

## 2018-11-29 MED ORDER — DIPHENHYDRAMINE HCL 50 MG/ML IJ SOLN
50.0000 mg | Freq: Once | INTRAMUSCULAR | Status: AC
  Administered 2018-11-29: 50 mg via INTRAVENOUS

## 2018-11-29 NOTE — Progress Notes (Signed)
Patient just started 2nd infusion of feraheme when she suddenly became flushed, hot, diaphoretic and complained of severe, excruciating  abd pain. Sudden onset of gagging, vomiting. Feraheme stopped and MD notified. Emergency meds administered. Ordered 584ml bolus of NS prior to initiating any chemotherapy. MD reassessed patient after NS bolus. BP 106/ 74 HR 73 Temp 96.8. MD states to proceed with chemo. Patient is resting well. Not flushed. Denies any pain or nausea. Feeling tired after all the benadryl.Patient and daughter agreeable to start chemo. 1517; Patient has slept through the entire afternoon. Arouses easily. Denies pain. Denies any discomfort. Tolerating chemotherapy. Alert, oriented and ambulatory at discharge. No complaints of any discomfort.

## 2018-11-29 NOTE — Progress Notes (Signed)
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 Marcelline Mates for cystocele and rectocele. She was noted to have postmenopausal bleeding and cramping recently. Ultrasound revealed heterogeneous appearance of uterus with fibroids invading the endometrium. She underwent an endometrial biopsy when she had a second bout of postmenopausal bleeding. Biopsy showed endometrioid adenocarcinoma FIGO grade 1. She was then seen by Dr. Fransisca Connors and plan was to proceed with laparoscopic hysterectomy and bilateral salpingo-oophorectomy along with pelvic lymph node sampling  Final pathology showed: Endometrioid carcinoma FIGO grade 2 with 33% myometrial invasion. Lymphovascular invasion present. Peritoneal/ascites fluid atypical. 2 out of 3 sentinel lymph nodes were positive for macro metastases. pT1a pN1a. FIGO IIIC1  Patient's case was discussed at tumor board and consensus was to proceed with 3 cycles of adjuvant carbotaxol followed by radiation followed by 3 more cycles of chemotherapy. Patient is already met with Dr. Donella Stade who will be giving radiation therapy to pelvic and periaortic lymph nodes and boost to her vaginal cuff. PET CT scan showed metastatic adenopathy and retroperitoneal and external iliac group of lymph nodes in the right side.  No evidence of metastatic disease  elsewhere.  First cycle of chemotherapy was given on 11/05/2018   Interval history-patient was complaining of some vaginal bleeding about 10 days ago and was seen by GYN Dr. Marcelline Mates.  Her bleeding has stopped since then and plan was to continue conservative monitoring since the bleeding was minimal and likely postoperative in the setting of chemotherapy.  Today she feels fatigued especially 2 weeks after chemotherapy.  Denies any nausea vomiting or other complaints.  Denies any tingling numbness in her extremities  ECOG PS- 1 Pain scale- 0 Opioid associated constipation- no  Review of systems- Review of Systems  Constitutional: Positive for malaise/fatigue. Negative for chills, fever and weight loss.  HENT: Negative for congestion, ear discharge and nosebleeds.   Eyes: Negative for blurred vision.  Respiratory: Negative for cough, hemoptysis, sputum production, shortness of breath and wheezing.   Cardiovascular: Negative for chest pain, palpitations, orthopnea and claudication.  Gastrointestinal: Negative for abdominal pain, blood in stool, constipation, diarrhea, heartburn, melena, nausea and vomiting.  Genitourinary: Negative for dysuria, flank pain, frequency, hematuria and urgency.  Musculoskeletal: Negative for back pain, joint pain and myalgias.  Skin: Negative for rash.  Neurological: Negative for dizziness, tingling, focal weakness, seizures, weakness and headaches.  Endo/Heme/Allergies: Does not bruise/bleed easily.  Psychiatric/Behavioral: Negative for depression and suicidal ideas. The patient does not have insomnia.       Allergies  Allergen Reactions  . Nsaids Hives and Rash  . Statins Other (See Comments)    Joint pains  . Victoza [Liraglutide] Other (See Comments)    pancreatitis  . Oxycodone Nausea And Vomiting  . Crestor [Rosuvastatin Calcium] Rash     Past Medical History:  Diagnosis Date  . Anxiety   . Asthma    history of asthma  . Cardiomyopathy ([place])    .  Complication of anesthesia    tore hair out and made teeth rough  . Depression   . Endometrial cancer ([place]) 08/2018  . GERD (gastroesophageal reflux disease)    takes prilosec prn  . History of CVA (cerebrovascular accident) 12/10/2015  . Hyperlipidemia LDL goal <70 12/10/2015  . Hypertension   . Prediabetes 10/02/2017   A1c 6 in January 2018  . Stroke [facility]) 1990    no residual effects     Past Surgical History:  Procedure Laterality Date  . ABDOMINAL HYSTERECTOMY    . CHOLECYSTECTOMY    . COLONOSCOPY WITH PROPOFOL N/A 01/08/2017   Procedure: COLONOSCOPY WITH PROPOFOL;  Surgeon: [name], MD;  Location: [facility] ENDOSCOPY;  Service: Endoscopy;  Laterality: N/A;  . COLONOSCOPY WITH PROPOFOL N/A 05/05/2018   Procedure: COLONOSCOPY WITH PROPOFOL;  Surgeon: [name], MD;  Location: [facility] ENDOSCOPY;  Service: Gastroenterology;  Laterality: N/A;  . HERNIA REPAIR  [id number]   umbilical  . PORTACATH PLACEMENT N/A 11/04/2018   Procedure: INSERTION PORT-A-CATH;  Surgeon: Jules Husbands, MD;  Location: [facility] ORS;  Service: General;  Laterality: N/A;  . SENTINEL NODE BIOPSY N/A 10/06/2018   Procedure: SENTINEL NODE BIOPSY;  Surgeon: Mellody Drown, MD;  Location: [facility] ORS;  Service: Gynecology;  Laterality: N/A;  . UMBILICAL HERNIA REPAIR N/A 11/17/2017   Procedure: HERNIA REPAIR UMBILICAL ADULT;  Surgeon: Jules Husbands, MD;  Location: [facility] ORS;  Service: General;  Laterality: N/A;    Social History   Socioeconomic History  . Marital status: Divorced    Spouse name: Not on file  . Number of children: 2  . Years of education: some college  . Highest education level: 12th grade  Occupational History  . 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 Occupation: currently a Careers adviser  . Financial resource strain: Not hard at all  . Food insecurity:    Worry: Never true    Inability: Never true  . Transportation needs:    Medical: No    Non-medical: No    Tobacco Use  . Smoking status: Never Smoker  . Smokeless tobacco: Never Used  . Tobacco comment: smoking cessation materials not required  Substance and Sexual Activity  . Alcohol use: No    Alcohol/week: 0.0 standard drinks  . Drug use: No  . Sexual activity: Not Currently  Lifestyle  . Physical activity:    Days per week: 0 days    Minutes per session: 0 min  . Stress: Not at all  Relationships  . Social connections:    Talks on phone: More than three times a week    Gets together: Three times a week    Attends religious service: More than 4 times per year    Active member of club or organization: Yes    Attends meetings of clubs or organizations: More than 4 times per year    Relationship status: Divorced  . Intimate partner violence:    Fear of current or ex partner: No    Emotionally abused: No    Physically abused: No    Forced sexual activity: No  Other Topics Concern  . Not on file  Social History Narrative  . Not on file    Family History  Problem Relation Age of Onset  . Stroke Mother   . Hypertension Mother   . Dementia Mother   . Alzheimer's disease Mother   . Gout Father   . Asthma  Father   . Hypertension Father   . Dementia Father   . Healthy Sister   . Stroke Brother   . Alzheimer's disease Brother   . Healthy Daughter   . Hypertension Brother   . Healthy Brother   . Cancer Paternal Grandmother   . Healthy Sister   . Healthy Sister   . Hypertension Sister   . Hypertension Sister   . Stroke Brother   . Alzheimer's disease Brother   . Stroke Brother   . Hypertension Brother   . Stroke Brother   . Hypertension Brother   . Healthy Brother   . Healthy Brother   . Hypertension Daughter   . Breast cancer Neg Hx      Current Outpatient Medications:  .  acetaminophen (TYLENOL) 500 MG tablet, Take 2 tablets (1,000 mg total) by mouth every 6 (six) hours as needed for mild pain or moderate pain., Disp: 30 tablet, Rfl: 0 .  albuterol (PROVENTIL  HFA;VENTOLIN HFA) 108 (90 Base) MCG/ACT inhaler, Inhale 2 puffs into the lungs every 4 (four) hours as needed for wheezing or shortness of breath., Disp: 1 Inhaler, Rfl: 0 .  dexamethasone (DECADRON) 4 MG tablet, Take 2 tablets (8 mg total) by mouth daily. Start the day after chemotherapy for 2 days., Disp: 30 tablet, Rfl: 1 .  diclofenac sodium (VOLTAREN) 1 % GEL, Apply 2 g topically 3 (three) times daily as needed (pain)., Disp: 100 g, Rfl: 1 .  escitalopram (LEXAPRO) 5 MG tablet, Take 1 tablet (5 mg total) by mouth daily., Disp: 90 tablet, Rfl: 0 .  ezetimibe (ZETIA) 10 MG tablet, Take 1 tablet (10 mg total) by mouth daily., Disp: 90 tablet, Rfl: 1 .  fluticasone furoate-vilanterol (BREO ELLIPTA) 100-25 MCG/INH AEPB, Inhale 1 puff into the lungs daily., Disp: 60 each, Rfl: 0 .  furosemide (LASIX) 40 MG tablet, Take 1 tablet (40 mg total) by mouth daily. (Patient taking differently: Take 40 mg by mouth daily as needed for fluid. ), Disp: 90 tablet, Rfl: 1 .  ketoconazole (NIZORAL) 2 % cream, Apply 1 application topically daily as needed for irritation. On abdominal fold prn, Disp: 60 g, Rfl: 0 .  lidocaine-prilocaine (EMLA) cream, Apply to affected area once, Disp: 30 g, Rfl: 3 .  loratadine (CLARITIN) 10 MG tablet, Take 1 tablet (10 mg total) by mouth daily., Disp: 100 tablet, Rfl: 0 .  LORazepam (ATIVAN) 0.5 MG tablet, Take 1 tablet (0.5 mg total) by mouth every 6 (six) hours as needed (Nausea or vomiting)., Disp: 30 tablet, Rfl: 0 .  olmesartan-hydrochlorothiazide (BENICAR HCT) 20-12.5 MG tablet, Take 1 tablet by mouth daily., Disp: , Rfl:  .  omeprazole (PRILOSEC OTC) 20 MG tablet, Take 20 mg by mouth daily as needed., Disp: , Rfl:  .  ondansetron (ZOFRAN) 8 MG tablet, Take 1 tablet (8 mg total) by mouth 2 (two) times daily as needed for refractory nausea / vomiting. Start on day 3 after chemo., Disp: 30 tablet, Rfl: 1 .  prochlorperazine (COMPAZINE) 10 MG tablet, Take 10 mg by mouth every 6  (six) hours as needed for nausea or vomiting., Disp: , Rfl:  No current facility-administered medications for this visit.   Facility-Administered Medications Ordered in Other Visits:  .  0.9 %  sodium chloride infusion, , Intravenous, Once, Sindy Guadeloupe, MD .  CARBOplatin (PARAPLATIN) 540 mg in sodium chloride 0.9 % 250 mL chemo infusion, 540 mg, Intravenous, Once, Sindy Guadeloupe, MD .  heparin lock flush 100  unit/mL, 500 Units, Intravenous, Once, Sindy Guadeloupe, MD .  PACLitaxel (TAXOL) 348 mg in sodium chloride 0.9 % 500 mL chemo infusion (> 64m/m2), 175 mg/m2 (Treatment Plan Recorded), Intravenous, Once, RSindy Guadeloupe MD, Last Rate: 186 mL/hr at 11/29/18 1142, 348 mg at 11/29/18 1142 .  pegfilgrastim (NEULASTA ONPRO KIT) injection 6 mg, 6 mg, Subcutaneous, Once, RRanda EvensC, MD .  sodium chloride flush (NS) 0.9 % injection 10 mL, 10 mL, Intravenous, PRN, RSindy Guadeloupe MD, 10 mL at 11/15/18 0915 .  sodium chloride flush (NS) 0.9 % injection 10 mL, 10 mL, Intravenous, PRN, RSindy Guadeloupe MD, 10 mL at 11/29/18 0845  Physical exam:  Vitals:   11/29/18 0913  BP: (!) 156/83  Pulse: 71  Resp: 16  Temp: (!) 97.4 F (36.3 C)  TempSrc: Tympanic  Weight: 208 lb 15.9 oz (94.8 kg)  Height: 4' 11"  (1.499 m)   Physical Exam  Constitutional: She is oriented to person, place, and time.  Patient is obese.  Does not appear to be in any acute distress  HENT:  Head: Normocephalic and atraumatic.  Eyes: Pupils are equal, round, and reactive to light. EOM are normal.  Neck: Normal range of motion.  Cardiovascular: Normal rate, regular rhythm and normal heart sounds.  Pulmonary/Chest: Effort normal and breath sounds normal.  Abdominal: Soft. Bowel sounds are normal.  Neurological: She is alert and oriented to person, place, and time.  Skin: Skin is warm and dry.     CMP Latest Ref Rng & Units 11/29/2018  Glucose 70 - 99 mg/dL 103(H)  BUN 8 - 23 mg/dL 13  Creatinine 0.44 - 1.00 mg/dL  0.60  Sodium 135 - 145 mmol/L 139  Potassium 3.5 - 5.1 mmol/L 3.7  Chloride 98 - 111 mmol/L 103  CO2 22 - 32 mmol/L 27  Calcium 8.9 - 10.3 mg/dL 9.3  Total Protein 6.5 - 8.1 g/dL 7.3  Total Bilirubin 0.3 - 1.2 mg/dL 0.9  Alkaline Phos 38 - 126 U/L 96  AST 15 - 41 U/L 22  ALT 0 - 44 U/L 22   CBC Latest Ref Rng & Units 11/29/2018  WBC 4.0 - 10.5 K/uL 4.5  Hemoglobin 12.0 - 15.0 g/dL 10.6(L)  Hematocrit 36.0 - 46.0 % 31.9(L)  Platelets 150 - 400 K/uL 354    No images are attached to the encounter.  Dg Chest Port 1 View  Result Date: 11/04/2018 CLINICAL DATA:  Port-A-Cath placement, history of endometrial adenocarcinoma EXAM: PORTABLE CHEST 1 VIEW COMPARISON:  CT chest of 10/22/2018 and chest x-ray of 04/07/2018 FINDINGS: A right-sided Port-A-Cath is now present with the tip seen to the expected SVC-RA junction. No pneumothorax is seen. Mild left basilar atelectasis is present. Mediastinal and hilar contours are unremarkable. Cardiomegaly is stable. No acute bony abnormality is seen. IMPRESSION: 1. Right-sided Port-A-Cath tip is seen to the expected SVC-RA junction. No pneumothorax. 2. Stable cardiomegaly. Electronically Signed   By: PIvar DrapeM.D.   On: 11/04/2018 13:31   Dg C-arm 1-60 Min-no Report  Result Date: 11/04/2018 Fluoroscopy was utilized by the requesting physician.  No radiographic interpretation.     Assessment and plan- Patient is a 67y.o. female with invasive adenocarcinoma of the endometrium endometrioid subtypeFIGO Stage IIIC2 pT1a N2 M0.  She is here for on treatment assessment prior to cycle 2 of carbotaxol chemotherapy  Counts okay to proceed with cycle 2 of carboplatin and Taxol chemotherapy today with on pro-Neulasta support.  I will see  her back in 3 weeks time with CBC and CMP prior to cycle #3.  We will also touch base with radiation oncology this time since the plan was to proceed with radiation after 3 cycles of chemotherapy and she was to receive 3 more  cycles of chemotherapy upon completion of radiation treatment.  Iron deficiency anemia: Patient received 1 dose of Feraheme last week and the plan was to give her second dose of Feraheme today.  Patient received her premedications for Taxol and just after receiving few cc of Feraheme patient developed intense back pain and thigh pain as well as severe abdominal cramping, diaphoresis nausea and vomiting.  Her Feraheme was stopped and she received 10 mg of IV Compazine along with 40 mg of Solu-Medrol and 25 mg of IV Benadryl followed by resolution of her symptoms.  She will not be receiving further Feraheme at this time.  Vaginal bleeding: Self-limited and stable.  Continue to monitor   Visit Diagnosis 1. Encounter for antineoplastic chemotherapy   2. Endometrial adenocarcinoma ([place])   3. Infusion reaction, initial encounter      Dr. Randa Evens, MD, MPH [facility] at [facility] [phone] 11/29/2018 1:37 PM

## 2018-11-30 ENCOUNTER — Encounter: Payer: Medicare Other | Admitting: Obstetrics and Gynecology

## 2018-11-30 ENCOUNTER — Other Ambulatory Visit: Payer: Self-pay

## 2018-11-30 DIAGNOSIS — R600 Localized edema: Secondary | ICD-10-CM

## 2018-11-30 DIAGNOSIS — I10 Essential (primary) hypertension: Secondary | ICD-10-CM

## 2018-11-30 DIAGNOSIS — I43 Cardiomyopathy in diseases classified elsewhere: Secondary | ICD-10-CM

## 2018-11-30 DIAGNOSIS — I119 Hypertensive heart disease without heart failure: Secondary | ICD-10-CM

## 2018-11-30 DIAGNOSIS — E785 Hyperlipidemia, unspecified: Secondary | ICD-10-CM

## 2018-11-30 DIAGNOSIS — I700 Atherosclerosis of aorta: Secondary | ICD-10-CM

## 2018-11-30 DIAGNOSIS — F330 Major depressive disorder, recurrent, mild: Secondary | ICD-10-CM

## 2018-11-30 NOTE — Telephone Encounter (Signed)
Refill request was sent to Dr. Krichna Sowles for approval and submission.  

## 2018-12-01 ENCOUNTER — Telehealth: Payer: Self-pay | Admitting: Family Medicine

## 2018-12-01 ENCOUNTER — Other Ambulatory Visit: Payer: Self-pay | Admitting: Oncology

## 2018-12-01 MED ORDER — ESCITALOPRAM OXALATE 5 MG PO TABS: 5.0000 mg | ORAL_TABLET | Freq: Every day | ORAL | 0 refills | Status: DC

## 2018-12-01 MED ORDER — TRAMADOL HCL 50 MG PO TABS: 50.0000 mg | ORAL_TABLET | Freq: Four times a day (QID) | ORAL | 0 refills | Status: DC | PRN

## 2018-12-01 MED ORDER — EZETIMIBE 10 MG PO TABS: 10.0000 mg | ORAL_TABLET | Freq: Every day | ORAL | 0 refills | Status: DC

## 2018-12-01 MED ORDER — OLMESARTAN MEDOXOMIL-HCTZ 20-12.5 MG PO TABS: 1.0000 | ORAL_TABLET | Freq: Every day | ORAL | 0 refills | Status: DC

## 2018-12-01 MED ORDER — FUROSEMIDE 40 MG PO TABS: 40.0000 mg | ORAL_TABLET | Freq: Every day | ORAL | 0 refills | Status: DC | PRN

## 2018-12-01 NOTE — Telephone Encounter (Signed)
Copied from [place] [phone]. Topic: General - Other >> Dec 01, 2018  9:05 AM Keene Breath wrote: Reason for CRM: Sarah with [employer] called to get verification that the patient has taken both the furosemide (LASIX) 40 MG tablet and olmesartan-hydrochlorothiazide (BENICAR HCT) 20-12.5 MG tablet together before.  Please call back to verify.  CB# [phone]

## 2018-12-02 NOTE — Telephone Encounter (Signed)
yes

## 2018-12-02 NOTE — Telephone Encounter (Signed)
Molly [name] has been notified.

## 2018-12-07 ENCOUNTER — Telehealth: Payer: Self-pay | Admitting: *Deleted

## 2018-12-07 MED ORDER — TRAMADOL HCL 50 MG PO TABS: 50.0000 mg | ORAL_TABLET | Freq: Four times a day (QID) | ORAL | 0 refills | Status: DC | PRN

## 2018-12-07 MED ORDER — TRIAMCINOLONE ACETONIDE 0.5 % EX OINT: 1.0000 "application " | TOPICAL_OINTMENT | Freq: Two times a day (BID) | CUTANEOUS | 0 refills | Status: DC

## 2018-12-07 NOTE — Telephone Encounter (Signed)
Daughter came in today and showed dr Janese Banks the red spots on pt's head and it itches and benadryl not effective. She also did not get tramadol and when I looked [facility] They have not rcvd the med. We are refilling it today to pharmacy [facility] [address]

## 2018-12-09 ENCOUNTER — Other Ambulatory Visit: Payer: Self-pay

## 2018-12-09 NOTE — Patient Outreach (Signed)
[facility]) Care Management  [id number]  Molly [name] [id number]   Medication Adherence call to Mrs. Molly [name] spoke with patient she is due on Metformin ER 750 she said doctor took her off, she does not need it any longer. Molly [name] is showing past due under [facility].   [facility] Management Direct Dial [phone]  Fax [phone] [email or web address]

## 2018-12-20 ENCOUNTER — Inpatient Hospital Stay (HOSPITAL_BASED_OUTPATIENT_CLINIC_OR_DEPARTMENT_OTHER): Payer: Medicare Other | Admitting: Oncology

## 2018-12-20 ENCOUNTER — Inpatient Hospital Stay: Payer: Medicare Other

## 2018-12-20 VITALS — BP 122/78 | HR 76 | Temp 96.7°F | Ht 59.0 in | Wt 214.0 lb

## 2018-12-20 DIAGNOSIS — C541 Malignant neoplasm of endometrium: Secondary | ICD-10-CM | POA: Diagnosis not present

## 2018-12-20 DIAGNOSIS — R5383 Other fatigue: Secondary | ICD-10-CM

## 2018-12-20 DIAGNOSIS — M898X9 Other specified disorders of bone, unspecified site: Secondary | ICD-10-CM

## 2018-12-20 DIAGNOSIS — Z5111 Encounter for antineoplastic chemotherapy: Secondary | ICD-10-CM | POA: Diagnosis not present

## 2018-12-20 DIAGNOSIS — D509 Iron deficiency anemia, unspecified: Secondary | ICD-10-CM | POA: Diagnosis not present

## 2018-12-20 LAB — COMPREHENSIVE METABOLIC PANEL
ALK PHOS: 96 U/L (ref 38–126)
ALT: 22 U/L (ref 0–44)
AST: 23 U/L (ref 15–41)
Albumin: 4 g/dL (ref 3.5–5.0)
Anion gap: 8 (ref 5–15)
BUN: 16 mg/dL (ref 8–23)
CALCIUM: 9.1 mg/dL (ref 8.9–10.3)
CO2: 27 mmol/L (ref 22–32)
Chloride: 103 mmol/L (ref 98–111)
Creatinine, Ser: 0.64 mg/dL (ref 0.44–1.00)
GFR calc Af Amer: >60 mL/min (ref >60)
GFR calc non Af Amer: >60 mL/min (ref >60)
Glucose, Bld: 101 mg/dL — ABNORMAL HIGH (ref 70–99)
Potassium: 3.7 mmol/L (ref 3.5–5.1)
Sodium: 138 mmol/L (ref 135–145)
TOTAL PROTEIN: 7.3 g/dL (ref 6.5–8.1)
Total Bilirubin: 0.7 mg/dL (ref 0.3–1.2)

## 2018-12-20 LAB — CBC WITH DIFFERENTIAL/PLATELET
Abs Immature Granulocytes: 0.01 10*3/uL (ref 0.00–0.07)
Basophils Absolute: 0 10*3/uL (ref 0.0–0.1)
Basophils Relative: 0 %
Eosinophils Absolute: 0 10*3/uL (ref 0.0–0.5)
Eosinophils Relative: 1 %
HCT: 32.1 % — ABNORMAL LOW (ref 36.0–46.0)
Hemoglobin: 10.5 g/dL — ABNORMAL LOW (ref 12.0–15.0)
Immature Granulocytes: 0 %
Lymphocytes Relative: 33 %
Lymphs Abs: 1.6 10*3/uL (ref 0.7–4.0)
MCH: 29.9 pg (ref 26.0–34.0)
MCHC: 32.7 g/dL (ref 30.0–36.0)
MCV: 91.5 fL (ref 80.0–100.0)
MONO ABS: 0.5 10*3/uL (ref 0.1–1.0)
Monocytes Relative: 9 %
Neutro Abs: 2.8 10*3/uL (ref 1.7–7.7)
Neutrophils Relative %: 57 %
Platelets: 319 10*3/uL (ref 150–400)
RBC: 3.51 MIL/uL — ABNORMAL LOW (ref 3.87–5.11)
RDW: 15.9 % — AB (ref 11.5–15.5)
WBC: 4.9 10*3/uL (ref 4.0–10.5)
nRBC: 0 % (ref 0.0–0.2)

## 2018-12-20 MED ORDER — FAMOTIDINE IN NACL 20-0.9 MG/50ML-% IV SOLN
20.0000 mg | Freq: Once | INTRAVENOUS | Status: AC
  Administered 2018-12-20: 20 mg via INTRAVENOUS
  Filled 2018-12-20: qty 50

## 2018-12-20 MED ORDER — SODIUM CHLORIDE 0.9 % IV SOLN
Freq: Once | INTRAVENOUS | Status: AC
  Administered 2018-12-20: 09:00:00 via INTRAVENOUS
  Filled 2018-12-20: qty 250

## 2018-12-20 MED ORDER — HEPARIN SOD (PORK) LOCK FLUSH 100 UNIT/ML IV SOLN
500.0000 [IU] | Freq: Once | INTRAVENOUS | Status: AC | PRN
  Administered 2018-12-20: 500 [IU]

## 2018-12-20 MED ORDER — SODIUM CHLORIDE 0.9 % IV SOLN
540.0000 mg | Freq: Once | INTRAVENOUS | Status: AC
  Administered 2018-12-20: 540 mg via INTRAVENOUS
  Filled 2018-12-20: qty 54

## 2018-12-20 MED ORDER — DIPHENHYDRAMINE HCL 50 MG/ML IJ SOLN
50.0000 mg | Freq: Once | INTRAMUSCULAR | Status: AC
  Administered 2018-12-20: 50 mg via INTRAVENOUS
  Filled 2018-12-20: qty 1

## 2018-12-20 MED ORDER — SODIUM CHLORIDE 0.9 % IV SOLN
20.0000 mg | Freq: Once | INTRAVENOUS | Status: AC
  Administered 2018-12-20: 20 mg via INTRAVENOUS
  Filled 2018-12-20: qty 2

## 2018-12-20 MED ORDER — PALONOSETRON HCL INJECTION 0.25 MG/5ML
0.2500 mg | Freq: Once | INTRAVENOUS | Status: AC
  Administered 2018-12-20: 0.25 mg via INTRAVENOUS
  Filled 2018-12-20: qty 5

## 2018-12-20 MED ORDER — SODIUM CHLORIDE 0.9% FLUSH
10.0000 mL | INTRAVENOUS | Status: DC | PRN
  Administered 2018-12-20: 10 mL
  Filled 2018-12-20: qty 10

## 2018-12-20 MED ORDER — SODIUM CHLORIDE 0.9 % IV SOLN
175.0000 mg/m2 | Freq: Once | INTRAVENOUS | Status: AC
  Administered 2018-12-20: 348 mg via INTRAVENOUS
  Filled 2018-12-20: qty 58

## 2018-12-20 NOTE — Patient Instructions (Signed)

## 2018-12-20 NOTE — Progress Notes (Signed)
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 Marcelline Mates for cystocele and rectocele. She was noted to have postmenopausal bleeding and cramping recently. Ultrasound revealed heterogeneous appearance of uterus with fibroids invading the endometrium. She underwent an endometrial biopsy when she had a second bout of postmenopausal bleeding. Biopsy showed endometrioid adenocarcinoma FIGO grade 1. She was then seen by Dr. Fransisca Connors and plan was to proceed with laparoscopic hysterectomy and bilateral salpingo-oophorectomy along with pelvic lymph node sampling  Final pathology showed: Endometrioid carcinoma FIGO grade 2 with 33% myometrial invasion. Lymphovascular invasion present. Peritoneal/ascites fluid atypical. 2 out of 3 sentinel lymph nodes were positive for macro metastases. pT1a pN1a. FIGO IIIC1  Patient's case was discussed at tumor board and consensus was to proceed with 3 cycles of adjuvant carbotaxol followed by radiation followed by 3 more cycles of chemotherapy. Patient is already met with Dr. Donella Stade who will be giving radiation therapy to pelvic and periaortic lymph nodes and boost to her vaginal cuff. PET CT scan showed metastatic adenopathy and retroperitoneal and external iliac group of lymph nodes in the right side. No evidence of metastatic disease  elsewhere.  First cycle of chemotherapy was given on 11/05/2018   Interval history-chemotherapy well so far.  Reports mild fatigue but denies any nausea or vomiting.  Denies any tingling numbness in lower extremities.  Her appetite is good and her weight is remained stable.  ECOG PS- 1 Pain scale- 0 Opioid associated constipation- no  Review of systems- Review of Systems  Constitutional: Positive for malaise/fatigue. Negative for chills, fever and weight loss.  HENT: Negative for congestion, ear discharge and nosebleeds.   Eyes: Negative for blurred vision.  Respiratory: Negative for cough, hemoptysis, sputum production, shortness of breath and wheezing.   Cardiovascular: Negative for chest pain, palpitations, orthopnea and claudication.  Gastrointestinal: Negative for abdominal pain, blood in stool, constipation, diarrhea, heartburn, melena, nausea and vomiting.  Genitourinary: Negative for dysuria, flank pain, frequency, hematuria and urgency.  Musculoskeletal: Negative for back pain, joint pain and myalgias.  Skin: Negative for rash.  Neurological: Negative for dizziness, tingling, focal weakness, seizures, weakness and headaches.  Endo/Heme/Allergies: Does not bruise/bleed easily.  Psychiatric/Behavioral: Negative for depression and suicidal ideas. The patient does not have insomnia.      Allergies  Allergen Reactions  . Ferumoxytol Anaphylaxis  . Nsaids Hives and Rash  . Statins Other (See Comments)    Joint pains  . Victoza [Liraglutide] Other (See Comments)    pancreatitis  . Oxycodone Nausea And Vomiting  . Crestor [Rosuvastatin Calcium] Rash     Past Medical History:  Diagnosis Date  . Anxiety   . Asthma    history of asthma  . Cardiomyopathy ([place])   . Complication of anesthesia    tore hair out and made teeth rough  . Depression   . Endometrial cancer ([place]) 08/2018  . GERD (gastroesophageal reflux disease)    takes prilosec prn  . History of  CVA  (cerebrovascular accident) 12/10/2015  . Hyperlipidemia LDL goal <70 12/10/2015  . Hypertension   . Prediabetes 10/02/2017   A1c 6 in January 2018  . Stroke [facility]) 1990    no residual effects     Past Surgical History:  Procedure Laterality Date  . ABDOMINAL HYSTERECTOMY    . CHOLECYSTECTOMY    . COLONOSCOPY WITH PROPOFOL N/A 01/08/2017   Procedure: COLONOSCOPY WITH PROPOFOL;  Surgeon: [name], MD;  Location: [facility] ENDOSCOPY;  Service: Endoscopy;  Laterality: N/A;  . COLONOSCOPY WITH PROPOFOL N/A 05/05/2018   Procedure: COLONOSCOPY WITH PROPOFOL;  Surgeon: [name], MD;  Location: [facility] ENDOSCOPY;  Service: Gastroenterology;  Laterality: N/A;  . HERNIA REPAIR  [id number]   umbilical  . PORTACATH PLACEMENT N/A 11/04/2018   Procedure: INSERTION PORT-A-CATH;  Surgeon: Jules Husbands, MD;  Location: [facility] ORS;  Service: General;  Laterality: N/A;  . SENTINEL NODE BIOPSY N/A 10/06/2018   Procedure: SENTINEL NODE BIOPSY;  Surgeon: Mellody Drown, MD;  Location: [facility] ORS;  Service: Gynecology;  Laterality: N/A;  . UMBILICAL HERNIA REPAIR N/A 11/17/2017   Procedure: HERNIA REPAIR UMBILICAL ADULT;  Surgeon: Jules Husbands, MD;  Location: [facility] ORS;  Service: General;  Laterality: N/A;    Social History   Socioeconomic History  . Marital status: Divorced    Spouse name: Not on file  . Number of children: 2  . Years of education: some college  . Highest education level: 12th grade  Occupational History  . 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 Occupation: currently a Careers adviser  . Financial resource strain: Not hard at all  . Food insecurity:    Worry: Never true    Inability: Never true  . Transportation needs:    Medical: No    Non-medical: No  Tobacco Use  . Smoking status: Never Smoker  . Smokeless tobacco: Never Used  . Tobacco comment: smoking cessation materials not required  Substance and Sexual Activity  . Alcohol use: No    Alcohol/week:  0.0 standard drinks  . Drug use: No  . Sexual activity: Not Currently  Lifestyle  . Physical activity:    Days per week: 0 days    Minutes per session: 0 min  . Stress: Not at all  Relationships  . Social connections:    Talks on phone: More than three times a week    Gets together: Three times a week    Attends religious service: More than 4 times per year    Active member of club or organization: Yes    Attends meetings of clubs or organizations: More than 4 times per year    Relationship status: Divorced  . Intimate partner violence:    Fear of current or ex partner: No    Emotionally abused: No    Physically abused: No    Forced sexual activity: No  Other Topics Concern  . Not on file  Social History Narrative  . Not on file    Family History  Problem Relation Age of Onset  . Stroke Mother   . Hypertension Mother   . Dementia Mother   . Alzheimer's disease Mother   . Gout Father   . Asthma Father   . Hypertension Father   . Dementia Father   . Healthy Sister   . Stroke Brother   . Alzheimer's disease Brother   . Healthy Daughter   . Hypertension Brother   .  Healthy Brother   . Cancer Paternal Grandmother   . Healthy Sister   . Healthy Sister   . Hypertension Sister   . Hypertension Sister   . Stroke Brother   . Alzheimer's disease Brother   . Stroke Brother   . Hypertension Brother   . Stroke Brother   . Hypertension Brother   . Healthy Brother   . Healthy Brother   . Hypertension Daughter   . Breast cancer Neg Hx      Current Outpatient Medications:  .  acetaminophen (TYLENOL) 500 MG tablet, Take 2 tablets (1,000 mg total) by mouth every 6 (six) hours as needed for mild pain or moderate pain., Disp: 30 tablet, Rfl: 0 .  albuterol (PROVENTIL HFA;VENTOLIN HFA) 108 (90 Base) MCG/ACT inhaler, Inhale 2 puffs into the lungs every 4 (four) hours as needed for wheezing or shortness of breath., Disp: 1 Inhaler, Rfl: 0 .  dexamethasone (DECADRON) 4 MG  tablet, Take 2 tablets (8 mg total) by mouth daily. Start the day after chemotherapy for 2 days., Disp: 30 tablet, Rfl: 1 .  diclofenac sodium (VOLTAREN) 1 % GEL, Apply 2 g topically 3 (three) times daily as needed (pain)., Disp: 100 g, Rfl: 1 .  escitalopram (LEXAPRO) 5 MG tablet, Take 1 tablet (5 mg total) by mouth daily., Disp: 90 tablet, Rfl: 0 .  ezetimibe (ZETIA) 10 MG tablet, Take 1 tablet (10 mg total) by mouth daily., Disp: 90 tablet, Rfl: 0 .  fluticasone furoate-vilanterol (BREO ELLIPTA) 100-25 MCG/INH AEPB, Inhale 1 puff into the lungs daily., Disp: 60 each, Rfl: 0 .  furosemide (LASIX) 40 MG tablet, Take 1 tablet (40 mg total) by mouth daily as needed for fluid., Disp: 90 tablet, Rfl: 0 .  ketoconazole (NIZORAL) 2 % cream, Apply 1 application topically daily as needed for irritation. On abdominal fold prn, Disp: 60 g, Rfl: 0 .  lidocaine-prilocaine (EMLA) cream, Apply to affected area once, Disp: 30 g, Rfl: 3 .  loratadine (CLARITIN) 10 MG tablet, Take 1 tablet (10 mg total) by mouth daily., Disp: 100 tablet, Rfl: 0 .  LORazepam (ATIVAN) 0.5 MG tablet, Take 1 tablet (0.5 mg total) by mouth every 6 (six) hours as needed (Nausea or vomiting)., Disp: 30 tablet, Rfl: 0 .  olmesartan-hydrochlorothiazide (BENICAR HCT) 20-12.5 MG tablet, Take 1 tablet by mouth daily., Disp: 90 tablet, Rfl: 0 .  omeprazole (PRILOSEC OTC) 20 MG tablet, Take 20 mg by mouth daily as needed., Disp: , Rfl:  .  ondansetron (ZOFRAN) 8 MG tablet, Take 1 tablet (8 mg total) by mouth 2 (two) times daily as needed for refractory nausea / vomiting. Start on day 3 after chemo., Disp: 30 tablet, Rfl: 1 .  prochlorperazine (COMPAZINE) 10 MG tablet, Take 10 mg by mouth every 6 (six) hours as needed for nausea or vomiting., Disp: , Rfl:  .  traMADol (ULTRAM) 50 MG tablet, Take 1 tablet (50 mg total) by mouth every 6 (six) hours as needed., Disp: 15 tablet, Rfl: 0 .  triamcinolone ointment (KENALOG) 0.5 %, Apply 1 application  topically 2 (two) times daily., Disp: 30 g, Rfl: 0 No current facility-administered medications for this visit.   Facility-Administered Medications Ordered in Other Visits:  .  sodium chloride flush (NS) 0.9 % injection 10 mL, 10 mL, Intravenous, PRN, Sindy Guadeloupe, MD, 10 mL at 11/15/18 0915  Physical exam:  Vitals:   12/20/18 0841  BP: 122/78  Pulse: 76  Temp: (!) 96.7 F (35.9 C)  TempSrc: Tympanic  SpO2: 97%  Weight: 213 lb 15.3 oz (97 kg)  Height: _0  (1.499 m)   Physical Exam Constitutional:      General: She is not in acute distress.    Appearance: She is obese.  HENT:     Head: Normocephalic and atraumatic.  Eyes:     Pupils: Pupils are equal, round, and reactive to light.  Neck:     Musculoskeletal: Normal range of motion.  Cardiovascular:     Rate and Rhythm: Normal rate and regular rhythm.     Heart sounds: Normal heart sounds.  Pulmonary:     Effort: Pulmonary effort is normal.     Breath sounds: Normal breath sounds.  Abdominal:     General: Bowel sounds are normal. There is no distension.     Palpations: Abdomen is soft.     Tenderness: There is no abdominal tenderness.  Skin:    General: Skin is warm and dry.  Neurological:     Mental Status: She is alert and oriented to person, place, and time.      CMP Latest Ref Rng & Units 12/20/2018  Glucose 70 - 99 mg/dL 101(H)  BUN 8 - 23 mg/dL 16  Creatinine 0.44 - 1.00 mg/dL 0.64  Sodium 135 - 145 mmol/L 138  Potassium 3.5 - 5.1 mmol/L 3.7  Chloride 98 - 111 mmol/L 103  CO2 22 - 32 mmol/L 27  Calcium 8.9 - 10.3 mg/dL 9.1  Total Protein 6.5 - 8.1 g/dL 7.3  Total Bilirubin 0.3 - 1.2 mg/dL 0.7  Alkaline Phos 38 - 126 U/L 96  AST 15 - 41 U/L 23  ALT 0 - 44 U/L 22   CBC Latest Ref Rng & Units 12/20/2018  WBC 4.0 - 10.5 K/uL 4.9  Hemoglobin 12.0 - 15.0 g/dL 10.5(L)  Hematocrit 36.0 - 46.0 % 32.1(L)  Platelets 150 - 400 K/uL 319      Assessment and plan- Patient is a 67 y.o. female with  invasive adenocarcinoma of the endometrium endometrioid subtypeFIGO Stage IIIC2 pT1a N2 M0.  She is here for on treatment assessment prior to cycle 3 of carbotaxol chemotherapy  Counts okay to proceed with cycle 3 of carbotaxol chemotherapy.  Since the plan is to proceed with radiation treatment at this time and patient does get significant pain with on pro-Neulasta I will omit on pro-at this time.  I will see her back tentatively in 3 weeks time with CBC and CMP when she is going through her radiation treatment.  Plan is to give 3 more treatments of chemotherapy after her radiation is completed which I will coordinate with radiation oncology.  Patient did have some evidence of iron deficiency anemia and the plan was to give HER-2 doses of Feraheme.  However patient developed significant abdominal cramping and nausea vomiting during her second dose of Feraheme.  She therefore received only 1 dose.  I will plan to repeat ferritin and iron studies in about 6 weeks time   Visit Diagnosis 1. Encounter for antineoplastic chemotherapy   2. Endometrial adenocarcinoma ([place])      Dr. Randa Evens, MD, MPH [facility] at [facility] [phone] 12/22/2018 9:59 AM

## 2018-12-22 ENCOUNTER — Encounter: Payer: Self-pay | Admitting: Oncology

## 2018-12-23 ENCOUNTER — Inpatient Hospital Stay (HOSPITAL_BASED_OUTPATIENT_CLINIC_OR_DEPARTMENT_OTHER): Payer: Medicare Other | Admitting: Oncology

## 2018-12-23 ENCOUNTER — Telehealth: Payer: Self-pay

## 2018-12-23 ENCOUNTER — Other Ambulatory Visit: Payer: Self-pay | Admitting: *Deleted

## 2018-12-23 ENCOUNTER — Other Ambulatory Visit: Payer: Self-pay

## 2018-12-23 VITALS — BP 113/75 | HR 69 | Temp 97.3°F | Wt 211.7 lb

## 2018-12-23 DIAGNOSIS — C541 Malignant neoplasm of endometrium: Secondary | ICD-10-CM | POA: Diagnosis not present

## 2018-12-23 DIAGNOSIS — M25531 Pain in right wrist: Secondary | ICD-10-CM | POA: Diagnosis not present

## 2018-12-23 DIAGNOSIS — Z5111 Encounter for antineoplastic chemotherapy: Secondary | ICD-10-CM | POA: Diagnosis not present

## 2018-12-23 DIAGNOSIS — M25532 Pain in left wrist: Secondary | ICD-10-CM | POA: Diagnosis not present

## 2018-12-23 DIAGNOSIS — M79605 Pain in left leg: Secondary | ICD-10-CM

## 2018-12-23 DIAGNOSIS — R52 Pain, unspecified: Secondary | ICD-10-CM

## 2018-12-23 DIAGNOSIS — M25551 Pain in right hip: Secondary | ICD-10-CM

## 2018-12-23 DIAGNOSIS — D509 Iron deficiency anemia, unspecified: Secondary | ICD-10-CM | POA: Diagnosis not present

## 2018-12-23 DIAGNOSIS — M79604 Pain in right leg: Secondary | ICD-10-CM

## 2018-12-23 DIAGNOSIS — M25552 Pain in left hip: Secondary | ICD-10-CM

## 2018-12-23 MED ORDER — MORPHINE SULFATE 15 MG PO TABS: 15.0000 mg | ORAL_TABLET | Freq: Four times a day (QID) | ORAL | 0 refills | Status: DC | PRN

## 2018-12-23 NOTE — Telephone Encounter (Signed)
Spoke with Molly [name] who called to inform the office about her [name] which was in several pain/ whole body pain. Per her daughter she has been in several pain all night and cold to touch, no calming feeling noted. I have spoke with Molly [name] and she was to bring her mother into the office for as of today.

## 2018-12-23 NOTE — Telephone Encounter (Signed)
Received call from daughter Beau Fanny. Ms. Chacko never received Tramadol that was sent to [facility] on 12/10. She states she has went by there twice and was told that the prescription was never received. She states Ms. Bolio had a bad night with pain and is reporting pain in her wrists and ankles. Her fingers are numb. Call placed to [facility] and they have recorded that Tramadol was picked up on 12/10 and .44 cents was the paid price. I offered to have Ms. Maka seen before her next appointment with Dr. Janese Banks if her symptoms do not improve or worsen. She states she is feeling somewhat better today and will let us know. I have informed her regarding [facility]'s report of Tramadol being picked up and that if she really needs further pain medication that she can send her 5 tablets in. I am awaiting response. Oncology Nurse Navigator Documentation  Navigator Location: [facility] (12/23/18 1000)   )Navigator Encounter Type: Telephone (12/23/18 1000)                                                    Time Spent with Patient: 15 (12/23/18 1000)

## 2018-12-23 NOTE — Progress Notes (Signed)
Patient stated that she started to feel "bad" since yesterday night. Patient stated that she had alternated taking Tylenol and Tramadol. Patient stated that she started having joint pain all over her body. Patient stated that she had not been able to sleep throughout the night due to her pain.

## 2018-12-24 NOTE — Progress Notes (Signed)
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 Marcelline Mates for cystocele and rectocele. She was noted to have postmenopausal bleeding and cramping recently. Ultrasound revealed heterogeneous appearance of uterus with fibroids invading the endometrium. She underwent an endometrial biopsy when she had a second bout of postmenopausal bleeding. Biopsy showed endometrioid adenocarcinoma FIGO grade 1. She was then seen by Dr. Fransisca Connors and plan was to proceed with laparoscopic hysterectomy and bilateral salpingo-oophorectomy along with pelvic lymph node sampling  Final pathology showed: Endometrioid carcinoma FIGO grade 2 with 33% myometrial invasion. Lymphovascular invasion present. Peritoneal/ascites fluid atypical. 2 out of 3 sentinel lymph nodes were positive for macro metastases. pT1a pN1a. FIGO IIIC1  Patient's case was discussed at tumor board and consensus was to proceed with 3 cycles of adjuvant carbotaxol followed by radiation followed by 3 more cycles of chemotherapy. Patient is already met with Dr. Donella Stade who will be giving radiation therapy to pelvic and periaortic lymph nodes and boost to her vaginal cuff. PET CT scan showed metastatic adenopathy and retroperitoneal and external iliac group of lymph nodes in the right side. No evidence of metastatic  disease elsewhere.  First cycle of chemotherapy was given on 11/05/2018   Interval history-patient reports having intense pain all over her body but especially in her hips bilateral legs and wrists after receiving cycle 3 of carbotaxol chemotherapy 01/17/2022.  She did not receive ongoing Neulasta at this time.  She has been trying to use her tramadol over the last couple of days but states that it has not been helping her much  ECOG PS- 1 Pain scale- 4 Opioid associated constipation- no  Review of systems- Review of Systems  Constitutional: Negative for chills, fever, malaise/fatigue and weight loss.  HENT: Negative for congestion, ear discharge and nosebleeds.   Eyes: Negative for blurred vision.  Respiratory: Negative for cough, hemoptysis, sputum production, shortness of breath and wheezing.   Cardiovascular: Negative for chest pain, palpitations, orthopnea and claudication.  Gastrointestinal: Negative for abdominal pain, blood in stool, constipation, diarrhea, heartburn, melena, nausea and vomiting.  Genitourinary: Negative for dysuria, flank pain, frequency, hematuria and urgency.  Musculoskeletal: Positive for joint pain. Negative for back pain and myalgias.       Diffuse bodyaches  Skin: Negative for rash.  Neurological: Negative for dizziness, tingling, focal weakness, seizures, weakness and headaches.  Endo/Heme/Allergies: Does not bruise/bleed easily.  Psychiatric/Behavioral: Negative for depression and suicidal ideas. The patient does not have insomnia.      Allergies  Allergen Reactions  . Ferumoxytol Anaphylaxis  . Nsaids Hives and Rash  . Statins Other (See Comments)    Joint pains  . Victoza [Liraglutide] Other (See Comments)    pancreatitis  . Oxycodone Nausea And Vomiting  . Crestor [Rosuvastatin Calcium] Rash     Past Medical History:  Diagnosis Date  . Anxiety   . Asthma    history of asthma  . Cardiomyopathy ([place])   . Complication  of anesthesia     tore hair out and made teeth rough  . Depression   . Endometrial cancer ([place]) 08/2018  . GERD (gastroesophageal reflux disease)    takes prilosec prn  . History of CVA (cerebrovascular accident) 12/10/2015  . Hyperlipidemia LDL goal <70 12/10/2015  . Hypertension   . Prediabetes 10/02/2017   A1c 6 in January 2018  . Stroke [facility]) 1990    no residual effects     Past Surgical History:  Procedure Laterality Date  . ABDOMINAL HYSTERECTOMY    . CHOLECYSTECTOMY    . COLONOSCOPY WITH PROPOFOL N/A 01/08/2017   Procedure: COLONOSCOPY WITH PROPOFOL;  Surgeon: [name], MD;  Location: [facility] ENDOSCOPY;  Service: Endoscopy;  Laterality: N/A;  . COLONOSCOPY WITH PROPOFOL N/A 05/05/2018   Procedure: COLONOSCOPY WITH PROPOFOL;  Surgeon: [name], MD;  Location: [facility] ENDOSCOPY;  Service: Gastroenterology;  Laterality: N/A;  . HERNIA REPAIR  [id number]   umbilical  . PORTACATH PLACEMENT N/A 11/04/2018   Procedure: INSERTION PORT-A-CATH;  Surgeon: Jules Husbands, MD;  Location: [facility] ORS;  Service: General;  Laterality: N/A;  . SENTINEL NODE BIOPSY N/A 10/06/2018   Procedure: SENTINEL NODE BIOPSY;  Surgeon: Mellody Drown, MD;  Location: [facility] ORS;  Service: Gynecology;  Laterality: N/A;  . UMBILICAL HERNIA REPAIR N/A 11/17/2017   Procedure: HERNIA REPAIR UMBILICAL ADULT;  Surgeon: Jules Husbands, MD;  Location: [facility] ORS;  Service: General;  Laterality: N/A;    Social History   Socioeconomic History  . Marital status: Divorced    Spouse name: Not on file  . Number of children: 2  . Years of education: some college  . Highest education level: 12th grade  Occupational History  . 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 Occupation: currently a Careers adviser  . Financial resource strain: Not hard at all  . Food insecurity:    Worry: Never true    Inability: Never true  . Transportation needs:    Medical: No    Non-medical: No  Tobacco Use  . Smoking status: Never  Smoker  . Smokeless tobacco: Never Used  . Tobacco comment: smoking cessation materials not required  Substance and Sexual Activity  . Alcohol use: No    Alcohol/week: 0.0 standard drinks  . Drug use: No  . Sexual activity: Not Currently  Lifestyle  . Physical activity:    Days per week: 0 days    Minutes per session: 0 min  . Stress: Not at all  Relationships  . Social connections:    Talks on phone: More than three times a week    Gets together: Three times a week    Attends religious service: More than 4 times per year    Active member of club or organization: Yes    Attends meetings of clubs or organizations: More than 4 times per year    Relationship status: Divorced  . Intimate partner violence:    Fear of current or ex partner: No    Emotionally abused: No    Physically abused: No    Forced sexual activity: No  Other Topics Concern  . Not on file  Social History Narrative  . Not on file    Family History  Problem Relation Age of Onset  . Stroke Mother   . Hypertension Mother   . Dementia Mother   . Alzheimer's disease Mother   . Gout Father   . Asthma Father   .  Hypertension Father   . Dementia Father   . Healthy Sister   . Stroke Brother   . Alzheimer's disease Brother   . Healthy Daughter   . Hypertension Brother   . Healthy Brother   . Cancer Paternal Grandmother   . Healthy Sister   . Healthy Sister   . Hypertension Sister   . Hypertension Sister   . Stroke Brother   . Alzheimer's disease Brother   . Stroke Brother   . Hypertension Brother   . Stroke Brother   . Hypertension Brother   . Healthy Brother   . Healthy Brother   . Hypertension Daughter   . Breast cancer Neg Hx      Current Outpatient Medications:  .  acetaminophen (TYLENOL) 500 MG tablet, Take 2 tablets (1,000 mg total) by mouth every 6 (six) hours as needed for mild pain or moderate pain., Disp: 30 tablet, Rfl: 0 .  albuterol (PROVENTIL HFA;VENTOLIN HFA) 108 (90 Base) MCG/ACT  inhaler, Inhale 2 puffs into the lungs every 4 (four) hours as needed for wheezing or shortness of breath., Disp: 1 Inhaler, Rfl: 0 .  dexamethasone (DECADRON) 4 MG tablet, Take 2 tablets (8 mg total) by mouth daily. Start the day after chemotherapy for 2 days., Disp: 30 tablet, Rfl: 1 .  diclofenac sodium (VOLTAREN) 1 % GEL, Apply 2 g topically 3 (three) times daily as needed (pain)., Disp: 100 g, Rfl: 1 .  escitalopram (LEXAPRO) 5 MG tablet, Take 1 tablet (5 mg total) by mouth daily., Disp: 90 tablet, Rfl: 0 .  ezetimibe (ZETIA) 10 MG tablet, Take 1 tablet (10 mg total) by mouth daily., Disp: 90 tablet, Rfl: 0 .  fluticasone furoate-vilanterol (BREO ELLIPTA) 100-25 MCG/INH AEPB, Inhale 1 puff into the lungs daily., Disp: 60 each, Rfl: 0 .  furosemide (LASIX) 40 MG tablet, Take 1 tablet (40 mg total) by mouth daily as needed for fluid., Disp: 90 tablet, Rfl: 0 .  ketoconazole (NIZORAL) 2 % cream, Apply 1 application topically daily as needed for irritation. On abdominal fold prn, Disp: 60 g, Rfl: 0 .  lidocaine-prilocaine (EMLA) cream, Apply to affected area once, Disp: 30 g, Rfl: 3 .  loratadine (CLARITIN) 10 MG tablet, Take 1 tablet (10 mg total) by mouth daily., Disp: 100 tablet, Rfl: 0 .  LORazepam (ATIVAN) 0.5 MG tablet, Take 1 tablet (0.5 mg total) by mouth every 6 (six) hours as needed (Nausea or vomiting)., Disp: 30 tablet, Rfl: 0 .  olmesartan-hydrochlorothiazide (BENICAR HCT) 20-12.5 MG tablet, Take 1 tablet by mouth daily., Disp: 90 tablet, Rfl: 0 .  omeprazole (PRILOSEC OTC) 20 MG tablet, Take 20 mg by mouth daily as needed., Disp: , Rfl:  .  ondansetron (ZOFRAN) 8 MG tablet, Take 1 tablet (8 mg total) by mouth 2 (two) times daily as needed for refractory nausea / vomiting. Start on day 3 after chemo., Disp: 30 tablet, Rfl: 1 .  prochlorperazine (COMPAZINE) 10 MG tablet, Take 10 mg by mouth every 6 (six) hours as needed for nausea or vomiting., Disp: , Rfl:  .  traMADol (ULTRAM) 50 MG  tablet, Take 1 tablet (50 mg total) by mouth every 6 (six) hours as needed., Disp: 15 tablet, Rfl: 0 .  triamcinolone ointment (KENALOG) 0.5 %, Apply 1 application topically 2 (two) times daily., Disp: 30 g, Rfl: 0 .  morphine (MSIR) 15 MG tablet, Take 1 tablet (15 mg total) by mouth every 6 (six) hours as needed for severe pain., Disp: 20 tablet,  Rfl: 0 No current facility-administered medications for this visit.   Facility-Administered Medications Ordered in Other Visits:  .  sodium chloride flush (NS) 0.9 % injection 10 mL, 10 mL, Intravenous, PRN, Sindy Guadeloupe, MD, 10 mL at 11/15/18 0915  Physical exam:  Vitals:   12/23/18 1551  BP: 113/75  Pulse: 69  Temp: (!) 97.3 F (36.3 C)  TempSrc: Tympanic  Weight: 211 lb 11.2 oz (96 kg)   Physical Exam Constitutional:      Appearance: She is obese.     Comments: Appears in mild distress from pain  HENT:     Head: Normocephalic and atraumatic.  Eyes:     Pupils: Pupils are equal, round, and reactive to light.  Neck:     Musculoskeletal: Normal range of motion.  Cardiovascular:     Rate and Rhythm: Normal rate and regular rhythm.     Heart sounds: Normal heart sounds.  Pulmonary:     Effort: Pulmonary effort is normal.     Breath sounds: Normal breath sounds.  Abdominal:     General: Bowel sounds are normal.     Palpations: Abdomen is soft.  Musculoskeletal:        General: No swelling or deformity.     Right lower leg: No edema.     Left lower leg: No edema.  Skin:    General: Skin is warm and dry.  Neurological:     Mental Status: She is alert and oriented to person, place, and time.      CMP Latest Ref Rng & Units 12/20/2018  Glucose 70 - 99 mg/dL 101(H)  BUN 8 - 23 mg/dL 16  Creatinine 0.44 - 1.00 mg/dL 0.64  Sodium 135 - 145 mmol/L 138  Potassium 3.5 - 5.1 mmol/L 3.7  Chloride 98 - 111 mmol/L 103  CO2 22 - 32 mmol/L 27  Calcium 8.9 - 10.3 mg/dL 9.1  Total Protein 6.5 - 8.1 g/dL 7.3  Total Bilirubin 0.3 - 1.2  mg/dL 0.7  Alkaline Phos 38 - 126 U/L 96  AST 15 - 41 U/L 23  ALT 0 - 44 U/L 22   CBC Latest Ref Rng & Units 12/20/2018  WBC 4.0 - 10.5 K/uL 4.9  Hemoglobin 12.0 - 15.0 g/dL 10.5(L)  Hematocrit 36.0 - 46.0 % 32.1(L)  Platelets 150 - 400 K/uL 319     Assessment and plan- Patient is a 67 y.o. female  with invasive adenocarcinoma of the endometrium endometrioid subtypeFIGO Stage IIIC2 pT1a N2 M0.   Is s/p 3 cycles of carbotaxol chemotherapy and here for an acute sick visit after having diffuse body aches and joint pain after receiving third cycle of carbotaxol chemotherapy on 11/30/2018  Patient has been using her home tramadol but has not had any significant improvement in her pain.  She may have Taxol-induced acute pain syndrome which is more of a neuropathic pain but can present as diffuse body aches and joint pains after chemotherapy.  She did not receive Neulasta at that point explain her pain.  I have given her a short course of morphine 15 mg every 6 hours as needed for 5 days. Could consider effexor if her pain does not improve. I would not be in favor of prescribing long term narcotics for this  Patient will now be proceeding for her radiation treatment and will not be starting chemotherapy for the next few weeks and hopefully her symptoms should improve in due course   Visit Diagnosis 1. Acute pain  Dr. Randa Evens, MD, MPH [facility] [phone] 12/24/2018 12:45 PM

## 2018-12-28 ENCOUNTER — Encounter: Payer: Self-pay | Admitting: Radiation Oncology

## 2018-12-28 ENCOUNTER — Ambulatory Visit
Admission: RE | Admit: 2018-12-28 | Discharge: 2018-12-28 | Disposition: A | Discharge status: Home / Self Care | Payer: Medicare Other | Source: Ambulatory Visit | Attending: Radiation Oncology | Admitting: Radiation Oncology

## 2018-12-28 ENCOUNTER — Other Ambulatory Visit: Payer: Self-pay

## 2018-12-28 VITALS — BP 121/69 | HR 73 | Temp 96.9°F | Resp 18 | Wt 213.7 lb

## 2018-12-28 DIAGNOSIS — Z9221 Personal history of antineoplastic chemotherapy: Secondary | ICD-10-CM | POA: Insufficient documentation

## 2018-12-28 DIAGNOSIS — Z90722 Acquired absence of ovaries, bilateral: Secondary | ICD-10-CM | POA: Diagnosis not present

## 2018-12-28 DIAGNOSIS — C786 Secondary malignant neoplasm of retroperitoneum and peritoneum: Secondary | ICD-10-CM | POA: Insufficient documentation

## 2018-12-28 DIAGNOSIS — C779 Secondary and unspecified malignant neoplasm of lymph node, unspecified: Secondary | ICD-10-CM | POA: Diagnosis not present

## 2018-12-28 DIAGNOSIS — M2550 Pain in unspecified joint: Secondary | ICD-10-CM | POA: Diagnosis not present

## 2018-12-28 DIAGNOSIS — Z90710 Acquired absence of both cervix and uterus: Secondary | ICD-10-CM | POA: Diagnosis not present

## 2018-12-28 DIAGNOSIS — C541 Malignant neoplasm of endometrium: Secondary | ICD-10-CM

## 2018-12-28 NOTE — Progress Notes (Signed)
Radiation Oncology Follow up Note  Name: Molly [name]   Date:   12/28/2018 MRN:  [id number] DOB: Feb 17, 1951    This 67 y.o. female presents to the clinic today for evaluations to start pelvic and aortic radiation therapy in patient with stage IIIc (T1 N2 M0) endometrial carcinoma status post TAH/BSO and adjuvant chemotherapy.  REFERRING PROVIDER: Steele Sizer, MD  HPI: patient is a 67 year old female originally consult back in October 2019 status post TAH/BSOfor stage IIIc endometrial adenocarcinoma.Marland KitchenPET CT had demonstrated metastatic adenopathy in the para-aortic retroperitoneum as well as right external iliac chain. She is undergone systemic chemotherapy consisting of 3 cycles of carbotaxol which is tolerated fairly well. She did have a hard time with fatigue and abdominal complaints on her third cycle. Tumor initially showed 33% myometrial invasion as well as lymphovascular invasion. 2 out of 3 sentinel lymph nodes were positive macro metastatic disease.patient is currently on tramadol for joint pain. She specifically denies vaginal discharge or significant increased lower urinary tract symptoms or diarrhea.  COMPLICATIONS OF TREATMENT: none  FOLLOW UP COMPLIANCE: keeps appointments   PHYSICAL EXAM:  BP 121/69   Pulse 73   Temp (!) 96.9 F (36.1 C)   Resp 18   Wt 213 lb 11.8 oz (97 kg)   BMI 43.17 kg/m  Well-developed well-nourished patient in NAD. HEENT reveals PERLA, EOMI, discs not visualized.  Oral cavity is clear. No oral mucosal lesions are identified. Neck is clear without evidence of cervical or supraclavicular adenopathy. Lungs are clear to A&P. Cardiac examination is essentially unremarkable with regular rate and rhythm without murmur rub or thrill. Abdomen is benign with no organomegaly or masses noted. Motor sensory and DTR levels are equal and symmetric in the upper and lower extremities. Cranial nerves II through XII are grossly intact. Proprioception is intact. No  peripheral adenopathy or edema is identified. No motor or sensory levels are noted. Crude visual fields are within normal range.  RADIOLOGY RESULTS: PET CT scan is reviewed and compatible with the above-stated findings  PLAN: at this time I like to go ahead with both pelvic and para-aortic radiation therapy. I would use I am RT treatment planning on her pelvic fields to deliver 4500 cGy to her pelvis treating the hypermetabolic nodes on original PET CT scan to 5000 cGy all over 5 weeks.I also treat her periaortic nodes again to 4500 cGy in 5 weeks. Based on extremely large field encompassing both periaortic nodes and pelvic nodes will split that up and treated in 2 separate courses of treatment. I would also like to do vaginal brachytherapy after completion of her treatments based on her lymphovascular invasion and high likelihood of vaginal apex recurrence. Patient and daughters both comprehend my treatment plan well. I have personally set up and ordered CT simulation for next week. Patient also will have chemotherapy following my radiation therapy as her treatment plan.There will be extra effort by both professional staff as well as technical staff to coordinate and manage concurrent chemoradiation and ensuing side effects during her treatments.  I would like to take this opportunity to thank you for allowing me to participate in the care of your patient.Noreene Filbert, MD

## 2019-01-06 ENCOUNTER — Ambulatory Visit
Admission: RE | Admit: 2019-01-06 | Discharge: 2019-01-06 | Disposition: A | Discharge status: Home / Self Care | Payer: Medicare Other | Source: Ambulatory Visit | Attending: Radiation Oncology | Admitting: Radiation Oncology

## 2019-01-06 DIAGNOSIS — Z510 Encounter for antineoplastic radiation therapy: Secondary | ICD-10-CM | POA: Insufficient documentation

## 2019-01-06 DIAGNOSIS — C541 Malignant neoplasm of endometrium: Secondary | ICD-10-CM | POA: Insufficient documentation

## 2019-01-07 ENCOUNTER — Other Ambulatory Visit: Payer: Self-pay | Admitting: Family Medicine

## 2019-01-10 ENCOUNTER — Inpatient Hospital Stay: Payer: Medicare Other

## 2019-01-10 ENCOUNTER — Encounter: Payer: Self-pay | Admitting: Oncology

## 2019-01-10 ENCOUNTER — Inpatient Hospital Stay: Payer: Medicare Other | Attending: Oncology | Admitting: Oncology

## 2019-01-10 VITALS — BP 123/73 | HR 59 | Temp 97.3°F | Resp 18 | Wt 215.6 lb

## 2019-01-10 DIAGNOSIS — Z510 Encounter for antineoplastic radiation therapy: Secondary | ICD-10-CM | POA: Diagnosis not present

## 2019-01-10 DIAGNOSIS — E860 Dehydration: Secondary | ICD-10-CM | POA: Diagnosis not present

## 2019-01-10 DIAGNOSIS — E876 Hypokalemia: Secondary | ICD-10-CM | POA: Diagnosis not present

## 2019-01-10 DIAGNOSIS — G620 Drug-induced polyneuropathy: Secondary | ICD-10-CM | POA: Diagnosis not present

## 2019-01-10 DIAGNOSIS — T451X5A Adverse effect of antineoplastic and immunosuppressive drugs, initial encounter: Secondary | ICD-10-CM

## 2019-01-10 DIAGNOSIS — D638 Anemia in other chronic diseases classified elsewhere: Secondary | ICD-10-CM | POA: Diagnosis not present

## 2019-01-10 DIAGNOSIS — R112 Nausea with vomiting, unspecified: Secondary | ICD-10-CM | POA: Diagnosis not present

## 2019-01-10 DIAGNOSIS — D709 Neutropenia, unspecified: Secondary | ICD-10-CM | POA: Insufficient documentation

## 2019-01-10 DIAGNOSIS — Z5111 Encounter for antineoplastic chemotherapy: Secondary | ICD-10-CM | POA: Diagnosis not present

## 2019-01-10 DIAGNOSIS — C541 Malignant neoplasm of endometrium: Secondary | ICD-10-CM

## 2019-01-10 LAB — CBC WITH DIFFERENTIAL/PLATELET
Abs Immature Granulocytes: 0.02 10*3/uL (ref 0.00–0.07)
BASOS ABS: 0 10*3/uL (ref 0.0–0.1)
Basophils Relative: 0 %
Eosinophils Absolute: 0 10*3/uL (ref 0.0–0.5)
Eosinophils Relative: 1 %
HCT: 32.5 % — ABNORMAL LOW (ref 36.0–46.0)
Hemoglobin: 10.7 g/dL — ABNORMAL LOW (ref 12.0–15.0)
Immature Granulocytes: 0 %
Lymphocytes Relative: 33 %
Lymphs Abs: 1.7 10*3/uL (ref 0.7–4.0)
MCH: 30.2 pg (ref 26.0–34.0)
MCHC: 32.9 g/dL (ref 30.0–36.0)
MCV: 91.8 fL (ref 80.0–100.0)
Monocytes Absolute: 0.5 10*3/uL (ref 0.1–1.0)
Monocytes Relative: 9 %
NRBC: 0 % (ref 0.0–0.2)
Neutro Abs: 3 10*3/uL (ref 1.7–7.7)
Neutrophils Relative %: 57 %
Platelets: 219 10*3/uL (ref 150–400)
RBC: 3.54 MIL/uL — ABNORMAL LOW (ref 3.87–5.11)
RDW: 15.3 % (ref 11.5–15.5)
WBC: 5.3 10*3/uL (ref 4.0–10.5)

## 2019-01-10 LAB — COMPREHENSIVE METABOLIC PANEL
ALT: 15 U/L (ref 0–44)
AST: 19 U/L (ref 15–41)
Albumin: 3.9 g/dL (ref 3.5–5.0)
Alkaline Phosphatase: 82 U/L (ref 38–126)
Anion gap: 9 (ref 5–15)
BUN: 9 mg/dL (ref 8–23)
CO2: 29 mmol/L (ref 22–32)
Calcium: 9.2 mg/dL (ref 8.9–10.3)
Chloride: 104 mmol/L (ref 98–111)
Creatinine, Ser: 0.71 mg/dL (ref 0.44–1.00)
GFR calc Af Amer: >60 mL/min (ref >60)
GFR calc non Af Amer: >60 mL/min (ref >60)
Glucose, Bld: 109 mg/dL — ABNORMAL HIGH (ref 70–99)
Potassium: 3.7 mmol/L (ref 3.5–5.1)
Sodium: 142 mmol/L (ref 135–145)
Total Bilirubin: 0.5 mg/dL (ref 0.3–1.2)
Total Protein: 7.2 g/dL (ref 6.5–8.1)

## 2019-01-10 NOTE — Progress Notes (Signed)
Noticed joint pains for last 2 weeks after last chemotherapy. Morphine helping. Appetite is good. Energy level is low. No chemo tx planned for today.

## 2019-01-10 NOTE — Progress Notes (Signed)
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 Marcelline Mates for cystocele and rectocele. She was noted to have postmenopausal bleeding and cramping recently. Ultrasound revealed heterogeneous appearance of uterus with fibroids invading the endometrium. She underwent an endometrial biopsy when she had a second bout of postmenopausal bleeding. Biopsy showed endometrioid adenocarcinoma FIGO grade 1. She was then seen by Dr. Fransisca Connors and plan was to proceed with laparoscopic hysterectomy and bilateral salpingo-oophorectomy along with pelvic lymph node sampling  Final pathology showed: Endometrioid carcinoma FIGO grade 2 with 33% myometrial invasion. Lymphovascular invasion present. Peritoneal/ascites fluid atypical. 2 out of 3 sentinel lymph nodes were positive for macro metastases. pT1a pN1a. FIGO IIIC1  Patient's case was discussed at tumor board and consensus was to proceed with 3 cycles of adjuvant carbotaxol followed by radiation followed by 3 more cycles of chemotherapy. Patient is already met with Dr. Donella Stade who will be giving radiation therapy to pelvic and periaortic lymph nodes and boost to her vaginal cuff. PET CT scan showed metastatic adenopathy and retroperitoneal and external iliac group of lymph nodes in the right side. No evidence of metastatic disease elsewhere.  First cycle of  chemotherapy was given on 11/05/2018    Interval history-patient will start her radiation treatment tomorrow and plans to get 5 weeks initially followed by a break of 1 week followed by another 5 weeks to her para-aortic lymph nodes.  She reports doing well overall.  Her appetite is good and she denies any nausea vomiting.  She has mild tingling numbness in her bilateral fingertips which is essentially stable.  Her body aches have improved  ECOG PS- 1 Pain scale- 0 Opioid associated constipation- no  Review of systems- Review of Systems  Constitutional: Positive for malaise/fatigue. Negative for chills, fever and weight loss.  HENT: Negative for congestion, ear discharge and nosebleeds.   Eyes: Negative for blurred vision.  Respiratory: Negative for cough, hemoptysis, sputum production, shortness of breath and wheezing.   Cardiovascular: Negative for chest pain, palpitations, orthopnea and claudication.  Gastrointestinal: Negative for abdominal pain, blood in stool, constipation, diarrhea, heartburn, melena, nausea and vomiting.  Genitourinary: Negative for dysuria, flank pain, frequency, hematuria and urgency.  Musculoskeletal: Positive for myalgias. Negative for back pain and joint pain.  Skin: Negative for rash.  Neurological: Positive for sensory change (Peripheral neuropathy). Negative for dizziness, tingling, focal weakness, seizures, weakness and headaches.  Endo/Heme/Allergies: Does not bruise/bleed easily.  Psychiatric/Behavioral: Negative for depression and suicidal ideas. The patient does not have insomnia.       Allergies  Allergen Reactions  . Ferumoxytol Anaphylaxis  . Nsaids Hives and Rash  . Statins Other (See Comments)    Joint pains  . Victoza [Liraglutide] Other (See Comments)    pancreatitis  . Oxycodone Nausea And Vomiting  . Crestor [Rosuvastatin Calcium] Rash     Past Medical History:  Diagnosis Date  . Anxiety   . Asthma    history of asthma  .  Cardiomyopathy (  [place])   . Complication of anesthesia    tore hair out and made teeth rough  . Depression   . Endometrial cancer ([place]) 08/2018  . GERD (gastroesophageal reflux disease)    takes prilosec prn  . History of CVA (cerebrovascular accident) 12/10/2015  . Hyperlipidemia LDL goal <70 12/10/2015  . Hypertension   . Prediabetes 10/02/2017   A1c 6 in January 2018  . Stroke [facility]) 1990    no residual effects     Past Surgical History:  Procedure Laterality Date  . ABDOMINAL HYSTERECTOMY    . CHOLECYSTECTOMY    . COLONOSCOPY WITH PROPOFOL N/A 01/08/2017   Procedure: COLONOSCOPY WITH PROPOFOL;  Surgeon: [name], MD;  Location: [facility] ENDOSCOPY;  Service: Endoscopy;  Laterality: N/A;  . COLONOSCOPY WITH PROPOFOL N/A 05/05/2018   Procedure: COLONOSCOPY WITH PROPOFOL;  Surgeon: [name], MD;  Location: [facility] ENDOSCOPY;  Service: Gastroenterology;  Laterality: N/A;  . HERNIA REPAIR  92/0100   umbilical  . PORTACATH PLACEMENT N/A 11/04/2018   Procedure: INSERTION PORT-A-CATH;  Surgeon: Jules Husbands, MD;  Location: [facility] ORS;  Service: General;  Laterality: N/A;  . SENTINEL NODE BIOPSY N/A 10/06/2018   Procedure: SENTINEL NODE BIOPSY;  Surgeon: Mellody Drown, MD;  Location: [facility] ORS;  Service: Gynecology;  Laterality: N/A;  . UMBILICAL HERNIA REPAIR N/A 11/17/2017   Procedure: HERNIA REPAIR UMBILICAL ADULT;  Surgeon: Jules Husbands, MD;  Location: [facility] ORS;  Service: General;  Laterality: N/A;    Social History   Socioeconomic History  . Marital status: Divorced    Spouse name: Not on file  . Number of children: 2  . Years of education: some college  . Highest education level: 12th grade  Occupational History  . 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 Occupation: currently a Careers adviser  . Financial resource strain: Not hard at all  . Food insecurity:    Worry: Never true    Inability: Never true  . Transportation needs:    Medical: No     Non-medical: No  Tobacco Use  . Smoking status: Never Smoker  . Smokeless tobacco: Never Used  . Tobacco comment: smoking cessation materials not required  Substance and Sexual Activity  . Alcohol use: No    Alcohol/week: 0.0 standard drinks  . Drug use: No  . Sexual activity: Not Currently  Lifestyle  . Physical activity:    Days per week: 0 days    Minutes per session: 0 min  . Stress: Not at all  Relationships  . Social connections:    Talks on phone: More than three times a week    Gets together: Three times a week    Attends religious service: More than 4 times per year    Active member of club or organization: Yes    Attends meetings of clubs or organizations: More than 4 times per year    Relationship status: Divorced  . Intimate partner violence:    Fear of current or ex partner: No    Emotionally abused: No    Physically abused: No    Forced sexual activity: No  Other Topics Concern  . Not on file  Social History Narrative  . Not on file    Family History  Problem Relation Age of Onset  . Stroke Mother   . Hypertension Mother   . Dementia Mother   . Alzheimer's disease Mother   . Gout Father   .  Asthma Father   . Hypertension Father   . Dementia Father   . Healthy Sister   . Stroke Brother   . Alzheimer's disease Brother   . Healthy Daughter   . Hypertension Brother   . Healthy Brother   . Cancer Paternal Grandmother   . Healthy Sister   . Healthy Sister   . Hypertension Sister   . Hypertension Sister   . Stroke Brother   . Alzheimer's disease Brother   . Stroke Brother   . Hypertension Brother   . Stroke Brother   . Hypertension Brother   . Healthy Brother   . Healthy Brother   . Hypertension Daughter   . Breast cancer Neg Hx      Current Outpatient Medications:  .  acetaminophen (TYLENOL) 500 MG tablet, Take 2 tablets (1,000 mg total) by mouth every 6 (six) hours as needed for mild pain or moderate pain., Disp: 30 tablet, Rfl: 0 .   albuterol (PROVENTIL HFA;VENTOLIN HFA) 108 (90 Base) MCG/ACT inhaler, Inhale 2 puffs into the lungs every 4 (four) hours as needed for wheezing or shortness of breath., Disp: 1 Inhaler, Rfl: 0 .  dexamethasone (DECADRON) 4 MG tablet, Take 2 tablets (8 mg total) by mouth daily. Start the day after chemotherapy for 2 days., Disp: 30 tablet, Rfl: 1 .  diclofenac sodium (VOLTAREN) 1 % GEL, Apply 2 g topically 3 (three) times daily as needed (pain)., Disp: 100 g, Rfl: 1 .  escitalopram (LEXAPRO) 5 MG tablet, Take 1 tablet (5 mg total) by mouth daily., Disp: 90 tablet, Rfl: 0 .  ezetimibe (ZETIA) 10 MG tablet, Take 1 tablet (10 mg total) by mouth daily., Disp: 90 tablet, Rfl: 0 .  fluticasone furoate-vilanterol (BREO ELLIPTA) 100-25 MCG/INH AEPB, Inhale 1 puff into the lungs daily., Disp: 60 each, Rfl: 0 .  furosemide (LASIX) 40 MG tablet, Take 1 tablet (40 mg total) by mouth daily as needed for fluid., Disp: 90 tablet, Rfl: 0 .  ketoconazole (NIZORAL) 2 % cream, Apply 1 application topically daily as needed for irritation. On abdominal fold prn, Disp: 60 g, Rfl: 0 .  lidocaine-prilocaine (EMLA) cream, Apply to affected area once, Disp: 30 g, Rfl: 3 .  loratadine (CLARITIN) 10 MG tablet, Take 1 tablet by mouth daily., Disp: 30 tablet, Rfl: 5 .  LORazepam (ATIVAN) 0.5 MG tablet, Take 1 tablet (0.5 mg total) by mouth every 6 (six) hours as needed (Nausea or vomiting)., Disp: 30 tablet, Rfl: 0 .  morphine (MSIR) 15 MG tablet, Take 1 tablet (15 mg total) by mouth every 6 (six) hours as needed for severe pain., Disp: 20 tablet, Rfl: 0 .  olmesartan-hydrochlorothiazide (BENICAR HCT) 20-12.5 MG tablet, Take 1 tablet by mouth daily., Disp: 90 tablet, Rfl: 0 .  omeprazole (PRILOSEC OTC) 20 MG tablet, Take 20 mg by mouth daily as needed., Disp: , Rfl:  .  prochlorperazine (COMPAZINE) 10 MG tablet, Take 10 mg by mouth every 6 (six) hours as needed for nausea or vomiting., Disp: , Rfl:  .  traMADol (ULTRAM) 50 MG  tablet, Take 1 tablet (50 mg total) by mouth every 6 (six) hours as needed., Disp: 15 tablet, Rfl: 0 .  triamcinolone ointment (KENALOG) 0.5 %, Apply 1 application topically 2 (two) times daily., Disp: 30 g, Rfl: 0 .  ondansetron (ZOFRAN) 8 MG tablet, Take 1 tablet (8 mg total) by mouth 2 (two) times daily as needed for refractory nausea / vomiting. Start on day 3 after chemo., Disp:  30 tablet, Rfl: 1 No current facility-administered medications for this visit.   Facility-Administered Medications Ordered in Other Visits:  .  sodium chloride flush (NS) 0.9 % injection 10 mL, 10 mL, Intravenous, PRN, Sindy Guadeloupe, MD, 10 mL at 11/15/18 0915  Physical exam:  Vitals:   01/10/19 1035  BP: 123/73  Pulse: (!) 59  Resp: 18  Temp: (!) 97.3 F (36.3 C)  Weight: 215 lb 9.6 oz (97.8 kg)   Physical Exam Constitutional:      Appearance: She is obese.  HENT:     Head: Normocephalic and atraumatic.  Eyes:     Pupils: Pupils are equal, round, and reactive to light.  Neck:     Musculoskeletal: Normal range of motion.  Cardiovascular:     Rate and Rhythm: Normal rate and regular rhythm.     Heart sounds: Normal heart sounds.  Pulmonary:     Effort: Pulmonary effort is normal.     Breath sounds: Normal breath sounds.  Abdominal:     General: Bowel sounds are normal.     Palpations: Abdomen is soft.  Skin:    General: Skin is warm and dry.  Neurological:     Mental Status: She is alert and oriented to person, place, and time.      CMP Latest Ref Rng & Units 01/10/2019  Glucose 70 - 99 mg/dL 109(H)  BUN 8 - 23 mg/dL 9  Creatinine 0.44 - 1.00 mg/dL 0.71  Sodium 135 - 145 mmol/L 142  Potassium 3.5 - 5.1 mmol/L 3.7  Chloride 98 - 111 mmol/L 104  CO2 22 - 32 mmol/L 29  Calcium 8.9 - 10.3 mg/dL 9.2  Total Protein 6.5 - 8.1 g/dL 7.2  Total Bilirubin 0.3 - 1.2 mg/dL 0.5  Alkaline Phos 38 - 126 U/L 82  AST 15 - 41 U/L 19  ALT 0 - 44 U/L 15   CBC Latest Ref Rng & Units 01/10/2019  WBC 4.0  - 10.5 K/uL 5.3  Hemoglobin 12.0 - 15.0 g/dL 10.7(L)  Hematocrit 36.0 - 46.0 % 32.5(L)  Platelets 150 - 400 K/uL 219     Assessment and plan- Patient is a 68 y.o. female invasive adenocarcinoma of the endometrium endometrioid subtypeFIGO Stage IIIC2 pT1a N2 M0.   She is status post 3 cycles of carbotaxol chemotherapy and will be starting radiation treatment tomorrow  Patient starts radiation treatment tomorrow and will likely continue to get radiation for the next 10 to 11 weeks.  Her last chemotherapy was on 12/20/2018 and I am concerned that there is going to be a break of close to 3 months if not more before reinitiating 3 more cycles of chemotherapy.  I will touch base with Dr. Fransisca Connors regarding this as well.  I will see her back in first week of April with CBC CMP and Ca1 25-6 tentatively start cycle 4 of carbotaxol chemotherapy  Patient has baseline anemia of chronic disease and hemoglobin around 10 is essentially stable  Chemo-induced peripheral neuropathy: Secondary to Taxol.  Mild grade 1.  Continue to monitor   Visit Diagnosis 1. Endometrial adenocarcinoma ([place])   2. Chemotherapy-induced peripheral neuropathy ([place])   3. Anemia of chronic disease      Dr. Randa Evens, MD, MPH [facility] [phone] 01/10/2019 12:59 PM

## 2019-01-11 ENCOUNTER — Ambulatory Visit (INDEPENDENT_AMBULATORY_CARE_PROVIDER_SITE_OTHER): Payer: Medicare Other | Admitting: Family Medicine

## 2019-01-11 ENCOUNTER — Encounter: Payer: Self-pay | Admitting: Family Medicine

## 2019-01-11 VITALS — BP 120/80 | HR 75 | Temp 97.8°F | Resp 16 | Ht 59.0 in | Wt 213.6 lb

## 2019-01-11 DIAGNOSIS — C778 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: Secondary | ICD-10-CM | POA: Insufficient documentation

## 2019-01-11 DIAGNOSIS — I119 Hypertensive heart disease without heart failure: Secondary | ICD-10-CM

## 2019-01-11 DIAGNOSIS — G620 Drug-induced polyneuropathy: Secondary | ICD-10-CM

## 2019-01-11 DIAGNOSIS — E88819 Insulin resistance, unspecified: Secondary | ICD-10-CM

## 2019-01-11 DIAGNOSIS — D849 Immunodeficiency, unspecified: Secondary | ICD-10-CM | POA: Insufficient documentation

## 2019-01-11 DIAGNOSIS — F330 Major depressive disorder, recurrent, mild: Secondary | ICD-10-CM

## 2019-01-11 DIAGNOSIS — E785 Hyperlipidemia, unspecified: Secondary | ICD-10-CM | POA: Diagnosis not present

## 2019-01-11 DIAGNOSIS — I700 Atherosclerosis of aorta: Secondary | ICD-10-CM

## 2019-01-11 DIAGNOSIS — I2510 Atherosclerotic heart disease of native coronary artery without angina pectoris: Secondary | ICD-10-CM | POA: Diagnosis not present

## 2019-01-11 DIAGNOSIS — E8881 Metabolic syndrome: Secondary | ICD-10-CM

## 2019-01-11 DIAGNOSIS — T451X5A Adverse effect of antineoplastic and immunosuppressive drugs, initial encounter: Secondary | ICD-10-CM

## 2019-01-11 DIAGNOSIS — D899 Disorder involving the immune mechanism, unspecified: Secondary | ICD-10-CM

## 2019-01-11 DIAGNOSIS — I43 Cardiomyopathy in diseases classified elsewhere: Secondary | ICD-10-CM

## 2019-01-11 DIAGNOSIS — I10 Essential (primary) hypertension: Secondary | ICD-10-CM

## 2019-01-11 MED ORDER — OLMESARTAN MEDOXOMIL-HCTZ 20-12.5 MG PO TABS: 1.0000 | ORAL_TABLET | Freq: Every day | ORAL | 0 refills | Status: DC

## 2019-01-11 MED ORDER — EZETIMIBE 10 MG PO TABS: 10.0000 mg | ORAL_TABLET | Freq: Every day | ORAL | 0 refills | Status: DC

## 2019-01-11 MED ORDER — ESCITALOPRAM OXALATE 10 MG PO TABS: 10.0000 mg | ORAL_TABLET | Freq: Every day | ORAL | 0 refills | Status: DC

## 2019-01-11 MED ORDER — PREGABALIN 50 MG PO CAPS: 50.0000 mg | ORAL_CAPSULE | Freq: Two times a day (BID) | ORAL | 0 refills | Status: DC

## 2019-01-11 NOTE — Progress Notes (Signed)
Name: Molly [name]   MRN: [id number]    DOB: 05-09-1951   Date:01/11/2019       Progress Note  Subjective  Chief Complaint  Chief Complaint  Patient presents with  . Hypertension  . Asthma  . Prediabetes  . Hyperlipidemia    HPI  Asthma: mild intermittent, last flare was 03/2018, she stopped using Breo, occasionally has a cough , wheezing or SOB  Major Depression: doing better emotionally with lexapro, but states diagnosis of cancer made people act differently and she would like to go up on dose of medication.   Cardiomyopathy without CHF: back on lasix, no orthopnea, or chest pain, lower extremity edema slightly present today, but only taking medication prn now and doing well.   Endometrial cancer: s/p hysterectomy, she had post-op follow up with Dr. Marcelline Mates. She is also going to see Dr. Janese Banks and Dr. Baruch Gouty .   Morbid obesity: off victoza because of episode of pancreatitis, also has prediabetes,she was on metformin but we stopped medication since diagnosed with endometrial cancer so she does not lose weight    Dyslipidemia: cannot tolerate statin therapy, but is taking Zetia . Last LDL still not  at goal, but down to 123   HTN: bp is at goal, no chest pain, had a few episodes of fluttering sensation in her chest yesterday, lasted a few seconds and 3 times in a span of 30 minutes but resolved by itself. It happened in the pm and had skipped breakfast and lunch. Not associated with chest pain, SOB or nausea, no diaphoresis, it resolved after she ate.   OA: she can tolerate topical voltaren gel and takes Tylenol prn for pain, currently no swelling but has intermittent left knee pain. She would like a refill of topical medication   Chemo induced neuropathy: she states it can be mild 3/10 but at times very intense at times. Started a couple of weeks after her last treatment. We will try lyrica  Atherosclerosis aorta: on zetia, because she cannot tolerate statin therapy    Patient Active Problem List   Diagnosis Date Noted  . Iron deficiency anemia 11/15/2018  . Endometrial adenocarcinoma ([place]) 10/29/2018  . Umbilical hernia without obstruction and without gangrene   . Atherosclerosis of right coronary artery 11/10/2017  . Atherosclerosis of abdominal aorta ([place]) 11/10/2017  . Prediabetes 10/02/2017  . Pain in limb 03/31/2017  . Perennial allergic rhinitis with seasonal variation 05/14/2016  . Asthma, mild intermittent, well-controlled 05/14/2016  . Hypertension goal BP (blood pressure) < 140/90 12/10/2015  . Cardiomyopathy due to hypertension ([place]) 12/10/2015  . History of CVA (cerebrovascular accident) 12/10/2015  . Hyperlipidemia LDL goal <70 12/10/2015  . Statin intolerance 12/10/2015    Past Surgical History:  Procedure Laterality Date  . ABDOMINAL HYSTERECTOMY    . CHOLECYSTECTOMY    . COLONOSCOPY WITH PROPOFOL N/A 01/08/2017   Procedure: COLONOSCOPY WITH PROPOFOL;  Surgeon: [name], MD;  Location: [facility] ENDOSCOPY;  Service: Endoscopy;  Laterality: N/A;  . COLONOSCOPY WITH PROPOFOL N/A 05/05/2018   Procedure: COLONOSCOPY WITH PROPOFOL;  Surgeon: [name], MD;  Location: [facility] ENDOSCOPY;  Service: Gastroenterology;  Laterality: N/A;  . HERNIA REPAIR  32/1224   umbilical  . PORTACATH PLACEMENT N/A 11/04/2018   Procedure: INSERTION PORT-A-CATH;  Surgeon: Jules Husbands, MD;  Location: [facility] ORS;  Service: General;  Laterality: N/A;  . SENTINEL NODE BIOPSY N/A 10/06/2018   Procedure: SENTINEL NODE BIOPSY;  Surgeon: Mellody Drown, MD;  Location: [facility] ORS;  Service:  Gynecology;  Laterality: N/A;  . UMBILICAL HERNIA REPAIR N/A 11/17/2017   Procedure: HERNIA REPAIR UMBILICAL ADULT;  Surgeon: Jules Husbands, MD;  Location: [facility] ORS;  Service: General;  Laterality: N/A;    Family History  Problem Relation Age of Onset  . Stroke Mother   . Hypertension Mother   . Dementia Mother   . Alzheimer's disease Mother   . Gout Father   . Asthma  Father   . Hypertension Father   . Dementia Father   . Healthy Sister   . Stroke Brother   . Alzheimer's disease Brother   . Healthy Daughter   . Hypertension Brother   . Healthy Brother   . Cancer Paternal Grandmother   . Healthy Sister   . Healthy Sister   . Hypertension Sister   . Hypertension Sister   . Stroke Brother   . Alzheimer's disease Brother   . Stroke Brother   . Hypertension Brother   . Stroke Brother   . Hypertension Brother   . Healthy Brother   . Healthy Brother   . Hypertension Daughter   . Breast cancer Neg Hx     Social History   Socioeconomic History  . Marital status: Divorced    Spouse name: Not on file  . Number of children: 2  . Years of education: some college  . Highest education level: 12th grade  Occupational History  . 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 Occupation: currently a Careers adviser  . Financial resource strain: Not hard at all  . Food insecurity:    Worry: Never true    Inability: Never true  . Transportation needs:    Medical: No    Non-medical: No  Tobacco Use  . Smoking status: Never Smoker  . Smokeless tobacco: Never Used  . Tobacco comment: smoking cessation materials not required  Substance and Sexual Activity  . Alcohol use: No    Alcohol/week: 0.0 standard drinks  . Drug use: No  . Sexual activity: Not Currently  Lifestyle  . Physical activity:    Days per week: 0 days    Minutes per session: 0 min  . Stress: Not at all  Relationships  . Social connections:    Talks on phone: More than three times a week    Gets together: Three times a week    Attends religious service: More than 4 times per year    Active member of club or organization: Yes    Attends meetings of clubs or organizations: More than 4 times per year    Relationship status: Divorced  . Intimate partner violence:    Fear of current or ex partner: No    Emotionally abused: No    Physically abused: No     Forced sexual activity: No  Other Topics Concern  . Not on file  Social History Narrative  . Not on file     Current Outpatient Medications:  .  acetaminophen (TYLENOL) 500 MG tablet, Take 2 tablets (1,000 mg total) by mouth every 6 (six) hours as needed for mild pain or moderate pain., Disp: 30 tablet, Rfl: 0 .  albuterol (PROVENTIL HFA;VENTOLIN HFA) 108 (90 Base) MCG/ACT inhaler, Inhale 2 puffs into the lungs every 4 (four) hours as needed for wheezing or shortness of breath., Disp: 1 Inhaler, Rfl: 0 .  dexamethasone (DECADRON) 4 MG tablet, Take 2 tablets (8 mg total) by mouth daily. Start the  day after chemotherapy for 2 days., Disp: 30 tablet, Rfl: 1 .  diclofenac sodium (VOLTAREN) 1 % GEL, Apply 2 g topically 3 (three) times daily as needed (pain)., Disp: 100 g, Rfl: 1 .  escitalopram (LEXAPRO) 5 MG tablet, Take 1 tablet (5 mg total) by mouth daily., Disp: 90 tablet, Rfl: 0 .  ezetimibe (ZETIA) 10 MG tablet, Take 1 tablet (10 mg total) by mouth daily., Disp: 90 tablet, Rfl: 0 .  fluticasone furoate-vilanterol (BREO ELLIPTA) 100-25 MCG/INH AEPB, Inhale 1 puff into the lungs daily., Disp: 60 each, Rfl: 0 .  furosemide (LASIX) 40 MG tablet, Take 1 tablet (40 mg total) by mouth daily as needed for fluid., Disp: 90 tablet, Rfl: 0 .  ketoconazole (NIZORAL) 2 % cream, Apply 1 application topically daily as needed for irritation. On abdominal fold prn, Disp: 60 g, Rfl: 0 .  lidocaine-prilocaine (EMLA) cream, Apply to affected area once, Disp: 30 g, Rfl: 3 .  loratadine (CLARITIN) 10 MG tablet, Take 1 tablet by mouth daily., Disp: 30 tablet, Rfl: 5 .  LORazepam (ATIVAN) 0.5 MG tablet, Take 1 tablet (0.5 mg total) by mouth every 6 (six) hours as needed (Nausea or vomiting)., Disp: 30 tablet, Rfl: 0 .  morphine (MSIR) 15 MG tablet, Take 1 tablet (15 mg total) by mouth every 6 (six) hours as needed for severe pain., Disp: 20 tablet, Rfl: 0 .  olmesartan-hydrochlorothiazide (BENICAR HCT) 20-12.5 MG  tablet, Take 1 tablet by mouth daily., Disp: 90 tablet, Rfl: 0 .  omeprazole (PRILOSEC OTC) 20 MG tablet, Take 20 mg by mouth daily as needed., Disp: , Rfl:  .  ondansetron (ZOFRAN) 8 MG tablet, Take 1 tablet (8 mg total) by mouth 2 (two) times daily as needed for refractory nausea / vomiting. Start on day 3 after chemo., Disp: 30 tablet, Rfl: 1 .  prochlorperazine (COMPAZINE) 10 MG tablet, Take 10 mg by mouth every 6 (six) hours as needed for nausea or vomiting., Disp: , Rfl:  .  traMADol (ULTRAM) 50 MG tablet, Take 1 tablet (50 mg total) by mouth every 6 (six) hours as needed., Disp: 15 tablet, Rfl: 0 .  triamcinolone ointment (KENALOG) 0.5 %, Apply 1 application topically 2 (two) times daily., Disp: 30 g, Rfl: 0 No current facility-administered medications for this visit.   Facility-Administered Medications Ordered in Other Visits:  .  sodium chloride flush (NS) 0.9 % injection 10 mL, 10 mL, Intravenous, PRN, Sindy Guadeloupe, MD, 10 mL at 11/15/18 0915  Allergies  Allergen Reactions  . Ferumoxytol Anaphylaxis  . Nsaids Hives and Rash  . Statins Other (See Comments)    Joint pains  . Victoza [Liraglutide] Other (See Comments)    pancreatitis  . Oxycodone Nausea And Vomiting  . Crestor [Rosuvastatin Calcium] Rash    I personally reviewed active problem list, medication list, allergies, family history, social history with the patient/caregiver today.   ROS  Constitutional: Negative for fever or weight change.  Respiratory: Negative for cough and shortness of breath.   Cardiovascular: Negative for chest pain , positive for  Palpitations - had it yesterday 3 episodes about 30 seconds, she had fasted, advised to monitor and let us know if recurrence for holter monitor .  Gastrointestinal: Negative for abdominal pain, no bowel changes.  Musculoskeletal: Negative for gait problem or joint swelling.  Skin: Negative for rash.  Neurological: Negative for dizziness or headache.  No other  specific complaints in a complete review of systems (except as listed in  HPI above).  Objective  Vitals:   01/11/19 1514  BP: 120/80  Pulse: 75  Resp: 16  Temp: 97.8 F (36.6 C)  TempSrc: Oral  SpO2: 98%  Weight: 213 lb 9.6 oz (96.9 kg)  Height: 4' 11"  (1.499 m)    Body mass index is 43.14 kg/m.  Physical Exam  Constitutional: Patient appears well-developed and well-nourished. Obese No distress.  HEENT: head atraumatic, normocephalic, pupils equal and reactive to light, eneck supple, throat within normal limits Cardiovascular: Normal rate, regular rhythm and normal heart sounds.  No murmur heard. No BLE edema. Pulmonary/Chest: Effort normal and breath sounds normal. No respiratory distress. Abdominal: Soft.  There is no tenderness. Psychiatric: Patient has a normal mood and affect. behavior is normal. Judgment and thought content normal.  Recent Results (from the past 2160 hour(s))  Protime-INR     Status: None   Collection Time: 10/28/18 12:18 PM  Result Value Ref Range   Prothrombin Time 13.2 11.4 - 15.2 seconds   INR 1.01     Comment: Performed at [facility], [address] [place], [place] [address]  Comprehensive metabolic panel     Status: Abnormal   Collection Time: 10/28/18 12:18 PM  Result Value Ref Range   Sodium 141 135 - 145 mmol/L   Potassium 3.8 3.5 - 5.1 mmol/L   Chloride 106 98 - 111 mmol/L   CO2 27 22 - 32 mmol/L   Glucose, Bld 103 (H) 70 - 99 mg/dL   BUN 13 8 - 23 mg/dL   Creatinine, Ser 0.82 0.44 - 1.00 mg/dL   Calcium 9.6 8.9 - 10.3 mg/dL   Total Protein 7.6 6.5 - 8.1 g/dL   Albumin 4.2 3.5 - 5.0 g/dL   AST 24 15 - 41 U/L   ALT 31 0 - 44 U/L   Alkaline Phosphatase 87 38 - 126 U/L   Total Bilirubin 0.6 0.3 - 1.2 mg/dL   GFR calc non Af Amer >60 >60 mL/min   GFR calc Af Amer >60 >60 mL/min    Comment: (NOTE) The eGFR has been calculated using the CKD EPI equation. This calculation has not been validated in all clinical  situations. eGFR's persistently <60 mL/min signify possible Chronic Kidney Disease.    Anion gap 8 5 - 15    Comment: Performed at [facility], [address] [place],  [address]  CBC with Differential     Status: Abnormal   Collection Time: 10/28/18 12:18 PM  Result Value Ref Range   WBC 4.9 4.0 - 10.5 K/uL   RBC 3.87 3.87 - 5.11 MIL/uL   Hemoglobin 11.1 (L) 12.0 - 15.0 g/dL   HCT 34.6 (L) 36.0 - 46.0 %   MCV 89.4 80.0 - 100.0 fL   MCH 28.7 26.0 - 34.0 pg   MCHC 32.1 30.0 - 36.0 g/dL   RDW 12.5 11.5 - 15.5 %   Platelets 340 150 - 400 K/uL   nRBC 0.0 0.0 - 0.2 %   Neutrophils Relative % 47 %   Neutro Abs 2.3 1.7 - 7.7 K/uL   Lymphocytes Relative 42 %   Lymphs Abs 2.1 0.7 - 4.0 K/uL   Monocytes Relative 7 %   Monocytes Absolute 0.3 0.1 - 1.0 K/uL   Eosinophils Relative 3 %   Eosinophils Absolute 0.1 0.0 - 0.5 K/uL   Basophils Relative 1 %   Basophils Absolute 0.0 0.0 - 0.1 K/uL   Immature Granulocytes 0 %   Abs Immature  Granulocytes 0.01 0.00 - 0.07 K/uL    Comment: Performed at [facility], [address] [place], [place] [address]  Glucose, capillary     Status: None   Collection Time: 10/29/18 11:24 AM  Result Value Ref Range   Glucose-Capillary 94 70 - 99 mg/dL  Comprehensive metabolic panel     Status: Abnormal   Collection Time: 11/05/18  8:19 AM  Result Value Ref Range   Sodium 142 135 - 145 mmol/L   Potassium 3.9 3.5 - 5.1 mmol/L   Chloride 104 98 - 111 mmol/L   CO2 29 22 - 32 mmol/L   Glucose, Bld 105 (H) 70 - 99 mg/dL   BUN 11 8 - 23 mg/dL   Creatinine, Ser 0.73 0.44 - 1.00 mg/dL   Calcium 8.7 (L) 8.9 - 10.3 mg/dL   Total Protein 6.9 6.5 - 8.1 g/dL   Albumin 3.8 3.5 - 5.0 g/dL   AST 21 15 - 41 U/L   ALT 16 0 - 44 U/L   Alkaline Phosphatase 73 38 - 126 U/L   Total Bilirubin 0.7 0.3 - 1.2 mg/dL   GFR calc non Af Amer >60 >60 mL/min   GFR calc Af Amer >60 >60 mL/min    Comment: (NOTE) The eGFR has been calculated using the CKD EPI  equation. This calculation has not been validated in all clinical situations. eGFR's persistently <60 mL/min signify possible Chronic Kidney Disease.    Anion gap 9 5 - 15    Comment: Performed at [facility], [address] [place], [place] [address]  CBC with Differential     Status: Abnormal   Collection Time: 11/05/18  8:19 AM  Result Value Ref Range   WBC 4.6 4.0 - 10.5 K/uL   RBC 3.83 (L) 3.87 - 5.11 MIL/uL   Hemoglobin 10.9 (L) 12.0 - 15.0 g/dL   HCT 34.2 (L) 36.0 - 46.0 %   MCV 89.3 80.0 - 100.0 fL   MCH 28.5 26.0 - 34.0 pg   MCHC 31.9 30.0 - 36.0 g/dL   RDW 12.4 11.5 - 15.5 %   Platelets 289 150 - 400 K/uL   nRBC 0.0 0.0 - 0.2 %   Neutrophils Relative % 50 %   Neutro Abs 2.3 1.7 - 7.7 K/uL   Lymphocytes Relative 40 %   Lymphs Abs 1.8 0.7 - 4.0 K/uL   Monocytes Relative 8 %   Monocytes Absolute 0.4 0.1 - 1.0 K/uL   Eosinophils Relative 2 %   Eosinophils Absolute 0.1 0.0 - 0.5 K/uL   Basophils Relative 0 %   Basophils Absolute 0.0 0.0 - 0.1 K/uL   Immature Granulocytes 0 %   Abs Immature Granulocytes 0.01 0.00 - 0.07 K/uL    Comment: Performed at [facility], [address] [place], [place] [address]  Vitamin B12     Status: None   Collection Time: 11/05/18 10:24 AM  Result Value Ref Range   Vitamin B-12 443 180 - 914 pg/mL    Comment: (NOTE) This assay is not validated for testing neonatal or myeloproliferative syndrome specimens for Vitamin B12 levels. Performed at [facility], [place] [address], [place], [place] [address]   Iron and TIBC     Status: None   Collection Time: 11/05/18 10:24 AM  Result Value Ref Range   Iron 60 28 - 170 ug/dL   TIBC 312 250 - 450 ug/dL   Saturation Ratios 19 10.4 - 31.8 %  UIBC 252 ug/dL    Comment: Performed at [facility], [address] [place], [place] [address]  Folate     Status: None   Collection Time: 11/05/18 10:24 AM  Result Value Ref Range   Folate 8.7 >5.9 ng/mL    Comment:  Performed at [facility], [address] [place], [place] [address]  Ferritin     Status: None   Collection Time: 11/05/18 10:24 AM  Result Value Ref Range   Ferritin 25 11 - 307 ng/mL    Comment: Performed at [facility], [address] [place], [place] [address]  CBC with Differential/Platelet     Status: Abnormal   Collection Time: 11/15/18  9:33 AM  Result Value Ref Range   WBC 56.0 (HH) 4.0 - 10.5 K/uL    Comment: This critical result has verified and been called to [place] by Volney Presser on 11 18 2019 at 1003, and has been read back.    RBC 3.70 (L) 3.87 - 5.11 MIL/uL   Hemoglobin 10.9 (L) 12.0 - 15.0 g/dL   HCT 33.8 (L) 36.0 - 46.0 %   MCV 91.4 80.0 - 100.0 fL   MCH 29.5 26.0 - 34.0 pg   MCHC 32.2 30.0 - 36.0 g/dL   RDW 14.0 11.5 - 15.5 %   Platelets 225 150 - 400 K/uL   nRBC 0.2 0.0 - 0.2 %   Neutrophils Relative % 72 %   Neutro Abs 40.0 (H) 1.7 - 7.7 K/uL   Lymphocytes Relative 9 %   Lymphs Abs 5.2 (H) 0.7 - 4.0 K/uL   Monocytes Relative 5 %   Monocytes Absolute 2.9 (H) 0.1 - 1.0 K/uL   Eosinophils Relative 0 %   Eosinophils Absolute 0.1 0.0 - 0.5 K/uL   Basophils Relative 0 %   Basophils Absolute 0.0 0.0 - 0.1 K/uL   WBC Morphology      MODERATE LEFT SHIFT (>5% METAS AND MYELOS,OCC PRO NOTED)   Immature Granulocytes 14 %   Abs Immature Granulocytes 7.75 (H) 0.00 - 0.07 K/uL    Comment: Performed at [facility], [address], [place], [place] [address]  Comprehensive metabolic panel     Status: Abnormal   Collection Time: 11/15/18  9:33 AM  Result Value Ref Range   Sodium 139 135 - 145 mmol/L   Potassium 3.6 3.5 - 5.1 mmol/L   Chloride 102 98 - 111 mmol/L   CO2 27 22 - 32 mmol/L   Glucose, Bld 108 (H) 70 - 99 mg/dL   BUN 12 8 - 23 mg/dL   Creatinine, Ser 0.74 0.44 - 1.00 mg/dL   Calcium 8.6 (L) 8.9 - 10.3 mg/dL   Total Protein 7.0 6.5 - 8.1 g/dL   Albumin 4.0 3.5 - 5.0 g/dL   AST 37 15 - 41 U/L   ALT 41 0 - 44  U/L   Alkaline Phosphatase 165 (H) 38 - 126 U/L   Total Bilirubin 0.3 0.3 - 1.2 mg/dL   GFR calc non Af Amer >60 >60 mL/min   GFR calc Af Amer >60 >60 mL/min    Comment: (NOTE) The eGFR has been calculated using the CKD EPI equation. This calculation has not been validated in all clinical situations. eGFR's persistently <60 mL/min signify possible Chronic Kidney Disease.    Anion gap 10 5 - 15    Comment: Performed at [facility], [address], [place],  [address]  Comprehensive metabolic  panel     Status: Abnormal   Collection Time: 11/29/18  8:54 AM  Result Value Ref Range   Sodium 139 135 - 145 mmol/L   Potassium 3.7 3.5 - 5.1 mmol/L   Chloride 103 98 - 111 mmol/L   CO2 27 22 - 32 mmol/L   Glucose, Bld 103 (H) 70 - 99 mg/dL   BUN 13 8 - 23 mg/dL   Creatinine, Ser 0.60 0.44 - 1.00 mg/dL   Calcium 9.3 8.9 - 10.3 mg/dL   Total Protein 7.3 6.5 - 8.1 g/dL   Albumin 4.0 3.5 - 5.0 g/dL   AST 22 15 - 41 U/L   ALT 22 0 - 44 U/L   Alkaline Phosphatase 96 38 - 126 U/L   Total Bilirubin 0.9 0.3 - 1.2 mg/dL   GFR calc non Af Amer >60 >60 mL/min   GFR calc Af Amer >60 >60 mL/min   Anion gap 9 5 - 15    Comment: Performed at [facility], [address], [place], [place] [address]  CBC with Differential/Platelet     Status: Abnormal   Collection Time: 11/29/18  8:54 AM  Result Value Ref Range   WBC 4.5 4.0 - 10.5 K/uL   RBC 3.57 (L) 3.87 - 5.11 MIL/uL   Hemoglobin 10.6 (L) 12.0 - 15.0 g/dL   HCT 31.9 (L) 36.0 - 46.0 %   MCV 89.4 80.0 - 100.0 fL   MCH 29.7 26.0 - 34.0 pg   MCHC 33.2 30.0 - 36.0 g/dL   RDW 13.5 11.5 - 15.5 %   Platelets 354 150 - 400 K/uL   nRBC 0.0 0.0 - 0.2 %   Neutrophils Relative % 52 %   Neutro Abs 2.4 1.7 - 7.7 K/uL   Lymphocytes Relative 36 %   Lymphs Abs 1.6 0.7 - 4.0 K/uL   Monocytes Relative 10 %   Monocytes Absolute 0.4 0.1 - 1.0 K/uL   Eosinophils Relative 1 %   Eosinophils Absolute 0.1 0.0 - 0.5 K/uL    Basophils Relative 1 %   Basophils Absolute 0.0 0.0 - 0.1 K/uL   Immature Granulocytes 0 %   Abs Immature Granulocytes 0.02 0.00 - 0.07 K/uL    Comment: Performed at [facility], [address], [place], [place] [address]  Comprehensive metabolic panel     Status: Abnormal   Collection Time: 12/20/18  8:24 AM  Result Value Ref Range   Sodium 138 135 - 145 mmol/L   Potassium 3.7 3.5 - 5.1 mmol/L   Chloride 103 98 - 111 mmol/L   CO2 27 22 - 32 mmol/L   Glucose, Bld 101 (H) 70 - 99 mg/dL   BUN 16 8 - 23 mg/dL   Creatinine, Ser 0.64 0.44 - 1.00 mg/dL   Calcium 9.1 8.9 - 10.3 mg/dL   Total Protein 7.3 6.5 - 8.1 g/dL   Albumin 4.0 3.5 - 5.0 g/dL   AST 23 15 - 41 U/L   ALT 22 0 - 44 U/L   Alkaline Phosphatase 96 38 - 126 U/L   Total Bilirubin 0.7 0.3 - 1.2 mg/dL   GFR calc non Af Amer >60 >60 mL/min   GFR calc Af Amer >60 >60 mL/min   Anion gap 8 5 - 15    Comment: Performed at [facility], [address], [place], [place] [address]  CBC with Differential/Platelet     Status: Abnormal   Collection Time: 12/20/18  8:24  AM  Result Value Ref Range   WBC 4.9 4.0 - 10.5 K/uL   RBC 3.51 (L) 3.87 - 5.11 MIL/uL   Hemoglobin 10.5 (L) 12.0 - 15.0 g/dL   HCT 32.1 (L) 36.0 - 46.0 %   MCV 91.5 80.0 - 100.0 fL   MCH 29.9 26.0 - 34.0 pg   MCHC 32.7 30.0 - 36.0 g/dL   RDW 15.9 (H) 11.5 - 15.5 %   Platelets 319 150 - 400 K/uL   nRBC 0.0 0.0 - 0.2 %   Neutrophils Relative % 57 %   Neutro Abs 2.8 1.7 - 7.7 K/uL   Lymphocytes Relative 33 %   Lymphs Abs 1.6 0.7 - 4.0 K/uL   Monocytes Relative 9 %   Monocytes Absolute 0.5 0.1 - 1.0 K/uL   Eosinophils Relative 1 %   Eosinophils Absolute 0.0 0.0 - 0.5 K/uL   Basophils Relative 0 %   Basophils Absolute 0.0 0.0 - 0.1 K/uL   Immature Granulocytes 0 %   Abs Immature Granulocytes 0.01 0.00 - 0.07 K/uL    Comment: Performed at [facility], [address], [place], [place] [address]  Comprehensive metabolic  panel     Status: Abnormal   Collection Time: 01/10/19 10:14 AM  Result Value Ref Range   Sodium 142 135 - 145 mmol/L   Potassium 3.7 3.5 - 5.1 mmol/L   Chloride 104 98 - 111 mmol/L   CO2 29 22 - 32 mmol/L   Glucose, Bld 109 (H) 70 - 99 mg/dL   BUN 9 8 - 23 mg/dL   Creatinine, Ser 0.71 0.44 - 1.00 mg/dL   Calcium 9.2 8.9 - 10.3 mg/dL   Total Protein 7.2 6.5 - 8.1 g/dL   Albumin 3.9 3.5 - 5.0 g/dL   AST 19 15 - 41 U/L   ALT 15 0 - 44 U/L   Alkaline Phosphatase 82 38 - 126 U/L   Total Bilirubin 0.5 0.3 - 1.2 mg/dL   GFR calc non Af Amer >60 >60 mL/min   GFR calc Af Amer >60 >60 mL/min   Anion gap 9 5 - 15    Comment: Performed at [facility], [address] [place], [place] [address]  CBC with Differential/Platelet     Status: Abnormal   Collection Time: 01/10/19 10:14 AM  Result Value Ref Range   WBC 5.3 4.0 - 10.5 K/uL   RBC 3.54 (L) 3.87 - 5.11 MIL/uL   Hemoglobin 10.7 (L) 12.0 - 15.0 g/dL   HCT 32.5 (L) 36.0 - 46.0 %   MCV 91.8 80.0 - 100.0 fL   MCH 30.2 26.0 - 34.0 pg   MCHC 32.9 30.0 - 36.0 g/dL   RDW 15.3 11.5 - 15.5 %   Platelets 219 150 - 400 K/uL   nRBC 0.0 0.0 - 0.2 %   Neutrophils Relative % 57 %   Neutro Abs 3.0 1.7 - 7.7 K/uL   Lymphocytes Relative 33 %   Lymphs Abs 1.7 0.7 - 4.0 K/uL   Monocytes Relative 9 %   Monocytes Absolute 0.5 0.1 - 1.0 K/uL   Eosinophils Relative 1 %   Eosinophils Absolute 0.0 0.0 - 0.5 K/uL   Basophils Relative 0 %   Basophils Absolute 0.0 0.0 - 0.1 K/uL   Immature Granulocytes 0 %   Abs Immature Granulocytes 0.02 0.00 - 0.07 K/uL    Comment: Performed at [facility], [address] [place], [place] [address]  PHQ2/9: Depression screen [facility] 2/9 11/19/2018 10/20/2018 09/07/2018 05/07/2018 04/06/2018  Decreased Interest 0 0 1 1 0  Down, Depressed, Hopeless 0 0 1 0 0  PHQ - 2 Score 0 0 2 1 0  Altered sleeping 0 0 1 0 0  Tired, decreased energy 0 0 2 2 0  Change in appetite 0 0 2 0 0  Feeling bad or failure  about yourself  0 0 0 0 0  Trouble concentrating 0 0 1 1 0  Moving slowly or fidgety/restless 0 0 0 0 0  Suicidal thoughts 0 0 0 0 0  PHQ-9 Score 0 0 8 4 0  Difficult doing work/chores Not difficult at all Not difficult at all Somewhat difficult Not difficult at all Not difficult at all     Fall Risk: Fall Risk  01/11/2019 11/19/2018 11/01/2018 10/20/2018 09/07/2018  Falls in the past year? 0 0 0 No No  Number falls in past yr: 0 - - - -  Injury with Fall? 0 - - - -  Risk for fall due to : - - - - -  Risk for fall due to: Comment - - - - -      Assessment & Plan  1. Hyperlipidemia LDL goal <70  - ezetimibe (ZETIA) 10 MG tablet; Take 1 tablet (10 mg total) by mouth daily.  Dispense: 90 tablet; Refill: 0  2. Atherosclerosis of abdominal aorta (HCC)  - ezetimibe (ZETIA) 10 MG tablet; Take 1 tablet (10 mg total) by mouth daily.  Dispense: 90 tablet; Refill: 0  3. Mild recurrent major depression (HCC)  - escitalopram (LEXAPRO) 10 MG tablet; Take 1 tablet (10 mg total) by mouth daily.  Dispense: 90 tablet; Refill: 0  4. Atherosclerosis of right coronary artery   5. Chemotherapy-induced neuropathy (HCC)  - pregabalin (LYRICA) 50 MG capsule; Take 1 capsule (50 mg total) by mouth 2 (two) times daily.  Dispense: 60 capsule; Refill: 0  6. Hypertension goal BP (blood pressure) < 140/90  - olmesartan-hydrochlorothiazide (BENICAR HCT) 20-12.5 MG tablet; Take 1 tablet by mouth daily.  Dispense: 90 tablet; Refill: 0  7. Morbid obesity ([place])  Not indicated to lose weight at this time, having chemotherapy   8. Insulin resistance  We will recheck hgbA1C next visit   9. 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 Baruch Gouty afterwards   10. Immunosuppressed status ([place])  Discussed washing hands, avoid crowed spaces.   11. Cardiomyopathy due to hypertension, without heart failure  ([place])  stable

## 2019-01-13 ENCOUNTER — Other Ambulatory Visit: Payer: Self-pay | Admitting: Oncology

## 2019-01-14 ENCOUNTER — Inpatient Hospital Stay: Payer: Medicare Other

## 2019-01-14 ENCOUNTER — Encounter: Payer: Self-pay | Admitting: *Deleted

## 2019-01-14 VITALS — BP 138/83 | HR 71 | Temp 97.8°F | Resp 18 | Wt 213.0 lb

## 2019-01-14 DIAGNOSIS — D709 Neutropenia, unspecified: Secondary | ICD-10-CM | POA: Diagnosis not present

## 2019-01-14 DIAGNOSIS — R112 Nausea with vomiting, unspecified: Secondary | ICD-10-CM | POA: Diagnosis not present

## 2019-01-14 DIAGNOSIS — D638 Anemia in other chronic diseases classified elsewhere: Secondary | ICD-10-CM | POA: Diagnosis not present

## 2019-01-14 DIAGNOSIS — C541 Malignant neoplasm of endometrium: Secondary | ICD-10-CM

## 2019-01-14 DIAGNOSIS — Z5111 Encounter for antineoplastic chemotherapy: Secondary | ICD-10-CM | POA: Diagnosis not present

## 2019-01-14 DIAGNOSIS — E876 Hypokalemia: Secondary | ICD-10-CM | POA: Diagnosis not present

## 2019-01-14 DIAGNOSIS — G620 Drug-induced polyneuropathy: Secondary | ICD-10-CM | POA: Diagnosis not present

## 2019-01-14 DIAGNOSIS — E860 Dehydration: Secondary | ICD-10-CM | POA: Diagnosis not present

## 2019-01-14 MED ORDER — SODIUM CHLORIDE 0.9 % IV SOLN
540.0000 mg | Freq: Once | INTRAVENOUS | Status: AC
  Administered 2019-01-14: 540 mg via INTRAVENOUS
  Filled 2019-01-14: qty 54

## 2019-01-14 MED ORDER — PALONOSETRON HCL INJECTION 0.25 MG/5ML
0.2500 mg | Freq: Once | INTRAVENOUS | Status: AC
  Administered 2019-01-14: 0.25 mg via INTRAVENOUS
  Filled 2019-01-14: qty 5

## 2019-01-14 MED ORDER — DIPHENHYDRAMINE HCL 50 MG/ML IJ SOLN
50.0000 mg | Freq: Once | INTRAMUSCULAR | Status: AC
  Administered 2019-01-14: 50 mg via INTRAVENOUS
  Filled 2019-01-14: qty 1

## 2019-01-14 MED ORDER — FAMOTIDINE IN NACL 20-0.9 MG/50ML-% IV SOLN
20.0000 mg | Freq: Once | INTRAVENOUS | Status: AC
  Administered 2019-01-14: 20 mg via INTRAVENOUS
  Filled 2019-01-14: qty 50

## 2019-01-14 MED ORDER — HEPARIN SOD (PORK) LOCK FLUSH 100 UNIT/ML IV SOLN
500.0000 [IU] | Freq: Once | INTRAVENOUS | Status: AC | PRN
  Administered 2019-01-14: 500 [IU]
  Filled 2019-01-14: qty 5

## 2019-01-14 MED ORDER — SODIUM CHLORIDE 0.9 % IV SOLN
150.0000 mg/m2 | Freq: Once | INTRAVENOUS | Status: AC
  Administered 2019-01-14: 300 mg via INTRAVENOUS
  Filled 2019-01-14: qty 50

## 2019-01-14 MED ORDER — SODIUM CHLORIDE 0.9 % IV SOLN
20.0000 mg | Freq: Once | INTRAVENOUS | Status: AC
  Administered 2019-01-14: 20 mg via INTRAVENOUS
  Filled 2019-01-14: qty 2

## 2019-01-14 MED ORDER — SODIUM CHLORIDE 0.9 % IV SOLN
Freq: Once | INTRAVENOUS | Status: AC
  Administered 2019-01-14: 09:00:00 via INTRAVENOUS
  Filled 2019-01-14: qty 250

## 2019-01-14 NOTE — Progress Notes (Signed)
Received call from [facility]) to obtain patient balance for spend down for Medicaid.

## 2019-01-17 ENCOUNTER — Ambulatory Visit: Payer: Medicare Other

## 2019-01-18 ENCOUNTER — Other Ambulatory Visit: Payer: Self-pay | Admitting: *Deleted

## 2019-01-18 ENCOUNTER — Ambulatory Visit: Payer: Medicare Other

## 2019-01-18 MED ORDER — TRAMADOL HCL 50 MG PO TABS: 50.0000 mg | ORAL_TABLET | Freq: Four times a day (QID) | ORAL | 0 refills | Status: DC | PRN

## 2019-01-19 ENCOUNTER — Other Ambulatory Visit: Payer: Self-pay | Admitting: *Deleted

## 2019-01-19 ENCOUNTER — Encounter: Payer: Self-pay | Admitting: Oncology

## 2019-01-19 ENCOUNTER — Inpatient Hospital Stay: Payer: Medicare Other

## 2019-01-19 ENCOUNTER — Other Ambulatory Visit: Payer: Self-pay

## 2019-01-19 ENCOUNTER — Inpatient Hospital Stay (HOSPITAL_BASED_OUTPATIENT_CLINIC_OR_DEPARTMENT_OTHER): Payer: Medicare Other | Admitting: Oncology

## 2019-01-19 ENCOUNTER — Ambulatory Visit: Payer: Medicare Other

## 2019-01-19 VITALS — BP 117/78 | HR 64 | Temp 98.5°F | Resp 22

## 2019-01-19 DIAGNOSIS — E860 Dehydration: Secondary | ICD-10-CM

## 2019-01-19 DIAGNOSIS — R630 Anorexia: Secondary | ICD-10-CM

## 2019-01-19 DIAGNOSIS — E876 Hypokalemia: Secondary | ICD-10-CM | POA: Diagnosis not present

## 2019-01-19 DIAGNOSIS — C541 Malignant neoplasm of endometrium: Secondary | ICD-10-CM

## 2019-01-19 DIAGNOSIS — R110 Nausea: Secondary | ICD-10-CM

## 2019-01-19 DIAGNOSIS — D638 Anemia in other chronic diseases classified elsewhere: Secondary | ICD-10-CM | POA: Diagnosis not present

## 2019-01-19 DIAGNOSIS — D709 Neutropenia, unspecified: Secondary | ICD-10-CM

## 2019-01-19 DIAGNOSIS — G620 Drug-induced polyneuropathy: Secondary | ICD-10-CM | POA: Diagnosis not present

## 2019-01-19 DIAGNOSIS — R112 Nausea with vomiting, unspecified: Secondary | ICD-10-CM

## 2019-01-19 DIAGNOSIS — Z95828 Presence of other vascular implants and grafts: Secondary | ICD-10-CM

## 2019-01-19 DIAGNOSIS — Z5111 Encounter for antineoplastic chemotherapy: Secondary | ICD-10-CM | POA: Diagnosis not present

## 2019-01-19 DIAGNOSIS — Z5189 Encounter for other specified aftercare: Secondary | ICD-10-CM

## 2019-01-19 DIAGNOSIS — R531 Weakness: Secondary | ICD-10-CM

## 2019-01-19 LAB — COMPREHENSIVE METABOLIC PANEL
ALT: 13 U/L (ref 0–44)
AST: 18 U/L (ref 15–41)
Albumin: 3.8 g/dL (ref 3.5–5.0)
Alkaline Phosphatase: 67 U/L (ref 38–126)
Anion gap: 7 (ref 5–15)
BUN: 17 mg/dL (ref 8–23)
CO2: 29 mmol/L (ref 22–32)
Calcium: 8.8 mg/dL — ABNORMAL LOW (ref 8.9–10.3)
Chloride: 101 mmol/L (ref 98–111)
Creatinine, Ser: 0.59 mg/dL (ref 0.44–1.00)
GFR calc Af Amer: >60 mL/min (ref >60)
GFR calc non Af Amer: >60 mL/min (ref >60)
Glucose, Bld: 102 mg/dL — ABNORMAL HIGH (ref 70–99)
Potassium: 3.3 mmol/L — ABNORMAL LOW (ref 3.5–5.1)
Sodium: 137 mmol/L (ref 135–145)
Total Bilirubin: 0.6 mg/dL (ref 0.3–1.2)
Total Protein: 6.7 g/dL (ref 6.5–8.1)

## 2019-01-19 LAB — CBC WITH DIFFERENTIAL/PLATELET
Abs Immature Granulocytes: 0 10*3/uL (ref 0.00–0.07)
Basophils Absolute: 0 10*3/uL (ref 0.0–0.1)
Basophils Relative: 0 %
EOS ABS: 0.1 10*3/uL (ref 0.0–0.5)
Eosinophils Relative: 2 %
HCT: 34.3 % — ABNORMAL LOW (ref 36.0–46.0)
Hemoglobin: 11.6 g/dL — ABNORMAL LOW (ref 12.0–15.0)
Immature Granulocytes: 0 %
Lymphocytes Relative: 53 %
Lymphs Abs: 1.7 10*3/uL (ref 0.7–4.0)
MCH: 30.5 pg (ref 26.0–34.0)
MCHC: 33.8 g/dL (ref 30.0–36.0)
MCV: 90.3 fL (ref 80.0–100.0)
Monocytes Absolute: 0.1 10*3/uL (ref 0.1–1.0)
Monocytes Relative: 2 %
NRBC: 0 % (ref 0.0–0.2)
Neutro Abs: 1.4 10*3/uL — ABNORMAL LOW (ref 1.7–7.7)
Neutrophils Relative %: 43 %
Platelets: 234 10*3/uL (ref 150–400)
RBC: 3.8 MIL/uL — ABNORMAL LOW (ref 3.87–5.11)
RDW: 15.1 % (ref 11.5–15.5)
WBC: 3.2 10*3/uL — ABNORMAL LOW (ref 4.0–10.5)

## 2019-01-19 LAB — MAGNESIUM: Magnesium: 1.5 mg/dL — ABNORMAL LOW (ref 1.7–2.4)

## 2019-01-19 MED ORDER — TRAMADOL HCL 50 MG PO TABS: 50.0000 mg | ORAL_TABLET | Freq: Four times a day (QID) | ORAL | 0 refills | Status: DC | PRN

## 2019-01-19 MED ORDER — HEPARIN SOD (PORK) LOCK FLUSH 100 UNIT/ML IV SOLN
500.0000 [IU] | Freq: Once | INTRAVENOUS | Status: AC
  Administered 2019-01-19: 500 [IU] via INTRAVENOUS

## 2019-01-19 MED ORDER — SODIUM CHLORIDE 0.9 % IV SOLN
Freq: Once | INTRAVENOUS | Status: AC
  Administered 2019-01-19: 14:00:00 via INTRAVENOUS
  Filled 2019-01-19: qty 250

## 2019-01-19 MED ORDER — SODIUM CHLORIDE 0.9 % IV SOLN
Freq: Once | INTRAVENOUS | Status: AC
  Administered 2019-01-19: 15:00:00 via INTRAVENOUS
  Filled 2019-01-19: qty 10

## 2019-01-19 MED ORDER — DEXAMETHASONE SODIUM PHOSPHATE 10 MG/ML IJ SOLN
10.0000 mg | Freq: Once | INTRAMUSCULAR | Status: AC
  Administered 2019-01-19: 10 mg via INTRAVENOUS
  Filled 2019-01-19: qty 1

## 2019-01-19 MED ORDER — SODIUM CHLORIDE 0.9% FLUSH
10.0000 mL | INTRAVENOUS | Status: DC | PRN
  Administered 2019-01-19: 10 mL via INTRAVENOUS
  Filled 2019-01-19: qty 10

## 2019-01-19 MED ORDER — ONDANSETRON HCL 4 MG/2ML IJ SOLN
8.0000 mg | Freq: Once | INTRAMUSCULAR | Status: AC
  Administered 2019-01-19: 8 mg via INTRAVENOUS
  Filled 2019-01-19: qty 4

## 2019-01-19 NOTE — Telephone Encounter (Signed)
Patient called cancer center requesting refill of Tramadol 50 mg q 6 prn for pain.   As mandated by the [place] STOP Act (Strengthen Opioid Misuse Prevention), the [place] Controlled Substance Reporting System ([place]) was reviewed for this patient.  Below is the past 41-months of controlled substance prescriptions as displayed by the registry.  I have personally consulted with my supervising physician, Dr. Janese Banks, who agrees that continuation of opiate therapy is medically appropriate at this time and agrees to provide continual monitoring, including urine/blood drug screens, as indicated. Refill is appropriate on or after 12/12/18.  [place]CC[place] reviewed:     [name], NP 01/19/2019 10:55 AM [id number]

## 2019-01-19 NOTE — Patient Instructions (Signed)
It was nice to meet you today.   If you begin to feel weak or fatigued please call our clinic and we will be happy to see you for IV fluids and or electrolyte replacement.   Call [phone] option 3  [name], NP 01/19/2019 3:53 PM    Neutropenia Neutropenia is a condition that occurs when you have a lower-than-normal level of a type of white blood cell (neutrophil) in your body. Neutrophils are made in the spongy center of large bones (bone marrow) and they fight infections. Neutrophils are your body's main defense against bacterial and fungal infections. The fewer neutrophils you have and the longer your body remains without them, the greater your risk of getting a severe infection. What are the causes? This condition can occur if your body uses up or destroys neutrophils faster than your bone marrow can make them. This problem may happen because of:  Bacterial or fungal infection.  Allergic disorders.  Reactions to some medicines.  Autoimmune disease.  An enlarged spleen. This condition can also occur if your bone marrow does not produce enough neutrophils. This problem may be caused by:  Cancer.  Cancer treatments, such as radiation or chemotherapy.  Viral infections.  Medicines, such as phenytoin.  Vitamin B12 deficiency.  Diseases of the bone marrow.  Environmental toxins, such as insecticides. What are the signs or symptoms? This condition does not usually cause symptoms. If symptoms are present, they are usually caused by an underlying infection. Symptoms of an infection may include:  Fever.  Chills.  Swollen glands.  Oral or anal ulcers.  Cough and shortness of breath.  Rash.  Skin infection.  Fatigue. How is this diagnosed? Your health care provider may suspect neutropenia if you have:  A condition that may cause neutropenia.  Symptoms of infection, especially fever.  Frequent and unusual infections. You will have a medical history  and physical exam. Tests will also be done, such as:  A complete blood count (CBC).  A procedure to collect a sample of bone marrow for examination (bone marrow biopsy).  A chest X-ray.  A urine culture.  A blood culture. How is this treated? Treatment depends on the underlying cause and severity of your condition. Mild neutropenia may not require treatment. Treatment may include medicines, such as:  Antibiotic medicine given through an IV tube.  Antiviral medicines.  Antifungal medicines.  A medicine to increase neutrophil production (colony-stimulating factor). You may get this drug through an IV tube or by injection.  Steroids given through an IV tube. If an underlying condition is causing neutropenia, you may need treatment for that condition. If medicines you are taking are causing neutropenia, your health care provider may have you stop taking those medicines. Follow these instructions at home: Medicines   Take over-the-counter and prescription medicines only as told by your health care provider.  Get a seasonal flu shot (influenza vaccine). Lifestyle  Do not eat unpasteurized foods.Do not eat unwashed raw fruits or vegetables.  Avoid exposure to groups of people or children.  Avoid being around people who are sick.  Avoid being around dirt or dust, such as in construction areas or gardens.  Do not provide direct care for pets. Avoid animal droppings. Do not clean litter boxes and bird cages. Hygiene   Bathe daily.  Clean the area between the genitals and the anus (perineal area) after you urinate or have a bowel movement. If you are female, wipe from front to back.  Brush your teeth  with a soft toothbrush before and after meals.  Do not use a razor that has a blade. Use an electric razor to remove hair.  Wash your hands often. Make sure others who come in contact with you also wash their hands. If soap and water are not available, use hand  sanitizer. General instructions  Do not have sex unless your health care provider has approved.  Take actions to avoid cuts and burns. For example: ? Be cautious when you use knives. Always cut away from yourself. ? Keep knives in protective sheaths or guards when not in use. ? Use oven mitts when you cook with a hot stove, oven, or grill. ? Stand a safe distance away from open fires.  Avoid people who received a vaccine in the past 30 days if that vaccine contained a live version of the germ (live vaccine). You should not get a live vaccine. Common live vaccines are varicella, measles, mumps, and rubella.  Do not share food utensils.  Do not use tampons, enemas, or rectal suppositories unless your health care provider has approved.  Keep all appointments as told by your health care provider. This is important. Contact a health care provider if:  You have a fever.  You have chills or you start to shake.  You have: ? A sore throat. ? A warm, red, or tender area on your skin. ? A cough. ? Frequent or painful urination. ? Vaginal discharge or itching.  You develop: ? Sores in your mouth or anus. ? Swollen lymph nodes. ? Red streaks on the skin. ? A rash.  You feel: ? Nauseous or you vomit. ? Very fatigued. ? Short of breath. This information is not intended to replace advice given to you by your health care provider. Make sure you discuss any questions you have with your health care provider. Document Released: 06/06/2002 Document Revised: 08/11/2018 Document Reviewed: 06/27/2015 Elsevier Interactive Patient Education  2019 [employer] Inc[employer]  Dehydration, Adult  Dehydration is when there is not enough fluid or water in your body. This happens when you lose more fluids than you take in. Dehydration can range from mild to very bad. It should be treated right away to keep it from getting very bad. Symptoms of mild dehydration may include:  Thirst.  Dry lips.  Slightly dry  mouth.  Dry, warm skin.  Dizziness. Symptoms of moderate dehydration may include:  Very dry mouth.  Muscle cramps.  Dark pee (urine). Pee may be the color of tea.  Your body making less pee.  Your eyes making fewer tears.  Heartbeat that is uneven or faster than normal (palpitations).  Headache.  Light-headedness, especially when you stand up from sitting.  Fainting (syncope). Symptoms of very bad dehydration may include:  Changes in skin, such as: ? Cold and clammy skin. ? Blotchy (mottled) or pale skin. ? Skin that does not quickly return to normal after being lightly pinched and let go (poor skin turgor).  Changes in body fluids, such as: ? Feeling very thirsty. ? Your eyes making fewer tears. ? Not sweating when body temperature is high, such as in hot weather. ? Your body making very little pee.  Changes in vital signs, such as: ? Weak pulse. ? Pulse that is more than 100 beats a minute when you are sitting still. ? Fast breathing. ? Low blood pressure.  Other changes, such as: ? Sunken eyes. ? Cold hands and feet. ? Confusion. ? Lack of energy (lethargy). ? Trouble  waking up from sleep. ? Short-term weight loss. ? Unconsciousness. Follow these instructions at home:   If told by your doctor, drink an ORS: ? Make an ORS by using instructions on the package. ? Start by drinking small amounts, about  cup (120 mL) every 5-10 minutes. ? Slowly drink more until you have had the amount that your doctor said to have.  Drink enough clear fluid to keep your pee clear or pale yellow. If you were told to drink an ORS, finish the ORS first, then start slowly drinking clear fluids. Drink fluids such as: ? Water. Do not drink only water by itself. Doing that can make the salt (sodium) level in your body get too low (hyponatremia). ? Ice chips. ? Fruit juice that you have added water to (diluted). ? Low-calorie sports drinks.  Avoid: ? Alcohol. ? Drinks that  have a lot of sugar. These include high-calorie sports drinks, fruit juice that does not have water added, and soda. ? Caffeine. ? Foods that are greasy or have a lot of fat or sugar.  Take over-the-counter and prescription medicines only as told by your doctor.  Do not take salt tablets. Doing that can make the salt level in your body get too high (hypernatremia).  Eat foods that have minerals (electrolytes). Examples include bananas, oranges, potatoes, tomatoes, and spinach.  Keep all follow-up visits as told by your doctor. This is important. Contact a doctor if:  You have belly (abdominal) pain that: ? Gets worse. ? Stays in one area (localizes).  You have a rash.  You have a stiff neck.  You get angry or annoyed more easily than normal (irritability).  You are more sleepy than normal.  You have a harder time waking up than normal.  You feel: ? Weak. ? Dizzy. ? Very thirsty.  You have peed (urinated) only a small amount of very dark pee during 6-8 hours. Get help right away if:  You have symptoms of very bad dehydration.  You cannot drink fluids without throwing up (vomiting).  Your symptoms get worse with treatment.  You have a fever.  You have a very bad headache.  You are throwing up or having watery poop (diarrhea) and it: ? Gets worse. ? Does not go away.  You have blood or something green (bile) in your throw-up.  You have blood in your poop (stool). This may cause poop to look black and tarry.  You have not peed in 6-8 hours.  You pass out (faint).  Your heart rate when you are sitting still is more than 100 beats a minute.  You have trouble breathing. This information is not intended to replace advice given to you by your health care provider. Make sure you discuss any questions you have with your health care provider. Document Released: 10/11/2009 Document Revised: 07/04/2016 Document Reviewed: 02/08/2016 Elsevier Interactive Patient  Education  2019 [employer].

## 2019-01-19 NOTE — Progress Notes (Addendum)
Symptom Management Consult note [facility]  [phone] [phone]  Patient Care Team: Steele Sizer, MD as PCP - General (Family Medicine) Rubie Maid, MD as Consulting Physician (Obstetrics and Gynecology) Clent Jacks, RN as Registered Nurse   Name of the patient: Molly [name]  [id number]  12/20/1951   Date of visit: 01/19/2019  Diagnosis: Endometrial adenocarcinoma  Chief Complaint: weakness  Current Treatment: s/p cycle 4 carbo Taxol.  Last given on 01/14/2019.   Oncology History: Patient was last seen by primary medical oncologist Dr. Janese Banks on 01/10/2019 for routine follow-up.  At that visit, she was scheduled to begin radiation treatment the next day x5 weeks with a 1 week break followed by an additional 5 weeks to her para-aortic lymph nodes.  Overall she was feeling well and her appetite was good and she denied any vomiting.  She had mild tingling numbness in her bilateral fingertips which was stable.  Body aches had improved.   In the interim, she was seen by her primary care physician Dr. Bayard Males (01/11/19).  Chronic conditions appeared to be well controlled with current medications. Nothing new added.   Patient had CT simulation on 01/06/2019.  I do not believe she has started radiation.     Endometrial adenocarcinoma ([place])   10/28/2018 Cancer Staging    Staging form: Corpus Uteri - Carcinoma and Carcinosarcoma, AJCC 8th Edition - Clinical stage from 10/28/2018: FIGO Stage IIIC1 (cT1a, cN1a, cM0) - Signed by Sindy Guadeloupe, MD on 10/29/2018    10/29/2018 Initial Diagnosis    Endometrial adenocarcinoma ([place])    11/05/2018 -  Chemotherapy    The patient had palonosetron (ALOXI) injection 0.25 mg, 0.25 mg, Intravenous,  Once, 4 of 6 cycles Administration: 0.25 mg (11/05/2018), 0.25 mg (11/29/2018), 0.25 mg (12/20/2018), 0.25 mg (01/14/2019) pegfilgrastim (NEULASTA ONPRO KIT) injection 6 mg, 6 mg, Subcutaneous, Once, 2 of 2  cycles Administration: 6 mg (11/05/2018), 6 mg (11/29/2018) CARBOplatin (PARAPLATIN) 540 mg in sodium chloride 0.9 % 250 mL chemo infusion, 540 mg (100 % of original dose 540 mg), Intravenous,  Once, 4 of 6 cycles Dose modification:   (original dose 540 mg, Cycle 1) Administration: 540 mg (11/05/2018), 540 mg (11/29/2018), 540 mg (12/20/2018), 540 mg (01/14/2019) PACLitaxel (TAXOL) 348 mg in sodium chloride 0.9 % 500 mL chemo infusion (> 49m/m2), 175 mg/m2 = 348 mg, Intravenous,  Once, 4 of 6 cycles Dose modification: 150 mg/m2 (original dose 175 mg/m2, Cycle 4, Reason: Other (see comments), Comment: neuropathy) Administration: 348 mg (11/05/2018), 348 mg (11/29/2018), 348 mg (12/20/2018), 300 mg (01/14/2019)  for chemotherapy treatment.      Subjective Data:  ECOG: 1 - Symptomatic but completely ambulatory   Subjective:     Molly TIPPINGis a 68y.o. female who presents for evaluation of nausea, vomiting, and dehydration.  It has been present for 3 days.  Associated signs & symptoms:  ability to keep down some fluids and malaise and weakness The following portions of the patient's history were reviewed and updated as appropriate: allergies, current medications, past family history, past medical history, past social history, past surgical history and problem list. Review of Systems A comprehensive review of systems was negative except for: Constitutional: positive for anorexia, fatigue and malaise Respiratory: positive for cough Cardiovascular: positive for fatigue Musculoskeletal: positive for muscle weakness Neurological: positive for weakness       Objective:    BP 117/78 (BP Location: Right Arm, Patient Position: Sitting) Comment: standing bp 130/80  Pulse 64 Comment: standing pulse 68  Temp 98.5 F (36.9 C) (Tympanic)   Resp (!) 22   SpO2 96%  General appearance: alert, fatigued, no distress, moderately obese and pale Back: symmetric, no curvature. ROM normal. No CVA  tenderness. Lungs: clear to auscultation bilaterally Abdomen: soft, non-tender; bowel sounds normal; no masses,  no organomegaly Neurologic: Grossly normal    Assessment:     Weakness/malaise secondary to recent chemotherapy   Plan:    Antiemetics per medication orders. Dietary guidelines discussed. IV fluids per orders. Labs per orders.    Adenocarcinoma endometrial: s/p 4 cycles of carbotaxol.  Last given on 01/14/2019.  Plan is to give 3 additional treatments prior to initiating radiation per patient.  She will begin daily radiation for 10 to 11 weeks after.  There was some concern regarding a 67-monthbreak from chemotherapy while getting radiation so case discussed with Gyn-Onc who recommended  3 additional carbo/taxol tx prior to radiaion. Radiation being pushed back for approximately 8 weeks. Appt will be scheduled for 02/04/19 today for labs, assessment and tx.  Weakness/general malaise: Due to recent chemotherapy and dehydration.  Not drinking or eating a whole lot.  Recommend supplementation as needed including ensures and Pedialyte. Samples given.   Nausea: Likely due to recent chemotherapy.  Continue antiemetics as prescribed.  Will give antiemetics in clinic today.  Neutropenia: Patient did not receive growth factor hormone replacement.  ANC and white count trending down.  Discussed neutropenic precaution.  Continue to monitor. Discussed nadir.   Hypomagnesemia/hypokalemia: Replete.  Likely d/t dehydration.   Plan: Stat labs.  Hypokalemia and hypomagnesemia. Give 2 g magnesium IV. Give 20 mEq potassium IV. Give 1 L NaCl. Give 10 mg Decadron. Give 8 mg Zofran. Vital signs.  Not orthostatic.  Patient will return to clinic on Friday, 01/21/2019 for repeat labs and possible additional IV fluids and or electrolyte replacement.  May cancel appointment if she is feeling better.   Greater than 50% was spent in counseling and coordination of care with this patient including but  not limited to discussion of the relevant topics above (See A&P) including, but not limited to diagnosis and management of acute and chronic medical conditions.   JFaythe Casa NP 01/19/2019 3:24 PM

## 2019-01-19 NOTE — Telephone Encounter (Signed)
Daughter called reporting that patient is not doing well and would like for her to be evaluated today. She is "weak in her chest", not hardly eating, having pain and may need flds. Appointment given for today at 115 lab/ NP in [facility] Management [facility].

## 2019-01-20 ENCOUNTER — Ambulatory Visit: Payer: Medicare Other

## 2019-01-20 ENCOUNTER — Other Ambulatory Visit: Payer: Self-pay | Admitting: Family Medicine

## 2019-01-20 ENCOUNTER — Telehealth: Payer: Self-pay

## 2019-01-20 DIAGNOSIS — T451X5A Adverse effect of antineoplastic and immunosuppressive drugs, initial encounter: Principal | ICD-10-CM

## 2019-01-20 DIAGNOSIS — G620 Drug-induced polyneuropathy: Secondary | ICD-10-CM

## 2019-01-20 DIAGNOSIS — C541 Malignant neoplasm of endometrium: Secondary | ICD-10-CM

## 2019-01-20 DIAGNOSIS — D849 Immunodeficiency, unspecified: Secondary | ICD-10-CM

## 2019-01-20 DIAGNOSIS — D899 Disorder involving the immune mechanism, unspecified: Secondary | ICD-10-CM

## 2019-01-20 NOTE — Telephone Encounter (Signed)
Copied from [place] [phone]. Topic: General - Other >> Jan 20, 2019  1:37 PM Leward Quan A wrote: Reason for CRM: Patient and her insurance agent called to say that she would like some help getting a home health aide because she has cancer and cant do much for herself. She is asking for a referral to   [facility], [address]. [place] [address] Ph# [phone] or [phone]. Please send referral and give Ms Siebel a call at [phone]

## 2019-01-21 ENCOUNTER — Other Ambulatory Visit: Payer: Self-pay

## 2019-01-21 ENCOUNTER — Inpatient Hospital Stay (HOSPITAL_BASED_OUTPATIENT_CLINIC_OR_DEPARTMENT_OTHER): Payer: Medicare Other | Admitting: Oncology

## 2019-01-21 ENCOUNTER — Inpatient Hospital Stay: Payer: Medicare Other

## 2019-01-21 ENCOUNTER — Encounter: Payer: Self-pay | Admitting: Oncology

## 2019-01-21 ENCOUNTER — Ambulatory Visit: Payer: Medicare Other

## 2019-01-21 VITALS — BP 132/85 | HR 68 | Temp 97.7°F | Resp 18

## 2019-01-21 DIAGNOSIS — R112 Nausea with vomiting, unspecified: Secondary | ICD-10-CM | POA: Diagnosis not present

## 2019-01-21 DIAGNOSIS — D701 Agranulocytosis secondary to cancer chemotherapy: Secondary | ICD-10-CM | POA: Diagnosis not present

## 2019-01-21 DIAGNOSIS — E860 Dehydration: Secondary | ICD-10-CM | POA: Diagnosis not present

## 2019-01-21 DIAGNOSIS — R531 Weakness: Secondary | ICD-10-CM

## 2019-01-21 DIAGNOSIS — R110 Nausea: Secondary | ICD-10-CM | POA: Diagnosis not present

## 2019-01-21 DIAGNOSIS — E876 Hypokalemia: Secondary | ICD-10-CM

## 2019-01-21 DIAGNOSIS — C541 Malignant neoplasm of endometrium: Secondary | ICD-10-CM

## 2019-01-21 DIAGNOSIS — D638 Anemia in other chronic diseases classified elsewhere: Secondary | ICD-10-CM | POA: Diagnosis not present

## 2019-01-21 DIAGNOSIS — D709 Neutropenia, unspecified: Secondary | ICD-10-CM | POA: Diagnosis not present

## 2019-01-21 DIAGNOSIS — Z95828 Presence of other vascular implants and grafts: Secondary | ICD-10-CM

## 2019-01-21 DIAGNOSIS — Z5111 Encounter for antineoplastic chemotherapy: Secondary | ICD-10-CM | POA: Diagnosis not present

## 2019-01-21 DIAGNOSIS — G620 Drug-induced polyneuropathy: Secondary | ICD-10-CM | POA: Diagnosis not present

## 2019-01-21 LAB — COMPREHENSIVE METABOLIC PANEL
ALT: 13 U/L (ref 0–44)
AST: 15 U/L (ref 15–41)
Albumin: 3.8 g/dL (ref 3.5–5.0)
Alkaline Phosphatase: 60 U/L (ref 38–126)
Anion gap: 6 (ref 5–15)
BUN: 17 mg/dL (ref 8–23)
CHLORIDE: 102 mmol/L (ref 98–111)
CO2: 30 mmol/L (ref 22–32)
CREATININE: 0.69 mg/dL (ref 0.44–1.00)
Calcium: 9.2 mg/dL (ref 8.9–10.3)
GFR calc Af Amer: >60 mL/min (ref >60)
GFR calc non Af Amer: >60 mL/min (ref >60)
Glucose, Bld: 96 mg/dL (ref 70–99)
Potassium: 3.7 mmol/L (ref 3.5–5.1)
Sodium: 138 mmol/L (ref 135–145)
Total Bilirubin: 0.8 mg/dL (ref 0.3–1.2)
Total Protein: 6.8 g/dL (ref 6.5–8.1)

## 2019-01-21 LAB — CBC WITH DIFFERENTIAL/PLATELET
Abs Immature Granulocytes: 0.01 10*3/uL (ref 0.00–0.07)
Basophils Absolute: 0 10*3/uL (ref 0.0–0.1)
Basophils Relative: 0 %
Eosinophils Absolute: 0 10*3/uL (ref 0.0–0.5)
Eosinophils Relative: 1 %
HCT: 32.5 % — ABNORMAL LOW (ref 36.0–46.0)
Hemoglobin: 10.8 g/dL — ABNORMAL LOW (ref 12.0–15.0)
Immature Granulocytes: 0 %
LYMPHS PCT: 61 %
Lymphs Abs: 2.1 10*3/uL (ref 0.7–4.0)
MCH: 30.5 pg (ref 26.0–34.0)
MCHC: 33.2 g/dL (ref 30.0–36.0)
MCV: 91.8 fL (ref 80.0–100.0)
Monocytes Absolute: 0.1 10*3/uL (ref 0.1–1.0)
Monocytes Relative: 2 %
NRBC: 0 % (ref 0.0–0.2)
Neutro Abs: 1.2 10*3/uL — ABNORMAL LOW (ref 1.7–7.7)
Neutrophils Relative %: 36 %
Platelets: 228 10*3/uL (ref 150–400)
RBC: 3.54 MIL/uL — ABNORMAL LOW (ref 3.87–5.11)
RDW: 15.5 % (ref 11.5–15.5)
WBC: 3.4 10*3/uL — ABNORMAL LOW (ref 4.0–10.5)

## 2019-01-21 LAB — MAGNESIUM: Magnesium: 1.4 mg/dL — ABNORMAL LOW (ref 1.7–2.4)

## 2019-01-21 MED ORDER — MAGNESIUM SULFATE 4 GM/100ML IV SOLN
INTRAVENOUS | Status: AC
  Filled 2019-01-21: qty 100

## 2019-01-21 MED ORDER — SODIUM CHLORIDE 0.9% FLUSH
10.0000 mL | INTRAVENOUS | Status: DC | PRN
  Administered 2019-01-21: 10 mL via INTRAVENOUS
  Filled 2019-01-21: qty 10

## 2019-01-21 MED ORDER — DEXAMETHASONE SODIUM PHOSPHATE 10 MG/ML IJ SOLN
INTRAMUSCULAR | Status: AC
  Filled 2019-01-21: qty 1

## 2019-01-21 MED ORDER — HEPARIN SOD (PORK) LOCK FLUSH 100 UNIT/ML IV SOLN
500.0000 [IU] | Freq: Once | INTRAVENOUS | Status: AC
  Administered 2019-01-21: 500 [IU] via INTRAVENOUS

## 2019-01-21 MED ORDER — MAGNESIUM OXIDE 400 (241.3 MG) MG PO TABS: 400.0000 mg | ORAL_TABLET | Freq: Every day | ORAL | 0 refills | Status: DC

## 2019-01-21 MED ORDER — SODIUM CHLORIDE 0.9 % IV SOLN: 4.0000 g | Freq: Once | INTRAVENOUS | Status: DC

## 2019-01-21 MED ORDER — MAGNESIUM SULFATE 4 GM/100ML IV SOLN
4.0000 g | Freq: Once | INTRAVENOUS | Status: AC
  Administered 2019-01-21: 4 g via INTRAVENOUS

## 2019-01-21 MED ORDER — SODIUM CHLORIDE 0.9 % IV SOLN
Freq: Once | INTRAVENOUS | Status: AC
  Administered 2019-01-21: 11:00:00 via INTRAVENOUS
  Filled 2019-01-21: qty 250

## 2019-01-21 MED ORDER — DEXAMETHASONE SODIUM PHOSPHATE 10 MG/ML IJ SOLN
10.0000 mg | Freq: Once | INTRAMUSCULAR | Status: AC
  Administered 2019-01-21: 10 mg via INTRAVENOUS

## 2019-01-21 NOTE — Telephone Encounter (Signed)
Copied from [place] [phone]. Topic: General - Other >> Jan 20, 2019  1:37 PM Leward Quan A wrote: Reason for CRM: Patient and her insurance agent called to say that she would like some help getting a home health aide because she has cancer and cant do much for herself. She is asking for a referral to   [facility], [place]. [place] [address] Ph# [phone] or [phone]. Please send referral and give Ms Eyman a call at [phone]

## 2019-01-21 NOTE — Patient Instructions (Signed)
It was great to see you today.   I am glad you are feeling better.  Please call our clinic next week if you feel you need further fluids and or electrolyte replacement.  He was scheduled for follow-up with Dr. Janese Banks on 02/04/2019.  I would recommend taking magnesium supplementation for the next 7 days.  Consume 8 to 10 glasses of water daily.  Use supplementation as needed.  Samples provided.   Hypokalemia Hypokalemia means that the amount of potassium in the blood is lower than normal.Potassium is a chemical that helps regulate the amount of fluid in the body (electrolyte). It also stimulates muscle tightening (contraction) and helps nerves work properly.Normally, most of the body's potassium is inside of cells, and only a very small amount is in the blood. Because the amount in the blood is so small, minor changes to potassium levels in the blood can be life-threatening. What are the causes? This condition may be caused by:  Antibiotic medicine.  Diarrhea or vomiting. Taking too much of a medicine that helps you have a bowel movement (laxative) can cause diarrhea and lead to hypokalemia.  Chronic kidney disease (CKD).  Medicines that help the body get rid of excess fluid (diuretics).  Eating disorders, such as bulimia.  Low magnesium levels in the body.  Sweating a lot. What are the signs or symptoms? Symptoms of this condition include:  Weakness.  Constipation.  Fatigue.  Muscle cramps.  Mental confusion.  Skipped heartbeats or irregular heartbeat (palpitations).  Tingling or numbness. How is this diagnosed? This condition is diagnosed with a blood test. How is this treated? Hypokalemia can be treated by taking potassium supplements by mouth or adjusting the medicines that you take. Treatment may also include eating more foods that contain a lot of potassium. If your potassium level is very low, you may need to get potassium through an IV tube in one of your veins and  be monitored in the hospital. Follow these instructions at home:   Take over-the-counter and prescription medicines only as told by your health care provider. This includes vitamins and supplements.  Eat a healthy diet. A healthy diet includes fresh fruits and vegetables, whole grains, healthy fats, and lean proteins.  If instructed, eat more foods that contain a lot of potassium, such as: ? Nuts, such as peanuts and pistachios. ? Seeds, such as sunflower seeds and pumpkin seeds. ? Peas, lentils, and lima beans. ? Whole grain and bran cereals and breads. ? Fresh fruits and vegetables, such as apricots, avocado, bananas, cantaloupe, kiwi, oranges, tomatoes, asparagus, and potatoes. ? Orange juice. ? Tomato juice. ? Red meats. ? Yogurt.  Keep all follow-up visits as told by your health care provider. This is important. Contact a health care provider if:  You have weakness that gets worse.  You feel your heart pounding or racing.  You vomit.  You have diarrhea.  You have diabetes (diabetes mellitus) and you have trouble keeping your blood sugar (glucose) in your target range. Get help right away if:  You have chest pain.  You have shortness of breath.  You have vomiting or diarrhea that lasts for more than 2 days.  You faint. This information is not intended to replace advice given to you by your health care provider. Make sure you discuss any questions you have with your health care provider. Document Released: 12/15/2005 Document Revised: 08/02/2016 Document Reviewed: 08/02/2016 Elsevier Interactive Patient Education  2019 [employer].  Hypomagnesemia Hypomagnesemia is a condition in  which the level of magnesium in the blood is low. Magnesium is a mineral that is found in many foods. It is used in many different processes in the body. Hypomagnesemia can affect every organ in the body. In severe cases, it can cause life-threatening problems. What are the causes? This  condition may be caused by:  Not getting enough magnesium in your diet.  Malnutrition.  Problems with absorbing magnesium from the intestines.  Dehydration.  Alcohol abuse.  Vomiting.  Severe or chronic diarrhea.  Some medicines, including medicines that make you urinate more (diuretics).  Certain diseases, such as kidney disease, diabetes, celiac disease, and overactive thyroid. What are the signs or symptoms? Symptoms of this condition include:  Loss of appetite.  Nausea and vomiting.  Involuntary shaking or trembling of a body part (tremor).  Muscle weakness.  Tingling in the arms and legs.  Sudden tightening of muscles (muscle spasms).  Confusion.  Psychiatric issues, such as depression, irritability, or psychosis.  A feeling of fluttering of the heart.  Seizures. These symptoms are more severe if magnesium levels drop suddenly. How is this diagnosed? This condition may be diagnosed based on:  Your symptoms and medical history.  A physical exam.  Blood and urine tests. How is this treated? Treatment depends on the cause and the severity of the condition. It may be treated with:  A magnesium supplement. This can be taken in pill form. If the condition is severe, magnesium is usually given through an IV.  Changes to your diet. You may be directed to eat foods that have a lot of magnesium, such as green leafy vegetables, peas, beans, and nuts.  Stopping any intake of alcohol. Follow these instructions at home:      Make sure that your diet includes foods with magnesium. Foods that have a lot of magnesium in them include: ? Green leafy vegetables, such as spinach and broccoli. ? Beans and peas. ? Nuts and seeds, such as almonds and sunflower seeds. ? Whole grains, such as whole grain bread and fortified cereals.  Take magnesium supplements if your health care provider tells you to do that. Take them as directed.  Take over-the-counter and  prescription medicines only as told by your health care provider.  Have your magnesium levels monitored as told by your health care provider.  When you are active, drink fluids that contain electrolytes.  Avoid drinking alcohol.  Keep all follow-up visits as told by your health care provider. This is important. Contact a health care provider if:  You get worse instead of better.  Your symptoms return. Get help right away if you:  Develop severe muscle weakness.  Have trouble breathing.  Feel that your heart is racing. Summary  Hypomagnesemia is a condition in which the level of magnesium in the blood is low.  Hypomagnesemia can affect every organ in the body.  Treatment may include eating more foods that contain magnesium, taking magnesium supplements, and not drinking alcohol.  Have your magnesium levels monitored as told by your health care provider. This information is not intended to replace advice given to you by your health care provider. Make sure you discuss any questions you have with your health care provider. Document Released: 09/10/2005 Document Revised: 11/16/2017 Document Reviewed: 11/16/2017 Elsevier Interactive Patient Education  2019 [employer]  Dehydration, Adult  Dehydration is when there is not enough fluid or water in your body. This happens when you lose more fluids than you take in. Dehydration can range  from mild to very bad. It should be treated right away to keep it from getting very bad. Symptoms of mild dehydration may include:  Thirst.  Dry lips.  Slightly dry mouth.  Dry, warm skin.  Dizziness. Symptoms of moderate dehydration may include:  Very dry mouth.  Muscle cramps.  Dark pee (urine). Pee may be the color of tea.  Your body making less pee.  Your eyes making fewer tears.  Heartbeat that is uneven or faster than normal (palpitations).  Headache.  Light-headedness, especially when you stand up from  sitting.  Fainting (syncope). Symptoms of very bad dehydration may include:  Changes in skin, such as: ? Cold and clammy skin. ? Blotchy (mottled) or pale skin. ? Skin that does not quickly return to normal after being lightly pinched and let go (poor skin turgor).  Changes in body fluids, such as: ? Feeling very thirsty. ? Your eyes making fewer tears. ? Not sweating when body temperature is high, such as in hot weather. ? Your body making very little pee.  Changes in vital signs, such as: ? Weak pulse. ? Pulse that is more than 100 beats a minute when you are sitting still. ? Fast breathing. ? Low blood pressure.  Other changes, such as: ? Sunken eyes. ? Cold hands and feet. ? Confusion. ? Lack of energy (lethargy). ? Trouble waking up from sleep. ? Short-term weight loss. ? Unconsciousness. Follow these instructions at home:   If told by your doctor, drink an ORS: ? Make an ORS by using instructions on the package. ? Start by drinking small amounts, about  cup (120 mL) every 5-10 minutes. ? Slowly drink more until you have had the amount that your doctor said to have.  Drink enough clear fluid to keep your pee clear or pale yellow. If you were told to drink an ORS, finish the ORS first, then start slowly drinking clear fluids. Drink fluids such as: ? Water. Do not drink only water by itself. Doing that can make the salt (sodium) level in your body get too low (hyponatremia). ? Ice chips. ? Fruit juice that you have added water to (diluted). ? Low-calorie sports drinks.  Avoid: ? Alcohol. ? Drinks that have a lot of sugar. These include high-calorie sports drinks, fruit juice that does not have water added, and soda. ? Caffeine. ? Foods that are greasy or have a lot of fat or sugar.  Take over-the-counter and prescription medicines only as told by your doctor.  Do not take salt tablets. Doing that can make the salt level in your body get too high  (hypernatremia).  Eat foods that have minerals (electrolytes). Examples include bananas, oranges, potatoes, tomatoes, and spinach.  Keep all follow-up visits as told by your doctor. This is important. Contact a doctor if:  You have belly (abdominal) pain that: ? Gets worse. ? Stays in one area (localizes).  You have a rash.  You have a stiff neck.  You get angry or annoyed more easily than normal (irritability).  You are more sleepy than normal.  You have a harder time waking up than normal.  You feel: ? Weak. ? Dizzy. ? Very thirsty.  You have peed (urinated) only a small amount of very dark pee during 6-8 hours. Get help right away if:  You have symptoms of very bad dehydration.  You cannot drink fluids without throwing up (vomiting).  Your symptoms get worse with treatment.  You have a fever.  You have  a very bad headache.  You are throwing up or having watery poop (diarrhea) and it: ? Gets worse. ? Does not go away.  You have blood or something green (bile) in your throw-up.  You have blood in your poop (stool). This may cause poop to look black and tarry.  You have not peed in 6-8 hours.  You pass out (faint).  Your heart rate when you are sitting still is more than 100 beats a minute.  You have trouble breathing. This information is not intended to replace advice given to you by your health care provider. Make sure you discuss any questions you have with your health care provider. Document Released: 10/11/2009 Document Revised: 07/04/2016 Document Reviewed: 02/08/2016 Elsevier Interactive Patient Education  2019 [employer].

## 2019-01-21 NOTE — Progress Notes (Signed)
Symptom Management Consult note [facility]  [phone] [phone]  Patient Care Team: Steele Sizer, MD as PCP - General (Family Medicine) Rubie Maid, MD as Consulting Physician (Obstetrics and Gynecology) Clent Jacks, RN as Registered Nurse   Name of the patient: Molly [name]  [id number]  1951/01/28   Date of visit: 01/21/2019   Diagnosis- 1. Hypokalemia - Comprehensive metabolic panel; Future  2. Hypomagnesemia - magnesium sulfate IVPB 4 g 100 mL  3. Dehydration - CBC with Differential; Future - Comprehensive metabolic panel; Future - Magnesium; Future - 0.9 %  sodium chloride infusion   Chief complaint/ Reason for visit- Weakness  Heme/Onc history: Patient was last seen by primary medical oncologist Dr. Janese Banks on 01/10/2019 for routine follow-up.  At that visit, she was scheduled to begin radiation treatment the next day x5 weeks with a 1 week break followed by an additional 5 weeks of her care aortic lymph nodes.  She appeared to be feeling well and her appetite was good.  She denied any vomiting.  Had mild tingling/numbness in her bilateral fingertips which was stable.  Body aches had improved.  In the interim, she was seen by her PCP Dr. Bayard Males (01/11/2019).  Chronic conditions were addressed and medications were discussed.  No changes were made.  Had CT simulation on 01/06/2019.  Radiation on hold for 3 additional cycles of carbo/taxol per Gyn-Onc.  Seen in [facility] on 01/19/2019 by me for weakness and general malaise.  This was thought to be due to recent chemotherapy, nausea, lack of appetite and dehydration.  Labs revealed hypokalemia and hypomagnesemia.  She was given 2 g magnesium and 20 mEq potassium through her Port-A-Cath.  Given 1 L NaCl, 10 mg Decadron and 8 mg Zofran for intractable nausea without vomiting.  Patient felt better before discharge.  She was scheduled to return to clinic today for repeat labs and  possible IV fluids.  Interval history- Today, patient continues to complain of nausea, weakness and dehydration.  Patient felt improved after fluids on Wednesday.  She continues to push fluids and use supplements provided at last visit.  Unfortunately, she does not have an appetite and finds it very challenging to force food.  She is using her antiemetics as prescribed.  She denies any neurological complaints, fevers or illnesses.  She denies any easy bleeding or bruising.  Appetite has mildly improved.  She continues to have nausea but denies vomiting.  Denies constipation and diarrhea.  Denies any urinary complaints.  Patient's daughters encouraged her to return to clinic today for repeat labs and IV fluids prior to the weekend.  ECOG FS:1 - Symptomatic but completely ambulatory  Review of systems- Review of Systems  Constitutional: Positive for malaise/fatigue. Negative for chills, fever and weight loss.  HENT: Negative for congestion, ear pain and tinnitus.   Eyes: Negative.  Negative for blurred vision and double vision.  Respiratory: Negative.  Negative for cough, sputum production and shortness of breath.   Cardiovascular: Negative.  Negative for chest pain, palpitations and leg swelling.  Gastrointestinal: Positive for nausea. Negative for abdominal pain, constipation, diarrhea and vomiting.  Genitourinary: Negative for dysuria, frequency and urgency.  Musculoskeletal: Negative for back pain and falls.  Skin: Negative.  Negative for rash.  Neurological: Positive for weakness. Negative for headaches.  Endo/Heme/Allergies: Negative.  Does not bruise/bleed easily.  Psychiatric/Behavioral: Negative.  Negative for depression. The patient is not nervous/anxious and does not have insomnia.  Current treatment- S/p cycle 4 carbo/Taxol.  Last given on 01/14/2019.  Allergies  Allergen Reactions  . Ferumoxytol Anaphylaxis  . Nsaids Hives and Rash  . Statins Other (See Comments)    Joint  pains  . Victoza [Liraglutide] Other (See Comments)    pancreatitis  . Oxycodone Nausea And Vomiting  . Crestor [Rosuvastatin Calcium] Rash     Past Medical History:  Diagnosis Date  . Anxiety   . Asthma    history of asthma  . Cardiomyopathy ([place])   . Complication of anesthesia    tore hair out and made teeth rough  . Depression   . Endometrial cancer ([place]) 08/2018  . GERD (gastroesophageal reflux disease)    takes prilosec prn  . History of CVA (cerebrovascular accident) 12/10/2015  . Hyperlipidemia LDL goal <70 12/10/2015  . Hypertension   . Prediabetes 10/02/2017   A1c 6 in January 2018  . Stroke [facility]) 1990    no residual effects     Past Surgical History:  Procedure Laterality Date  . ABDOMINAL HYSTERECTOMY    . CHOLECYSTECTOMY    . COLONOSCOPY WITH PROPOFOL N/A 01/08/2017   Procedure: COLONOSCOPY WITH PROPOFOL;  Surgeon: [name], MD;  Location: [facility] ENDOSCOPY;  Service: Endoscopy;  Laterality: N/A;  . COLONOSCOPY WITH PROPOFOL N/A 05/05/2018   Procedure: COLONOSCOPY WITH PROPOFOL;  Surgeon: [name], MD;  Location[facility] ENDOSCOPY;  Service: Gastroenterology;  Laterality: N/A;  . HERNIA REPAIR  [id number]/[id number]   umbilical  . PORTACATH PLACEMENT N/A 11/04/2018   Procedure: INSERTION PORT-A-CATH;  Surgeon: Jules Husbands, MD;  Location: [facility] ORS;  Service: General;  Laterality: N/A;  . SENTINEL NODE BIOPSY N/A 10/06/2018   Procedure: SENTINEL NODE BIOPSY;  Surgeon: Mellody Drown, MD;  Location: [facility] ORS;  Service: Gynecology;  Laterality: N/A;  . UMBILICAL HERNIA REPAIR N/A 11/17/2017   Procedure: HERNIA REPAIR UMBILICAL ADULT;  Surgeon: Jules Husbands, MD;  Location: [facility] ORS;  Service: General;  Laterality: N/A;    Social History   Socioeconomic History  . Marital status: Divorced    Spouse name: Not on file  . Number of children: 2  . Years of education: some college  . Highest education level: 12th grade  Occupational History  . 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 Occupation: currently a Careers adviser  . Financial resource strain: Not hard at all  . Food insecurity:    Worry: Never true    Inability: Never true  . Transportation needs:    Medical: No    Non-medical: No  Tobacco Use  . Smoking status: Never Smoker  . Smokeless tobacco: Never Used  . Tobacco comment: smoking cessation materials not required  Substance and Sexual Activity  . Alcohol use: No    Alcohol/week: 0.0 standard drinks  . Drug use: No  . Sexual activity: Not Currently  Lifestyle  . Physical activity:    Days per week: 0 days    Minutes per session: 0 min  . Stress: Not at all  Relationships  . Social connections:    Talks on phone: More than three times a week    Gets together: Three times a week    Attends religious service: More than 4 times per year    Active member of club or organization: Yes    Attends meetings of clubs or organizations: More than 4 times per year    Relationship status: Divorced  .  Intimate partner violence:    Fear of current or ex partner: No    Emotionally abused: No    Physically abused: No    Forced sexual activity: No  Other Topics Concern  . Not on file  Social History Narrative  . Not on file    Family History  Problem Relation Age of Onset  . Stroke Mother   . Hypertension Mother   . Dementia Mother   . Alzheimer's disease Mother   . Gout Father   . Asthma Father   . Hypertension Father   . Dementia Father   . Healthy Sister   . Stroke Brother   . Alzheimer's disease Brother   . Healthy Daughter   . Hypertension Brother   . Healthy Brother   . Cancer Paternal Grandmother   . Healthy Sister   . Healthy Sister   . Hypertension Sister   . Hypertension Sister   . Stroke Brother   . Alzheimer's disease Brother   . Stroke Brother   . Hypertension Brother   . Stroke Brother   . Hypertension Brother   . Healthy Brother   . Healthy Brother   . Hypertension Daughter    . Breast cancer Neg Hx      Current Outpatient Medications:  .  acetaminophen (TYLENOL) 500 MG tablet, Take 2 tablets (1,000 mg total) by mouth every 6 (six) hours as needed for mild pain or moderate pain., Disp: 30 tablet, Rfl: 0 .  albuterol (PROVENTIL HFA;VENTOLIN HFA) 108 (90 Base) MCG/ACT inhaler, Inhale 2 puffs into the lungs every 4 (four) hours as needed for wheezing or shortness of breath., Disp: 1 Inhaler, Rfl: 0 .  dexamethasone (DECADRON) 4 MG tablet, Take 2 tablets (8 mg total) by mouth daily. Start the day after chemotherapy for 2 days., Disp: 30 tablet, Rfl: 1 .  diclofenac sodium (VOLTAREN) 1 % GEL, Apply 2 g topically 3 (three) times daily as needed (pain)., Disp: 100 g, Rfl: 1 .  escitalopram (LEXAPRO) 10 MG tablet, Take 1 tablet (10 mg total) by mouth daily., Disp: 90 tablet, Rfl: 0 .  ezetimibe (ZETIA) 10 MG tablet, Take 1 tablet (10 mg total) by mouth daily., Disp: 90 tablet, Rfl: 0 .  fluticasone furoate-vilanterol (BREO ELLIPTA) 100-25 MCG/INH AEPB, Inhale 1 puff into the lungs daily., Disp: 60 each, Rfl: 0 .  furosemide (LASIX) 40 MG tablet, Take 1 tablet (40 mg total) by mouth daily as needed for fluid., Disp: 90 tablet, Rfl: 0 .  ketoconazole (NIZORAL) 2 % cream, Apply 1 application topically daily as needed for irritation. On abdominal fold prn, Disp: 60 g, Rfl: 0 .  lidocaine-prilocaine (EMLA) cream, Apply to affected area once, Disp: 30 g, Rfl: 3 .  loratadine (CLARITIN) 10 MG tablet, Take 1 tablet by mouth daily., Disp: 30 tablet, Rfl: 5 .  LORazepam (ATIVAN) 0.5 MG tablet, Take 1 tablet (0.5 mg total) by mouth every 6 (six) hours as needed (Nausea or vomiting)., Disp: 30 tablet, Rfl: 0 .  morphine (MSIR) 15 MG tablet, Take 1 tablet (15 mg total) by mouth every 6 (six) hours as needed for severe pain., Disp: 20 tablet, Rfl: 0 .  olmesartan-hydrochlorothiazide (BENICAR HCT) 20-12.5 MG tablet, Take 1 tablet by mouth daily., Disp: 90 tablet, Rfl: 0 .  omeprazole  (PRILOSEC OTC) 20 MG tablet, Take 20 mg by mouth daily as needed., Disp: , Rfl:  .  ondansetron (ZOFRAN) 8 MG tablet, Take 1 tablet (8 mg total) by mouth 2 (  two) times daily as needed for refractory nausea / vomiting. Start on day 3 after chemo., Disp: 30 tablet, Rfl: 1 .  pregabalin (LYRICA) 50 MG capsule, Take 1 capsule (50 mg total) by mouth 2 (two) times daily., Disp: 60 capsule, Rfl: 0 .  prochlorperazine (COMPAZINE) 10 MG tablet, Take 10 mg by mouth every 6 (six) hours as needed for nausea or vomiting., Disp: , Rfl:  .  traMADol (ULTRAM) 50 MG tablet, Take 1 tablet (50 mg total) by mouth every 6 (six) hours as needed., Disp: 15 tablet, Rfl: 0 .  triamcinolone ointment (KENALOG) 0.5 %, Apply 1 application topically 2 (two) times daily., Disp: 30 g, Rfl: 0 .  magnesium oxide (MAG-OX) 400 (241.3 Mg) MG tablet, Take 1 tablet (400 mg total) by mouth daily. For hypomagnesemia, Disp: 30 tablet, Rfl: 0 No current facility-administered medications for this visit.   Facility-Administered Medications Ordered in Other Visits:  .  sodium chloride flush (NS) 0.9 % injection 10 mL, 10 mL, Intravenous, PRN, Sindy Guadeloupe, MD, 10 mL at 11/15/18 0915  Physical exam:  Vitals:   01/21/19 1148  BP: 132/85  Pulse: 68  Resp: 18  Temp: 97.7 F (36.5 C)  TempSrc: Tympanic   Physical Exam Constitutional:      Appearance: Normal appearance.  HENT:     Head: Normocephalic and atraumatic.     Mouth/Throat:     Mouth: Mucous membranes are dry.  Eyes:     Pupils: Pupils are equal, round, and reactive to light.  Neck:     Musculoskeletal: Normal range of motion.  Cardiovascular:     Rate and Rhythm: Normal rate and regular rhythm.     Heart sounds: Normal heart sounds. No murmur.  Pulmonary:     Effort: Pulmonary effort is normal.     Breath sounds: Normal breath sounds. No wheezing.  Abdominal:     General: Bowel sounds are normal. There is no distension.     Palpations: Abdomen is soft.      Tenderness: There is no abdominal tenderness.  Musculoskeletal: Normal range of motion.  Skin:    General: Skin is warm and dry.     Findings: No rash.  Neurological:     Mental Status: She is alert and oriented to person, place, and time.  Psychiatric:        Judgment: Judgment normal.      CMP Latest Ref Rng & Units 01/25/2019  Glucose 70 - 99 mg/dL 117(H)  BUN 8 - 23 mg/dL 13  Creatinine 0.44 - 1.00 mg/dL 0.84  Sodium 135 - 145 mmol/L 141  Potassium 3.5 - 5.1 mmol/L 3.5  Chloride 98 - 111 mmol/L 106  CO2 22 - 32 mmol/L 27  Calcium 8.9 - 10.3 mg/dL 8.8(L)  Total Protein 6.5 - 8.1 g/dL 6.5  Total Bilirubin 0.3 - 1.2 mg/dL 0.5  Alkaline Phos 38 - 126 U/L 61  AST 15 - 41 U/L 17  ALT 0 - 44 U/L 13   CBC Latest Ref Rng & Units 01/25/2019  WBC 4.0 - 10.5 K/uL 3.1(L)  Hemoglobin 12.0 - 15.0 g/dL 10.4(L)  Hematocrit 36.0 - 46.0 % 31.9(L)  Platelets 150 - 400 K/uL 232    No images are attached to the encounter.  No results found.  Assessment and plan- Patient is a 68 y.o. female resents for reevaluation and labs.  She continues to feel weak and dehydrated.  Endometrial adenocarcinoma: Status post 4 cycles of carbotaxol.  Last given  on 01/14/2019.  Plan is to give 3 additional treatments prior to initiating radiation treatment per patient.  She will begin daily radiation for 10 to 11 weeks after.  There was some concern regarding a 24-month break from chemotherapy while recieving radiation so case was discussed with Gyn-Onc who recommended 3 additional carbo/Taxol treatments prior to radiation.  Radiation is being pushed back for approximately 8 weeks.  She is scheduled to return to clinic on 02/04/2019 for labs, assessment and cycle 5 of carbotaxol.  Continued weakness/general malaise: D/t recent chemotherapy.  Electrolytes revealed hypomagnesemia and hypokalemia.  Replaced at last visit.  Given her appetite has not improved, she likely will require additional fluids and/or electrolyte  replacement.  See plan below.  Neutropenia: Directly related to chemotherapy.  Maintain neutropenic precautions.  She did not receive GFHR.  She denies fevers.  Nausea without vomiting: Continue antiemetics as prescribed.  She will receive IV antiemetics in clinic today.  Plan: Vital signs stable. We will recheck labs today. Give 4 g magnesium IV. Give 20 mEq potassium IV. Give 1 L NaCl. Give 10 mg Decadron. Give 8 mg Zofran. RTC PRN for repeat labs and possible IV fluids. She is scheduled to return to clinic on 02/04/2023 cycle 5 carbo/Taxol.   Visit Diagnosis 1. Hypokalemia   2. Hypomagnesemia   3. Dehydration     Patient expressed understanding and was in agreement with this plan. She also understands that She can call clinic at any time with any questions, concerns, or complaints.   Greater than 50% was spent in counseling and coordination of care with this patient including but not limited to discussion of the relevant topics above (See A&P) including, but not limited to diagnosis and management of acute and chronic medical conditions.   Thank you for allowing me to participate in the care of this very pleasant patient.    Jacquelin Hawking, NP [place] at [facility] Cell - [phone] Pager- [phone] 01/28/2019 2:31 PM

## 2019-01-24 ENCOUNTER — Ambulatory Visit: Payer: Medicare Other

## 2019-01-25 ENCOUNTER — Inpatient Hospital Stay (HOSPITAL_BASED_OUTPATIENT_CLINIC_OR_DEPARTMENT_OTHER): Payer: Medicare Other | Admitting: Nurse Practitioner

## 2019-01-25 ENCOUNTER — Inpatient Hospital Stay: Payer: Medicare Other

## 2019-01-25 ENCOUNTER — Ambulatory Visit: Payer: Medicare Other

## 2019-01-25 ENCOUNTER — Other Ambulatory Visit: Payer: Self-pay

## 2019-01-25 ENCOUNTER — Encounter: Payer: Self-pay | Admitting: Nurse Practitioner

## 2019-01-25 VITALS — BP 130/78 | HR 66 | Temp 98.3°F | Resp 18

## 2019-01-25 DIAGNOSIS — C541 Malignant neoplasm of endometrium: Secondary | ICD-10-CM

## 2019-01-25 DIAGNOSIS — R110 Nausea: Secondary | ICD-10-CM | POA: Diagnosis not present

## 2019-01-25 DIAGNOSIS — E876 Hypokalemia: Secondary | ICD-10-CM

## 2019-01-25 DIAGNOSIS — E860 Dehydration: Secondary | ICD-10-CM | POA: Diagnosis not present

## 2019-01-25 DIAGNOSIS — D709 Neutropenia, unspecified: Secondary | ICD-10-CM | POA: Diagnosis not present

## 2019-01-25 DIAGNOSIS — Z95828 Presence of other vascular implants and grafts: Secondary | ICD-10-CM

## 2019-01-25 DIAGNOSIS — G620 Drug-induced polyneuropathy: Secondary | ICD-10-CM | POA: Diagnosis not present

## 2019-01-25 DIAGNOSIS — R531 Weakness: Secondary | ICD-10-CM

## 2019-01-25 DIAGNOSIS — D701 Agranulocytosis secondary to cancer chemotherapy: Secondary | ICD-10-CM | POA: Diagnosis not present

## 2019-01-25 DIAGNOSIS — D638 Anemia in other chronic diseases classified elsewhere: Secondary | ICD-10-CM | POA: Diagnosis not present

## 2019-01-25 DIAGNOSIS — R112 Nausea with vomiting, unspecified: Secondary | ICD-10-CM | POA: Diagnosis not present

## 2019-01-25 DIAGNOSIS — Z5111 Encounter for antineoplastic chemotherapy: Secondary | ICD-10-CM | POA: Diagnosis not present

## 2019-01-25 LAB — CBC WITH DIFFERENTIAL/PLATELET
Abs Immature Granulocytes: 0.01 10*3/uL (ref 0.00–0.07)
BASOS PCT: 0 %
Basophils Absolute: 0 10*3/uL (ref 0.0–0.1)
Eosinophils Absolute: 0 10*3/uL (ref 0.0–0.5)
Eosinophils Relative: 1 %
HCT: 31.9 % — ABNORMAL LOW (ref 36.0–46.0)
Hemoglobin: 10.4 g/dL — ABNORMAL LOW (ref 12.0–15.0)
Immature Granulocytes: 0 %
Lymphocytes Relative: 41 %
Lymphs Abs: 1.3 10*3/uL (ref 0.7–4.0)
MCH: 30.1 pg (ref 26.0–34.0)
MCHC: 32.6 g/dL (ref 30.0–36.0)
MCV: 92.5 fL (ref 80.0–100.0)
Monocytes Absolute: 0.3 10*3/uL (ref 0.1–1.0)
Monocytes Relative: 10 %
Neutro Abs: 1.5 10*3/uL — ABNORMAL LOW (ref 1.7–7.7)
Neutrophils Relative %: 48 %
PLATELETS: 232 10*3/uL (ref 150–400)
RBC: 3.45 MIL/uL — AB (ref 3.87–5.11)
RDW: 15.8 % — ABNORMAL HIGH (ref 11.5–15.5)
WBC: 3.1 10*3/uL — ABNORMAL LOW (ref 4.0–10.5)
nRBC: 0 % (ref 0.0–0.2)

## 2019-01-25 LAB — COMPREHENSIVE METABOLIC PANEL
ALBUMIN: 3.7 g/dL (ref 3.5–5.0)
ALT: 13 U/L (ref 0–44)
AST: 17 U/L (ref 15–41)
Alkaline Phosphatase: 61 U/L (ref 38–126)
Anion gap: 8 (ref 5–15)
BUN: 13 mg/dL (ref 8–23)
CO2: 27 mmol/L (ref 22–32)
Calcium: 8.8 mg/dL — ABNORMAL LOW (ref 8.9–10.3)
Chloride: 106 mmol/L (ref 98–111)
Creatinine, Ser: 0.84 mg/dL (ref 0.44–1.00)
GFR calc Af Amer: >60 mL/min (ref >60)
GFR calc non Af Amer: >60 mL/min (ref >60)
Glucose, Bld: 117 mg/dL — ABNORMAL HIGH (ref 70–99)
Potassium: 3.5 mmol/L (ref 3.5–5.1)
Sodium: 141 mmol/L (ref 135–145)
Total Bilirubin: 0.5 mg/dL (ref 0.3–1.2)
Total Protein: 6.5 g/dL (ref 6.5–8.1)

## 2019-01-25 LAB — MAGNESIUM: Magnesium: 1.5 mg/dL — ABNORMAL LOW (ref 1.7–2.4)

## 2019-01-25 MED ORDER — HEPARIN SOD (PORK) LOCK FLUSH 100 UNIT/ML IV SOLN
500.0000 [IU] | Freq: Once | INTRAVENOUS | Status: AC
  Administered 2019-01-25: 500 [IU] via INTRAVENOUS
  Filled 2019-01-25: qty 5

## 2019-01-25 MED ORDER — SODIUM CHLORIDE 0.9 % IV SOLN: 4.0000 g | Freq: Once | INTRAVENOUS | Status: DC

## 2019-01-25 MED ORDER — MAGNESIUM OXIDE 400 (241.3 MG) MG PO TABS: 400.0000 mg | ORAL_TABLET | Freq: Every day | ORAL | 0 refills | Status: DC

## 2019-01-25 MED ORDER — SODIUM CHLORIDE 0.9 % IV SOLN
Freq: Once | INTRAVENOUS | Status: AC
  Administered 2019-01-25: 11:00:00 via INTRAVENOUS
  Filled 2019-01-25: qty 250

## 2019-01-25 MED ORDER — SODIUM CHLORIDE 0.9% FLUSH
10.0000 mL | INTRAVENOUS | Status: DC | PRN
  Administered 2019-01-25: 10 mL via INTRAVENOUS
  Filled 2019-01-25: qty 10

## 2019-01-25 MED ORDER — MAGNESIUM SULFATE 4 GM/100ML IV SOLN
4.0000 g | Freq: Once | INTRAVENOUS | Status: AC
  Administered 2019-01-25: 4 g via INTRAVENOUS
  Filled 2019-01-25: qty 100

## 2019-01-25 NOTE — Progress Notes (Signed)
[facility]  [phone]) [phone] [phone]) [phone]  Patient Care Team: Steele Sizer, MD as PCP - General (Family Medicine) Rubie Maid, MD as Consulting Physician (Obstetrics and Gynecology) Clent Jacks, RN as Registered Nurse   Name of the patient: Molly [name]  [id number]  March 26, 1951   Date of visit: 01/25/19  Diagnosis-endometrial cancer  Chief complaint/ Reason for visit- Weakness  Heme/Onc history:    Endometrial adenocarcinoma ([place])   10/28/2018 Cancer Staging    Staging form: Corpus Uteri - Carcinoma and Carcinosarcoma, AJCC 8th Edition - Clinical stage from 10/28/2018: FIGO Stage IIIC1 (cT1a, cN1a, cM0) - Signed by Sindy Guadeloupe, MD on 10/29/2018    10/29/2018 Initial Diagnosis    Endometrial adenocarcinoma ([place])    11/05/2018 -  Chemotherapy    The patient had palonosetron (ALOXI) injection 0.25 mg, 0.25 mg, Intravenous,  Once, 4 of 6 cycles Administration: 0.25 mg (11/05/2018), 0.25 mg (11/29/2018), 0.25 mg (12/20/2018), 0.25 mg (01/14/2019) pegfilgrastim (NEULASTA ONPRO KIT) injection 6 mg, 6 mg, Subcutaneous, Once, 2 of 2 cycles Administration: 6 mg (11/05/2018), 6 mg (11/29/2018) CARBOplatin (PARAPLATIN) 540 mg in sodium chloride 0.9 % 250 mL chemo infusion, 540 mg (100 % of original dose 540 mg), Intravenous,  Once, 4 of 6 cycles Dose modification:   (original dose 540 mg, Cycle 1) Administration: 540 mg (11/05/2018), 540 mg (11/29/2018), 540 mg (12/20/2018), 540 mg (01/14/2019) PACLitaxel (TAXOL) 348 mg in sodium chloride 0.9 % 500 mL chemo infusion (> 90m/m2), 175 mg/m2 = 348 mg, Intravenous,  Once, 4 of 6 cycles Dose modification: 150 mg/m2 (original dose 175 mg/m2, Cycle 4, Reason: Other (see comments), Comment: neuropathy) Administration: 348 mg (11/05/2018), 348 mg (11/29/2018), 348 mg (12/20/2018), 300 mg (01/14/2019)  for chemotherapy treatment.      Interval history-Molly [name], 68year old  female with above history of endometrial cancer, presents to symptom management clinic for weakness following chemotherapy.  She was seen in symptom management on 01/20/2020 and 01/21/2019 for intractable nausea and vomiting and weakness similar to today and was found to have low potassium and magnesium levels.  She received IV fluids, Decadron, Zofran, and electrolyte replacement which have consistently improved her symptoms.  Today, she continues to complain of nausea and weakness.  She has had poor oral intake but improving with use of home antiemetics.  She denies diarrhea, vomiting, fevers.  She says that she is feeling better but not yet at baseline.   ECOG FS:2 - Symptomatic, <50% confined to bed  Review of systems- Review of Systems  Constitutional: Positive for malaise/fatigue. Negative for chills and fever.  HENT: Negative.  Negative for congestion, nosebleeds and sore throat.   Eyes: Negative.  Negative for blurred vision, pain and redness.  Respiratory: Negative.   Cardiovascular: Negative.  Negative for chest pain, palpitations and leg swelling.  Gastrointestinal: Positive for nausea. Negative for abdominal pain, blood in stool, constipation, diarrhea, heartburn, melena and vomiting.  Genitourinary: Negative.   Musculoskeletal: Positive for myalgias. Negative for back pain, falls, joint pain and neck pain.  Skin: Negative.  Negative for rash.  Neurological: Positive for weakness. Negative for dizziness, tingling, sensory change, loss of consciousness and headaches.  Psychiatric/Behavioral: Positive for depression. The patient is not nervous/anxious and does not have insomnia.      Current treatment-carbo-Taxol on 01/14/2019  Allergies  Allergen Reactions  . Ferumoxytol Anaphylaxis  . Nsaids Hives and Rash  . Statins Other (See Comments)    Joint pains  . Victoza [Liraglutide]  Other (See Comments)    pancreatitis  . Oxycodone Nausea And Vomiting  . Crestor [Rosuvastatin Calcium]  Rash    Past Medical History:  Diagnosis Date  . Anxiety   . Asthma    history of asthma  . Cardiomyopathy ([place])   . Complication of anesthesia    tore hair out and made teeth rough  . Depression   . Endometrial cancer ([place]) 08/2018  . GERD (gastroesophageal reflux disease)    takes prilosec prn  . History of CVA (cerebrovascular accident) 12/10/2015  . Hyperlipidemia LDL goal <70 12/10/2015  . Hypertension   . Prediabetes 10/02/2017   A1c 6 in January 2018  . Stroke [facility]) 1990    no residual effects    Past Surgical History:  Procedure Laterality Date  . ABDOMINAL HYSTERECTOMY    . CHOLECYSTECTOMY    . COLONOSCOPY WITH PROPOFOL N/A 01/08/2017   Procedure: COLONOSCOPY WITH PROPOFOL;  Surgeon: [name], MD;  Location: [facility] ENDOSCOPY;  Service: Endoscopy;  Laterality: N/A;  . COLONOSCOPY WITH PROPOFOL N/A 05/05/2018   Procedure: COLONOSCOPY WITH PROPOFOL;  Surgeon: [name], MD;  Location: [facility] ENDOSCOPY;  Service: Gastroenterology;  Laterality: N/A;  . HERNIA REPAIR  76/1470   umbilical  . PORTACATH PLACEMENT N/A 11/04/2018   Procedure: INSERTION PORT-A-CATH;  Surgeon: Jules Husbands, MD;  Location: [facility] ORS;  Service: General;  Laterality: N/A;  . SENTINEL NODE BIOPSY N/A 10/06/2018   Procedure: SENTINEL NODE BIOPSY;  Surgeon: Mellody Drown, MD;  Location: [facility] ORS;  Service: Gynecology;  Laterality: N/A;  . UMBILICAL HERNIA REPAIR N/A 11/17/2017   Procedure: HERNIA REPAIR UMBILICAL ADULT;  Surgeon: Jules Husbands, MD;  Location: [facility] ORS;  Service: General;  Laterality: N/A;    Social History   Socioeconomic History  . Marital status: Divorced    Spouse name: Not on file  . Number of children: 2  . Years of education: some college  . Highest education level: 12th grade  Occupational History  . 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 Occupation: currently a Careers adviser  . Financial resource strain: Not hard at all  . Food  insecurity:    Worry: Never true    Inability: Never true  . Transportation needs:    Medical: No    Non-medical: No  Tobacco Use  . Smoking status: Never Smoker  . Smokeless tobacco: Never Used  . Tobacco comment: smoking cessation materials not required  Substance and Sexual Activity  . Alcohol use: No    Alcohol/week: 0.0 standard drinks  . Drug use: No  . Sexual activity: Not Currently  Lifestyle  . Physical activity:    Days per week: 0 days    Minutes per session: 0 min  . Stress: Not at all  Relationships  . Social connections:    Talks on phone: More than three times a week    Gets together: Three times a week    Attends religious service: More than 4 times per year    Active member of club or organization: Yes    Attends meetings of clubs or organizations: More than 4 times per year    Relationship status: Divorced  . Intimate partner violence:    Fear of current or ex partner: No    Emotionally abused: No    Physically abused: No    Forced sexual activity: No  Other Topics Concern  . Not on file  Social  History Narrative  . Not on file    Family History  Problem Relation Age of Onset  . Stroke Mother   . Hypertension Mother   . Dementia Mother   . Alzheimer's disease Mother   . Gout Father   . Asthma Father   . Hypertension Father   . Dementia Father   . Healthy Sister   . Stroke Brother   . Alzheimer's disease Brother   . Healthy Daughter   . Hypertension Brother   . Healthy Brother   . Cancer Paternal Grandmother   . Healthy Sister   . Healthy Sister   . Hypertension Sister   . Hypertension Sister   . Stroke Brother   . Alzheimer's disease Brother   . Stroke Brother   . Hypertension Brother   . Stroke Brother   . Hypertension Brother   . Healthy Brother   . Healthy Brother   . Hypertension Daughter   . Breast cancer Neg Hx      Current Outpatient Medications:  .  acetaminophen (TYLENOL) 500 MG tablet, Take 2 tablets (1,000 mg  total) by mouth every 6 (six) hours as needed for mild pain or moderate pain., Disp: 30 tablet, Rfl: 0 .  albuterol (PROVENTIL HFA;VENTOLIN HFA) 108 (90 Base) MCG/ACT inhaler, Inhale 2 puffs into the lungs every 4 (four) hours as needed for wheezing or shortness of breath., Disp: 1 Inhaler, Rfl: 0 .  dexamethasone (DECADRON) 4 MG tablet, Take 2 tablets (8 mg total) by mouth daily. Start the day after chemotherapy for 2 days., Disp: 30 tablet, Rfl: 1 .  diclofenac sodium (VOLTAREN) 1 % GEL, Apply 2 g topically 3 (three) times daily as needed (pain)., Disp: 100 g, Rfl: 1 .  escitalopram (LEXAPRO) 10 MG tablet, Take 1 tablet (10 mg total) by mouth daily., Disp: 90 tablet, Rfl: 0 .  ezetimibe (ZETIA) 10 MG tablet, Take 1 tablet (10 mg total) by mouth daily., Disp: 90 tablet, Rfl: 0 .  fluticasone furoate-vilanterol (BREO ELLIPTA) 100-25 MCG/INH AEPB, Inhale 1 puff into the lungs daily., Disp: 60 each, Rfl: 0 .  furosemide (LASIX) 40 MG tablet, Take 1 tablet (40 mg total) by mouth daily as needed for fluid., Disp: 90 tablet, Rfl: 0 .  ketoconazole (NIZORAL) 2 % cream, Apply 1 application topically daily as needed for irritation. On abdominal fold prn, Disp: 60 g, Rfl: 0 .  lidocaine-prilocaine (EMLA) cream, Apply to affected area once, Disp: 30 g, Rfl: 3 .  loratadine (CLARITIN) 10 MG tablet, Take 1 tablet by mouth daily., Disp: 30 tablet, Rfl: 5 .  LORazepam (ATIVAN) 0.5 MG tablet, Take 1 tablet (0.5 mg total) by mouth every 6 (six) hours as needed (Nausea or vomiting)., Disp: 30 tablet, Rfl: 0 .  magnesium oxide (MAG-OX) 400 (241.3 Mg) MG tablet, Take 1 tablet (400 mg total) by mouth daily., Disp: 10 tablet, Rfl: 0 .  morphine (MSIR) 15 MG tablet, Take 1 tablet (15 mg total) by mouth every 6 (six) hours as needed for severe pain., Disp: 20 tablet, Rfl: 0 .  olmesartan-hydrochlorothiazide (BENICAR HCT) 20-12.5 MG tablet, Take 1 tablet by mouth daily., Disp: 90 tablet, Rfl: 0 .  omeprazole (PRILOSEC OTC)  20 MG tablet, Take 20 mg by mouth daily as needed., Disp: , Rfl:  .  ondansetron (ZOFRAN) 8 MG tablet, Take 1 tablet (8 mg total) by mouth 2 (two) times daily as needed for refractory nausea / vomiting. Start on day 3 after chemo., Disp: 30 tablet,  Rfl: 1 .  pregabalin (LYRICA) 50 MG capsule, Take 1 capsule (50 mg total) by mouth 2 (two) times daily., Disp: 60 capsule, Rfl: 0 .  prochlorperazine (COMPAZINE) 10 MG tablet, Take 10 mg by mouth every 6 (six) hours as needed for nausea or vomiting., Disp: , Rfl:  .  traMADol (ULTRAM) 50 MG tablet, Take 1 tablet (50 mg total) by mouth every 6 (six) hours as needed., Disp: 15 tablet, Rfl: 0 .  triamcinolone ointment (KENALOG) 0.5 %, Apply 1 application topically 2 (two) times daily., Disp: 30 g, Rfl: 0 No current facility-administered medications for this visit.   Facility-Administered Medications Ordered in Other Visits:  .  heparin lock flush 100 unit/mL, 500 Units, Intravenous, Once, Verlon Au, NP .  magnesium sulfate IVPB 4 g 100 mL, 4 g, Intravenous, Once, [name] E, NP, Last Rate: 50 mL/hr at 01/25/19 1106, 4 g at 01/25/19 1106 .  sodium chloride flush (NS) 0.9 % injection 10 mL, 10 mL, Intravenous, PRN, Sindy Guadeloupe, MD, 10 mL at 11/15/18 0915 .  sodium chloride flush (NS) 0.9 % injection 10 mL, 10 mL, Intravenous, PRN, Verlon Au, NP, 10 mL at 01/25/19 1035  Physical exam:  Vitals:   01/25/19 1018  BP: 130/78  Pulse: 66  Resp: 18  Temp: 98.3 F (36.8 C)  TempSrc: Tympanic   Physical Exam Vitals signs reviewed.  Constitutional:      General: She is not in acute distress.    Comments: Fatigued appearing.  Laying in recliner, wrapped in blankets.  Accompanied by daughter.  HENT:     Head: Normocephalic and atraumatic.     Mouth/Throat:     Mouth: Mucous membranes are moist.     Pharynx: Oropharynx is clear. No oropharyngeal exudate.  Eyes:     General: No scleral icterus.    Conjunctiva/sclera: Conjunctivae  normal.  Neck:     Musculoskeletal: Neck supple. No muscular tenderness.  Cardiovascular:     Rate and Rhythm: Normal rate and regular rhythm.  Pulmonary:     Effort: Pulmonary effort is normal. No respiratory distress.     Breath sounds: Normal breath sounds.  Abdominal:     General: There is no distension.     Palpations: Abdomen is soft.     Tenderness: There is no guarding or rebound.  Musculoskeletal:     Right lower leg: No edema.     Left lower leg: No edema.  Skin:    General: Skin is warm and dry.  Neurological:     Mental Status: She is oriented to person, place, and time.  Psychiatric:        Mood and Affect: Mood normal.        Behavior: Behavior normal.      CMP Latest Ref Rng & Units 01/25/2019  Glucose 70 - 99 mg/dL 117(H)  BUN 8 - 23 mg/dL 13  Creatinine 0.44 - 1.00 mg/dL 0.84  Sodium 135 - 145 mmol/L 141  Potassium 3.5 - 5.1 mmol/L 3.5  Chloride 98 - 111 mmol/L 106  CO2 22 - 32 mmol/L 27  Calcium 8.9 - 10.3 mg/dL 8.8(L)  Total Protein 6.5 - 8.1 g/dL 6.5  Total Bilirubin 0.3 - 1.2 mg/dL 0.5  Alkaline Phos 38 - 126 U/L 61  AST 15 - 41 U/L 17  ALT 0 - 44 U/L 13   CBC Latest Ref Rng & Units 01/25/2019  WBC 4.0 - 10.5 K/uL 3.1(L)  Hemoglobin 12.0 - 15.0  g/dL 10.4(L)  Hematocrit 36.0 - 46.0 % 31.9(L)  Platelets 150 - 400 K/uL 232    No images are attached to the encounter.  No results found.  Assessment and plan- Patient is a 68 y.o. female diagnosed with endometrial cancer who presents to symptom management clinic for weakness.  Labs in clinic today independently reviewed by myself and discussed with patient.  1.  Endometrial adenocarcinoma- s/p TLH-BSO with LN biopsies with Dr. Fransisca Connors.  Pathology-endometrioid adenocarcinoma FIGO grade 2 with 33% myometrial invasion.  LVSI present.  Peritoneal ascites atypical.  2 of 3 sentinel lymph nodes positive for macro metastases.  FIGO 3C1. Case discussed at multidisciplinary case conference with recommendation  for 3 cycles of adjuvant carbo-Taxol followed by radiation followed by 3 additional cycles of carbo-Taxol.  Per note, Dr. Baruch Gouty, plans radiation to pelvic and periaortic lymph nodes and boost to the vaginal cuff.  Initiated chemotherapy on 11/05/2018, completed 3 cycles on 12/20/2018; reinitiated on 01/14/2019.  Overall tolerating treatment fair to moderate.   2. Hypokalemia-potassium 3.5 today.  No diarrhea or vomiting.  Encouraged p.o. intake. Patient unsure if she is taking supplementation at home. Has previously been prescribed but not currently on med list.  3.  Hypomagnesemia-continues to be low.  1.5 today.  We will give replacement in clinic IV today and start on oral magnesium.  Discussed variety of formulations to assess tolerability.   4.  Neutropenia-chemotherapy-induced.  WBC 3.1, ANC 1.5.  Improved.  No fever.  Continue to monitor.  5.  Malaise and weakness following chemotherapy-suspect related to electrolyte abnormalities and poor oral intake.  IV fluids given in clinic today with significant improvement in symptoms.  Tolerating p.o.'s.  6.  Chemotherapy-induced nausea-previously intractable nausea and vomiting.  Vomiting has resolved.  Continue home antiemetics.  May also consider adding olanzapine if persistent symptoms.  Patient feeling significantly improved after infusion today.  Will not schedule additional fluids at this time and she can call if symptomatic.  Follow-up with Dr. Janese Banks as scheduled.    Visit Diagnosis 1. Endometrial adenocarcinoma ([place])   2. Hypokalemia   3. Hypomagnesemia     Patient expressed understanding and was in agreement with this plan. She also understands that She can call clinic at any time with any questions, concerns, or complaints.   Thank you for allowing me to participate in the care of this very pleasant patient.   Beckey Rutter, DNP, AGNP-C [place] [facility] [phone] (work cell) [phone] (office)

## 2019-01-26 ENCOUNTER — Ambulatory Visit: Payer: Medicare Other

## 2019-01-27 ENCOUNTER — Ambulatory Visit: Payer: Medicare Other

## 2019-01-28 ENCOUNTER — Ambulatory Visit: Payer: Medicare Other

## 2019-01-31 ENCOUNTER — Ambulatory Visit: Payer: Medicare Other

## 2019-01-31 ENCOUNTER — Ambulatory Visit: Payer: Medicare Other | Admitting: Oncology

## 2019-01-31 ENCOUNTER — Other Ambulatory Visit: Payer: Medicare Other

## 2019-02-01 ENCOUNTER — Other Ambulatory Visit: Payer: Self-pay

## 2019-02-01 ENCOUNTER — Telehealth: Payer: Self-pay | Admitting: *Deleted

## 2019-02-01 ENCOUNTER — Ambulatory Visit: Payer: Medicare Other

## 2019-02-01 DIAGNOSIS — G620 Drug-induced polyneuropathy: Secondary | ICD-10-CM

## 2019-02-01 DIAGNOSIS — T451X5A Adverse effect of antineoplastic and immunosuppressive drugs, initial encounter: Principal | ICD-10-CM

## 2019-02-01 NOTE — Telephone Encounter (Signed)
Refill request for general medication: Lyrica 50 mg  Last office visit: 01/11/2019  Last physical exam: 04/06/2018  Follow-ups on file. 05/13/2019

## 2019-02-01 NOTE — Telephone Encounter (Signed)
Patient's daughter called to report that she is feeling weak and fatigued, and requested appointment for tomorrow to see NP in the symptom management clinic and get iv fluids. Appointment made and accepted for 9:30 tomorrow.   dhs

## 2019-02-01 NOTE — Telephone Encounter (Signed)
Sounds great!

## 2019-02-02 ENCOUNTER — Inpatient Hospital Stay: Payer: Medicare Other | Attending: Oncology | Admitting: Oncology

## 2019-02-02 ENCOUNTER — Telehealth: Payer: Self-pay | Admitting: Family Medicine

## 2019-02-02 ENCOUNTER — Other Ambulatory Visit: Payer: Self-pay

## 2019-02-02 ENCOUNTER — Inpatient Hospital Stay: Payer: Medicare Other

## 2019-02-02 ENCOUNTER — Encounter: Payer: Self-pay | Admitting: Oncology

## 2019-02-02 ENCOUNTER — Ambulatory Visit: Payer: Medicare Other

## 2019-02-02 VITALS — BP 132/85 | HR 64 | Temp 97.2°F | Resp 18

## 2019-02-02 DIAGNOSIS — J111 Influenza due to unidentified influenza virus with other respiratory manifestations: Secondary | ICD-10-CM

## 2019-02-02 DIAGNOSIS — Z95828 Presence of other vascular implants and grafts: Secondary | ICD-10-CM

## 2019-02-02 DIAGNOSIS — R69 Illness, unspecified: Secondary | ICD-10-CM

## 2019-02-02 DIAGNOSIS — R110 Nausea: Secondary | ICD-10-CM

## 2019-02-02 DIAGNOSIS — R531 Weakness: Secondary | ICD-10-CM | POA: Diagnosis not present

## 2019-02-02 DIAGNOSIS — J020 Streptococcal pharyngitis: Secondary | ICD-10-CM

## 2019-02-02 DIAGNOSIS — C541 Malignant neoplasm of endometrium: Secondary | ICD-10-CM

## 2019-02-02 DIAGNOSIS — M898X9 Other specified disorders of bone, unspecified site: Secondary | ICD-10-CM | POA: Diagnosis not present

## 2019-02-02 DIAGNOSIS — Z5111 Encounter for antineoplastic chemotherapy: Secondary | ICD-10-CM | POA: Insufficient documentation

## 2019-02-02 DIAGNOSIS — R5383 Other fatigue: Secondary | ICD-10-CM

## 2019-02-02 DIAGNOSIS — E876 Hypokalemia: Secondary | ICD-10-CM | POA: Diagnosis not present

## 2019-02-02 DIAGNOSIS — E860 Dehydration: Secondary | ICD-10-CM

## 2019-02-02 LAB — CBC WITH DIFFERENTIAL/PLATELET
Abs Immature Granulocytes: 0.02 10*3/uL (ref 0.00–0.07)
Basophils Absolute: 0 10*3/uL (ref 0.0–0.1)
Basophils Relative: 0 %
EOS PCT: 1 %
Eosinophils Absolute: 0 10*3/uL (ref 0.0–0.5)
HCT: 32.3 % — ABNORMAL LOW (ref 36.0–46.0)
HEMOGLOBIN: 10.6 g/dL — AB (ref 12.0–15.0)
Immature Granulocytes: 1 %
Lymphocytes Relative: 35 %
Lymphs Abs: 1.4 10*3/uL (ref 0.7–4.0)
MCH: 30.4 pg (ref 26.0–34.0)
MCHC: 32.8 g/dL (ref 30.0–36.0)
MCV: 92.6 fL (ref 80.0–100.0)
Monocytes Absolute: 0.4 10*3/uL (ref 0.1–1.0)
Monocytes Relative: 10 %
Neutro Abs: 2.1 10*3/uL (ref 1.7–7.7)
Neutrophils Relative %: 53 %
Platelets: 200 10*3/uL (ref 150–400)
RBC: 3.49 MIL/uL — ABNORMAL LOW (ref 3.87–5.11)
RDW: 15.2 % (ref 11.5–15.5)
WBC: 3.9 10*3/uL — ABNORMAL LOW (ref 4.0–10.5)
nRBC: 0 % (ref 0.0–0.2)

## 2019-02-02 LAB — COMPREHENSIVE METABOLIC PANEL
ALK PHOS: 78 U/L (ref 38–126)
ALT: 16 U/L (ref 0–44)
AST: 19 U/L (ref 15–41)
Albumin: 3.8 g/dL (ref 3.5–5.0)
Anion gap: 4 — ABNORMAL LOW (ref 5–15)
BILIRUBIN TOTAL: 0.4 mg/dL (ref 0.3–1.2)
BUN: 11 mg/dL (ref 8–23)
CALCIUM: 8.9 mg/dL (ref 8.9–10.3)
CO2: 31 mmol/L (ref 22–32)
Chloride: 104 mmol/L (ref 98–111)
Creatinine, Ser: 0.62 mg/dL (ref 0.44–1.00)
GFR calc Af Amer: >60 mL/min (ref >60)
GFR calc non Af Amer: >60 mL/min (ref >60)
Glucose, Bld: 99 mg/dL (ref 70–99)
Potassium: 3.5 mmol/L (ref 3.5–5.1)
Sodium: 139 mmol/L (ref 135–145)
Total Protein: 6.8 g/dL (ref 6.5–8.1)

## 2019-02-02 LAB — GROUP A STREP BY PCR: Group A Strep by PCR: NOT DETECTED

## 2019-02-02 LAB — MAGNESIUM: Magnesium: 1.6 mg/dL — ABNORMAL LOW (ref 1.7–2.4)

## 2019-02-02 LAB — INFLUENZA PANEL BY PCR (TYPE A & B)
Influenza A By PCR: NEGATIVE
Influenza B By PCR: NEGATIVE

## 2019-02-02 MED ORDER — DEXAMETHASONE SODIUM PHOSPHATE 10 MG/ML IJ SOLN
10.0000 mg | Freq: Once | INTRAMUSCULAR | Status: AC
  Administered 2019-02-02: 10 mg via INTRAVENOUS
  Filled 2019-02-02: qty 1

## 2019-02-02 MED ORDER — ONDANSETRON HCL 4 MG/2ML IJ SOLN
8.0000 mg | Freq: Once | INTRAMUSCULAR | Status: AC
  Administered 2019-02-02: 8 mg via INTRAVENOUS
  Filled 2019-02-02: qty 4

## 2019-02-02 MED ORDER — HEPARIN SOD (PORK) LOCK FLUSH 100 UNIT/ML IV SOLN
500.0000 [IU] | Freq: Once | INTRAVENOUS | Status: AC
  Administered 2019-02-02: 500 [IU] via INTRAVENOUS

## 2019-02-02 MED ORDER — SODIUM CHLORIDE 0.9% FLUSH
10.0000 mL | INTRAVENOUS | Status: DC | PRN
  Administered 2019-02-02: 10 mL via INTRAVENOUS
  Filled 2019-02-02: qty 10

## 2019-02-02 MED ORDER — PREGABALIN 50 MG PO CAPS: 50.0000 mg | ORAL_CAPSULE | Freq: Two times a day (BID) | ORAL | 1 refills | Status: DC

## 2019-02-02 MED ORDER — SODIUM CHLORIDE 0.9 % IV SOLN
Freq: Once | INTRAVENOUS | Status: AC
  Administered 2019-02-02: 10:00:00 via INTRAVENOUS
  Filled 2019-02-02: qty 250

## 2019-02-02 NOTE — Progress Notes (Signed)
Symptom Management Consult note [facility]  [phone] [phone]  Patient Care Team: Steele Sizer, MD as PCP - General (Family Medicine) Rubie Maid, MD as Consulting Physician (Obstetrics and Gynecology) Clent Jacks, RN as Registered Nurse   Name of the patient: Molly [name]  [id number]  16-Nov-1951   Date of visit: 02/02/2019  Diagnosis: Endometrial adenocarcinoma  Chief Complaint: Weakness  Current Treatment: s/p cycle 4 carbo/Taxol.  Last given on 01/14/2019.   Oncology History: Patient was last seen by primary medical oncologist Dr. Janese Banks on 01/10/2019 for routine follow-up.  At that visit, she was scheduled to begin radiation treatment the next day x5 weeks with a 1 week break followed by an additional 5 weeks of radiation.  She appeared to be feeling well and her appetite remained good.  She denied vomiting.  Had mild tingling/numbness in her bilateral fingertips which was stable.  Body aches had improved.  In the interim, she was seen by her PCP Dr. Bayard Males (01/11/2019).  Chronic conditions were addressed and medications discussed.  No changes were made.  Had CT simulation on 01/06/2019.  Radiation hold d/t the addition of 3 cycles of carbo/Taxol per Gyn-Onc.  Seen in [facility] on 01/19/2019 by me for weakness and general malaise and again on 01/21/2019.  Thought to be due to recent chemotherapy, nausea, lack of appetite and dehydration.  Labs revealed hypokalemia and hypomagnesia.  She was given electrolyte replacement and IV fluids via Port-A-Cath.  Symptoms improved.    Endometrial adenocarcinoma ([place])   10/28/2018 Cancer Staging    Staging form: Corpus Uteri - Carcinoma and Carcinosarcoma, AJCC 8th Edition - Clinical stage from 10/28/2018: FIGO Stage IIIC1 (cT1a, cN1a, cM0) - Signed by Sindy Guadeloupe, MD on 10/29/2018    10/29/2018 Initial Diagnosis    Endometrial adenocarcinoma ([place])    11/05/2018 -  Chemotherapy    The  patient had palonosetron (ALOXI) injection 0.25 mg, 0.25 mg, Intravenous,  Once, 4 of 6 cycles Administration: 0.25 mg (11/05/2018), 0.25 mg (11/29/2018), 0.25 mg (12/20/2018), 0.25 mg (01/14/2019) pegfilgrastim (NEULASTA ONPRO KIT) injection 6 mg, 6 mg, Subcutaneous, Once, 2 of 2 cycles Administration: 6 mg (11/05/2018), 6 mg (11/29/2018) CARBOplatin (PARAPLATIN) 540 mg in sodium chloride 0.9 % 250 mL chemo infusion, 540 mg (100 % of original dose 540 mg), Intravenous,  Once, 4 of 6 cycles Dose modification:   (original dose 540 mg, Cycle 1) Administration: 540 mg (11/05/2018), 540 mg (11/29/2018), 540 mg (12/20/2018), 540 mg (01/14/2019) PACLitaxel (TAXOL) 348 mg in sodium chloride 0.9 % 500 mL chemo infusion (> 66m/m2), 175 mg/m2 = 348 mg, Intravenous,  Once, 4 of 6 cycles Dose modification: 150 mg/m2 (original dose 175 mg/m2, Cycle 4, Reason: Other (see comments), Comment: neuropathy) Administration: 348 mg (11/05/2018), 348 mg (11/29/2018), 348 mg (12/20/2018), 300 mg (01/14/2019)  for chemotherapy treatment.      Subjective Data:  ECOG: 2 - Symptomatic, <50% confined to bed   Subjective:     Molly JHAVERIis a 68y.o. female who presents for evaluation of fatigue, weakness and generalized malaise. Symptoms began several weeks ago and are intermittent.  The patient feels the fatigue began with: Chemotherapy. Symptoms of her fatigue have been change in appetite, general malaise and Weakness. Patient describes the following psychological symptoms: none. Patient denies constipation, fever and Abdominal pain, chest pain, shortness of breath or diarrhea.. Symptoms have gradually improved. Symptom severity: moderate. [facility] for this problem: [facility] for similar complaints requiring IV fluids and electrolyte replacement.  The following portions of the patient's history were reviewed and updated as appropriate: allergies, current medications, past family history,  past medical history, past social history, past surgical history and problem list.  Review of Systems Review of Systems  Constitutional: Positive for malaise/fatigue and weight loss. Negative for chills and fever.  HENT: Negative for congestion, ear pain and tinnitus.   Eyes: Negative.  Negative for blurred vision and double vision.  Respiratory: Negative.  Negative for cough, sputum production and shortness of breath.   Cardiovascular: Negative.  Negative for chest pain, palpitations and leg swelling.  Gastrointestinal: Positive for nausea. Negative for abdominal pain, constipation, diarrhea and vomiting.  Genitourinary: Negative for dysuria, frequency and urgency.  Musculoskeletal: Negative for back pain and falls.  Skin: Negative.  Negative for rash.  Neurological: Positive for dizziness and weakness. Negative for headaches.  Endo/Heme/Allergies: Negative.  Does not bruise/bleed easily.  Psychiatric/Behavioral: Negative.  Negative for depression. The patient is not nervous/anxious and does not have insomnia.       Objective:   Physical Exam Vitals signs reviewed.  Constitutional:      General: She is awake.     Appearance: Normal appearance. She is obese. She is ill-appearing.  HENT:     Head: Normocephalic and atraumatic.  Eyes:     Pupils: Pupils are equal, round, and reactive to light.  Neck:     Musculoskeletal: Normal range of motion.  Cardiovascular:     Rate and Rhythm: Normal rate and regular rhythm.     Heart sounds: Normal heart sounds. No murmur.  Pulmonary:     Effort: Pulmonary effort is normal.     Breath sounds: Decreased breath sounds present. No wheezing.  Abdominal:     General: Bowel sounds are normal. There is no distension.     Palpations: Abdomen is soft.     Tenderness: There is no abdominal tenderness.  Musculoskeletal: Normal range of motion.  Skin:    General: Skin is warm and dry.     Coloration: Skin is pale.     Findings: No rash.    Neurological:     Mental Status: She is alert and oriented to person, place, and time.  Psychiatric:        Judgment: Judgment normal.     Assessment:    Fatigue and weakness likely due to malnutrition, change in appetite, declining performance status d/t chemotherapy and/or progressing malignancy.   Plan:    Endometrial adenocarcinoma: s/p TLH-BSO with lymph node biopsies by Dr. Fransisca Connors.  Pathology revealed endometrioid adenocarcinoma.  Case discussed at multidisciplinary case conference where 3 cycles of adjuvant carbo/Taxol followed by radiation followed by 3 additional cycles of carbotaxol are recommended.  Still plans to radiate pelvic and periaortic lymph nodes with boost to vaginal cuff.  Initiated chemotherapy on 11/05/2018 and completed 3 cycles on 12/20/2018.  Plan is for 3 additional cycles of carbotaxol prior to initiating radiation per most recent recommendation by gyn-onc.  Scheduled to return to clinic on 02/04/2019 for assessment prior to cycle 5 carbo/Taxol.   Generalized weakness/malaise: Multifactorial.  Given labs look pretty good today will r/o  influenza and strep throat.  Complains of intermittent sore throat.  She denies fevers.  If negative, likely related to malnutrition, dehydration and chemotherapy.  Electrolyte abnormalities: Magnesium 1.6. Denies diarrhea.  Continuing to take magnesium supplementation without problem.   Cytopenias: d/t chemotherapy.  Stable.  Nausea: Taking antiemetics as  prescribed.  We will give Decadron and Zofran IV in clinic today.  Tolerating p.o.'s.   Plan: Stat labs. (Mag 1.6, WBC 3.9, Hg 10.6) Stat influenza swab.(Negative) Stat strep swab. (Negative)  In clinic administration: 1 L NaCl. 10 mg Decadron 8 mg Zofran  New or changing prescriptions: Samples given of Ensure clears, coupons and ensure hydration packets.  Disposition: RTC as planned on Friday, 02/04/2019 for assessment by Dr. Janese Banks  prior to cycle 5 carbo/Taxol.   Patient may need scheduled fluids in between treatments given poor toleration of cycle 4.   Greater than 50% was spent in counseling and coordination of care with this patient including but not limited to discussion of the relevant topics above (See A&P) including, but not limited to diagnosis and management of acute and chronic medical conditions.   [name], NP 02/02/2019 3:19 PM

## 2019-02-02 NOTE — Patient Instructions (Addendum)
It was great to see you today I am sorry that you are feeling so weak and fatigued.  Today we gave you 1.5 L of fluid, 10 mg Decadron (steroid) and 8 mg Zofran for nausea.  Your labs looked pretty good today.  Given your normal labs and your worsening weakness and fatigue we checked you for the flu and strep.  If those are negative this is likely just due to your chemotherapy.  As I mentioned before, it is not uncommon for patients to require additional IV fluids in between treatments.  These treatments are hard.  We will schedule you for IV fluids on Monday after your next treatment to make sure you are doing well.  I have also given you some electrolyte replacements that are recommended by our dietitian here at the cancer center.  They have all the electrolytes that are needed to help keep you hydrated.  Try and drink 2 of these daily mixed with 16 ounces of water.  You also can try some of the Ensure clears and or regular ensures for continued hydration and to prevent malnutrition.  Please let me know if you have any other questions and/or symptoms worsen or fail to improve.  Molly Casa, NP 02/02/2019 12:34 PM   Labs today:  Results for Molly [name], Molly [name] (MRN [id number]) as of 02/02/2019 12:51  Ref. Range 02/02/2019 10:00  COMPREHENSIVE METABOLIC PANEL Unknown Rpt (A)  Sodium Latest Ref Range: 135 - 145 mmol/L 139  Potassium Latest Ref Range: 3.5 - 5.1 mmol/L 3.5  Chloride Latest Ref Range: 98 - 111 mmol/L 104  CO2 Latest Ref Range: 22 - 32 mmol/L 31  Glucose Latest Ref Range: 70 - 99 mg/dL 99  BUN Latest Ref Range: 8 - 23 mg/dL 11  Creatinine Latest Ref Range: 0.44 - 1.00 mg/dL 0.62  Calcium Latest Ref Range: 8.9 - 10.3 mg/dL 8.9  Anion gap Latest Ref Range: 5 - 15  4 (L)  Magnesium Latest Ref Range: 1.7 - 2.4 mg/dL 1.6 (L)  Alkaline Phosphatase Latest Ref Range: 38 - 126 U/L 78  Albumin Latest Ref Range: 3.5 - 5.0 g/dL 3.8  AST Latest Ref Range: 15 - 41 U/L 19  ALT Latest Ref  Range: 0 - 44 U/L 16  Total Protein Latest Ref Range: 6.5 - 8.1 g/dL 6.8  Total Bilirubin Latest Ref Range: 0.3 - 1.2 mg/dL 0.4  GFR, Est Non African American Latest Ref Range: >60 mL/min >60  GFR, Est African American Latest Ref Range: >60 mL/min >60  WBC Latest Ref Range: 4.0 - 10.5 K/uL 3.9 (L)  RBC Latest Ref Range: 3.87 - 5.11 MIL/uL 3.49 (L)  Hemoglobin Latest Ref Range: 12.0 - 15.0 g/dL 10.6 (L)  HCT Latest Ref Range: 36.0 - 46.0 % 32.3 (L)  MCV Latest Ref Range: 80.0 - 100.0 fL 92.6  MCH Latest Ref Range: 26.0 - 34.0 pg 30.4  MCHC Latest Ref Range: 30.0 - 36.0 g/dL 32.8  RDW Latest Ref Range: 11.5 - 15.5 % 15.2  Platelets Latest Ref Range: 150 - 400 K/uL 200  nRBC Latest Ref Range: 0.0 - 0.2 % 0.0  Neutrophils Latest Units: % 53  Lymphocytes Latest Units: % 35  Monocytes Relative Latest Units: % 10  Eosinophil Latest Units: % 1  Basophil Latest Units: % 0  Immature Granulocytes Latest Units: % 1  NEUT# Latest Ref Range: 1.7 - 7.7 K/uL 2.1  Lymphocyte # Latest Ref Range: 0.7 - 4.0 K/uL 1.4  Monocyte # Latest  Ref Range: 0.1 - 1.0 K/uL 0.4  Eosinophils Absolute Latest Ref Range: 0.0 - 0.5 K/uL 0.0  Basophils Absolute Latest Ref Range: 0.0 - 0.1 K/uL 0.0  Abs Immature Granulocytes Latest Ref Range: 0.00 - 0.07 K/uL 0.02     Dehydration, Adult  Dehydration is when there is not enough fluid or water in your body. This happens when you lose more fluids than you take in. Dehydration can range from mild to very bad. It should be treated right away to keep it from getting very bad. Symptoms of mild dehydration may include:  Thirst.  Dry lips.  Slightly dry mouth.  Dry, warm skin.  Dizziness. Symptoms of moderate dehydration may include:  Very dry mouth.  Muscle cramps.  Dark pee (urine). Pee may be the color of tea.  Your body making less pee.  Your eyes making fewer tears.  Heartbeat that is uneven or faster than normal  (palpitations).  Headache.  Light-headedness, especially when you stand up from sitting.  Fainting (syncope). Symptoms of very bad dehydration may include:  Changes in skin, such as: ? Cold and clammy skin. ? Blotchy (mottled) or pale skin. ? Skin that does not quickly return to normal after being lightly pinched and let go (poor skin turgor).  Changes in body fluids, such as: ? Feeling very thirsty. ? Your eyes making fewer tears. ? Not sweating when body temperature is high, such as in hot weather. ? Your body making very little pee.  Changes in vital signs, such as: ? Weak pulse. ? Pulse that is more than 100 beats a minute when you are sitting still. ? Fast breathing. ? Low blood pressure.  Other changes, such as: ? Sunken eyes. ? Cold hands and feet. ? Confusion. ? Lack of energy (lethargy). ? Trouble waking up from sleep. ? Short-term weight loss. ? Unconsciousness. Follow these instructions at home:   If told by your doctor, drink an ORS: ? Make an ORS by using instructions on the package. ? Start by drinking small amounts, about  cup (120 mL) every 5-10 minutes. ? Slowly drink more until you have had the amount that your doctor said to have.  Drink enough clear fluid to keep your pee clear or pale yellow. If you were told to drink an ORS, finish the ORS first, then start slowly drinking clear fluids. Drink fluids such as: ? Water. Do not drink only water by itself. Doing that can make the salt (sodium) level in your body get too low (hyponatremia). ? Ice chips. ? Fruit juice that you have added water to (diluted). ? Low-calorie sports drinks.  Avoid: ? Alcohol. ? Drinks that have a lot of sugar. These include high-calorie sports drinks, fruit juice that does not have water added, and soda. ? Caffeine. ? Foods that are greasy or have a lot of fat or sugar.  Take over-the-counter and prescription medicines only as told by your doctor.  Do not take salt  tablets. Doing that can make the salt level in your body get too high (hypernatremia).  Eat foods that have minerals (electrolytes). Examples include bananas, oranges, potatoes, tomatoes, and spinach.  Keep all follow-up visits as told by your doctor. This is important. Contact a doctor if:  You have belly (abdominal) pain that: ? Gets worse. ? Stays in one area (localizes).  You have a rash.  You have a stiff neck.  You get angry or annoyed more easily than normal (irritability).  You are more sleepy  than normal.  You have a harder time waking up than normal.  You feel: ? Weak. ? Dizzy. ? Very thirsty.  You have peed (urinated) only a small amount of very dark pee during 6-8 hours. Get help right away if:  You have symptoms of very bad dehydration.  You cannot drink fluids without throwing up (vomiting).  Your symptoms get worse with treatment.  You have a fever.  You have a very bad headache.  You are throwing up or having watery poop (diarrhea) and it: ? Gets worse. ? Does not go away.  You have blood or something green (bile) in your throw-up.  You have blood in your poop (stool). This may cause poop to look black and tarry.  You have not peed in 6-8 hours.  You pass out (faint).  Your heart rate when you are sitting still is more than 100 beats a minute.  You have trouble breathing. This information is not intended to replace advice given to you by your health care provider. Make sure you discuss any questions you have with your health care provider. Document Released: 10/11/2009 Document Revised: 07/04/2016 Document Reviewed: 02/08/2016 Elsevier Interactive Patient Education  2019 [employer].

## 2019-02-02 NOTE — Telephone Encounter (Signed)
This was sent to PCP for approval on yesterday.

## 2019-02-02 NOTE — Telephone Encounter (Signed)
Copied from [place] [id number]. Topic: Quick Communication - Rx Refill/Question >> Feb 02, 2019  9:06 AM Antonieta Iba C wrote: Medication: pregabalin (LYRICA) 50 MG capsule - 90 day supply.  Has the patient contacted their pharmacy? Yes   (Agent: If no, request that the patient contact the pharmacy for the refill.) (Agent: If yes, when and what did the pharmacy advise?)  Preferred Pharmacy (with phone number or street name): [place], [place] - [address] [phone] (Phone) [phone] (Fax)    Agent: Please be advised that RX refills may take up to 3 business days. We ask that you follow-up with your pharmacy.

## 2019-02-03 ENCOUNTER — Ambulatory Visit: Payer: Medicare Other

## 2019-02-04 ENCOUNTER — Other Ambulatory Visit: Payer: Self-pay

## 2019-02-04 ENCOUNTER — Inpatient Hospital Stay (HOSPITAL_BASED_OUTPATIENT_CLINIC_OR_DEPARTMENT_OTHER): Payer: Medicare Other | Admitting: Oncology

## 2019-02-04 ENCOUNTER — Encounter: Payer: Self-pay | Admitting: Oncology

## 2019-02-04 ENCOUNTER — Inpatient Hospital Stay: Payer: Medicare Other

## 2019-02-04 ENCOUNTER — Ambulatory Visit: Payer: Medicare Other

## 2019-02-04 VITALS — BP 144/88 | HR 54

## 2019-02-04 VITALS — BP 148/85 | HR 62 | Temp 96.3°F | Resp 18 | Wt 220.1 lb

## 2019-02-04 DIAGNOSIS — M898X9 Other specified disorders of bone, unspecified site: Secondary | ICD-10-CM | POA: Diagnosis not present

## 2019-02-04 DIAGNOSIS — C541 Malignant neoplasm of endometrium: Secondary | ICD-10-CM | POA: Diagnosis not present

## 2019-02-04 DIAGNOSIS — R531 Weakness: Secondary | ICD-10-CM

## 2019-02-04 DIAGNOSIS — Z5111 Encounter for antineoplastic chemotherapy: Secondary | ICD-10-CM

## 2019-02-04 DIAGNOSIS — E876 Hypokalemia: Secondary | ICD-10-CM

## 2019-02-04 LAB — CBC WITH DIFFERENTIAL/PLATELET
Abs Immature Granulocytes: 0.04 10*3/uL (ref 0.00–0.07)
BASOS ABS: 0 10*3/uL (ref 0.0–0.1)
BASOS PCT: 0 %
Eosinophils Absolute: 0 10*3/uL (ref 0.0–0.5)
Eosinophils Relative: 0 %
HCT: 31.2 % — ABNORMAL LOW (ref 36.0–46.0)
Hemoglobin: 10.2 g/dL — ABNORMAL LOW (ref 12.0–15.0)
Immature Granulocytes: 1 %
Lymphocytes Relative: 29 %
Lymphs Abs: 2.3 10*3/uL (ref 0.7–4.0)
MCH: 30.5 pg (ref 26.0–34.0)
MCHC: 32.7 g/dL (ref 30.0–36.0)
MCV: 93.4 fL (ref 80.0–100.0)
Monocytes Absolute: 0.6 10*3/uL (ref 0.1–1.0)
Monocytes Relative: 8 %
Neutro Abs: 5 10*3/uL (ref 1.7–7.7)
Neutrophils Relative %: 62 %
Platelets: 201 10*3/uL (ref 150–400)
RBC: 3.34 MIL/uL — ABNORMAL LOW (ref 3.87–5.11)
RDW: 15.8 % — ABNORMAL HIGH (ref 11.5–15.5)
WBC: 7.9 10*3/uL (ref 4.0–10.5)
nRBC: 0 % (ref 0.0–0.2)

## 2019-02-04 LAB — COMPREHENSIVE METABOLIC PANEL
ALT: 15 U/L (ref 0–44)
AST: 16 U/L (ref 15–41)
Albumin: 3.6 g/dL (ref 3.5–5.0)
Alkaline Phosphatase: 68 U/L (ref 38–126)
Anion gap: 5 (ref 5–15)
BUN: 22 mg/dL (ref 8–23)
CO2: 30 mmol/L (ref 22–32)
Calcium: 8.9 mg/dL (ref 8.9–10.3)
Chloride: 106 mmol/L (ref 98–111)
Creatinine, Ser: 0.82 mg/dL (ref 0.44–1.00)
GFR calc Af Amer: >60 mL/min (ref >60)
GFR calc non Af Amer: >60 mL/min (ref >60)
Glucose, Bld: 105 mg/dL — ABNORMAL HIGH (ref 70–99)
POTASSIUM: 3.2 mmol/L — AB (ref 3.5–5.1)
Sodium: 141 mmol/L (ref 135–145)
Total Bilirubin: 0.3 mg/dL (ref 0.3–1.2)
Total Protein: 6.6 g/dL (ref 6.5–8.1)

## 2019-02-04 MED ORDER — SODIUM CHLORIDE 0.9 % IV SOLN
540.0000 mg | Freq: Once | INTRAVENOUS | Status: AC
  Administered 2019-02-04: 540 mg via INTRAVENOUS
  Filled 2019-02-04: qty 54

## 2019-02-04 MED ORDER — PALONOSETRON HCL INJECTION 0.25 MG/5ML
0.2500 mg | Freq: Once | INTRAVENOUS | Status: AC
  Administered 2019-02-04: 0.25 mg via INTRAVENOUS
  Filled 2019-02-04: qty 5

## 2019-02-04 MED ORDER — DIPHENHYDRAMINE HCL 50 MG/ML IJ SOLN
50.0000 mg | Freq: Once | INTRAMUSCULAR | Status: AC
  Administered 2019-02-04: 50 mg via INTRAVENOUS
  Filled 2019-02-04: qty 1

## 2019-02-04 MED ORDER — SODIUM CHLORIDE 0.9 % IV SOLN
Freq: Once | INTRAVENOUS | Status: AC
  Administered 2019-02-04: 11:00:00 via INTRAVENOUS
  Filled 2019-02-04: qty 500

## 2019-02-04 MED ORDER — SODIUM CHLORIDE 0.9% FLUSH
10.0000 mL | Freq: Once | INTRAVENOUS | Status: AC
  Administered 2019-02-04: 10 mL via INTRAVENOUS
  Filled 2019-02-04: qty 10

## 2019-02-04 MED ORDER — SODIUM CHLORIDE 0.9 % IV SOLN
150.0000 mg/m2 | Freq: Once | INTRAVENOUS | Status: AC
  Administered 2019-02-04: 300 mg via INTRAVENOUS
  Filled 2019-02-04: qty 50

## 2019-02-04 MED ORDER — FAMOTIDINE IN NACL 20-0.9 MG/50ML-% IV SOLN
20.0000 mg | Freq: Once | INTRAVENOUS | Status: AC
  Administered 2019-02-04: 20 mg via INTRAVENOUS
  Filled 2019-02-04: qty 50

## 2019-02-04 MED ORDER — SODIUM CHLORIDE 0.9 % IV SOLN
Freq: Once | INTRAVENOUS | Status: AC
  Administered 2019-02-04: 09:00:00 via INTRAVENOUS
  Filled 2019-02-04: qty 250

## 2019-02-04 MED ORDER — SODIUM CHLORIDE 0.9 % IV SOLN
20.0000 mg | Freq: Once | INTRAVENOUS | Status: AC
  Administered 2019-02-04: 20 mg via INTRAVENOUS
  Filled 2019-02-04: qty 2

## 2019-02-04 MED ORDER — HEPARIN SOD (PORK) LOCK FLUSH 100 UNIT/ML IV SOLN
500.0000 [IU] | Freq: Once | INTRAVENOUS | Status: AC
  Administered 2019-02-04: 500 [IU] via INTRAVENOUS
  Filled 2019-02-04: qty 5

## 2019-02-04 MED ORDER — LIDOCAINE-PRILOCAINE 2.5-2.5 % EX CREA: TOPICAL_CREAM | CUTANEOUS | 3 refills | Status: DC

## 2019-02-04 NOTE — Progress Notes (Signed)
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 Marcelline Mates for cystocele and rectocele. She was noted to have postmenopausal bleeding and cramping recently. Ultrasound revealed heterogeneous appearance of uterus with fibroids invading the endometrium. She underwent an endometrial biopsy when she had a second bout of postmenopausal bleeding. Biopsy showed endometrioid adenocarcinoma FIGO grade 1. She was then seen by Dr. Fransisca Connors and plan was to proceed with laparoscopic hysterectomy and bilateral salpingo-oophorectomy along with pelvic lymph node sampling  Final pathology showed: Endometrioid carcinoma FIGO grade 2 with 33% myometrial invasion. Lymphovascular invasion present. Peritoneal/ascites fluid atypical. 2 out of 3 sentinel lymph nodes were positive for macro metastases. pT1a pN1a. FIGO IIIC1  Patient's case was discussed at tumor board and consensus was to proceed with 3 cycles of adjuvant carbotaxol followed by radiation followed by 3 more cycles of chemotherapy. Patient is already met with Dr. Donella Stade who will be giving radiation therapy to pelvic and periaortic lymph nodes and boost to her vaginal cuff. PET CT scan showed metastatic adenopathy and retroperitoneal and external iliac group of lymph nodes in the right side. No evidence of metastatic  disease elsewhere.  First cycle of chemotherapy was given on 11/05/2018   Interval history-she reports feeling better today after she received 2 days of IV fluids.  She still feels weak and has occasional aches and pains in her lower extremities.  Denies any nausea or vomiting.  Appetite is fair  ECOG PS- 1 Pain scale- 3 Opioid associated constipation- no  Review of systems- Review of Systems  Constitutional: Positive for malaise/fatigue. Negative for chills, fever and weight loss.  HENT: Negative for congestion, ear discharge and nosebleeds.   Eyes: Negative for blurred vision.  Respiratory: Negative for cough, hemoptysis, sputum production, shortness of breath and wheezing.   Cardiovascular: Negative for chest pain, palpitations, orthopnea and claudication.  Gastrointestinal: Negative for abdominal pain, blood in stool, constipation, diarrhea, heartburn, melena, nausea and vomiting.  Genitourinary: Negative for dysuria, flank pain, frequency, hematuria and urgency.  Musculoskeletal: Positive for joint pain and myalgias. Negative for back pain.  Skin: Negative for rash.  Neurological: Negative for dizziness, tingling, focal weakness, seizures, weakness and headaches.  Endo/Heme/Allergies: Does not bruise/bleed easily.  Psychiatric/Behavioral: Negative for depression and suicidal ideas. The patient does not have insomnia.        Allergies  Allergen Reactions  . Ferumoxytol Anaphylaxis  . Nsaids Hives and Rash  . Statins Other (See Comments)    Joint pains  . Victoza [Liraglutide] Other (See Comments)    pancreatitis  . Oxycodone Nausea And Vomiting  . Crestor [Rosuvastatin Calcium] Rash     Past Medical History:  Diagnosis Date  . Anxiety   . Asthma    history of asthma  . Cardiomyopathy ([place])   . Complication of anesthesia    tore hair out and made teeth rough  . Depression   . Endometrial cancer ([place]) 08/2018  . GERD (gastroesophageal reflux disease)  takes  prilosec prn  . History of CVA (cerebrovascular accident) 12/10/2015  . Hyperlipidemia LDL goal <70 12/10/2015  . Hypertension   . Prediabetes 10/02/2017   A1c 6 in January 2018  . Stroke [facility]) 1990    no residual effects     Past Surgical History:  Procedure Laterality Date  . ABDOMINAL HYSTERECTOMY    . CHOLECYSTECTOMY    . COLONOSCOPY WITH PROPOFOL N/A 01/08/2017   Procedure: COLONOSCOPY WITH PROPOFOL;  Surgeon: [name], MD;  Location: [facility] ENDOSCOPY;  Service: Endoscopy;  Laterality: N/A;  . COLONOSCOPY WITH PROPOFOL N/A 05/05/2018   Procedure: COLONOSCOPY WITH PROPOFOL;  Surgeon: [name], MD;  Location: [facility] ENDOSCOPY;  Service: Gastroenterology;  Laterality: N/A;  . HERNIA REPAIR  [id number]/[id number]   umbilical  . PORTACATH PLACEMENT N/A 11/04/2018   Procedure: INSERTION PORT-A-CATH;  Surgeon: Jules Husbands, MD;  Location: [facility] ORS;  Service: General;  Laterality: N/A;  . SENTINEL NODE BIOPSY N/A 10/06/2018   Procedure: SENTINEL NODE BIOPSY;  Surgeon: Mellody Drown, MD;  Location: [facility] ORS;  Service: Gynecology;  Laterality: N/A;  . UMBILICAL HERNIA REPAIR N/A 11/17/2017   Procedure: HERNIA REPAIR UMBILICAL ADULT;  Surgeon: Jules Husbands, MD;  Location: [facility] ORS;  Service: General;  Laterality: N/A;    Social History   Socioeconomic History  . Marital status: Divorced    Spouse name: Not on file  . Number of children: 2  . Years of education: some college  . Highest education level: 12th grade  Occupational History  . 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 Occupation: currently a Careers adviser  . Financial resource strain: Not hard at all  . Food insecurity:    Worry: Never true    Inability: Never true  . Transportation needs:    Medical: No    Non-medical: No  Tobacco Use  . Smoking status: Never Smoker  . Smokeless tobacco: Never Used  . Tobacco comment: smoking cessation materials not required  Substance and Sexual Activity  .  Alcohol use: No    Alcohol/week: 0.0 standard drinks  . Drug use: No  . Sexual activity: Not Currently  Lifestyle  . Physical activity:    Days per week: 0 days    Minutes per session: 0 min  . Stress: Not at all  Relationships  . Social connections:    Talks on phone: More than three times a week    Gets together: Three times a week    Attends religious service: More than 4 times per year    Active member of club or organization: Yes    Attends meetings of clubs or organizations: More than 4 times per year    Relationship status: Divorced  . Intimate partner violence:    Fear of current or ex partner: No    Emotionally abused: No    Physically abused: No    Forced sexual activity: No  Other Topics Concern  . Not on file  Social History Narrative  . Not on file    Family History  Problem Relation Age of Onset  . Stroke Mother   . Hypertension Mother   . Dementia Mother   . Alzheimer's disease Mother   . Gout Father   . Asthma Father   . Hypertension Father   . Dementia Father   . Healthy Sister   . Stroke Brother   . Alzheimer's disease Brother   . Healthy Daughter   .  Hypertension Brother   . Healthy Brother   . Cancer Paternal Grandmother   . Healthy Sister   . Healthy Sister   . Hypertension Sister   . Hypertension Sister   . Stroke Brother   . Alzheimer's disease Brother   . Stroke Brother   . Hypertension Brother   . Stroke Brother   . Hypertension Brother   . Healthy Brother   . Healthy Brother   . Hypertension Daughter   . Breast cancer Neg Hx      Current Outpatient Medications:  .  dexamethasone (DECADRON) 4 MG tablet, Take 2 tablets (8 mg total) by mouth daily. Start the day after chemotherapy for 2 days., Disp: 30 tablet, Rfl: 1 .  diclofenac sodium (VOLTAREN) 1 % GEL, Apply 2 g topically 3 (three) times daily as needed (pain)., Disp: 100 g, Rfl: 1 .  escitalopram (LEXAPRO) 10 MG tablet, Take 1 tablet (10 mg total) by mouth daily., Disp: 90  tablet, Rfl: 0 .  ezetimibe (ZETIA) 10 MG tablet, Take 1 tablet (10 mg total) by mouth daily., Disp: 90 tablet, Rfl: 0 .  fluticasone furoate-vilanterol (BREO ELLIPTA) 100-25 MCG/INH AEPB, Inhale 1 puff into the lungs daily., Disp: 60 each, Rfl: 0 .  lidocaine-prilocaine (EMLA) cream, Apply to affected area once, Disp: 30 g, Rfl: 3 .  loratadine (CLARITIN) 10 MG tablet, Take 1 tablet by mouth daily., Disp: 30 tablet, Rfl: 5 .  magnesium oxide (MAG-OX) 400 (241.3 Mg) MG tablet, Take 1 tablet (400 mg total) by mouth daily. For hypomagnesemia, Disp: 30 tablet, Rfl: 0 .  olmesartan-hydrochlorothiazide (BENICAR HCT) 20-12.5 MG tablet, Take 1 tablet by mouth daily., Disp: 90 tablet, Rfl: 0 .  omeprazole (PRILOSEC OTC) 20 MG tablet, Take 20 mg by mouth daily as needed., Disp: , Rfl:  .  pregabalin (LYRICA) 50 MG capsule, Take 1 capsule (50 mg total) by mouth 2 (two) times daily., Disp: 60 capsule, Rfl: 1 .  prochlorperazine (COMPAZINE) 10 MG tablet, Take 10 mg by mouth every 6 (six) hours as needed for nausea or vomiting., Disp: , Rfl:  .  acetaminophen (TYLENOL) 500 MG tablet, Take 2 tablets (1,000 mg total) by mouth every 6 (six) hours as needed for mild pain or moderate pain. (Patient not taking: Reported on 02/04/2019), Disp: 30 tablet, Rfl: 0 .  albuterol (PROVENTIL HFA;VENTOLIN HFA) 108 (90 Base) MCG/ACT inhaler, Inhale 2 puffs into the lungs every 4 (four) hours as needed for wheezing or shortness of breath. (Patient not taking: Reported on 02/04/2019), Disp: 1 Inhaler, Rfl: 0 .  furosemide (LASIX) 40 MG tablet, Take 1 tablet (40 mg total) by mouth daily as needed for fluid. (Patient not taking: Reported on 02/04/2019), Disp: 90 tablet, Rfl: 0 .  ketoconazole (NIZORAL) 2 % cream, Apply 1 application topically daily as needed for irritation. On abdominal fold prn (Patient not taking: Reported on 02/04/2019), Disp: 60 g, Rfl: 0 .  LORazepam (ATIVAN) 0.5 MG tablet, Take 1 tablet (0.5 mg total) by mouth every 6  (six) hours as needed (Nausea or vomiting). (Patient not taking: Reported on 02/04/2019), Disp: 30 tablet, Rfl: 0 .  morphine (MSIR) 15 MG tablet, Take 1 tablet (15 mg total) by mouth every 6 (six) hours as needed for severe pain. (Patient not taking: Reported on 02/04/2019), Disp: 20 tablet, Rfl: 0 .  ondansetron (ZOFRAN) 8 MG tablet, Take 1 tablet (8 mg total) by mouth 2 (two) times daily as needed for refractory nausea / vomiting. Start on day  3 after chemo. (Patient not taking: Reported on 02/04/2019), Disp: 30 tablet, Rfl: 1 .  traMADol (ULTRAM) 50 MG tablet, Take 1 tablet (50 mg total) by mouth every 6 (six) hours as needed. (Patient not taking: Reported on 02/04/2019), Disp: 15 tablet, Rfl: 0 .  triamcinolone ointment (KENALOG) 0.5 %, Apply 1 application topically 2 (two) times daily. (Patient not taking: Reported on 02/04/2019), Disp: 30 g, Rfl: 0 No current facility-administered medications for this visit.   Facility-Administered Medications Ordered in Other Visits:  .  sodium chloride flush (NS) 0.9 % injection 10 mL, 10 mL, Intravenous, PRN, Sindy Guadeloupe, MD, 10 mL at 11/15/18 0915  Physical exam:  Vitals:   02/04/19 0848  BP: (!) 148/85  Pulse: 62  Resp: 18  Temp: (!) 96.3 F (35.7 C)  TempSrc: Tympanic  Weight: 220 lb 1.6 oz (99.8 kg)   Physical Exam HENT:     Head: Normocephalic and atraumatic.  Eyes:     Pupils: Pupils are equal, round, and reactive to light.  Neck:     Musculoskeletal: Normal range of motion.  Cardiovascular:     Rate and Rhythm: Normal rate and regular rhythm.     Heart sounds: Normal heart sounds.  Pulmonary:     Effort: Pulmonary effort is normal.     Breath sounds: Normal breath sounds.  Abdominal:     General: Bowel sounds are normal.     Palpations: Abdomen is soft.  Skin:    General: Skin is warm and dry.  Neurological:     Mental Status: She is alert and oriented to person, place, and time.      CMP Latest Ref Rng & Units 02/04/2019  Glucose  70 - 99 mg/dL 105(H)  BUN 8 - 23 mg/dL 22  Creatinine 0.44 - 1.00 mg/dL 0.82  Sodium 135 - 145 mmol/L 141  Potassium 3.5 - 5.1 mmol/L 3.2(L)  Chloride 98 - 111 mmol/L 106  CO2 22 - 32 mmol/L 30  Calcium 8.9 - 10.3 mg/dL 8.9  Total Protein 6.5 - 8.1 g/dL 6.6  Total Bilirubin 0.3 - 1.2 mg/dL 0.3  Alkaline Phos 38 - 126 U/L 68  AST 15 - 41 U/L 16  ALT 0 - 44 U/L 15   CBC Latest Ref Rng & Units 02/04/2019  WBC 4.0 - 10.5 K/uL 7.9  Hemoglobin 12.0 - 15.0 g/dL 10.2(L)  Hematocrit 36.0 - 46.0 % 31.2(L)  Platelets 150 - 400 K/uL 201     Assessment and plan- Patient is a 68 y.o. female invasive adenocarcinoma of the endometrium endometrioid subtypeFIGO Stage IIIC2 pT1a N2 M0. She is here for on treatment assessment prior to cycle 5 of carbotaxol chemotherapy  Counts okay to proceed with cycle 5 of carbotaxol chemotherapy today.  She has not received Neulasta with cycle 3 and cycle 4 and has done well so far.  She does get significant bone pains with Neulasta.  I will therefore hold off on it this time as well.  Her hemoglobin is stable around 10 white count and platelet counts are normal.  We will schedule her for IV fluids twice a week starting next week.  I will give her 10 mg of IV Decadron along with fluids once a week and check BMP weekly as well.  Hypokalemia: We will give her 20 mEq of IV potassium today and send her home with 20 mEq of oral potassium for 1 week  I will see her back in 3 weeks time for cycle  6 of carbotaxol chemotherapy which will be her last cycle and I will plan to get repeat CT chest abdomen pelvis 4 weeks from now.  Patient will call us sooner if she has any questions or concerns   Visit Diagnosis 1. Encounter for antineoplastic chemotherapy   2. Endometrial adenocarcinoma ([place])   3. Hypokalemia      Dr. Randa Evens, MD, MPH [facility] [phone] 02/04/2019 3:23 PM

## 2019-02-04 NOTE — Progress Notes (Signed)
Pt here for follow up. " I feel better today than I have in a month"

## 2019-02-05 LAB — CA 125: Cancer Antigen (CA) 125: 11.4 U/mL (ref 0.0–38.1)

## 2019-02-07 ENCOUNTER — Inpatient Hospital Stay: Payer: Medicare Other

## 2019-02-07 ENCOUNTER — Ambulatory Visit: Payer: Medicare Other

## 2019-02-07 DIAGNOSIS — E876 Hypokalemia: Secondary | ICD-10-CM | POA: Diagnosis not present

## 2019-02-07 DIAGNOSIS — M898X9 Other specified disorders of bone, unspecified site: Secondary | ICD-10-CM | POA: Diagnosis not present

## 2019-02-07 DIAGNOSIS — R531 Weakness: Secondary | ICD-10-CM | POA: Diagnosis not present

## 2019-02-07 DIAGNOSIS — C541 Malignant neoplasm of endometrium: Secondary | ICD-10-CM

## 2019-02-07 DIAGNOSIS — Z5111 Encounter for antineoplastic chemotherapy: Secondary | ICD-10-CM | POA: Diagnosis not present

## 2019-02-07 MED ORDER — SODIUM CHLORIDE 0.9 % IV SOLN
Freq: Once | INTRAVENOUS | Status: AC
  Administered 2019-02-07: 14:00:00 via INTRAVENOUS
  Filled 2019-02-07: qty 250

## 2019-02-07 MED ORDER — HEPARIN SOD (PORK) LOCK FLUSH 100 UNIT/ML IV SOLN
500.0000 [IU] | Freq: Once | INTRAVENOUS | Status: AC
  Administered 2019-02-07: 500 [IU] via INTRAVENOUS

## 2019-02-08 ENCOUNTER — Ambulatory Visit: Payer: Medicare Other

## 2019-02-09 ENCOUNTER — Ambulatory Visit: Payer: Medicare Other

## 2019-02-10 ENCOUNTER — Ambulatory Visit: Payer: Medicare Other

## 2019-02-10 ENCOUNTER — Other Ambulatory Visit: Payer: Self-pay | Admitting: *Deleted

## 2019-02-10 ENCOUNTER — Inpatient Hospital Stay: Payer: Medicare Other

## 2019-02-10 DIAGNOSIS — M898X9 Other specified disorders of bone, unspecified site: Secondary | ICD-10-CM | POA: Diagnosis not present

## 2019-02-10 DIAGNOSIS — R531 Weakness: Secondary | ICD-10-CM | POA: Diagnosis not present

## 2019-02-10 DIAGNOSIS — C541 Malignant neoplasm of endometrium: Secondary | ICD-10-CM

## 2019-02-10 DIAGNOSIS — Z5111 Encounter for antineoplastic chemotherapy: Secondary | ICD-10-CM | POA: Diagnosis not present

## 2019-02-10 DIAGNOSIS — E876 Hypokalemia: Secondary | ICD-10-CM

## 2019-02-10 LAB — COMPREHENSIVE METABOLIC PANEL
ALK PHOS: 59 U/L (ref 38–126)
ALT: 14 U/L (ref 0–44)
AST: 20 U/L (ref 15–41)
Albumin: 3.6 g/dL (ref 3.5–5.0)
Anion gap: 9 (ref 5–15)
BILIRUBIN TOTAL: 0.7 mg/dL (ref 0.3–1.2)
BUN: 17 mg/dL (ref 8–23)
CALCIUM: 8.7 mg/dL — AB (ref 8.9–10.3)
CO2: 26 mmol/L (ref 22–32)
Chloride: 103 mmol/L (ref 98–111)
Creatinine, Ser: 0.74 mg/dL (ref 0.44–1.00)
GFR calc Af Amer: >60 mL/min (ref >60)
GFR calc non Af Amer: >60 mL/min (ref >60)
Glucose, Bld: 107 mg/dL — ABNORMAL HIGH (ref 70–99)
Potassium: 3.3 mmol/L — ABNORMAL LOW (ref 3.5–5.1)
Sodium: 138 mmol/L (ref 135–145)
TOTAL PROTEIN: 6.5 g/dL (ref 6.5–8.1)

## 2019-02-10 LAB — CBC WITH DIFFERENTIAL/PLATELET
Abs Immature Granulocytes: 0 10*3/uL (ref 0.00–0.07)
Basophils Absolute: 0 10*3/uL (ref 0.0–0.1)
Basophils Relative: 0 %
EOS ABS: 0.1 10*3/uL (ref 0.0–0.5)
Eosinophils Relative: 2 %
HCT: 33 % — ABNORMAL LOW (ref 36.0–46.0)
Hemoglobin: 11 g/dL — ABNORMAL LOW (ref 12.0–15.0)
Immature Granulocytes: 0 %
Lymphocytes Relative: 48 %
Lymphs Abs: 1.7 10*3/uL (ref 0.7–4.0)
MCH: 31.1 pg (ref 26.0–34.0)
MCHC: 33.3 g/dL (ref 30.0–36.0)
MCV: 93.2 fL (ref 80.0–100.0)
MONO ABS: 0.1 10*3/uL (ref 0.1–1.0)
Monocytes Relative: 2 %
Neutro Abs: 1.6 10*3/uL — ABNORMAL LOW (ref 1.7–7.7)
Neutrophils Relative %: 48 %
Platelets: 204 10*3/uL (ref 150–400)
RBC: 3.54 MIL/uL — ABNORMAL LOW (ref 3.87–5.11)
RDW: 15.1 % (ref 11.5–15.5)
WBC: 3.4 10*3/uL — AB (ref 4.0–10.5)
nRBC: 0 % (ref 0.0–0.2)

## 2019-02-10 MED ORDER — DEXAMETHASONE SODIUM PHOSPHATE 10 MG/ML IJ SOLN
10.0000 mg | Freq: Once | INTRAMUSCULAR | Status: AC
  Administered 2019-02-10: 10 mg via INTRAVENOUS
  Filled 2019-02-10: qty 1

## 2019-02-10 MED ORDER — HEPARIN SOD (PORK) LOCK FLUSH 100 UNIT/ML IV SOLN
500.0000 [IU] | Freq: Once | INTRAVENOUS | Status: AC
  Administered 2019-02-10: 500 [IU] via INTRAVENOUS
  Filled 2019-02-10: qty 5

## 2019-02-10 MED ORDER — SODIUM CHLORIDE 0.9 % IV SOLN
Freq: Once | INTRAVENOUS | Status: AC
  Administered 2019-02-10: 15:00:00 via INTRAVENOUS
  Filled 2019-02-10: qty 1000

## 2019-02-10 MED ORDER — SODIUM CHLORIDE 0.9% FLUSH
10.0000 mL | INTRAVENOUS | Status: DC | PRN
  Administered 2019-02-10: 10 mL via INTRAVENOUS
  Filled 2019-02-10: qty 10

## 2019-02-10 MED ORDER — SODIUM CHLORIDE 0.9 % IV SOLN
INTRAVENOUS | Status: DC
  Administered 2019-02-10: 15:00:00 via INTRAVENOUS
  Filled 2019-02-10: qty 250

## 2019-02-10 MED ORDER — SODIUM CHLORIDE 0.9 % IV SOLN: 10.0000 mg | Freq: Once | INTRAVENOUS | Status: DC

## 2019-02-11 ENCOUNTER — Ambulatory Visit: Payer: Medicare Other | Admitting: Obstetrics and Gynecology

## 2019-02-11 ENCOUNTER — Ambulatory Visit: Payer: Medicare Other

## 2019-02-14 ENCOUNTER — Ambulatory Visit: Payer: Medicare Other

## 2019-02-14 ENCOUNTER — Inpatient Hospital Stay: Payer: Medicare Other | Attending: Oncology

## 2019-02-14 DIAGNOSIS — R531 Weakness: Secondary | ICD-10-CM | POA: Diagnosis not present

## 2019-02-14 DIAGNOSIS — Z5111 Encounter for antineoplastic chemotherapy: Secondary | ICD-10-CM | POA: Diagnosis not present

## 2019-02-14 DIAGNOSIS — M898X9 Other specified disorders of bone, unspecified site: Secondary | ICD-10-CM | POA: Diagnosis not present

## 2019-02-14 DIAGNOSIS — C541 Malignant neoplasm of endometrium: Secondary | ICD-10-CM

## 2019-02-14 DIAGNOSIS — E876 Hypokalemia: Secondary | ICD-10-CM | POA: Diagnosis not present

## 2019-02-14 MED ORDER — SODIUM CHLORIDE 0.9% FLUSH
10.0000 mL | INTRAVENOUS | Status: DC | PRN
  Administered 2019-02-14: 10 mL via INTRAVENOUS
  Filled 2019-02-14: qty 10

## 2019-02-14 MED ORDER — SODIUM CHLORIDE 0.9 % IV SOLN
Freq: Once | INTRAVENOUS | Status: AC
  Administered 2019-02-14: 15:00:00 via INTRAVENOUS
  Filled 2019-02-14: qty 250

## 2019-02-14 MED ORDER — HEPARIN SOD (PORK) LOCK FLUSH 100 UNIT/ML IV SOLN
500.0000 [IU] | Freq: Once | INTRAVENOUS | Status: AC
  Administered 2019-02-14: 500 [IU] via INTRAVENOUS
  Filled 2019-02-14: qty 5

## 2019-02-15 ENCOUNTER — Ambulatory Visit: Payer: Medicare Other

## 2019-02-16 ENCOUNTER — Ambulatory Visit (INDEPENDENT_AMBULATORY_CARE_PROVIDER_SITE_OTHER): Payer: Medicare Other | Admitting: Obstetrics and Gynecology

## 2019-02-16 ENCOUNTER — Encounter: Payer: Self-pay | Admitting: Obstetrics and Gynecology

## 2019-02-16 ENCOUNTER — Ambulatory Visit: Payer: Medicare Other

## 2019-02-16 VITALS — BP 138/87 | HR 73 | Ht 59.0 in | Wt 222.8 lb

## 2019-02-16 DIAGNOSIS — C541 Malignant neoplasm of endometrium: Secondary | ICD-10-CM

## 2019-02-16 DIAGNOSIS — Z6841 Body Mass Index (BMI) 40.0 and over, adult: Secondary | ICD-10-CM

## 2019-02-16 NOTE — Progress Notes (Signed)
Pt is present today for follow up visit for uterine cancer. Pt stated that she is doing well no problems.

## 2019-02-16 NOTE — Progress Notes (Signed)
    GYNECOLOGY PROGRESS NOTE  Subjective:    Patient ID: Molly [name], female    DOB: 03/21/1951, 68 y.o.   MRN: [id number]  HPI  Patient is a 68 y.o. G52P2002 female who presents for 4 month follow up of endometrial cancer.  She was diagnosed with endometrial cancer in September after several bouts of post-menopausal bleeding with pessary use for pelvic organ prolapse. She underwent a total laparoscopic hysterectomy with bilateral salpingo-oophorectomy with pelvic lymph node dissection on 10/06/2018.  She has undergone radiation, and is currently undergoing chemotherapy treatments (statess she is excited as she only has 1 round left).   Final pathology showed: Endometrioid carcinoma FIGO grade 2 with 33% myometrial invasion.  Lymphovascular invasion present.  Peritoneal/ascites fluid atypical.  2 out of 3 sentinel lymph nodes were positive for macro metastases.  pT1a pN1a.  FIGO IIIC1.   Patient today notes no complaints.  States she is feeling fairly well.  She states that she notes she has been gaining some weight due to the overwhelming support of family and friends bringing meals.   The following portions of the patient's history were reviewed and updated as appropriate: allergies, current medications, past family history, past medical history, past social history, past surgical history and problem list.  Review of Systems Pertinent items noted in HPI and remainder of comprehensive ROS otherwise negative.   Objective:   Blood pressure 138/87, pulse 73, height 4\' 11"  (1.499 m), weight 222 lb 12.8 oz (101.1 kg).  Body mass index is 45 kg/m.   General appearance: alert and no distress Abdomen: soft, non-tender; bowel sounds normal; no masses,  no organomegaly. Large well-healed scar across right mid-abdomen.  Pelvic: external genitalia normal (vulvar hair loss noted), rectovaginal septum normal.  Vagina without discharge.  No lesions or nodularity noted. Uterus and cervix surgically  absent. Lymph nodes: Cervical, supraclavicular, and inguinal nodes normal. Extremities: extremities normal, atraumatic, no cyanosis or edema Neurologic: Grossly normal   Assessment:   Endometrial cancer s/p LAVH/BSO with lymph node dissection. Currently s/p radiation treatment, currently undergoing chemotherapy.  Obesity  Plan:   - Continue chemotherapy treatments for endometrial cancer. Doing well. No evidence of disease noted on today's exam. Patient notes that she has 1 more treatment due.  Will need to continue q 3-4 month follow up alternating with Oncology and GYN.   - Obesity.  Discussed healthy dietary habits with patient. Once she has completed therapy, can also encourage a more active lifestyle.    Rubie Maid, MD [facility]

## 2019-02-17 ENCOUNTER — Inpatient Hospital Stay: Payer: Medicare Other

## 2019-02-17 ENCOUNTER — Other Ambulatory Visit: Payer: Self-pay | Admitting: *Deleted

## 2019-02-17 ENCOUNTER — Ambulatory Visit: Payer: Medicare Other

## 2019-02-17 VITALS — BP 130/81 | HR 64 | Temp 96.9°F | Resp 18

## 2019-02-17 DIAGNOSIS — E876 Hypokalemia: Secondary | ICD-10-CM

## 2019-02-17 DIAGNOSIS — D509 Iron deficiency anemia, unspecified: Secondary | ICD-10-CM

## 2019-02-17 DIAGNOSIS — Z5111 Encounter for antineoplastic chemotherapy: Secondary | ICD-10-CM | POA: Diagnosis not present

## 2019-02-17 DIAGNOSIS — C541 Malignant neoplasm of endometrium: Secondary | ICD-10-CM

## 2019-02-17 DIAGNOSIS — R531 Weakness: Secondary | ICD-10-CM | POA: Diagnosis not present

## 2019-02-17 DIAGNOSIS — M898X9 Other specified disorders of bone, unspecified site: Secondary | ICD-10-CM | POA: Diagnosis not present

## 2019-02-17 LAB — BASIC METABOLIC PANEL
Anion gap: 8 (ref 5–15)
BUN: 14 mg/dL (ref 8–23)
CO2: 29 mmol/L (ref 22–32)
Calcium: 8.9 mg/dL (ref 8.9–10.3)
Chloride: 103 mmol/L (ref 98–111)
Creatinine, Ser: 0.66 mg/dL (ref 0.44–1.00)
GFR calc Af Amer: >60 mL/min (ref >60)
GFR calc non Af Amer: >60 mL/min (ref >60)
Glucose, Bld: 111 mg/dL — ABNORMAL HIGH (ref 70–99)
Potassium: 3.3 mmol/L — ABNORMAL LOW (ref 3.5–5.1)
Sodium: 140 mmol/L (ref 135–145)

## 2019-02-17 MED ORDER — SODIUM CHLORIDE 0.9 % IV SOLN
Freq: Once | INTRAVENOUS | Status: AC
  Administered 2019-02-17: 14:00:00 via INTRAVENOUS
  Filled 2019-02-17: qty 1000

## 2019-02-17 MED ORDER — HEPARIN SOD (PORK) LOCK FLUSH 100 UNIT/ML IV SOLN
500.0000 [IU] | Freq: Once | INTRAVENOUS | Status: AC
  Administered 2019-02-17: 500 [IU] via INTRAVENOUS

## 2019-02-17 MED ORDER — DEXAMETHASONE SODIUM PHOSPHATE 10 MG/ML IJ SOLN
10.0000 mg | Freq: Once | INTRAMUSCULAR | Status: AC
  Administered 2019-02-17: 10 mg via INTRAVENOUS

## 2019-02-17 MED ORDER — POTASSIUM CHLORIDE CRYS ER 20 MEQ PO TBCR: 20.0000 meq | EXTENDED_RELEASE_TABLET | Freq: Every day | ORAL | 0 refills | Status: DC

## 2019-02-17 MED ORDER — SODIUM CHLORIDE 0.9 % IV SOLN: 10.0000 mg | Freq: Once | INTRAVENOUS | Status: DC

## 2019-02-17 MED ORDER — DEXAMETHASONE SODIUM PHOSPHATE 10 MG/ML IJ SOLN
INTRAMUSCULAR | Status: AC
  Filled 2019-02-17: qty 1

## 2019-02-17 MED ORDER — HEPARIN SOD (PORK) LOCK FLUSH 100 UNIT/ML IV SOLN
INTRAVENOUS | Status: AC
  Filled 2019-02-17: qty 5

## 2019-02-17 MED ORDER — SODIUM CHLORIDE 0.9% FLUSH
10.0000 mL | INTRAVENOUS | Status: DC | PRN
  Administered 2019-02-17: 10 mL via INTRAVENOUS
  Filled 2019-02-17: qty 10

## 2019-02-18 ENCOUNTER — Inpatient Hospital Stay: Payer: Medicare Other

## 2019-02-18 ENCOUNTER — Ambulatory Visit: Payer: Medicare Other

## 2019-02-21 ENCOUNTER — Ambulatory Visit: Payer: Medicare Other

## 2019-02-24 ENCOUNTER — Ambulatory Visit: Payer: Medicare Other

## 2019-02-24 ENCOUNTER — Other Ambulatory Visit: Payer: Self-pay | Admitting: *Deleted

## 2019-02-25 ENCOUNTER — Other Ambulatory Visit: Payer: Self-pay

## 2019-02-25 ENCOUNTER — Inpatient Hospital Stay (HOSPITAL_BASED_OUTPATIENT_CLINIC_OR_DEPARTMENT_OTHER): Payer: Medicare Other | Admitting: Oncology

## 2019-02-25 ENCOUNTER — Inpatient Hospital Stay: Payer: Medicare Other

## 2019-02-25 VITALS — BP 139/82 | HR 70 | Temp 97.5°F | Ht 59.0 in | Wt 221.1 lb

## 2019-02-25 DIAGNOSIS — Z5111 Encounter for antineoplastic chemotherapy: Secondary | ICD-10-CM | POA: Diagnosis not present

## 2019-02-25 DIAGNOSIS — G620 Drug-induced polyneuropathy: Secondary | ICD-10-CM

## 2019-02-25 DIAGNOSIS — T451X5A Adverse effect of antineoplastic and immunosuppressive drugs, initial encounter: Secondary | ICD-10-CM

## 2019-02-25 DIAGNOSIS — M898X9 Other specified disorders of bone, unspecified site: Secondary | ICD-10-CM | POA: Diagnosis not present

## 2019-02-25 DIAGNOSIS — C541 Malignant neoplasm of endometrium: Secondary | ICD-10-CM | POA: Diagnosis not present

## 2019-02-25 DIAGNOSIS — E876 Hypokalemia: Secondary | ICD-10-CM | POA: Diagnosis not present

## 2019-02-25 DIAGNOSIS — R531 Weakness: Secondary | ICD-10-CM | POA: Diagnosis not present

## 2019-02-25 LAB — CBC WITH DIFFERENTIAL/PLATELET
Abs Immature Granulocytes: 0.02 10*3/uL (ref 0.00–0.07)
BASOS PCT: 0 %
Basophils Absolute: 0 10*3/uL (ref 0.0–0.1)
EOS PCT: 0 %
Eosinophils Absolute: 0 10*3/uL (ref 0.0–0.5)
HCT: 30.8 % — ABNORMAL LOW (ref 36.0–46.0)
Hemoglobin: 10.3 g/dL — ABNORMAL LOW (ref 12.0–15.0)
Immature Granulocytes: 0 %
Lymphocytes Relative: 29 %
Lymphs Abs: 1.3 10*3/uL (ref 0.7–4.0)
MCH: 31.8 pg (ref 26.0–34.0)
MCHC: 33.4 g/dL (ref 30.0–36.0)
MCV: 95.1 fL (ref 80.0–100.0)
Monocytes Absolute: 0.5 10*3/uL (ref 0.1–1.0)
Monocytes Relative: 11 %
Neutro Abs: 2.7 10*3/uL (ref 1.7–7.7)
Neutrophils Relative %: 60 %
Platelets: 223 10*3/uL (ref 150–400)
RBC: 3.24 MIL/uL — AB (ref 3.87–5.11)
RDW: 15.2 % (ref 11.5–15.5)
WBC: 4.6 10*3/uL (ref 4.0–10.5)
nRBC: 0 % (ref 0.0–0.2)

## 2019-02-25 LAB — COMPREHENSIVE METABOLIC PANEL
ALT: 14 U/L (ref 0–44)
AST: 20 U/L (ref 15–41)
Albumin: 3.8 g/dL (ref 3.5–5.0)
Alkaline Phosphatase: 68 U/L (ref 38–126)
Anion gap: 8 (ref 5–15)
BUN: 11 mg/dL (ref 8–23)
CO2: 26 mmol/L (ref 22–32)
CREATININE: 0.8 mg/dL (ref 0.44–1.00)
Calcium: 8.6 mg/dL — ABNORMAL LOW (ref 8.9–10.3)
Chloride: 104 mmol/L (ref 98–111)
GFR calc Af Amer: >60 mL/min (ref >60)
Glucose, Bld: 112 mg/dL — ABNORMAL HIGH (ref 70–99)
Potassium: 3.6 mmol/L (ref 3.5–5.1)
Sodium: 138 mmol/L (ref 135–145)
Total Bilirubin: 0.5 mg/dL (ref 0.3–1.2)
Total Protein: 6.9 g/dL (ref 6.5–8.1)

## 2019-02-25 MED ORDER — FAMOTIDINE IN NACL 20-0.9 MG/50ML-% IV SOLN
20.0000 mg | Freq: Once | INTRAVENOUS | Status: AC
  Administered 2019-02-25: 20 mg via INTRAVENOUS
  Filled 2019-02-25: qty 50

## 2019-02-25 MED ORDER — SODIUM CHLORIDE 0.9 % IV SOLN
540.0000 mg | Freq: Once | INTRAVENOUS | Status: AC
  Administered 2019-02-25: 540 mg via INTRAVENOUS
  Filled 2019-02-25: qty 54

## 2019-02-25 MED ORDER — SODIUM CHLORIDE 0.9% FLUSH
10.0000 mL | Freq: Once | INTRAVENOUS | Status: AC
  Administered 2019-02-25: 10 mL via INTRAVENOUS
  Filled 2019-02-25: qty 10

## 2019-02-25 MED ORDER — PALONOSETRON HCL INJECTION 0.25 MG/5ML
0.2500 mg | Freq: Once | INTRAVENOUS | Status: AC
  Administered 2019-02-25: 0.25 mg via INTRAVENOUS
  Filled 2019-02-25: qty 5

## 2019-02-25 MED ORDER — SODIUM CHLORIDE 0.9 % IV SOLN
20.0000 mg | Freq: Once | INTRAVENOUS | Status: AC
  Administered 2019-02-25: 20 mg via INTRAVENOUS
  Filled 2019-02-25: qty 2

## 2019-02-25 MED ORDER — SODIUM CHLORIDE 0.9 % IV SOLN
150.0000 mg/m2 | Freq: Once | INTRAVENOUS | Status: AC
  Administered 2019-02-25: 300 mg via INTRAVENOUS
  Filled 2019-02-25: qty 50

## 2019-02-25 MED ORDER — SODIUM CHLORIDE 0.9 % IV SOLN
Freq: Once | INTRAVENOUS | Status: AC
  Administered 2019-02-25: 10:00:00 via INTRAVENOUS
  Filled 2019-02-25: qty 250

## 2019-02-25 MED ORDER — HEPARIN SOD (PORK) LOCK FLUSH 100 UNIT/ML IV SOLN: 500.0000 [IU] | Freq: Once | INTRAVENOUS | Status: DC | PRN

## 2019-02-25 MED ORDER — DIPHENHYDRAMINE HCL 50 MG/ML IJ SOLN
50.0000 mg | Freq: Once | INTRAMUSCULAR | Status: AC
  Administered 2019-02-25: 50 mg via INTRAVENOUS
  Filled 2019-02-25: qty 1

## 2019-02-25 MED ORDER — HEPARIN SOD (PORK) LOCK FLUSH 100 UNIT/ML IV SOLN
500.0000 [IU] | Freq: Once | INTRAVENOUS | Status: AC
  Administered 2019-02-25: 500 [IU] via INTRAVENOUS
  Filled 2019-02-25: qty 5

## 2019-02-25 NOTE — Progress Notes (Signed)
Patient stated that the Sunday before last she had chills with no fever. However, she felt cold and tried to warm up with blankets until she broke out and started to sweat it out. Then she felt better since then.

## 2019-02-28 ENCOUNTER — Inpatient Hospital Stay: Payer: Medicare Other | Attending: Oncology

## 2019-02-28 ENCOUNTER — Ambulatory Visit: Payer: Medicare Other

## 2019-02-28 DIAGNOSIS — E860 Dehydration: Secondary | ICD-10-CM | POA: Diagnosis not present

## 2019-02-28 DIAGNOSIS — C541 Malignant neoplasm of endometrium: Secondary | ICD-10-CM | POA: Insufficient documentation

## 2019-02-28 DIAGNOSIS — D759 Disease of blood and blood-forming organs, unspecified: Secondary | ICD-10-CM | POA: Diagnosis not present

## 2019-02-28 DIAGNOSIS — R002 Palpitations: Secondary | ICD-10-CM | POA: Insufficient documentation

## 2019-02-28 DIAGNOSIS — R531 Weakness: Secondary | ICD-10-CM | POA: Diagnosis not present

## 2019-02-28 DIAGNOSIS — D701 Agranulocytosis secondary to cancer chemotherapy: Secondary | ICD-10-CM | POA: Insufficient documentation

## 2019-02-28 MED ORDER — SODIUM CHLORIDE 0.9 % IV SOLN
Freq: Once | INTRAVENOUS | Status: AC
  Administered 2019-02-28: 14:00:00 via INTRAVENOUS
  Filled 2019-02-28: qty 250

## 2019-02-28 MED ORDER — SODIUM CHLORIDE 0.9% FLUSH
10.0000 mL | INTRAVENOUS | Status: DC | PRN
  Administered 2019-02-28: 10 mL via INTRAVENOUS
  Filled 2019-02-28: qty 10

## 2019-02-28 MED ORDER — HEPARIN SOD (PORK) LOCK FLUSH 100 UNIT/ML IV SOLN
500.0000 [IU] | Freq: Once | INTRAVENOUS | Status: AC
  Administered 2019-02-28: 500 [IU] via INTRAVENOUS

## 2019-03-01 NOTE — Progress Notes (Signed)
Hematology/Oncology Consult note [facility]  [phone] [phone]  Patient Care Team: Steele Sizer, MD as PCP - General (Family Medicine) Rubie Maid, MD as Consulting Physician (Obstetrics and Gynecology) Clent Jacks, RN as Registered Nurse   Name of the patient: Molly [name]  [id number]  [birth year]   Date of visit: 03/01/19  Diagnosis-  invasive adenocarcinoma of the endometrium endometrioid subtype FIGO Stage IIIC2pT1a pN2M0  Chief complaint/ Reason for visit-on treatment assessment prior to cycle 6 of carboplatin and Taxol chemotherapy  Heme/Onc history: patient is a 67 year old female who follows up with Dr. Marcelline Mates for cystocele and rectocele. She was noted to have postmenopausal bleeding and cramping recently. Ultrasound revealed heterogeneous appearance of uterus with fibroids invading the endometrium. She underwent an endometrial biopsy when she had a second bout of postmenopausal bleeding. Biopsy showed endometrioid adenocarcinoma FIGO grade 1. She was then seen by Dr. Fransisca Connors and plan was to proceed with laparoscopic hysterectomy and bilateral salpingo-oophorectomy along with pelvic lymph node sampling  Final pathology showed: Endometrioid carcinoma FIGO grade 2 with 33% myometrial invasion. Lymphovascular invasion present. Peritoneal/ascites fluid atypical. 2 out of 3 sentinel lymph nodes were positive for macro metastases. pT1a pN1a. FIGO IIIC1  Patient's case was discussed at tumor board and consensus was to proceed with 3 cycles of adjuvant carbotaxol followed by radiation followed by 3 more cycles of chemotherapy. Patient is already met with Dr. Donella Stade who will be giving radiation therapy to pelvic and periaortic lymph nodes and boost to her vaginal cuff. PET CT scan showed metastatic adenopathy and retroperitoneal and external iliac group of lymph nodes in the right side. No evidence of metastatic  disease elsewhere.  First cycle of chemotherapy was given on 11/05/2018    Interval history- she is tolerating chemotherapy well and reports mild neuropathy in her fingers.  She has been getting IV fluids twice a week which has been helping her.  ECOG PS- 1 Pain scale- 0 Opioid associated constipation- no  Review of systems- Review of Systems  Constitutional: Positive for malaise/fatigue. Negative for chills, fever and weight loss.  HENT: Negative for congestion, ear discharge and nosebleeds.   Eyes: Negative for blurred vision.  Respiratory: Negative for cough, hemoptysis, sputum production, shortness of breath and wheezing.   Cardiovascular: Negative for chest pain, palpitations, orthopnea and claudication.  Gastrointestinal: Negative for abdominal pain, blood in stool, constipation, diarrhea, heartburn, melena, nausea and vomiting.  Genitourinary: Negative for dysuria, flank pain, frequency, hematuria and urgency.  Musculoskeletal: Negative for back pain, joint pain and myalgias.  Skin: Negative for rash.  Neurological: Positive for sensory change (peripheral neuropathy). Negative for dizziness, tingling, focal weakness, seizures, weakness and headaches.  Endo/Heme/Allergies: Does not bruise/bleed easily.  Psychiatric/Behavioral: Negative for depression and suicidal ideas. The patient does not have insomnia.       Allergies  Allergen Reactions  . Ferumoxytol Anaphylaxis  . Nsaids Hives and Rash  . Statins Other (See Comments)    Joint pains  . Victoza [Liraglutide] Other (See Comments)    pancreatitis  . Oxycodone Nausea And Vomiting  . Crestor [Rosuvastatin Calcium] Rash     Past Medical History:  Diagnosis Date  . Anxiety   . Asthma    history of asthma  . Cardiomyopathy ([place])   . Complication of anesthesia    tore hair out and made teeth rough  . Depression   . Endometrial cancer ([place]) 08/2018  . GERD (gastroesophageal reflux disease)    takes  prilosec prn   . History of CVA (cerebrovascular accident) 12/10/2015  . Hyperlipidemia LDL goal <70 12/10/2015  . Hypertension   . Prediabetes 10/02/2017   A1c 6 in January 2018  . Stroke [facility]) 1990    no residual effects     Past Surgical History:  Procedure Laterality Date  . ABDOMINAL HYSTERECTOMY    . CHOLECYSTECTOMY    . COLONOSCOPY WITH PROPOFOL N/A 01/08/2017   Procedure: COLONOSCOPY WITH PROPOFOL;  Surgeon: [name], MD;  Location: [facility] ENDOSCOPY;  Service: Endoscopy;  Laterality: N/A;  . COLONOSCOPY WITH PROPOFOL N/A 05/05/2018   Procedure: COLONOSCOPY WITH PROPOFOL;  Surgeon: [name], MD;  Location: [facility] ENDOSCOPY;  Service: Gastroenterology;  Laterality: N/A;  . HERNIA REPAIR  76/2831   umbilical  . PORTACATH PLACEMENT N/A 11/04/2018   Procedure: INSERTION PORT-A-CATH;  Surgeon: Jules Husbands, MD;  Location: [facility] ORS;  Service: General;  Laterality: N/A;  . SENTINEL NODE BIOPSY N/A 10/06/2018   Procedure: SENTINEL NODE BIOPSY;  Surgeon: Mellody Drown, MD;  Location: [facility] ORS;  Service: Gynecology;  Laterality: N/A;  . UMBILICAL HERNIA REPAIR N/A 11/17/2017   Procedure: HERNIA REPAIR UMBILICAL ADULT;  Surgeon: Jules Husbands, MD;  Location: [facility] ORS;  Service: General;  Laterality: N/A;    Social History   Socioeconomic History  . Marital status: Divorced    Spouse name: Not on file  . Number of children: 2  . Years of education: some college  . Highest education level: 12th grade  Occupational History  . 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 Occupation: currently a Careers adviser  . Financial resource strain: Not hard at all  . Food insecurity:    Worry: Never true    Inability: Never true  . Transportation needs:    Medical: No    Non-medical: No  Tobacco Use  . Smoking status: Never Smoker  . Smokeless tobacco: Never Used  . Tobacco comment: smoking cessation materials not required  Substance and Sexual Activity  . Alcohol use:  No    Alcohol/week: 0.0 standard drinks  . Drug use: No  . Sexual activity: Not Currently  Lifestyle  . Physical activity:    Days per week: 0 days    Minutes per session: 0 min  . Stress: Not at all  Relationships  . Social connections:    Talks on phone: More than three times a week    Gets together: Three times a week    Attends religious service: More than 4 times per year    Active member of club or organization: Yes    Attends meetings of clubs or organizations: More than 4 times per year    Relationship status: Divorced  . Intimate partner violence:    Fear of current or ex partner: No    Emotionally abused: No    Physically abused: No    Forced sexual activity: No  Other Topics Concern  . Not on file  Social History Narrative  . Not on file    Family History  Problem Relation Age of Onset  . Stroke Mother   . Hypertension Mother   . Dementia Mother   . Alzheimer's disease Mother   . Gout Father   . Asthma Father   . Hypertension Father   . Dementia Father   . Healthy Sister   . Stroke Brother   . Alzheimer's disease Brother   . Healthy Daughter   .  Hypertension Brother   . Healthy Brother   . Cancer Paternal Grandmother   . Healthy Sister   . Healthy Sister   . Hypertension Sister   . Hypertension Sister   . Stroke Brother   . Alzheimer's disease Brother   . Stroke Brother   . Hypertension Brother   . Stroke Brother   . Hypertension Brother   . Healthy Brother   . Healthy Brother   . Hypertension Daughter   . Breast cancer Neg Hx      Current Outpatient Medications:  .  acetaminophen (TYLENOL) 500 MG tablet, Take 2 tablets (1,000 mg total) by mouth every 6 (six) hours as needed for mild pain or moderate pain., Disp: 30 tablet, Rfl: 0 .  albuterol (PROVENTIL HFA;VENTOLIN HFA) 108 (90 Base) MCG/ACT inhaler, Inhale 2 puffs into the lungs every 4 (four) hours as needed for wheezing or shortness of breath., Disp: 1 Inhaler, Rfl: 0 .  dexamethasone  (DECADRON) 4 MG tablet, Take 2 tablets (8 mg total) by mouth daily. Start the day after chemotherapy for 2 days., Disp: 30 tablet, Rfl: 1 .  diclofenac sodium (VOLTAREN) 1 % GEL, Apply 2 g topically 3 (three) times daily as needed (pain)., Disp: 100 g, Rfl: 1 .  escitalopram (LEXAPRO) 10 MG tablet, Take 1 tablet (10 mg total) by mouth daily., Disp: 90 tablet, Rfl: 0 .  ezetimibe (ZETIA) 10 MG tablet, Take 1 tablet (10 mg total) by mouth daily., Disp: 90 tablet, Rfl: 0 .  fluticasone furoate-vilanterol (BREO ELLIPTA) 100-25 MCG/INH AEPB, Inhale 1 puff into the lungs daily., Disp: 60 each, Rfl: 0 .  furosemide (LASIX) 40 MG tablet, Take 1 tablet (40 mg total) by mouth daily as needed for fluid., Disp: 90 tablet, Rfl: 0 .  ketoconazole (NIZORAL) 2 % cream, Apply 1 application topically daily as needed for irritation. On abdominal fold prn, Disp: 60 g, Rfl: 0 .  lidocaine-prilocaine (EMLA) cream, Apply to affected area once, Disp: 30 g, Rfl: 3 .  loratadine (CLARITIN) 10 MG tablet, Take 1 tablet by mouth daily., Disp: 30 tablet, Rfl: 5 .  LORazepam (ATIVAN) 0.5 MG tablet, Take 1 tablet (0.5 mg total) by mouth every 6 (six) hours as needed (Nausea or vomiting)., Disp: 30 tablet, Rfl: 0 .  magnesium oxide (MAG-OX) 400 (241.3 Mg) MG tablet, Take 1 tablet (400 mg total) by mouth daily. For hypomagnesemia, Disp: 30 tablet, Rfl: 0 .  morphine (MSIR) 15 MG tablet, Take 1 tablet (15 mg total) by mouth every 6 (six) hours as needed for severe pain., Disp: 20 tablet, Rfl: 0 .  olmesartan-hydrochlorothiazide (BENICAR HCT) 20-12.5 MG tablet, Take 1 tablet by mouth daily., Disp: 90 tablet, Rfl: 0 .  omeprazole (PRILOSEC OTC) 20 MG tablet, Take 20 mg by mouth daily as needed., Disp: , Rfl:  .  ondansetron (ZOFRAN) 8 MG tablet, Take 1 tablet (8 mg total) by mouth 2 (two) times daily as needed for refractory nausea / vomiting. Start on day 3 after chemo., Disp: 30 tablet, Rfl: 1 .  potassium chloride SA (K-DUR,KLOR-CON)  20 MEQ tablet, Take 1 tablet (20 mEq total) by mouth daily., Disp: 30 tablet, Rfl: 0 .  pregabalin (LYRICA) 50 MG capsule, Take 1 capsule (50 mg total) by mouth 2 (two) times daily., Disp: 60 capsule, Rfl: 1 .  prochlorperazine (COMPAZINE) 10 MG tablet, Take 10 mg by mouth every 6 (six) hours as needed for nausea or vomiting., Disp: , Rfl:  .  traMADol (ULTRAM) 50  MG tablet, Take 1 tablet (50 mg total) by mouth every 6 (six) hours as needed., Disp: 15 tablet, Rfl: 0 .  triamcinolone ointment (KENALOG) 0.5 %, Apply 1 application topically 2 (two) times daily., Disp: 30 g, Rfl: 0 No current facility-administered medications for this visit.   Facility-Administered Medications Ordered in Other Visits:  .  sodium chloride flush (NS) 0.9 % injection 10 mL, 10 mL, Intravenous, PRN, Sindy Guadeloupe, MD, 10 mL at 11/15/18 0915  Physical exam:  Vitals:   02/25/19 0845  BP: 139/82  Pulse: 70  Temp: (!) 97.5 F (36.4 C)  TempSrc: Tympanic  Weight: 221 lb 1.6 oz (100.3 kg)  Height: 4' 11"  (1.499 m)   Physical Exam HENT:     Head: Normocephalic and atraumatic.  Eyes:     Pupils: Pupils are equal, round, and reactive to light.  Neck:     Musculoskeletal: Normal range of motion.  Cardiovascular:     Rate and Rhythm: Normal rate and regular rhythm.     Heart sounds: Normal heart sounds.  Pulmonary:     Effort: Pulmonary effort is normal.     Breath sounds: Normal breath sounds.  Abdominal:     General: Bowel sounds are normal.     Palpations: Abdomen is soft.  Skin:    General: Skin is warm and dry.  Neurological:     Mental Status: She is alert and oriented to person, place, and time.      CMP Latest Ref Rng & Units 02/25/2019  Glucose 70 - 99 mg/dL 112(H)  BUN 8 - 23 mg/dL 11  Creatinine 0.44 - 1.00 mg/dL 0.80  Sodium 135 - 145 mmol/L 138  Potassium 3.5 - 5.1 mmol/L 3.6  Chloride 98 - 111 mmol/L 104  CO2 22 - 32 mmol/L 26  Calcium 8.9 - 10.3 mg/dL 8.6(L)  Total Protein 6.5 - 8.1  g/dL 6.9  Total Bilirubin 0.3 - 1.2 mg/dL 0.5  Alkaline Phos 38 - 126 U/L 68  AST 15 - 41 U/L 20  ALT 0 - 44 U/L 14   CBC Latest Ref Rng & Units 02/25/2019  WBC 4.0 - 10.5 K/uL 4.6  Hemoglobin 12.0 - 15.0 g/dL 10.3(L)  Hematocrit 36.0 - 46.0 % 30.8(L)  Platelets 150 - 400 K/uL 223     Assessment and plan- Patient is a 68 y.o. female   invasive adenocarcinoma of the endometrium endometrioid subtypeFIGO Stage IIIC2 pT1a N2 M0. She is here for on treatment assessment prior to cycle 6 of carbotaxol chemotherapy  Counts okay to proceed with cycle 6 of carbotaxol chemotherapy today.  This is her last chemo and she has done well without Neulasta.  She will continue to get IV fluids 2 times a week for the next 3 weeks.  Chemo-induced peripheral neuropathy: Mild grade 1.  She is currently on Neurontin and I will also refer her to acupuncture  She is due for repeat CT chest abdomen pelvis on 03/04/2019 and I will see her back on 03/07/2019 to discuss the results of her scans.  She will be seen by Dr. Donella Stade thereafter to proceed with external beam radiation and I did speak to Dr. Donella Stade about this as well   Visit Diagnosis 1. Endometrial adenocarcinoma ([place])   2. Encounter for antineoplastic chemotherapy   3. Chemotherapy-induced peripheral neuropathy ([employer])      Dr. Randa Evens, MD, MPH [facility] [phone] 03/01/2019 8:20 AM

## 2019-03-02 ENCOUNTER — Inpatient Hospital Stay: Payer: Medicare Other

## 2019-03-02 ENCOUNTER — Other Ambulatory Visit: Payer: Self-pay | Admitting: *Deleted

## 2019-03-02 ENCOUNTER — Telehealth: Payer: Self-pay

## 2019-03-02 ENCOUNTER — Inpatient Hospital Stay (HOSPITAL_BASED_OUTPATIENT_CLINIC_OR_DEPARTMENT_OTHER): Payer: Medicare Other | Admitting: Oncology

## 2019-03-02 ENCOUNTER — Other Ambulatory Visit: Payer: Self-pay

## 2019-03-02 DIAGNOSIS — Z136 Encounter for screening for cardiovascular disorders: Secondary | ICD-10-CM | POA: Diagnosis not present

## 2019-03-02 DIAGNOSIS — C541 Malignant neoplasm of endometrium: Secondary | ICD-10-CM

## 2019-03-02 DIAGNOSIS — E860 Dehydration: Secondary | ICD-10-CM

## 2019-03-02 DIAGNOSIS — R5382 Chronic fatigue, unspecified: Secondary | ICD-10-CM

## 2019-03-02 DIAGNOSIS — R002 Palpitations: Secondary | ICD-10-CM

## 2019-03-02 DIAGNOSIS — D509 Iron deficiency anemia, unspecified: Secondary | ICD-10-CM

## 2019-03-02 DIAGNOSIS — R5383 Other fatigue: Secondary | ICD-10-CM

## 2019-03-02 DIAGNOSIS — D701 Agranulocytosis secondary to cancer chemotherapy: Secondary | ICD-10-CM | POA: Diagnosis not present

## 2019-03-02 DIAGNOSIS — D759 Disease of blood and blood-forming organs, unspecified: Secondary | ICD-10-CM | POA: Diagnosis not present

## 2019-03-02 DIAGNOSIS — R531 Weakness: Secondary | ICD-10-CM

## 2019-03-02 LAB — CBC WITH DIFFERENTIAL/PLATELET
Abs Immature Granulocytes: 0 10*3/uL (ref 0.00–0.07)
Basophils Absolute: 0 10*3/uL (ref 0.0–0.1)
Basophils Relative: 0 %
Eosinophils Absolute: 0 10*3/uL (ref 0.0–0.5)
Eosinophils Relative: 0 %
HCT: 31.2 % — ABNORMAL LOW (ref 36.0–46.0)
Hemoglobin: 10.5 g/dL — ABNORMAL LOW (ref 12.0–15.0)
Immature Granulocytes: 0 %
Lymphocytes Relative: 57 %
Lymphs Abs: 1.5 10*3/uL (ref 0.7–4.0)
MCH: 31.8 pg (ref 26.0–34.0)
MCHC: 33.7 g/dL (ref 30.0–36.0)
MCV: 94.5 fL (ref 80.0–100.0)
Monocytes Absolute: 0 10*3/uL — ABNORMAL LOW (ref 0.1–1.0)
Monocytes Relative: 2 %
Neutro Abs: 1.1 10*3/uL — ABNORMAL LOW (ref 1.7–7.7)
Neutrophils Relative %: 41 %
Platelets: 232 10*3/uL (ref 150–400)
RBC: 3.3 MIL/uL — ABNORMAL LOW (ref 3.87–5.11)
RDW: 14.2 % (ref 11.5–15.5)
WBC: 2.6 10*3/uL — ABNORMAL LOW (ref 4.0–10.5)
nRBC: 0 % (ref 0.0–0.2)

## 2019-03-02 LAB — COMPREHENSIVE METABOLIC PANEL
ALT: 15 U/L (ref 0–44)
AST: 17 U/L (ref 15–41)
Albumin: 3.9 g/dL (ref 3.5–5.0)
Alkaline Phosphatase: 57 U/L (ref 38–126)
Anion gap: 7 (ref 5–15)
BUN: 17 mg/dL (ref 8–23)
CO2: 28 mmol/L (ref 22–32)
Calcium: 8.9 mg/dL (ref 8.9–10.3)
Chloride: 101 mmol/L (ref 98–111)
Creatinine, Ser: 0.75 mg/dL (ref 0.44–1.00)
GFR calc Af Amer: >60 mL/min (ref >60)
GFR calc non Af Amer: >60 mL/min (ref >60)
Glucose, Bld: 108 mg/dL — ABNORMAL HIGH (ref 70–99)
Potassium: 3.5 mmol/L (ref 3.5–5.1)
Sodium: 136 mmol/L (ref 135–145)
Total Bilirubin: 1.1 mg/dL (ref 0.3–1.2)
Total Protein: 6.6 g/dL (ref 6.5–8.1)

## 2019-03-02 MED ORDER — DEXAMETHASONE SODIUM PHOSPHATE 10 MG/ML IJ SOLN
10.0000 mg | Freq: Once | INTRAMUSCULAR | Status: AC
  Administered 2019-03-02: 10 mg via INTRAVENOUS
  Filled 2019-03-02: qty 1

## 2019-03-02 MED ORDER — SODIUM CHLORIDE 0.9 % IV SOLN: 10.0000 mg | Freq: Once | INTRAVENOUS | Status: DC

## 2019-03-02 MED ORDER — SODIUM CHLORIDE 0.9% FLUSH
10.0000 mL | Freq: Once | INTRAVENOUS | Status: DC | PRN
  Filled 2019-03-02: qty 10

## 2019-03-02 MED ORDER — HEPARIN SOD (PORK) LOCK FLUSH 100 UNIT/ML IV SOLN
500.0000 [IU] | Freq: Once | INTRAVENOUS | Status: AC | PRN
  Administered 2019-03-02: 500 [IU]
  Filled 2019-03-02: qty 5

## 2019-03-02 MED ORDER — SODIUM CHLORIDE 0.9 % IV SOLN
Freq: Once | INTRAVENOUS | Status: AC
  Administered 2019-03-02: 11:00:00 via INTRAVENOUS
  Filled 2019-03-02: qty 250

## 2019-03-02 NOTE — Progress Notes (Signed)
Symptom Management Consult note [facility]  [phone] [phone]  Patient Care Team: Steele Sizer, MD as PCP - General (Family Medicine) Rubie Maid, MD as Consulting Physician (Obstetrics and Gynecology) Clent Jacks, RN as Registered Nurse   Name of the patient: Molly [name]  [id number]  December 21, 1951   Date of visit: 03/02/2019  Diagnosis: Endometrial carcinoma stage III  Chief Complaint: pallor/palpatations  Current Treatment: s/p 6 cycles Carbo/taxol.  Last given on 02/25/2019.   Oncology History: Patient last seen by primary oncologist Dr. Janese Banks on 02/25/2019 prior to cycle 6 of carbo/Taxol.  Appeared to tolerate chemotherapy well with only mild neuropathy.  Requiring IV fluids twice a week d/t dehydration and weakness.  Scheduled for repeat CT chest abdomen pelvis on 03/04/2019 and would return to clinic on 03/07/2019 discussed results.  Plan was to be seen by Dr. Donella Stade to proceed with external beam radiation.   She has been evaluated in [facility] on several occasions for weakness requiring IV fluids and electrolyte replacement.  Likely due to chemotherapy side effects.     Endometrial adenocarcinoma ([place])   10/28/2018 Cancer Staging    Staging form: Corpus Uteri - Carcinoma and Carcinosarcoma, AJCC 8th Edition - Clinical stage from 10/28/2018: FIGO Stage IIIC1 (cT1a, cN1a, cM0) - Signed by Sindy Guadeloupe, MD on 10/29/2018    10/29/2018 Initial Diagnosis    Endometrial adenocarcinoma ([place])    11/05/2018 -  Chemotherapy    The patient had palonosetron (ALOXI) injection 0.25 mg, 0.25 mg, Intravenous,  Once, 6 of 6 cycles Administration: 0.25 mg (11/05/2018), 0.25 mg (11/29/2018), 0.25 mg (12/20/2018), 0.25 mg (01/14/2019), 0.25 mg (02/04/2019), 0.25 mg (02/25/2019) pegfilgrastim (NEULASTA ONPRO KIT) injection 6 mg, 6 mg, Subcutaneous, Once, 2 of 2 cycles Administration: 6 mg (11/05/2018), 6 mg (11/29/2018) CARBOplatin (PARAPLATIN) 540 mg in  sodium chloride 0.9 % 250 mL chemo infusion, 540 mg (100 % of original dose 540 mg), Intravenous,  Once, 6 of 6 cycles Dose modification:   (original dose 540 mg, Cycle 1) Administration: 540 mg (11/05/2018), 540 mg (11/29/2018), 540 mg (12/20/2018), 540 mg (01/14/2019), 540 mg (02/04/2019), 540 mg (02/25/2019) PACLitaxel (TAXOL) 348 mg in sodium chloride 0.9 % 500 mL chemo infusion (> 1m/m2), 175 mg/m2 = 348 mg, Intravenous,  Once, 6 of 6 cycles Dose modification: 150 mg/m2 (original dose 175 mg/m2, Cycle 4, Reason: Other (see comments), Comment: neuropathy) Administration: 348 mg (11/05/2018), 348 mg (11/29/2018), 348 mg (12/20/2018), 300 mg (01/14/2019), 300 mg (02/04/2019), 300 mg (02/25/2019)  for chemotherapy treatment.      Subjective Data:  ECOG: 1 - Symptomatic but completely ambulatory    Subjective:    Molly CLAPPis a 68y.o. female who presents with a fluttering sensation and shortness of breath. The symptoms are mild, occur intermittently, and last a few seconds per episode. They tend to occur with exertion. Cardiac risk factors include: advanced age (older than 52for men, 689for women), dyslipidemia, hypertension, obesity (BMI >= 30 kg/m2) and sedentary lifestyle. Aggravating factors: none. Relieving factors: spontaneous. Associated symptoms: dizziness, palpitations, rapid heart beat and shortness of breath. Patient denies: abdominal pain, leg swelling, slow heart beat and syncope.  The following portions of the patient's history were reviewed and updated as appropriate: allergies, current medications, past family history, past medical history, past social history, past surgical history and problem list.  Review of Systems A comprehensive review of systems was negative except for: Constitutional: positive for fatigue and malaise Respiratory: positive for  dyspnea on exertion Cardiovascular: positive for dyspnea, irregular heart beat and orthopnea Gastrointestinal: positive for reflux  symptoms Genitourinary: positive for frequency Musculoskeletal: positive for muscle weakness Neurological: positive for dizziness, paresthesia and weakness   Objective:    There were no vitals taken for this visit. General appearance: alert, cooperative, fatigued, no distress, mildly obese and pale Lungs: clear to auscultation bilaterally Heart: regular rate and rhythm, S1, S2 normal, no murmur, click, rub or gallop Abdomen: normal findings: bowel sounds normal  Cardiographics ECG: normal sinus rhythm   Assessment:    Palpitations d/t dehydration and anemia  Plan:   Endometrial adenocarcinoma: s/p TLH-BSO with lymph node biopsy by Dr. Linna Hoff.  Pathology revealed endometrioid adenocarcinoma.  Case discussed at multidisciplinary case conference where 6 cycles of adjuvant carbo/Taxol followed by radiation. Plans to radiate pelvic and periaortic lymph nodes with boost to vaginal cuff.  Initiated chemotherapy on 11/05/2017 and has now completed 6 cycles on 12/28/2018.  Plan is for 3 additional cycles of carbotaxol prior to initiating radiation. Scheduled to RTC on 03/04/19 for CT scan and results with Dr. Janese Banks  On 03/07/19.   Weakness/malaise: Multifactorial. Labs look ok.   Palpitations: Likely d/t dehydration/anemia.  Will get EKG to rule out arrhythmias.  Encouraged hydration.  Cytopenias: d/t chemotherapy.  Monitor.  Plan:  Stat Labs.  (ANC 1.1, hgb 10.5) stable. Stat EKG. Normal Sinus rhythm  In clinic administration: 1 L NaCl. 10 mg Decadron.  New/changed prescriptions: Additional samples given of Ensure hydration packets.  Disposition: RTC on 03/04/2023 CT scan.  Patient may want additional fluids on Friday after CT scan.  Patient to call based on symptoms. RTC on 03/07/2019 for results with Dr. Janese Banks.  Greater than 50% was spent in counseling and coordination of care with this patient including but not limited to discussion of the relevant topics above (See A&P) including, but  not limited to diagnosis and management of acute and chronic medical conditions.   [name], NP 03/04/2019 9:51 AM

## 2019-03-02 NOTE — Telephone Encounter (Signed)
[facility] contacted [place] in regards to patient needing a referral for home health aide. Message was sent to [place] on 01/20/2019 for a referral, Dr. Ancil Boozer sent Nathanial Millman the referral. She sent it to [facility], [facility] [place] [facility] [place]. Notes was sent to [place]. No information or case has not been dealt with since then. I contacted referrals to see if they can get this started and I will contact Melissa first thing in the morning.

## 2019-03-04 ENCOUNTER — Inpatient Hospital Stay: Payer: Medicare Other

## 2019-03-04 ENCOUNTER — Other Ambulatory Visit: Payer: Self-pay

## 2019-03-04 ENCOUNTER — Encounter: Payer: Self-pay | Admitting: *Deleted

## 2019-03-04 ENCOUNTER — Ambulatory Visit
Admission: RE | Admit: 2019-03-04 | Discharge: 2019-03-04 | Disposition: A | Discharge status: Home / Self Care | Payer: Medicare Other | Source: Ambulatory Visit | Attending: Oncology | Admitting: Oncology

## 2019-03-04 VITALS — BP 141/82 | HR 63 | Temp 97.8°F | Resp 20

## 2019-03-04 DIAGNOSIS — D509 Iron deficiency anemia, unspecified: Secondary | ICD-10-CM

## 2019-03-04 DIAGNOSIS — D701 Agranulocytosis secondary to cancer chemotherapy: Secondary | ICD-10-CM | POA: Diagnosis not present

## 2019-03-04 DIAGNOSIS — C541 Malignant neoplasm of endometrium: Secondary | ICD-10-CM | POA: Diagnosis not present

## 2019-03-04 DIAGNOSIS — E860 Dehydration: Secondary | ICD-10-CM | POA: Diagnosis not present

## 2019-03-04 DIAGNOSIS — R002 Palpitations: Secondary | ICD-10-CM | POA: Diagnosis not present

## 2019-03-04 DIAGNOSIS — R531 Weakness: Secondary | ICD-10-CM | POA: Diagnosis not present

## 2019-03-04 DIAGNOSIS — D759 Disease of blood and blood-forming organs, unspecified: Secondary | ICD-10-CM | POA: Diagnosis not present

## 2019-03-04 MED ORDER — SODIUM CHLORIDE 0.9% FLUSH
10.0000 mL | Freq: Once | INTRAVENOUS | Status: AC | PRN
  Administered 2019-03-04: 10 mL
  Filled 2019-03-04: qty 10

## 2019-03-04 MED ORDER — IOHEXOL 300 MG/ML  SOLN
100.0000 mL | Freq: Once | INTRAMUSCULAR | Status: AC | PRN
  Administered 2019-03-04: 100 mL via INTRAVENOUS

## 2019-03-04 MED ORDER — HEPARIN SOD (PORK) LOCK FLUSH 100 UNIT/ML IV SOLN
500.0000 [IU] | Freq: Once | INTRAVENOUS | Status: AC | PRN
  Administered 2019-03-04: 500 [IU]

## 2019-03-04 MED ORDER — SODIUM CHLORIDE 0.9 % IV SOLN
Freq: Once | INTRAVENOUS | Status: AC
  Administered 2019-03-04: 11:00:00 via INTRAVENOUS
  Filled 2019-03-04: qty 250

## 2019-03-04 NOTE — Telephone Encounter (Signed)
Called patient to inform her that referrals is looking for a home service that will service her location. They will contact [place] home health services and have them call patient to schedule.

## 2019-03-04 NOTE — Telephone Encounter (Signed)
Please apologize to patient. Thank you

## 2019-03-07 ENCOUNTER — Inpatient Hospital Stay (HOSPITAL_BASED_OUTPATIENT_CLINIC_OR_DEPARTMENT_OTHER): Payer: Medicare Other | Admitting: Oncology

## 2019-03-07 ENCOUNTER — Inpatient Hospital Stay: Payer: Medicare Other

## 2019-03-07 ENCOUNTER — Other Ambulatory Visit: Payer: Self-pay

## 2019-03-07 ENCOUNTER — Encounter: Payer: Self-pay | Admitting: Oncology

## 2019-03-07 VITALS — BP 139/77 | HR 68 | Temp 98.3°F | Resp 16 | Ht 59.0 in | Wt 218.6 lb

## 2019-03-07 DIAGNOSIS — C541 Malignant neoplasm of endometrium: Secondary | ICD-10-CM | POA: Diagnosis not present

## 2019-03-07 DIAGNOSIS — Z7189 Other specified counseling: Secondary | ICD-10-CM

## 2019-03-07 DIAGNOSIS — D759 Disease of blood and blood-forming organs, unspecified: Secondary | ICD-10-CM | POA: Diagnosis not present

## 2019-03-07 DIAGNOSIS — D701 Agranulocytosis secondary to cancer chemotherapy: Secondary | ICD-10-CM | POA: Diagnosis not present

## 2019-03-07 DIAGNOSIS — E860 Dehydration: Secondary | ICD-10-CM | POA: Diagnosis not present

## 2019-03-07 DIAGNOSIS — R531 Weakness: Secondary | ICD-10-CM | POA: Diagnosis not present

## 2019-03-07 DIAGNOSIS — T451X5A Adverse effect of antineoplastic and immunosuppressive drugs, initial encounter: Secondary | ICD-10-CM

## 2019-03-07 DIAGNOSIS — R002 Palpitations: Secondary | ICD-10-CM | POA: Diagnosis not present

## 2019-03-07 LAB — CBC WITH DIFFERENTIAL/PLATELET
Abs Immature Granulocytes: 0.01 10*3/uL (ref 0.00–0.07)
Basophils Absolute: 0 10*3/uL (ref 0.0–0.1)
Basophils Relative: 1 %
Eosinophils Absolute: 0 10*3/uL (ref 0.0–0.5)
Eosinophils Relative: 1 %
HCT: 29.3 % — ABNORMAL LOW (ref 36.0–46.0)
HEMOGLOBIN: 9.8 g/dL — AB (ref 12.0–15.0)
Immature Granulocytes: 1 %
LYMPHS PCT: 45 %
Lymphs Abs: 1 10*3/uL (ref 0.7–4.0)
MCH: 32 pg (ref 26.0–34.0)
MCHC: 33.4 g/dL (ref 30.0–36.0)
MCV: 95.8 fL (ref 80.0–100.0)
Monocytes Absolute: 0.2 10*3/uL (ref 0.1–1.0)
Monocytes Relative: 9 %
NEUTROS PCT: 43 %
Neutro Abs: 0.9 10*3/uL — ABNORMAL LOW (ref 1.7–7.7)
Platelets: 179 10*3/uL (ref 150–400)
RBC: 3.06 MIL/uL — ABNORMAL LOW (ref 3.87–5.11)
RDW: 14.3 % (ref 11.5–15.5)
WBC: 2.1 10*3/uL — ABNORMAL LOW (ref 4.0–10.5)
nRBC: 0 % (ref 0.0–0.2)

## 2019-03-07 LAB — COMPREHENSIVE METABOLIC PANEL
ALT: 16 U/L (ref 0–44)
ANION GAP: 8 (ref 5–15)
AST: 19 U/L (ref 15–41)
Albumin: 4 g/dL (ref 3.5–5.0)
Alkaline Phosphatase: 63 U/L (ref 38–126)
BUN: 11 mg/dL (ref 8–23)
CO2: 26 mmol/L (ref 22–32)
Calcium: 8.2 mg/dL — ABNORMAL LOW (ref 8.9–10.3)
Chloride: 104 mmol/L (ref 98–111)
Creatinine, Ser: 0.79 mg/dL (ref 0.44–1.00)
GFR calc Af Amer: >60 mL/min (ref >60)
GFR calc non Af Amer: >60 mL/min (ref >60)
Glucose, Bld: 101 mg/dL — ABNORMAL HIGH (ref 70–99)
Potassium: 3.6 mmol/L (ref 3.5–5.1)
Sodium: 138 mmol/L (ref 135–145)
Total Bilirubin: 0.6 mg/dL (ref 0.3–1.2)
Total Protein: 6.6 g/dL (ref 6.5–8.1)

## 2019-03-07 NOTE — Progress Notes (Signed)
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 Marcelline Mates for cystocele and rectocele. She was noted to have postmenopausal bleeding and cramping recently. Ultrasound revealed heterogeneous appearance of uterus with fibroids invading the endometrium. She underwent an endometrial biopsy when she had a second bout of postmenopausal bleeding. Biopsy showed endometrioid adenocarcinoma FIGO grade 1. She was then seen by Dr. Fransisca Connors and plan was to proceed with laparoscopic hysterectomy and bilateral salpingo-oophorectomy along with pelvic lymph node sampling  Final pathology showed: Endometrioid carcinoma FIGO grade 2 with 33% myometrial invasion. Lymphovascular invasion present. Peritoneal/ascites fluid atypical. 2 out of 3 sentinel lymph nodes were positive for macro metastases. pT1a pN1a. FIGO IIIC1  Patient's case was discussed at tumor board and consensus was to proceed with 3 cycles of adjuvant carbotaxol followed by radiation followed by 3 more cycles of chemotherapy. Patient is already met with Dr. Donella Stade who will be giving radiation therapy to pelvic and periaortic lymph nodes and boost to her vaginal cuff. PET CT scan showed metastatic adenopathy and retroperitoneal and external iliac group of lymph nodes in the right side. No evidence of metastatic disease elsewhere.  First cycle of chemotherapy was  given on 11/05/2018.  She completed 6 cycles on 02/25/2019   Interval history-she does report neuropathy in her hands and feet with pain and discomfort.  She is due to start acupuncture soon.  Feels fatigued.  Denies any fevers nausea vomiting cough or urinary symptoms.  ECOG PS- 1 Pain scale- 0 Opioid associated constipation- no  Review of systems- Review of Systems  Constitutional: Positive for malaise/fatigue. Negative for chills, fever and weight loss.  HENT: Negative for congestion, ear discharge and nosebleeds.   Eyes: Negative for blurred vision.  Respiratory: Negative for cough, hemoptysis, sputum production, shortness of breath and wheezing.   Cardiovascular: Negative for chest pain, palpitations, orthopnea and claudication.  Gastrointestinal: Negative for abdominal pain, blood in stool, constipation, diarrhea, heartburn, melena, nausea and vomiting.  Genitourinary: Negative for dysuria, flank pain, frequency, hematuria and urgency.  Musculoskeletal: Negative for back pain, joint pain and myalgias.  Skin: Negative for rash.  Neurological: Positive for sensory change (Peripheral neuropathy). Negative for dizziness, tingling, focal weakness, seizures, weakness and headaches.  Endo/Heme/Allergies: Does not bruise/bleed easily.  Psychiatric/Behavioral: Negative for depression and suicidal ideas. The patient does not have insomnia.       Allergies  Allergen Reactions  . Ferumoxytol Anaphylaxis  . Nsaids Hives and Rash  . Statins Other (See Comments)    Joint pains  . Victoza [Liraglutide] Other (See Comments)    pancreatitis  . Oxycodone Nausea And Vomiting  . Crestor [Rosuvastatin Calcium] Rash     Past Medical History:  Diagnosis Date  . Anxiety   . Asthma    history of asthma  . Cardiomyopathy ([place])   . Complication of anesthesia    tore hair out and made teeth rough  . Depression   . Endometrial cancer ([place]) 08/2018  . GERD (gastroesophageal reflux disease)  takes prilosec prn  . History of CVA (cerebrovascular accident) 12/10/2015  . Hyperlipidemia LDL goal <70 12/10/2015  . Hypertension   . Prediabetes 10/02/2017   A1c 6 in January 2018  . Stroke [facility]) 1990    no residual effects     Past Surgical History:  Procedure Laterality Date  . ABDOMINAL HYSTERECTOMY    . CHOLECYSTECTOMY    . COLONOSCOPY WITH PROPOFOL N/A 01/08/2017   Procedure: COLONOSCOPY WITH PROPOFOL;  Surgeon: [name], MD;  Location: [facility] ENDOSCOPY;  Service: Endoscopy;  Laterality: N/A;  . COLONOSCOPY WITH PROPOFOL N/A 05/05/2018   Procedure: COLONOSCOPY WITH PROPOFOL;  Surgeon: [name], MD;  Location: [facility] ENDOSCOPY;  Service: Gastroenterology;  Laterality: N/A;  . HERNIA REPAIR  [id number]   umbilical  . PORTACATH PLACEMENT N/A 11/04/2018   Procedure: INSERTION PORT-A-CATH;  Surgeon: Jules Husbands, MD;  Location: [facility] ORS;  Service: General;  Laterality: N/A;  . SENTINEL NODE BIOPSY N/A 10/06/2018   Procedure: SENTINEL NODE BIOPSY;  Surgeon: Mellody Drown, MD;  Location: [facility] ORS;  Service: Gynecology;  Laterality: N/A;  . UMBILICAL HERNIA REPAIR N/A 11/17/2017   Procedure: HERNIA REPAIR UMBILICAL ADULT;  Surgeon: Jules Husbands, MD;  Location: [facility] ORS;  Service: General;  Laterality: N/A;    Social History   Socioeconomic History  . Marital status: Divorced    Spouse name: Not on file  . Number of children: 2  . Years of education: some college  . Highest education level: 12th grade  Occupational History  . 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 Occupation: currently a Careers adviser  . Financial resource strain: Not hard at all  . Food insecurity:    Worry: Never true    Inability: Never true  . Transportation needs:    Medical: No    Non-medical: No  Tobacco Use  . Smoking status: Never Smoker  . Smokeless tobacco: Never Used  . Tobacco comment: smoking cessation materials not required  Substance and Sexual Activity   . Alcohol use: No    Alcohol/week: 0.0 standard drinks  . Drug use: No  . Sexual activity: Not Currently  Lifestyle  . Physical activity:    Days per week: 0 days    Minutes per session: 0 min  . Stress: Not at all  Relationships  . Social connections:    Talks on phone: More than three times a week    Gets together: Three times a week    Attends religious service: More than 4 times per year    Active member of club or organization: Yes    Attends meetings of clubs or organizations: More than 4 times per year    Relationship status: Divorced  . Intimate partner violence:    Fear of current or ex partner: No    Emotionally abused: No    Physically abused: No    Forced sexual activity: No  Other Topics Concern  . Not on file  Social History Narrative  . Not on file    Family History  Problem Relation Age of Onset  . Stroke Mother   . Hypertension Mother   . Dementia Mother   . Alzheimer's disease Mother   . Gout Father   . Asthma Father   . Hypertension Father   . Dementia Father   . Healthy Sister   . Stroke Brother   . Alzheimer's disease Brother   . Healthy Daughter   .  Hypertension Brother   . Healthy Brother   . Cancer Paternal Grandmother   . Healthy Sister   . Healthy Sister   . Hypertension Sister   . Hypertension Sister   . Stroke Brother   . Alzheimer's disease Brother   . Stroke Brother   . Hypertension Brother   . Stroke Brother   . Hypertension Brother   . Healthy Brother   . Healthy Brother   . Hypertension Daughter   . Breast cancer Neg Hx      Current Outpatient Medications:  .  acetaminophen (TYLENOL) 500 MG tablet, Take 2 tablets (1,000 mg total) by mouth every 6 (six) hours as needed for mild pain or moderate pain., Disp: 30 tablet, Rfl: 0 .  albuterol (PROVENTIL HFA;VENTOLIN HFA) 108 (90 Base) MCG/ACT inhaler, Inhale 2 puffs into the lungs every 4 (four) hours as needed for wheezing or shortness of breath., Disp: 1 Inhaler, Rfl: 0 .   dexamethasone (DECADRON) 4 MG tablet, Take 2 tablets (8 mg total) by mouth daily. Start the day after chemotherapy for 2 days., Disp: 30 tablet, Rfl: 1 .  diclofenac sodium (VOLTAREN) 1 % GEL, Apply 2 g topically 3 (three) times daily as needed (pain)., Disp: 100 g, Rfl: 1 .  escitalopram (LEXAPRO) 10 MG tablet, Take 1 tablet (10 mg total) by mouth daily., Disp: 90 tablet, Rfl: 0 .  ezetimibe (ZETIA) 10 MG tablet, Take 1 tablet (10 mg total) by mouth daily., Disp: 90 tablet, Rfl: 0 .  fluticasone furoate-vilanterol (BREO ELLIPTA) 100-25 MCG/INH AEPB, Inhale 1 puff into the lungs daily., Disp: 60 each, Rfl: 0 .  furosemide (LASIX) 40 MG tablet, Take 1 tablet (40 mg total) by mouth daily as needed for fluid., Disp: 90 tablet, Rfl: 0 .  ketoconazole (NIZORAL) 2 % cream, Apply 1 application topically daily as needed for irritation. On abdominal fold prn, Disp: 60 g, Rfl: 0 .  lidocaine-prilocaine (EMLA) cream, Apply to affected area once, Disp: 30 g, Rfl: 3 .  loratadine (CLARITIN) 10 MG tablet, Take 1 tablet by mouth daily., Disp: 30 tablet, Rfl: 5 .  LORazepam (ATIVAN) 0.5 MG tablet, Take 1 tablet (0.5 mg total) by mouth every 6 (six) hours as needed (Nausea or vomiting)., Disp: 30 tablet, Rfl: 0 .  magnesium oxide (MAG-OX) 400 (241.3 Mg) MG tablet, Take 1 tablet (400 mg total) by mouth daily. For hypomagnesemia, Disp: 30 tablet, Rfl: 0 .  morphine (MSIR) 15 MG tablet, Take 1 tablet (15 mg total) by mouth every 6 (six) hours as needed for severe pain., Disp: 20 tablet, Rfl: 0 .  olmesartan-hydrochlorothiazide (BENICAR HCT) 20-12.5 MG tablet, Take 1 tablet by mouth daily., Disp: 90 tablet, Rfl: 0 .  omeprazole (PRILOSEC OTC) 20 MG tablet, Take 20 mg by mouth daily as needed., Disp: , Rfl:  .  ondansetron (ZOFRAN) 8 MG tablet, Take 1 tablet (8 mg total) by mouth 2 (two) times daily as needed for refractory nausea / vomiting. Start on day 3 after chemo., Disp: 30 tablet, Rfl: 1 .  potassium chloride SA  (K-DUR,KLOR-CON) 20 MEQ tablet, Take 1 tablet (20 mEq total) by mouth daily., Disp: 30 tablet, Rfl: 0 .  pregabalin (LYRICA) 50 MG capsule, Take 1 capsule (50 mg total) by mouth 2 (two) times daily., Disp: 60 capsule, Rfl: 1 .  prochlorperazine (COMPAZINE) 10 MG tablet, Take 10 mg by mouth every 6 (six) hours as needed for nausea or vomiting., Disp: , Rfl:  .  traMADol (ULTRAM) 50  MG tablet, Take 1 tablet (50 mg total) by mouth every 6 (six) hours as needed., Disp: 15 tablet, Rfl: 0 .  triamcinolone ointment (KENALOG) 0.5 %, Apply 1 application topically 2 (two) times daily., Disp: 30 g, Rfl: 0 No current facility-administered medications for this visit.   Facility-Administered Medications Ordered in Other Visits:  .  sodium chloride flush (NS) 0.9 % injection 10 mL, 10 mL, Intravenous, PRN, Sindy Guadeloupe, MD, 10 mL at 11/15/18 0915 .  sodium chloride flush (NS) 0.9 % injection 10 mL, 10 mL, Intracatheter, Once PRN, Sindy Guadeloupe, MD  Physical exam:  Vitals:   03/07/19 1118 03/07/19 1125  BP:  139/77  Pulse:  68  Resp: 16   Temp:  98.3 F (36.8 C)  TempSrc:  Tympanic  Weight: 218 lb 9.6 oz (99.2 kg)   Height: 4' 11"  (1.499 m)    Physical Exam Constitutional:      General: She is not in acute distress.    Appearance: She is obese.  HENT:     Head: Normocephalic and atraumatic.  Eyes:     Pupils: Pupils are equal, round, and reactive to light.  Neck:     Musculoskeletal: Normal range of motion.  Cardiovascular:     Rate and Rhythm: Normal rate and regular rhythm.     Heart sounds: Normal heart sounds.  Pulmonary:     Effort: Pulmonary effort is normal.     Breath sounds: Normal breath sounds.  Abdominal:     General: Bowel sounds are normal.     Palpations: Abdomen is soft.  Skin:    General: Skin is warm and dry.  Neurological:     Mental Status: She is alert and oriented to person, place, and time.      CMP Latest Ref Rng & Units 03/07/2019  Glucose 70 - 99 mg/dL  101(H)  BUN 8 - 23 mg/dL 11  Creatinine 0.44 - 1.00 mg/dL 0.79  Sodium 135 - 145 mmol/L 138  Potassium 3.5 - 5.1 mmol/L 3.6  Chloride 98 - 111 mmol/L 104  CO2 22 - 32 mmol/L 26  Calcium 8.9 - 10.3 mg/dL 8.2(L)  Total Protein 6.5 - 8.1 g/dL 6.6  Total Bilirubin 0.3 - 1.2 mg/dL 0.6  Alkaline Phos 38 - 126 U/L 63  AST 15 - 41 U/L 19  ALT 0 - 44 U/L 16   CBC Latest Ref Rng & Units 03/07/2019  WBC 4.0 - 10.5 K/uL 2.1(L)  Hemoglobin 12.0 - 15.0 g/dL 9.8(L)  Hematocrit 36.0 - 46.0 % 29.3(L)  Platelets 150 - 400 K/uL 179    No images are attached to the encounter.  Ct Chest W Contrast  Result Date: 03/04/2019 CLINICAL DATA:  Patient with history of endometrial carcinoma. Follow-up exam. EXAM: CT CHEST, ABDOMEN, AND PELVIS WITH CONTRAST TECHNIQUE: Multidetector CT imaging of the chest, abdomen and pelvis was performed following the standard protocol during bolus administration of intravenous contrast. CONTRAST:  139m OMNIPAQUE IOHEXOL 300 MG/ML  SOLN COMPARISON:  PET-CT 10/29/2018; chest CT 10/22/2018. FINDINGS: CT CHEST FINDINGS Cardiovascular: Normal heart size. No pericardial effusion. Aorta and main pulmonary artery normal in caliber. Mediastinum/Nodes: No enlarged axillary, mediastinal or hilar lymphadenopathy. Normal appearance of the esophagus. Lungs/Pleura: Central airways are patent. No large area pulmonary consolidation. No pleural effusion or pneumothorax. Similar-appearing 2 mm left upper lobe nodule (image 69; series 4). Musculoskeletal: Thoracic spine degenerative changes. No aggressive or acute appearing osseous lesions. CT ABDOMEN PELVIS FINDINGS Hepatobiliary: Liver is normal  in size and contour. No focal lesion identified. Prior cholecystectomy. No intrahepatic or extrahepatic biliary ductal dilatation. Pancreas: Unremarkable Spleen: Unremarkable Adrenals/Urinary Tract: Normal adrenal glands. Kidneys enhance symmetrically with contrast. No hydronephrosis. Urinary bladder is  unremarkable. Stomach/Bowel: No abnormal bowel wall thickening or evidence for bowel obstruction. Normal morphology of the stomach. No free fluid or free intraperitoneal air. Vascular/Lymphatic: Normal caliber abdominal aorta. Interval decrease in size of left periaortic lymph node measuring 0.9 cm (image 76; series 8), previously 1.7 cm. Interval decrease in size of right external iliac lymph node measuring 0.6 cm (image 96; series 8), previously 1.0 cm. Interval decrease in size of 1.0 cm left external iliac lymph node (image 92; series 8), previously 1.2 cm. Reproductive: Prior hysterectomy. Similar-appearing 2.2 x 2.3 cm right adnexal fluid collection (image 81; series 8). Similar 2.8 x 2.2 cm left adnexal simple fluid collection (image 85; series 8). Other: Similar-appearing stranding within the omentum (image 81; series 8). No discrete peritoneal nodularity identified. Musculoskeletal: Lumbar spine degenerative changes. No aggressive or acute appearing osseous lesions. IMPRESSION: 1. Interval decrease in size of left periaortic and external iliac adenopathy. 2. Unchanged 2 mm pulmonary nodule. Recommend attention on follow-up. 3. Unchanged stranding within the omentum without discrete nodularity. Recommend attention on follow-up. Electronically Signed   By: Lovey Newcomer M.D.   On: 03/04/2019 11:21   Ct Abdomen Pelvis W Contrast  Result Date: 03/04/2019 CLINICAL DATA:  Patient with history of endometrial carcinoma. Follow-up exam. EXAM: CT CHEST, ABDOMEN, AND PELVIS WITH CONTRAST TECHNIQUE: Multidetector CT imaging of the chest, abdomen and pelvis was performed following the standard protocol during bolus administration of intravenous contrast. CONTRAST:  147m OMNIPAQUE IOHEXOL 300 MG/ML  SOLN COMPARISON:  PET-CT 10/29/2018; chest CT 10/22/2018. FINDINGS: CT CHEST FINDINGS Cardiovascular: Normal heart size. No pericardial effusion. Aorta and main pulmonary artery normal in caliber. Mediastinum/Nodes: No  enlarged axillary, mediastinal or hilar lymphadenopathy. Normal appearance of the esophagus. Lungs/Pleura: Central airways are patent. No large area pulmonary consolidation. No pleural effusion or pneumothorax. Similar-appearing 2 mm left upper lobe nodule (image 69; series 4). Musculoskeletal: Thoracic spine degenerative changes. No aggressive or acute appearing osseous lesions. CT ABDOMEN PELVIS FINDINGS Hepatobiliary: Liver is normal in size and contour. No focal lesion identified. Prior cholecystectomy. No intrahepatic or extrahepatic biliary ductal dilatation. Pancreas: Unremarkable Spleen: Unremarkable Adrenals/Urinary Tract: Normal adrenal glands. Kidneys enhance symmetrically with contrast. No hydronephrosis. Urinary bladder is unremarkable. Stomach/Bowel: No abnormal bowel wall thickening or evidence for bowel obstruction. Normal morphology of the stomach. No free fluid or free intraperitoneal air. Vascular/Lymphatic: Normal caliber abdominal aorta. Interval decrease in size of left periaortic lymph node measuring 0.9 cm (image 76; series 8), previously 1.7 cm. Interval decrease in size of right external iliac lymph node measuring 0.6 cm (image 96; series 8), previously 1.0 cm. Interval decrease in size of 1.0 cm left external iliac lymph node (image 92; series 8), previously 1.2 cm. Reproductive: Prior hysterectomy. Similar-appearing 2.2 x 2.3 cm right adnexal fluid collection (image 81; series 8). Similar 2.8 x 2.2 cm left adnexal simple fluid collection (image 85; series 8). Other: Similar-appearing stranding within the omentum (image 81; series 8). No discrete peritoneal nodularity identified. Musculoskeletal: Lumbar spine degenerative changes. No aggressive or acute appearing osseous lesions. IMPRESSION: 1. Interval decrease in size of left periaortic and external iliac adenopathy. 2. Unchanged 2 mm pulmonary nodule. Recommend attention on follow-up. 3. Unchanged stranding within the omentum without  discrete nodularity. Recommend attention on follow-up. Electronically Signed   By:  Lovey Newcomer M.D.   On: 03/04/2019 11:21     Assessment and plan- Patient is a 68 y.o. female   invasive adenocarcinoma of the endometrium endometrioid subtypeFIGO Stage IIIC2 pT1a N2 M0.  She is s/p 6 cycles of carbotaxol chemotherapy and here to discuss the results of her CT scans  Patient has completed 6 cycles of carbotaxol chemotherapy on 02/25/2019.  I have reviewed CT chest abdomen and pelvis images independently and discussed findings with the patient.  Overall there is a reduction in size of left periaortic and external iliac lymph nodes.  She does have a 2 mm left upper lobe lung nodule which is essentially stable.  She can proceed with adjuvant radiation treatment at this time and Dr. Purvis Kilts will be in touch with Dr. Baruch Gouty regarding this.  I will tentatively see her back in 6 weeks time with a CBC with differential and CMP.  She does have chemo-induced neutropenia today which would be expected 10 days post chemotherapy and is likely to recover over the next week to 10 days.  I will plan to get repeat CT chest abdomen and pelvis in 6 months time as well to ensure stability of periaortic and external iliac lymph nodes as well as to monitor her left upper lobe lung nodule.  Chemo-induced peripheral neuropathy: We will increase the dose of her Lyrica to 75 mg twice daily.  She will also be starting acupuncture soon.  I will plan to reassess the symptoms in 6 weeks time  Patient will continue to get her port flushed every 6 weeks.  If scans show stable findings at the end of 72-monthwe could consider taking her port out at that time.   Visit Diagnosis 1. Endometrial adenocarcinoma ([facility] [place]   2. Goals of care, counseling/discussion   3. Chemotherapy induced neutropenia (HCC)      Dr. ARanda Evens MD, MPH [id number]/08/2019 3:20 PM

## 2019-03-07 NOTE — Progress Notes (Signed)
Patient feeling better but was very tired and decreased appetite  And generalized weakness.

## 2019-03-08 ENCOUNTER — Telehealth: Payer: Self-pay | Admitting: *Deleted

## 2019-03-08 ENCOUNTER — Other Ambulatory Visit: Payer: Self-pay | Admitting: *Deleted

## 2019-03-08 ENCOUNTER — Other Ambulatory Visit: Payer: Self-pay | Admitting: Family Medicine

## 2019-03-08 ENCOUNTER — Telehealth: Payer: Self-pay

## 2019-03-08 DIAGNOSIS — I10 Essential (primary) hypertension: Secondary | ICD-10-CM

## 2019-03-08 DIAGNOSIS — T451X5A Adverse effect of antineoplastic and immunosuppressive drugs, initial encounter: Secondary | ICD-10-CM

## 2019-03-08 DIAGNOSIS — G620 Drug-induced polyneuropathy: Secondary | ICD-10-CM

## 2019-03-08 DIAGNOSIS — I119 Hypertensive heart disease without heart failure: Secondary | ICD-10-CM

## 2019-03-08 DIAGNOSIS — R600 Localized edema: Secondary | ICD-10-CM

## 2019-03-08 DIAGNOSIS — I43 Cardiomyopathy in diseases classified elsewhere: Secondary | ICD-10-CM

## 2019-03-08 MED ORDER — PREGABALIN 50 MG PO CAPS: 50.0000 mg | ORAL_CAPSULE | Freq: Two times a day (BID) | ORAL | 3 refills | Status: DC

## 2019-03-08 NOTE — Telephone Encounter (Signed)
Please changer to nursing, whatever they need. She is frail and needs assistance

## 2019-03-08 NOTE — Telephone Encounter (Signed)
Copied from [place] [id number]. Topic: Quick Communication - See Telephone Encounter >> Mar 07, 2019 10:30 AM Rayann Heman wrote: Amy calling with [facility] needs clarification on if the pt needs a nurse or a CMA. Amy states that they do not have CMA services. Please advise  Mrs. Chojnowski is still without home health services. I have tired to reach out to Amy in regards to her message about nurse or CMA needed. I left a voice message confirming that it would be a Nurse not a CMA. I have not heard anything back from her. I spoke with Mrs Boullion today and she is still unsure about the status of home health aide.  Could we please find someone that can assist her immediately.

## 2019-03-08 NOTE — Telephone Encounter (Signed)
Called patient to inform her that we have made her an appointment with Dr. Donella Stade on Wednesday, March 18 @ 10 am.  Informed patient she will not start radiation treatments that day.  This appointment is to see the doctor only.  Patient verbalized understanding.

## 2019-03-09 NOTE — Telephone Encounter (Signed)
I have tried several time to call and let her know that a Nurse was needed. LVM one day. Next day mailbox was full unable to leave a message.

## 2019-03-09 NOTE — Telephone Encounter (Signed)
Spoke to Amy, advised to send a RN to see what kind of services she would qualify for. Spoke to supervisor and she does not qualify for the services requested. I will notify patient.

## 2019-03-15 ENCOUNTER — Other Ambulatory Visit: Payer: Self-pay

## 2019-03-16 ENCOUNTER — Encounter: Payer: Self-pay | Admitting: Radiation Oncology

## 2019-03-16 ENCOUNTER — Ambulatory Visit
Admission: RE | Admit: 2019-03-16 | Discharge: 2019-03-16 | Disposition: A | Discharge status: Home / Self Care | Payer: Medicare Other | Source: Ambulatory Visit | Attending: Radiation Oncology | Admitting: Radiation Oncology

## 2019-03-16 ENCOUNTER — Other Ambulatory Visit: Payer: Self-pay

## 2019-03-16 VITALS — BP 144/82 | HR 70 | Temp 96.2°F | Resp 16 | Wt 218.0 lb

## 2019-03-16 DIAGNOSIS — C541 Malignant neoplasm of endometrium: Secondary | ICD-10-CM

## 2019-03-16 NOTE — Progress Notes (Signed)
Radiation Oncology Follow up Note  Name: SHAQUAN PUERTA   Date:   03/16/2019 MRN:  [id number] DOB: [birth year]    This 68 y.o. female presents to the clinic today for evaluation to start both pelvic and para-aortic radiation therapy for stage IIIc endometrial carcinoma  REFERRING PROVIDER: Steele Sizer, MD  HPI: patient is a 68 year old female originally consult back in October 2019 status post TAH/BSO for stage IIIc endometrial adenocarcinoma. She had metastatic adenopathy and periaortic and retroperitoneal lymph nodes and what as well as right external iliac chain. She is undergone ompleted 6 cycles of carbotaxol with overall reduction in size of left para-aortic and external iliac lymph nodes. She does have a 2 mm left upper lobe lung nodule which is unchanged. She is seen today to begin external beam radiation therapy as part of her treatment plan. She is doing well she had a pelvic exam about a week and a half ago which was within normal limits. She specifically denies diarrhea any increased lower urinary check symptoms or vaginal discharge..  COMPLICATIONS OF TREATMENT: none  FOLLOW UP COMPLIANCE: keeps appointments   PHYSICAL EXAM:  BP (!) 144/82 (BP Location: Left Arm, Patient Position: Sitting)   Pulse 70   Temp (!) 96.2 F (35.7 C) (Tympanic)   Resp 16   Wt 218 lb 0.6 oz (98.9 kg)   BMI 44.04 kg/m  Well-developed well-nourished patient in NAD. HEENT reveals PERLA, EOMI, discs not visualized.  Oral cavity is clear. No oral mucosal lesions are identified. Neck is clear without evidence of cervical or supraclavicular adenopathy. Lungs are clear to A&P. Cardiac examination is essentially unremarkable with regular rate and rhythm without murmur rub or thrill. Abdomen is benign with no organomegaly or masses noted. Motor sensory and DTR levels are equal and symmetric in the upper and lower extremities. Cranial nerves II through XII are grossly intact. Proprioception is intact. No  peripheral adenopathy or edema is identified. No motor or sensory levels are noted. Crude visual fields are within normal range.  RADIOLOGY RESULTS: PET CT scan is reviewed compatible above-stated findings  PLAN: we have discussed treating both para-aortic and pelvic lymph nodes at the same time which in this obese patient given her age would be extremely difficult course of treatment after systemic chemotherapy. I would like to break it up and treat her perioral lymph nodes. I would choose I M RT radiation therapy and do dose painting bringing her pelvic lymph nodes which were initially PET positive up to 5500 cGy and treat the rest of the pelvic lymph nodes to 4500 cGy. I would do the same thing again with periaortic lymph nodes after completion of 5 week course of pelvic radiation. Risks and benefits of treatment including increased lower urinary tract symptoms diarrhea fatigue alteration of blood counts skin reaction all were discussed in detail with the patient and her daughter. I have personally set up and ordered CT simulation for next week. Patient compresses my treatment plan well.  I would like to take this opportunity to thank you for allowing me to participate in the care of your patient.Noreene Filbert, MD

## 2019-03-20 ENCOUNTER — Other Ambulatory Visit: Payer: Self-pay | Admitting: Oncology

## 2019-03-21 ENCOUNTER — Other Ambulatory Visit: Payer: Self-pay

## 2019-03-21 ENCOUNTER — Other Ambulatory Visit: Payer: Self-pay | Admitting: Oncology

## 2019-03-21 NOTE — Telephone Encounter (Signed)
   Ref Range & Units 2wk ago  Potassium 3.5 - 5.1 mmol/L 3.6

## 2019-03-22 ENCOUNTER — Ambulatory Visit
Admission: RE | Admit: 2019-03-22 | Discharge: 2019-03-22 | Disposition: A | Discharge status: Home / Self Care | Payer: Medicare Other | Source: Ambulatory Visit | Attending: Radiation Oncology | Admitting: Radiation Oncology

## 2019-03-22 ENCOUNTER — Other Ambulatory Visit: Payer: Self-pay

## 2019-03-22 DIAGNOSIS — C541 Malignant neoplasm of endometrium: Secondary | ICD-10-CM | POA: Diagnosis not present

## 2019-03-22 DIAGNOSIS — Z510 Encounter for antineoplastic radiation therapy: Secondary | ICD-10-CM | POA: Diagnosis not present

## 2019-03-24 ENCOUNTER — Other Ambulatory Visit: Payer: Self-pay | Admitting: *Deleted

## 2019-03-24 DIAGNOSIS — C541 Malignant neoplasm of endometrium: Secondary | ICD-10-CM

## 2019-03-24 DIAGNOSIS — Z510 Encounter for antineoplastic radiation therapy: Secondary | ICD-10-CM | POA: Diagnosis not present

## 2019-03-30 ENCOUNTER — Other Ambulatory Visit: Payer: Self-pay

## 2019-03-31 ENCOUNTER — Other Ambulatory Visit: Payer: Self-pay

## 2019-03-31 ENCOUNTER — Ambulatory Visit
Admission: RE | Admit: 2019-03-31 | Discharge: 2019-03-31 | Disposition: A | Discharge status: Home / Self Care | Payer: Medicare Other | Source: Ambulatory Visit | Attending: Radiation Oncology | Admitting: Radiation Oncology

## 2019-03-31 DIAGNOSIS — Z510 Encounter for antineoplastic radiation therapy: Secondary | ICD-10-CM | POA: Insufficient documentation

## 2019-03-31 DIAGNOSIS — C541 Malignant neoplasm of endometrium: Secondary | ICD-10-CM | POA: Insufficient documentation

## 2019-04-01 ENCOUNTER — Other Ambulatory Visit: Payer: Medicare Other

## 2019-04-01 ENCOUNTER — Ambulatory Visit: Payer: Medicare Other

## 2019-04-01 ENCOUNTER — Ambulatory Visit: Payer: Medicare Other | Admitting: Oncology

## 2019-04-04 ENCOUNTER — Ambulatory Visit
Admission: RE | Admit: 2019-04-04 | Discharge: 2019-04-04 | Disposition: A | Discharge status: Home / Self Care | Payer: Medicare Other | Source: Ambulatory Visit | Attending: Radiation Oncology | Admitting: Radiation Oncology

## 2019-04-04 ENCOUNTER — Other Ambulatory Visit: Payer: Self-pay

## 2019-04-04 DIAGNOSIS — C541 Malignant neoplasm of endometrium: Secondary | ICD-10-CM | POA: Diagnosis not present

## 2019-04-04 DIAGNOSIS — Z510 Encounter for antineoplastic radiation therapy: Secondary | ICD-10-CM | POA: Diagnosis not present

## 2019-04-05 ENCOUNTER — Ambulatory Visit
Admission: RE | Admit: 2019-04-05 | Discharge: 2019-04-05 | Disposition: A | Discharge status: Home / Self Care | Payer: Medicare Other | Source: Ambulatory Visit | Attending: Radiation Oncology | Admitting: Radiation Oncology

## 2019-04-05 ENCOUNTER — Other Ambulatory Visit: Payer: Self-pay

## 2019-04-05 DIAGNOSIS — Z510 Encounter for antineoplastic radiation therapy: Secondary | ICD-10-CM | POA: Diagnosis not present

## 2019-04-06 ENCOUNTER — Other Ambulatory Visit: Payer: Self-pay

## 2019-04-06 ENCOUNTER — Ambulatory Visit
Admission: RE | Admit: 2019-04-06 | Discharge: 2019-04-06 | Disposition: A | Discharge status: Home / Self Care | Payer: Medicare Other | Source: Ambulatory Visit | Attending: Radiation Oncology | Admitting: Radiation Oncology

## 2019-04-06 DIAGNOSIS — Z510 Encounter for antineoplastic radiation therapy: Secondary | ICD-10-CM | POA: Diagnosis not present

## 2019-04-07 ENCOUNTER — Ambulatory Visit
Admission: RE | Admit: 2019-04-07 | Discharge: 2019-04-07 | Disposition: A | Discharge status: Home / Self Care | Payer: Medicare Other | Source: Ambulatory Visit | Attending: Radiation Oncology | Admitting: Radiation Oncology

## 2019-04-07 ENCOUNTER — Other Ambulatory Visit: Payer: Self-pay | Admitting: Family Medicine

## 2019-04-07 ENCOUNTER — Other Ambulatory Visit: Payer: Self-pay

## 2019-04-07 ENCOUNTER — Ambulatory Visit: Payer: Medicare Other

## 2019-04-07 VITALS — BP 135/88 | HR 79 | Ht 59.0 in | Wt 220.0 lb

## 2019-04-07 DIAGNOSIS — G620 Drug-induced polyneuropathy: Secondary | ICD-10-CM

## 2019-04-07 DIAGNOSIS — Z1231 Encounter for screening mammogram for malignant neoplasm of breast: Secondary | ICD-10-CM

## 2019-04-07 DIAGNOSIS — Z510 Encounter for antineoplastic radiation therapy: Secondary | ICD-10-CM | POA: Diagnosis not present

## 2019-04-07 DIAGNOSIS — Z780 Asymptomatic menopausal state: Secondary | ICD-10-CM

## 2019-04-07 DIAGNOSIS — T451X5A Adverse effect of antineoplastic and immunosuppressive drugs, initial encounter: Secondary | ICD-10-CM

## 2019-04-07 DIAGNOSIS — Z0000 Encounter for general adult medical examination without abnormal findings: Secondary | ICD-10-CM

## 2019-04-07 DIAGNOSIS — F330 Major depressive disorder, recurrent, mild: Secondary | ICD-10-CM

## 2019-04-07 NOTE — Progress Notes (Signed)
Subjective:   Molly [name] is a 68 y.o. female who presents for Medicare Annual (Subsequent) preventive examination.  This visit is being conducted via virtual visit due to the COVID-19 pandemic. This patient has given me verbal consent via video chat to conduct this visit. Some vital signs may be absent or patient reported.    Review of Systems:   Cardiac Risk Factors include: advanced age (>69men, >81 women);hypertension;obesity (BMI >30kg/m2);dyslipidemia     Objective:     Vitals: BP 135/88   Pulse 79   Ht 4\' 11"  (1.499 m)   Wt 220 lb (99.8 kg)   BMI 44.43 kg/m   Body mass index is 44.43 kg/m.  Advanced Directives 04/07/2019 03/16/2019 03/07/2019 02/25/2019 02/04/2019 02/02/2019 01/25/2019  Does Patient Have a Medical Advance Directive? No No No No Yes No No  Would patient like information on creating a medical advance directive? Yes (MAU/Ambulatory/Procedural Areas - Information given) No - Patient declined No - Patient declined No - Patient declined Yes (MAU/Ambulatory/Procedural Areas - Information given) No - Patient declined No - Patient declined    Tobacco Social History   Tobacco Use  Smoking Status Never Smoker  Smokeless Tobacco Never Used  Tobacco Comment   smoking cessation materials not required     Counseling given: Not Answered Comment: smoking cessation materials not required   Clinical Intake:  Pre-visit preparation completed: Yes  Pain : No/denies pain     BMI - recorded: 44.43 Nutritional Status: BMI > 30  Obese Nutritional Risks: Nausea/ vomitting/ diarrhea(diarrhea from radiation therapy) Diabetes: No  How often do you need to have someone help you when you read instructions, pamphlets, or other written materials from your doctor or pharmacy?: 1 - Never What is the last grade level you completed in school?: 12th grade  Interpreter Needed?: No  Information entered by :: Clemetine Marker LPN  Past Medical History:  Diagnosis Date  . Anxiety    . Asthma    history of asthma  . Cardiomyopathy ([place])   . Complication of anesthesia    tore hair out and made teeth rough  . Depression   . Endometrial cancer ([place]) 08/2018  . GERD (gastroesophageal reflux disease)    takes prilosec prn  . History of CVA (cerebrovascular accident) 12/10/2015  . Hyperlipidemia LDL goal <70 12/10/2015  . Hypertension   . Prediabetes 10/02/2017   A1c 6 in January 2018  . Stroke [facility]) 1990    no residual effects   Past Surgical History:  Procedure Laterality Date  . ABDOMINAL HYSTERECTOMY    . CHOLECYSTECTOMY    . COLONOSCOPY WITH PROPOFOL N/A 01/08/2017   Procedure: COLONOSCOPY WITH PROPOFOL;  Surgeon: [name], MD;  Location: [facility] ENDOSCOPY;  Service: Endoscopy;  Laterality: N/A;  . COLONOSCOPY WITH PROPOFOL N/A 05/05/2018   Procedure: COLONOSCOPY WITH PROPOFOL;  Surgeon: [name], MD;  Location: [facility] ENDOSCOPY;  Service: Gastroenterology;  Laterality: N/A;  . HERNIA REPAIR  [id number]   umbilical  . PORTACATH PLACEMENT N/A 11/04/2018   Procedure: INSERTION PORT-A-CATH;  Surgeon: Jules Husbands, MD;  Location: [facility] ORS;  Service: General;  Laterality: N/A;  . SENTINEL NODE BIOPSY N/A 10/06/2018   Procedure: SENTINEL NODE BIOPSY;  Surgeon: Mellody Drown, MD;  Location: [facility] ORS;  Service: Gynecology;  Laterality: N/A;  . UMBILICAL HERNIA REPAIR N/A 11/17/2017   Procedure: HERNIA REPAIR UMBILICAL ADULT;  Surgeon: Jules Husbands, MD;  Location: [facility] ORS;  Service: General;  Laterality: N/A;   Family History  Problem Relation Age of Onset  . Stroke Mother   . Hypertension Mother   . Dementia Mother   . Alzheimer's disease Mother   . Gout Father   . Asthma Father   . Hypertension Father   . Dementia Father   . Healthy Sister   . Stroke Brother   . Alzheimer's disease Brother   . Healthy Daughter   . Hypertension Brother   . Healthy Brother   . Cancer Paternal Grandmother   . Healthy Sister   . Healthy Sister   . Hypertension Sister   .  Hypertension Sister   . Stroke Brother   . Alzheimer's disease Brother   . Stroke Brother   . Hypertension Brother   . Stroke Brother   . Hypertension Brother   . Healthy Brother   . Healthy Brother   . Hypertension Daughter   . Breast cancer Neg Hx    Social History   Socioeconomic History  . Marital status: Divorced    Spouse name: Not on file  . Number of children: 2  . Years of education: some college  . Highest education level: 12th grade  Occupational History  . 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 Occupation: currently a Careers adviser  . Financial resource strain: Not hard at all  . Food insecurity:    Worry: Never true    Inability: Never true  . Transportation needs:    Medical: No    Non-medical: No  Tobacco Use  . Smoking status: Never Smoker  . Smokeless tobacco: Never Used  . Tobacco comment: smoking cessation materials not required  Substance and Sexual Activity  . Alcohol use: No    Alcohol/week: 0.0 standard drinks  . Drug use: No  . Sexual activity: Not Currently  Lifestyle  . Physical activity:    Days per week: 1 day    Minutes per session: 20 min  . Stress: Not at all  Relationships  . Social connections:    Talks on phone: More than three times a week    Gets together: Three times a week    Attends religious service: More than 4 times per year    Active member of club or organization: Yes    Attends meetings of clubs or organizations: More than 4 times per year    Relationship status: Divorced  Other Topics Concern  . Not on file  Social History Narrative  . Not on file    Outpatient Encounter Medications as of 04/07/2019  Medication Sig  . acetaminophen (TYLENOL) 500 MG tablet Take 2 tablets (1,000 mg total) by mouth every 6 (six) hours as needed for mild pain or moderate pain.  Marland Kitchen albuterol (PROVENTIL HFA;VENTOLIN HFA) 108 (90 Base) MCG/ACT inhaler Inhale 2 puffs into the lungs every 4 (four) hours as  needed for wheezing or shortness of breath.  . diclofenac sodium (VOLTAREN) 1 % GEL Apply 2 g topically 3 (three) times daily as needed (pain).  Marland Kitchen escitalopram (LEXAPRO) 10 MG tablet Take 1 tablet (10 mg total) by mouth daily.  Marland Kitchen ezetimibe (ZETIA) 10 MG tablet Take 1 tablet (10 mg total) by mouth daily.  . fluticasone furoate-vilanterol (BREO ELLIPTA) 100-25 MCG/INH AEPB Inhale 1 puff into the lungs daily.  . furosemide (LASIX) 40 MG tablet Take 1 tablet (40 mg total) by mouth daily as needed for fluid.  Marland Kitchen ketoconazole (NIZORAL) 2 % cream Apply 1 application topically daily  as needed for irritation. On abdominal fold prn  . lidocaine-prilocaine (EMLA) cream Apply to affected area once  . loratadine (CLARITIN) 10 MG tablet Take 1 tablet by mouth daily.  Marland Kitchen LORazepam (ATIVAN) 0.5 MG tablet Take 1 tablet (0.5 mg total) by mouth every 6 (six) hours as needed (Nausea or vomiting).  . magnesium oxide (MAG-OX) 400 (241.3 Mg) MG tablet Take 1 tablet (400 mg total) by mouth daily. For hypomagnesemia  . olmesartan-hydrochlorothiazide (BENICAR HCT) 20-12.5 MG tablet Take 1 tablet by mouth daily.  Marland Kitchen omeprazole (PRILOSEC OTC) 20 MG tablet Take 20 mg by mouth daily as needed.  . ondansetron (ZOFRAN) 8 MG tablet Take 1 tablet (8 mg total) by mouth 2 (two) times daily as needed for refractory nausea / vomiting. Start on day 3 after chemo.  . potassium chloride SA (K-DUR,KLOR-CON) 20 MEQ tablet SEE NOTES  . pregabalin (LYRICA) 50 MG capsule Take 1 capsule (50 mg total) by mouth 2 (two) times daily. (Patient taking differently: Take 50 mg by mouth daily. )  . prochlorperazine (COMPAZINE) 10 MG tablet Take 10 mg by mouth every 6 (six) hours as needed for nausea or vomiting.  . triamcinolone ointment (KENALOG) 0.5 % Apply 1 application topically 2 (two) times daily.  . [DISCONTINUED] morphine (MSIR) 15 MG tablet Take 1 tablet (15 mg total) by mouth every 6 (six) hours as needed for severe pain.  Marland Kitchen dexamethasone  (DECADRON) 4 MG tablet Take 2 tablets (8 mg total) by mouth daily. Start the day after chemotherapy for 2 days. (Patient not taking: Reported on 03/16/2019)  . traMADol (ULTRAM) 50 MG tablet Take 1 tablet (50 mg total) by mouth every 6 (six) hours as needed. (Patient not taking: Reported on 04/07/2019)   Facility-Administered Encounter Medications as of 04/07/2019  Medication  . sodium chloride flush (NS) 0.9 % injection 10 mL  . sodium chloride flush (NS) 0.9 % injection 10 mL    Activities of Daily Living In your present state of health, do you have any difficulty performing the following activities: 04/07/2019  Hearing? N  Comment declines hearing aids  Vision? N  Comment reading glasses  Difficulty concentrating or making decisions? N  Walking or climbing stairs? N  Dressing or bathing? N  Doing errands, shopping? N  Preparing Food and eating ? N  Using the Toilet? N  In the past six months, have you accidently leaked urine? Y  Comment bladder leakage after surgery for endometrial ca, wears pads now occasionally for protection  Do you have problems with loss of bowel control? N  Managing your Medications? N  Managing your Finances? N  Housekeeping or managing your Housekeeping? N  Some recent data might be hidden    Patient Care Team: Steele Sizer, MD as PCP - General (Family Medicine) Rubie Maid, MD as Consulting Physician (Obstetrics and Gynecology) Clent Jacks, RN as Registered Nurse    Assessment:   This is a routine wellness examination for Abigael.  Exercise Activities and Dietary recommendations Current Exercise Habits: Home exercise routine, Type of exercise: stretching, Time (Minutes): 20, Frequency (Times/Week): 1, Weekly Exercise (Minutes/Week): 20, Intensity: Mild, Exercise limited by: respiratory conditions(s);orthopedic condition(s)  Goals    . DIET - INCREASE WATER INTAKE     Recommend to drink at least 6-8 8oz glasses of water per day.       Fall  Risk Fall Risk  04/07/2019 02/25/2019 01/14/2019 01/11/2019 11/19/2018  Falls in the past year? 0 0 0 0 0  Number falls in past yr: 0 - - 0 -  Injury with Fall? 0 - - 0 -  Risk for fall due to : - - - - -  Risk for fall due to: Comment - - - - -  Follow up Falls prevention discussed - - - -   FALL RISK PREVENTION PERTAINING TO THE HOME:  Any stairs in or around the home? No  If so, do they handrails? No   Home free of loose throw rugs in walkways, pet beds, electrical cords, etc? Yes  Adequate lighting in your home to reduce risk of falls? Yes   ASSISTIVE DEVICES UTILIZED TO PREVENT FALLS:  Life alert? No  Use of a cane, walker or w/c? No  Grab bars in the bathroom? Yes  Shower chair or bench in shower? Yes  Elevated toilet seat or a handicapped toilet? Yes   DME ORDERS:  DME order needed?  No   TIMED UP AND GO:  Was the test performed? Yes .  Length of time to ambulate 10 feet: 6 sec.   GAIT:  Appearance of gait: Gait stead-fast and without the use of an assistive device.  Education: Fall risk prevention has been discussed.  Intervention(s) required? No   Depression Screen PHQ 2/9 Scores 04/07/2019 11/19/2018 10/20/2018 09/07/2018  PHQ - 2 Score 0 0 0 2  PHQ- 9 Score - 0 0 8     Cognitive Function     6CIT Screen 04/07/2019 04/06/2018  What Year? 0 points 0 points  What month? 0 points 0 points  What time? 0 points 3 points  Count back from 20 0 points 0 points  Months in reverse 0 points 0 points  Repeat phrase 0 points 0 points  Total Score 0 3    Immunization History  Administered Date(s) Administered  . Influenza, High Dose Seasonal PF 11/11/2016, 10/02/2017, 09/07/2018  . Pneumococcal Conjugate-13 04/06/2018  . Pneumococcal Polysaccharide-23 11/11/2016  . Tdap 11/11/2016    Qualifies for Shingles Vaccine? Yes  after completing radiation treatments. Due for Shingrix. Education has been provided regarding the importance of this vaccine. Pt has been advised  to call insurance company to determine out of pocket expense. Advised may also receive vaccine at local pharmacy or Health Dept. Verbalized acceptance and understanding.  Tdap: Up to date  Flu Vaccine: Up to date  Pneumococcal Vaccine: Up to date   Screening Tests Health Maintenance  Topic Date Due  . MAMMOGRAM  02/02/2018  . INFLUENZA VACCINE  07/30/2019  . COLONOSCOPY  05/05/2021  . TETANUS/TDAP  11/11/2026  . DEXA SCAN  Completed  . Hepatitis C Screening  Completed  . PNA vac Low Risk Adult  Completed    Cancer Screenings:  Colorectal Screening: Completed 05/05/18. Repeat every 3 years  Mammogram: Completed 02/02/17. Repeat every year; Ordered today. Pt provided with contact information and advised to call to schedule appt.   Bone Density: Completed 02/02/17. Results reflect OSTEOPENIA. Repeat every 2 years. Ordered today. Pt provided with contact information and advised to call to schedule appt.   Lung Cancer Screening: (Low Dose CT Chest recommended if Age 12-80 years, 30 pack-year currently smoking OR have quit w/in 15years.) does not qualify.   Additional Screening:  Hepatitis C Screening: does qualify; Completed 01/23/17  Vision Screening: Recommended annual ophthalmology exams for early detection of glaucoma and other disorders of the eye. Is the patient up to date with their annual eye exam?  No  Who is the provider  or what is the name of the office in which the pt attends annual eye exams? Dr. Gloriann Loan   Dental Screening: Recommended annual dental exams for proper oral hygiene  Community Resource Referral:  CRR required this visit?  No      Plan:    I have personally reviewed and addressed the Medicare Annual Wellness questionnaire and have noted the following in the patient's chart:  A. Medical and social history B. Use of alcohol, tobacco or illicit drugs  C. Current medications and supplements D. Functional ability and status E.  Nutritional status F.   Physical activity G. Advance directives H. List of other physicians I.  Hospitalizations, surgeries, and ER visits in previous 12 months J.  [place] such as hearing and vision if needed, cognitive and depression L. Referrals and appointments   In addition, I have reviewed and discussed with patient certain preventive protocols, quality metrics, and best practice recommendations. A written personalized care plan for preventive services as well as general preventive health recommendations were provided to patient.   Signed,  Clemetine Marker, LPN Nurse Health Advisor   Nurse Notes: pt currently undergoing radiation therapy for endometrial cx. She is in good spirits and appreciative of virtual wellness visit today

## 2019-04-07 NOTE — Telephone Encounter (Signed)
Refill request for general medication. Lyrica and Lexapro   Last office visit 01/11/2019   Follow up on 04/08/2019

## 2019-04-07 NOTE — Patient Instructions (Signed)
Molly [name] , Thank you for taking time to come for your Medicare Wellness Visit. I appreciate your ongoing commitment to your health goals. Please review the following plan we discussed and let me know if I can assist you in the future.   Screening recommendations/referrals: Colonoscopy: done 05/05/18. Repeat in 2022. Mammogram: done 02/02/17. Please call [phone] to schedule your mammogram and bone density screening. Bone Density: done 02/02/17 Recommended yearly ophthalmology/optometry visit for glaucoma screening and checkup Recommended yearly dental visit for hygiene and checkup  Vaccinations: Influenza vaccine: done 09/07/18  Pneumococcal vaccine: done 04/06/18 Tdap vaccine: done 11/11/16 Shingles vaccine: Shingrix discussed. Please contact your pharmacy for coverage information.   Advanced directives: Advance directive discussed with you today. I have mailed a copy for you to complete at home and have notarized. Once this is complete please bring a copy in to our office so we can scan it into your chart.  Conditions/risks identified: Recommend drinking 6-8 glasses of water per day.   Next appointment: Please follow up in one year for your Medicare Annual Wellness visit.   Preventive Care 1 Years and Older, Female Preventive care refers to lifestyle choices and visits with your health care provider that can promote health and wellness. What does preventive care include?  A yearly physical exam. This is also called an annual well check.  Dental exams once or twice a year.  Routine eye exams. Ask your health care provider how often you should have your eyes checked.  Personal lifestyle choices, including:  Daily care of your teeth and gums.  Regular physical activity.  Eating a healthy diet.  Avoiding tobacco and drug use.  Limiting alcohol use.  Practicing safe sex.  Taking low-dose aspirin every day.  Taking vitamin and mineral supplements as recommended by your  health care provider. What happens during an annual well check? The services and screenings done by your health care provider during your annual well check will depend on your age, overall health, lifestyle risk factors, and family history of disease. Counseling  Your health care provider may ask you questions about your:  Alcohol use.  Tobacco use.  Drug use.  Emotional well-being.  Home and relationship well-being.  Sexual activity.  Eating habits.  History of falls.  Memory and ability to understand (cognition).  Work and work Statistician.  Reproductive health. Screening  You may have the following tests or measurements:  Height, weight, and BMI.  Blood pressure.  Lipid and cholesterol levels. These may be checked every 5 years, or more frequently if you are over 26 years old.  Skin check.  Lung cancer screening. You may have this screening every year starting at age 63 if you have a 30-pack-year history of smoking and currently smoke or have quit within the past 15 years.  Fecal occult blood test (FOBT) of the stool. You may have this test every year starting at age 67.  Flexible sigmoidoscopy or colonoscopy. You may have a sigmoidoscopy every 5 years or a colonoscopy every 10 years starting at age 63.  Hepatitis C blood test.  Hepatitis B blood test.  Sexually transmitted disease (STD) testing.  Diabetes screening. This is done by checking your blood sugar (glucose) after you have not eaten for a while (fasting). You may have this done every 1-3 years.  Bone density scan. This is done to screen for osteoporosis. You may have this done starting at age 49.  Mammogram. This may be done every 1-2 years. Talk to your  health care provider about how often you should have regular mammograms. Talk with your health care provider about your test results, treatment options, and if necessary, the need for more tests. Vaccines  Your health care provider may recommend  certain vaccines, such as:  Influenza vaccine. This is recommended every year.  Tetanus, diphtheria, and acellular pertussis (Tdap, Td) vaccine. You may need a Td booster every 10 years.  Zoster vaccine. You may need this after age 51.  Pneumococcal 13-valent conjugate (PCV13) vaccine. One dose is recommended after age 78.  Pneumococcal polysaccharide (PPSV23) vaccine. One dose is recommended after age 28. Talk to your health care provider about which screenings and vaccines you need and how often you need them. This information is not intended to replace advice given to you by your health care provider. Make sure you discuss any questions you have with your health care provider. Document Released: 01/11/2016 Document Revised: 09/03/2016 Document Reviewed: 10/16/2015 Elsevier Interactive Patient Education  2017 [place] Prevention in the Home Falls can cause injuries. They can happen to people of all ages. There are many things you can do to make your home safe and to help prevent falls. What can I do on the outside of my home?  Regularly fix the edges of walkways and driveways and fix any cracks.  Remove anything that might make you trip as you walk through a door, such as a raised step or threshold.  Trim any bushes or trees on the path to your home.  Use bright outdoor lighting.  Clear any walking paths of anything that might make someone trip, such as rocks or tools.  Regularly check to see if handrails are loose or broken. Make sure that both sides of any steps have handrails.  Any raised decks and porches should have guardrails on the edges.  Have any leaves, snow, or ice cleared regularly.  Use sand or salt on walking paths during winter.  Clean up any spills in your garage right away. This includes oil or grease spills. What can I do in the bathroom?  Use night lights.  Install grab bars by the toilet and in the tub and shower. Do not use towel bars as  grab bars.  Use non-skid mats or decals in the tub or shower.  If you need to sit down in the shower, use a plastic, non-slip stool.  Keep the floor dry. Clean up any water that spills on the floor as soon as it happens.  Remove soap buildup in the tub or shower regularly.  Attach bath mats securely with double-sided non-slip rug tape.  Do not have throw rugs and other things on the floor that can make you trip. What can I do in the bedroom?  Use night lights.  Make sure that you have a light by your bed that is easy to reach.  Do not use any sheets or blankets that are too big for your bed. They should not hang down onto the floor.  Have a firm chair that has side arms. You can use this for support while you get dressed.  Do not have throw rugs and other things on the floor that can make you trip. What can I do in the kitchen?  Clean up any spills right away.  Avoid walking on wet floors.  Keep items that you use a lot in easy-to-reach places.  If you need to reach something above you, use a strong step stool that has a  grab bar.  Keep electrical cords out of the way.  Do not use floor polish or wax that makes floors slippery. If you must use wax, use non-skid floor wax.  Do not have throw rugs and other things on the floor that can make you trip. What can I do with my stairs?  Do not leave any items on the stairs.  Make sure that there are handrails on both sides of the stairs and use them. Fix handrails that are broken or loose. Make sure that handrails are as long as the stairways.  Check any carpeting to make sure that it is firmly attached to the stairs. Fix any carpet that is loose or worn.  Avoid having throw rugs at the top or bottom of the stairs. If you do have throw rugs, attach them to the floor with carpet tape.  Make sure that you have a light switch at the top of the stairs and the bottom of the stairs. If you do not have them, ask someone to add them  for you. What else can I do to help prevent falls?  Wear shoes that:  Do not have high heels.  Have rubber bottoms.  Are comfortable and fit you well.  Are closed at the toe. Do not wear sandals.  If you use a stepladder:  Make sure that it is fully opened. Do not climb a closed stepladder.  Make sure that both sides of the stepladder are locked into place.  Ask someone to hold it for you, if possible.  Clearly mark and make sure that you can see:  Any grab bars or handrails.  First and last steps.  Where the edge of each step is.  Use tools that help you move around (mobility aids) if they are needed. These include:  Canes.  Walkers.  Scooters.  Crutches.  Turn on the lights when you go into a dark area. Replace any light bulbs as soon as they burn out.  Set up your furniture so you have a clear path. Avoid moving your furniture around.  If any of your floors are uneven, fix them.  If there are any pets around you, be aware of where they are.  Review your medicines with your doctor. Some medicines can make you feel dizzy. This can increase your chance of falling. Ask your doctor what other things that you can do to help prevent falls. This information is not intended to replace advice given to you by your health care provider. Make sure you discuss any questions you have with your health care provider. Document Released: 10/11/2009 Document Revised: 05/22/2016 Document Reviewed: 01/19/2015 Elsevier Interactive Patient Education  2017 [employer].

## 2019-04-08 ENCOUNTER — Ambulatory Visit
Admission: RE | Admit: 2019-04-08 | Discharge: 2019-04-08 | Disposition: A | Discharge status: Home / Self Care | Payer: Medicare Other | Source: Ambulatory Visit | Attending: Radiation Oncology | Admitting: Radiation Oncology

## 2019-04-08 ENCOUNTER — Other Ambulatory Visit: Payer: Self-pay

## 2019-04-08 DIAGNOSIS — Z510 Encounter for antineoplastic radiation therapy: Secondary | ICD-10-CM | POA: Diagnosis not present

## 2019-04-10 ENCOUNTER — Other Ambulatory Visit: Payer: Self-pay

## 2019-04-11 ENCOUNTER — Other Ambulatory Visit: Payer: Self-pay

## 2019-04-11 ENCOUNTER — Ambulatory Visit
Admission: RE | Admit: 2019-04-11 | Discharge: 2019-04-11 | Disposition: A | Discharge status: Home / Self Care | Payer: Medicare Other | Source: Ambulatory Visit | Attending: Radiation Oncology | Admitting: Radiation Oncology

## 2019-04-11 DIAGNOSIS — Z510 Encounter for antineoplastic radiation therapy: Secondary | ICD-10-CM | POA: Diagnosis not present

## 2019-04-12 ENCOUNTER — Ambulatory Visit
Admission: RE | Admit: 2019-04-12 | Discharge: 2019-04-12 | Disposition: A | Discharge status: Home / Self Care | Payer: Medicare Other | Source: Ambulatory Visit | Attending: Radiation Oncology | Admitting: Radiation Oncology

## 2019-04-12 ENCOUNTER — Other Ambulatory Visit: Payer: Self-pay

## 2019-04-12 DIAGNOSIS — Z510 Encounter for antineoplastic radiation therapy: Secondary | ICD-10-CM | POA: Diagnosis not present

## 2019-04-13 ENCOUNTER — Ambulatory Visit
Admission: RE | Admit: 2019-04-13 | Discharge: 2019-04-13 | Disposition: A | Discharge status: Home / Self Care | Payer: Medicare Other | Source: Ambulatory Visit | Attending: Radiation Oncology | Admitting: Radiation Oncology

## 2019-04-13 ENCOUNTER — Other Ambulatory Visit: Payer: Self-pay

## 2019-04-13 DIAGNOSIS — Z510 Encounter for antineoplastic radiation therapy: Secondary | ICD-10-CM | POA: Diagnosis not present

## 2019-04-14 ENCOUNTER — Ambulatory Visit
Admission: RE | Admit: 2019-04-14 | Discharge: 2019-04-14 | Disposition: A | Discharge status: Home / Self Care | Payer: Medicare Other | Source: Ambulatory Visit | Attending: Radiation Oncology | Admitting: Radiation Oncology

## 2019-04-14 ENCOUNTER — Other Ambulatory Visit: Payer: Self-pay

## 2019-04-14 ENCOUNTER — Inpatient Hospital Stay: Payer: Medicare Other | Attending: Radiation Oncology

## 2019-04-14 DIAGNOSIS — Z510 Encounter for antineoplastic radiation therapy: Secondary | ICD-10-CM | POA: Diagnosis not present

## 2019-04-14 DIAGNOSIS — R530 Neoplastic (malignant) related fatigue: Secondary | ICD-10-CM | POA: Insufficient documentation

## 2019-04-14 DIAGNOSIS — C541 Malignant neoplasm of endometrium: Secondary | ICD-10-CM | POA: Diagnosis present

## 2019-04-14 LAB — COMPREHENSIVE METABOLIC PANEL
ALT: 12 U/L (ref 0–44)
AST: 19 U/L (ref 15–41)
Albumin: 4 g/dL (ref 3.5–5.0)
Alkaline Phosphatase: 60 U/L (ref 38–126)
Anion gap: 7 (ref 5–15)
BUN: 14 mg/dL (ref 8–23)
CO2: 27 mmol/L (ref 22–32)
Calcium: 9.2 mg/dL (ref 8.9–10.3)
Chloride: 103 mmol/L (ref 98–111)
Creatinine, Ser: 0.64 mg/dL (ref 0.44–1.00)
GFR calc Af Amer: >60 mL/min (ref >60)
GFR calc non Af Amer: >60 mL/min (ref >60)
Glucose, Bld: 102 mg/dL — ABNORMAL HIGH (ref 70–99)
Potassium: 3.7 mmol/L (ref 3.5–5.1)
Sodium: 137 mmol/L (ref 135–145)
Total Bilirubin: 0.5 mg/dL (ref 0.3–1.2)
Total Protein: 7.1 g/dL (ref 6.5–8.1)

## 2019-04-14 LAB — CBC WITH DIFFERENTIAL/PLATELET
Abs Immature Granulocytes: 0.01 10*3/uL (ref 0.00–0.07)
Basophils Absolute: 0 10*3/uL (ref 0.0–0.1)
Basophils Relative: 0 %
Eosinophils Absolute: 0 10*3/uL (ref 0.0–0.5)
Eosinophils Relative: 0 %
HCT: 31.5 % — ABNORMAL LOW (ref 36.0–46.0)
Hemoglobin: 10.6 g/dL — ABNORMAL LOW (ref 12.0–15.0)
Immature Granulocytes: 0 %
Lymphocytes Relative: 26 %
Lymphs Abs: 0.7 10*3/uL (ref 0.7–4.0)
MCH: 32.6 pg (ref 26.0–34.0)
MCHC: 33.7 g/dL (ref 30.0–36.0)
MCV: 96.9 fL (ref 80.0–100.0)
Monocytes Absolute: 0.3 10*3/uL (ref 0.1–1.0)
Monocytes Relative: 12 %
Neutro Abs: 1.6 10*3/uL — ABNORMAL LOW (ref 1.7–7.7)
Neutrophils Relative %: 62 %
Platelets: 200 10*3/uL (ref 150–400)
RBC: 3.25 MIL/uL — ABNORMAL LOW (ref 3.87–5.11)
RDW: 13.3 % (ref 11.5–15.5)
WBC: 2.6 10*3/uL — ABNORMAL LOW (ref 4.0–10.5)
nRBC: 0 % (ref 0.0–0.2)

## 2019-04-15 ENCOUNTER — Encounter: Payer: Medicare Other | Admitting: Obstetrics and Gynecology

## 2019-04-15 ENCOUNTER — Ambulatory Visit
Admission: RE | Admit: 2019-04-15 | Discharge: 2019-04-15 | Disposition: A | Discharge status: Home / Self Care | Payer: Medicare Other | Source: Ambulatory Visit | Attending: Radiation Oncology | Admitting: Radiation Oncology

## 2019-04-15 ENCOUNTER — Other Ambulatory Visit: Payer: Self-pay

## 2019-04-15 DIAGNOSIS — Z510 Encounter for antineoplastic radiation therapy: Secondary | ICD-10-CM | POA: Diagnosis not present

## 2019-04-18 ENCOUNTER — Inpatient Hospital Stay (HOSPITAL_BASED_OUTPATIENT_CLINIC_OR_DEPARTMENT_OTHER): Payer: Medicare Other | Admitting: Oncology

## 2019-04-18 ENCOUNTER — Other Ambulatory Visit: Payer: Self-pay | Admitting: *Deleted

## 2019-04-18 ENCOUNTER — Ambulatory Visit: Payer: Medicare Other | Admitting: Oncology

## 2019-04-18 ENCOUNTER — Other Ambulatory Visit: Payer: Self-pay

## 2019-04-18 ENCOUNTER — Ambulatory Visit
Admission: RE | Admit: 2019-04-18 | Discharge: 2019-04-18 | Disposition: A | Discharge status: Home / Self Care | Payer: Medicare Other | Source: Ambulatory Visit | Attending: Radiation Oncology | Admitting: Radiation Oncology

## 2019-04-18 ENCOUNTER — Inpatient Hospital Stay: Payer: Medicare Other | Admitting: Oncology

## 2019-04-18 ENCOUNTER — Telehealth: Payer: Self-pay | Admitting: *Deleted

## 2019-04-18 ENCOUNTER — Inpatient Hospital Stay: Payer: Medicare Other

## 2019-04-18 DIAGNOSIS — T451X5S Adverse effect of antineoplastic and immunosuppressive drugs, sequela: Secondary | ICD-10-CM

## 2019-04-18 DIAGNOSIS — T451X5A Adverse effect of antineoplastic and immunosuppressive drugs, initial encounter: Principal | ICD-10-CM

## 2019-04-18 DIAGNOSIS — D701 Agranulocytosis secondary to cancer chemotherapy: Secondary | ICD-10-CM

## 2019-04-18 DIAGNOSIS — C541 Malignant neoplasm of endometrium: Secondary | ICD-10-CM

## 2019-04-18 DIAGNOSIS — G620 Drug-induced polyneuropathy: Secondary | ICD-10-CM

## 2019-04-18 DIAGNOSIS — Z510 Encounter for antineoplastic radiation therapy: Secondary | ICD-10-CM | POA: Diagnosis not present

## 2019-04-18 MED ORDER — LIDOCAINE-PRILOCAINE 2.5-2.5 % EX CREA: TOPICAL_CREAM | CUTANEOUS | 3 refills | Status: DC

## 2019-04-18 NOTE — Telephone Encounter (Signed)
Called patient back after she spoke to Dr. Janese Banks on the phone and she was feeling like she was dry and needed IVf. Spoke to infusion staff and she can get fluids in am after she finishes radiation. Patient is agreeable and I reminded her to check in for the fluid visit before she goes to radiation.

## 2019-04-19 ENCOUNTER — Inpatient Hospital Stay: Payer: Medicare Other

## 2019-04-19 ENCOUNTER — Other Ambulatory Visit: Payer: Self-pay

## 2019-04-19 ENCOUNTER — Ambulatory Visit
Admission: RE | Admit: 2019-04-19 | Discharge: 2019-04-19 | Disposition: A | Discharge status: Home / Self Care | Payer: Medicare Other | Source: Ambulatory Visit | Attending: Radiation Oncology | Admitting: Radiation Oncology

## 2019-04-19 VITALS — BP 116/76 | HR 54 | Resp 18

## 2019-04-19 DIAGNOSIS — Z510 Encounter for antineoplastic radiation therapy: Secondary | ICD-10-CM | POA: Diagnosis not present

## 2019-04-19 DIAGNOSIS — C541 Malignant neoplasm of endometrium: Secondary | ICD-10-CM

## 2019-04-19 DIAGNOSIS — R530 Neoplastic (malignant) related fatigue: Secondary | ICD-10-CM | POA: Diagnosis not present

## 2019-04-19 DIAGNOSIS — D509 Iron deficiency anemia, unspecified: Secondary | ICD-10-CM

## 2019-04-19 MED ORDER — SODIUM CHLORIDE 0.9% FLUSH
10.0000 mL | Freq: Once | INTRAVENOUS | Status: DC | PRN
  Filled 2019-04-19: qty 10

## 2019-04-19 MED ORDER — HEPARIN SOD (PORK) LOCK FLUSH 100 UNIT/ML IV SOLN
500.0000 [IU] | Freq: Once | INTRAVENOUS | Status: AC | PRN
  Administered 2019-04-19: 12:00:00 500 [IU]
  Filled 2019-04-19: qty 5

## 2019-04-19 MED ORDER — SODIUM CHLORIDE 0.9 % IV SOLN
Freq: Once | INTRAVENOUS | Status: AC
  Administered 2019-04-19: 11:00:00 via INTRAVENOUS
  Filled 2019-04-19: qty 250

## 2019-04-19 NOTE — Progress Notes (Signed)
Pt reports swelling in legs, feet, and ankles. Legs elevated. Dr. Janese Banks aware. Dr. Janese Banks to infusion to assess pt, NNO at this time. Pt and VS stable at discharge.

## 2019-04-19 NOTE — Progress Notes (Signed)
I connected with Molly [name] on 77/41/28 at 11:15 AM EDT by video enabled telemedicine visit and verified that I am speaking with the correct person using two identifiers.   I discussed the limitations, risks, security and privacy concerns of performing an evaluation and management service by telemedicine and the availability of in-person appointments. I also discussed with the patient that there may be a patient responsible charge related to this service. The patient expressed understanding and agreed to proceed.  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 Marcelline Mates for cystocele and rectocele. She was noted to have postmenopausal bleeding and cramping recently. Ultrasound revealed heterogeneous appearance of uterus with fibroids invading the endometrium. She underwent an endometrial biopsy when she had a second bout of postmenopausal bleeding. Biopsy showed endometrioid adenocarcinoma FIGO grade 1. She was then seen by Dr. Fransisca Connors and plan was to proceed with laparoscopic hysterectomy and bilateral salpingo-oophorectomy along with pelvic lymph node sampling  Final pathology showed: Endometrioid carcinoma FIGO grade 2 with 33% myometrial invasion. Lymphovascular invasion present. Peritoneal/ascites fluid atypical. 2 out of 3 sentinel lymph nodes were positive for macro metastases. pT1a pN1a. FIGO IIIC1  Patient's case was discussed at tumor board and consensus was to proceed with 3 cycles of adjuvant carbotaxol followed by radiation followed by 3 more cycles of chemotherapy. Patient is already met with Dr. Donella Stade who will be giving radiation  therapy to pelvic and periaortic lymph nodes and boost to her vaginal cuff. PET CT scan showed metastatic adenopathy and retroperitoneal and external iliac group of lymph nodes in the right side. No evidence of metastatic disease elsewhere.  First cycle of chemotherapy was given on 11/05/2018.  She completed 6 cycles on 02/25/2019. She is currently undergoing radiation treatment to her pelvis and para-aortic lymph nodes   Interval history does report feeling fatigued.  Had mild self-limited diarrhea which is controlled with Imodium.  She is trying to keep up with her fluid intake but does admit to falling short of it on some occasions.  Peripheral neuropathy has remained stable and she is currently undergoing acupuncture for the same   Review of Systems  Constitutional: Positive for malaise/fatigue. Negative for chills, fever and weight loss.  HENT: Negative for congestion, ear discharge and nosebleeds.   Eyes: Negative for blurred vision.  Respiratory: Negative for cough, hemoptysis, sputum production, shortness of breath and wheezing.   Cardiovascular: Negative for chest pain, palpitations, orthopnea and claudication.  Gastrointestinal: Negative for abdominal pain, blood in stool, constipation, diarrhea, heartburn, melena, nausea and vomiting.  Genitourinary: Negative for dysuria, flank pain, frequency, hematuria and urgency.  Musculoskeletal: Negative for back pain, joint pain and myalgias.  Skin: Negative for rash.  Neurological: Negative for dizziness, tingling, focal weakness, seizures, weakness and headaches.  Endo/Heme/Allergies: Does not bruise/bleed easily.  Psychiatric/Behavioral: Negative for depression and suicidal ideas. The patient does not have insomnia.     Allergies  Allergen Reactions  . Ferumoxytol Anaphylaxis  . Nsaids Hives and Rash  . Statins Other (See Comments)    Joint pains  . Victoza [Liraglutide] Other (See Comments)    pancreatitis  . Oxycodone Nausea  And Vomiting  . Crestor [Rosuvastatin Calcium] Rash  Past Medical History:  Diagnosis Date  . Anxiety   . Asthma    history of asthma  . Cardiomyopathy ([place])   . Complication of anesthesia    tore hair out and made teeth rough  . Depression   . Endometrial cancer ([place]) 08/2018  . GERD (gastroesophageal reflux disease)    takes prilosec prn  . History of CVA (cerebrovascular accident) 12/10/2015  . Hyperlipidemia LDL goal <70 12/10/2015  . Hypertension   . Prediabetes 10/02/2017   A1c 6 in January 2018  . Stroke [facility]) 1990    no residual effects    Past Surgical History:  Procedure Laterality Date  . ABDOMINAL HYSTERECTOMY    . CHOLECYSTECTOMY    . COLONOSCOPY WITH PROPOFOL N/A 01/08/2017   Procedure: COLONOSCOPY WITH PROPOFOL;  Surgeon: [name], MD;  Location: [facility] ENDOSCOPY;  Service: Endoscopy;  Laterality: N/A;  . COLONOSCOPY WITH PROPOFOL N/A 05/05/2018   Procedure: COLONOSCOPY WITH PROPOFOL;  Surgeon: [name], MD;  Location: [facility] ENDOSCOPY;  Service: Gastroenterology;  Laterality: N/A;  . HERNIA REPAIR  [id number]   umbilical  . PORTACATH PLACEMENT N/A 11/04/2018   Procedure: INSERTION PORT-A-CATH;  Surgeon: Jules Husbands, MD;  Location: [facility] ORS;  Service: General;  Laterality: N/A;  . SENTINEL NODE BIOPSY N/A 10/06/2018   Procedure: SENTINEL NODE BIOPSY;  Surgeon: Mellody Drown, MD;  Location: [facility] ORS;  Service: Gynecology;  Laterality: N/A;  . UMBILICAL HERNIA REPAIR N/A 11/17/2017   Procedure: HERNIA REPAIR UMBILICAL ADULT;  Surgeon: Jules Husbands, MD;  Location: [facility] ORS;  Service: General;  Laterality: N/A;    Social History   Socioeconomic History  . Marital status: Divorced    Spouse name: Not on file  . Number of children: 2  . Years of education: some college  . Highest education level: 12th grade  Occupational History  . 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 Occupation: currently a Careers adviser  . Financial  resource strain: Not hard at all  . Food insecurity:    Worry: Never true    Inability: Never true  . Transportation needs:    Medical: No    Non-medical: No  Tobacco Use  . Smoking status: Never Smoker  . Smokeless tobacco: Never Used  . Tobacco comment: smoking cessation materials not required  Substance and Sexual Activity  . Alcohol use: No    Alcohol/week: 0.0 standard drinks  . Drug use: No  . Sexual activity: Not Currently  Lifestyle  . Physical activity:    Days per week: 1 day    Minutes per session: 20 min  . Stress: Not at all  Relationships  . Social connections:    Talks on phone: More than three times a week    Gets together: Three times a week    Attends religious service: More than 4 times per year    Active member of club or organization: Yes    Attends meetings of clubs or organizations: More than 4 times per year    Relationship status: Divorced  . Intimate partner violence:    Fear of current or ex partner: No    Emotionally abused: No    Physically abused: No    Forced sexual activity: No  Other Topics Concern  . Not on file  Social History Narrative  . Not on file    Family History  Problem Relation Age of Onset  . Stroke Mother   .  Hypertension Mother   . Dementia Mother   . Alzheimer's disease Mother   . Gout Father   . Asthma Father   . Hypertension Father   . Dementia Father   . Healthy Sister   . Stroke Brother   . Alzheimer's disease Brother   . Healthy Daughter   . Hypertension Brother   . Healthy Brother   . Cancer Paternal Grandmother   . Healthy Sister   . Healthy Sister   . Hypertension Sister   . Hypertension Sister   . Stroke Brother   . Alzheimer's disease Brother   . Stroke Brother   . Hypertension Brother   . Stroke Brother   . Hypertension Brother   . Healthy Brother   . Healthy Brother   . Hypertension Daughter   . Breast cancer Neg Hx      Current Outpatient Medications:  .  acetaminophen (TYLENOL) 500  MG tablet, Take 2 tablets (1,000 mg total) by mouth every 6 (six) hours as needed for mild pain or moderate pain., Disp: 30 tablet, Rfl: 0 .  albuterol (PROVENTIL HFA;VENTOLIN HFA) 108 (90 Base) MCG/ACT inhaler, Inhale 2 puffs into the lungs every 4 (four) hours as needed for wheezing or shortness of breath., Disp: 1 Inhaler, Rfl: 0 .  escitalopram (LEXAPRO) 10 MG tablet, Take 1 tablet (10 mg total) by mouth daily., Disp: 90 tablet, Rfl: 0 .  ezetimibe (ZETIA) 10 MG tablet, Take 1 tablet (10 mg total) by mouth daily., Disp: 90 tablet, Rfl: 0 .  ketoconazole (NIZORAL) 2 % cream, Apply 1 application topically daily as needed for irritation. On abdominal fold prn, Disp: 60 g, Rfl: 0 .  loperamide (IMODIUM) 2 MG capsule, Take 2 mg by mouth as needed for diarrhea or loose stools., Disp: , Rfl:  .  loratadine (CLARITIN) 10 MG tablet, Take 1 tablet by mouth daily., Disp: 30 tablet, Rfl: 5 .  magnesium oxide (MAG-OX) 400 (241.3 Mg) MG tablet, Take 1 tablet (400 mg total) by mouth daily. For hypomagnesemia, Disp: 30 tablet, Rfl: 0 .  olmesartan-hydrochlorothiazide (BENICAR HCT) 20-12.5 MG tablet, Take 1 tablet by mouth daily., Disp: 90 tablet, Rfl: 0 .  omeprazole (PRILOSEC OTC) 20 MG tablet, Take 20 mg by mouth daily as needed., Disp: , Rfl:  .  ondansetron (ZOFRAN) 8 MG tablet, Take 1 tablet (8 mg total) by mouth 2 (two) times daily as needed for refractory nausea / vomiting. Start on day 3 after chemo., Disp: 30 tablet, Rfl: 1 .  Potassium 99 MG TABS, Take 1 tablet by mouth daily., Disp: , Rfl:  .  prochlorperazine (COMPAZINE) 10 MG tablet, Take 10 mg by mouth every 6 (six) hours as needed for nausea or vomiting., Disp: , Rfl:  .  triamcinolone ointment (KENALOG) 0.5 %, Apply 1 application topically 2 (two) times daily., Disp: 30 g, Rfl: 0 .  diclofenac sodium (VOLTAREN) 1 % GEL, Apply 2 g topically 3 (three) times daily as needed (pain). (Patient not taking: Reported on 04/18/2019), Disp: 100 g, Rfl: 1 .   fluticasone furoate-vilanterol (BREO ELLIPTA) 100-25 MCG/INH AEPB, Inhale 1 puff into the lungs daily. (Patient not taking: Reported on 04/18/2019), Disp: 60 each, Rfl: 0 .  furosemide (LASIX) 40 MG tablet, Take 1 tablet (40 mg total) by mouth daily as needed for fluid., Disp: 90 tablet, Rfl: 0 .  lidocaine-prilocaine (EMLA) cream, Apply to affected area once, Disp: 30 g, Rfl: 3 .  LORazepam (ATIVAN) 0.5 MG tablet, Take 1 tablet (0.5  mg total) by mouth every 6 (six) hours as needed (Nausea or vomiting). (Patient not taking: Reported on 04/18/2019), Disp: 30 tablet, Rfl: 0 .  pregabalin (LYRICA) 50 MG capsule, Take 1 capsule (50 mg total) by mouth 2 (two) times daily., Disp: 60 capsule, Rfl: 0 .  traMADol (ULTRAM) 50 MG tablet, Take 1 tablet (50 mg total) by mouth every 6 (six) hours as needed. (Patient not taking: Reported on 04/07/2019), Disp: 15 tablet, Rfl: 0 No current facility-administered medications for this visit.   Facility-Administered Medications Ordered in Other Visits:  .  sodium chloride flush (NS) 0.9 % injection 10 mL, 10 mL, Intravenous, PRN, Sindy Guadeloupe, MD, 10 mL at 11/15/18 0915 .  sodium chloride flush (NS) 0.9 % injection 10 mL, 10 mL, Intracatheter, Once PRN, Sindy Guadeloupe, MD     CMP Latest Ref Rng & Units 04/14/2019  Glucose 70 - 99 mg/dL 102(H)  BUN 8 - 23 mg/dL 14  Creatinine 0.44 - 1.00 mg/dL 0.64  Sodium 135 - 145 mmol/L 137  Potassium 3.5 - 5.1 mmol/L 3.7  Chloride 98 - 111 mmol/L 103  CO2 22 - 32 mmol/L 27  Calcium 8.9 - 10.3 mg/dL 9.2  Total Protein 6.5 - 8.1 g/dL 7.1  Total Bilirubin 0.3 - 1.2 mg/dL 0.5  Alkaline Phos 38 - 126 U/L 60  AST 15 - 41 U/L 19  ALT 0 - 44 U/L 12   CBC Latest Ref Rng & Units 04/14/2019  WBC 4.0 - 10.5 K/uL 2.6(L)  Hemoglobin 12.0 - 15.0 g/dL 10.6(L)  Hematocrit 36.0 - 46.0 % 31.5(L)  Platelets 150 - 400 K/uL 200     Observation/objective: Patient appears in no acute distress over the video visit today.  Breathing is  nonlabored  Assessment and plan:invasive adenocarcinoma of the endometrium endometrioid subtypeFIGO Stage IIIC2 pT1a N2 M0.  She is s/p 6 cycles of carbotaxol chemotherapy.  She is currently undergoing radiation treatment to her pelvis and para-aortic lymph nodes.  This visit was a routine follow-up of her endometrial cancer  Patient does feel fatigued today and would like to receive IV fluids sometime this week.  We will make arrangements for that.  Chemo-induced peripheral neuropathy: Stable continue Lyrica. She has ongoing acupuncture  Patient still has chemo-induced neutropenia but her [place] is better at 1.6 improved from 0.9 before.  I suspect some degree of neutropenia may also be due to ongoing radiation treatment to her pelvis.  She does not have any signs and symptoms of infection.  Reviewed neutropenic precautions.  She has 8 more weeks of daily radiation treatment left and I have advised her to wear a mask when she comes to the cancer center.  Practice social distancing and remain at her house as much as possible.  Follow-up instructions: CBC with differential in 1 month.  Return to clinic in 3 months.  Port labs CBC with differential, CMP, Ca1 25.  See MD  I discussed the assessment and treatment plan with the patient. The patient was provided an opportunity to ask questions and all were answered. The patient agreed with the plan and demonstrated an understanding of the instructions.   The patient was advised to call back or seek an in-person evaluation if the symptoms worsen or if the condition fails to improve as anticipated.  Visit Diagnosis: 1. Chemotherapy-induced peripheral neuropathy ([place])   2. Endometrial adenocarcinoma ([place])   3. Chemotherapy induced neutropenia ([facility])     Dr. Randa Evens, MD, MPH [place] at  [facility] [phone] 04/19/2019 8:06 AM

## 2019-04-20 ENCOUNTER — Inpatient Hospital Stay: Payer: Medicare Other

## 2019-04-20 ENCOUNTER — Ambulatory Visit
Admission: RE | Admit: 2019-04-20 | Discharge: 2019-04-20 | Disposition: A | Discharge status: Home / Self Care | Payer: Medicare Other | Source: Ambulatory Visit | Attending: Radiation Oncology | Admitting: Radiation Oncology

## 2019-04-20 ENCOUNTER — Other Ambulatory Visit: Payer: Self-pay

## 2019-04-20 DIAGNOSIS — Z510 Encounter for antineoplastic radiation therapy: Secondary | ICD-10-CM | POA: Diagnosis not present

## 2019-04-21 ENCOUNTER — Ambulatory Visit: Payer: Medicare Other

## 2019-04-21 ENCOUNTER — Ambulatory Visit
Admission: RE | Admit: 2019-04-21 | Discharge: 2019-04-21 | Disposition: A | Discharge status: Home / Self Care | Payer: Medicare Other | Source: Ambulatory Visit | Attending: Radiation Oncology | Admitting: Radiation Oncology

## 2019-04-21 ENCOUNTER — Other Ambulatory Visit: Payer: Self-pay

## 2019-04-21 ENCOUNTER — Inpatient Hospital Stay: Payer: Medicare Other

## 2019-04-21 DIAGNOSIS — C541 Malignant neoplasm of endometrium: Secondary | ICD-10-CM

## 2019-04-21 DIAGNOSIS — R530 Neoplastic (malignant) related fatigue: Secondary | ICD-10-CM | POA: Diagnosis not present

## 2019-04-21 DIAGNOSIS — Z510 Encounter for antineoplastic radiation therapy: Secondary | ICD-10-CM | POA: Diagnosis not present

## 2019-04-21 LAB — CBC
HCT: 32.1 % — ABNORMAL LOW (ref 36.0–46.0)
Hemoglobin: 10.8 g/dL — ABNORMAL LOW (ref 12.0–15.0)
MCH: 32.6 pg (ref 26.0–34.0)
MCHC: 33.6 g/dL (ref 30.0–36.0)
MCV: 97 fL (ref 80.0–100.0)
Platelets: 178 10*3/uL (ref 150–400)
RBC: 3.31 MIL/uL — ABNORMAL LOW (ref 3.87–5.11)
RDW: 13.2 % (ref 11.5–15.5)
WBC: 2.6 10*3/uL — ABNORMAL LOW (ref 4.0–10.5)
nRBC: 0 % (ref 0.0–0.2)

## 2019-04-22 ENCOUNTER — Other Ambulatory Visit: Payer: Self-pay

## 2019-04-22 ENCOUNTER — Ambulatory Visit
Admission: RE | Admit: 2019-04-22 | Discharge: 2019-04-22 | Disposition: A | Discharge status: Home / Self Care | Payer: Medicare Other | Source: Ambulatory Visit | Attending: Radiation Oncology | Admitting: Radiation Oncology

## 2019-04-22 DIAGNOSIS — Z510 Encounter for antineoplastic radiation therapy: Secondary | ICD-10-CM | POA: Diagnosis not present

## 2019-04-25 ENCOUNTER — Ambulatory Visit
Admission: RE | Admit: 2019-04-25 | Discharge: 2019-04-25 | Disposition: A | Discharge status: Home / Self Care | Payer: Medicare Other | Source: Ambulatory Visit | Attending: Radiation Oncology | Admitting: Radiation Oncology

## 2019-04-25 ENCOUNTER — Other Ambulatory Visit: Payer: Self-pay

## 2019-04-25 DIAGNOSIS — Z510 Encounter for antineoplastic radiation therapy: Secondary | ICD-10-CM | POA: Diagnosis not present

## 2019-04-26 ENCOUNTER — Other Ambulatory Visit: Payer: Self-pay

## 2019-04-26 ENCOUNTER — Ambulatory Visit
Admission: RE | Admit: 2019-04-26 | Discharge: 2019-04-26 | Disposition: A | Discharge status: Home / Self Care | Payer: Medicare Other | Source: Ambulatory Visit | Attending: Radiation Oncology | Admitting: Radiation Oncology

## 2019-04-26 DIAGNOSIS — Z510 Encounter for antineoplastic radiation therapy: Secondary | ICD-10-CM | POA: Diagnosis not present

## 2019-04-27 ENCOUNTER — Other Ambulatory Visit: Payer: Self-pay

## 2019-04-27 ENCOUNTER — Ambulatory Visit
Admission: RE | Admit: 2019-04-27 | Discharge: 2019-04-27 | Disposition: A | Discharge status: Home / Self Care | Payer: Medicare Other | Source: Ambulatory Visit | Attending: Radiation Oncology | Admitting: Radiation Oncology

## 2019-04-27 DIAGNOSIS — Z510 Encounter for antineoplastic radiation therapy: Secondary | ICD-10-CM | POA: Diagnosis not present

## 2019-04-28 ENCOUNTER — Inpatient Hospital Stay: Payer: Medicare Other

## 2019-04-28 ENCOUNTER — Ambulatory Visit
Admission: RE | Admit: 2019-04-28 | Discharge: 2019-04-28 | Disposition: A | Discharge status: Home / Self Care | Payer: Medicare Other | Source: Ambulatory Visit | Attending: Radiation Oncology | Admitting: Radiation Oncology

## 2019-04-28 ENCOUNTER — Other Ambulatory Visit: Payer: Self-pay

## 2019-04-28 DIAGNOSIS — Z510 Encounter for antineoplastic radiation therapy: Secondary | ICD-10-CM | POA: Diagnosis not present

## 2019-04-28 DIAGNOSIS — R530 Neoplastic (malignant) related fatigue: Secondary | ICD-10-CM | POA: Diagnosis not present

## 2019-04-28 DIAGNOSIS — C541 Malignant neoplasm of endometrium: Secondary | ICD-10-CM

## 2019-04-28 LAB — CBC
HCT: 33.2 % — ABNORMAL LOW (ref 36.0–46.0)
Hemoglobin: 11 g/dL — ABNORMAL LOW (ref 12.0–15.0)
MCH: 32.3 pg (ref 26.0–34.0)
MCHC: 33.1 g/dL (ref 30.0–36.0)
MCV: 97.4 fL (ref 80.0–100.0)
Platelets: 196 10*3/uL (ref 150–400)
RBC: 3.41 MIL/uL — ABNORMAL LOW (ref 3.87–5.11)
RDW: 13.2 % (ref 11.5–15.5)
WBC: 3 10*3/uL — ABNORMAL LOW (ref 4.0–10.5)
nRBC: 0 % (ref 0.0–0.2)

## 2019-04-29 ENCOUNTER — Ambulatory Visit
Admission: RE | Admit: 2019-04-29 | Discharge: 2019-04-29 | Disposition: A | Discharge status: Home / Self Care | Payer: Medicare Other | Source: Ambulatory Visit | Attending: Radiation Oncology | Admitting: Radiation Oncology

## 2019-04-29 ENCOUNTER — Other Ambulatory Visit: Payer: Self-pay

## 2019-04-29 DIAGNOSIS — C541 Malignant neoplasm of endometrium: Secondary | ICD-10-CM | POA: Insufficient documentation

## 2019-04-29 DIAGNOSIS — Z510 Encounter for antineoplastic radiation therapy: Secondary | ICD-10-CM | POA: Diagnosis not present

## 2019-05-02 ENCOUNTER — Other Ambulatory Visit: Payer: Self-pay

## 2019-05-02 ENCOUNTER — Ambulatory Visit
Admission: RE | Admit: 2019-05-02 | Discharge: 2019-05-02 | Disposition: A | Discharge status: Home / Self Care | Payer: Medicare Other | Source: Ambulatory Visit | Attending: Radiation Oncology | Admitting: Radiation Oncology

## 2019-05-02 DIAGNOSIS — Z510 Encounter for antineoplastic radiation therapy: Secondary | ICD-10-CM | POA: Diagnosis not present

## 2019-05-03 ENCOUNTER — Other Ambulatory Visit: Payer: Self-pay

## 2019-05-03 ENCOUNTER — Ambulatory Visit
Admission: RE | Admit: 2019-05-03 | Discharge: 2019-05-03 | Disposition: A | Discharge status: Home / Self Care | Payer: Medicare Other | Source: Ambulatory Visit | Attending: Radiation Oncology | Admitting: Radiation Oncology

## 2019-05-03 DIAGNOSIS — Z510 Encounter for antineoplastic radiation therapy: Secondary | ICD-10-CM | POA: Diagnosis not present

## 2019-05-04 ENCOUNTER — Other Ambulatory Visit: Payer: Self-pay

## 2019-05-04 ENCOUNTER — Ambulatory Visit
Admission: RE | Admit: 2019-05-04 | Discharge: 2019-05-04 | Disposition: A | Discharge status: Home / Self Care | Payer: Medicare Other | Source: Ambulatory Visit | Attending: Radiation Oncology | Admitting: Radiation Oncology

## 2019-05-04 DIAGNOSIS — Z510 Encounter for antineoplastic radiation therapy: Secondary | ICD-10-CM | POA: Diagnosis not present

## 2019-05-05 ENCOUNTER — Ambulatory Visit
Admission: RE | Admit: 2019-05-05 | Discharge: 2019-05-05 | Disposition: A | Discharge status: Home / Self Care | Payer: Medicare Other | Source: Ambulatory Visit | Attending: Radiation Oncology | Admitting: Radiation Oncology

## 2019-05-05 ENCOUNTER — Other Ambulatory Visit: Payer: Self-pay

## 2019-05-05 ENCOUNTER — Inpatient Hospital Stay: Payer: Medicare Other | Attending: Radiation Oncology

## 2019-05-05 DIAGNOSIS — C541 Malignant neoplasm of endometrium: Secondary | ICD-10-CM | POA: Insufficient documentation

## 2019-05-05 DIAGNOSIS — Z510 Encounter for antineoplastic radiation therapy: Secondary | ICD-10-CM | POA: Diagnosis not present

## 2019-05-05 LAB — CBC
HCT: 33.1 % — ABNORMAL LOW (ref 36.0–46.0)
Hemoglobin: 11.1 g/dL — ABNORMAL LOW (ref 12.0–15.0)
MCH: 32.3 pg (ref 26.0–34.0)
MCHC: 33.5 g/dL (ref 30.0–36.0)
MCV: 96.2 fL (ref 80.0–100.0)
Platelets: 202 10*3/uL (ref 150–400)
RBC: 3.44 MIL/uL — ABNORMAL LOW (ref 3.87–5.11)
RDW: 12.8 % (ref 11.5–15.5)
WBC: 2.9 10*3/uL — ABNORMAL LOW (ref 4.0–10.5)
nRBC: 0 % (ref 0.0–0.2)

## 2019-05-06 ENCOUNTER — Ambulatory Visit
Admission: RE | Admit: 2019-05-06 | Discharge: 2019-05-06 | Disposition: A | Discharge status: Home / Self Care | Payer: Medicare Other | Source: Ambulatory Visit | Attending: Radiation Oncology | Admitting: Radiation Oncology

## 2019-05-06 ENCOUNTER — Other Ambulatory Visit: Payer: Self-pay

## 2019-05-06 DIAGNOSIS — Z510 Encounter for antineoplastic radiation therapy: Secondary | ICD-10-CM | POA: Diagnosis not present

## 2019-05-07 ENCOUNTER — Other Ambulatory Visit: Payer: Self-pay | Admitting: Family Medicine

## 2019-05-07 DIAGNOSIS — I10 Essential (primary) hypertension: Secondary | ICD-10-CM

## 2019-05-07 DIAGNOSIS — G620 Drug-induced polyneuropathy: Secondary | ICD-10-CM

## 2019-05-07 DIAGNOSIS — T451X5A Adverse effect of antineoplastic and immunosuppressive drugs, initial encounter: Secondary | ICD-10-CM

## 2019-05-09 ENCOUNTER — Other Ambulatory Visit: Payer: Self-pay

## 2019-05-09 ENCOUNTER — Ambulatory Visit
Admission: RE | Admit: 2019-05-09 | Discharge: 2019-05-09 | Disposition: A | Discharge status: Home / Self Care | Payer: Medicare Other | Source: Ambulatory Visit | Attending: Radiation Oncology | Admitting: Radiation Oncology

## 2019-05-09 DIAGNOSIS — Z510 Encounter for antineoplastic radiation therapy: Secondary | ICD-10-CM | POA: Diagnosis not present

## 2019-05-10 ENCOUNTER — Other Ambulatory Visit: Payer: Self-pay

## 2019-05-10 ENCOUNTER — Ambulatory Visit
Admission: RE | Admit: 2019-05-10 | Discharge: 2019-05-10 | Disposition: A | Discharge status: Home / Self Care | Payer: Medicare Other | Source: Ambulatory Visit | Attending: Radiation Oncology | Admitting: Radiation Oncology

## 2019-05-10 ENCOUNTER — Other Ambulatory Visit: Payer: Self-pay | Admitting: *Deleted

## 2019-05-10 DIAGNOSIS — Z510 Encounter for antineoplastic radiation therapy: Secondary | ICD-10-CM | POA: Diagnosis not present

## 2019-05-10 MED ORDER — FLUCONAZOLE 200 MG PO TABS: 200.0000 mg | ORAL_TABLET | Freq: Once | ORAL | 0 refills | Status: AC

## 2019-05-11 ENCOUNTER — Ambulatory Visit
Admission: RE | Admit: 2019-05-11 | Discharge: 2019-05-11 | Disposition: A | Discharge status: Home / Self Care | Payer: Medicare Other | Source: Ambulatory Visit | Attending: Radiation Oncology | Admitting: Radiation Oncology

## 2019-05-11 ENCOUNTER — Other Ambulatory Visit: Payer: Self-pay

## 2019-05-11 ENCOUNTER — Ambulatory Visit: Payer: Medicare Other

## 2019-05-11 DIAGNOSIS — Z510 Encounter for antineoplastic radiation therapy: Secondary | ICD-10-CM | POA: Diagnosis not present

## 2019-05-12 ENCOUNTER — Other Ambulatory Visit: Payer: Self-pay

## 2019-05-12 ENCOUNTER — Ambulatory Visit
Admission: RE | Admit: 2019-05-12 | Discharge: 2019-05-12 | Disposition: A | Discharge status: Home / Self Care | Payer: Medicare Other | Source: Ambulatory Visit | Attending: Radiation Oncology | Admitting: Radiation Oncology

## 2019-05-12 ENCOUNTER — Inpatient Hospital Stay: Payer: Medicare Other

## 2019-05-12 DIAGNOSIS — C541 Malignant neoplasm of endometrium: Secondary | ICD-10-CM

## 2019-05-12 DIAGNOSIS — Z510 Encounter for antineoplastic radiation therapy: Secondary | ICD-10-CM | POA: Diagnosis not present

## 2019-05-12 LAB — CBC
HCT: 33.5 % — ABNORMAL LOW (ref 36.0–46.0)
Hemoglobin: 11.2 g/dL — ABNORMAL LOW (ref 12.0–15.0)
MCH: 32 pg (ref 26.0–34.0)
MCHC: 33.4 g/dL (ref 30.0–36.0)
MCV: 95.7 fL (ref 80.0–100.0)
Platelets: 195 10*3/uL (ref 150–400)
RBC: 3.5 MIL/uL — ABNORMAL LOW (ref 3.87–5.11)
RDW: 12.6 % (ref 11.5–15.5)
WBC: 3.3 10*3/uL — ABNORMAL LOW (ref 4.0–10.5)
nRBC: 0 % (ref 0.0–0.2)

## 2019-05-13 ENCOUNTER — Encounter: Payer: Self-pay | Admitting: Family Medicine

## 2019-05-13 ENCOUNTER — Ambulatory Visit
Admission: RE | Admit: 2019-05-13 | Discharge: 2019-05-13 | Disposition: A | Discharge status: Home / Self Care | Payer: Medicare Other | Source: Ambulatory Visit | Attending: Radiation Oncology | Admitting: Radiation Oncology

## 2019-05-13 ENCOUNTER — Ambulatory Visit (INDEPENDENT_AMBULATORY_CARE_PROVIDER_SITE_OTHER): Payer: Medicare Other | Admitting: Family Medicine

## 2019-05-13 ENCOUNTER — Other Ambulatory Visit: Payer: Self-pay

## 2019-05-13 VITALS — BP 134/78 | Wt 222.7 lb

## 2019-05-13 DIAGNOSIS — I43 Cardiomyopathy in diseases classified elsewhere: Secondary | ICD-10-CM

## 2019-05-13 DIAGNOSIS — Z510 Encounter for antineoplastic radiation therapy: Secondary | ICD-10-CM | POA: Diagnosis not present

## 2019-05-13 DIAGNOSIS — I10 Essential (primary) hypertension: Secondary | ICD-10-CM | POA: Diagnosis not present

## 2019-05-13 DIAGNOSIS — I700 Atherosclerosis of aorta: Secondary | ICD-10-CM

## 2019-05-13 DIAGNOSIS — G620 Drug-induced polyneuropathy: Secondary | ICD-10-CM

## 2019-05-13 DIAGNOSIS — E785 Hyperlipidemia, unspecified: Secondary | ICD-10-CM

## 2019-05-13 DIAGNOSIS — C541 Malignant neoplasm of endometrium: Secondary | ICD-10-CM

## 2019-05-13 DIAGNOSIS — F330 Major depressive disorder, recurrent, mild: Secondary | ICD-10-CM | POA: Diagnosis not present

## 2019-05-13 DIAGNOSIS — R7303 Prediabetes: Secondary | ICD-10-CM

## 2019-05-13 DIAGNOSIS — J4520 Mild intermittent asthma, uncomplicated: Secondary | ICD-10-CM

## 2019-05-13 DIAGNOSIS — C775 Secondary and unspecified malignant neoplasm of intrapelvic lymph nodes: Secondary | ICD-10-CM

## 2019-05-13 DIAGNOSIS — I119 Hypertensive heart disease without heart failure: Secondary | ICD-10-CM

## 2019-05-13 DIAGNOSIS — T451X5A Adverse effect of antineoplastic and immunosuppressive drugs, initial encounter: Secondary | ICD-10-CM

## 2019-05-13 MED ORDER — ESCITALOPRAM OXALATE 10 MG PO TABS: 10.0000 mg | ORAL_TABLET | Freq: Every day | ORAL | 2 refills | Status: DC

## 2019-05-13 MED ORDER — PREGABALIN 75 MG PO CAPS: 75.0000 mg | ORAL_CAPSULE | Freq: Two times a day (BID) | ORAL | 2 refills | Status: DC

## 2019-05-13 MED ORDER — EZETIMIBE 10 MG PO TABS: 10.0000 mg | ORAL_TABLET | Freq: Every day | ORAL | 2 refills | Status: DC

## 2019-05-13 NOTE — Progress Notes (Signed)
Name: Molly [name]   MRN: [id number]    DOB: 09-Feb-1951   Date:05/13/2019       Progress Note  Subjective  Chief Complaint  Chief Complaint  Patient presents with  . Follow-up    I connected with  Molly [name]  on 93/26/71 at  1:40 PM EDT by a video enabled telemedicine application and verified that I am speaking with the correct person using two identifiers.  I discussed the limitations of evaluation and management by telemedicine and the availability of in person appointments. The patient expressed understanding and agreed to proceed. Staff also discussed with the patient that there may be a patient responsible charge related to this service. Patient Location: at a venue - looking to purchase it  Provider Location: [facility]   HPI  Asthma: mild intermittent, no cough , sob or wheezing   Major Depression: doing better emotionally with lexapro, dose is working, no crying spells right now, feels welcomed at the cancer center, she states her relationship with her dad has improved, at peace at home  Cardiomyopathy without IWP:YKDX on lasix, no orthopnea, or chest pain, lower extremity edema slightly present today, but only taking medication prn now and doing well. Unchanged  Endometrial cancer: s/p hysterectomy, finished the chemo and is now having radiation therapy with Dr. Baruch Gouty . She has metastasis to local lymphonodi. She is getting nausea and loose stools, no vomiting  Morbid obesity: off victoza because of episode of pancreatitis, also has prediabetes,she was on metformin but we stopped medication since diagnosed with endometrial cancer. She states nausea is affecting her appetite but weight has been stable.   Dyslipidemia: cannot tolerate statin therapy, but is taking Zetia. Reviewed last labs   HTN: bp is at goal, no chest pain, or palpitation, doing well at this time  OA: she states osteoarthritis is under control, she is not in pain, very  seldom uses topical medication on her knee but doing well at this time  Chemo induced neuropathy: Started a couple of weeks after her last treatment.  She is now on Lyrica and pain has improved, much better on her hands, still has some shooting pain on feet when she has showed on only. Numbness is still present and bothersome   Atherosclerosis aorta: on zetia, because she cannot tolerate statin therapy , she denies side effects of medication  Patient Active Problem List   Diagnosis Date Noted  . 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 Immunosuppressed status ([place]) 01/11/2019  . Iron deficiency anemia 11/15/2018  . Endometrial adenocarcinoma ([place]) 10/29/2018  . Umbilical hernia without obstruction and without gangrene   . Atherosclerosis of right coronary artery 11/10/2017  . Atherosclerosis of abdominal aorta ([place]) 11/10/2017  . Prediabetes 10/02/2017  . Pain in limb 03/31/2017  . Perennial allergic rhinitis with seasonal variation 05/14/2016  . Asthma, mild intermittent, well-controlled 05/14/2016  . Hypertension goal BP (blood pressure) < 140/90 12/10/2015  . Cardiomyopathy due to hypertension ([place]) 12/10/2015  . History of CVA (cerebrovascular accident) 12/10/2015  . Hyperlipidemia LDL goal <70 12/10/2015  . Statin intolerance 12/10/2015    Past Surgical History:  Procedure Laterality Date  . ABDOMINAL HYSTERECTOMY    . CHOLECYSTECTOMY    . COLONOSCOPY WITH PROPOFOL N/A 01/08/2017   Procedure: COLONOSCOPY WITH PROPOFOL;  Surgeon: [name], MD;  Location: [facility] ENDOSCOPY;  Service: Endoscopy;  Laterality: N/A;  . COLONOSCOPY WITH PROPOFOL N/A 05/05/2018   Procedure:  COLONOSCOPY WITH PROPOFOL;  Surgeon: [name], MD;  Location: [facility] ENDOSCOPY;  Service: Gastroenterology;  Laterality: N/A;  . HERNIA REPAIR  16/1096   umbilical  . PORTACATH PLACEMENT N/A 11/04/2018   Procedure: INSERTION PORT-A-CATH;  Surgeon: Jules Husbands, MD;  Location: [facility] ORS;  Service: General;  Laterality: N/A;  . SENTINEL NODE BIOPSY N/A 10/06/2018   Procedure: SENTINEL NODE BIOPSY;  Surgeon: Mellody Drown, MD;  Location: [facility] ORS;  Service: Gynecology;  Laterality: N/A;  . UMBILICAL HERNIA REPAIR N/A 11/17/2017   Procedure: HERNIA REPAIR UMBILICAL ADULT;  Surgeon: Jules Husbands, MD;  Location: [facility] ORS;  Service: General;  Laterality: N/A;    Family History  Problem Relation Age of Onset  . Stroke Mother   . Hypertension Mother   . Dementia Mother   . Alzheimer's disease Mother   . Gout Father   . Asthma Father   . Hypertension Father   . Dementia Father   . Healthy Sister   . Stroke Brother   . Alzheimer's disease Brother   . Healthy Daughter   . Hypertension Brother   . Healthy Brother   . Cancer Paternal Grandmother   . Healthy Sister   . Healthy Sister   . Hypertension Sister   . Hypertension Sister   . Stroke Brother   . Alzheimer's disease Brother   . Stroke Brother   . Hypertension Brother   . Stroke Brother   . Hypertension Brother   . Healthy Brother   . Healthy Brother   . Hypertension Daughter   . Breast cancer Neg Hx     Social History   Socioeconomic History  . Marital status: Divorced    Spouse name: Not on file  . Number of children: 2  . Years of education: some college  . Highest education level: 12th grade  Occupational History  . 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 Occupation: currently a Careers adviser  . Financial resource strain: Not hard at all  . Food insecurity:    Worry: Never true    Inability: Never true  . Transportation needs:    Medical: No    Non-medical: No  Tobacco Use  . Smoking status: Never Smoker  . Smokeless tobacco: Never Used  . Tobacco comment: smoking cessation materials not required  Substance and Sexual Activity  . Alcohol use: No    Alcohol/week: 0.0 standard drinks  . Drug use: No  . Sexual activity: Not  Currently  Lifestyle  . Physical activity:    Days per week: 1 day    Minutes per session: 20 min  . Stress: Not at all  Relationships  . Social connections:    Talks on phone: More than three times a week    Gets together: Three times a week    Attends religious service: More than 4 times per year    Active member of club or organization: Yes    Attends meetings of clubs or organizations: More than 4 times per year    Relationship status: Divorced  . Intimate partner violence:    Fear of current or ex partner: No    Emotionally abused: No    Physically abused: No    Forced sexual activity: No  Other Topics Concern  . Not on file  Social History Narrative  . Not on file     Current Outpatient Medications:  .  acetaminophen (TYLENOL) 500 MG  tablet, Take 2 tablets (1,000 mg total) by mouth every 6 (six) hours as needed for mild pain or moderate pain., Disp: 30 tablet, Rfl: 0 .  albuterol (PROVENTIL HFA;VENTOLIN HFA) 108 (90 Base) MCG/ACT inhaler, Inhale 2 puffs into the lungs every 4 (four) hours as needed for wheezing or shortness of breath., Disp: 1 Inhaler, Rfl: 0 .  escitalopram (LEXAPRO) 10 MG tablet, Take 1 tablet (10 mg total) by mouth daily., Disp: 30 tablet, Rfl: 2 .  ezetimibe (ZETIA) 10 MG tablet, Take 1 tablet (10 mg total) by mouth daily., Disp: 30 tablet, Rfl: 2 .  ketoconazole (NIZORAL) 2 % cream, Apply 1 application topically daily as needed for irritation. On abdominal fold prn, Disp: 60 g, Rfl: 0 .  lidocaine-prilocaine (EMLA) cream, Apply to affected area once, Disp: 30 g, Rfl: 3 .  loperamide (IMODIUM) 2 MG capsule, Take 2 mg by mouth as needed for diarrhea or loose stools., Disp: , Rfl:  .  loratadine (CLARITIN) 10 MG tablet, Take 1 tablet by mouth daily., Disp: 30 tablet, Rfl: 5 .  magnesium oxide (MAG-OX) 400 (241.3 Mg) MG tablet, Take 1 tablet (400 mg total) by mouth daily. For hypomagnesemia, Disp: 30 tablet, Rfl: 0 .  olmesartan-hydrochlorothiazide (BENICAR  HCT) 20-12.5 MG tablet, Take 1 tablet by mouth daily., Disp: 90 tablet, Rfl: 0 .  omeprazole (PRILOSEC OTC) 20 MG tablet, Take 20 mg by mouth daily as needed., Disp: , Rfl:  .  ondansetron (ZOFRAN) 8 MG tablet, Take 1 tablet (8 mg total) by mouth 2 (two) times daily as needed for refractory nausea / vomiting. Start on day 3 after chemo., Disp: 30 tablet, Rfl: 1 .  Potassium 99 MG TABS, Take 1 tablet by mouth daily., Disp: , Rfl:  .  pregabalin (LYRICA) 75 MG capsule, Take 1 capsule (75 mg total) by mouth 2 (two) times daily., Disp: 60 capsule, Rfl: 2 .  prochlorperazine (COMPAZINE) 10 MG tablet, Take 10 mg by mouth every 6 (six) hours as needed for nausea or vomiting., Disp: , Rfl:  .  triamcinolone ointment (KENALOG) 0.5 %, Apply 1 application topically 2 (two) times daily., Disp: 30 g, Rfl: 0 .  furosemide (LASIX) 40 MG tablet, Take 1 tablet (40 mg total) by mouth daily as needed for fluid. (Patient not taking: Reported on 05/13/2019), Disp: 90 tablet, Rfl: 0 No current facility-administered medications for this visit.   Facility-Administered Medications Ordered in Other Visits:  .  sodium chloride flush (NS) 0.9 % injection 10 mL, 10 mL, Intravenous, PRN, Sindy Guadeloupe, MD, 10 mL at 11/15/18 0915 .  sodium chloride flush (NS) 0.9 % injection 10 mL, 10 mL, Intracatheter, Once PRN, Sindy Guadeloupe, MD  Allergies  Allergen Reactions  . Ferumoxytol Anaphylaxis  . Nsaids Hives and Rash  . Statins Other (See Comments)    Joint pains  . Victoza [Liraglutide] Other (See Comments)    pancreatitis  . Oxycodone Nausea And Vomiting  . Crestor [Rosuvastatin Calcium] Rash    I personally reviewed active problem list, medication list, allergies, family history with the patient/caregiver today.   ROS  Ten systems reviewed and is negative except as mentioned in HPI   Objective  Virtual encounter, vitals not obtained here only at home   Body mass index is 44.97 kg/m.  Physical Exam  Awake,  alert and oriented and in no distress, smiling   Results for orders placed or performed in visit on 05/12/19 (from the past 72 hour(s))  CBC  Status: Abnormal   Collection Time: 05/12/19  8:43 AM  Result Value Ref Range   WBC 3.3 (L) 4.0 - 10.5 K/uL   RBC 3.50 (L) 3.87 - 5.11 MIL/uL   Hemoglobin 11.2 (L) 12.0 - 15.0 g/dL   HCT 33.5 (L) 36.0 - 46.0 %   MCV 95.7 80.0 - 100.0 fL   MCH 32.0 26.0 - 34.0 pg   MCHC 33.4 30.0 - 36.0 g/dL   RDW 12.6 11.5 - 15.5 %   Platelets 195 150 - 400 K/uL   nRBC 0.0 0.0 - 0.2 %    Comment: Performed at [facility], [address] [place], [place] [address]    PHQ2/9: Depression screen [facility] 2/9 05/13/2019 04/07/2019 11/19/2018 10/20/2018 09/07/2018  Decreased Interest 0 0 0 0 1  Down, Depressed, Hopeless 0 0 0 0 1  PHQ - 2 Score 0 0 0 0 2  Altered sleeping 0 - 0 0 1  Tired, decreased energy 0 - 0 0 2  Change in appetite 0 - 0 0 2  Feeling bad or failure about yourself  0 - 0 0 0  Trouble concentrating 0 - 0 0 1  Moving slowly or fidgety/restless 0 - 0 0 0  Suicidal thoughts 0 - 0 0 0  PHQ-9 Score 0 - 0 0 8  Difficult doing work/chores - - Not difficult at all Not difficult at all Somewhat difficult  Some recent data might be hidden   PHQ-2/9 Result is negative.    Fall Risk: Fall Risk  05/13/2019 04/07/2019 02/25/2019 01/14/2019 01/11/2019  Falls in the past year? 0 0 0 0 0  Number falls in past yr: 0 0 - - 0  Injury with Fall? 0 0 - - 0  Risk for fall due to : - - - - -  Risk for fall due to: Comment - - - - -  Follow up - Falls prevention discussed - - -     Assessment & Plan  1. Mild recurrent major depression (HCC)  - escitalopram (LEXAPRO) 10 MG tablet; Take 1 tablet (10 mg total) by mouth daily.  Dispense: 30 tablet; Refill: 2  2. Hyperlipidemia LDL goal <70  - ezetimibe (ZETIA) 10 MG tablet; Take 1 tablet (10 mg total) by mouth daily.  Dispense: 30 tablet; Refill: 2  3. Hypertension goal BP (blood pressure) < 140/90    4. Atherosclerosis of abdominal aorta (HCC)  - ezetimibe (ZETIA) 10 MG tablet; Take 1 tablet (10 mg total) by mouth daily.  Dispense: 30 tablet; Refill: 2  5. Mild intermittent asthma without complication  Doing well at this time  6. Morbid obesity ([place])  Not on therapy since being treated for cancer  7. Prediabetes   8. Cardiomyopathy due to hypertension, without heart failure ([place])   9. Endometrial carcinoma ([place])  Under the care of radiation oncologist and oncologist   10. 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 Chemotherapy-induced neuropathy (HCC)  We will increase dose of lyrica - pregabalin (LYRICA) 75 MG capsule; Take 1 capsule (75 mg total) by mouth 2 (two) times daily.  Dispense: 60 capsule; Refill: 2  I discussed the assessment and treatment plan with the patient. The patient was provided an opportunity to ask questions and all were answered. The patient agreed with the plan and demonstrated an understanding of the instructions.  The patient was advised to call back or seek an in-person evaluation if the symptoms worsen or if the  condition fails to improve as anticipated.  I provided 23minutes of non-face-to-face time during this encounter.

## 2019-05-16 ENCOUNTER — Other Ambulatory Visit: Payer: Self-pay

## 2019-05-16 ENCOUNTER — Ambulatory Visit
Admission: RE | Admit: 2019-05-16 | Discharge: 2019-05-16 | Disposition: A | Discharge status: Home / Self Care | Payer: Medicare Other | Source: Ambulatory Visit | Attending: Radiation Oncology | Admitting: Radiation Oncology

## 2019-05-16 DIAGNOSIS — Z510 Encounter for antineoplastic radiation therapy: Secondary | ICD-10-CM | POA: Diagnosis not present

## 2019-05-17 ENCOUNTER — Ambulatory Visit
Admission: RE | Admit: 2019-05-17 | Discharge: 2019-05-17 | Disposition: A | Discharge status: Home / Self Care | Payer: Medicare Other | Source: Ambulatory Visit | Attending: Radiation Oncology | Admitting: Radiation Oncology

## 2019-05-17 ENCOUNTER — Other Ambulatory Visit: Payer: Self-pay

## 2019-05-17 DIAGNOSIS — Z510 Encounter for antineoplastic radiation therapy: Secondary | ICD-10-CM | POA: Diagnosis not present

## 2019-05-18 ENCOUNTER — Ambulatory Visit
Admission: RE | Admit: 2019-05-18 | Discharge: 2019-05-18 | Disposition: A | Discharge status: Home / Self Care | Payer: Medicare Other | Source: Ambulatory Visit | Attending: Radiation Oncology | Admitting: Radiation Oncology

## 2019-05-18 ENCOUNTER — Other Ambulatory Visit: Payer: Self-pay

## 2019-05-18 DIAGNOSIS — Z510 Encounter for antineoplastic radiation therapy: Secondary | ICD-10-CM | POA: Diagnosis not present

## 2019-05-19 ENCOUNTER — Other Ambulatory Visit: Payer: Self-pay

## 2019-05-19 ENCOUNTER — Ambulatory Visit
Admission: RE | Admit: 2019-05-19 | Discharge: 2019-05-19 | Disposition: A | Discharge status: Home / Self Care | Payer: Medicare Other | Source: Ambulatory Visit | Attending: Radiation Oncology | Admitting: Radiation Oncology

## 2019-05-19 ENCOUNTER — Inpatient Hospital Stay: Payer: Medicare Other

## 2019-05-19 DIAGNOSIS — C541 Malignant neoplasm of endometrium: Secondary | ICD-10-CM | POA: Diagnosis not present

## 2019-05-19 DIAGNOSIS — Z510 Encounter for antineoplastic radiation therapy: Secondary | ICD-10-CM | POA: Diagnosis not present

## 2019-05-19 LAB — CBC WITH DIFFERENTIAL/PLATELET
Abs Immature Granulocytes: 0.01 10*3/uL (ref 0.00–0.07)
Basophils Absolute: 0 10*3/uL (ref 0.0–0.1)
Basophils Relative: 0 %
Eosinophils Absolute: 0 10*3/uL (ref 0.0–0.5)
Eosinophils Relative: 1 %
HCT: 33.2 % — ABNORMAL LOW (ref 36.0–46.0)
Hemoglobin: 11.2 g/dL — ABNORMAL LOW (ref 12.0–15.0)
Immature Granulocytes: 0 %
Lymphocytes Relative: 17 %
Lymphs Abs: 0.4 10*3/uL — ABNORMAL LOW (ref 0.7–4.0)
MCH: 32.3 pg (ref 26.0–34.0)
MCHC: 33.7 g/dL (ref 30.0–36.0)
MCV: 95.7 fL (ref 80.0–100.0)
Monocytes Absolute: 0.2 10*3/uL (ref 0.1–1.0)
Monocytes Relative: 10 %
Neutro Abs: 1.7 10*3/uL (ref 1.7–7.7)
Neutrophils Relative %: 72 %
Platelets: 188 10*3/uL (ref 150–400)
RBC: 3.47 MIL/uL — ABNORMAL LOW (ref 3.87–5.11)
RDW: 12.4 % (ref 11.5–15.5)
WBC: 2.4 10*3/uL — ABNORMAL LOW (ref 4.0–10.5)
nRBC: 0 % (ref 0.0–0.2)

## 2019-05-20 ENCOUNTER — Other Ambulatory Visit: Payer: Self-pay

## 2019-05-20 ENCOUNTER — Ambulatory Visit
Admission: RE | Admit: 2019-05-20 | Discharge: 2019-05-20 | Disposition: A | Discharge status: Home / Self Care | Payer: Medicare Other | Source: Ambulatory Visit | Attending: Radiation Oncology | Admitting: Radiation Oncology

## 2019-05-20 DIAGNOSIS — Z510 Encounter for antineoplastic radiation therapy: Secondary | ICD-10-CM | POA: Diagnosis not present

## 2019-05-24 ENCOUNTER — Other Ambulatory Visit: Payer: Self-pay

## 2019-05-24 ENCOUNTER — Ambulatory Visit
Admission: RE | Admit: 2019-05-24 | Discharge: 2019-05-24 | Disposition: A | Discharge status: Home / Self Care | Payer: Medicare Other | Source: Ambulatory Visit | Attending: Radiation Oncology | Admitting: Radiation Oncology

## 2019-05-24 DIAGNOSIS — Z510 Encounter for antineoplastic radiation therapy: Secondary | ICD-10-CM | POA: Diagnosis not present

## 2019-05-25 ENCOUNTER — Ambulatory Visit
Admission: RE | Admit: 2019-05-25 | Discharge: 2019-05-25 | Disposition: A | Discharge status: Home / Self Care | Payer: Medicare Other | Source: Ambulatory Visit | Attending: Radiation Oncology | Admitting: Radiation Oncology

## 2019-05-25 ENCOUNTER — Other Ambulatory Visit: Payer: Self-pay

## 2019-05-25 DIAGNOSIS — Z510 Encounter for antineoplastic radiation therapy: Secondary | ICD-10-CM | POA: Diagnosis not present

## 2019-05-26 ENCOUNTER — Other Ambulatory Visit: Payer: Self-pay

## 2019-05-26 ENCOUNTER — Inpatient Hospital Stay: Payer: Medicare Other

## 2019-05-26 ENCOUNTER — Ambulatory Visit
Admission: RE | Admit: 2019-05-26 | Discharge: 2019-05-26 | Disposition: A | Discharge status: Home / Self Care | Payer: Medicare Other | Source: Ambulatory Visit | Attending: Radiation Oncology | Admitting: Radiation Oncology

## 2019-05-26 DIAGNOSIS — Z510 Encounter for antineoplastic radiation therapy: Secondary | ICD-10-CM | POA: Diagnosis not present

## 2019-05-26 DIAGNOSIS — C541 Malignant neoplasm of endometrium: Secondary | ICD-10-CM

## 2019-05-26 LAB — CBC
HCT: 32.4 % — ABNORMAL LOW (ref 36.0–46.0)
Hemoglobin: 10.9 g/dL — ABNORMAL LOW (ref 12.0–15.0)
MCH: 32.3 pg (ref 26.0–34.0)
MCHC: 33.6 g/dL (ref 30.0–36.0)
MCV: 96.1 fL (ref 80.0–100.0)
Platelets: 186 10*3/uL (ref 150–400)
RBC: 3.37 MIL/uL — ABNORMAL LOW (ref 3.87–5.11)
RDW: 12.3 % (ref 11.5–15.5)
WBC: 2.8 10*3/uL — ABNORMAL LOW (ref 4.0–10.5)
nRBC: 0 % (ref 0.0–0.2)

## 2019-05-27 ENCOUNTER — Other Ambulatory Visit: Payer: Self-pay

## 2019-05-27 ENCOUNTER — Ambulatory Visit
Admission: RE | Admit: 2019-05-27 | Discharge: 2019-05-27 | Disposition: A | Discharge status: Home / Self Care | Payer: Medicare Other | Source: Ambulatory Visit | Attending: Radiation Oncology | Admitting: Radiation Oncology

## 2019-05-27 DIAGNOSIS — Z510 Encounter for antineoplastic radiation therapy: Secondary | ICD-10-CM | POA: Diagnosis not present

## 2019-05-30 ENCOUNTER — Other Ambulatory Visit: Payer: Self-pay

## 2019-05-30 ENCOUNTER — Ambulatory Visit
Admission: RE | Admit: 2019-05-30 | Discharge: 2019-05-30 | Disposition: A | Discharge status: Home / Self Care | Payer: Medicare Other | Source: Ambulatory Visit | Attending: Radiation Oncology | Admitting: Radiation Oncology

## 2019-05-30 DIAGNOSIS — C541 Malignant neoplasm of endometrium: Secondary | ICD-10-CM | POA: Insufficient documentation

## 2019-05-30 DIAGNOSIS — Z510 Encounter for antineoplastic radiation therapy: Secondary | ICD-10-CM | POA: Insufficient documentation

## 2019-05-31 ENCOUNTER — Other Ambulatory Visit: Payer: Self-pay

## 2019-05-31 ENCOUNTER — Ambulatory Visit
Admission: RE | Admit: 2019-05-31 | Discharge: 2019-05-31 | Disposition: A | Discharge status: Home / Self Care | Payer: Medicare Other | Source: Ambulatory Visit | Attending: Radiation Oncology | Admitting: Radiation Oncology

## 2019-05-31 DIAGNOSIS — Z510 Encounter for antineoplastic radiation therapy: Secondary | ICD-10-CM | POA: Diagnosis not present

## 2019-06-01 ENCOUNTER — Other Ambulatory Visit: Payer: Self-pay

## 2019-06-01 ENCOUNTER — Ambulatory Visit
Admission: RE | Admit: 2019-06-01 | Discharge: 2019-06-01 | Disposition: A | Discharge status: Home / Self Care | Payer: Medicare Other | Source: Ambulatory Visit | Attending: Radiation Oncology | Admitting: Radiation Oncology

## 2019-06-01 DIAGNOSIS — Z510 Encounter for antineoplastic radiation therapy: Secondary | ICD-10-CM | POA: Diagnosis not present

## 2019-06-02 ENCOUNTER — Other Ambulatory Visit: Payer: Self-pay

## 2019-06-02 ENCOUNTER — Ambulatory Visit: Admission: RE | Admit: 2019-06-02 | Payer: Medicare Other | Source: Ambulatory Visit

## 2019-06-02 ENCOUNTER — Inpatient Hospital Stay: Payer: Medicare Other | Attending: Radiation Oncology

## 2019-06-02 DIAGNOSIS — Z90710 Acquired absence of both cervix and uterus: Secondary | ICD-10-CM | POA: Insufficient documentation

## 2019-06-02 DIAGNOSIS — C541 Malignant neoplasm of endometrium: Secondary | ICD-10-CM

## 2019-06-02 DIAGNOSIS — Z90722 Acquired absence of ovaries, bilateral: Secondary | ICD-10-CM | POA: Insufficient documentation

## 2019-06-02 DIAGNOSIS — Z923 Personal history of irradiation: Secondary | ICD-10-CM | POA: Insufficient documentation

## 2019-06-02 DIAGNOSIS — Z9221 Personal history of antineoplastic chemotherapy: Secondary | ICD-10-CM | POA: Insufficient documentation

## 2019-06-02 LAB — CBC
HCT: 34.5 % — ABNORMAL LOW (ref 36.0–46.0)
Hemoglobin: 11.6 g/dL — ABNORMAL LOW (ref 12.0–15.0)
MCH: 32.1 pg (ref 26.0–34.0)
MCHC: 33.6 g/dL (ref 30.0–36.0)
MCV: 95.6 fL (ref 80.0–100.0)
Platelets: 204 10*3/uL (ref 150–400)
RBC: 3.61 MIL/uL — ABNORMAL LOW (ref 3.87–5.11)
RDW: 12.3 % (ref 11.5–15.5)
WBC: 3 10*3/uL — ABNORMAL LOW (ref 4.0–10.5)
nRBC: 0 % (ref 0.0–0.2)

## 2019-06-03 ENCOUNTER — Other Ambulatory Visit: Payer: Self-pay

## 2019-06-03 ENCOUNTER — Ambulatory Visit
Admission: RE | Admit: 2019-06-03 | Discharge: 2019-06-03 | Disposition: A | Discharge status: Home / Self Care | Payer: Medicare Other | Source: Ambulatory Visit | Attending: Radiation Oncology | Admitting: Radiation Oncology

## 2019-06-03 DIAGNOSIS — Z510 Encounter for antineoplastic radiation therapy: Secondary | ICD-10-CM | POA: Diagnosis not present

## 2019-06-06 ENCOUNTER — Ambulatory Visit
Admission: RE | Admit: 2019-06-06 | Discharge: 2019-06-06 | Disposition: A | Discharge status: Home / Self Care | Payer: Medicare Other | Source: Ambulatory Visit | Attending: Radiation Oncology | Admitting: Radiation Oncology

## 2019-06-06 ENCOUNTER — Other Ambulatory Visit: Payer: Self-pay | Admitting: Family Medicine

## 2019-06-06 ENCOUNTER — Other Ambulatory Visit: Payer: Self-pay

## 2019-06-06 DIAGNOSIS — R600 Localized edema: Secondary | ICD-10-CM

## 2019-06-06 DIAGNOSIS — Z510 Encounter for antineoplastic radiation therapy: Secondary | ICD-10-CM | POA: Diagnosis not present

## 2019-06-06 DIAGNOSIS — I10 Essential (primary) hypertension: Secondary | ICD-10-CM

## 2019-06-06 DIAGNOSIS — I119 Hypertensive heart disease without heart failure: Secondary | ICD-10-CM

## 2019-06-07 ENCOUNTER — Other Ambulatory Visit: Payer: Self-pay

## 2019-06-07 ENCOUNTER — Ambulatory Visit
Admission: RE | Admit: 2019-06-07 | Discharge: 2019-06-07 | Disposition: A | Discharge status: Home / Self Care | Payer: Medicare Other | Source: Ambulatory Visit | Attending: Radiation Oncology | Admitting: Radiation Oncology

## 2019-06-07 DIAGNOSIS — Z510 Encounter for antineoplastic radiation therapy: Secondary | ICD-10-CM | POA: Diagnosis not present

## 2019-06-08 ENCOUNTER — Telehealth: Payer: Self-pay | Admitting: Radiation Oncology

## 2019-06-08 ENCOUNTER — Ambulatory Visit
Admission: RE | Admit: 2019-06-08 | Discharge: 2019-06-08 | Disposition: A | Discharge status: Home / Self Care | Payer: Medicare Other | Source: Ambulatory Visit | Attending: Radiation Oncology | Admitting: Radiation Oncology

## 2019-06-08 ENCOUNTER — Other Ambulatory Visit: Payer: Self-pay

## 2019-06-08 DIAGNOSIS — Z510 Encounter for antineoplastic radiation therapy: Secondary | ICD-10-CM | POA: Diagnosis not present

## 2019-06-08 NOTE — Telephone Encounter (Signed)
Spoke to pt and completed travel screen. Also explained about addl screening questions they will be asked, new guidelines about mask req, no visitors, and fever checks °

## 2019-06-09 ENCOUNTER — Ambulatory Visit
Admission: RE | Admit: 2019-06-09 | Discharge: 2019-06-09 | Disposition: A | Discharge status: Home / Self Care | Payer: Medicare Other | Source: Ambulatory Visit | Attending: Radiation Oncology | Admitting: Radiation Oncology

## 2019-06-09 ENCOUNTER — Other Ambulatory Visit: Payer: Self-pay

## 2019-06-09 ENCOUNTER — Inpatient Hospital Stay: Payer: Medicare Other

## 2019-06-09 DIAGNOSIS — C541 Malignant neoplasm of endometrium: Secondary | ICD-10-CM

## 2019-06-09 DIAGNOSIS — Z923 Personal history of irradiation: Secondary | ICD-10-CM | POA: Diagnosis not present

## 2019-06-09 DIAGNOSIS — Z9221 Personal history of antineoplastic chemotherapy: Secondary | ICD-10-CM | POA: Diagnosis not present

## 2019-06-09 DIAGNOSIS — Z510 Encounter for antineoplastic radiation therapy: Secondary | ICD-10-CM | POA: Diagnosis not present

## 2019-06-09 LAB — CBC
HCT: 32.2 % — ABNORMAL LOW (ref 36.0–46.0)
Hemoglobin: 10.8 g/dL — ABNORMAL LOW (ref 12.0–15.0)
MCH: 32 pg (ref 26.0–34.0)
MCHC: 33.5 g/dL (ref 30.0–36.0)
MCV: 95.3 fL (ref 80.0–100.0)
Platelets: 193 10*3/uL (ref 150–400)
RBC: 3.38 MIL/uL — ABNORMAL LOW (ref 3.87–5.11)
RDW: 12.1 % (ref 11.5–15.5)
WBC: 2.7 10*3/uL — ABNORMAL LOW (ref 4.0–10.5)
nRBC: 0 % (ref 0.0–0.2)

## 2019-06-10 ENCOUNTER — Ambulatory Visit
Admission: RE | Admit: 2019-06-10 | Discharge: 2019-06-10 | Disposition: A | Discharge status: Home / Self Care | Payer: Medicare Other | Source: Ambulatory Visit | Attending: Radiation Oncology | Admitting: Radiation Oncology

## 2019-06-10 ENCOUNTER — Inpatient Hospital Stay: Payer: Medicare Other | Admitting: Hospice and Palliative Medicine

## 2019-06-10 ENCOUNTER — Encounter: Payer: Self-pay | Admitting: Hospice and Palliative Medicine

## 2019-06-10 ENCOUNTER — Inpatient Hospital Stay: Payer: Medicare Other

## 2019-06-10 ENCOUNTER — Other Ambulatory Visit: Payer: Self-pay

## 2019-06-10 VITALS — BP 113/69 | HR 60 | Temp 96.9°F | Resp 18 | Ht 59.0 in | Wt 223.2 lb

## 2019-06-10 DIAGNOSIS — R530 Neoplastic (malignant) related fatigue: Secondary | ICD-10-CM

## 2019-06-10 DIAGNOSIS — E860 Dehydration: Secondary | ICD-10-CM

## 2019-06-10 DIAGNOSIS — C541 Malignant neoplasm of endometrium: Secondary | ICD-10-CM

## 2019-06-10 DIAGNOSIS — Z510 Encounter for antineoplastic radiation therapy: Secondary | ICD-10-CM | POA: Diagnosis not present

## 2019-06-10 DIAGNOSIS — Z923 Personal history of irradiation: Secondary | ICD-10-CM | POA: Diagnosis not present

## 2019-06-10 DIAGNOSIS — R531 Weakness: Secondary | ICD-10-CM

## 2019-06-10 DIAGNOSIS — Z9221 Personal history of antineoplastic chemotherapy: Secondary | ICD-10-CM | POA: Diagnosis not present

## 2019-06-10 MED ORDER — DEXAMETHASONE 4 MG PO TABS: 4.0000 mg | ORAL_TABLET | Freq: Every day | ORAL | 0 refills | Status: DC

## 2019-06-10 MED ORDER — SODIUM CHLORIDE 0.9 % IV SOLN
Freq: Once | INTRAVENOUS | Status: AC
  Administered 2019-06-10: 11:00:00 via INTRAVENOUS
  Filled 2019-06-10: qty 250

## 2019-06-10 NOTE — Progress Notes (Signed)
Symptom Management [place]  [phone] [phone]  Patient Care Team: Steele Sizer, MD as PCP - General (Family Medicine) Rubie Maid, MD as Consulting Physician (Obstetrics and Gynecology) Clent Jacks, RN as Registered Nurse   Name of the patient: Molly [name]  [id number]  08-18-1951   Date of visit: 06/10/19  Diagnosis- Endometrial carcinoma stage III  Chief complaint/ Reason for visit- Fatigue  Heme/Onc history:  Oncology History  Endometrial adenocarcinoma ([place])  10/28/2018 Cancer Staging   Staging form: Corpus Uteri - Carcinoma and Carcinosarcoma, AJCC 8th Edition - Clinical stage from 10/28/2018: FIGO Stage IIIC1 (cT1a, cN1a, cM0) - Signed by Sindy Guadeloupe, MD on 10/29/2018   10/29/2018 Initial Diagnosis   Endometrial adenocarcinoma ([place])   11/05/2018 -  Chemotherapy   The patient had palonosetron (ALOXI) injection 0.25 mg, 0.25 mg, Intravenous,  Once, 6 of 6 cycles Administration: 0.25 mg (11/05/2018), 0.25 mg (11/29/2018), 0.25 mg (12/20/2018), 0.25 mg (01/14/2019), 0.25 mg (02/04/2019), 0.25 mg (02/25/2019) pegfilgrastim (NEULASTA ONPRO KIT) injection 6 mg, 6 mg, Subcutaneous, Once, 2 of 2 cycles Administration: 6 mg (11/05/2018), 6 mg (11/29/2018) CARBOplatin (PARAPLATIN) 540 mg in sodium chloride 0.9 % 250 mL chemo infusion, 540 mg (100 % of original dose 540 mg), Intravenous,  Once, 6 of 6 cycles Dose modification:   (original dose 540 mg, Cycle 1) Administration: 540 mg (11/05/2018), 540 mg (11/29/2018), 540 mg (12/20/2018), 540 mg (01/14/2019), 540 mg (02/04/2019), 540 mg (02/25/2019) PACLitaxel (TAXOL) 348 mg in sodium chloride 0.9 % 500 mL chemo infusion (> 83m/m2), 175 mg/m2 = 348 mg, Intravenous,  Once, 6 of 6 cycles Dose modification: 150 mg/m2 (original dose 175 mg/m2, Cycle 4, Reason: Other (see comments), Comment: neuropathy) Administration: 348 mg (11/05/2018), 348 mg (11/29/2018), 348 mg (12/20/2018), 300  mg (01/14/2019), 300 mg (02/04/2019), 300 mg (02/25/2019)  for chemotherapy treatment.      Interval history-Ms. TChalouxis a 68year old woman with stage III adenocarcinoma of the endometrium status post chemotherapy and currently on radiation.  She has had several days of severe fatigue associated with sleeping much of the day and night.  She reports her appetite is declined over the same period.  She denies any fever or chills.  No nausea or vomiting.  No body aches.  No dyspnea.  No changes in pain.  She has occasional loose stools associated with oral intake but that is a chronic problem.  Denies any neurologic complaints. Denies recent fevers or illnesses. Denies any easy bleeding or bruising. Reports good appetite and denies weight loss. Denies chest pain. Denies any nausea, vomiting, constipation, or diarrhea. Denies urinary complaints. Patient offers no further specific complaints today.  ECOG FS:1 - Symptomatic but completely ambulatory  Review of systems- Review of Systems  Constitutional: Positive for malaise/fatigue. Negative for chills, fever and weight loss.  Respiratory: Negative for cough and shortness of breath.   Cardiovascular: Negative for orthopnea and leg swelling.  Gastrointestinal: Negative for abdominal pain, constipation, diarrhea, nausea and vomiting.  Musculoskeletal: Negative for myalgias.  Skin: Negative for rash.  Neurological: Negative for dizziness, sensory change and headaches.  Psychiatric/Behavioral: Negative for depression.     Current treatment- XRT  Allergies  Allergen Reactions  . Ferumoxytol Anaphylaxis  . Nsaids Hives and Rash  . Statins Other (See Comments)    Joint pains  . Victoza [Liraglutide] Other (See Comments)    pancreatitis  . Oxycodone Nausea And Vomiting  . Crestor [Rosuvastatin Calcium] Rash    Past Medical  History:  Diagnosis Date  . Anxiety   . Asthma    history of asthma  . Cardiomyopathy ([place])   . Complication of  anesthesia    tore hair out and made teeth rough  . Depression   . Endometrial cancer ([place]) 08/2018  . GERD (gastroesophageal reflux disease)    takes prilosec prn  . History of CVA (cerebrovascular accident) 12/10/2015  . Hyperlipidemia LDL goal <70 12/10/2015  . Hypertension   . Prediabetes 10/02/2017   A1c 6 in January 2018  . Stroke [facility]) 1990    no residual effects    Past Surgical History:  Procedure Laterality Date  . ABDOMINAL HYSTERECTOMY    . CHOLECYSTECTOMY    . COLONOSCOPY WITH PROPOFOL N/A 01/08/2017   Procedure: COLONOSCOPY WITH PROPOFOL;  Surgeon: [name], MD;  Location: [facility] ENDOSCOPY;  Service: Endoscopy;  Laterality: N/A;  . COLONOSCOPY WITH PROPOFOL N/A 05/05/2018   Procedure: COLONOSCOPY WITH PROPOFOL;  Surgeon: [name], MD;  Location[facility] ENDOSCOPY;  Service: Gastroenterology;  Laterality: N/A;  . HERNIA REPAIR  [id number]/[id number]   umbilical  . PORTACATH PLACEMENT N/A 11/04/2018   Procedure: INSERTION PORT-A-CATH;  Surgeon: Jules Husbands, MD;  Location: [facility] ORS;  Service: General;  Laterality: N/A;  . SENTINEL NODE BIOPSY N/A 10/06/2018   Procedure: SENTINEL NODE BIOPSY;  Surgeon: Mellody Drown, MD;  Location: [facility] ORS;  Service: Gynecology;  Laterality: N/A;  . UMBILICAL HERNIA REPAIR N/A 11/17/2017   Procedure: HERNIA REPAIR UMBILICAL ADULT;  Surgeon: Jules Husbands, MD;  Location: [facility] ORS;  Service: General;  Laterality: N/A;    Social History   Socioeconomic History  . Marital status: Divorced    Spouse name: Not on file  . Number of children: 2  . Years of education: some college  . Highest education level: 12th grade  Occupational History  . 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 Occupation: currently a Careers adviser  . Financial resource strain: Not hard at all  . Food insecurity    Worry: Never true    Inability: Never true  . Transportation needs    Medical: No    Non-medical: No  Tobacco Use  . Smoking  status: Never Smoker  . Smokeless tobacco: Never Used  . Tobacco comment: smoking cessation materials not required  Substance and Sexual Activity  . Alcohol use: No    Alcohol/week: 0.0 standard drinks  . Drug use: No  . Sexual activity: Not Currently  Lifestyle  . Physical activity    Days per week: 1 day    Minutes per session: 20 min  . Stress: Not at all  Relationships  . Social connections    Talks on phone: More than three times a week    Gets together: Three times a week    Attends religious service: More than 4 times per year    Active member of club or organization: Yes    Attends meetings of clubs or organizations: More than 4 times per year    Relationship status: Divorced  . Intimate partner violence    Fear of current or ex partner: No    Emotionally abused: No    Physically abused: No    Forced sexual activity: No  Other Topics Concern  . Not on file  Social History Narrative  . Not on file    Family History  Problem Relation Age of Onset  . Stroke Mother   .  Hypertension Mother   . Dementia Mother   . Alzheimer's disease Mother   . Gout Father   . Asthma Father   . Hypertension Father   . Dementia Father   . Healthy Sister   . Stroke Brother   . Alzheimer's disease Brother   . Healthy Daughter   . Hypertension Brother   . Healthy Brother   . Cancer Paternal Grandmother   . Healthy Sister   . Healthy Sister   . Hypertension Sister   . Hypertension Sister   . Stroke Brother   . Alzheimer's disease Brother   . Stroke Brother   . Hypertension Brother   . Stroke Brother   . Hypertension Brother   . Healthy Brother   . Healthy Brother   . Hypertension Daughter   . Breast cancer Neg Hx      Current Outpatient Medications:  .  acetaminophen (TYLENOL) 500 MG tablet, Take 2 tablets (1,000 mg total) by mouth every 6 (six) hours as needed for mild pain or moderate pain., Disp: 30 tablet, Rfl: 0 .  albuterol (PROVENTIL HFA;VENTOLIN HFA) 108 (90  Base) MCG/ACT inhaler, Inhale 2 puffs into the lungs every 4 (four) hours as needed for wheezing or shortness of breath., Disp: 1 Inhaler, Rfl: 0 .  escitalopram (LEXAPRO) 10 MG tablet, Take 1 tablet (10 mg total) by mouth daily., Disp: 30 tablet, Rfl: 2 .  ezetimibe (ZETIA) 10 MG tablet, Take 1 tablet (10 mg total) by mouth daily., Disp: 30 tablet, Rfl: 2 .  furosemide (LASIX) 40 MG tablet, Take 1 tablet (40 mg total) by mouth daily as needed for fluid., Disp: 90 tablet, Rfl: 0 .  ketoconazole (NIZORAL) 2 % cream, Apply 1 application topically daily as needed for irritation. On abdominal fold prn, Disp: 60 g, Rfl: 0 .  lidocaine-prilocaine (EMLA) cream, Apply to affected area once, Disp: 30 g, Rfl: 3 .  loperamide (IMODIUM) 2 MG capsule, Take 2 mg by mouth as needed for diarrhea or loose stools., Disp: , Rfl:  .  loratadine (CLARITIN) 10 MG tablet, Take 1 tablet by mouth daily., Disp: 30 tablet, Rfl: 5 .  magnesium oxide (MAG-OX) 400 (241.3 Mg) MG tablet, Take 1 tablet (400 mg total) by mouth daily. For hypomagnesemia, Disp: 30 tablet, Rfl: 0 .  olmesartan-hydrochlorothiazide (BENICAR HCT) 20-12.5 MG tablet, Take 1 tablet by mouth daily., Disp: 90 tablet, Rfl: 0 .  omeprazole (PRILOSEC OTC) 20 MG tablet, Take 20 mg by mouth daily as needed., Disp: , Rfl:  .  ondansetron (ZOFRAN) 8 MG tablet, Take 1 tablet (8 mg total) by mouth 2 (two) times daily as needed for refractory nausea / vomiting. Start on day 3 after chemo., Disp: 30 tablet, Rfl: 1 .  Potassium 99 MG TABS, Take 1 tablet by mouth daily., Disp: , Rfl:  .  pregabalin (LYRICA) 75 MG capsule, Take 1 capsule (75 mg total) by mouth 2 (two) times daily., Disp: 60 capsule, Rfl: 2 .  prochlorperazine (COMPAZINE) 10 MG tablet, Take 10 mg by mouth every 6 (six) hours as needed for nausea or vomiting., Disp: , Rfl:  .  triamcinolone ointment (KENALOG) 0.5 %, Apply 1 application topically 2 (two) times daily., Disp: 30 g, Rfl: 0 No current  facility-administered medications for this visit.   Facility-Administered Medications Ordered in Other Visits:  .  sodium chloride flush (NS) 0.9 % injection 10 mL, 10 mL, Intravenous, PRN, Sindy Guadeloupe, MD, 10 mL at 11/15/18 0915 .  sodium chloride  flush (NS) 0.9 % injection 10 mL, 10 mL, Intracatheter, Once PRN, Sindy Guadeloupe, MD  Physical exam:  Vitals:   06/10/19 0951  BP: 113/69  Pulse: 60  Resp: 18  Temp: (!) 96.9 F (36.1 C)  TempSrc: Tympanic  Weight: 223 lb 3.2 oz (101.2 kg)  Height: 4' 11"  (1.499 m)   Physical Exam Constitutional:      Appearance: She is obese.  HENT:     Head: Normocephalic and atraumatic.  Cardiovascular:     Rate and Rhythm: Normal rate and regular rhythm.  Pulmonary:     Effort: Pulmonary effort is normal.     Breath sounds: Normal breath sounds.  Abdominal:     Palpations: Abdomen is soft.  Musculoskeletal:     Right lower leg: No edema.     Left lower leg: No edema.  Skin:    General: Skin is warm and dry.  Neurological:     General: No focal deficit present.     Mental Status: She is oriented to person, place, and time.  Psychiatric:        Mood and Affect: Mood normal.      CMP Latest Ref Rng & Units 04/14/2019  Glucose 70 - 99 mg/dL 102(H)  BUN 8 - 23 mg/dL 14  Creatinine 0.44 - 1.00 mg/dL 0.64  Sodium 135 - 145 mmol/L 137  Potassium 3.5 - 5.1 mmol/L 3.7  Chloride 98 - 111 mmol/L 103  CO2 22 - 32 mmol/L 27  Calcium 8.9 - 10.3 mg/dL 9.2  Total Protein 6.5 - 8.1 g/dL 7.1  Total Bilirubin 0.3 - 1.2 mg/dL 0.5  Alkaline Phos 38 - 126 U/L 60  AST 15 - 41 U/L 19  ALT 0 - 44 U/L 12   CBC Latest Ref Rng & Units 06/09/2019  WBC 4.0 - 10.5 K/uL 2.7(L)  Hemoglobin 12.0 - 15.0 g/dL 10.8(L)  Hematocrit 36.0 - 46.0 % 32.2(L)  Platelets 150 - 400 K/uL 193    No images are attached to the encounter.  No results found.  Assessment and plan- Patient is a 68 y.o. female  with stage III endometrial carcinoma status post chemotherapy  and currently on XRT, with final treatment plan for next week.   Fatigue: Fatigue is most likely related to XRT.  Patient appears to have had chronic fatigue associated with cancer treatments.  She has responded well in the past to IV fluids and steroids.  Will administer 1 L IV fluids today in the clinic.  We will start her on a short course of dexamethasone, which hopefully will improve her symptoms while she concludes her radiation treatments next week.  Methylphenidate could be considered for chronic fatigue but benefits do not outweigh the risks if fatigue resolves upon conclusion of XRT.  Additionally, would caution use of methylphenidate given patient's history of cardiovascular disease.  Does not appear that she has had a recent echocardiogram.  We will plan to add a TSH for next lab draw as it appears the last was checked in 2018.  Follow up: -RTC next week if symptoms do not improve  Patient expressed understanding and was in agreement with this plan. She also understands that She can call clinic at any time with any questions, concerns, or complaints.   Thank you for allowing me to participate in the care of this very pleasant patient.   Time Total: 30 minutes  Visit consisted of counseling and education dealing with the complex and emotionally intense issues of  symptom management and palliative care in the setting of serious and potentially life-threatening illness.Greater than 50%  of this time was spent counseling and coordinating care related to the above assessment and plan.  Signed by: Altha Harm, PhD, NP-C [phone] (Work Cell)   CC:

## 2019-06-13 ENCOUNTER — Other Ambulatory Visit: Payer: Self-pay

## 2019-06-13 ENCOUNTER — Ambulatory Visit
Admission: RE | Admit: 2019-06-13 | Discharge: 2019-06-13 | Disposition: A | Discharge status: Home / Self Care | Payer: Medicare Other | Source: Ambulatory Visit | Attending: Radiation Oncology | Admitting: Radiation Oncology

## 2019-06-13 DIAGNOSIS — Z510 Encounter for antineoplastic radiation therapy: Secondary | ICD-10-CM | POA: Diagnosis not present

## 2019-06-14 ENCOUNTER — Ambulatory Visit: Payer: Medicare Other

## 2019-06-14 ENCOUNTER — Other Ambulatory Visit: Payer: Self-pay

## 2019-06-14 ENCOUNTER — Ambulatory Visit
Admission: RE | Admit: 2019-06-14 | Discharge: 2019-06-14 | Disposition: A | Discharge status: Home / Self Care | Payer: Medicare Other | Source: Ambulatory Visit | Attending: Radiation Oncology | Admitting: Radiation Oncology

## 2019-06-14 DIAGNOSIS — Z510 Encounter for antineoplastic radiation therapy: Secondary | ICD-10-CM | POA: Diagnosis not present

## 2019-06-15 ENCOUNTER — Ambulatory Visit
Admission: RE | Admit: 2019-06-15 | Discharge: 2019-06-15 | Disposition: A | Discharge status: Home / Self Care | Payer: Medicare Other | Source: Ambulatory Visit | Attending: Radiation Oncology | Admitting: Radiation Oncology

## 2019-06-15 ENCOUNTER — Other Ambulatory Visit: Payer: Self-pay

## 2019-06-15 DIAGNOSIS — Z510 Encounter for antineoplastic radiation therapy: Secondary | ICD-10-CM | POA: Diagnosis not present

## 2019-06-22 ENCOUNTER — Inpatient Hospital Stay (HOSPITAL_BASED_OUTPATIENT_CLINIC_OR_DEPARTMENT_OTHER): Payer: Medicare Other | Admitting: Obstetrics and Gynecology

## 2019-06-22 ENCOUNTER — Encounter: Payer: Self-pay | Admitting: Obstetrics and Gynecology

## 2019-06-22 ENCOUNTER — Other Ambulatory Visit: Payer: Self-pay

## 2019-06-22 DIAGNOSIS — C541 Malignant neoplasm of endometrium: Secondary | ICD-10-CM | POA: Diagnosis not present

## 2019-06-22 DIAGNOSIS — Z90710 Acquired absence of both cervix and uterus: Secondary | ICD-10-CM

## 2019-06-22 DIAGNOSIS — Z90722 Acquired absence of ovaries, bilateral: Secondary | ICD-10-CM | POA: Diagnosis not present

## 2019-06-22 DIAGNOSIS — Z9221 Personal history of antineoplastic chemotherapy: Secondary | ICD-10-CM

## 2019-06-22 DIAGNOSIS — Z923 Personal history of irradiation: Secondary | ICD-10-CM

## 2019-06-22 MED ORDER — LIDOCAINE-PRILOCAINE 2.5-2.5 % EX CREA: TOPICAL_CREAM | CUTANEOUS | 3 refills | Status: DC

## 2019-06-22 NOTE — Progress Notes (Signed)
Gynecologic Oncology Interval Visit   Referring Provider: Dr. Marcelline Mates  Chief Complaint: FIGO Stage IIIc1 grade 2 endometrioid endometrial cancer.  Subjective:  Molly [name] is a 68 y.o. female, initially seen in consultation for Dr. Marcelline Mates, grade 1 endometrioid endometrial cancer MLH1- positive methylation, HER-2 negative, now s/p TLH-BSO with SLN mapping and biopsies on 10/06/18 with Dr. Fransisca Connors and Dr. Marcelline Mates at [facility], followed by chemotherapy and radiation with vaginal cuff boost, who returns to clinic today for follow-up.   She received 6 cycles of carbo-taxol on 11/05/2018-02/25/2019. She completed radiation to pelvic and and periaortic nodes 03-31-2019-05/06/2019 with boost to vaginal cuff after 3rd cycle of chemotherapy. She completed on 06/15/2019.   Today, she reports feeling well. She has neuropathy of feet and toes which she is seeking acupuncture for.   Gynecologic Oncology History:  She has history of Grade 3 uterine prolapse with Grade 1 cystocele and rectocele. She noted pessary had become dislodged after coughing d/t pneumonia and presented to Dr. Marcelline Mates for evaluation 05/2018. At that time she noted post-menopausal bleeding and cramping and has history of cervical polyps. Bleeding was felt to be possibly related to pessary.   Ultrasound revealed: uterus measuring 10.2 x 5.5 x 5.7 cm, heterogenous with evidence of fibroids invading endometrium measuring 2.9 x 3.3 x 3.6 cm and two additional fibroids measuring 2.0 x 2.1 x 1.8, and 2.9 x 3.3 x 3.6. Endometrium measuring 4.7 mm. Neither ovary was visualized.   She had second episode of bleeding 08/31/18-09/04/18, felt to be heavier and more 'period like'. Endometrial biopsy was performed. Pathology: - Endometrioid adenocarcinoma, figo grade 1.   Pathology:  DIAGNOSIS:  A. UTERUS WITH CERVIX AND BILATERAL FALLOPIAN TUBES AND OVARIES; TOTAL HYSTERECTOMY WITH BILATERAL SALPINGO-OOPHORECTOMY:  - ENDOMETRIAL ADENOCARCINOMA.  - SEE CANCER SUMMARY  BELOW.  - CERVIX WITH NABOTHIAN CYSTS, CHRONIC CERVICITIS WITH SQUAMOUS METAPLASIA, AND 0.9 CM ENDOCERVICAL POLYP.  - INACTIVE BACKGROUND ENDOMETRIUM WITH ENDOMETRIAL POLYP.  - MYOMETRIUM WITH ADENOMYOSIS AND LEIOMYOMATA, LARGEST MEASURING 4.5 CM.  - UNREMARKABLE FALLOPIAN TUBES AND OVARIES.   B. SENTINEL LYMPH NODE, LEFT OBTURATOR; EXCISION:  - METASTATIC CARCINOMA INVOLVES ONE LYMPH NODE (1/1).   C. SENTINEL LYMPH NODE, LEFT EXTERNAL ILIAC; EXCISION:  - LYMPHOID TISSUE NOT PRESENT.  - NEGATIVE FOR MALIGNANCY.   D. SENTINEL LYMPH NODE, RIGHT EXTERNAL ILIAC; EXCISION:  - METASTATIC CARCINOMA INVOLVES ONE OF TWO LYMPH NODES (1/2).   LVSI present and atypical washings.  Invasion 6/18 and cervix and adnexa negative.   MLH1: LOSS of protein expression  MSH2: Intact nuclear expression  MSH6: Intact nuclear expression  PMS2: LOSS of protein expression   MLH1- positive methylation HER-2 negative  Pathologic Stage: FIGO stage IIIC1 grade 2   Problem List: Patient Active Problem List   Diagnosis Date Noted  . 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 Immunosuppressed status ([place]) 01/11/2019  . Iron deficiency anemia 11/15/2018  . Endometrial adenocarcinoma ([place]) 10/29/2018  . Umbilical hernia without obstruction and without gangrene   . Atherosclerosis of right coronary artery 11/10/2017  . Atherosclerosis of abdominal aorta ([place]) 11/10/2017  . Prediabetes 10/02/2017  . Pain in limb 03/31/2017  . Perennial allergic rhinitis with seasonal variation 05/14/2016  . Asthma, mild intermittent, well-controlled 05/14/2016  . Hypertension goal BP (blood pressure) < 140/90 12/10/2015  . Cardiomyopathy due to hypertension ([place]) 12/10/2015  . History of CVA (cerebrovascular accident) 12/10/2015  . Hyperlipidemia LDL goal <70 12/10/2015  . Statin intolerance 12/10/2015  Past Medical History: Past Medical History:  Diagnosis  Date  . Anxiety   . Asthma    history of asthma  . Cardiomyopathy ([place])   . Complication of anesthesia    tore hair out and made teeth rough  . Depression   . Endometrial cancer ([place]) 08/2018  . GERD (gastroesophageal reflux disease)    takes prilosec prn  . History of CVA (cerebrovascular accident) 12/10/2015  . Hyperlipidemia LDL goal <70 12/10/2015  . Hypertension   . Prediabetes 10/02/2017   A1c 6 in January 2018  . Stroke [facility]) 1990    no residual effects    Past Surgical History: Past Surgical History:  Procedure Laterality Date  . ABDOMINAL HYSTERECTOMY    . CHOLECYSTECTOMY    . COLONOSCOPY WITH PROPOFOL N/A 01/08/2017   Procedure: COLONOSCOPY WITH PROPOFOL;  Surgeon: [name], MD;  Location: [facility] ENDOSCOPY;  Service: Endoscopy;  Laterality: N/A;  . COLONOSCOPY WITH PROPOFOL N/A 05/05/2018   Procedure: COLONOSCOPY WITH PROPOFOL;  Surgeon: [name], MD;  Location[facility] ENDOSCOPY;  Service: Gastroenterology;  Laterality: N/A;  . HERNIA REPAIR  [id number]   umbilical  . PORTACATH PLACEMENT N/A 11/04/2018   Procedure: INSERTION PORT-A-CATH;  Surgeon: Jules Husbands, MD;  Location: [facility] ORS;  Service: General;  Laterality: N/A;  . SENTINEL NODE BIOPSY N/A 10/06/2018   Procedure: SENTINEL NODE BIOPSY;  Surgeon: Mellody Drown, MD;  Location: [facility] ORS;  Service: Gynecology;  Laterality: N/A;  . UMBILICAL HERNIA REPAIR N/A 11/17/2017   Procedure: HERNIA REPAIR UMBILICAL ADULT;  Surgeon: Jules Husbands, MD;  Location: [facility] ORS;  Service: General;  Laterality: N/A;   Past Gynecologic History:  [id number] Denies abnormal pap smears.  History of OCP use.  Denies STD history  OB History:  OB History  Gravida Para Term Preterm AB Living  2 2 2     2   SAB TAB Ectopic Multiple Live Births          2    # Outcome Date GA Lbr Len/2nd Weight Sex Delivery Anes PTL Lv  2 Term 35    F Vag-Spont   LIV  1 Term 18    F Vag-Spont   LIV    Family History: Family History  Problem  Relation Age of Onset  . Stroke Mother   . Hypertension Mother   . Dementia Mother   . Alzheimer's disease Mother   . Gout Father   . Asthma Father   . Hypertension Father   . Dementia Father   . Healthy Sister   . Stroke Brother   . Alzheimer's disease Brother   . Healthy Daughter   . Hypertension Brother   . Healthy Brother   . Cancer Paternal Grandmother   . Healthy Sister   . Healthy Sister   . Hypertension Sister   . Hypertension Sister   . Stroke Brother   . Alzheimer's disease Brother   . Stroke Brother   . Hypertension Brother   . Stroke Brother   . Hypertension Brother   . Healthy Brother   . Healthy Brother   . Hypertension Daughter   . Breast cancer Neg Hx    Social History: Social History   Socioeconomic History  . Marital status: Divorced    Spouse name: Not on file  . Number of children: 2  . Years of education: some college  . Highest education level: 12th grade  Occupational History  . 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 Occupation: currently a Careers adviser  . Financial resource strain: Not hard at all  . Food insecurity    Worry: Never true    Inability: Never true  . Transportation needs    Medical: No    Non-medical: No  Tobacco Use  . Smoking status: Never Smoker  . Smokeless tobacco: Never Used  . Tobacco comment: smoking cessation materials not required  Substance and Sexual Activity  . Alcohol use: No    Alcohol/week: 0.0 standard drinks  . Drug use: No  . Sexual activity: Not Currently  Lifestyle  . Physical activity    Days per week: 1 day    Minutes per session: 20 min  . Stress: Not at all  Relationships  . Social connections    Talks on phone: More than three times a week    Gets together: Three times a week    Attends religious service: More than 4 times per year    Active member of club or organization: Yes    Attends meetings of clubs or organizations: More than 4 times per year     Relationship status: Divorced  . Intimate partner violence    Fear of current or ex partner: No    Emotionally abused: No    Physically abused: No    Forced sexual activity: No  Other Topics Concern  . Not on file  Social History Narrative  . Not on file    Allergies: Allergies  Allergen Reactions  . Ferumoxytol Anaphylaxis  . Nsaids Hives and Rash  . Statins Other (See Comments)    Joint pains  . Victoza [Liraglutide] Other (See Comments)    pancreatitis  . Oxycodone Nausea And Vomiting  . Crestor [Rosuvastatin Calcium] Rash    Current Medications: Current Outpatient Medications  Medication Sig Dispense Refill  . acetaminophen (TYLENOL) 500 MG tablet Take 2 tablets (1,000 mg total) by mouth every 6 (six) hours as needed for mild pain or moderate pain. 30 tablet 0  . albuterol (PROVENTIL HFA;VENTOLIN HFA) 108 (90 Base) MCG/ACT inhaler Inhale 2 puffs into the lungs every 4 (four) hours as needed for wheezing or shortness of breath. 1 Inhaler 0  . escitalopram (LEXAPRO) 10 MG tablet Take 1 tablet (10 mg total) by mouth daily. 30 tablet 2  . ezetimibe (ZETIA) 10 MG tablet Take 1 tablet (10 mg total) by mouth daily. 30 tablet 2  . furosemide (LASIX) 40 MG tablet Take 1 tablet (40 mg total) by mouth daily as needed for fluid. 90 tablet 0  . ketoconazole (NIZORAL) 2 % cream Apply 1 application topically daily as needed for irritation. On abdominal fold prn 60 g 0  . loratadine (CLARITIN) 10 MG tablet Take 1 tablet by mouth daily. 30 tablet 5  . magnesium oxide (MAG-OX) 400 (241.3 Mg) MG tablet Take 1 tablet (400 mg total) by mouth daily. For hypomagnesemia 30 tablet 0  . olmesartan-hydrochlorothiazide (BENICAR HCT) 20-12.5 MG tablet Take 1 tablet by mouth daily. 90 tablet 0  . omeprazole (PRILOSEC OTC) 20 MG tablet Take 20 mg by mouth daily as needed.    . Potassium 99 MG TABS Take 1 tablet by mouth daily.    . pregabalin (LYRICA) 75 MG capsule Take 1 capsule (75 mg total) by mouth  2 (two) times daily. 60 capsule 2  . triamcinolone ointment (KENALOG) 0.5 % Apply 1 application topically 2 (two) times daily. 30 g 0  . lidocaine-prilocaine (EMLA)  cream Apply to affected area once 30 g 3  . loperamide (IMODIUM) 2 MG capsule Take 2 mg by mouth as needed for diarrhea or loose stools.    . ondansetron (ZOFRAN) 8 MG tablet Take 1 tablet (8 mg total) by mouth 2 (two) times daily as needed for refractory nausea / vomiting. Start on day 3 after chemo. (Patient not taking: Reported on 06/22/2019) 30 tablet 1  . prochlorperazine (COMPAZINE) 10 MG tablet Take 10 mg by mouth every 6 (six) hours as needed for nausea or vomiting.     No current facility-administered medications for this visit.    Facility-Administered Medications Ordered in Other Visits  Medication Dose Route Frequency Provider Last Rate Last Dose  . sodium chloride flush (NS) 0.9 % injection 10 mL  10 mL Intravenous PRN Sindy Guadeloupe, MD   10 mL at 11/15/18 0915  . sodium chloride flush (NS) 0.9 % injection 10 mL  10 mL Intracatheter Once PRN Sindy Guadeloupe, MD       Review of Systems General:  no complaints Skin: no complaints Eyes: no complaints HEENT: no complaints Breasts: no complaints Pulmonary: no complaints Cardiac: no complaints Gastrointestinal: no complaints Genitourinary/Sexual: no complaints Ob/Gyn: no complaints Musculoskeletal: no complaints Hematology: no complaints Neurologic/Psych: no complaints   Objective:  Physical Examination:  BP 134/76   Pulse (!) 56   Temp (!) 96.3 F (35.7 C) (Tympanic)   Resp 20   Ht 4' 11"  (1.499 m)   Wt 223 lb (101.2 kg)   BMI 45.04 kg/m     ECOG Performance Status: 0 - Asymptomatic  GENERAL: Patient is a well appearing female in no acute distress HEENT:  Sclera clear. Anicteric NODES:  Negative axillary, supraclavicular, inguinal lymph node survery LUNGS:  Clear to auscultation bilaterally.   HEART:  Regular rate and rhythm.  ABDOMEN:  Soft,  nontender.  No hernias, incisions well healed. No masses or ascites EXTREMITIES:  No peripheral edema. Atraumatic. No cyanosis SKIN:  Clear with no obvious rashes or skin changes.  NEURO:  Nonfocal. Well oriented.  Appropriate affect.  Pelvic: EGBUS: no lesions Vagina: cuff well healed.  Bimanual/RV: no masses  Assessment:  Molly [name] is a 68 y.o. female diagnosed with stage IIIC1 grade 2 endometrial cancer with bilateral positive pelvic SLNs with macroscopic tumor s/p TLH/BSO pelvic SLN biopsies 10/06/18.  Normal Exam today.   Tumor has MLH1 loss with promoter methylation. HER-2 negative.   Medical co-morbidities complicating care: Morbid obesity, CVA 1990 with no residual and no medication, HTN with good BP today.    Plan:   Problem List Items Addressed This Visit      Genitourinary   Endometrial adenocarcinoma ([place])   Relevant Medications   lidocaine-prilocaine (EMLA) cream      Adjuvant chemotherapy with carbo/taxol pelvic/PA radiation and vaginal brachytherapy with Dr. Janese Banks and Dr. Baruch Gouty.  Tumor has MLH1 loss with promoter methylation, so she would be a candidate for immune checkpoint inhibitor therapy if she has a recurrence in the future. HER-2 negative.   We will see her back in 4 months or sooner if any symptoms arise.     Mellody Drown, MD  CC:  Steele Sizer, [place] [address] [place],  [place] [address] [phone]

## 2019-06-29 ENCOUNTER — Telehealth: Payer: Self-pay | Admitting: Family Medicine

## 2019-06-29 DIAGNOSIS — I10 Essential (primary) hypertension: Secondary | ICD-10-CM

## 2019-06-29 MED ORDER — OLMESARTAN MEDOXOMIL-HCTZ 20-12.5 MG PO TABS: 1.0000 | ORAL_TABLET | Freq: Every day | ORAL | 0 refills | Status: DC

## 2019-06-29 NOTE — Telephone Encounter (Signed)
Pt needs refill on  BP meds to be sent to [facility]

## 2019-07-06 ENCOUNTER — Other Ambulatory Visit: Payer: Self-pay | Admitting: Family Medicine

## 2019-07-06 NOTE — Telephone Encounter (Signed)
Refill request for general medication. Claritin to [facility]  Last office visit 05/13/2019   Follow up on 08/15/2019

## 2019-07-18 ENCOUNTER — Inpatient Hospital Stay (HOSPITAL_BASED_OUTPATIENT_CLINIC_OR_DEPARTMENT_OTHER): Payer: Medicare Other | Admitting: Oncology

## 2019-07-18 ENCOUNTER — Inpatient Hospital Stay: Payer: Medicare Other | Attending: Oncology

## 2019-07-18 ENCOUNTER — Other Ambulatory Visit: Payer: Self-pay

## 2019-07-18 ENCOUNTER — Encounter: Payer: Self-pay | Admitting: Oncology

## 2019-07-18 VITALS — BP 107/75 | HR 61 | Temp 96.5°F | Resp 18 | Ht 59.0 in | Wt 222.5 lb

## 2019-07-18 DIAGNOSIS — D649 Anemia, unspecified: Secondary | ICD-10-CM

## 2019-07-18 DIAGNOSIS — C541 Malignant neoplasm of endometrium: Secondary | ICD-10-CM | POA: Insufficient documentation

## 2019-07-18 LAB — CBC WITH DIFFERENTIAL/PLATELET
Abs Immature Granulocytes: 0.01 10*3/uL (ref 0.00–0.07)
Basophils Absolute: 0 10*3/uL (ref 0.0–0.1)
Basophils Relative: 0 %
Eosinophils Absolute: 0.1 10*3/uL (ref 0.0–0.5)
Eosinophils Relative: 1 %
HCT: 32.1 % — ABNORMAL LOW (ref 36.0–46.0)
Hemoglobin: 10.7 g/dL — ABNORMAL LOW (ref 12.0–15.0)
Immature Granulocytes: 0 %
Lymphocytes Relative: 23 %
Lymphs Abs: 1.2 10*3/uL (ref 0.7–4.0)
MCH: 31.4 pg (ref 26.0–34.0)
MCHC: 33.3 g/dL (ref 30.0–36.0)
MCV: 94.1 fL (ref 80.0–100.0)
Monocytes Absolute: 0.5 10*3/uL (ref 0.1–1.0)
Monocytes Relative: 10 %
Neutro Abs: 3.4 10*3/uL (ref 1.7–7.7)
Neutrophils Relative %: 66 %
Platelets: 199 10*3/uL (ref 150–400)
RBC: 3.41 MIL/uL — ABNORMAL LOW (ref 3.87–5.11)
RDW: 12.6 % (ref 11.5–15.5)
WBC: 5.3 10*3/uL (ref 4.0–10.5)
nRBC: 0 % (ref 0.0–0.2)

## 2019-07-18 LAB — COMPREHENSIVE METABOLIC PANEL
ALT: 18 U/L (ref 0–44)
AST: 24 U/L (ref 15–41)
Albumin: 4 g/dL (ref 3.5–5.0)
Alkaline Phosphatase: 71 U/L (ref 38–126)
Anion gap: 9 (ref 5–15)
BUN: 15 mg/dL (ref 8–23)
CO2: 28 mmol/L (ref 22–32)
Calcium: 9.1 mg/dL (ref 8.9–10.3)
Chloride: 104 mmol/L (ref 98–111)
Creatinine, Ser: 0.73 mg/dL (ref 0.44–1.00)
GFR calc Af Amer: >60 mL/min (ref >60)
GFR calc non Af Amer: >60 mL/min (ref >60)
Glucose, Bld: 113 mg/dL — ABNORMAL HIGH (ref 70–99)
Potassium: 3.5 mmol/L (ref 3.5–5.1)
Sodium: 141 mmol/L (ref 135–145)
Total Bilirubin: 0.7 mg/dL (ref 0.3–1.2)
Total Protein: 7.2 g/dL (ref 6.5–8.1)

## 2019-07-18 MED ORDER — HEPARIN SOD (PORK) LOCK FLUSH 100 UNIT/ML IV SOLN
500.0000 [IU] | Freq: Once | INTRAVENOUS | Status: AC
  Administered 2019-07-18: 500 [IU] via INTRAVENOUS
  Filled 2019-07-18: qty 5

## 2019-07-18 MED ORDER — SODIUM CHLORIDE 0.9% FLUSH
10.0000 mL | Freq: Once | INTRAVENOUS | Status: AC
  Administered 2019-07-18: 10 mL via INTRAVENOUS
  Filled 2019-07-18: qty 10

## 2019-07-18 NOTE — Progress Notes (Signed)
Pt has a pin dot bump on her top of pubic area at the labia starting, not sore. She noticed while washing

## 2019-07-19 LAB — CA 125: Cancer Antigen (CA) 125: 10.2 U/mL (ref 0.0–38.1)

## 2019-07-19 NOTE — Progress Notes (Signed)
Hematology/Oncology Consult note 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 Marcelline Mates for cystocele and rectocele. She was noted to have postmenopausal bleeding and cramping recently. Ultrasound revealed heterogeneous appearance of uterus with fibroids invading the endometrium. She underwent an endometrial biopsy when she had a second bout of postmenopausal bleeding. Biopsy showed endometrioid adenocarcinoma FIGO grade 1. She was then seen by Dr. Fransisca Connors and plan was to proceed with laparoscopic hysterectomy and bilateral salpingo-oophorectomy along with pelvic lymph node sampling  Final pathology showed: Endometrioid carcinoma FIGO grade 2 with 33% myometrial invasion. Lymphovascular invasion present. Peritoneal/ascites fluid atypical. 2 out of 3 sentinel lymph nodes were positive for macro metastases. pT1a pN1a. FIGO IIIC1  Patient's case was discussed at tumor board and consensus was to proceed with 3 cycles of adjuvant carbotaxol followed by radiation followed by 3 more cycles of chemotherapy. Patient is already met with Dr. Donella Stade who will be giving radiation therapy to pelvic and periaortic lymph nodes and boost to her vaginal cuff. PET CT scan showed metastatic adenopathy and retroperitoneal and external iliac group of lymph nodes in the right side. No evidence of metastatic disease elsewhere.  First cycle of  chemotherapy was given on 11/05/2018.She completed 6 cycles on 02/25/2019.  She completed whole pelvic radiation in June 2020.  Interval history-overall she feels much better since she completed radiation.  She has mild fatigue but denies other complaints at this time.  Denies any pain  ECOG PS- 1 Pain scale- 0   Review of systems- Review of Systems  Constitutional: Positive for malaise/fatigue. Negative for chills, fever and weight loss.  HENT: Negative for congestion, ear discharge and nosebleeds.   Eyes: Negative for blurred vision.  Respiratory: Negative for cough, hemoptysis, sputum production, shortness of breath and wheezing.   Cardiovascular: Negative for chest pain, palpitations, orthopnea and claudication.  Gastrointestinal: Negative for abdominal pain, blood in stool, constipation, diarrhea, heartburn, melena, nausea and vomiting.  Genitourinary: Negative for dysuria, flank pain, frequency, hematuria and urgency.  Musculoskeletal: Negative for back pain, joint pain and myalgias.  Skin: Negative for rash.  Neurological: Negative for dizziness, tingling, focal weakness, seizures, weakness and headaches.  Endo/Heme/Allergies: Does not bruise/bleed easily.  Psychiatric/Behavioral: Negative for depression and suicidal ideas. The patient does not have insomnia.      Allergies  Allergen Reactions  . Ferumoxytol Anaphylaxis  . Nsaids Hives and Rash  . Statins Other (See Comments)    Joint pains  . Victoza [Liraglutide] Other (See Comments)    pancreatitis  . Oxycodone Nausea And Vomiting  . Crestor [Rosuvastatin Calcium] Rash     Past Medical History:  Diagnosis Date  . Anxiety   . Asthma    history of asthma  . Cardiomyopathy ([place])   . Complication of anesthesia    tore hair out and made teeth rough  . Depression   . Endometrial cancer ([place]) 08/2018  . GERD (gastroesophageal reflux disease)    takes prilosec prn  . History of CVA (cerebrovascular accident)  12/10/2015  . Hyperlipidemia LDL goal <70 12/10/2015  . Hypertension   . Prediabetes 10/02/2017   A1c 6 in January 2018  . Stroke [facility]) 1990    no residual effects     Past Surgical History:  Procedure Laterality Date  . ABDOMINAL HYSTERECTOMY    . CHOLECYSTECTOMY    . COLONOSCOPY WITH PROPOFOL N/A 01/08/2017   Procedure: COLONOSCOPY WITH PROPOFOL;  Surgeon: [name], MD;  Location: [facility] ENDOSCOPY;  Service: Endoscopy;  Laterality: N/A;  . COLONOSCOPY WITH PROPOFOL N/A 05/05/2018   Procedure: COLONOSCOPY WITH PROPOFOL;  Surgeon: [name], MD;  Location: [facility] ENDOSCOPY;  Service: Gastroenterology;  Laterality: N/A;  . HERNIA REPAIR  [id number]/5284   umbilical  . PORTACATH PLACEMENT N/A 11/04/2018   Procedure: INSERTION PORT-A-CATH;  Surgeon: Jules Husbands, MD;  Location: [facility] ORS;  Service: General;  Laterality: N/A;  . SENTINEL NODE BIOPSY N/A 10/06/2018   Procedure: SENTINEL NODE BIOPSY;  Surgeon: Mellody Drown, MD;  Location: [facility] ORS;  Service: Gynecology;  Laterality: N/A;  . UMBILICAL HERNIA REPAIR N/A 11/17/2017   Procedure: HERNIA REPAIR UMBILICAL ADULT;  Surgeon: Jules Husbands, MD;  Location: [facility] ORS;  Service: General;  Laterality: N/A;    Social History   Socioeconomic History  . Marital status: Divorced    Spouse name: Not on file  . Number of children: 2  . Years of education: some college  . Highest education level: 12th grade  Occupational History  . 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 Occupation: currently a Careers adviser  . Financial resource strain: Not hard at all  . Food insecurity    Worry: Never true    Inability: Never true  . Transportation needs    Medical: No    Non-medical: No  Tobacco Use  . Smoking status: Never Smoker  . Smokeless tobacco: Never Used  . Tobacco comment: smoking cessation materials not required  Substance and Sexual Activity  . Alcohol use: No    Alcohol/week: 0.0 standard drinks  . Drug  use: No  . Sexual activity: Not Currently  Lifestyle  . Physical activity    Days per week: 1 day    Minutes per session: 20 min  . Stress: Not at all  Relationships  . Social connections    Talks on phone: More than three times a week    Gets together: Three times a week    Attends religious service: More than 4 times per year    Active member of club or organization: Yes    Attends meetings of clubs or organizations: More than 4 times per year    Relationship status: Divorced  . Intimate partner violence    Fear of current or ex partner: No    Emotionally abused: No    Physically abused: No    Forced sexual activity: No  Other Topics Concern  . Not on file  Social History Narrative  . Not on file    Family History  Problem Relation Age of Onset  . Stroke Mother   . Hypertension Mother   . Dementia Mother   . Alzheimer's disease Mother   . Gout Father   . Asthma Father   . Hypertension Father   . Dementia Father   . Healthy Sister   . Stroke Brother   . Alzheimer's disease Brother   . Healthy Daughter   . Hypertension Brother   . Healthy Brother   .  Cancer Paternal Grandmother   . Healthy Sister   . Healthy Sister   . Hypertension Sister   . Hypertension Sister   . Stroke Brother   . Alzheimer's disease Brother   . Stroke Brother   . Hypertension Brother   . Stroke Brother   . Hypertension Brother   . Healthy Brother   . Healthy Brother   . Hypertension Daughter   . Breast cancer Neg Hx      Current Outpatient Medications:  .  acetaminophen (TYLENOL) 500 MG tablet, Take 2 tablets (1,000 mg total) by mouth every 6 (six) hours as needed for mild pain or moderate pain., Disp: 30 tablet, Rfl: 0 .  albuterol (PROVENTIL HFA;VENTOLIN HFA) 108 (90 Base) MCG/ACT inhaler, Inhale 2 puffs into the lungs every 4 (four) hours as needed for wheezing or shortness of breath., Disp: 1 Inhaler, Rfl: 0 .  escitalopram (LEXAPRO) 10 MG tablet, Take 1 tablet (10 mg total) by  mouth daily., Disp: 30 tablet, Rfl: 2 .  ezetimibe (ZETIA) 10 MG tablet, Take 1 tablet (10 mg total) by mouth daily., Disp: 30 tablet, Rfl: 2 .  furosemide (LASIX) 40 MG tablet, Take 1 tablet (40 mg total) by mouth daily as needed for fluid., Disp: 90 tablet, Rfl: 0 .  ketoconazole (NIZORAL) 2 % cream, Apply 1 application topically daily as needed for irritation. On abdominal fold prn, Disp: 60 g, Rfl: 0 .  lidocaine-prilocaine (EMLA) cream, Apply to affected area once, Disp: 30 g, Rfl: 3 .  loratadine (CLARITIN) 10 MG tablet, Take 1 tablet by mouth daily., Disp: 30 tablet, Rfl: 4 .  magnesium oxide (MAG-OX) 400 (241.3 Mg) MG tablet, Take 1 tablet (400 mg total) by mouth daily. For hypomagnesemia, Disp: 30 tablet, Rfl: 0 .  olmesartan-hydrochlorothiazide (BENICAR HCT) 20-12.5 MG tablet, Take 1 tablet by mouth daily., Disp: 90 tablet, Rfl: 0 .  omeprazole (PRILOSEC OTC) 20 MG tablet, Take 20 mg by mouth daily as needed., Disp: , Rfl:  .  ondansetron (ZOFRAN) 8 MG tablet, Take 1 tablet (8 mg total) by mouth 2 (two) times daily as needed for refractory nausea / vomiting. Start on day 3 after chemo., Disp: 30 tablet, Rfl: 1 .  Potassium 99 MG TABS, Take 1 tablet by mouth daily., Disp: , Rfl:  .  pregabalin (LYRICA) 75 MG capsule, Take 1 capsule (75 mg total) by mouth 2 (two) times daily., Disp: 60 capsule, Rfl: 2 .  triamcinolone ointment (KENALOG) 0.5 %, Apply 1 application topically 2 (two) times daily. (Patient taking differently: Apply 1 application topically 2 (two) times daily as needed. ), Disp: 30 g, Rfl: 0 .  loperamide (IMODIUM) 2 MG capsule, Take 2 mg by mouth as needed for diarrhea or loose stools., Disp: , Rfl:  .  prochlorperazine (COMPAZINE) 10 MG tablet, Take 10 mg by mouth every 6 (six) hours as needed for nausea or vomiting., Disp: , Rfl:  No current facility-administered medications for this visit.   Facility-Administered Medications Ordered in Other Visits:  .  sodium chloride flush  (NS) 0.9 % injection 10 mL, 10 mL, Intravenous, PRN, Sindy Guadeloupe, MD, 10 mL at 11/15/18 0915 .  sodium chloride flush (NS) 0.9 % injection 10 mL, 10 mL, Intracatheter, Once PRN, Sindy Guadeloupe, MD  Physical exam:  Vitals:   07/18/19 0950  BP: 107/75  Pulse: 61  Resp: 18  Temp: (!) 96.5 F (35.8 C)  TempSrc: Tympanic  Weight: 222 lb 8 oz (100.9 kg)  Height: 4' 11"  (1.499 m)   Physical Exam Constitutional:      General: She is not in acute distress.    Appearance: She is obese.  HENT:     Head: Normocephalic and atraumatic.  Eyes:     Pupils: Pupils are equal, round, and reactive to light.  Neck:     Musculoskeletal: Normal range of motion.  Cardiovascular:     Rate and Rhythm: Normal rate and regular rhythm.     Heart sounds: Normal heart sounds.  Pulmonary:     Effort: Pulmonary effort is normal.     Breath sounds: Normal breath sounds.  Abdominal:     General: Bowel sounds are normal. There is no distension.     Palpations: Abdomen is soft.     Tenderness: There is no abdominal tenderness.  Skin:    General: Skin is warm and dry.  Neurological:     Mental Status: She is alert and oriented to person, place, and time.      CMP Latest Ref Rng & Units 07/18/2019  Glucose 70 - 99 mg/dL 113(H)  BUN 8 - 23 mg/dL 15  Creatinine 0.44 - 1.00 mg/dL 0.73  Sodium 135 - 145 mmol/L 141  Potassium 3.5 - 5.1 mmol/L 3.5  Chloride 98 - 111 mmol/L 104  CO2 22 - 32 mmol/L 28  Calcium 8.9 - 10.3 mg/dL 9.1  Total Protein 6.5 - 8.1 g/dL 7.2  Total Bilirubin 0.3 - 1.2 mg/dL 0.7  Alkaline Phos 38 - 126 U/L 71  AST 15 - 41 U/L 24  ALT 0 - 44 U/L 18   CBC Latest Ref Rng & Units 07/18/2019  WBC 4.0 - 10.5 K/uL 5.3  Hemoglobin 12.0 - 15.0 g/dL 10.7(L)  Hematocrit 36.0 - 46.0 % 32.1(L)  Platelets 150 - 400 K/uL 199      Assessment and plan- Patient is a 68 y.o. female with stage IIIc grade 2 endometrial carcinoma with bilateral positive pelvic sentinel lymph node s/p TLH/BSO with  pelvic sentinel lymph node biopsies in October 2019 followed by adjuvant chemotherapy and radiation treatment.  This is a routine follow-up of her endometrial carcinoma  During her last visit patient did have leukopenia/neutropenia likely secondary to ongoing pelvic radiation.  Her white count has now normalized.  She has chronic anemia and her hemoglobin runs around 10 likely secondary to chronic disease.  Presently no concerning signs symptoms look suspicious for recurrence.  During her last CT scan in March 2020 there was decrease in size of periaortic and external iliac adenopathy and a 2 mm pulmonary nodule noted.  I will plan to get one set of repeat scans in September 2020.  At that time if she is known to have normal evidence of recurrence/progression she will continue clinical surveillance based on signs and symptoms and no further scans.  I will see her back in 2 months time with CBC with differential, CMP, Ca1 25.  She will need a CT chest abdomen pelvis with contrast prior.  I will consider taking her port out at that time   Visit Diagnosis 1. Endometrial adenocarcinoma ([place])      Dr. Randa Evens, MD, MPH [facility] at [facility] [phone] 07/19/2019 8:14 AM

## 2019-07-26 ENCOUNTER — Other Ambulatory Visit: Payer: Self-pay | Admitting: Family Medicine

## 2019-07-26 ENCOUNTER — Encounter: Payer: Self-pay | Admitting: Oncology

## 2019-07-26 ENCOUNTER — Encounter: Payer: Self-pay | Admitting: Radiation Oncology

## 2019-07-26 ENCOUNTER — Encounter: Payer: Self-pay | Admitting: Family Medicine

## 2019-07-26 ENCOUNTER — Other Ambulatory Visit: Payer: Self-pay | Admitting: *Deleted

## 2019-07-26 DIAGNOSIS — Z20822 Contact with and (suspected) exposure to covid-19: Secondary | ICD-10-CM

## 2019-07-26 DIAGNOSIS — C541 Malignant neoplasm of endometrium: Secondary | ICD-10-CM

## 2019-07-26 DIAGNOSIS — Z20828 Contact with and (suspected) exposure to other viral communicable diseases: Secondary | ICD-10-CM

## 2019-07-26 NOTE — Progress Notes (Signed)
[id number] 

## 2019-07-27 ENCOUNTER — Encounter: Payer: Self-pay | Admitting: Family Medicine

## 2019-07-27 ENCOUNTER — Other Ambulatory Visit: Payer: Self-pay

## 2019-07-27 ENCOUNTER — Ambulatory Visit (INDEPENDENT_AMBULATORY_CARE_PROVIDER_SITE_OTHER): Payer: Medicare Other | Admitting: Family Medicine

## 2019-07-27 ENCOUNTER — Ambulatory Visit: Payer: Medicare Other | Admitting: Radiation Oncology

## 2019-07-27 DIAGNOSIS — Z20822 Contact with and (suspected) exposure to covid-19: Secondary | ICD-10-CM

## 2019-07-27 DIAGNOSIS — Z20828 Contact with and (suspected) exposure to other viral communicable diseases: Secondary | ICD-10-CM

## 2019-07-27 DIAGNOSIS — R6889 Other general symptoms and signs: Secondary | ICD-10-CM | POA: Diagnosis not present

## 2019-07-27 NOTE — Progress Notes (Signed)
Name: Molly [name]   MRN: [id number]    DOB: Feb 02, 1951   Date:07/27/2019       Progress Note  Subjective  Chief Complaint  Chief Complaint  Patient presents with  . Cough    scratchy thtroat, congested, possible exposure to Covid    I connected with  [name]  on 55/97/41 at  8:20 AM EDT by a video enabled telemedicine application and verified that I am speaking with the correct person using two identifiers.  I discussed the limitations of evaluation and management by telemedicine and the availability of in person appointments. The patient expressed understanding and agreed to proceed. Staff also discussed with the patient that there may be a patient responsible charge related to this service. Patient Location: at daughter's house Provider Location: [facility] Additional Individuals present: Barnie Alderman , Marjean Donna  HPI  Possible COVID-19 exposure:  her father was hospitalized at the [place] and died yesterday, she was notified afterwards that the person that was sharing a room with him was positive to COVID-19., she will get tested today for COVID-19 ( order sent by Dr. Janese Banks, her oncologist,yesterday) The last day she visit her father was Friday July 24th, 2020 - it was a big room, the two patients were separated by a curtain and beds were about 8 feet apart. She had her mask on and so did her father. She spend about one hour with him. She denies any symptoms. She was exposed to a lot of family members and family yesterday before she was notified about the possible exposure. She has been asymptomatic, but explained that she still needs to self quarantine   Patient Active Problem List   Diagnosis Date Noted  . 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 Immunosuppressed status ([place]) 01/11/2019  . Iron deficiency anemia 11/15/2018  . Endometrial adenocarcinoma ([place]) 10/29/2018  . Umbilical hernia without  obstruction and without gangrene   . Atherosclerosis of right coronary artery 11/10/2017  . Atherosclerosis of abdominal aorta ([place]) 11/10/2017  . Prediabetes 10/02/2017  . Pain in limb 03/31/2017  . Perennial allergic rhinitis with seasonal variation 05/14/2016  . Asthma, mild intermittent, well-controlled 05/14/2016  . Hypertension goal BP (blood pressure) < 140/90 12/10/2015  . Cardiomyopathy due to hypertension ([place]) 12/10/2015  . History of CVA (cerebrovascular accident) 12/10/2015  . Hyperlipidemia LDL goal <70 12/10/2015  . Statin intolerance 12/10/2015    Past Surgical History:  Procedure Laterality Date  . ABDOMINAL HYSTERECTOMY    . CHOLECYSTECTOMY    . COLONOSCOPY WITH PROPOFOL N/A 01/08/2017   Procedure: COLONOSCOPY WITH PROPOFOL;  Surgeon: [name], MD;  Location: [facility] ENDOSCOPY;  Service: Endoscopy;  Laterality: N/A;  . COLONOSCOPY WITH PROPOFOL N/A 05/05/2018   Procedure: COLONOSCOPY WITH PROPOFOL;  Surgeon: [name], MD;  Location: [facility] ENDOSCOPY;  Service: Gastroenterology;  Laterality: N/A;  . HERNIA REPAIR  [id number]   umbilical  . PORTACATH PLACEMENT N/A 11/04/2018   Procedure: INSERTION PORT-A-CATH;  Surgeon: Jules Husbands, MD;  Location: [facility] ORS;  Service: General;  Laterality: N/A;  . SENTINEL NODE BIOPSY N/A 10/06/2018   Procedure: SENTINEL NODE BIOPSY;  Surgeon: Mellody Drown, MD;  Location: [facility] ORS;  Service: Gynecology;  Laterality: N/A;  . UMBILICAL HERNIA REPAIR N/A 11/17/2017   Procedure: HERNIA REPAIR UMBILICAL ADULT;  Surgeon: Jules Husbands, MD;  Location: [facility] ORS;  Service: General;  Laterality: N/A;    Family History  Problem Relation Age  of Onset  . Stroke Mother   . Hypertension Mother   . Dementia Mother   . Alzheimer's disease Mother   . Gout Father   . Asthma Father   . Hypertension Father   . Dementia Father   . Healthy Sister   . Stroke Brother   . Alzheimer's disease Brother   . Healthy Daughter   . Hypertension Brother    . Healthy Brother   . Cancer Paternal Grandmother   . Healthy Sister   . Healthy Sister   . Hypertension Sister   . Hypertension Sister   . Stroke Brother   . Alzheimer's disease Brother   . Stroke Brother   . Hypertension Brother   . Stroke Brother   . Hypertension Brother   . Healthy Brother   . Healthy Brother   . Hypertension Daughter   . Breast cancer Neg Hx     Social History   Socioeconomic History  . Marital status: Divorced    Spouse name: Not on file  . Number of children: 2  . Years of education: some college  . Highest education level: 12th grade  Occupational History  . 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 Occupation: currently a Careers adviser  . Financial resource strain: Not hard at all  . Food insecurity    Worry: Never true    Inability: Never true  . Transportation needs    Medical: No    Non-medical: No  Tobacco Use  . Smoking status: Never Smoker  . Smokeless tobacco: Never Used  . Tobacco comment: smoking cessation materials not required  Substance and Sexual Activity  . Alcohol use: No    Alcohol/week: 0.0 standard drinks  . Drug use: No  . Sexual activity: Not Currently  Lifestyle  . Physical activity    Days per week: 1 day    Minutes per session: 20 min  . Stress: Not at all  Relationships  . Social connections    Talks on phone: More than three times a week    Gets together: Three times a week    Attends religious service: More than 4 times per year    Active member of club or organization: Yes    Attends meetings of clubs or organizations: More than 4 times per year    Relationship status: Divorced  . Intimate partner violence    Fear of current or ex partner: No    Emotionally abused: No    Physically abused: No    Forced sexual activity: No  Other Topics Concern  . Not on file  Social History Narrative  . Not on file     Current Outpatient Medications:  .  acetaminophen (TYLENOL) 500  MG tablet, Take 2 tablets (1,000 mg total) by mouth every 6 (six) hours as needed for mild pain or moderate pain., Disp: 30 tablet, Rfl: 0 .  albuterol (PROVENTIL HFA;VENTOLIN HFA) 108 (90 Base) MCG/ACT inhaler, Inhale 2 puffs into the lungs every 4 (four) hours as needed for wheezing or shortness of breath., Disp: 1 Inhaler, Rfl: 0 .  escitalopram (LEXAPRO) 10 MG tablet, Take 1 tablet (10 mg total) by mouth daily., Disp: 30 tablet, Rfl: 2 .  ezetimibe (ZETIA) 10 MG tablet, Take 1 tablet (10 mg total) by mouth daily., Disp: 30 tablet, Rfl: 2 .  furosemide (LASIX) 40 MG tablet, Take 1 tablet (40 mg total) by mouth daily as needed for  fluid., Disp: 90 tablet, Rfl: 0 .  ketoconazole (NIZORAL) 2 % cream, Apply 1 application topically daily as needed for irritation. On abdominal fold prn, Disp: 60 g, Rfl: 0 .  lidocaine-prilocaine (EMLA) cream, Apply to affected area once, Disp: 30 g, Rfl: 3 .  loperamide (IMODIUM) 2 MG capsule, Take 2 mg by mouth as needed for diarrhea or loose stools., Disp: , Rfl:  .  loratadine (CLARITIN) 10 MG tablet, Take 1 tablet by mouth daily., Disp: 30 tablet, Rfl: 4 .  magnesium oxide (MAG-OX) 400 (241.3 Mg) MG tablet, Take 1 tablet (400 mg total) by mouth daily. For hypomagnesemia, Disp: 30 tablet, Rfl: 0 .  olmesartan-hydrochlorothiazide (BENICAR HCT) 20-12.5 MG tablet, Take 1 tablet by mouth daily., Disp: 90 tablet, Rfl: 0 .  omeprazole (PRILOSEC OTC) 20 MG tablet, Take 20 mg by mouth daily as needed., Disp: , Rfl:  .  ondansetron (ZOFRAN) 8 MG tablet, Take 1 tablet (8 mg total) by mouth 2 (two) times daily as needed for refractory nausea / vomiting. Start on day 3 after chemo., Disp: 30 tablet, Rfl: 1 .  Potassium 99 MG TABS, Take 1 tablet by mouth daily., Disp: , Rfl:  .  pregabalin (LYRICA) 75 MG capsule, Take 1 capsule (75 mg total) by mouth 2 (two) times daily., Disp: 60 capsule, Rfl: 2 .  prochlorperazine (COMPAZINE) 10 MG tablet, Take 10 mg by mouth every 6 (six) hours  as needed for nausea or vomiting., Disp: , Rfl:  .  triamcinolone ointment (KENALOG) 0.5 %, Apply 1 application topically 2 (two) times daily. (Patient taking differently: Apply 1 application topically 2 (two) times daily as needed. ), Disp: 30 g, Rfl: 0 No current facility-administered medications for this visit.   Facility-Administered Medications Ordered in Other Visits:  .  sodium chloride flush (NS) 0.9 % injection 10 mL, 10 mL, Intravenous, PRN, Sindy Guadeloupe, MD, 10 mL at 11/15/18 0915 .  sodium chloride flush (NS) 0.9 % injection 10 mL, 10 mL, Intracatheter, Once PRN, Sindy Guadeloupe, MD  Allergies  Allergen Reactions  . Ferumoxytol Anaphylaxis  . Nsaids Hives and Rash  . Statins Other (See Comments)    Joint pains  . Victoza [Liraglutide] Other (See Comments)    pancreatitis  . Oxycodone Nausea And Vomiting  . Crestor [Rosuvastatin Calcium] Rash    I personally reviewed active problem list, medication list, allergies, family history, social history with the patient/caregiver today.   ROS  Ten systems reviewed and is negative except as mentioned in HPI   Objective  Virtual encounter, vitals not obtained.  There is no height or weight on file to calculate BMI.  Physical Exam  Awake, alert and oriented  PHQ2/9: Depression screen [facility] 2/9 07/27/2019 05/13/2019 04/07/2019 11/19/2018 10/20/2018  Decreased Interest 0 0 0 0 0  Down, Depressed, Hopeless 1 0 0 0 0  PHQ - 2 Score 1 0 0 0 0  Altered sleeping 0 0 - 0 0  Tired, decreased energy 1 0 - 0 0  Change in appetite 0 0 - 0 0  Feeling bad or failure about yourself  0 0 - 0 0  Trouble concentrating 0 0 - 0 0  Moving slowly or fidgety/restless 0 0 - 0 0  Suicidal thoughts 0 0 - 0 0  PHQ-9 Score 2 0 - 0 0  Difficult doing work/chores - - - Not difficult at all Not difficult at all  Some recent data might be hidden   PHQ-2/9 Result  is negative.  Grieving, just lost her father yesterday   Fall Risk: Fall Risk   07/27/2019 05/13/2019 04/07/2019 02/25/2019 01/14/2019  Falls in the past year? 0 0 0 0 0  Number falls in past yr: 0 0 0 - -  Injury with Fall? 0 0 0 - -  Risk for fall due to : - - - - -  Risk for fall due to: Comment - - - - -  Follow up Falls evaluation completed - Falls prevention discussed - -     Assessment & Plan  1. Exposure to Covid-19 Virus  Test already ordered by Dr. Janese Banks, results will likely come to me   I discussed the assessment and treatment plan with the patient. The patient was provided an opportunity to ask questions and all were answered. The patient agreed with the plan and demonstrated an understanding of the instructions.  The patient was advised to call back or seek an in-person evaluation if the symptoms worsen or if the condition fails to improve as anticipated.  I provided 15 minutes of non-face-to-face time during this encounter.

## 2019-07-27 NOTE — Patient Instructions (Signed)
Person Under Monitoring Name: Molly [name]  Location: [place] [address]   Infection Prevention Recommendations for Individuals Confirmed to have, or Being Evaluated for, 2019 Novel Coronavirus (COVID-19) Infection Who Receive Care at Home  Individuals who are confirmed to have, or are being evaluated for, COVID-19 should follow the prevention steps below until a healthcare provider or local or state health department says they can return to normal activities.  Stay home except to get medical care You should restrict activities outside your home, except for getting medical care. Do not go to work, school, or public areas, and do not use public transportation or taxis.  Call ahead before visiting your doctor Before your medical appointment, call the healthcare provider and tell them that you have, or are being evaluated for, COVID-19 infection. This will help the healthcare provider's office take steps to keep other people from getting infected. Ask your healthcare provider to call the local or state health department.  Monitor your symptoms Seek prompt medical attention if your illness is worsening (e.g., difficulty breathing). Before going to your medical appointment, call the healthcare provider and tell them that you have, or are being evaluated for, COVID-19 infection. Ask your healthcare provider to call the local or state health department.  Wear a facemask You should wear a facemask that covers your nose and mouth when you are in the same room with other people and when you visit a healthcare provider. People who live with or visit you should also wear a facemask while they are in the same room with you.  Separate yourself from other people in your home As much as possible, you should stay in a different room from other people in your home. Also, you should use a separate bathroom, if available.  Avoid sharing household items You should not  share dishes, drinking glasses, cups, eating utensils, towels, bedding, or other items with other people in your home. After using these items, you should wash them thoroughly with soap and water.  Cover your coughs and sneezes Cover your mouth and nose with a tissue when you cough or sneeze, or you can cough or sneeze into your sleeve. Throw used tissues in a lined trash can, and immediately wash your hands with soap and water for at least 20 seconds or use an alcohol-based hand rub.  Wash your [facility] your hands often and thoroughly with soap and water for at least 20 seconds. You can use an alcohol-based hand sanitizer if soap and water are not available and if your hands are not visibly dirty. Avoid touching your eyes, nose, and mouth with unwashed hands.   Prevention Steps for Caregivers and Household Members of Individuals Confirmed to have, or Being Evaluated for, COVID-19 Infection Being Cared for in the Home  If you live with, or provide care at home for, a person confirmed to have, or being evaluated for, COVID-19 infection please follow these guidelines to prevent infection:  Follow healthcare provider's instructions Make sure that you understand and can help the patient follow any healthcare provider instructions for all care.  Provide for the patient's basic needs You should help the patient with basic needs in the home and provide support for getting groceries, prescriptions, and other personal needs.  Monitor the patient's symptoms If they are getting sicker, call his or her medical provider and tell them that the patient has, or is being evaluated for, COVID-19 infection. This will help the healthcare provider's  office take steps to keep other people from getting infected. Ask the healthcare provider to call the local or state health department.  Limit the number of people who have contact with the patient  If possible, have only one caregiver for the patient.   Other household members should stay in another home or place of residence. If this is not possible, they should stay  in another room, or be separated from the patient as much as possible. Use a separate bathroom, if available.  Restrict visitors who do not have an essential need to be in the home.  Keep older adults, very young children, and other sick people away from the patient Keep older adults, very young children, and those who have compromised immune systems or chronic health conditions away from the patient. This includes people with chronic heart, lung, or kidney conditions, diabetes, and cancer.  Ensure good ventilation Make sure that shared spaces in the home have good air flow, such as from an air conditioner or an opened window, weather permitting.  Wash your hands often  Wash your hands often and thoroughly with soap and water for at least 20 seconds. You can use an alcohol based hand sanitizer if soap and water are not available and if your hands are not visibly dirty.  Avoid touching your eyes, nose, and mouth with unwashed hands.  Use disposable paper towels to dry your hands. If not available, use dedicated cloth towels and replace them when they become wet.  Wear a facemask and gloves  Wear a disposable facemask at all times in the room and gloves when you touch or have contact with the patient's blood, body fluids, and/or secretions or excretions, such as sweat, saliva, sputum, nasal mucus, vomit, urine, or feces.  Ensure the mask fits over your nose and mouth tightly, and do not touch it during use.  Throw out disposable facemasks and gloves after using them. Do not reuse.  Wash your hands immediately after removing your facemask and gloves.  If your personal clothing becomes contaminated, carefully remove clothing and launder. Wash your hands after handling contaminated clothing.  Place all used disposable facemasks, gloves, and other waste in a lined container  before disposing them with other household waste.  Remove gloves and wash your hands immediately after handling these items.  Do not share dishes, glasses, or other household items with the patient  Avoid sharing household items. You should not share dishes, drinking glasses, cups, eating utensils, towels, bedding, or other items with a patient who is confirmed to have, or being evaluated for, COVID-19 infection.  After the person uses these items, you should wash them thoroughly with soap and water.  Wash laundry thoroughly  Immediately remove and wash clothes or bedding that have blood, body fluids, and/or secretions or excretions, such as sweat, saliva, sputum, nasal mucus, vomit, urine, or feces, on them.  Wear gloves when handling laundry from the patient.  Read and follow directions on labels of laundry or clothing items and detergent. In general, wash and dry with the warmest temperatures recommended on the label.  Clean all areas the individual has used often  Clean all touchable surfaces, such as counters, tabletops, doorknobs, bathroom fixtures, toilets, phones, keyboards, tablets, and bedside tables, every day. Also, clean any surfaces that may have blood, body fluids, and/or secretions or excretions on them.  Wear gloves when cleaning surfaces the patient has come in contact with.  Use a diluted bleach solution (e.g., dilute bleach with 1  part bleach and 10 parts water) or a household disinfectant with a label that says EPA-registered for coronaviruses. To make a bleach solution at home, add 1 tablespoon of bleach to 1 quart (4 cups) of water. For a larger supply, add  cup of bleach to 1 gallon (16 cups) of water.  Read labels of cleaning products and follow recommendations provided on product labels. Labels contain instructions for safe and effective use of the cleaning product including precautions you should take when applying the product, such as wearing gloves or eye  protection and making sure you have good ventilation during use of the product.  Remove gloves and wash hands immediately after cleaning.  Monitor yourself for signs and symptoms of illness Caregivers and household members are considered close contacts, should monitor their health, and will be asked to limit movement outside of the home to the extent possible. Follow the monitoring steps for close contacts listed on the symptom monitoring form.   ? If you have additional questions, contact your local health department or call the epidemiologist on call at [phone] (available 24/7). ? This guidance is subject to change. For the most up-to-date guidance from [facility], please refer to their website: [email or web address]

## 2019-07-29 LAB — NOVEL CORONAVIRUS, NAA: SARS-CoV-2, NAA: NOT DETECTED

## 2019-08-01 ENCOUNTER — Other Ambulatory Visit: Payer: Self-pay | Admitting: Family Medicine

## 2019-08-01 DIAGNOSIS — T451X5A Adverse effect of antineoplastic and immunosuppressive drugs, initial encounter: Secondary | ICD-10-CM

## 2019-08-01 DIAGNOSIS — G620 Drug-induced polyneuropathy: Secondary | ICD-10-CM

## 2019-08-01 DIAGNOSIS — I10 Essential (primary) hypertension: Secondary | ICD-10-CM

## 2019-08-08 ENCOUNTER — Other Ambulatory Visit: Payer: Self-pay

## 2019-08-09 ENCOUNTER — Ambulatory Visit
Admission: RE | Admit: 2019-08-09 | Discharge: 2019-08-09 | Disposition: A | Discharge status: Home / Self Care | Payer: Medicare Other | Source: Ambulatory Visit | Attending: Radiation Oncology | Admitting: Radiation Oncology

## 2019-08-09 ENCOUNTER — Encounter: Payer: Self-pay | Admitting: Radiation Oncology

## 2019-08-09 ENCOUNTER — Other Ambulatory Visit: Payer: Self-pay

## 2019-08-09 VITALS — BP 124/69 | HR 64 | Temp 95.4°F | Wt 225.2 lb

## 2019-08-09 DIAGNOSIS — C541 Malignant neoplasm of endometrium: Secondary | ICD-10-CM | POA: Insufficient documentation

## 2019-08-09 DIAGNOSIS — Z923 Personal history of irradiation: Secondary | ICD-10-CM | POA: Diagnosis not present

## 2019-08-09 NOTE — Progress Notes (Signed)
Radiation Oncology Follow up Note  Name: Molly [name]   Date:   08/09/2019 MRN:  [id number] DOB: July 01, 1951    This 68 y.o. female presents to the clinic today for 1 month follow-up status post both pelvic and periaortic radiation therapy for stage IIIc endometrial carcinoma.  REFERRING PROVIDER: Steele Sizer, MD  HPI: Patient is a 68 year old female now 1 month out having completed both adjuvant chemotherapy as well as periaortic and pelvic radiation therapy for stage IIIc endometrial carcinoma.  Seen today in routine follow-up she is doing well.  She specifically denies any increased lower urinary tract symptoms diarrhea fatigue..  She had some distant exposure through her father who passed away for COVID-19 but tested negative.  She is scheduled for follow-up CT scan in September.  COMPLICATIONS OF TREATMENT: none  FOLLOW UP COMPLIANCE: keeps appointments   PHYSICAL EXAM:  BP 124/69 (BP Location: Left Arm, Patient Position: Sitting)   Pulse 64   Temp (!) 95.4 F (35.2 C) (Tympanic)   Wt 225 lb 3.2 oz (102.2 kg)   BMI 45.48 kg/m  Well-developed well-nourished patient in NAD. HEENT reveals PERLA, EOMI, discs not visualized.  Oral cavity is clear. No oral mucosal lesions are identified. Neck is clear without evidence of cervical or supraclavicular adenopathy. Lungs are clear to A&P. Cardiac examination is essentially unremarkable with regular rate and rhythm without murmur rub or thrill. Abdomen is benign with no organomegaly or masses noted. Motor sensory and DTR levels are equal and symmetric in the upper and lower extremities. Cranial nerves II through XII are grossly intact. Proprioception is intact. No peripheral adenopathy or edema is identified. No motor or sensory levels are noted. Crude visual fields are within normal range.  RADIOLOGY RESULTS: No current films for review  PLAN: Present time she is doing extremely well with no significant side effect profile.  I am pleased  with her overall progress.  I will review her follow-up CT scan which is been performed in September.  I have asked to see her back in 3 months for follow-up.  Patient knows to call with any concerns at any time.  I would like to take this opportunity to thank you for allowing me to participate in the care of your patient.Noreene Filbert, MD

## 2019-08-15 ENCOUNTER — Other Ambulatory Visit: Payer: Self-pay

## 2019-08-15 ENCOUNTER — Encounter: Payer: Self-pay | Admitting: Family Medicine

## 2019-08-15 ENCOUNTER — Ambulatory Visit (INDEPENDENT_AMBULATORY_CARE_PROVIDER_SITE_OTHER): Payer: Medicare Other | Admitting: Family Medicine

## 2019-08-15 DIAGNOSIS — I700 Atherosclerosis of aorta: Secondary | ICD-10-CM | POA: Diagnosis not present

## 2019-08-15 DIAGNOSIS — F330 Major depressive disorder, recurrent, mild: Secondary | ICD-10-CM | POA: Diagnosis not present

## 2019-08-15 DIAGNOSIS — E7800 Pure hypercholesterolemia, unspecified: Secondary | ICD-10-CM

## 2019-08-15 DIAGNOSIS — I43 Cardiomyopathy in diseases classified elsewhere: Secondary | ICD-10-CM

## 2019-08-15 DIAGNOSIS — R7303 Prediabetes: Secondary | ICD-10-CM | POA: Diagnosis not present

## 2019-08-15 DIAGNOSIS — R600 Localized edema: Secondary | ICD-10-CM

## 2019-08-15 DIAGNOSIS — Z789 Other specified health status: Secondary | ICD-10-CM

## 2019-08-15 DIAGNOSIS — R739 Hyperglycemia, unspecified: Secondary | ICD-10-CM

## 2019-08-15 DIAGNOSIS — J4520 Mild intermittent asthma, uncomplicated: Secondary | ICD-10-CM | POA: Diagnosis not present

## 2019-08-15 DIAGNOSIS — I119 Hypertensive heart disease without heart failure: Secondary | ICD-10-CM | POA: Diagnosis not present

## 2019-08-15 DIAGNOSIS — I10 Essential (primary) hypertension: Secondary | ICD-10-CM

## 2019-08-15 MED ORDER — FUROSEMIDE 40 MG PO TABS: 40.0000 mg | ORAL_TABLET | Freq: Every day | ORAL | 2 refills | Status: DC | PRN

## 2019-08-15 MED ORDER — ESCITALOPRAM OXALATE 10 MG PO TABS: 10.0000 mg | ORAL_TABLET | Freq: Every day | ORAL | 2 refills | Status: DC

## 2019-08-15 MED ORDER — EZETIMIBE 10 MG PO TABS: 10.0000 mg | ORAL_TABLET | Freq: Every day | ORAL | 2 refills | Status: DC

## 2019-08-15 NOTE — Progress Notes (Signed)
Name: Molly [name]   MRN: [id number]    DOB: 1951-11-04   Date:08/15/2019       Progress Note  Subjective  Chief Complaint  Chief Complaint  Patient presents with  . Hypertension  . Depression  . Hyperlipidemia    I connected with  [name] on 62/95/28 at 10:20 AM EDT by telephone and verified that I am speaking with the correct person using two identifiers.  I discussed the limitations, risks, security and privacy concerns of performing an evaluation and management service by telephone and the availability of in person appointments. Staff also discussed with the patient that there may be a patient responsible charge related to this service. 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   Cardiomyopathy without UXL:KGMWN extremity edema at little worse because she has been standing more and has been taking lasix more often and already started wearing compression stocking, denies orthopnea or chest pain   Endometrial cancer: s/p hysterectomy, finished the chemo and radiation therapy with Dr. Baruch Gouty .   Morbid obesity: off victoza because of episode of pancreatitis, also has prediabetes,she was on metformin but we stopped medication since diagnosed with endometrial cancer because she had nausea and was losing weight, discussed going back on medication now, but she wants to hold off for now since she is going to be working at [employer] and usually does not snack when working   Dyslipidemia: cannot tolerate statin therapy,  but is taking Zetia.   HTN:bp is at goal, no chest pain, or palpitation. Unchanged   OA: she states osteoarthritis is under control, she is not in pain, very seldom uses topical medication on her knee but doing well at this time, even though standing more at her restaurant   Atherosclerosis aorta: on zetia, because she cannot tolerate statin therapy, she denies side effects of medication, recheck labs on her next visit    Patient Active Problem List   Diagnosis Date Noted  . 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 Immunosuppressed status ([place]) 01/11/2019  . Iron deficiency anemia 11/15/2018  . Endometrial adenocarcinoma ([place]) 10/29/2018  . Umbilical hernia without obstruction and without gangrene   . Atherosclerosis of right coronary artery 11/10/2017  . Atherosclerosis of abdominal aorta ([place]) 11/10/2017  . Prediabetes 10/02/2017  . Pain in limb 03/31/2017  . Perennial allergic rhinitis with seasonal variation 05/14/2016  . Asthma, mild intermittent, well-controlled 05/14/2016  . Hypertension goal BP (blood pressure) < 140/90 12/10/2015  . Cardiomyopathy due to hypertension ([place]) 12/10/2015  . History of CVA (cerebrovascular accident) 12/10/2015  . Hyperlipidemia LDL goal <70 12/10/2015  . Statin intolerance 12/10/2015    Past Surgical History:  Procedure Laterality Date  . ABDOMINAL HYSTERECTOMY    . CHOLECYSTECTOMY    . COLONOSCOPY WITH PROPOFOL N/A 01/08/2017   Procedure: COLONOSCOPY WITH PROPOFOL;  Surgeon: [name], MD;  Location: [facility] ENDOSCOPY;  Service: Endoscopy;  Laterality: N/A;  . COLONOSCOPY WITH PROPOFOL N/A 05/05/2018   Procedure: COLONOSCOPY WITH PROPOFOL;  Surgeon: [name], MD;  Location[facility] END[facility]COPY;  Service: Gastroenterology;  Laterality: N/A;  . HERNIA REPAIR  08/3817   umbilical  . PORTACATH PLACEMENT N/A 11/04/2018   Procedure: INSERTION PORT-A-CATH;  Surgeon: Jules Husbands, MD;   Location: [facility] ORS;  Service: General;  Laterality: N/A;  . SENTINEL NODE BIOPSY N/A 10/06/2018   Procedure: SENTINEL NODE BIOPSY;  Surgeon: Mellody Drown, MD;  Location: [facility] ORS;  Service: Gynecology;  Laterality: N/A;  . UMBILICAL HERNIA REPAIR N/A 11/17/2017   Procedure: HERNIA REPAIR UMBILICAL ADULT;  Surgeon: Jules Husbands, MD;  Location: [facility] ORS;  Service: General;  Laterality: N/A;    Family History  Problem Relation Age of Onset  . Stroke Mother   . Hypertension Mother   . Dementia Mother   . Alzheimer's disease Mother   . Gout Father   . Asthma Father   . Hypertension Father   . Dementia Father   . Healthy Sister   . Stroke Brother   . Alzheimer's disease Brother   . Healthy Daughter   . Hypertension Brother   . Healthy Brother   . Cancer Paternal Grandmother   . Healthy Sister   . Healthy Sister   . Hypertension Sister   . Hypertension Sister   . Stroke Brother   . Alzheimer's disease Brother   . Stroke Brother   . Hypertension Brother   . Stroke Brother   . Hypertension Brother   . Healthy Brother   . Healthy Brother   . Hypertension Daughter   . Breast cancer Neg Hx     Social History   Socioeconomic History  . Marital status: Divorced    Spouse name: Not on file  . Number of children: 2  . Years of education: some college  . Highest education level: 12th grade  Occupational History  . 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 Occupation: currently a Careers adviser  . Financial resource strain: Not hard at all  . Food insecurity    Worry: Never true    Inability: Never true  . Transportation needs    Medical: No    Non-medical: No  Tobacco Use  . Smoking status: Never Smoker  . Smokeless tobacco: Never Used  . Tobacco comment: smoking cessation materials not required  Substance and Sexual Activity  . Alcohol use: No    Alcohol/week: 0.0 standard drinks  . Drug use: No  . Sexual activity: Not Currently   Lifestyle  . Physical activity    Days per week: 0 days    Minutes per session: 0 min  . Stress: Not at all  Relationships  . Social connections    Talks on phone: More than three times a week    Gets together: Three times a week    Attends religious service: More than 4 times per year    Active member of club or organization: Yes    Attends meetings of clubs or organizations: More than 4 times per year    Relationship status: Divorced  . Intimate partner violence    Fear of current or ex partner: No    Emotionally abused: No    Physically abused: No    Forced sexual activity: No  Other Topics Concern  . Not on file  Social History Narrative  . Not on file     Current Outpatient Medications:  .  acetaminophen (TYLENOL) 500 MG tablet, Take 2 tablets (  1,000 mg total) by mouth every 6 (six) hours as needed for mild pain or moderate pain., Disp: 30 tablet, Rfl: 0 .  albuterol (PROVENTIL HFA;VENTOLIN HFA) 108 (90 Base) MCG/ACT inhaler, Inhale 2 puffs into the lungs every 4 (four) hours as needed for wheezing or shortness of breath., Disp: 1 Inhaler, Rfl: 0 .  escitalopram (LEXAPRO) 10 MG tablet, Take 1 tablet (10 mg total) by mouth daily., Disp: 30 tablet, Rfl: 2 .  ezetimibe (ZETIA) 10 MG tablet, Take 1 tablet (10 mg total) by mouth daily., Disp: 30 tablet, Rfl: 2 .  furosemide (LASIX) 40 MG tablet, Take 1 tablet (40 mg total) by mouth daily as needed for fluid., Disp: 90 tablet, Rfl: 0 .  ketoconazole (NIZORAL) 2 % cream, Apply 1 application topically daily as needed for irritation. On abdominal fold prn, Disp: 60 g, Rfl: 0 .  lidocaine-prilocaine (EMLA) cream, Apply to affected area once, Disp: 30 g, Rfl: 3 .  loperamide (IMODIUM) 2 MG capsule, Take 2 mg by mouth as needed for diarrhea or loose stools., Disp: , Rfl:  .  loratadine (CLARITIN) 10 MG tablet, Take 1 tablet by mouth daily., Disp: 30 tablet, Rfl: 4 .  magnesium oxide (MAG-OX) 400 (241.3 Mg) MG tablet, Take 1 tablet (400  mg total) by mouth daily. For hypomagnesemia, Disp: 30 tablet, Rfl: 0 .  olmesartan-hydrochlorothiazide (BENICAR HCT) 20-12.5 MG tablet, Take 1 tablet by mouth daily., Disp: 90 tablet, Rfl: 1 .  omeprazole (PRILOSEC OTC) 20 MG tablet, Take 20 mg by mouth daily as needed., Disp: , Rfl:  .  ondansetron (ZOFRAN) 8 MG tablet, Take 1 tablet (8 mg total) by mouth 2 (two) times daily as needed for refractory nausea / vomiting. Start on day 3 after chemo., Disp: 30 tablet, Rfl: 1 .  Potassium 99 MG TABS, Take 1 tablet by mouth daily., Disp: , Rfl:  .  pregabalin (LYRICA) 75 MG capsule, Take 1 capsule by mouth twice daily., Disp: 60 capsule, Rfl: 3 .  prochlorperazine (COMPAZINE) 10 MG tablet, Take 10 mg by mouth every 6 (six) hours as needed for nausea or vomiting., Disp: , Rfl:  .  triamcinolone ointment (KENALOG) 0.5 %, Apply 1 application topically 2 (two) times daily. (Patient not taking: Reported on 08/09/2019), Disp: 30 g, Rfl: 0 No current facility-administered medications for this visit.   Facility-Administered Medications Ordered in Other Visits:  .  sodium chloride flush (NS) 0.9 % injection 10 mL, 10 mL, Intravenous, PRN, Sindy Guadeloupe, MD, 10 mL at 11/15/18 0915 .  sodium chloride flush (NS) 0.9 % injection 10 mL, 10 mL, Intracatheter, Once PRN, Sindy Guadeloupe, MD  Allergies  Allergen Reactions  . Ferumoxytol Anaphylaxis  . Nsaids Hives and Rash  . Statins Other (See Comments)    Joint pains  . Victoza [Liraglutide] Other (See Comments)    pancreatitis  . Oxycodone Nausea And Vomiting  . Crestor [Rosuvastatin Calcium] Rash    I personally reviewed active problem list, medication list, allergies, family history, social history with the patient/caregiver today.   ROS  Ten systems reviewed and is negative except as mentioned in HPI   Objective  Virtual encounter, vitals not obtained.  There is no height or weight on file to calculate BMI.  Physical Exam  Awake, alert and  oriented  PHQ2/9: Depression screen [facility] 2/9 08/15/2019 07/27/2019 05/13/2019 04/07/2019 11/19/2018  Decreased Interest 0 0 0 0 0  Down, Depressed, Hopeless 0 1 0 0 0  PHQ - 2  Score 0 1 0 0 0  Altered sleeping 0 0 0 - 0  Tired, decreased energy 0 1 0 - 0  Change in appetite 0 0 0 - 0  Feeling bad or failure about yourself  0 0 0 - 0  Trouble concentrating 0 0 0 - 0  Moving slowly or fidgety/restless 0 0 0 - 0  Suicidal thoughts 0 0 0 - 0  PHQ-9 Score 0 2 0 - 0  Difficult doing work/chores - - - - Not difficult at all  Some recent data might be hidden   PHQ-2/9 Result is negative.    Fall Risk: Fall Risk  07/27/2019 05/13/2019 04/07/2019 02/25/2019 01/14/2019  Falls in the past year? 0 0 0 0 0  Number falls in past yr: 0 0 0 - -  Injury with Fall? 0 0 0 - -  Risk for fall due to : - - - - -  Risk for fall due to: Comment - - - - -  Follow up Falls evaluation completed - Falls prevention discussed - -     Assessment & Plan  1. Mild recurrent major depression (HCC)  - escitalopram (LEXAPRO) 10 MG tablet; Take 1 tablet (10 mg total) by mouth daily.  Dispense: 30 tablet; Refill: 2  2. Atherosclerosis of abdominal aorta (HCC)  - ezetimibe (ZETIA) 10 MG tablet; Take 1 tablet (10 mg total) by mouth daily.  Dispense: 30 tablet; Refill: 2  3. Mild intermittent asthma without complication   4. Prediabetes  Check A1C   5. Cardiomyopathy due to hypertension, without heart failure (HCC)  - furosemide (LASIX) 40 MG tablet; Take 1 tablet (40 mg total) by mouth daily as needed for fluid.  Dispense: 30 tablet; Refill: 2  6. Statin intolerance   7. Hyperglycemia  - Hemoglobin A1c  8. Pure hypercholesterolemia  - Lipid panel  9. Morbid obesity ([place])  She is becoming more active  10. Hypertension goal BP (blood pressure) < 140/90  - furosemide (LASIX) 40 MG tablet; Take 1 tablet (40 mg total) by mouth daily as needed for fluid.  Dispense: 30 tablet; Refill: 2  11. Bilateral  lower extremity edema  - furosemide (LASIX) 40 MG tablet; Take 1 tablet (40 mg total) by mouth daily as needed for fluid.  Dispense: 30 tablet; Refill: 2  I discussed the assessment and treatment plan with the patient. The patient was provided an opportunity to ask questions and all were answered. The patient agreed with the plan and demonstrated an understanding of the instructions.   The patient was advised to call back or seek an in-person evaluation if the symptoms worsen or if the condition fails to improve as anticipated.  I provided 25  minutes of non-face-to-face time during this encounter.  Loistine Chance, MD

## 2019-08-17 ENCOUNTER — Encounter: Payer: Self-pay | Admitting: Obstetrics and Gynecology

## 2019-08-17 ENCOUNTER — Ambulatory Visit (INDEPENDENT_AMBULATORY_CARE_PROVIDER_SITE_OTHER): Payer: Medicare Other | Admitting: Obstetrics and Gynecology

## 2019-08-17 ENCOUNTER — Other Ambulatory Visit: Payer: Self-pay

## 2019-08-17 VITALS — BP 121/70 | HR 56 | Ht 59.0 in | Wt 222.6 lb

## 2019-08-17 DIAGNOSIS — Z1231 Encounter for screening mammogram for malignant neoplasm of breast: Secondary | ICD-10-CM | POA: Diagnosis not present

## 2019-08-17 DIAGNOSIS — C541 Malignant neoplasm of endometrium: Secondary | ICD-10-CM

## 2019-08-17 NOTE — Addendum Note (Signed)
Addended by: Augusto Gamble on: 08/17/2019 08:02 PM   Modules accepted: Level of Service

## 2019-08-17 NOTE — Progress Notes (Signed)
Pt is present for a follow up for ovarian cancer. Pt stated that she was doing well no problems.

## 2019-08-17 NOTE — Progress Notes (Signed)
    GYNECOLOGY PROGRESS NOTE  Subjective:    Patient ID: Molly [name], female    DOB: 1951-12-12, 68 y.o.   MRN: [id number]  HPI  Patient is a 68 y.o. G60P2002 female who presents for 6 month follow up of endometrial cancer.  She was diagnosed with endometrial cancer in September after several bouts of post-menopausal bleeding with pessary use for pelvic organ prolapse. She underwent a total laparoscopic hysterectomy with bilateral salpingo-oophorectomy with pelvic lymph node dissection on 10/06/2018.  She has undergone radiation and chemotherapy.   Final pathology showed: Endometrioid carcinoma FIGO grade 2 with 33% myometrial invasion.  Lymphovascular invasion present.  Peritoneal/ascites fluid atypical.  2 out of 3 sentinel lymph nodes were positive for macro metastases.  pT1a pN1a.  FIGO IIIC1.   Patient today reports complaints of a small bump that has appeared on her labia, non-tender, non-painful. Notes that she is excited that her hair is growing back.  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 She does report that she did experience the loss of her father 2-3 weeks ago. He was 95, passed due to bowel obstruction, inoperable due to age. Notes she is grieving appropriately, has good family support.    The following portions of the patient's history were reviewed and updated as appropriate: allergies, current medications, past family history, past medical history, past social history, past surgical history and problem list.  Review of Systems Pertinent items noted in HPI and remainder of comprehensive ROS otherwise negative.   Objective:   Blood pressure 121/70, pulse (!) 56, height 4\' 11"  (1.499 m), weight 222 lb 9.6 oz (101 kg).  Body mass index is 44.96 kg/m.   General appearance: alert and no distress.  Abdomen: soft, non-tender; bowel sounds normal; no masses,  no organomegaly. Large well-healed scar across right  mid-abdomen.  Pelvic: external genitalia normal, small hair follicle bump noted  Rectovaginal septum normal.  Vagina without discharge.  No lesions or nodularity noted. Uterus and cervix surgically absent. Rectum: Normal external sphincter, normal tone, no masses.  Lymph nodes: Cervical, supraclavicular, and inguinal nodes normal. Extremities: extremities normal, atraumatic, no cyanosis or edema Neurologic: Grossly normal   Assessment:   Endometrial cancer s/p LAVH/BSO with lymph node dissection. Currently s/p radiation treatment, currently undergoing chemotherapy.  Breast cancer screening  Plan:   - Doing well. No evidence of disease noted on today's exam. Will need to continue q 3-4 month follow up alternating with Oncology and GYN.   - Patient overdue for mammogram.  Will order.    Rubie Maid, MD [facility]

## 2019-08-18 ENCOUNTER — Telehealth: Payer: Self-pay

## 2019-08-18 NOTE — Telephone Encounter (Signed)
Telephone call to patient to discuss Survivorship Care Plan visit.  Packet mailed to patient with SCP, treatment summary and ASCOanswers booklet.  Patient states she will call and schedule a time for me and APP to call her to review SCP

## 2019-08-25 ENCOUNTER — Other Ambulatory Visit: Payer: Self-pay

## 2019-08-26 ENCOUNTER — Inpatient Hospital Stay: Payer: Medicare Other | Attending: Oncology | Admitting: Oncology

## 2019-08-26 DIAGNOSIS — C541 Malignant neoplasm of endometrium: Secondary | ICD-10-CM | POA: Diagnosis not present

## 2019-08-26 NOTE — Progress Notes (Signed)
Survivorship Care Plan visit completed.  Treatment summary previously mailed and given to patient.  ASCO answers booklet reviewed and given to patient.  CARE program and Cancer Transitions discussed with patient along with other resources cancer center offers to patients and caregivers.  Patient verbalized understanding.

## 2019-08-26 NOTE — Progress Notes (Signed)
CLINIC:  Survivorship   REASON FOR VISIT:  Routine follow-up post-treatment for a recent history of endometrial cancer  BRIEF ONCOLOGIC HISTORY:  Oncology History  Endometrial adenocarcinoma (Molly [name])  10/28/2018 Cancer Staging   Staging form: Corpus Uteri - Carcinoma and Carcinosarcoma, AJCC 8th Edition - Clinical stage from 10/28/2018: FIGO Stage IIIC1 (cT1a, cN1a, cM0) - Signed by Sindy Guadeloupe, MD on 10/29/2018   10/29/2018 Initial Diagnosis   Endometrial adenocarcinoma (Molly [name])   11/05/2018 -  Chemotherapy   The patient had palonosetron (ALOXI) injection 0.25 mg, 0.25 mg, Intravenous,  Once, 6 of 6 cycles Administration: 0.25 mg (11/05/2018), 0.25 mg (11/29/2018), 0.25 mg (12/20/2018), 0.25 mg (01/14/2019), 0.25 mg (02/04/2019), 0.25 mg (02/25/2019) pegfilgrastim (NEULASTA ONPRO KIT) injection 6 mg, 6 mg, Subcutaneous, Once, 2 of 2 cycles Administration: 6 mg (11/05/2018), 6 mg (11/29/2018) CARBOplatin (PARAPLATIN) 540 mg in sodium chloride 0.9 % 250 mL chemo infusion, 540 mg (100 % of original dose 540 mg), Intravenous,  Once, 6 of 6 cycles Dose modification:   (original dose 540 mg, Cycle 1) Administration: 540 mg (11/05/2018), 540 mg (11/29/2018), 540 mg (12/20/2018), 540 mg (01/14/2019), 540 mg (02/04/2019), 540 mg (02/25/2019) PACLitaxel (TAXOL) 348 mg in sodium chloride 0.9 % 500 mL chemo infusion (> 52m/m2), 175 mg/m2 = 348 mg, Intravenous,  Once, 6 of 6 cycles Dose modification: 150 mg/m2 (original dose 175 mg/m2, Cycle 4, Reason: Other (see comments), Comment: neuropathy) Administration: 348 mg (11/05/2018), 348 mg (11/29/2018), 348 mg (12/20/2018), 300 mg (01/14/2019), 300 mg (02/04/2019), 300 mg (02/25/2019)  for chemotherapy treatment.      INTERVAL HISTORY:  [name] to review her survivorship care plan detailing her treatment course for endometrial cancer,  as well as monitoring long-term side effects of that treatment,  education regarding health maintenance, screening, and overall wellness and health promotion.     Patient is a 68year old female who follows up with Dr. CMarcelline Matesfor cystocele and rectocele. She was noted to have postmenopausal bleeding and cramping recently. Ultrasound revealed heterogeneous appearance of uterus with fibroids invading the endometrium. She underwent an endometrial biopsy when she had a second bout of postmenopausal bleeding. Biopsy showed endometrioid adenocarcinoma FIGO grade 1. She was then seen by Dr. BFransisca Connorsand plan was to proceed with laparoscopic hysterectomy and bilateral salpingo-oophorectomy along with pelvic lymph node sampling  Final pathology showed: Endometrioid carcinoma FIGO grade 2 with 33% myometrial invasion. Lymphovascular invasion present. Peritoneal/ascites fluid atypical. 2 out of 3 sentinel lymph nodes were positive for macro metastases. pT1a pN1a. FIGO IIIC1  Patient's case was discussed at tumor board and consensus was to proceed with 3 cycles of adjuvant carbotaxol followed by radiation followed by 3 more cycles of chemotherapy. Patient is already met with Dr. CDonella [name] will be giving radiation therapy to pelvic and periaortic lymph nodes and boost to her vaginal cuff. PET CT scan showed metastatic adenopathy and retroperitoneal and external iliac group of lymph nodes in the right side. No evidence of metastatic disease elsewhere.  First cycle of chemotherapy was given on 11/05/2018.She completed 6 cycles on 02/25/2019.  She completed whole pelvic radiation in June 2020.  REVIEW OF SYSTEMS:  Review of Systems  Constitutional: Positive for fatigue.  Neurological: Positive for numbness.    ONCOLOGY TREATMENT TEAM:  1. Surgeon:  Dr. BFransisca [name] 2. Medical Oncologist: Dr. RJanese Banks3. Radiation Oncologist: Dr. CBaruch [name]   PAST MEDICAL/SURGICAL HISTORY:  Past Medical History:  Diagnosis Date  .  Anxiety   . Asthma    history of asthma  .  Cardiomyopathy ([place])   . Complication of anesthesia    tore hair out and made teeth rough  . Depression   . Endometrial cancer ([place]) 08/2018  . GERD (gastroesophageal reflux disease)    takes prilosec prn  . History of CVA (cerebrovascular accident) 12/10/2015  . Hyperlipidemia LDL goal <70 12/10/2015  . Hypertension   . Prediabetes 10/02/2017   A1c 6 in January 2018  . Stroke [facility]) 1990    no residual effects   Past Surgical History:  Procedure Laterality Date  . ABDOMINAL HYSTERECTOMY    . CHOLECYSTECTOMY    . COLONOSCOPY WITH PROPOFOL N/A 01/08/2017   Procedure: COLONOSCOPY WITH PROPOFOL;  Surgeon: Molly Bellows, MD;  Location: [facility] ENDOSCOPY;  Service: Endoscopy;  Laterality: N/A;  . COLONOSCOPY WITH PROPOFOL N/A 05/05/2018   Procedure: COLONOSCOPY WITH PROPOFOL;  Surgeon: Molly Bellows, MD;  Location: [facility] ENDOSCOPY;  Service: Gastroenterology;  Laterality: N/A;  . HERNIA REPAIR  08/6282   umbilical  . PORTACATH PLACEMENT N/A 11/04/2018   Procedure: INSERTION PORT-A-CATH;  Surgeon: Molly Husbands, MD;  Location: [facility] ORS;  Service: General;  Laterality: N/A;  . SENTINEL NODE BIOPSY N/A 10/06/2018   Procedure: SENTINEL NODE BIOPSY;  Surgeon: Molly Drown, MD;  Location: [facility] ORS;  Service: Gynecology;  Laterality: N/A;  . UMBILICAL HERNIA REPAIR N/A 11/17/2017   Procedure: HERNIA REPAIR UMBILICAL ADULT;  Surgeon: Molly Husbands, MD;  Location: [facility] ORS;  Service: General;  Laterality: N/A;     ALLERGIES:  Allergies  Allergen Reactions  . Ferumoxytol Anaphylaxis  . Nsaids Hives and Rash  . Statins Other (See Comments)    Joint pains  . Victoza [Liraglutide] Other (See Comments)    pancreatitis  . Oxycodone Nausea And Vomiting  . Crestor [Rosuvastatin Calcium] Rash     CURRENT MEDICATIONS:  Outpatient Encounter Medications as of 08/26/2019  Medication Sig  . acetaminophen (TYLENOL) 500 MG tablet Take 2 tablets (1,000 mg total) by mouth every 6 (six) hours as needed for  mild pain or moderate pain.  Marland Kitchen albuterol (PROVENTIL HFA;VENTOLIN HFA) 108 (90 Base) MCG/ACT inhaler Inhale 2 puffs into the lungs every 4 (four) hours as needed for wheezing or shortness of breath.  . escitalopram (LEXAPRO) 10 MG tablet Take 1 tablet (10 mg total) by mouth daily.  Marland Kitchen ezetimibe (ZETIA) 10 MG tablet Take 1 tablet (10 mg total) by mouth daily.  . furosemide (LASIX) 40 MG tablet Take 1 tablet (40 mg total) by mouth daily as needed for fluid.  Marland Kitchen ketoconazole (NIZORAL) 2 % cream Apply 1 application topically daily as needed for irritation. On abdominal fold prn  . lidocaine-prilocaine (EMLA) cream Apply to affected area once  . loratadine (CLARITIN) 10 MG tablet Take 1 tablet by mouth daily.  . magnesium oxide (MAG-OX) 400 (241.3 Mg) MG tablet Take 1 tablet (400 mg total) by mouth daily. For hypomagnesemia  . olmesartan-hydrochlorothiazide (BENICAR HCT) 20-12.5 MG tablet Take 1 tablet by mouth daily.  Marland Kitchen omeprazole (PRILOSEC OTC) 20 MG tablet Take 20 mg by mouth daily as needed.  . ondansetron (ZOFRAN) 8 MG tablet Take 1 tablet (8 mg total) by mouth 2 (two) times daily as needed for refractory nausea / vomiting. Start on day 3 after chemo.  Marland Kitchen Potassium 99 MG TABS Take 1 tablet by mouth daily.  . pregabalin (LYRICA) 75 MG capsule Take 1 capsule by mouth twice daily.  . prochlorperazine (COMPAZINE)  10 MG tablet Take 10 mg by mouth every 6 (six) hours as needed for nausea or vomiting.   Facility-Administered Encounter Medications as of 08/26/2019  Medication  . sodium chloride flush (NS) 0.9 % injection 10 mL  . sodium chloride flush (NS) 0.9 % injection 10 mL     ONCOLOGIC FAMILY HISTORY:  Family History  Problem Relation Age of Onset  . Stroke Mother   . Hypertension Mother   . Dementia Mother   . Alzheimer's disease Mother   . Gout Father   . Asthma Father   . Hypertension Father   . Dementia Father   . Healthy Sister   . Stroke Brother   . Alzheimer's disease Brother   .  Healthy Daughter   . Hypertension Brother   . Healthy Brother   . Cancer Paternal Grandmother   . Healthy Sister   . Healthy Sister   . Hypertension Sister   . Hypertension Sister   . Stroke Brother   . Alzheimer's disease Brother   . Stroke Brother   . Hypertension Brother   . Stroke Brother   . Hypertension Brother   . Healthy Brother   . Healthy Brother   . Hypertension Daughter   . Breast cancer Neg Hx      GENETIC COUNSELING/TESTING:   SOCIAL HISTORY:  Molly [name] is single and lives alone in [place], [place].  She has (2) children and they live in [place] and [place], [place].  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  She denies any current or history of tobacco, alcohol, or illicit drug use.    PHYSICAL EXAMINATION:  Vital Signs:  There were no vitals filed for this visit. There were no vitals filed for this visit.  Liimited  LABORATORY DATA:  Lab Results  Component Value Date   WBC 5.3 07/18/2019   HGB 10.7 (L) 07/18/2019   HCT 32.1 (L) 07/18/2019   MCV 94.1 07/18/2019   PLT 199 07/18/2019     Chemistry      Component Value Date/Time   NA 141 07/18/2019 0918   K 3.5 07/18/2019 0918   CL 104 07/18/2019 0918   CO2 28 07/18/2019 0918   BUN 15 07/18/2019 0918   CREATININE 0.73 07/18/2019 0918   CREATININE 0.96 09/07/2018 1642      Component Value Date/Time   CALCIUM 9.1 07/18/2019 0918   ALKPHOS 71 07/18/2019 0918   AST 24 07/18/2019 0918   ALT 18 07/18/2019 0918   BILITOT 0.7 07/18/2019 0918      DIAGNOSTIC IMAGING:  CT abdomen/pelvis 03/04/19  IMPRESSION: 1. Interval decrease in size of left periaortic and external iliac adenopathy. 2. Unchanged 2 mm pulmonary nodule. Recommend attention on follow-up. 3. Unchanged stranding within the omentum without discrete nodularity. Recommend attention on follow-up.   ASSESSMENT AND PLAN:   Molly [name] is a pleasant 68 y.o. female with Stage endometrial carcinoma, diagnosed in November 2019.She completed 6 cycles of Carbo/taxol on 02/25/2019.  She completed whole pelvic radiation in June 2020.  She presents to the Survivorship Clinic for our initial meeting and routine follow-up post-completion of treatment for endometrial cancer.    1. Stage II endometrial cancer-:  Molly [name] is continuing to recover from definitive treatment for endometrial cancer. She will follow-up with her medical oncologist, Dr. Janese [name] on 09/19/19 with history and physical exam per surveillance protocol.  We discussed that this  may include a physical exam every 3-6 months for 2-3 years, then 6 months - 1 year thereafter.  Blood work and imaging will be performed as clinically indicated. Today, a comprehensive survivorship care plan and treatment summary was reviewed with the patient detailing her endometrial cancer diagnosis, treatment course, potential late-long-term effects of treatment, appropriate follow-up care with recommendations for the future, and patient education resources.  A copy of this summary, along with a letter, will be sent to the patient's primary care provider via in-basket.  #. Problem(s) at Visit: Neuropathy; patient has asked to restart acupuncture.  States her neuropathy is mild and almost completely resolved with previous acupuncture.  She has spoken to [facility] and appointments have been resumed.  At this time she is not currently on medication.  #. Obesity: We discussed that obese and overweight woman her 2-4 times this likely is normal weight woman to develop endometrial cancer and extremely obese woman her approximately 7 times is likely to develop endometrial cancer.  We also discussed that obesity may worsen several aspects of cancer survivorship including quality of life, cancer recurrence, cancer progression, and prognosis.  We discussed that a normal BMI is 18.5-24.9 kg/m2. Her  current BMI is 44.94.  #. Sexual health: Not sexually active  #. Bone health: Her last DEXA scan was 02/02/2017, which showed T-score -1.7.  In the meantime, she was encouraged to increase her consumption of foods rich in calcium, as well as increase her weight-bearing activities.  She was given education on specific activities to promote bone health. Continue to encourage supplementation of Calcium 129m and Vitamin D 800 iu.   #. Cancer screening:  Due to Molly [name]'s history and her age, she should receive screening for skin cancers, colon cancer, and gynecologic cancers.  The information and recommendations are listed on the patient's comprehensive care plan/treatment summary and were reviewed in detail with the patient.    #. Health maintenance and wellness promotion: Ms. TWinegarwas encouraged to consume 5-7 servings of fruits and vegetables per day. We reviewed the "Nutrition Rainbow" handout, as well as the handout "Take Control of Your Health and Reduce Your Cancer Risk" from the [facility]  She was also encouraged to engage in moderate to vigorous exercise for 30 minutes per day most days of the week. We discussed the [employer] fitness program, which is designed for cancer survivors to help them become more physically fit after cancer treatments.  She was instructed to limit her alcohol consumption and continue to abstain from tobacco use.     #. Support services/counseling: It is not uncommon for this period of the patient's cancer care trajectory to be one of many emotions and stressors.  We discussed an opportunity for her to participate in the next session of [facility]Finding Your New Normal") support group series designed for patients after they have completed treatment.   Ms. TCrakerwas encouraged to take advantage of our many other support services programs, support groups, and/or counseling in coping with her new life as a cancer survivor after completing anti-cancer  treatment.  She was offered support today through active listening and expressive supportive counseling.  She was given information regarding our available services and encouraged to contact me with any questions or for help enrolling in any of our support group/programs.    Dispo:   -Return to cancer center on 09/16/19 for CT scan and on 09/19/19 for evaluation with Dr. RJanese [name]   -Follow up with surgery/gynecology oncology  on 10/26/19.  -Mammogram due ASAP. Last on 02/02/2017. Scheduled by PCP Dr. Ancil Boozer. - DEXA scan due q 2 years. Scheduled by Dr. Ancil Boozer. -She is welcome to return back to the Survivorship Clinic at any time; no additional follow-up needed at this time.  -Consider referral back to survivorship as a long-term survivor for continued surveillance  A total of (30) minutes of face-to-face time was spent with this patient with greater than 50% of that time in counseling and care-coordination.   [name], NP AGNP-C [place] [facility] [place]   Note: PRIMARY CARE PROVIDER Steele Sizer, [place] [phone]

## 2019-09-08 ENCOUNTER — Other Ambulatory Visit
Admission: RE | Admit: 2019-09-08 | Discharge: 2019-09-08 | Disposition: A | Discharge status: Home / Self Care | Payer: Medicare Other | Source: Ambulatory Visit | Attending: Family Medicine | Admitting: Family Medicine

## 2019-09-08 ENCOUNTER — Encounter: Payer: Self-pay | Admitting: Family Medicine

## 2019-09-08 ENCOUNTER — Other Ambulatory Visit: Payer: Self-pay

## 2019-09-08 ENCOUNTER — Ambulatory Visit
Admission: RE | Admit: 2019-09-08 | Discharge: 2019-09-08 | Disposition: A | Discharge status: Home / Self Care | Payer: Medicare Other | Source: Ambulatory Visit | Attending: Family Medicine | Admitting: Family Medicine

## 2019-09-08 ENCOUNTER — Other Ambulatory Visit: Payer: Self-pay | Admitting: Family Medicine

## 2019-09-08 ENCOUNTER — Ambulatory Visit (INDEPENDENT_AMBULATORY_CARE_PROVIDER_SITE_OTHER): Payer: Medicare Other | Admitting: Family Medicine

## 2019-09-08 DIAGNOSIS — R739 Hyperglycemia, unspecified: Secondary | ICD-10-CM | POA: Diagnosis not present

## 2019-09-08 DIAGNOSIS — R062 Wheezing: Secondary | ICD-10-CM

## 2019-09-08 DIAGNOSIS — I10 Essential (primary) hypertension: Secondary | ICD-10-CM | POA: Insufficient documentation

## 2019-09-08 DIAGNOSIS — R05 Cough: Secondary | ICD-10-CM | POA: Diagnosis not present

## 2019-09-08 DIAGNOSIS — E785 Hyperlipidemia, unspecified: Secondary | ICD-10-CM

## 2019-09-08 DIAGNOSIS — U071 COVID-19: Secondary | ICD-10-CM

## 2019-09-08 DIAGNOSIS — R059 Cough, unspecified: Secondary | ICD-10-CM

## 2019-09-08 DIAGNOSIS — Z20822 Contact with and (suspected) exposure to covid-19: Secondary | ICD-10-CM

## 2019-09-08 DIAGNOSIS — R6889 Other general symptoms and signs: Secondary | ICD-10-CM | POA: Diagnosis not present

## 2019-09-08 HISTORY — DX: COVID-19: U07.1

## 2019-09-08 LAB — LIPID PANEL
Cholesterol: 211 mg/dL — ABNORMAL HIGH (ref 0–200)
HDL: 61 mg/dL (ref >40)
LDL Cholesterol: 130 mg/dL — ABNORMAL HIGH (ref 0–99)
Total CHOL/HDL Ratio: 3.5 RATIO
Triglycerides: 102 mg/dL (ref <150)
VLDL: 20 mg/dL (ref 0–40)

## 2019-09-08 LAB — COMPREHENSIVE METABOLIC PANEL
ALT: 50 U/L — ABNORMAL HIGH (ref 0–44)
AST: 52 U/L — ABNORMAL HIGH (ref 15–41)
Albumin: 3.8 g/dL (ref 3.5–5.0)
Alkaline Phosphatase: 110 U/L (ref 38–126)
Anion gap: 8 (ref 5–15)
BUN: 11 mg/dL (ref 8–23)
CO2: 27 mmol/L (ref 22–32)
Calcium: 9.2 mg/dL (ref 8.9–10.3)
Chloride: 105 mmol/L (ref 98–111)
Creatinine, Ser: 0.71 mg/dL (ref 0.44–1.00)
GFR calc Af Amer: >60 mL/min (ref >60)
GFR calc non Af Amer: >60 mL/min (ref >60)
Glucose, Bld: 105 mg/dL — ABNORMAL HIGH (ref 70–99)
Potassium: 3.9 mmol/L (ref 3.5–5.1)
Sodium: 140 mmol/L (ref 135–145)
Total Bilirubin: 0.6 mg/dL (ref 0.3–1.2)
Total Protein: 6.9 g/dL (ref 6.5–8.1)

## 2019-09-08 LAB — CBC WITH DIFFERENTIAL/PLATELET
Abs Immature Granulocytes: 0.02 10*3/uL (ref 0.00–0.07)
Basophils Absolute: 0 10*3/uL (ref 0.0–0.1)
Basophils Relative: 0 %
Eosinophils Absolute: 0.1 10*3/uL (ref 0.0–0.5)
Eosinophils Relative: 2 %
HCT: 32.6 % — ABNORMAL LOW (ref 36.0–46.0)
Hemoglobin: 10.5 g/dL — ABNORMAL LOW (ref 12.0–15.0)
Immature Granulocytes: 1 %
Lymphocytes Relative: 14 %
Lymphs Abs: 0.6 10*3/uL — ABNORMAL LOW (ref 0.7–4.0)
MCH: 30.7 pg (ref 26.0–34.0)
MCHC: 32.2 g/dL (ref 30.0–36.0)
MCV: 95.3 fL (ref 80.0–100.0)
Monocytes Absolute: 0.6 10*3/uL (ref 0.1–1.0)
Monocytes Relative: 14 %
Neutro Abs: 3 10*3/uL (ref 1.7–7.7)
Neutrophils Relative %: 69 %
Platelets: 232 10*3/uL (ref 150–400)
RBC: 3.42 MIL/uL — ABNORMAL LOW (ref 3.87–5.11)
RDW: 12.8 % (ref 11.5–15.5)
WBC: 4.4 10*3/uL (ref 4.0–10.5)
nRBC: 0 % (ref 0.0–0.2)

## 2019-09-08 MED ORDER — PREDNISONE 10 MG PO TABS: 10.0000 mg | ORAL_TABLET | Freq: Every day | ORAL | 0 refills | Status: DC

## 2019-09-08 MED ORDER — BENZONATATE 100 MG PO CAPS: 100.0000 mg | ORAL_CAPSULE | Freq: Two times a day (BID) | ORAL | 0 refills | Status: DC | PRN

## 2019-09-08 NOTE — Progress Notes (Signed)
Name: Molly [name]   MRN: [id number]    DOB: [birth year]   Date:09/08/2019       Progress Note  Subjective  Chief Complaint  Chief Complaint  Patient presents with  . Sore Throat    Onset-Tuesday  . Cough    Wednesday started coughing and states she has been breathing hard. Coughing up phlegm  . Wheezing    I connected with  [name] on [id number] at 11:20 AM EDT by telephone and verified that I am speaking with the correct person using two identifiers.  I discussed the limitations, risks, security and privacy concerns of performing an evaluation and management service by telephone and the availability of in person appointments. Staff also discussed with the patient that there may be a patient responsible charge related to this service. Patient Location: in the care, son in law is driving her back from hospital, had labs done Provider Location: [facility]   HPI  Cough: she states symptoms started two days ago , initially had a sore throat, followed by a wet cough - she is not sure of color os sputum, woke up multiple times during the night with cough and wheezing. This morning. Denies fever or chills. She has noticed lack of appetite No lack of smell or taste, no nausea, vomiting or diarrhea. No rashes. She feels worse today with some SOB and chest congestion. She denies lower extremity edema or orthopnea. She took Terraflu without help of symptoms. No sick contacts that she is aware of .    Patient Active Problem List   Diagnosis Date Noted  . 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 Immunosuppressed status ([place]) 01/11/2019  . Iron deficiency anemia 11/15/2018  . Endometrial adenocarcinoma ([place]) 10/29/2018  . Umbilical hernia without obstruction and without gangrene   . Atherosclerosis of right coronary artery 11/10/2017  . Atherosclerosis of abdominal aorta ([place]) 11/10/2017  . Prediabetes  10/02/2017  . Pain in limb 03/31/2017  . Perennial allergic rhinitis with seasonal variation 05/14/2016  . Asthma, mild intermittent, well-controlled 05/14/2016  . Hypertension goal BP (blood pressure) < 140/90 12/10/2015  . Cardiomyopathy due to hypertension ([place]) 12/10/2015  . History of CVA (cerebrovascular accident) 12/10/2015  . Hyperlipidemia LDL goal <70 12/10/2015  . Statin intolerance 12/10/2015    Past Surgical History:  Procedure Laterality Date  . ABDOMINAL HYSTERECTOMY    . CHOLECYSTECTOMY    . COLONOSCOPY WITH PROPOFOL N/A 01/08/2017   Procedure: COLONOSCOPY WITH PROPOFOL;  Surgeon: [name], MD;  Location: [facility] ENDOSCOPY;  Service: Endoscopy;  Laterality: N/A;  . COLONOSCOPY WITH PROPOFOL N/A 05/05/2018   Procedure: COLONOSCOPY WITH PROPOFOL;  Surgeon: [name], MD;  Location[facility] ENDOSCOPY;  Service: Gastroenterology;  Laterality: N/A;  . HERNIA REPAIR  [id number]   umbilical  . PORTACATH PLACEMENT N/A 11/04/2018   Procedure: INSERTION PORT-A-CATH;  Surgeon: Jules Husbands, MD;  Location: [facility] ORS;  Service: General;  Laterality: N/A;  . SENTINEL NODE BIOPSY N/A 10/06/2018   Procedure: SENTINEL NODE BIOPSY;  Surgeon: Mellody Drown, MD;  Location: [facility] ORS;  Service: Gynecology;  Laterality: N/A;  . UMBILICAL HERNIA REPAIR N/A 11/17/2017   Procedure: HERNIA REPAIR UMBILICAL ADULT;  Surgeon: Jules Husbands, MD;  Location: [facility] ORS;  Service: General;  Laterality: N/A;    Family History  Problem Relation Age of Onset  . Stroke Mother   . Hypertension Mother   . Dementia Mother   .  Alzheimer's disease Mother   . Gout Father   . Asthma Father   . Hypertension Father   . Dementia Father   . Healthy Sister   . Stroke Brother   . Alzheimer's disease Brother   . Healthy Daughter   . Hypertension Brother   . Healthy Brother   . Cancer Paternal Grandmother   . Healthy Sister   . Healthy Sister   . Hypertension Sister   . Hypertension Sister   . Stroke Brother    . Alzheimer's disease Brother   . Stroke Brother   . Hypertension Brother   . Stroke Brother   . Hypertension Brother   . Healthy Brother   . Healthy Brother   . Hypertension Daughter   . Breast cancer Neg Hx     Social History   Socioeconomic History  . Marital status: Divorced    Spouse name: Not on file  . Number of children: 2  . Years of education: some college  . Highest education level: 12th grade  Occupational History  . 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 Occupation: currently a Careers adviser  . Financial resource strain: Not hard at all  . Food insecurity    Worry: Never true    Inability: Never true  . Transportation needs    Medical: No    Non-medical: No  Tobacco Use  . Smoking status: Never Smoker  . Smokeless tobacco: Never Used  . Tobacco comment: smoking cessation materials not required  Substance and Sexual Activity  . Alcohol use: No    Alcohol/week: 0.0 standard drinks  . Drug use: No  . Sexual activity: Not Currently  Lifestyle  . Physical activity    Days per week: 0 days    Minutes per session: 0 min  . Stress: Not at all  Relationships  . Social connections    Talks on phone: More than three times a week    Gets together: Three times a week    Attends religious service: More than 4 times per year    Active member of club or organization: Yes    Attends meetings of clubs or organizations: More than 4 times per year    Relationship status: Divorced  . Intimate partner violence    Fear of current or ex partner: No    Emotionally abused: No    Physically abused: No    Forced sexual activity: No  Other Topics Concern  . Not on file  Social History Narrative  . Not on file     Current Outpatient Medications:  .  acetaminophen (TYLENOL) 500 MG tablet, Take 2 tablets (1,000 mg total) by mouth every 6 (six) hours as needed for mild pain or moderate pain., Disp: 30 tablet, Rfl: 0 .  albuterol (PROVENTIL  HFA;VENTOLIN HFA) 108 (90 Base) MCG/ACT inhaler, Inhale 2 puffs into the lungs every 4 (four) hours as needed for wheezing or shortness of breath., Disp: 1 Inhaler, Rfl: 0 .  escitalopram (LEXAPRO) 10 MG tablet, Take 1 tablet (10 mg total) by mouth daily., Disp: 30 tablet, Rfl: 2 .  ezetimibe (ZETIA) 10 MG tablet, Take 1 tablet (10 mg total) by mouth daily., Disp: 30 tablet, Rfl: 2 .  furosemide (LASIX) 40 MG tablet, Take 1 tablet (40 mg total) by mouth daily as needed for fluid., Disp: 30 tablet, Rfl: 2 .  ketoconazole (NIZORAL) 2 % cream, Apply 1 application topically daily as  needed for irritation. On abdominal fold prn, Disp: 60 g, Rfl: 0 .  lidocaine-prilocaine (EMLA) cream, Apply to affected area once, Disp: 30 g, Rfl: 3 .  loratadine (CLARITIN) 10 MG tablet, Take 1 tablet by mouth daily., Disp: 30 tablet, Rfl: 4 .  magnesium oxide (MAG-OX) 400 (241.3 Mg) MG tablet, Take 1 tablet (400 mg total) by mouth daily. For hypomagnesemia, Disp: 30 tablet, Rfl: 0 .  olmesartan-hydrochlorothiazide (BENICAR HCT) 20-12.5 MG tablet, Take 1 tablet by mouth daily., Disp: 90 tablet, Rfl: 1 .  omeprazole (PRILOSEC OTC) 20 MG tablet, Take 20 mg by mouth daily as needed., Disp: , Rfl:  .  ondansetron (ZOFRAN) 8 MG tablet, Take 1 tablet (8 mg total) by mouth 2 (two) times daily as needed for refractory nausea / vomiting. Start on day 3 after chemo., Disp: 30 tablet, Rfl: 1 .  Potassium 99 MG TABS, Take 1 tablet by mouth daily., Disp: , Rfl:  .  pregabalin (LYRICA) 75 MG capsule, Take 1 capsule by mouth twice daily., Disp: 60 capsule, Rfl: 3 .  prochlorperazine (COMPAZINE) 10 MG tablet, Take 10 mg by mouth every 6 (six) hours as needed for nausea or vomiting., Disp: , Rfl:  No current facility-administered medications for this visit.   Facility-Administered Medications Ordered in Other Visits:  .  sodium chloride flush (NS) 0.9 % injection 10 mL, 10 mL, Intravenous, PRN, Sindy Guadeloupe, MD, 10 mL at 11/15/18 0915  .  sodium chloride flush (NS) 0.9 % injection 10 mL, 10 mL, Intracatheter, Once PRN, Sindy Guadeloupe, MD  Allergies  Allergen Reactions  . Ferumoxytol Anaphylaxis  . Nsaids Hives and Rash  . Statins Other (See Comments)    Joint pains  . Victoza [Liraglutide] Other (See Comments)    pancreatitis  . Oxycodone Nausea And Vomiting  . Crestor [Rosuvastatin Calcium] Rash    I personally reviewed active problem list, medication list, allergies, family history, social history, health maintenance with the patient/caregiver today.   ROS  Ten systems reviewed and is negative except as mentioned in HPI  Objective  Virtual encounter, vitals not obtained.  There is no height or weight on file to calculate BMI.  Physical Exam  Awake, alert and oriented  PHQ2/9: Depression screen [facility] 2/9 09/08/2019 08/15/2019 07/27/2019 05/13/2019 04/07/2019  Decreased Interest 0 0 0 0 0  Down, Depressed, Hopeless 0 0 1 0 0  PHQ - 2 Score 0 0 1 0 0  Altered sleeping 0 0 0 0 -  Tired, decreased energy 0 0 1 0 -  Change in appetite 0 0 0 0 -  Feeling bad or failure about yourself  0 0 0 0 -  Trouble concentrating 0 0 0 0 -  Moving slowly or fidgety/restless 0 0 0 0 -  Suicidal thoughts 0 0 0 0 -  PHQ-9 Score 0 0 2 0 -  Difficult doing work/chores - - - - -  Some recent data might be hidden   PHQ-2/9 Result is negative.    Fall Risk: Fall Risk  09/08/2019 07/27/2019 05/13/2019 04/07/2019 02/25/2019  Falls in the past year? 0 0 0 0 0  Number falls in past yr: 0 0 0 0 -  Injury with Fall? 0 0 0 0 -  Risk for fall due to : - - - - -  Risk for fall due to: Comment - - - - -  Follow up - Falls evaluation completed - Falls prevention discussed -    Assessment &  Plan  1. Cough  - benzonatate (TESSALON) 100 MG capsule; Take 1-2 capsules (100-200 mg total) by mouth 2 (two) times daily as needed.  Dispense: 40 capsule; Refill:    2. Wheezing  She has a history of asthma, explained since acute onset it may  be a viral illness, we will check labs since she is high risk patient and CXR depending on results we will call in either prednisone or antibiotics. Avoid sedating medications in case it is COVID-19 and self isolate  I discussed the assessment and treatment plan with the patient. The patient was provided an opportunity to ask questions and all were answered. The patient agreed with the plan and demonstrated an understanding of the instructions.   The patient was advised to call back or seek an in-person evaluation if the symptoms worsen or if the condition fails to improve as anticipated.  I provided 25  minutes of non-face-to-face time during this encounter.  Loistine Chance, MD

## 2019-09-09 LAB — NOVEL CORONAVIRUS, NAA: SARS-CoV-2, NAA: DETECTED — AB

## 2019-09-09 LAB — HEMOGLOBIN A1C
Hgb A1c MFr Bld: 5.7 % — ABNORMAL HIGH (ref 4.8–5.6)
Mean Plasma Glucose: 117 mg/dL

## 2019-09-13 ENCOUNTER — Encounter: Payer: Self-pay | Admitting: Oncology

## 2019-09-16 ENCOUNTER — Other Ambulatory Visit: Payer: Medicare Other

## 2019-09-16 ENCOUNTER — Inpatient Hospital Stay: Payer: Medicare Other

## 2019-09-16 ENCOUNTER — Ambulatory Visit: Admission: RE | Admit: 2019-09-16 | Payer: Medicare Other | Source: Ambulatory Visit

## 2019-09-19 ENCOUNTER — Other Ambulatory Visit: Payer: Medicare Other

## 2019-09-19 ENCOUNTER — Inpatient Hospital Stay: Payer: Medicare Other | Admitting: Oncology

## 2019-09-22 ENCOUNTER — Ambulatory Visit: Payer: Medicare Other | Admitting: Oncology

## 2019-10-04 ENCOUNTER — Other Ambulatory Visit: Payer: Self-pay

## 2019-10-05 ENCOUNTER — Inpatient Hospital Stay: Payer: Medicare Other | Attending: Oncology

## 2019-10-05 ENCOUNTER — Ambulatory Visit
Admission: RE | Admit: 2019-10-05 | Discharge: 2019-10-05 | Disposition: A | Discharge status: Home / Self Care | Payer: Medicare Other | Source: Ambulatory Visit | Attending: Oncology | Admitting: Oncology

## 2019-10-05 ENCOUNTER — Other Ambulatory Visit: Payer: Self-pay

## 2019-10-05 ENCOUNTER — Ambulatory Visit (INDEPENDENT_AMBULATORY_CARE_PROVIDER_SITE_OTHER): Payer: Medicare Other

## 2019-10-05 ENCOUNTER — Other Ambulatory Visit: Payer: Self-pay | Admitting: *Deleted

## 2019-10-05 DIAGNOSIS — D649 Anemia, unspecified: Secondary | ICD-10-CM | POA: Insufficient documentation

## 2019-10-05 DIAGNOSIS — F419 Anxiety disorder, unspecified: Secondary | ICD-10-CM | POA: Diagnosis not present

## 2019-10-05 DIAGNOSIS — Z23 Encounter for immunization: Secondary | ICD-10-CM | POA: Diagnosis not present

## 2019-10-05 DIAGNOSIS — Z8673 Personal history of transient ischemic attack (TIA), and cerebral infarction without residual deficits: Secondary | ICD-10-CM | POA: Diagnosis not present

## 2019-10-05 DIAGNOSIS — C541 Malignant neoplasm of endometrium: Secondary | ICD-10-CM

## 2019-10-05 DIAGNOSIS — Z79899 Other long term (current) drug therapy: Secondary | ICD-10-CM | POA: Diagnosis not present

## 2019-10-05 DIAGNOSIS — Z90722 Acquired absence of ovaries, bilateral: Secondary | ICD-10-CM | POA: Insufficient documentation

## 2019-10-05 DIAGNOSIS — F329 Major depressive disorder, single episode, unspecified: Secondary | ICD-10-CM | POA: Insufficient documentation

## 2019-10-05 DIAGNOSIS — Z90710 Acquired absence of both cervix and uterus: Secondary | ICD-10-CM | POA: Insufficient documentation

## 2019-10-05 DIAGNOSIS — R918 Other nonspecific abnormal finding of lung field: Secondary | ICD-10-CM | POA: Insufficient documentation

## 2019-10-05 DIAGNOSIS — I10 Essential (primary) hypertension: Secondary | ICD-10-CM | POA: Insufficient documentation

## 2019-10-05 DIAGNOSIS — J45909 Unspecified asthma, uncomplicated: Secondary | ICD-10-CM | POA: Diagnosis not present

## 2019-10-05 DIAGNOSIS — E876 Hypokalemia: Secondary | ICD-10-CM | POA: Insufficient documentation

## 2019-10-05 DIAGNOSIS — Z923 Personal history of irradiation: Secondary | ICD-10-CM | POA: Diagnosis not present

## 2019-10-05 DIAGNOSIS — Z95828 Presence of other vascular implants and grafts: Secondary | ICD-10-CM

## 2019-10-05 DIAGNOSIS — Z8619 Personal history of other infectious and parasitic diseases: Secondary | ICD-10-CM | POA: Diagnosis not present

## 2019-10-05 DIAGNOSIS — Z9221 Personal history of antineoplastic chemotherapy: Secondary | ICD-10-CM | POA: Diagnosis not present

## 2019-10-05 LAB — CBC WITH DIFFERENTIAL/PLATELET
Abs Immature Granulocytes: 0.02 10*3/uL (ref 0.00–0.07)
Basophils Absolute: 0 10*3/uL (ref 0.0–0.1)
Basophils Relative: 0 %
Eosinophils Absolute: 0 10*3/uL (ref 0.0–0.5)
Eosinophils Relative: 1 %
HCT: 29.5 % — ABNORMAL LOW (ref 36.0–46.0)
Hemoglobin: 9.9 g/dL — ABNORMAL LOW (ref 12.0–15.0)
Immature Granulocytes: 1 %
Lymphocytes Relative: 27 %
Lymphs Abs: 1.1 10*3/uL (ref 0.7–4.0)
MCH: 31.2 pg (ref 26.0–34.0)
MCHC: 33.6 g/dL (ref 30.0–36.0)
MCV: 93.1 fL (ref 80.0–100.0)
Monocytes Absolute: 0.4 10*3/uL (ref 0.1–1.0)
Monocytes Relative: 11 %
Neutro Abs: 2.4 10*3/uL (ref 1.7–7.7)
Neutrophils Relative %: 60 %
Platelets: 218 10*3/uL (ref 150–400)
RBC: 3.17 MIL/uL — ABNORMAL LOW (ref 3.87–5.11)
RDW: 12.7 % (ref 11.5–15.5)
WBC: 4 10*3/uL (ref 4.0–10.5)
nRBC: 0 % (ref 0.0–0.2)

## 2019-10-05 LAB — COMPREHENSIVE METABOLIC PANEL
ALT: 15 U/L (ref 0–44)
AST: 19 U/L (ref 15–41)
Albumin: 3.7 g/dL (ref 3.5–5.0)
Alkaline Phosphatase: 74 U/L (ref 38–126)
Anion gap: 8 (ref 5–15)
BUN: 15 mg/dL (ref 8–23)
CO2: 28 mmol/L (ref 22–32)
Calcium: 9.1 mg/dL (ref 8.9–10.3)
Chloride: 105 mmol/L (ref 98–111)
Creatinine, Ser: 0.71 mg/dL (ref 0.44–1.00)
GFR calc Af Amer: >60 mL/min (ref >60)
GFR calc non Af Amer: >60 mL/min (ref >60)
Glucose, Bld: 97 mg/dL (ref 70–99)
Potassium: 3.2 mmol/L — ABNORMAL LOW (ref 3.5–5.1)
Sodium: 141 mmol/L (ref 135–145)
Total Bilirubin: 0.6 mg/dL (ref 0.3–1.2)
Total Protein: 6.8 g/dL (ref 6.5–8.1)

## 2019-10-05 MED ORDER — IOHEXOL 300 MG/ML  SOLN
100.0000 mL | Freq: Once | INTRAMUSCULAR | Status: AC | PRN
  Administered 2019-10-05: 100 mL via INTRAVENOUS

## 2019-10-05 MED ORDER — SODIUM CHLORIDE 0.9% FLUSH
10.0000 mL | Freq: Once | INTRAVENOUS | Status: AC
  Administered 2019-10-05: 10 mL via INTRAVENOUS
  Filled 2019-10-05: qty 10

## 2019-10-05 MED ORDER — HEPARIN SOD (PORK) LOCK FLUSH 100 UNIT/ML IV SOLN
500.0000 [IU] | Freq: Once | INTRAVENOUS | Status: AC
  Administered 2019-10-05: 500 [IU] via INTRAVENOUS

## 2019-10-06 LAB — CA 125: Cancer Antigen (CA) 125: 9.6 U/mL (ref 0.0–38.1)

## 2019-10-07 ENCOUNTER — Encounter: Payer: Self-pay | Admitting: Oncology

## 2019-10-07 ENCOUNTER — Telehealth: Payer: Self-pay | Admitting: *Deleted

## 2019-10-07 ENCOUNTER — Other Ambulatory Visit: Payer: Self-pay

## 2019-10-07 ENCOUNTER — Inpatient Hospital Stay (HOSPITAL_BASED_OUTPATIENT_CLINIC_OR_DEPARTMENT_OTHER): Payer: Medicare Other | Admitting: Oncology

## 2019-10-07 ENCOUNTER — Other Ambulatory Visit: Payer: Medicare Other

## 2019-10-07 VITALS — BP 134/59 | HR 63 | Temp 98.0°F | Ht 59.0 in | Wt 226.0 lb

## 2019-10-07 DIAGNOSIS — D649 Anemia, unspecified: Secondary | ICD-10-CM | POA: Diagnosis not present

## 2019-10-07 DIAGNOSIS — Z08 Encounter for follow-up examination after completed treatment for malignant neoplasm: Secondary | ICD-10-CM

## 2019-10-07 DIAGNOSIS — Z923 Personal history of irradiation: Secondary | ICD-10-CM | POA: Diagnosis not present

## 2019-10-07 DIAGNOSIS — Z79899 Other long term (current) drug therapy: Secondary | ICD-10-CM | POA: Diagnosis not present

## 2019-10-07 DIAGNOSIS — Z8673 Personal history of transient ischemic attack (TIA), and cerebral infarction without residual deficits: Secondary | ICD-10-CM | POA: Diagnosis not present

## 2019-10-07 DIAGNOSIS — C541 Malignant neoplasm of endometrium: Secondary | ICD-10-CM | POA: Diagnosis not present

## 2019-10-07 DIAGNOSIS — I10 Essential (primary) hypertension: Secondary | ICD-10-CM | POA: Diagnosis not present

## 2019-10-07 DIAGNOSIS — Z8542 Personal history of malignant neoplasm of other parts of uterus: Secondary | ICD-10-CM

## 2019-10-07 DIAGNOSIS — E876 Hypokalemia: Secondary | ICD-10-CM | POA: Diagnosis not present

## 2019-10-07 DIAGNOSIS — J45909 Unspecified asthma, uncomplicated: Secondary | ICD-10-CM | POA: Diagnosis not present

## 2019-10-07 DIAGNOSIS — D638 Anemia in other chronic diseases classified elsewhere: Secondary | ICD-10-CM

## 2019-10-07 DIAGNOSIS — Z9221 Personal history of antineoplastic chemotherapy: Secondary | ICD-10-CM | POA: Diagnosis not present

## 2019-10-07 DIAGNOSIS — Z8619 Personal history of other infectious and parasitic diseases: Secondary | ICD-10-CM | POA: Diagnosis not present

## 2019-10-07 DIAGNOSIS — R918 Other nonspecific abnormal finding of lung field: Secondary | ICD-10-CM | POA: Diagnosis not present

## 2019-10-07 NOTE — Telephone Encounter (Signed)
Called pt to let her know that she can have her port removed on 10/19 3:45 at Dr. Dahlia Byes office. She states that is good and she will it on my chart

## 2019-10-07 NOTE — Progress Notes (Signed)
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 Marcelline Mates for cystocele and rectocele. She was noted to have postmenopausal bleeding and cramping recently. Ultrasound revealed heterogeneous appearance of uterus with fibroids invading the endometrium. She underwent an endometrial biopsy when she had a second bout of postmenopausal bleeding. Biopsy showed endometrioid adenocarcinoma FIGO grade 1. She was then seen by Dr. Fransisca Connors and plan was to proceed with laparoscopic hysterectomy and bilateral salpingo-oophorectomy along with pelvic lymph node sampling  Final pathology showed: Endometrioid carcinoma FIGO grade 2 with 33% myometrial invasion. Lymphovascular invasion present. Peritoneal/ascites fluid atypical. 2 out of 3 sentinel lymph nodes were positive for macro metastases. pT1a pN1a. FIGO IIIC1  Patient's case was discussed at tumor board and consensus was to proceed with 3 cycles of adjuvant carbotaxol followed by radiation followed by 3 more cycles of chemotherapy. Patient is already met with Dr. Donella Stade who will be giving radiation therapy to pelvic and periaortic lymph nodes and boost to her vaginal cuff. PET CT  scan showed metastatic adenopathy and retroperitoneal and external iliac group of lymph nodes in the right side. No evidence of metastatic disease elsewhere.  First cycle of chemotherapy was given on 11/05/2018.She completed 6 cycles on 02/25/2019.  She completed whole pelvic radiation in June 2020.  Interval history-she recovered from covid a month ago. Still feels tired. This morning she had a dizzy spell and reports seeing some blood in the toilet bowl  ECOG PS- 1 Pain scale- 0   Review of systems- Review of Systems  Constitutional: Positive for malaise/fatigue. Negative for chills, fever and weight loss.  HENT: Negative for congestion, ear discharge and nosebleeds.   Eyes: Negative for blurred vision.  Respiratory: Negative for cough, hemoptysis, sputum production, shortness of breath and wheezing.   Cardiovascular: Negative for chest pain, palpitations, orthopnea and claudication.  Gastrointestinal: Positive for blood in stool. Negative for abdominal pain, constipation, diarrhea, heartburn, melena, nausea and vomiting.  Genitourinary: Negative for dysuria, flank pain, frequency, hematuria and urgency.  Musculoskeletal: Negative for back pain, joint pain and myalgias.  Skin: Negative for rash.  Neurological: Negative for dizziness, tingling, focal weakness, seizures, weakness and headaches.  Endo/Heme/Allergies: Does not bruise/bleed easily.  Psychiatric/Behavioral: Negative for depression and suicidal ideas. The patient does not have insomnia.        Allergies  Allergen Reactions   Ferumoxytol Anaphylaxis   Nsaids Hives and Rash   Statins Other (See Comments)    Joint pains   Victoza [Liraglutide] Other (See Comments)    pancreatitis   Oxycodone Nausea And Vomiting   Crestor [Rosuvastatin Calcium] Rash     Past Medical History:  Diagnosis Date   Anxiety    Asthma    history of asthma   Cardiomyopathy ([place])    Complication of anesthesia    tore hair out  and made teeth  rough   Depression    Endometrial cancer ([place]) 08/2018   GERD (gastroesophageal reflux disease)    takes prilosec prn   History of CVA (cerebrovascular accident) 12/10/2015   Hyperlipidemia LDL goal <70 12/10/2015   Hypertension    Prediabetes 10/02/2017   A1c 6 in January 2018   Stroke [facility] 1990    no residual effects     Past Surgical History:  Procedure Laterality Date   ABDOMINAL HYSTERECTOMY     CHOLECYSTECTOMY     COLONOSCOPY WITH PROPOFOL N/A 01/08/2017   Procedure: COLONOSCOPY WITH PROPOFOL;  Surgeon: [name], MD;  Location: [facility] ENDOSCOPY;  Service: Endoscopy;  Laterality: N/A;   COLONOSCOPY WITH PROPOFOL N/A 05/05/2018   Procedure: COLONOSCOPY WITH PROPOFOL;  Surgeon: [name], MD;  Location: [facility] ENDOSCOPY;  Service: Gastroenterology;  Laterality: N/A;   HERNIA REPAIR  81/0175   umbilical   PORTACATH PLACEMENT N/A 11/04/2018   Procedure: INSERTION PORT-A-CATH;  Surgeon: Jules Husbands, MD;  Location: [facility] ORS;  Service: General;  Laterality: N/A;   SENTINEL NODE BIOPSY N/A 10/06/2018   Procedure: SENTINEL NODE BIOPSY;  Surgeon: Mellody Drown, MD;  Location: [facility] ORS;  Service: Gynecology;  Laterality: N/A;   UMBILICAL HERNIA REPAIR N/A 11/17/2017   Procedure: HERNIA REPAIR UMBILICAL ADULT;  Surgeon: Jules Husbands, MD;  Location: [facility] ORS;  Service: General;  Laterality: N/A;    Social History   Socioeconomic History   Marital status: Divorced    Spouse name: Not on file   Number of children: 2   Years of education: some college   Highest education level: 12th grade  Occupational History   Occupation: Retired    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   Occupation: currently a Biomedical engineer strain: Not hard at [employer] insecurity    Worry: Never true    Inability: Never true   Transportation needs    Medical: No    Non-medical: No  Tobacco Use   Smoking status: Never Smoker    Smokeless tobacco: Never Used   Tobacco comment: smoking cessation materials not required  Substance and Sexual Activity   Alcohol use: No    Alcohol/week: 0.0 standard drinks   Drug use: No   Sexual activity: Not Currently  Lifestyle   Physical activity    Days per week: 0 days    Minutes per session: 0 min   Stress: Not at all  Relationships   Social connections    Talks on phone: More than three times a week    Gets together: Three times a week    Attends religious service: More than 4 times per year    Active member of club or organization: Yes    Attends meetings of clubs or organizations: More than 4 times per year    Relationship status: Divorced   Intimate partner violence    Fear of current or ex partner: No    Emotionally abused: No    Physically abused: No    Forced sexual activity: No  Other Topics Concern   Not on file  Social History Narrative   Not on file    Family History  Problem Relation Age of Onset   Stroke Mother    Hypertension Mother    Dementia Mother    Alzheimer's disease Mother    Gout Father    Asthma Father    Hypertension Father    Dementia Father  Healthy Sister    Stroke Brother    Alzheimer's disease Brother    Healthy Daughter    Hypertension Brother    Healthy Brother    Cancer Paternal 22    Healthy Sister    Healthy Sister    Hypertension Sister    Hypertension Sister    Stroke Brother    Alzheimer's disease Brother    Stroke Brother    Hypertension Brother    Stroke Brother    Hypertension Brother    Healthy Brother    Healthy Brother    Hypertension Daughter    Breast cancer Neg Hx      Current Outpatient Medications:    acetaminophen (TYLENOL) 500 MG tablet, Take 2 tablets (1,000 mg total) by mouth every 6 (six) hours as needed for mild pain or moderate pain., Disp: 30 tablet, Rfl: 0   albuterol (PROVENTIL HFA;VENTOLIN HFA) 108 (90 Base) MCG/ACT inhaler,  Inhale 2 puffs into the lungs every 4 (four) hours as needed for wheezing or shortness of breath., Disp: 1 Inhaler, Rfl: 0   benzonatate (TESSALON) 100 MG capsule, Take 1-2 capsules (100-200 mg total) by mouth 2 (two) times daily as needed., Disp: 40 capsule, Rfl: 0   escitalopram (LEXAPRO) 10 MG tablet, Take 1 tablet (10 mg total) by mouth daily., Disp: 30 tablet, Rfl: 2   ezetimibe (ZETIA) 10 MG tablet, Take 1 tablet (10 mg total) by mouth daily., Disp: 30 tablet, Rfl: 2   furosemide (LASIX) 40 MG tablet, Take 1 tablet (40 mg total) by mouth daily as needed for fluid., Disp: 30 tablet, Rfl: 2   ketoconazole (NIZORAL) 2 % cream, Apply 1 application topically daily as needed for irritation. On abdominal fold prn, Disp: 60 g, Rfl: 0   lidocaine-prilocaine (EMLA) cream, Apply to affected area once, Disp: 30 g, Rfl: 3   loratadine (CLARITIN) 10 MG tablet, Take 1 tablet by mouth daily., Disp: 30 tablet, Rfl: 4   magnesium oxide (MAG-OX) 400 (241.3 Mg) MG tablet, Take 1 tablet (400 mg total) by mouth daily. For hypomagnesemia, Disp: 30 tablet, Rfl: 0   olmesartan-hydrochlorothiazide (BENICAR HCT) 20-12.5 MG tablet, Take 1 tablet by mouth daily., Disp: 90 tablet, Rfl: 1   omeprazole (PRILOSEC OTC) 20 MG tablet, Take 20 mg by mouth daily as needed., Disp: , Rfl:    ondansetron (ZOFRAN) 8 MG tablet, Take 1 tablet (8 mg total) by mouth 2 (two) times daily as needed for refractory nausea / vomiting. Start on day 3 after chemo., Disp: 30 tablet, Rfl: 1   Potassium 99 MG TABS, Take 1 tablet by mouth daily., Disp: , Rfl:    predniSONE (DELTASONE) 10 MG tablet, Take 1-2 tablets (10-20 mg total) by mouth daily with breakfast. Two in am and one in pm, Disp: 15 tablet, Rfl: 0   pregabalin (LYRICA) 75 MG capsule, Take 1 capsule by mouth twice daily., Disp: 60 capsule, Rfl: 3   prochlorperazine (COMPAZINE) 10 MG tablet, Take 10 mg by mouth every 6 (six) hours as needed for nausea or vomiting., Disp: ,  Rfl:  No current facility-administered medications for this visit.   Facility-Administered Medications Ordered in Other Visits:    sodium chloride flush (NS) 0.9 % injection 10 mL, 10 mL, Intravenous, PRN, Sindy Guadeloupe, MD, 10 mL at 11/15/18 0915   sodium chloride flush (NS) 0.9 % injection 10 mL, 10 mL, Intracatheter, Once PRN, Sindy Guadeloupe, MD  Physical exam:  Vitals:   10/07/19 0952  BP: Marland Kitchen)  134/59  Pulse: 63  Temp: 98 F (36.7 C)  TempSrc: Tympanic  Weight: 226 lb (102.5 kg)  Height: 4' 11"  (1.499 m)   Physical Exam Constitutional:      General: She is not in acute distress.    Appearance: She is obese.  HENT:     Head: Normocephalic and atraumatic.  Eyes:     Pupils: Pupils are equal, round, and reactive to light.  Neck:     Musculoskeletal: Normal range of motion.  Cardiovascular:     Rate and Rhythm: Normal rate and regular rhythm.     Heart sounds: Normal heart sounds.  Pulmonary:     Effort: Pulmonary effort is normal.     Breath sounds: Normal breath sounds.  Abdominal:     General: Bowel sounds are normal.     Palpations: Abdomen is soft.  Skin:    General: Skin is warm and dry.  Neurological:     Mental Status: She is alert and oriented to person, place, and time.      CMP Latest Ref Rng & Units 10/05/2019  Glucose 70 - 99 mg/dL 97  BUN 8 - 23 mg/dL 15  Creatinine 0.44 - 1.00 mg/dL 0.71  Sodium 135 - 145 mmol/L 141  Potassium 3.5 - 5.1 mmol/L 3.2(L)  Chloride 98 - 111 mmol/L 105  CO2 22 - 32 mmol/L 28  Calcium 8.9 - 10.3 mg/dL 9.1  Total Protein 6.5 - 8.1 g/dL 6.8  Total Bilirubin 0.3 - 1.2 mg/dL 0.6  Alkaline Phos 38 - 126 U/L 74  AST 15 - 41 U/L 19  ALT 0 - 44 U/L 15   CBC Latest Ref Rng & Units 10/05/2019  WBC 4.0 - 10.5 K/uL 4.0  Hemoglobin 12.0 - 15.0 g/dL 9.9(L)  Hematocrit 36.0 - 46.0 % 29.5(L)  Platelets 150 - 400 K/uL 218    No images are attached to the encounter.  Dg Chest 2 View  Result Date: 09/08/2019 CLINICAL DATA:   Cough and wheezing EXAM: CHEST - 2 VIEW COMPARISON:  November 04, 2018 FINDINGS: Port-A-Cath tip is in the superior vena cava. There is no edema or consolidation. Heart size and pulmonary vascularity are normal. No adenopathy. No bone lesions. IMPRESSION: No edema or consolidation. No adenopathy. Port-A-Cath tip in superior vena cava. Electronically Signed   By: Lowella Grip III M.D.   On: 09/08/2019 16:08   Ct Chest W Contrast  Result Date: 10/05/2019 CLINICAL DATA:  Endometrial cancer diagnosed last year. Asymptomatic. EXAM: CT CHEST, ABDOMEN, AND PELVIS WITH CONTRAST TECHNIQUE: Multidetector CT imaging of the chest, abdomen and pelvis was performed following the standard protocol during bolus administration of intravenous contrast. CONTRAST:  171m OMNIPAQUE IOHEXOL 300 MG/ML  SOLN COMPARISON:  03/04/2019 FINDINGS: CT CHEST FINDINGS Cardiovascular: Aortic atherosclerosis. Mild cardiomegaly. Multivessel coronary artery atherosclerosis. No central pulmonary embolism, on this non-dedicated study. Right Port-A-Cath tip at superior caval/atrial junction. Mediastinum/Nodes: No supraclavicular adenopathy. No mediastinal or hilar adenopathy. Tiny hiatal hernia. Lungs/Pleura: No pleural fluid. 2 mm lingular nodule is similar on 82/3. Right greater than left non masslike, relatively linear airspace and ground-glass opacity. Musculoskeletal: No acute osseous abnormality. CT ABDOMEN PELVIS FINDINGS Hepatobiliary: Normal liver. Cholecystectomy, without biliary ductal dilatation. Pancreas: Normal, without mass or ductal dilatation. Spleen: Normal in size, without focal abnormality. Adrenals/Urinary Tract: Normal adrenal glands. Mild lower pole left renal scarring. Normal right kidney. No hydronephrosis. Normal urinary bladder. Stomach/Bowel: Normal remainder of the stomach. Normal colon and terminal ileum. Normal small bowel. Vascular/Lymphatic:  Aortic and branch vessel atherosclerosis. Index right external iliac node  is similar at 6 mm on 98/2. A left obturator node measures 6 mm on 95/2 and is unchanged. Reproductive: Hysterectomy. Both ovaries are positioned within the high pelvis, likely postsurgical. Other: No significant free fluid. Mild pelvic floor laxity. Upper pelvic omental thickening is similar on 84/2. No well-defined omental nodularity. Musculoskeletal: Degenerative disc disease at the lumbosacral junction. IMPRESSION: 1. Status post hysterectomy; no evidence of residual or recurrent adenopathy. No findings of metastatic disease. 2. New areas of relatively linear, non masslike ground-glass and airspace within both lungs, worse on the right. This could simply be related to hypoventilation and subsegmental atelectasis. Atypical infection cannot be excluded. Correlate with respiratory symptoms. 3. Coronary artery atherosclerosis. Aortic Atherosclerosis (ICD10-I70.0). 4. Similar nonspecific thickening within the upper pelvic omentum. 5. No change in 2 mm left upper lobe pulmonary nodule. Electronically Signed   By: Abigail Miyamoto M.D.   On: 10/05/2019 14:46   Ct Abdomen Pelvis W Contrast  Result Date: 10/05/2019 CLINICAL DATA:  Endometrial cancer diagnosed last year. Asymptomatic. EXAM: CT CHEST, ABDOMEN, AND PELVIS WITH CONTRAST TECHNIQUE: Multidetector CT imaging of the chest, abdomen and pelvis was performed following the standard protocol during bolus administration of intravenous contrast. CONTRAST:  130m OMNIPAQUE IOHEXOL 300 MG/ML  SOLN COMPARISON:  03/04/2019 FINDINGS: CT CHEST FINDINGS Cardiovascular: Aortic atherosclerosis. Mild cardiomegaly. Multivessel coronary artery atherosclerosis. No central pulmonary embolism, on this non-dedicated study. Right Port-A-Cath tip at superior caval/atrial junction. Mediastinum/Nodes: No supraclavicular adenopathy. No mediastinal or hilar adenopathy. Tiny hiatal hernia. Lungs/Pleura: No pleural fluid. 2 mm lingular nodule is similar on 82/3. Right greater than left non  masslike, relatively linear airspace and ground-glass opacity. Musculoskeletal: No acute osseous abnormality. CT ABDOMEN PELVIS FINDINGS Hepatobiliary: Normal liver. Cholecystectomy, without biliary ductal dilatation. Pancreas: Normal, without mass or ductal dilatation. Spleen: Normal in size, without focal abnormality. Adrenals/Urinary Tract: Normal adrenal glands. Mild lower pole left renal scarring. Normal right kidney. No hydronephrosis. Normal urinary bladder. Stomach/Bowel: Normal remainder of the stomach. Normal colon and terminal ileum. Normal small bowel. Vascular/Lymphatic: Aortic and branch vessel atherosclerosis. Index right external iliac node is similar at 6 mm on 98/2. A left obturator node measures 6 mm on 95/2 and is unchanged. Reproductive: Hysterectomy. Both ovaries are positioned within the high pelvis, likely postsurgical. Other: No significant free fluid. Mild pelvic floor laxity. Upper pelvic omental thickening is similar on 84/2. No well-defined omental nodularity. Musculoskeletal: Degenerative disc disease at the lumbosacral junction. IMPRESSION: 1. Status post hysterectomy; no evidence of residual or recurrent adenopathy. No findings of metastatic disease. 2. New areas of relatively linear, non masslike ground-glass and airspace within both lungs, worse on the right. This could simply be related to hypoventilation and subsegmental atelectasis. Atypical infection cannot be excluded. Correlate with respiratory symptoms. 3. Coronary artery atherosclerosis. Aortic Atherosclerosis (ICD10-I70.0). 4. Similar nonspecific thickening within the upper pelvic omentum. 5. No change in 2 mm left upper lobe pulmonary nodule. Electronically Signed   By: KAbigail MiyamotoM.D.   On: 10/05/2019 14:46     Assessment and plan- Patient is a 68y.o. female with stage IIIc grade 2 endometrial carcinoma with bilateral positive pelvic sentinel lymph node s/p TLH/BSO with pelvic sentinel lymph node biopsies in  October 2019 followed by adjuvant chemotherapy and radiation treatment.   Patient is completed chemotherapy followed by radiation.  Repeat scans done 3 days ago showed no evidence of recurrence or metastatic disease.  Suspect groundglass opacity seen in the  right lower lobe are sequelae from her recent history of COVID.  In terms of her endometrial cancer surveillance this will be followed based on clinical signs and symptoms.  Her baseline Ca1 25 was not elevated.  She has an appointment with GYN oncology in 2 weeks.  I will see her back in 3 months and following that I will see her every 6 months.  One episode of blood in stool: She has had prior endoscopy done by Dr. Vicente Males.  I have recommended if she has a repeat episode of blood in stool or dark tarry stools she should get in touch with the GI office.  Patient does have baseline normocytic anemia and her hemoglobin has remained stable between 9-10 which I suspect is secondary to anemia of chronic disease.  Hypokalemia: She does have oral potassium at home which she has not taken for the last 1 month.  I have recommended that she restart taking 20 mEq of potassium daily.   Visit Diagnosis 1. Encounter for follow-up surveillance of endometrial cancer   2. Anemia of chronic disease   3. Hypokalemia      Dr. Randa Evens, MD, MPH [facility] [phone] 10/07/2019 8:33 AM

## 2019-10-07 NOTE — Progress Notes (Signed)
Patient stated that on Tuesday she had a spell of dizziness that lasted 30 seconds and then she was okay. Patient also stated that this AM after she had a bowel movement, she saw blood in her toilet. Patient stated that this was the first time that she had noticed this problem.

## 2019-10-17 ENCOUNTER — Other Ambulatory Visit: Payer: Self-pay

## 2019-10-17 ENCOUNTER — Ambulatory Visit (INDEPENDENT_AMBULATORY_CARE_PROVIDER_SITE_OTHER): Payer: Medicare Other | Admitting: Surgery

## 2019-10-17 ENCOUNTER — Encounter: Payer: Self-pay | Admitting: Surgery

## 2019-10-17 VITALS — BP 130/80 | HR 65 | Temp 97.2°F | Ht 59.0 in | Wt 225.2 lb

## 2019-10-17 DIAGNOSIS — C541 Malignant neoplasm of endometrium: Secondary | ICD-10-CM | POA: Diagnosis not present

## 2019-10-17 NOTE — Progress Notes (Signed)
Procedure note 1. Removal of Right port-a-cath  Anesthesia: lidocaine 1% epi 15cc  EBL: minimal  The patient was playing about the procedure in detail. Possible complications and a consent was obtained.  She was prepped and draped in the usual sterile fashion local anesthetic was infiltrated.  15 blade knife used to create an incision and made some bowel scissors used to dissect the pocket and cut the Prolene sutures from the chest wall.  After asking the patient to do a Valsalva I remove the Port-A-Cath without any complications.  Pressure was applied.  The wound was closed in a 2 layer fashion with interrupted 3-0 Vicryl's and 4-0 Monocryl for the skin in a subcuticular fashion.  Dermabond was applied

## 2019-10-17 NOTE — Patient Instructions (Signed)
May shower in 24 hours May use ice pack as needed for comfort The glue will fall off in 1-2 weeks.  May use Ibuprofen or Tylenol as needed.  Follow-up with our office as needed.  Please call and ask to speak with a nurse if you develop questions or concerns.

## 2019-10-18 ENCOUNTER — Other Ambulatory Visit: Payer: Self-pay

## 2019-10-18 NOTE — Progress Notes (Signed)
Patient pre screened for office appointment, no questions or concerns today. 

## 2019-10-19 ENCOUNTER — Other Ambulatory Visit: Payer: Self-pay

## 2019-10-19 ENCOUNTER — Inpatient Hospital Stay (HOSPITAL_BASED_OUTPATIENT_CLINIC_OR_DEPARTMENT_OTHER): Payer: Medicare Other | Admitting: Obstetrics and Gynecology

## 2019-10-19 VITALS — BP 142/66 | HR 52 | Temp 95.8°F | Resp 16 | Wt 224.9 lb

## 2019-10-19 DIAGNOSIS — Z923 Personal history of irradiation: Secondary | ICD-10-CM | POA: Diagnosis not present

## 2019-10-19 DIAGNOSIS — I10 Essential (primary) hypertension: Secondary | ICD-10-CM | POA: Diagnosis not present

## 2019-10-19 DIAGNOSIS — C541 Malignant neoplasm of endometrium: Secondary | ICD-10-CM

## 2019-10-19 DIAGNOSIS — R918 Other nonspecific abnormal finding of lung field: Secondary | ICD-10-CM | POA: Diagnosis not present

## 2019-10-19 DIAGNOSIS — Z90722 Acquired absence of ovaries, bilateral: Secondary | ICD-10-CM

## 2019-10-19 DIAGNOSIS — Z90710 Acquired absence of both cervix and uterus: Secondary | ICD-10-CM

## 2019-10-19 DIAGNOSIS — Z79899 Other long term (current) drug therapy: Secondary | ICD-10-CM | POA: Diagnosis not present

## 2019-10-19 DIAGNOSIS — Z9221 Personal history of antineoplastic chemotherapy: Secondary | ICD-10-CM

## 2019-10-19 DIAGNOSIS — E876 Hypokalemia: Secondary | ICD-10-CM | POA: Diagnosis not present

## 2019-10-19 DIAGNOSIS — Z8619 Personal history of other infectious and parasitic diseases: Secondary | ICD-10-CM | POA: Diagnosis not present

## 2019-10-19 DIAGNOSIS — D649 Anemia, unspecified: Secondary | ICD-10-CM | POA: Diagnosis not present

## 2019-10-19 DIAGNOSIS — Z8673 Personal history of transient ischemic attack (TIA), and cerebral infarction without residual deficits: Secondary | ICD-10-CM | POA: Diagnosis not present

## 2019-10-19 DIAGNOSIS — J45909 Unspecified asthma, uncomplicated: Secondary | ICD-10-CM | POA: Diagnosis not present

## 2019-10-19 NOTE — Progress Notes (Signed)
Gynecologic Oncology Interval Visit   Referring Provider: Dr. Marcelline Mates  Chief Complaint: FIGO Stage IIIc1 grade 2 endometrioid endometrial cancer.  Subjective:  Molly [name] is a 68 y.o. female, initially seen in consultation for Dr. Marcelline Mates, grade 2 endometrioid endometrial cancer, s/p TLH-BSO with SLN mapping and biopsies on 10/06/18 with Dr. Fransisca Connors and Dr. Marcelline Mates at [facility], followed by chemotherapy and radiation with vaginal cuff boost, who returns to clinic today for follow-up.  . She recovered from covid a month ago. Mainly had a cough.  IV portacath removed 2 days ago.   Today, she reports feeling well. She has neuropathy of feet and toes which she is seeking acupuncture for. Dr Janese Banks has prescribed Lyrica and this helps. She is fully ambulatory.    Gynecologic Oncology History:  She has history of Grade 3 uterine prolapse with Grade 1 cystocele and rectocele. She noted pessary had become dislodged after coughing d/t pneumonia and presented to Dr. Marcelline Mates for evaluation 05/2018. At that time she noted post-menopausal bleeding and cramping and has history of cervical polyps. Bleeding was felt to be possibly related to pessary.   Ultrasound revealed: uterus measuring 10.2 x 5.5 x 5.7 cm, heterogenous with evidence of fibroids invading endometrium measuring 2.9 x 3.3 x 3.6 cm and two additional fibroids measuring 2.0 x 2.1 x 1.8, and 2.9 x 3.3 x 3.6. Endometrium measuring 4.7 mm. Neither ovary was visualized.   She had second episode of bleeding 08/31/18-09/04/18, felt to be heavier and more 'period like'. Endometrial biopsy was performed. Pathology: - Endometrioid adenocarcinoma, figo grade 1.   Pathology:  DIAGNOSIS:  A. UTERUS WITH CERVIX AND BILATERAL FALLOPIAN TUBES AND OVARIES; TOTAL HYSTERECTOMY WITH BILATERAL SALPINGO-OOPHORECTOMY:  - ENDOMETRIAL ADENOCARCINOMA.  - SEE CANCER SUMMARY BELOW.  - CERVIX WITH NABOTHIAN CYSTS, CHRONIC CERVICITIS WITH SQUAMOUS METAPLASIA, AND 0.9 CM ENDOCERVICAL  POLYP.  - INACTIVE BACKGROUND ENDOMETRIUM WITH ENDOMETRIAL POLYP.  - MYOMETRIUM WITH ADENOMYOSIS AND LEIOMYOMATA, LARGEST MEASURING 4.5 CM.  - UNREMARKABLE FALLOPIAN TUBES AND OVARIES.   B. SENTINEL LYMPH NODE, LEFT OBTURATOR; EXCISION:  - METASTATIC CARCINOMA INVOLVES ONE LYMPH NODE (1/1).   C. SENTINEL LYMPH NODE, LEFT EXTERNAL ILIAC; EXCISION:  - LYMPHOID TISSUE NOT PRESENT.  - NEGATIVE FOR MALIGNANCY.   D. SENTINEL LYMPH NODE, RIGHT EXTERNAL ILIAC; EXCISION:  - METASTATIC CARCINOMA INVOLVES ONE OF TWO LYMPH NODES (1/2).   LVSI present and atypical washings.  Invasion 6/18 and cervix and adnexa negative.   MLH1: LOSS of protein expression  MSH2: Intact nuclear expression  MSH6: Intact nuclear expression  PMS2: LOSS of protein expression   MLH1- positive methylation HER-2 negative  Pathologic Stage: FIGO stage IIIC1 grade 2  PET CT scan showed metastatic adenopathy and retroperitoneal and external iliac group of lymph nodes in the right side. No evidence of metastatic disease elsewhere.  She received 6 cycles of carbo-taxol on 11/05/2018-02/25/2019. She completed radiation to pelvic and and periaortic nodes 03-31-2019-05/06/2019 with boost to vaginal cuff after 3rd cycle of chemotherapy. She completed on 06/15/2019.    Problem List: Patient Active Problem List   Diagnosis Date Noted  . 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 Immunosuppressed status ([place]) 01/11/2019  . Iron deficiency anemia 11/15/2018  . Endometrial adenocarcinoma ([place]) 10/29/2018  . Umbilical hernia without obstruction and without gangrene   . Atherosclerosis of right coronary artery 11/10/2017  . Atherosclerosis of abdominal aorta ([place]) 11/10/2017  . Prediabetes 10/02/2017  . Pain in limb 03/31/2017  .  Perennial allergic rhinitis with seasonal variation 05/14/2016  . Asthma, mild intermittent, well-controlled 05/14/2016  . Hypertension goal BP  (blood pressure) < 140/90 12/10/2015  . Cardiomyopathy due to hypertension ([place]) 12/10/2015  . History of CVA (cerebrovascular accident) 12/10/2015  . Hyperlipidemia LDL goal <70 12/10/2015  . Statin intolerance 12/10/2015    Past Medical History: Past Medical History:  Diagnosis Date  . Anxiety   . Asthma    history of asthma  . Cardiomyopathy ([place])   . Complication of anesthesia    tore hair out and made teeth rough  . COVID-19 virus infection   . Depression   . Endometrial cancer ([place]) 08/2018  . GERD (gastroesophageal reflux disease)    takes prilosec prn  . History of CVA (cerebrovascular accident) 12/10/2015  . Hyperlipidemia LDL goal <70 12/10/2015  . Hypertension   . Prediabetes 10/02/2017   A1c 6 in January 2018  . Stroke [facility]) 1990    no residual effects    Past Surgical History: Past Surgical History:  Procedure Laterality Date  . ABDOMINAL HYSTERECTOMY    . CHOLECYSTECTOMY    . COLONOSCOPY WITH PROPOFOL N/A 01/08/2017   Procedure: COLONOSCOPY WITH PROPOFOL;  Surgeon: [name], MD;  Location: [facility] ENDOSCOPY;  Service: Endoscopy;  Laterality: N/A;  . COLONOSCOPY WITH PROPOFOL N/A 05/05/2018   Procedure: COLONOSCOPY WITH PROPOFOL;  Surgeon: [name], MD;  Location[facility] ENDOSCOPY;  Service: Gastroenterology;  Laterality: N/A;  . HERNIA REPAIR  14/6431   umbilical  . PORTACATH PLACEMENT N/A 11/04/2018   Procedure: INSERTION PORT-A-CATH;  Surgeon: Jules Husbands, MD;  Location: [facility] ORS;  Service: General;  Laterality: N/A;  . SENTINEL NODE BIOPSY N/A 10/06/2018   Procedure: SENTINEL NODE BIOPSY;  Surgeon: Mellody Drown, MD;  Location: [facility] ORS;  Service: Gynecology;  Laterality: N/A;  . UMBILICAL HERNIA REPAIR N/A 11/17/2017   Procedure: HERNIA REPAIR UMBILICAL ADULT;  Surgeon: Jules Husbands, MD;  Location: [facility] ORS;  Service: General;  Laterality: N/A;   Past Gynecologic History:  [id number] Denies abnormal pap smears.  History of OCP use.  Denies STD  history  OB History:  OB History  Gravida Para Term Preterm AB Living  2 2 2     2   SAB TAB Ectopic Multiple Live Births          2    # Outcome Date GA Lbr Len/2nd Weight Sex Delivery Anes PTL Lv  2 Term 55    F Vag-Spont   LIV  1 Term 71    F Vag-Spont   LIV    Family History: Family History  Problem Relation Age of Onset  . Stroke Mother   . Hypertension Mother   . Dementia Mother   . Alzheimer's disease Mother   . Gout Father   . Asthma Father   . Hypertension Father   . Dementia Father   . Healthy Sister   . Stroke Brother   . Alzheimer's disease Brother   . Healthy Daughter   . Hypertension Brother   . Healthy Brother   . Cancer Paternal Grandmother   . Healthy Sister   . Healthy Sister   . Hypertension Sister   . Hypertension Sister   . Stroke Brother   . Alzheimer's disease Brother   . Stroke Brother   . Hypertension Brother   . Stroke Brother   . Hypertension Brother   . Healthy Brother   . Healthy Brother   . Hypertension Daughter   . Breast  cancer Neg Hx    Social History: Social History   Socioeconomic History  . Marital status: Divorced    Spouse name: Not on file  . Number of children: 2  . Years of education: some college  . Highest education level: 12th grade  Occupational History  . 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 Occupation: currently a Careers adviser  . Financial resource strain: Not hard at all  . Food insecurity    Worry: Never true    Inability: Never true  . Transportation needs    Medical: No    Non-medical: No  Tobacco Use  . Smoking status: Never Smoker  . Smokeless tobacco: Never Used  . Tobacco comment: smoking cessation materials not required  Substance and Sexual Activity  . Alcohol use: No    Alcohol/week: 0.0 standard drinks  . Drug use: No  . Sexual activity: Not Currently  Lifestyle  . Physical activity    Days per week: 0 days    Minutes per session: 0 min  .  Stress: Not at all  Relationships  . Social connections    Talks on phone: More than three times a week    Gets together: Three times a week    Attends religious service: More than 4 times per year    Active member of club or organization: Yes    Attends meetings of clubs or organizations: More than 4 times per year    Relationship status: Divorced  . Intimate partner violence    Fear of current or ex partner: No    Emotionally abused: No    Physically abused: No    Forced sexual activity: No  Other Topics Concern  . Not on file  Social History Narrative  . Not on file    Allergies: Allergies  Allergen Reactions  . Ferumoxytol Anaphylaxis  . Nsaids Hives and Rash  . Statins Other (See Comments)    Joint pains  . Victoza [Liraglutide] Other (See Comments)    pancreatitis  . Oxycodone Nausea And Vomiting  . Crestor [Rosuvastatin Calcium] Rash    Current Medications: Current Outpatient Medications  Medication Sig Dispense Refill  . acetaminophen (TYLENOL) 500 MG tablet Take 2 tablets (1,000 mg total) by mouth every 6 (six) hours as needed for mild pain or moderate pain. 30 tablet 0  . albuterol (PROVENTIL HFA;VENTOLIN HFA) 108 (90 Base) MCG/ACT inhaler Inhale 2 puffs into the lungs every 4 (four) hours as needed for wheezing or shortness of breath. 1 Inhaler 0  . benzonatate (TESSALON) 100 MG capsule Take 1-2 capsules (100-200 mg total) by mouth 2 (two) times daily as needed. 40 capsule 0  . escitalopram (LEXAPRO) 10 MG tablet Take 1 tablet (10 mg total) by mouth daily. 30 tablet 2  . ezetimibe (ZETIA) 10 MG tablet Take 1 tablet (10 mg total) by mouth daily. 30 tablet 2  . furosemide (LASIX) 40 MG tablet Take 1 tablet (40 mg total) by mouth daily as needed for fluid. 30 tablet 2  . ketoconazole (NIZORAL) 2 % cream Apply 1 application topically daily as needed for irritation. On abdominal fold prn 60 g 0  . lidocaine-prilocaine (EMLA) cream Apply to affected area once 30 g 3   . loratadine (CLARITIN) 10 MG tablet Take 1 tablet by mouth daily. 30 tablet 4  . magnesium oxide (MAG-OX) 400 (241.3 Mg) MG tablet Take 1 tablet (400 mg total) by mouth daily.  For hypomagnesemia 30 tablet 0  . olmesartan-hydrochlorothiazide (BENICAR HCT) 20-12.5 MG tablet Take 1 tablet by mouth daily. 90 tablet 1  . omeprazole (PRILOSEC OTC) 20 MG tablet Take 20 mg by mouth daily as needed.    . Potassium 99 MG TABS Take 1 tablet by mouth daily.    . pregabalin (LYRICA) 75 MG capsule Take 1 capsule by mouth twice daily. 60 capsule 3   No current facility-administered medications for this visit.    Facility-Administered Medications Ordered in Other Visits  Medication Dose Route Frequency Provider Last Rate Last Dose  . sodium chloride flush (NS) 0.9 % injection 10 mL  10 mL Intravenous PRN Sindy Guadeloupe, MD   10 mL at 11/15/18 0915  . sodium chloride flush (NS) 0.9 % injection 10 mL  10 mL Intracatheter Once PRN Sindy Guadeloupe, MD       Review of Systems General:  no complaints Skin: no complaints Eyes: no complaints HEENT: no complaints Breasts: no complaints Pulmonary: no complaints Cardiac: no complaints Gastrointestinal: no complaints Genitourinary/Sexual: no complaints Ob/Gyn: no complaints Musculoskeletal: no complaints Hematology: no complaints Neurologic/Psych: no complaints, persistent neuropathy in feet/thighs and hands.   Objective:  Physical Examination:  BP (!) 142/66   Pulse (!) 52   Resp 16   Wt 224 lb 14.4 oz (102 kg)   BMI 45.42 kg/m     ECOG Performance Status: 0 - Asymptomatic  GENERAL: Patient is a well appearing female in no acute distress HEENT:  Sclera clear. Anicteric NODES:  Negative axillary, supraclavicular, inguinal lymph node survery Chest: portacath removal incision healing well.  LUNGS:  Clear to auscultation bilaterally.   HEART:  Regular rate and rhythm.  ABDOMEN:  Soft, nontender.  No hernias, incisions well healed. No masses or  ascites EXTREMITIES:  No peripheral edema. Atraumatic. No cyanosis SKIN:  Clear with no obvious rashes or skin changes.  NEURO:  Nonfocal. Well oriented.  Appropriate affect.  Pelvic: EGBUS: no lesions Vagina: cuff well healed.  Bimanual/RV: no masses  Assessment:  Molly [name] is a 68 y.o. female diagnosed with stage IIIC1 grade 2 endometrial cancer with bilateral positive pelvic SLNs with macroscopic tumor s/p TLH/BSO pelvic SLN biopsies 10/06/18.  Received sandwich therapy with 3 cycles of carboplatin/taxol, external pelvic radiation with vaginal brachytherapy and then 3 more cycles of chemotherapy completed 6/20.  Normal Exam today.   Persistent peripheral neuropathy due to taxol.    COVID19 infection 9/20.  Tumor has MLH1 loss with promoter methylation. HER-2 negative.   Medical co-morbidities complicating care: Morbid obesity, CVA 1990 with no residual and no medication, HTN with good BP today.    Plan:   Problem List Items Addressed This Visit      Genitourinary   Endometrial adenocarcinoma ([place]) - Primary     Continue follow up with Dr. Janese Banks and Dr. Baruch Gouty.  Dr Janese Banks will continue to manage her neuropathy.  We will see her back in 6 months. We will see her back sooner if any symptoms arise.   Tumor has MLH1 loss with promoter methylation, so she would be a candidate for immune checkpoint inhibitor therapy if she has a recurrence in the future. HER-2 negative.   Mellody Drown, MD  CC:  Steele Sizer, [place] [address] [place],  [place] [address] [phone]

## 2019-10-26 ENCOUNTER — Ambulatory Visit: Payer: Medicare Other

## 2019-11-02 ENCOUNTER — Ambulatory Visit: Payer: Self-pay | Admitting: Pharmacist

## 2019-11-02 DIAGNOSIS — I10 Essential (primary) hypertension: Secondary | ICD-10-CM

## 2019-11-02 NOTE — Chronic Care Management (AMB) (Signed)
  Chronic Care Management   Note  [id number] Name: Molly [name] MRN: [id number] DOB: Aug 03, 1951  68 y.o. year old female referred to Chronic Care Management by her health plan.   Was unable to reach patient via telephone today and have left HIPAA compliant voicemail asking patient to return my call. (unsuccessful outreach #1).  Follow up plan: A HIPPA compliant phone message was left for the patient providing contact information and requesting a return call.  The care management team will reach out to the patient again over the next 5-7 days.   Ruben Reason, PharmD Clinical Pharmacist [facility] [phone]

## 2019-11-11 ENCOUNTER — Ambulatory Visit: Payer: Medicare Other | Admitting: Radiation Oncology

## 2019-11-11 ENCOUNTER — Ambulatory Visit: Payer: Self-pay | Admitting: Pharmacist

## 2019-11-18 NOTE — Chronic Care Management (AMB) (Signed)
  Chronic Care Management   Note  [id number] Name: Molly [name] MRN: [id number] DOB: [birth year]  68 y.o. year old female referred to Chronic Care Management by her health plan.   Was unable to reach patient via telephone today and have left HIPAA compliant voicemail asking patient to return my call. (unsuccessful outreach #2).  Follow up plan: A HIPPA compliant phone message was left for the patient providing contact information and requesting a return call.  The care management team will reach out to the patient again over the next 5-7 days.   Ruben Reason, PharmD Clinical Pharmacist [facility] [phone]

## 2019-11-22 ENCOUNTER — Ambulatory Visit: Payer: Self-pay | Admitting: Pharmacist

## 2019-11-22 ENCOUNTER — Telehealth: Payer: Self-pay

## 2019-11-22 NOTE — Chronic Care Management (AMB) (Signed)
  Chronic Care Management   Note  [id number] Name: Molly [name] MRN: [id number] DOB: 05-20-1951  68 y.o. year old female referred to Chronic Care Management clinical pharmacist for medication adherence by her health plan.   CCM team services are being closed due to three unsuccessful outreach attempts.   Patient has been provided CCM contact information if he/she wishes to engage with care managers in the future.   Ruben Reason, PharmD Clinical Pharmacist [facility] [phone]

## 2019-12-04 ENCOUNTER — Other Ambulatory Visit: Payer: Self-pay | Admitting: Family Medicine

## 2019-12-04 DIAGNOSIS — T451X5A Adverse effect of antineoplastic and immunosuppressive drugs, initial encounter: Secondary | ICD-10-CM

## 2019-12-04 DIAGNOSIS — G620 Drug-induced polyneuropathy: Secondary | ICD-10-CM

## 2019-12-04 DIAGNOSIS — I10 Essential (primary) hypertension: Secondary | ICD-10-CM

## 2019-12-04 DIAGNOSIS — I119 Hypertensive heart disease without heart failure: Secondary | ICD-10-CM

## 2019-12-04 DIAGNOSIS — I43 Cardiomyopathy in diseases classified elsewhere: Secondary | ICD-10-CM

## 2019-12-04 DIAGNOSIS — R600 Localized edema: Secondary | ICD-10-CM

## 2019-12-04 NOTE — Telephone Encounter (Signed)
Requested medication (s) are due for refill today: {yes  Requested medication (s) are on the active medication list: yes  Last refill:  Lyrica 08/01/2019,  Lasix 08/15/2019  Future visit scheduled: No  Notes to clinic:  Lyrica not delegated, Lasix last potassium low, potassium on med list is historical!    Requested Prescriptions  Pending Prescriptions Disp Refills   pregabalin (LYRICA) 75 MG capsule [Pharmacy Med Name: Pregabalin 75mg  Capsule] 60 capsule     Sig: Take 1 capsule by mouth twice daily.     Not Delegated - Neurology:  Anticonvulsants - Controlled Failed - 12/04/2019  5:07 AM      Failed - This refill cannot be delegated      Passed - Valid encounter within last 12 months    Recent Outpatient Visits          2 months ago Cough   [facility] [place], Drue Stager, MD   3 months ago Mild recurrent major depression [facility])   [facility] Steele Sizer, MD   4 months ago Exposure to [facility] Steele Sizer, MD   6 months ago Mild recurrent major depression [facility])   [facility] Steele Sizer, MD   10 months ago Atherosclerosis of right coronary artery   [facility], Drue Stager, MD              furosemide (LASIX) 40 MG tablet [Pharmacy Med Name: Furosemide 40mg  Tablet] 30 tablet 2    Sig: Take 1 tablet by mouth daily as needed for fluid.     Cardiovascular:  Diuretics - Loop Failed - 12/04/2019  5:07 AM      Failed - K in normal range and within 360 days    Potassium  Date Value Ref Range Status  10/05/2019 3.2 (L) 3.5 - 5.1 mmol/L Final         Failed - Last BP in normal range    BP Readings from Last 1 Encounters:  10/19/19 (!) 142/66         Passed - Ca in normal range and within 360 days    Calcium  Date Value Ref Range Status  10/05/2019 9.1 8.9 - 10.3 mg/dL Final         Passed - Na in normal range and within 360 days    Sodium   Date Value Ref Range Status  10/05/2019 141 135 - 145 mmol/L Final         Passed - Cr in normal range and within 360 days    Creat  Date Value Ref Range Status  09/07/2018 0.96 0.50 - 0.99 mg/dL Final    Comment:    For patients >45 years of age, the reference limit for Creatinine is approximately 13% higher for people identified as African-American. .    Creatinine, Ser  Date Value Ref Range Status  10/05/2019 0.71 0.44 - 1.00 mg/dL Final         Passed - Valid encounter within last 6 months    Recent Outpatient Visits          2 months ago Cough   [facility], Drue Stager, MD   3 months ago Mild recurrent major depression [facility])   [facility] Steele Sizer, MD   4 months ago Exposure to [facility] Steele Sizer, MD   6 months ago Mild recurrent major depression ([place])  [facility], Drue Stager, MD   10 months ago Atherosclerosis of right coronary artery   [facility] Steele Sizer, MD             Signed Prescriptions Disp Refills   loratadine (CLARITIN) 10 MG tablet 30 tablet 4    Sig: Take 1 tablet by mouth daily.     Ear, Nose, and Throat:  Antihistamines Passed - 12/04/2019  5:07 AM      Passed - Valid encounter within last 12 months    Recent Outpatient Visits          2 months ago Cough   [facility], Drue Stager, MD   3 months ago Mild recurrent major depression [facility])   [facility] Steele Sizer, MD   4 months ago Exposure to [facility] Steele Sizer, MD   6 months ago Mild recurrent major depression [facility])   [facility] Steele Sizer, MD   10 months ago Atherosclerosis of right coronary artery   [facility] Steele Sizer, MD

## 2019-12-29 ENCOUNTER — Telehealth: Payer: Self-pay | Admitting: Family Medicine

## 2019-12-29 DIAGNOSIS — G620 Drug-induced polyneuropathy: Secondary | ICD-10-CM

## 2019-12-29 DIAGNOSIS — T451X5A Adverse effect of antineoplastic and immunosuppressive drugs, initial encounter: Secondary | ICD-10-CM

## 2019-12-29 DIAGNOSIS — F330 Major depressive disorder, recurrent, mild: Secondary | ICD-10-CM

## 2019-12-29 DIAGNOSIS — I700 Atherosclerosis of aorta: Secondary | ICD-10-CM

## 2019-12-29 NOTE — Telephone Encounter (Signed)
Requested medication (s) are due for refill today: no  Requested medication (s) are on the active medication list:  yes  Last refill:  12/05/2019  Future visit scheduled: no  Notes to clinic:  This refill cannot be delegated    Requested Prescriptions  Pending Prescriptions Disp Refills   pregabalin (LYRICA) 75 MG capsule [Pharmacy Med Name: Pregabalin 75mg  Capsule] 60 capsule     Sig: Take 1 capsule by mouth twice daily.      Not Delegated - Neurology:  Anticonvulsants - Controlled Failed - 12/29/2019  5:49 AM      Failed - This refill cannot be delegated      Passed - Valid encounter within last 12 months    Recent Outpatient Visits           3 months ago Cough   [facility] [place], Drue Stager, MD   4 months ago Mild recurrent major depression [facility])   [facility] Steele Sizer, MD   5 months ago Exposure to [facility] Steele Sizer, MD   7 months ago Mild recurrent major depression [facility])   [facility] Steele Sizer, MD   11 months ago Atherosclerosis of right coronary artery   [facility] Steele Sizer, MD               Signed Prescriptions Disp Refills   escitalopram (LEXAPRO) 10 MG tablet 30 tablet 2    Sig: Take 1 tablet by mouth daily.      Psychiatry:  Antidepressants - SSRI Passed - 12/29/2019  5:49 AM      Passed - Valid encounter within last 6 months    Recent Outpatient Visits           3 months ago Cough   [facility], Drue Stager, MD   4 months ago Mild recurrent major depression [facility])   [facility] Steele Sizer, MD   5 months ago Exposure to [facility] Steele Sizer, MD   7 months ago Mild recurrent major depression [facility])   [facility] Steele Sizer, MD   11 months ago Atherosclerosis of right coronary artery   [facility] [place], Drue Stager, MD                ezetimibe (ZETIA) 10 MG tablet 30 tablet 2    Sig: Take 1 tablet by mouth daily.      Cardiovascular:  Antilipid - Sterol Transport Inhibitors Failed - 12/29/2019  5:49 AM      Failed - Total Cholesterol in normal range and within 360 days    Cholesterol  Date Value Ref Range Status  09/08/2019 211 (H) 0 - 200 mg/dL Final          Failed - LDL in normal range and within 360 days    LDL Cholesterol (Calc)  Date Value Ref Range Status  09/07/2018 123 (H) mg/dL (calc) Final    Comment:    Reference range: <100 . Desirable range <100 mg/dL for primary prevention;   <70 mg/dL for patients with CHD or diabetic patients  with > or = 2 CHD risk factors. Marland Kitchen LDL-C is now calculated using the Martin-Hopkins  calculation, which is a validated novel method providing  better accuracy than the Friedewald equation in the  estimation of LDL-C.  Cresenciano Genre et al. Annamaria Helling. [id number]): 2061-2068  (http://education.[employer].com/faq/FAQ164)  LDL Cholesterol  Date Value Ref Range Status  09/08/2019 130 (H) 0 - 99 mg/dL Final    Comment:           Total Cholesterol/HDL:CHD Risk Coronary Heart Disease Risk Table                     Men   Women  1/2 Average Risk   3.4   3.3  Average Risk       5.0   4.4  2 X Average Risk   9.6   7.1  3 X Average Risk  23.4   11.0        Use the calculated Patient Ratio above and the CHD Risk Table to determine the patient's CHD Risk.        ATP III CLASSIFICATION (LDL):  <100     mg/dL   Optimal  100-129  mg/dL   Near or Above                    Optimal  130-159  mg/dL   Borderline  160-189  mg/dL   High  >190     mg/dL   Very High Performed at [facility], [address] [place], [place] [address]           Passed - HDL in normal range and within 360 days    HDL  Date Value Ref Range Status  09/08/2019 61 >40 mg/dL Final          Passed - Triglycerides  in normal range and within 360 days    Triglycerides  Date Value Ref Range Status  09/08/2019 102 <150 mg/dL Final          Passed - Valid encounter within last 12 months    Recent Outpatient Visits           3 months ago Cough   [facility] Steele Sizer, MD   4 months ago Mild recurrent major depression [facility])   [facility] Steele Sizer, MD   5 months ago Exposure to [facility] Steele Sizer, MD   7 months ago [facility]   [facility] Steele Sizer, MD   11 months ago Atherosclerosis of right coronary artery   [facility] Steele Sizer, MD

## 2020-01-02 NOTE — Telephone Encounter (Signed)
Mailbox is full pt needs to schedule an appt

## 2020-01-05 NOTE — Progress Notes (Deleted)
Called patient no answer unable to leave message 

## 2020-01-06 ENCOUNTER — Inpatient Hospital Stay: Payer: Medicare Other | Admitting: Oncology

## 2020-01-06 ENCOUNTER — Inpatient Hospital Stay: Payer: Medicare Other

## 2020-01-06 ENCOUNTER — Ambulatory Visit: Admission: RE | Admit: 2020-01-06 | Payer: Medicare Other | Source: Ambulatory Visit | Admitting: Radiation Oncology

## 2020-01-10 IMAGING — CT NM PET TUM IMG INITIAL (PI) SKULL BASE T - THIGH
9 series · 25 of 25 positions shown · non-contrast
Comparison: CT 10/22/2018

CLINICAL DATA: Subsequent treatment strategy for endometrial
carcinoma.

EXAM:
NUCLEAR MEDICINE PET SKULL BASE TO THIGH
TECHNIQUE: 10.7 mCi F-18 FDG was injected intravenously. Full-ring PET imaging
was performed from the skull base to thigh after the radiotracer. CT
data was obtained and used for attenuation correction and anatomic
localization.
Fasting blood glucose: 94 mg/dl

[Series 3: pet wb (ac) · axial · 5.0mm · 4.07mm/px · z∈[-980,-114]mm · 3 of 290 slices shown]
[im 1/290]
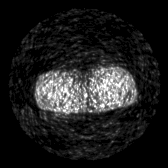
[im 145/290]
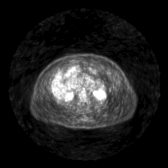
[im 290/290]
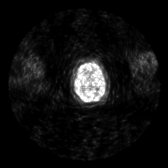

[Series 4: ct wb 5.0 b30f · axial · 5.0mm · 0.98mm/px · z∈[-980,-114]mm · 4 of 290 slices shown]
[im 1/290]
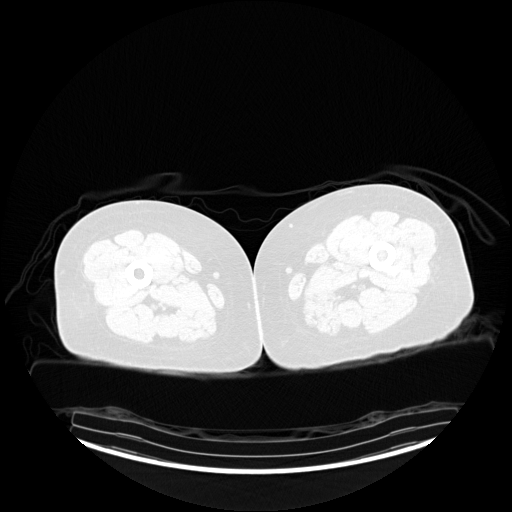
[im 97/290]
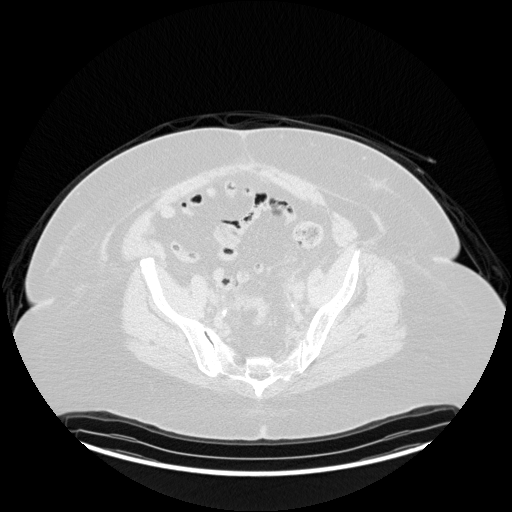
[im 193/290]
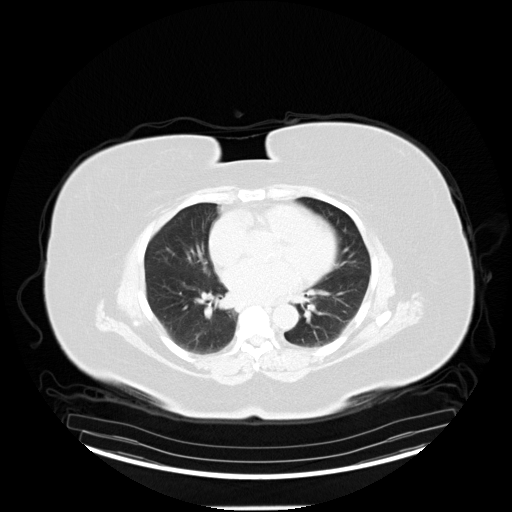
[im 290/290  brain]
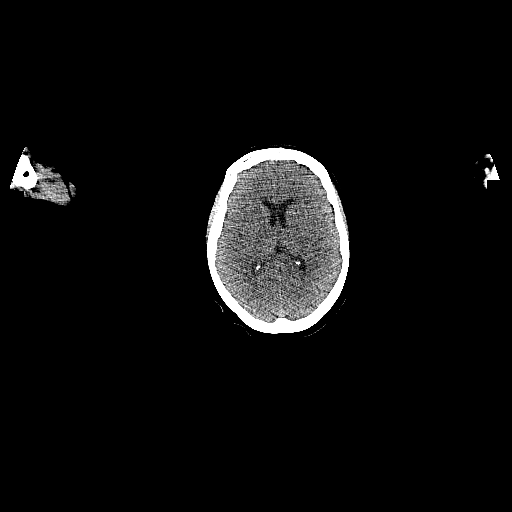

[Series 5: pet wb uncorrected (nac) · axial · 5.0mm · 4.07mm/px · z∈[-980,-114]mm · 4 of 290 slices shown]
[im 1/290]
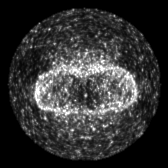
[im 97/290]
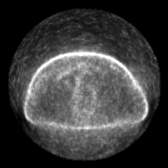
[im 193/290]
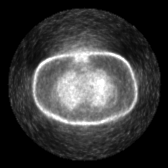
[im 290/290]
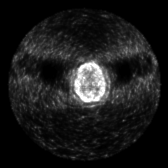

[Series 603: fused axial · 4 of 290 slices shown]
[im 1/290]
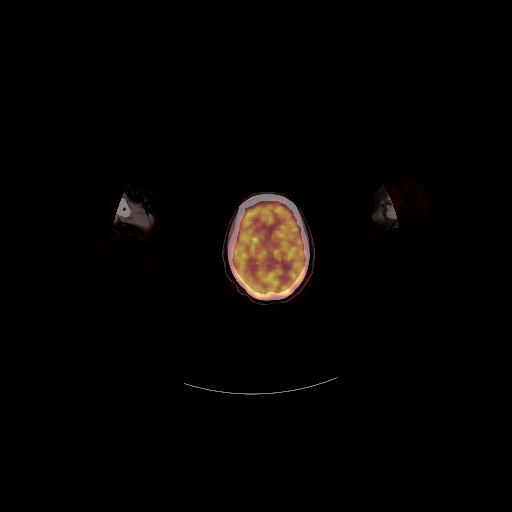
[im 97/290]
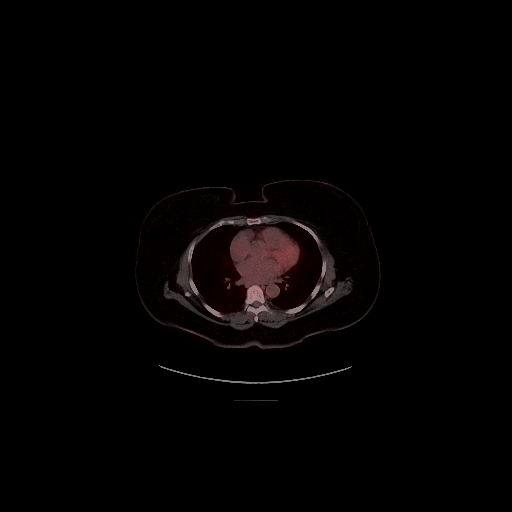
[im 193/290]
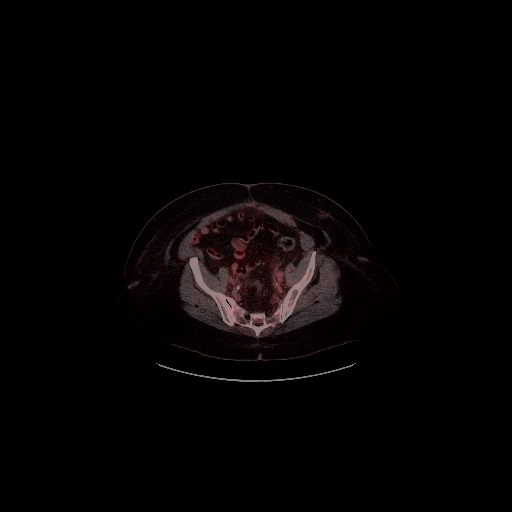
[im 290/290]
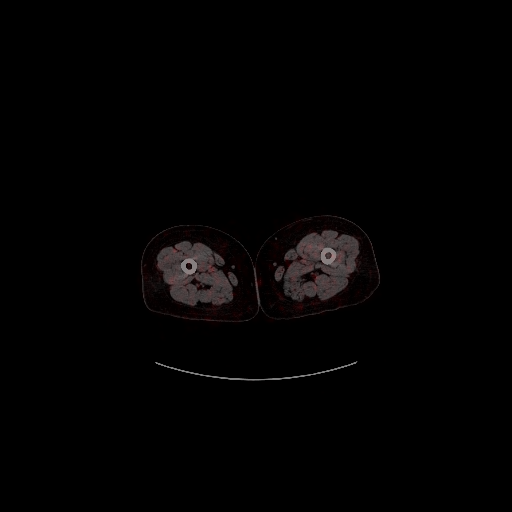

[Series 604: fused coronal · 1 of 103 slices shown]
[im 1/103]
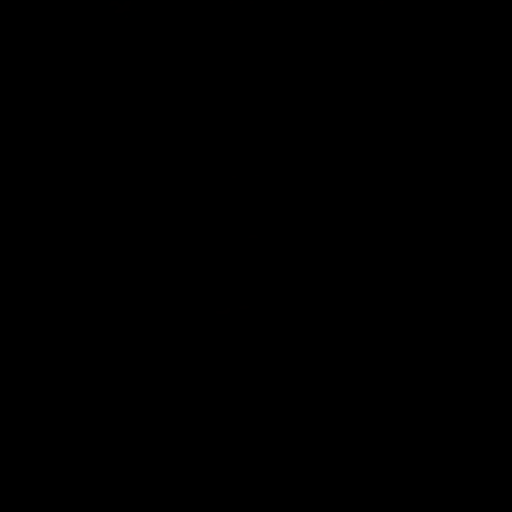

[Series 605: fused sagittal · 2 of 166 slices shown]
[im 1/166]
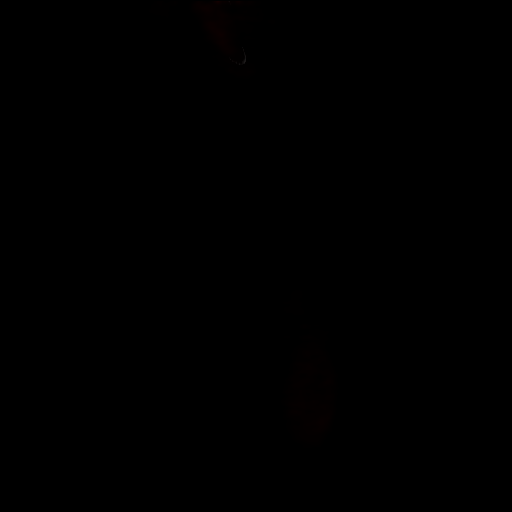
[im 166/166]
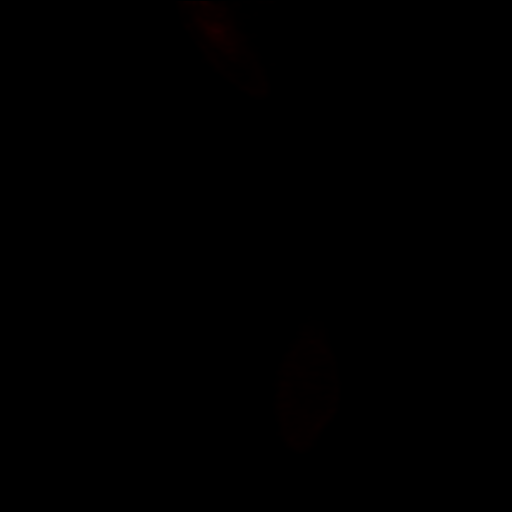

[Series 606: pet axial · 4 of 289 slices shown]
[im 1/289]
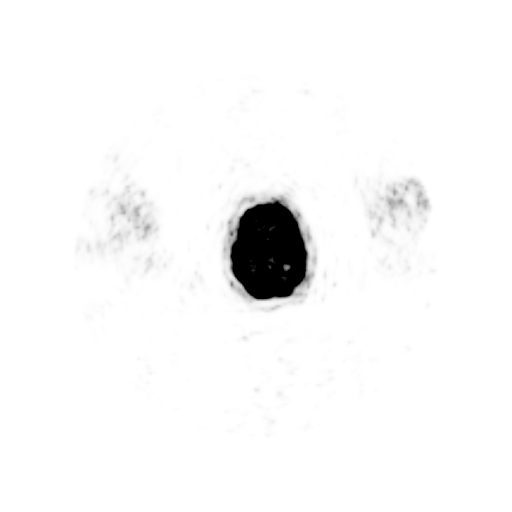
[im 97/289]
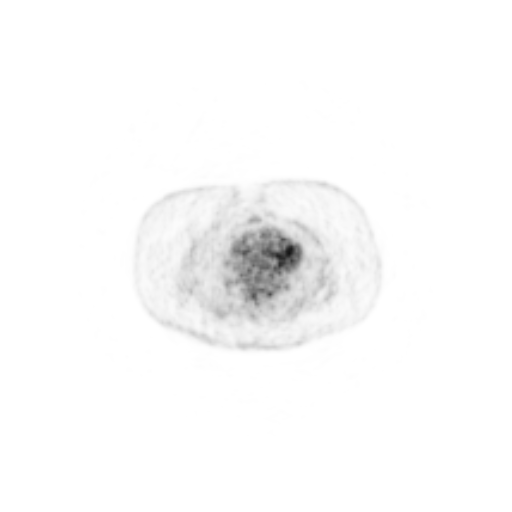
[im 193/289]
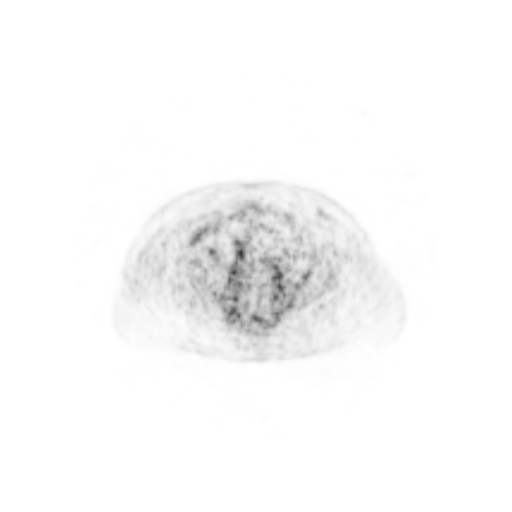
[im 289/289]
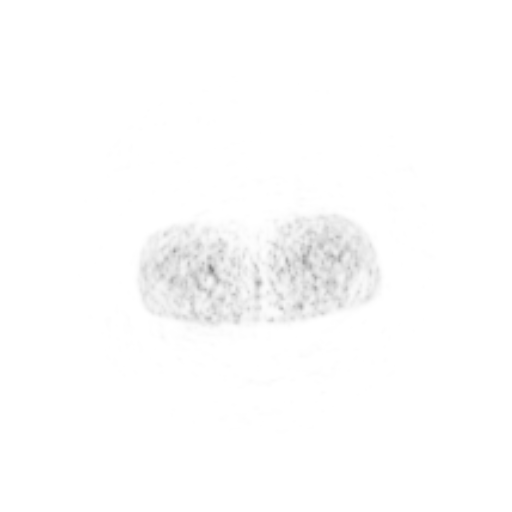

[Series 607: pet coronal · 1 of 110 slices shown]
[im 1/110]
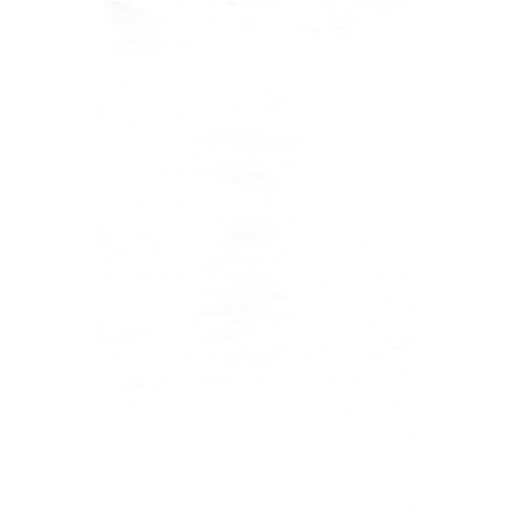

[Series 608: pet sagittal · 2 of 176 slices shown]
[im 1/176]
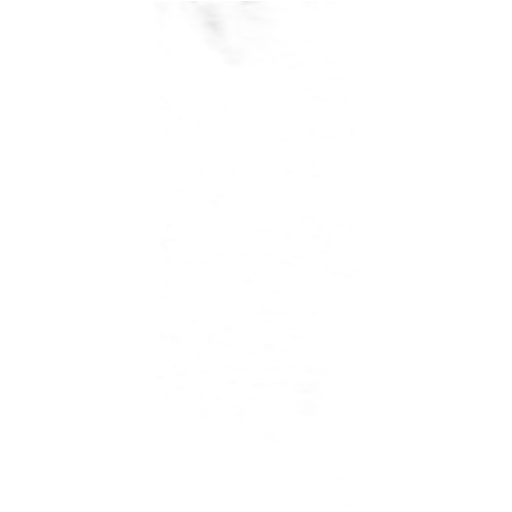
[im 176/176]
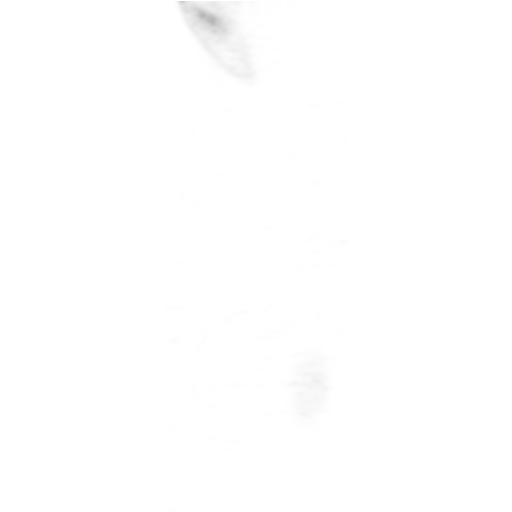

[25 of 25 positions shown; findings below may reference images not displayed]

FINDINGS: Mediastinal blood pool activity: SUV max

NECK: No hypermetabolic lymph nodes in the neck.

Incidental CT findings: None

CHEST: No hypermetabolic mediastinal or hilar nodes. No suspicious
pulmonary nodules on the CT scan.

Incidental CT findings: none

ABDOMEN/PELVIS: LEFT periaortic retroperitoneal enlarged lymph node
measuring 19 mm (image 162/4) and has intense metabolic activity
with SUV max equal 13.5. This is the highest (cephalad) metastatic
lymph node in relation to the pelvis. Cystic lesion at the same
level on the RIGHT adjacent to the IVC has metabolic activity along
the superior margin with SUV max equal 9.3. This lesion measures
by 1.9 cm predominantly cystic.

Hypermetabolic RIGHT external iliac lymph node on the RIGHT measures
10 mm (image 198/4 )with SUV max equal 7.1. No clear additional
hypermetabolic pelvic nodes.

No abnormal activity in liver.

Activity along the ventral abdominal wall related recent
laparoscopic surgery.

Incidental CT findings: Post hysterectomy

SKELETON: No focal hypermetabolic activity to suggest skeletal
metastasis.

Incidental CT findings: none
IMPRESSION: 1. Metastatic adenopathy in the periaortic retroperitoneum and RIGHT
external iliac chain.
2. No evidence of liver metastasis.
3. No evidence of metastatic disease outside of the abdomen pelvis.

## 2020-01-11 ENCOUNTER — Other Ambulatory Visit: Payer: Medicare Other

## 2020-01-12 ENCOUNTER — Other Ambulatory Visit: Payer: Self-pay

## 2020-01-12 ENCOUNTER — Encounter: Payer: Self-pay | Admitting: Oncology

## 2020-01-12 NOTE — Progress Notes (Signed)
Patient stated that she had been doing well with no complaints. 

## 2020-01-13 ENCOUNTER — Inpatient Hospital Stay: Payer: Medicare Other

## 2020-01-13 ENCOUNTER — Other Ambulatory Visit: Payer: Self-pay | Admitting: *Deleted

## 2020-01-13 ENCOUNTER — Inpatient Hospital Stay: Payer: Medicare Other | Attending: Oncology | Admitting: Oncology

## 2020-01-13 ENCOUNTER — Other Ambulatory Visit: Payer: Self-pay

## 2020-01-13 ENCOUNTER — Ambulatory Visit
Admission: RE | Admit: 2020-01-13 | Discharge: 2020-01-13 | Disposition: A | Discharge status: Home / Self Care | Payer: Medicare Other | Source: Ambulatory Visit | Attending: Radiation Oncology | Admitting: Radiation Oncology

## 2020-01-13 VITALS — BP 102/42 | HR 57 | Temp 96.5°F | Resp 16 | Wt 229.0 lb

## 2020-01-13 DIAGNOSIS — D638 Anemia in other chronic diseases classified elsewhere: Secondary | ICD-10-CM

## 2020-01-13 DIAGNOSIS — Z8616 Personal history of COVID-19: Secondary | ICD-10-CM | POA: Diagnosis not present

## 2020-01-13 DIAGNOSIS — J45909 Unspecified asthma, uncomplicated: Secondary | ICD-10-CM | POA: Diagnosis not present

## 2020-01-13 DIAGNOSIS — I10 Essential (primary) hypertension: Secondary | ICD-10-CM | POA: Insufficient documentation

## 2020-01-13 DIAGNOSIS — Z9079 Acquired absence of other genital organ(s): Secondary | ICD-10-CM | POA: Insufficient documentation

## 2020-01-13 DIAGNOSIS — G620 Drug-induced polyneuropathy: Secondary | ICD-10-CM | POA: Diagnosis not present

## 2020-01-13 DIAGNOSIS — Z8249 Family history of ischemic heart disease and other diseases of the circulatory system: Secondary | ICD-10-CM | POA: Diagnosis not present

## 2020-01-13 DIAGNOSIS — Z9221 Personal history of antineoplastic chemotherapy: Secondary | ICD-10-CM | POA: Diagnosis not present

## 2020-01-13 DIAGNOSIS — Z90710 Acquired absence of both cervix and uterus: Secondary | ICD-10-CM | POA: Diagnosis not present

## 2020-01-13 DIAGNOSIS — Z8673 Personal history of transient ischemic attack (TIA), and cerebral infarction without residual deficits: Secondary | ICD-10-CM | POA: Diagnosis not present

## 2020-01-13 DIAGNOSIS — E785 Hyperlipidemia, unspecified: Secondary | ICD-10-CM | POA: Diagnosis not present

## 2020-01-13 DIAGNOSIS — C541 Malignant neoplasm of endometrium: Secondary | ICD-10-CM

## 2020-01-13 DIAGNOSIS — Z809 Family history of malignant neoplasm, unspecified: Secondary | ICD-10-CM | POA: Diagnosis not present

## 2020-01-13 DIAGNOSIS — Z08 Encounter for follow-up examination after completed treatment for malignant neoplasm: Secondary | ICD-10-CM | POA: Diagnosis not present

## 2020-01-13 DIAGNOSIS — Z79899 Other long term (current) drug therapy: Secondary | ICD-10-CM | POA: Insufficient documentation

## 2020-01-13 DIAGNOSIS — F419 Anxiety disorder, unspecified: Secondary | ICD-10-CM | POA: Diagnosis not present

## 2020-01-13 DIAGNOSIS — D259 Leiomyoma of uterus, unspecified: Secondary | ICD-10-CM | POA: Diagnosis not present

## 2020-01-13 DIAGNOSIS — Z8542 Personal history of malignant neoplasm of other parts of uterus: Secondary | ICD-10-CM

## 2020-01-13 DIAGNOSIS — Z923 Personal history of irradiation: Secondary | ICD-10-CM | POA: Insufficient documentation

## 2020-01-13 DIAGNOSIS — Z90722 Acquired absence of ovaries, bilateral: Secondary | ICD-10-CM | POA: Diagnosis not present

## 2020-01-13 DIAGNOSIS — F329 Major depressive disorder, single episode, unspecified: Secondary | ICD-10-CM | POA: Insufficient documentation

## 2020-01-13 DIAGNOSIS — T451X5A Adverse effect of antineoplastic and immunosuppressive drugs, initial encounter: Secondary | ICD-10-CM

## 2020-01-13 LAB — CBC WITH DIFFERENTIAL/PLATELET
Abs Immature Granulocytes: 0.01 10*3/uL (ref 0.00–0.07)
Basophils Absolute: 0 10*3/uL (ref 0.0–0.1)
Basophils Relative: 1 %
Eosinophils Absolute: 0.1 10*3/uL (ref 0.0–0.5)
Eosinophils Relative: 3 %
HCT: 34.3 % — ABNORMAL LOW (ref 36.0–46.0)
Hemoglobin: 11 g/dL — ABNORMAL LOW (ref 12.0–15.0)
Immature Granulocytes: 0 %
Lymphocytes Relative: 34 %
Lymphs Abs: 1.2 10*3/uL (ref 0.7–4.0)
MCH: 30.2 pg (ref 26.0–34.0)
MCHC: 32.1 g/dL (ref 30.0–36.0)
MCV: 94.2 fL (ref 80.0–100.0)
Monocytes Absolute: 0.3 10*3/uL (ref 0.1–1.0)
Monocytes Relative: 8 %
Neutro Abs: 1.9 10*3/uL (ref 1.7–7.7)
Neutrophils Relative %: 54 %
Platelets: 237 10*3/uL (ref 150–400)
RBC: 3.64 MIL/uL — ABNORMAL LOW (ref 3.87–5.11)
RDW: 12.8 % (ref 11.5–15.5)
WBC: 3.5 10*3/uL — ABNORMAL LOW (ref 4.0–10.5)
nRBC: 0 % (ref 0.0–0.2)

## 2020-01-13 LAB — COMPREHENSIVE METABOLIC PANEL
ALT: 14 U/L (ref 0–44)
AST: 22 U/L (ref 15–41)
Albumin: 4.1 g/dL (ref 3.5–5.0)
Alkaline Phosphatase: 78 U/L (ref 38–126)
Anion gap: 10 (ref 5–15)
BUN: 16 mg/dL (ref 8–23)
CO2: 28 mmol/L (ref 22–32)
Calcium: 9.1 mg/dL (ref 8.9–10.3)
Chloride: 102 mmol/L (ref 98–111)
Creatinine, Ser: 0.86 mg/dL (ref 0.44–1.00)
GFR calc Af Amer: >60 mL/min (ref >60)
GFR calc non Af Amer: >60 mL/min (ref >60)
Glucose, Bld: 126 mg/dL — ABNORMAL HIGH (ref 70–99)
Potassium: 3.4 mmol/L — ABNORMAL LOW (ref 3.5–5.1)
Sodium: 140 mmol/L (ref 135–145)
Total Bilirubin: 0.5 mg/dL (ref 0.3–1.2)
Total Protein: 7 g/dL (ref 6.5–8.1)

## 2020-01-13 LAB — IRON AND TIBC
Iron: 71 ug/dL (ref 28–170)
Saturation Ratios: 22 % (ref 10.4–31.8)
TIBC: 318 ug/dL (ref 250–450)
UIBC: 247 ug/dL

## 2020-01-13 LAB — FERRITIN: Ferritin: 82 ng/mL (ref 11–307)

## 2020-01-13 NOTE — Progress Notes (Signed)
Radiation Oncology Follow up Note  Name: Molly [name]   Date:   01/13/2020 MRN:  [id number] DOB: Oct 03, 1951    This 69 y.o. female presents to the clinic today for 71-month follow-up status post both pelvic and periaortic radiation therapy for stage IIIc endometrial carcinoma.  REFERRING PROVIDER: Steele Sizer, MD  HPI: Patient is a 69 year old female now out 6 months having completed chemotherapy along with periaortic and pelvic lymph radiation for stage IIIc endometrial carcinoma.  Seen today in routine follow-up she is doing well.  She specifically denies any increased lower urinary tract symptoms diarrhea or vaginal discharge..  CT scan which I have reviewed back in October showed patient status post hysterectomy with no evidence of residual or recurrent adenopathy.  No evidence of metastatic disease was noted.  She did have some linear groundglass airspace changes within both lungs with atypical infection cannot be excluded.  She has no cough fever.  Patient has had Covid  COMPLICATIONS OF TREATMENT: none  FOLLOW UP COMPLIANCE: keeps appointments   PHYSICAL EXAM:  There were no vitals taken for this visit. Well-developed well-nourished patient in NAD. HEENT reveals PERLA, EOMI, discs not visualized.  Oral cavity is clear. No oral mucosal lesions are identified. Neck is clear without evidence of cervical or supraclavicular adenopathy. Lungs are clear to A&P. Cardiac examination is essentially unremarkable with regular rate and rhythm without murmur rub or thrill. Abdomen is benign with no organomegaly or masses noted. Motor sensory and DTR levels are equal and symmetric in the upper and lower extremities. Cranial nerves II through XII are grossly intact. Proprioception is intact. No peripheral adenopathy or edema is identified. No motor or sensory levels are noted. Crude visual fields are within normal range.  RADIOLOGY RESULTS: CT scans reviewed compatible with above-stated  findings  PLAN: Present time patient is doing well with no evidence of disease now at 6 months and pleased with her overall progress.  I have asked to see her back in 6 months for follow-up and will obtain repeat CT scans at that time.  She continues close follow-up care with GYN oncology.  Patient knows to call with any concerns.  I would like to take this opportunity to thank you for allowing me to participate in the care of your patient.Noreene Filbert, MD

## 2020-01-13 NOTE — Progress Notes (Signed)
Pt doing good no gyn concerns

## 2020-01-16 NOTE — Progress Notes (Signed)
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 Marcelline Mates for cystocele and rectocele. She was noted to have postmenopausal bleeding and cramping recently. Ultrasound revealed heterogeneous appearance of uterus with fibroids invading the endometrium. She underwent an endometrial biopsy when she had a second bout of postmenopausal bleeding. Biopsy showed endometrioid adenocarcinoma FIGO grade 1. She was then seen by Dr. Fransisca Connors and plan was to proceed with laparoscopic hysterectomy and bilateral salpingo-oophorectomy along with pelvic lymph node sampling  Final pathology showed: Endometrioid carcinoma FIGO grade 2 with 33% myometrial invasion. Lymphovascular invasion present. Peritoneal/ascites fluid atypical. 2 out of 3 sentinel lymph nodes were positive for macro metastases. pT1a pN1a. FIGO IIIC1  Patient's case was discussed at tumor board and consensus was to proceed with 3 cycles of adjuvant carbotaxol followed by radiation followed by 3 more cycles of chemotherapy. Patient is already met with Dr. Donella Stade who will be giving radiation therapy to pelvic and periaortic lymph nodes and boost to her vaginal cuff. PET CT  scan showed metastatic adenopathy and retroperitoneal and external iliac group of lymph nodes in the right side. No evidence of metastatic disease elsewhere.  First cycle of chemotherapy was given on 11/05/2018.She completed 6 cycles on 02/25/2019.She completed whole pelvic radiation in June 2020.   Interval history-does report tingling numbness in her hands and feet which has remained overall stable.  She is currently on Lyrica 75 mg twice a day.  Appetite and weight are stable.  Denies any abdominal pain or distention  ECOG PS- 1 Pain scale- 0   Review of systems- Review of Systems  Constitutional: Positive for malaise/fatigue. Negative for chills, fever and weight loss.  HENT: Negative for congestion, ear discharge and nosebleeds.   Eyes: Negative for blurred vision.  Respiratory: Negative for cough, hemoptysis, sputum production, shortness of breath and wheezing.   Cardiovascular: Negative for chest pain, palpitations, orthopnea and claudication.  Gastrointestinal: Negative for abdominal pain, blood in stool, constipation, diarrhea, heartburn, melena, nausea and vomiting.  Genitourinary: Negative for dysuria, flank pain, frequency, hematuria and urgency.  Musculoskeletal: Negative for back pain, joint pain and myalgias.  Skin: Negative for rash.  Neurological: Positive for sensory change (peripheral neuropathy). Negative for dizziness, tingling, focal weakness, seizures, weakness and headaches.  Endo/Heme/Allergies: Does not bruise/bleed easily.  Psychiatric/Behavioral: Negative for depression and suicidal ideas. The patient does not have insomnia.       Allergies  Allergen Reactions  . Ferumoxytol Anaphylaxis  . Nsaids Hives and Rash  . Statins Other (See Comments)    Joint pains  . Victoza [Liraglutide] Other (See Comments)    pancreatitis  . Oxycodone Nausea And Vomiting  . Crestor [Rosuvastatin Calcium] Rash     Past Medical History:  Diagnosis Date  . Anxiety     . Asthma    history of asthma  . Cardiomyopathy ([place])   .  Complication of anesthesia    tore hair out and made teeth rough  . COVID-19 virus infection 09/08/2019  . Depression   . Endometrial cancer ([place]) 08/2018  . GERD (gastroesophageal reflux disease)    takes prilosec prn  . History of CVA (cerebrovascular accident) 12/10/2015  . Hyperlipidemia LDL goal <70 12/10/2015  . Hypertension   . Prediabetes 10/02/2017   A1c 6 in January 2018  . Stroke [facility]) 1990    no residual effects     Past Surgical History:  Procedure Laterality Date  . ABDOMINAL HYSTERECTOMY    . CHOLECYSTECTOMY    . COLONOSCOPY WITH PROPOFOL N/A 01/08/2017   Procedure: COLONOSCOPY WITH PROPOFOL;  Surgeon: [name], MD;  Location: [facility] ENDOSCOPY;  Service: Endoscopy;  Laterality: N/A;  . COLONOSCOPY WITH PROPOFOL N/A 05/05/2018   Procedure: COLONOSCOPY WITH PROPOFOL;  Surgeon: [name], MD;  Location: [facility] ENDOSCOPY;  Service: Gastroenterology;  Laterality: N/A;  . HERNIA REPAIR  [id number]   umbilical  . PORTACATH PLACEMENT N/A 11/04/2018   Procedure: INSERTION PORT-A-CATH;  Surgeon: Jules Husbands, MD;  Location: [facility] ORS;  Service: General;  Laterality: N/A;  . SENTINEL NODE BIOPSY N/A 10/06/2018   Procedure: SENTINEL NODE BIOPSY;  Surgeon: Mellody Drown, MD;  Location: [facility] ORS;  Service: Gynecology;  Laterality: N/A;  . UMBILICAL HERNIA REPAIR N/A 11/17/2017   Procedure: HERNIA REPAIR UMBILICAL ADULT;  Surgeon: Jules Husbands, MD;  Location: [facility] ORS;  Service: General;  Laterality: N/A;    Social History   Socioeconomic History  . Marital status: Divorced    Spouse name: Not on file  . Number of children: 2  . Years of education: some college  . Highest education level: 12th grade  Occupational History  . 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 Occupation: currently a Theme park manager  Tobacco Use  . Smoking status: Never Smoker  . Smokeless tobacco: Never Used  . Tobacco  comment: smoking cessation materials not required  Substance and Sexual Activity  . Alcohol use: No    Alcohol/week: 0.0 standard drinks  . Drug use: No  . Sexual activity: Not Currently  Other Topics Concern  . Not on file  Social History Narrative  . Not on file   Social Determinants of Health   Financial Resource Strain:   . Difficulty of Paying Living Expenses: Not on file  Food Insecurity:   . Worried About Charity fundraiser in the Last Year: Not on file  . Ran Out of Food in the Last Year: Not on file  Transportation Needs:   . Lack of Transportation (Medical): Not on file  . Lack of Transportation (Non-Medical): Not on file  Physical Activity: Inactive  . Days of Exercise per Week: 0 days  . Minutes of Exercise per Session: 0 min  Stress:   . Feeling of Stress : Not on file  Social Connections:   . Frequency of Communication with Friends and Family: Not on file  . Frequency of Social Gatherings with Friends and Family: Not on file  . Attends Religious Services: Not on file  . Active Member of Clubs or Organizations: Not on file  . Attends Archivist Meetings: Not on file  . Marital Status: Not on file  Intimate Partner Violence:   . Fear of Current or Ex-Partner: Not on file  . Emotionally Abused: Not on file  . Physically Abused: Not on file  . Sexually Abused: Not  on file    Family History  Problem Relation Age of Onset  . Stroke Mother   . Hypertension Mother   . Dementia Mother   . Alzheimer's disease Mother   . Gout Father   . Asthma Father   . Hypertension Father   . Dementia Father   . Healthy Sister   . Stroke Brother   . Alzheimer's disease Brother   . Healthy Daughter   . Hypertension Brother   . Healthy Brother   . Cancer Paternal Grandmother   . Healthy Sister   . Healthy Sister   . Hypertension Sister   . Hypertension Sister   . Stroke Brother   . Alzheimer's disease Brother   . Stroke Brother   . Hypertension Brother     . Stroke Brother   . Hypertension Brother   . Healthy Brother   . Healthy Brother   . Hypertension Daughter   . Breast cancer Neg Hx      Current Outpatient Medications:  .  escitalopram (LEXAPRO) 10 MG tablet, Take 1 tablet by mouth daily., Disp: 30 tablet, Rfl: 2 .  ezetimibe (ZETIA) 10 MG tablet, Take 1 tablet by mouth daily., Disp: 30 tablet, Rfl: 2 .  ketoconazole (NIZORAL) 2 % cream, Apply 1 application topically daily as needed for irritation. On abdominal fold prn, Disp: 60 g, Rfl: 0 .  loratadine (CLARITIN) 10 MG tablet, Take 1 tablet by mouth daily., Disp: 30 tablet, Rfl: 4 .  olmesartan-hydrochlorothiazide (BENICAR HCT) 20-12.5 MG tablet, Take 1 tablet by mouth daily., Disp: 90 tablet, Rfl: 1 .  Potassium 99 MG TABS, Take 1 tablet by mouth daily., Disp: , Rfl:  .  pregabalin (LYRICA) 75 MG capsule, Take 1 capsule by mouth twice daily., Disp: 60 capsule, Rfl: 0 .  acetaminophen (TYLENOL) 500 MG tablet, Take 2 tablets (1,000 mg total) by mouth every 6 (six) hours as needed for mild pain or moderate pain. (Patient not taking: Reported on 01/12/2020), Disp: 30 tablet, Rfl: 0 .  albuterol (PROVENTIL HFA;VENTOLIN HFA) 108 (90 Base) MCG/ACT inhaler, Inhale 2 puffs into the lungs every 4 (four) hours as needed for wheezing or shortness of breath. (Patient not taking: Reported on 01/12/2020), Disp: 1 Inhaler, Rfl: 0 .  furosemide (LASIX) 40 MG tablet, Take 1 tablet by mouth daily as needed for fluid. (Patient not taking: Reported on 01/12/2020), Disp: 30 tablet, Rfl: 2 .  magnesium oxide (MAG-OX) 400 (241.3 Mg) MG tablet, Take 1 tablet (400 mg total) by mouth daily. For hypomagnesemia (Patient not taking: Reported on 01/12/2020), Disp: 30 tablet, Rfl: 0 .  omeprazole (PRILOSEC OTC) 20 MG tablet, Take 20 mg by mouth daily as needed., Disp: , Rfl:  No current facility-administered medications for this visit.  Facility-Administered Medications Ordered in Other Visits:  .  sodium chloride flush  (NS) 0.9 % injection 10 mL, 10 mL, Intravenous, PRN, Sindy Guadeloupe, MD, 10 mL at 11/15/18 0915 .  sodium chloride flush (NS) 0.9 % injection 10 mL, 10 mL, Intracatheter, Once PRN, Sindy Guadeloupe, MD  Physical exam:  Vitals:   01/13/20 0849  BP: (!) 102/42  Pulse: (!) 57  Resp: 16  Temp: (!) 96.5 F (35.8 C)  TempSrc: Tympanic  Weight: 229 lb (103.9 kg)   Physical Exam HENT:     Head: Normocephalic and atraumatic.  Eyes:     Pupils: Pupils are equal, round, and reactive to light.  Cardiovascular:     Rate and Rhythm: Normal rate  and regular rhythm.     Heart sounds: Normal heart sounds.  Pulmonary:     Effort: Pulmonary effort is normal.     Breath sounds: Normal breath sounds.  Abdominal:     General: Bowel sounds are normal. There is no distension.     Palpations: Abdomen is soft.     Tenderness: There is no abdominal tenderness.  Musculoskeletal:     Cervical back: Normal range of motion.  Skin:    General: Skin is warm and dry.  Neurological:     Mental Status: She is alert and oriented to person, place, and time.      CMP Latest Ref Rng & Units 01/13/2020  Glucose 70 - 99 mg/dL 126(H)  BUN 8 - 23 mg/dL 16  Creatinine 0.44 - 1.00 mg/dL 0.86  Sodium 135 - 145 mmol/L 140  Potassium 3.5 - 5.1 mmol/L 3.4(L)  Chloride 98 - 111 mmol/L 102  CO2 22 - 32 mmol/L 28  Calcium 8.9 - 10.3 mg/dL 9.1  Total Protein 6.5 - 8.1 g/dL 7.0  Total Bilirubin 0.3 - 1.2 mg/dL 0.5  Alkaline Phos 38 - 126 U/L 78  AST 15 - 41 U/L 22  ALT 0 - 44 U/L 14   CBC Latest Ref Rng & Units 01/13/2020  WBC 4.0 - 10.5 K/uL 3.5(L)  Hemoglobin 12.0 - 15.0 g/dL 11.0(L)  Hematocrit 36.0 - 46.0 % 34.3(L)  Platelets 150 - 400 K/uL 237      Assessment and plan- Patient is a 69 y.o. female with stage IIIc grade 2 endometrial carcinoma with bilateral positive pelvic sentinel lymph node s/p TLH/BSO with pelvic sentinel lymph node biopsies in October 2019 followed by adjuvant chemotherapy and radiation  treatment.   She is here for routine follow-up  Clinically patient is doing well and there are no concerning signs and symptoms of recurrence based on today's exam.  Pelvic exam was not done.  Last CA-125 in October 2020 was normal.  Scans will be done only when clinically concern arises.  Last CT scan in October 2020 did not show any evidence of recurrence.  I will see her back in 6 months with CBC with differential CMP and CA-125.  She will also be seen GYN oncology in 3 months time for a vaginal exam.  Chemo-induced peripheral neuropathy: Remains stable overall.  Continue Lyrica   Visit Diagnosis 1. Encounter for follow-up surveillance of endometrial cancer   2. Chemotherapy-induced peripheral neuropathy ([place])      Dr. Randa Evens, MD, MPH [facility] [phone] 01/16/2020 8:27 AM

## 2020-02-24 ENCOUNTER — Encounter: Payer: Self-pay | Admitting: Radiation Oncology

## 2020-03-28 ENCOUNTER — Other Ambulatory Visit: Payer: Self-pay | Admitting: Family Medicine

## 2020-03-28 ENCOUNTER — Telehealth: Payer: Self-pay | Admitting: Family Medicine

## 2020-03-28 DIAGNOSIS — T451X5A Adverse effect of antineoplastic and immunosuppressive drugs, initial encounter: Secondary | ICD-10-CM

## 2020-03-28 DIAGNOSIS — I10 Essential (primary) hypertension: Secondary | ICD-10-CM

## 2020-03-28 DIAGNOSIS — G620 Drug-induced polyneuropathy: Secondary | ICD-10-CM

## 2020-03-28 NOTE — Telephone Encounter (Signed)
Requested medication (s) are due for refill today: yes  Requested medication (s) are on the active medication list: yes  Last refill: 01/02/20  Future visit scheduled: no  Notes to clinic:  not delegated    Requested Prescriptions  Pending Prescriptions Disp Refills   pregabalin (LYRICA) 75 MG capsule [Pharmacy Med Name: Pregabalin 75mg  Capsule] 60 capsule     Sig: Take 1 capsule by mouth twice daily.      Not Delegated - Neurology:  Anticonvulsants - Controlled Failed - 03/28/2020  2:11 PM      Failed - This refill cannot be delegated      Passed - Valid encounter within last 12 months    Recent Outpatient Visits           6 months ago Cough   [facility] Steele Sizer, MD   7 months ago Mild recurrent major depression [facility])   [facility] Steele Sizer, MD   8 months ago Exposure to [facility] Steele Sizer, MD   10 months ago Mild recurrent major depression [facility])   [facility] Steele Sizer, MD   1 year ago Atherosclerosis of right coronary artery   [facility] Steele Sizer, MD

## 2020-03-28 NOTE — Telephone Encounter (Signed)
Courtesy refill. Left pt. A message to schedule an appointment.

## 2020-03-29 NOTE — Telephone Encounter (Signed)
Pt is scheduled °

## 2020-04-03 ENCOUNTER — Telehealth: Payer: Self-pay | Admitting: Family Medicine

## 2020-04-03 NOTE — Telephone Encounter (Signed)
Left message for patient to schedule Annual Wellness Visit.  Please schedule with Nurse Health Advisor Victoria Britt, RN at [place]  

## 2020-04-13 ENCOUNTER — Other Ambulatory Visit: Payer: Self-pay

## 2020-04-13 ENCOUNTER — Ambulatory Visit (INDEPENDENT_AMBULATORY_CARE_PROVIDER_SITE_OTHER): Payer: Medicare Other | Admitting: Family Medicine

## 2020-04-13 ENCOUNTER — Encounter: Payer: Self-pay | Admitting: Family Medicine

## 2020-04-13 VITALS — BP 140/70 | HR 82 | Temp 96.9°F | Resp 16 | Ht 59.0 in | Wt 231.0 lb

## 2020-04-13 DIAGNOSIS — J4520 Mild intermittent asthma, uncomplicated: Secondary | ICD-10-CM

## 2020-04-13 DIAGNOSIS — G620 Drug-induced polyneuropathy: Secondary | ICD-10-CM

## 2020-04-13 DIAGNOSIS — I10 Essential (primary) hypertension: Secondary | ICD-10-CM | POA: Diagnosis not present

## 2020-04-13 DIAGNOSIS — I700 Atherosclerosis of aorta: Secondary | ICD-10-CM | POA: Diagnosis not present

## 2020-04-13 DIAGNOSIS — I43 Cardiomyopathy in diseases classified elsewhere: Secondary | ICD-10-CM

## 2020-04-13 DIAGNOSIS — D638 Anemia in other chronic diseases classified elsewhere: Secondary | ICD-10-CM

## 2020-04-13 DIAGNOSIS — R7303 Prediabetes: Secondary | ICD-10-CM

## 2020-04-13 DIAGNOSIS — J3089 Other allergic rhinitis: Secondary | ICD-10-CM

## 2020-04-13 DIAGNOSIS — Z789 Other specified health status: Secondary | ICD-10-CM

## 2020-04-13 DIAGNOSIS — F330 Major depressive disorder, recurrent, mild: Secondary | ICD-10-CM

## 2020-04-13 DIAGNOSIS — J302 Other seasonal allergic rhinitis: Secondary | ICD-10-CM

## 2020-04-13 DIAGNOSIS — T451X5A Adverse effect of antineoplastic and immunosuppressive drugs, initial encounter: Secondary | ICD-10-CM

## 2020-04-13 DIAGNOSIS — R600 Localized edema: Secondary | ICD-10-CM

## 2020-04-13 DIAGNOSIS — M170 Bilateral primary osteoarthritis of knee: Secondary | ICD-10-CM

## 2020-04-13 DIAGNOSIS — R739 Hyperglycemia, unspecified: Secondary | ICD-10-CM

## 2020-04-13 DIAGNOSIS — R296 Repeated falls: Secondary | ICD-10-CM | POA: Diagnosis not present

## 2020-04-13 DIAGNOSIS — I119 Hypertensive heart disease without heart failure: Secondary | ICD-10-CM

## 2020-04-13 DIAGNOSIS — E7800 Pure hypercholesterolemia, unspecified: Secondary | ICD-10-CM

## 2020-04-13 DIAGNOSIS — K219 Gastro-esophageal reflux disease without esophagitis: Secondary | ICD-10-CM

## 2020-04-13 DIAGNOSIS — Z8542 Personal history of malignant neoplasm of other parts of uterus: Secondary | ICD-10-CM

## 2020-04-13 MED ORDER — PREGABALIN 50 MG PO CAPS: 50.0000 mg | ORAL_CAPSULE | Freq: Two times a day (BID) | ORAL | 0 refills | Status: DC

## 2020-04-13 MED ORDER — FUROSEMIDE 40 MG PO TABS: 40.0000 mg | ORAL_TABLET | Freq: Every day | ORAL | 0 refills | Status: DC

## 2020-04-13 MED ORDER — OLMESARTAN MEDOXOMIL-HCTZ 20-12.5 MG PO TABS: 1.0000 | ORAL_TABLET | Freq: Every day | ORAL | 0 refills | Status: DC

## 2020-04-13 MED ORDER — PANTOPRAZOLE SODIUM 20 MG PO TBEC: 20.0000 mg | DELAYED_RELEASE_TABLET | Freq: Every day | ORAL | 0 refills | Status: DC

## 2020-04-13 MED ORDER — EZETIMIBE 10 MG PO TABS: 10.0000 mg | ORAL_TABLET | Freq: Every day | ORAL | 0 refills | Status: DC

## 2020-04-13 MED ORDER — ESCITALOPRAM OXALATE 10 MG PO TABS: 10.0000 mg | ORAL_TABLET | Freq: Every day | ORAL | 0 refills | Status: DC

## 2020-04-13 MED ORDER — POTASSIUM CHLORIDE CRYS ER 20 MEQ PO TBCR: 20.0000 meq | EXTENDED_RELEASE_TABLET | Freq: Every day | ORAL | 0 refills | Status: DC

## 2020-04-13 MED ORDER — LEVOCETIRIZINE DIHYDROCHLORIDE 5 MG PO TABS: 5.0000 mg | ORAL_TABLET | Freq: Every evening | ORAL | 0 refills | Status: DC

## 2020-04-13 MED ORDER — ALBUTEROL SULFATE HFA 108 (90 BASE) MCG/ACT IN AERS: 2.0000 | INHALATION_SPRAY | RESPIRATORY_TRACT | 0 refills | Status: DC | PRN

## 2020-04-13 NOTE — Progress Notes (Signed)
Name: Molly [name]   MRN: [id number]    DOB: May 23, 1951   Date:04/13/2020       Progress Note  Subjective  Chief Complaint  Chief Complaint  Patient presents with  . Medication Refill  . Allergic Rhinitis     Claritin not working for her. She has been taking Zyrtec.    HPI  Asthma: mild intermittent, no cough , sob or wheezingat this time, she has allergies year round and worse this of the year, taking zyrtec and loratadine, we will try switching to xyzal  Recurrent Falls: patient did not initially disclose her falls, but daughter called earlier to report the incidents over the past few months. She denies feeling dizzy. She states she fell first time at a hotel tub, once at home - states tripped on cords and once at work, she walked backwards and trip over the stove door that was open ( the stove was not on ).She never lost consciousness, once she hit her head and had mild headache. She has OA and neuropathy - taking Lyrica. Discussed fall prevention, we will decrease dose of Lyrica to 50 mg in am and 100 mg in pm since it can cause sedation, we will send her to PT to increase mobility and evaluation, she feels her quads are weak.   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   Cardiomyopathy without [id number] extremity edema at little worse because she has been standing more and has been taking lasix daily and only otc potassium , advised to switch rx potassium to prevent hypokalemia.   Endometrial cancer: s/p hysterectomy,finished the chemo and radiation therapy with Dr. Baruch Gouty , no longer has a port. Doing well .   Morbid obesity: off victoza because of episode of pancreatitis, also has prediabetes,she was on metformin but we stopped medication since  diagnosed with endometrial cancer because she had nausea and was losing weight, discussed going back on medication now. She gained from 213 lbs to 231 lbs. She states since endometrial cancer her appetite is back , can taste food again. She will resume metformin she has at home and let me known the dose prior to asking for refills.   Dyslipidemia: cannot tolerate statin therapy, but is taking Zetia, reviewed last labs , LDL still above 100, discussed healthy diet   HTN:bp is at goal, no chest pain, dizziness,or palpitation. BP is slightly up today but we will monitor for now   OA: she states her legs ache and quads are not very strong, still working in her restaurant and knees are painful all day, anterior knee, sharp intermittent but intense.   Atherosclerosis aorta: on zetia, because she cannot tolerate statin therapy, she denies side effects of medication  GERD: she is currently taking omeprazole otc, she states symptoms of indigestion and heartburn present when she skips doses.   Patient Active Problem List   Diagnosis Date Noted  . 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 Immunosuppressed status ([place]) 01/11/2019  . Iron deficiency anemia 11/15/2018  . Endometrial adenocarcinoma ([place]) 10/29/2018  . Umbilical hernia without obstruction and without gangrene   . Atherosclerosis of right coronary artery 11/10/2017  . Atherosclerosis of abdominal aorta ([place]) 11/10/2017  . Prediabetes 10/02/2017  .  Pain in limb 03/31/2017  . Perennial allergic rhinitis with seasonal variation 05/14/2016  . Asthma, mild intermittent, well-controlled 05/14/2016  . Hypertension goal BP (blood pressure) < 140/90 12/10/2015  . Cardiomyopathy due to hypertension ([place]) 12/10/2015  . History of CVA (cerebrovascular accident) 12/10/2015  . Hyperlipidemia LDL goal <70 12/10/2015  . Statin intolerance 12/10/2015    Past Surgical History:  Procedure  Laterality Date  . ABDOMINAL HYSTERECTOMY    . CHOLECYSTECTOMY    . COLONOSCOPY WITH PROPOFOL N/A 01/08/2017   Procedure: COLONOSCOPY WITH PROPOFOL;  Surgeon: [name], MD;  Location: [facility] ENDOSCOPY;  Service: Endoscopy;  Laterality: N/A;  . COLONOSCOPY WITH PROPOFOL N/A 05/05/2018   Procedure: COLONOSCOPY WITH PROPOFOL;  Surgeon: [name], MD;  Location: [facility] ENDOSCOPY;  Service: Gastroenterology;  Laterality: N/A;  . HERNIA REPAIR  [id number]   umbilical  . PORTACATH PLACEMENT N/A 11/04/2018   Procedure: INSERTION PORT-A-CATH;  Surgeon: Jules Husbands, MD;  Location: [facility] ORS;  Service: General;  Laterality: N/A;  . SENTINEL NODE BIOPSY N/A 10/06/2018   Procedure: SENTINEL NODE BIOPSY;  Surgeon: Mellody Drown, MD;  Location: [facility] ORS;  Service: Gynecology;  Laterality: N/A;  . UMBILICAL HERNIA REPAIR N/A 11/17/2017   Procedure: HERNIA REPAIR UMBILICAL ADULT;  Surgeon: Jules Husbands, MD;  Location: [facility] ORS;  Service: General;  Laterality: N/A;    Family History  Problem Relation Age of Onset  . Stroke Mother   . Hypertension Mother   . Dementia Mother   . Alzheimer's disease Mother   . Gout Father   . Asthma Father   . Hypertension Father   . Dementia Father   . Healthy Sister   . Stroke Brother   . Alzheimer's disease Brother   . Healthy Daughter   . Hypertension Brother   . Healthy Brother   . Cancer Paternal Grandmother   . Healthy Sister   . Healthy Sister   . Hypertension Sister   . Hypertension Sister   . Stroke Brother   . Alzheimer's disease Brother   . Stroke Brother   . Hypertension Brother   . Stroke Brother   . Hypertension Brother   . Healthy Brother   . Healthy Brother   . Hypertension Daughter   . Breast cancer Neg Hx     Social History   Tobacco Use  . Smoking status: Never Smoker  . Smokeless tobacco: Never Used  . Tobacco comment: smoking cessation materials not required  Substance Use Topics  . Alcohol use: No    Alcohol/week: 0.0  standard drinks     Current Outpatient Medications:  .  acetaminophen (TYLENOL) 500 MG tablet, Take 2 tablets (1,000 mg total) by mouth every 6 (six) hours as needed for mild pain or moderate pain., Disp: 30 tablet, Rfl: 0 .  albuterol (PROVENTIL HFA;VENTOLIN HFA) 108 (90 Base) MCG/ACT inhaler, Inhale 2 puffs into the lungs every 4 (four) hours as needed for wheezing or shortness of breath., Disp: 1 Inhaler, Rfl: 0 .  escitalopram (LEXAPRO) 10 MG tablet, Take 1 tablet by mouth daily., Disp: 30 tablet, Rfl: 2 .  ezetimibe (ZETIA) 10 MG tablet, Take 1 tablet by mouth daily., Disp: 30 tablet, Rfl: 2 .  furosemide (LASIX) 40 MG tablet, Take 1 tablet by mouth daily as needed for fluid., Disp: 30 tablet, Rfl: 2 .  ketoconazole (NIZORAL) 2 % cream, Apply 1 application topically daily as needed for irritation. On abdominal fold prn, Disp: 60 g, Rfl: 0 .  loratadine (CLARITIN)  10 MG tablet, Take 1 tablet by mouth daily., Disp: 30 tablet, Rfl: 4 .  olmesartan-hydrochlorothiazide (BENICAR HCT) 20-12.5 MG tablet, Take 1 tablet by mouth daily., Disp: 30 tablet, Rfl: 0 .  omeprazole (PRILOSEC OTC) 20 MG tablet, Take 20 mg by mouth daily as needed., Disp: , Rfl:  .  Potassium 99 MG TABS, Take 1 tablet by mouth daily., Disp: , Rfl:  .  pregabalin (LYRICA) 75 MG capsule, Take 1 capsule by mouth twice daily., Disp: 60 capsule, Rfl: 0 .  magnesium oxide (MAG-OX) 400 (241.3 Mg) MG tablet, Take 1 tablet (400 mg total) by mouth daily. For hypomagnesemia (Patient not taking: Reported on 04/13/2020), Disp: 30 tablet, Rfl: 0 No current facility-administered medications for this visit.  Facility-Administered Medications Ordered in Other Visits:  .  sodium chloride flush (NS) 0.9 % injection 10 mL, 10 mL, Intravenous, PRN, Sindy Guadeloupe, MD, 10 mL at 11/15/18 0915 .  sodium chloride flush (NS) 0.9 % injection 10 mL, 10 mL, Intracatheter, Once PRN, Sindy Guadeloupe, MD  Allergies  Allergen Reactions  . Ferumoxytol  Anaphylaxis  . Nsaids Hives and Rash  . Statins Other (See Comments)    Joint pains  . Victoza [Liraglutide] Other (See Comments)    pancreatitis  . Oxycodone Nausea And Vomiting  . Crestor [Rosuvastatin Calcium] Rash    I personally reviewed active problem list, medication list, allergies, family history, social history, health maintenance with the patient/caregiver today.   ROS  Constitutional: Negative for fever , positive for weight change.  Respiratory: Negative for cough and shortness of breath.   Cardiovascular: Negative for chest pain or palpitations.  Gastrointestinal: Negative for abdominal pain, no bowel changes.  Musculoskeletal: Positive for gait problem but no joint swelling.  Skin: Negative for rash.  Neurological: Negative for dizziness or headache.  No other specific complaints in a complete review of systems (except as listed in HPI above).  Objective  Vitals:   04/13/20 1533  BP: 140/70  Pulse: 82  Resp: 16  Temp: (!) 96.9 F (36.1 C)  TempSrc: Temporal  SpO2: 94%  Weight: 231 lb (104.8 kg)  Height: 4\' 11"  (1.499 m)    Body mass index is 46.66 kg/m.  Physical Exam  Constitutional: Patient appears well-developed and well-nourished. Obese  No distress.  HEENT: head atraumatic, normocephalic, pupils equal and reactive to light Cardiovascular: Normal rate, regular rhythm and normal heart sounds.  No murmur heard. 1 plus  BLE edema. Pulmonary/Chest: Effort normal and breath sounds normal. No respiratory distress. Abdominal: Soft.  There is no tenderness. Muscular Skeletal: crepitus with extension of both knees, no effusion or redness, antalgic gait Psychiatric: Patient has a normal mood and affect. behavior is normal. Judgment and thought content normal.  PHQ2/9: Depression screen [facility] 2/9 04/13/2020 09/08/2019 08/15/2019 07/27/2019 05/13/2019  Decreased Interest 0 0 0 0 0  Down, Depressed, Hopeless 0 0 0 1 0  PHQ - 2 Score 0 0 0 1 0  Altered sleeping  0 0 0 0 0  Tired, decreased energy 0 0 0 1 0  Change in appetite 0 0 0 0 0  Feeling bad or failure about yourself  0 0 0 0 0  Trouble concentrating 0 0 0 0 0  Moving slowly or fidgety/restless 0 0 0 0 0  Suicidal thoughts 0 0 0 0 0  PHQ-9 Score 0 0 0 2 0  Difficult doing work/chores - - - - -  Some recent data might be hidden  phq 9 is negative  Fall Risk: Fall Risk  04/13/2020 09/08/2019 07/27/2019 05/13/2019 04/07/2019  Falls in the past year? 0 0 0 0 0  Number falls in past yr: 0 0 0 0 0  Injury with Fall? 0 0 0 0 0  Risk for fall due to : - - - - -  Risk for fall due to: Comment - - - - -  Follow up - - Falls evaluation completed - Falls prevention discussed     Functional Status Survey: Is the patient deaf or have difficulty hearing?: No Does the patient have difficulty seeing, even when wearing glasses/contacts?: No Does the patient have difficulty concentrating, remembering, or making decisions?: No Does the patient have difficulty walking or climbing stairs?: No Does the patient have difficulty dressing or bathing?: No Does the patient have difficulty doing errands alone such as visiting a doctor's office or shopping?: No   Assessment & Plan  1. Recurrent falls  Referral PT  2. Mild intermittent asthma without complication  - albuterol (VENTOLIN HFA) 108 (90 Base) MCG/ACT inhaler; Inhale 2 puffs into the lungs every 4 (four) hours as needed for wheezing or shortness of breath.  Dispense: 18 g; Refill: 0  3. Hypertension goal BP (blood pressure) < 140/90  - CBC with Differential/Platelet - COMPLETE METABOLIC PANEL WITH GFR - olmesartan-hydrochlorothiazide (BENICAR HCT) 20-12.5 MG tablet; Take 1 tablet by mouth daily.  Dispense: 30 tablet; Refill: 0 - furosemide (LASIX) 40 MG tablet; Take 1 tablet (40 mg total) by mouth daily.  Dispense: 30 tablet; Refill: 0  4. Atherosclerosis of abdominal aorta (HCC)  - ezetimibe (ZETIA) 10 MG tablet; Take 1 tablet (10 mg  total) by mouth daily.  Dispense: 30 tablet; Refill: 0  5. Morbid obesity ([place])  Discussed with the patient the risk posed by an increased BMI. Discussed importance of portion control, calorie counting and at least 150 minutes of physical activity weekly. Avoid sweet beverages and drink more water. Eat at least 6 servings of fruit and vegetables daily   6. Pure hypercholesterolemia  On Zetia   7. Statin intolerance   8. Prediabetes  She will resume metformin she has at home and tell me the dose  9. Hyperglycemia   10. Anemia of chronic disease  - CBC with Differential/Platelet  11. Chemotherapy-induced neuropathy (HCC)  - pregabalin (LYRICA) 50 MG capsule; Take 1-2 capsules (50-100 mg total) by mouth 2 (two) times daily. 1 in am and 2 at night  Dispense: 90 capsule; Refill: 0 - Ambulatory referral to Physical Therapy  12. Bilateral lower extremity edema  - furosemide (LASIX) 40 MG tablet; Take 1 tablet (40 mg total) by mouth daily.  Dispense: 30 tablet; Refill: 0 - potassium chloride SA (KLOR-CON) 20 MEQ tablet; Take 1 tablet (20 mEq total) by mouth daily.  Dispense: 30 tablet; Refill: 0  13. Cardiomyopathy due to hypertension, without heart failure (HCC)  - furosemide (LASIX) 40 MG tablet; Take 1 tablet (40 mg total) by mouth daily.  Dispense: 30 tablet; Refill: 0  14. Mild recurrent major depression (HCC)  - escitalopram (LEXAPRO) 10 MG tablet; Take 1 tablet (10 mg total) by mouth daily.  Dispense: 30 tablet; Refill: 0  15. GERD without esophagitis  - pantoprazole (PROTONIX) 20 MG tablet; Take 1 tablet (20 mg total) by mouth daily.  Dispense: 30 tablet; Refill: 0  16. Perennial allergic rhinitis with seasonal variation  - levocetirizine (XYZAL) 5 MG tablet; Take 1 tablet (5 mg total) by mouth  every evening.  Dispense: 30 tablet; Refill: 0  17. History of endometrial cancer   18. Primary osteoarthritis of both knees  - Ambulatory referral to Physical Therapy

## 2020-04-14 LAB — COMPLETE METABOLIC PANEL WITH GFR
AG Ratio: 1.8 (calc) (ref 1.0–2.5)
ALT: 10 U/L (ref 6–29)
AST: 16 U/L (ref 10–35)
Albumin: 4.4 g/dL (ref 3.6–5.1)
Alkaline phosphatase (APISO): 76 U/L (ref 37–153)
BUN: 18 mg/dL (ref 7–25)
CO2: 27 mmol/L (ref 20–32)
Calcium: 9.7 mg/dL (ref 8.6–10.4)
Chloride: 102 mmol/L (ref 98–110)
Creat: 0.86 mg/dL (ref 0.50–0.99)
GFR, Est African American: 80 mL/min/{1.73_m2} (ref >=60)
GFR, Est Non African American: 69 mL/min/{1.73_m2} (ref >=60)
Globulin: 2.4 g/dL (calc) (ref 1.9–3.7)
Glucose, Bld: 146 mg/dL — ABNORMAL HIGH (ref 65–99)
Potassium: 3.3 mmol/L — ABNORMAL LOW (ref 3.5–5.3)
Sodium: 141 mmol/L (ref 135–146)
Total Bilirubin: 0.4 mg/dL (ref 0.2–1.2)
Total Protein: 6.8 g/dL (ref 6.1–8.1)

## 2020-04-14 LAB — CBC WITH DIFFERENTIAL/PLATELET
Absolute Monocytes: 306 cells/uL (ref 200–950)
Basophils Absolute: 19 cells/uL (ref 0–200)
Basophils Relative: 0.4 %
Eosinophils Absolute: 61 cells/uL (ref 15–500)
Eosinophils Relative: 1.3 %
HCT: 34.8 % — ABNORMAL LOW (ref 35.0–45.0)
Hemoglobin: 11.2 g/dL — ABNORMAL LOW (ref 11.7–15.5)
Lymphs Abs: 1368 cells/uL (ref 850–3900)
MCH: 29.6 pg (ref 27.0–33.0)
MCHC: 32.2 g/dL (ref 32.0–36.0)
MCV: 91.8 fL (ref 80.0–100.0)
MPV: 10 fL (ref 7.5–12.5)
Monocytes Relative: 6.5 %
Neutro Abs: 2947 cells/uL (ref 1500–7800)
Neutrophils Relative %: 62.7 %
Platelets: 296 10*3/uL (ref 140–400)
RBC: 3.79 10*6/uL — ABNORMAL LOW (ref 3.80–5.10)
RDW: 13.6 % (ref 11.0–15.0)
Total Lymphocyte: 29.1 %
WBC: 4.7 10*3/uL (ref 3.8–10.8)

## 2020-04-16 NOTE — Progress Notes (Signed)
Pt present for follow up ovarian cancer treatment.  Pt stated that she was doing well no problems.

## 2020-04-17 ENCOUNTER — Ambulatory Visit: Payer: Medicare Other | Admitting: Obstetrics and Gynecology

## 2020-04-17 ENCOUNTER — Encounter: Payer: Self-pay | Admitting: Obstetrics and Gynecology

## 2020-04-17 ENCOUNTER — Other Ambulatory Visit: Payer: Self-pay

## 2020-04-17 VITALS — BP 126/74 | HR 71 | Ht 59.0 in | Wt 231.2 lb

## 2020-04-17 DIAGNOSIS — Z90710 Acquired absence of both cervix and uterus: Secondary | ICD-10-CM

## 2020-04-17 DIAGNOSIS — Z6841 Body Mass Index (BMI) 40.0 and over, adult: Secondary | ICD-10-CM | POA: Diagnosis not present

## 2020-04-17 DIAGNOSIS — C541 Malignant neoplasm of endometrium: Secondary | ICD-10-CM

## 2020-04-17 NOTE — Progress Notes (Signed)
    GYNECOLOGY PROGRESS NOTE  Subjective:    Patient ID: Molly [name], female    DOB: 1951/06/23, 69 y.o.   MRN: [id number]  HPI  Patient is a 69 y.o. [id number] female who presents for 6 month follow up of endometrial cancer.  She was diagnosed with endometrial cancer in September 2019 after several bouts of post-menopausal bleeding with pessary use for pelvic organ prolapse. She underwent a total laparoscopic hysterectomy with bilateral salpingo-oophorectomy with pelvic lymph node dissection on 10/06/2018.  She has undergone radiation and chemotherapy.   Final pathology showed: Endometrioid carcinoma FIGO grade 2 with 33% myometrial invasion.  Lymphovascular invasion present.  Peritoneal/ascites fluid atypical.  2 out of 3 sentinel lymph nodes were positive for macro metastases.  pT1a pN1a.  FIGO IIIC1.   Patient today denies any complaints. Notes she did not get mammogram scheduled as previously ordered.   The following portions of the patient's history were reviewed and updated as appropriate: allergies, current medications, past family history, past medical history, past social history, past surgical history and problem list.  Review of Systems Pertinent items noted in HPI and remainder of comprehensive ROS otherwise negative.   Objective:   Blood pressure 126/74, pulse 71, height 4\' 11"  (1.499 m), weight 231 lb 3.2 oz (104.9 kg).  Body mass index is 46.7 kg/m.   General appearance: alert and no distress.  Abdomen: soft, non-tender; bowel sounds normal; no masses,  no organomegaly. Large well-healed scar across right mid-abdomen.  Pelvic: external genitalia normal.  Rectovaginal septum normal.  Vagina without discharge.  No lesions or nodularity noted. Uterus and cervix surgically absent. Rectum: Normal external sphincter, normal tone, no masses. Small amount of stool noted in rectal vault.  Lymph nodes: Cervical, supraclavicular, and inguinal nodes normal. Extremities: extremities  normal, atraumatic, no cyanosis or edema Neurologic: Grossly normal   Assessment:   Endometrial cancer s/p LAVH/BSO with lymph node dissection. Is s/p radiation treatment and chemotherapy. Doing well.   Plan:   - Doing well. No evidence of disease noted on today's exam. Will need to continue q 3-4 month follow up alternating with Oncology and GYN.  Notes she has an appointments with Oncology tomorrow.  - Patient overdue for mammogram.  Did not schedule after recommended last visit.  Made patient an appointment for [facility] for April 26th.    Rubie Maid, MD [facility]

## 2020-04-17 NOTE — Patient Instructions (Signed)
Mammogram °A mammogram is an X-ray of the breasts that is done to check for changes that are not normal. This test can screen for and find any changes that may suggest breast cancer. Mammograms are regularly done on women. A man may have a mammogram if he has a lump or swelling in his breast. This test can also help to find other changes and variations in the breast. °Tell a doctor: °· About any allergies you have. °· If you have breast implants. °· If you have had previous breast disease, biopsy, or surgery. °· If you are breastfeeding. °· If you are younger than age 25. °· If you have a family history of breast cancer. °· Whether you are pregnant or may be pregnant. °What are the risks? °Generally, this is a safe procedure. However, problems may occur, including: °· Exposure to radiation. Radiation levels are very low with this test. °· The results being misinterpreted. °· The need for further tests. °· The inability of the mammogram to detect certain cancers. °What happens before the procedure? °· Have this test done about 1-2 weeks after your period. This is usually when your breasts are the least tender. °· If you are visiting a new doctor or clinic, send any past mammogram images to your new doctor's office. °· Wash your breasts and under your arms the day of the test. °· Do not use deodorants, perfumes, lotions, or powders on the day of the test. °· Take off any jewelry from your neck. °· Wear clothes that you can change into and out of easily. °What happens during the procedure? ° °· You will undress from the waist up. You will put on a gown. °· You will stand in front of the X-ray machine. °· Each breast will be placed between two plastic or glass plates. The plates will press down on your breast for a few seconds. Try to stay as relaxed as possible. This does not cause any harm to your breasts. Any discomfort you feel will be very brief. °· X-rays will be taken from different angles of each breast. °The  procedure may vary among doctors and hospitals. °What happens after the procedure? °· The mammogram will be read by a specialist (radiologist). °· You may need to do certain parts of the test again. This depends on the quality of the images. °· Ask when your test results will be ready. Make sure you get your test results. °· You may go back to your normal activities. °Summary °· A mammogram is a low energy X-ray of the breasts that is done to check for abnormal changes. A man may have this test if he has a lump or swelling in his breast. °· Before the procedure, tell your doctor about any breast problems that you have had in the past. °· Have this test done about 1-2 weeks after your period. °· For the test, each breast will be placed between two plastic or glass plates. The plates will press down on your breast for a few seconds. °· The mammogram will be read by a specialist (radiologist). Ask when your test results will be ready. Make sure you get your test results. °This information is not intended to replace advice given to you by your health care provider. Make sure you discuss any questions you have with your health care provider. °Document Revised: 08/05/2018 Document Reviewed: 08/05/2018 °Elsevier Patient Education © 2020 [employer] ° °

## 2020-04-18 ENCOUNTER — Telehealth: Payer: Self-pay

## 2020-04-18 ENCOUNTER — Inpatient Hospital Stay: Payer: Medicare Other

## 2020-04-23 ENCOUNTER — Ambulatory Visit
Admission: RE | Admit: 2020-04-23 | Discharge: 2020-04-23 | Disposition: A | Discharge status: Home / Self Care | Payer: Medicare Other | Source: Ambulatory Visit | Attending: Obstetrics and Gynecology | Admitting: Obstetrics and Gynecology

## 2020-04-23 DIAGNOSIS — Z1231 Encounter for screening mammogram for malignant neoplasm of breast: Secondary | ICD-10-CM | POA: Diagnosis not present

## 2020-04-30 ENCOUNTER — Ambulatory Visit: Payer: Medicare Other | Attending: Family Medicine | Admitting: Physical Therapy

## 2020-04-30 ENCOUNTER — Other Ambulatory Visit: Payer: Self-pay | Admitting: Family Medicine

## 2020-04-30 DIAGNOSIS — M25562 Pain in left knee: Secondary | ICD-10-CM | POA: Insufficient documentation

## 2020-04-30 DIAGNOSIS — I43 Cardiomyopathy in diseases classified elsewhere: Secondary | ICD-10-CM

## 2020-04-30 DIAGNOSIS — K219 Gastro-esophageal reflux disease without esophagitis: Secondary | ICD-10-CM

## 2020-04-30 DIAGNOSIS — I119 Hypertensive heart disease without heart failure: Secondary | ICD-10-CM

## 2020-04-30 DIAGNOSIS — G8929 Other chronic pain: Secondary | ICD-10-CM | POA: Insufficient documentation

## 2020-04-30 DIAGNOSIS — R269 Unspecified abnormalities of gait and mobility: Secondary | ICD-10-CM | POA: Insufficient documentation

## 2020-04-30 DIAGNOSIS — J302 Other seasonal allergic rhinitis: Secondary | ICD-10-CM

## 2020-04-30 DIAGNOSIS — G620 Drug-induced polyneuropathy: Secondary | ICD-10-CM

## 2020-04-30 DIAGNOSIS — R600 Localized edema: Secondary | ICD-10-CM

## 2020-04-30 DIAGNOSIS — M6281 Muscle weakness (generalized): Secondary | ICD-10-CM | POA: Insufficient documentation

## 2020-04-30 DIAGNOSIS — M25561 Pain in right knee: Secondary | ICD-10-CM | POA: Insufficient documentation

## 2020-04-30 DIAGNOSIS — I10 Essential (primary) hypertension: Secondary | ICD-10-CM

## 2020-04-30 DIAGNOSIS — T451X5A Adverse effect of antineoplastic and immunosuppressive drugs, initial encounter: Secondary | ICD-10-CM

## 2020-04-30 NOTE — Telephone Encounter (Signed)
Requested medication (s) are due for refill today: no  Requested medication (s) are on the active medication list: yes   Future visit scheduled: no  Notes to clinic:  this refill cannot be delegated   Requested Prescriptions  Pending Prescriptions Disp Refills   pregabalin (LYRICA) 50 MG capsule [Pharmacy Med Name: Pregabalin 50mg  Capsule] 90 capsule     Sig: Take 1 capsule by mouth every morning, and, take 2 capsules every night.      Not Delegated - Neurology:  Anticonvulsants - Controlled Failed - 04/30/2020  6:15 AM      Failed - This refill cannot be delegated      Passed - Valid encounter within last 12 months    Recent Outpatient Visits           2 weeks ago Recurrent falls   [facility] Steele Sizer, MD   7 months ago Cough   [facility] Steele Sizer, MD   8 months ago Mild recurrent major depression [facility])   [facility] Steele Sizer, MD   9 months ago Exposure to [facility] Steele Sizer, MD   11 months ago Mild recurrent major depression [facility])   [facility] Steele Sizer, MD       Future Appointments             In 5 months Steele Sizer, MD [facility]             Signed Prescriptions Disp Refills   levocetirizine (XYZAL) 5 MG tablet 30 tablet 0    Sig: Take 1 tablet by mouth every evening.      Ear, Nose, and Throat:  Antihistamines Passed - 04/30/2020  6:15 AM      Passed - Valid encounter within last 12 months    Recent Outpatient Visits           2 weeks ago Recurrent falls   [facility] Steele Sizer, MD   7 months ago Cough   [facility], Drue Stager, MD   8 months ago Mild recurrent major depression [facility])   [facility] Steele Sizer, MD   9 months ago Exposure to [facility] Steele Sizer, MD   11 months ago Mild recurrent major depression [facility])   [facility] Sizer, MD       Future Appointments             In 5 months Steele Sizer, MD [facility]              furosemide (LASIX) 40 MG tablet 30 tablet 0    Sig: Take 1 tablet by mouth daily.      Cardiovascular:  Diuretics - Loop Failed - 04/30/2020  6:15 AM      Failed - K in normal range and within 360 days    Potassium  Date Value Ref Range Status  04/13/2020 3.3 (L) 3.5 - 5.3 mmol/L Final          Passed - Ca in normal range and within 360 days    Calcium  Date Value Ref Range Status  04/13/2020 9.7 8.6 - 10.4 mg/dL Final          Passed - Na in normal range and within 360 days    Sodium  Date Value Ref Range  Status  04/13/2020 141 135 - 146 mmol/L Final          Passed - Cr in normal range and within 360 days    Creat  Date Value Ref Range Status  04/13/2020 0.86 0.50 - 0.99 mg/dL Final    Comment:    For patients >86 years of age, the reference limit for Creatinine is approximately 13% higher for people identified as African-American. .           Passed - Last BP in normal range    BP Readings from Last 1 Encounters:  04/17/20 126/74          Passed - Valid encounter within last 6 months    Recent Outpatient Visits           2 weeks ago Recurrent falls   [facility] Steele Sizer, MD   7 months ago Cough   [facility] Steele Sizer, MD   8 months ago Mild recurrent major depression [facility])   [facility] Sizer, MD   9 months ago Exposure to [facility] Steele Sizer, MD   11 months ago Mild recurrent major depression [facility])   [facility] Steele Sizer, MD       Future Appointments             In 5 months Steele Sizer, MD [facility]               pantoprazole (PROTONIX) 20 MG tablet 30 tablet 0    Sig: Take 1 tablet by mouth daily.      Gastroenterology: Proton Pump Inhibitors Passed - 04/30/2020  6:15 AM      Passed - Valid encounter within last 12 months    Recent Outpatient Visits           2 weeks ago Recurrent falls   [facility] Steele Sizer, MD   7 months ago Cough   [facility], Drue Stager, MD   8 months ago Mild recurrent major depression [facility])   [facility] Sizer, MD   9 months ago Exposure to [facility] Steele Sizer, MD   11 months ago Mild recurrent major depression [facility])   [facility] Sizer, MD       Future Appointments             In 5 months Steele Sizer, MD [facility]              potassium chloride SA (KLOR-CON) 20 MEQ tablet 30 tablet 0    Sig: Take 1 tablet by mouth daily.      Endocrinology:  Minerals - Potassium Supplementation Failed - 04/30/2020  6:15 AM      Failed - K in normal range and within 360 days    Potassium  Date Value Ref Range Status  04/13/2020 3.3 (L) 3.5 - 5.3 mmol/L Final          Passed - Cr in normal range and within 360 days    Creat  Date Value Ref Range Status  04/13/2020 0.86 0.50 - 0.99 mg/dL Final    Comment:    For patients >51 years of age, the reference limit for Creatinine is approximately 13% higher for people identified as African-American. Marland Kitchen  Passed - Valid encounter within last 12 months    Recent Outpatient Visits           2 weeks ago Recurrent falls   [facility] Sizer, MD   7 months ago Cough   [facility]izer, MD   8 months ago Mild recurrent major depression [facility])   [facility] Sizer, MD   9 months ago Exposure to [place] Steele Sizer, MD   11 months ago Mild recurrent major depression [facility])   [facility] Steele Sizer, MD       Future Appointments             In 5 months Ancil Boozer, Drue Stager, MD [facility]

## 2020-04-30 NOTE — Telephone Encounter (Signed)
error 

## 2020-05-03 ENCOUNTER — Ambulatory Visit: Payer: Medicare Other | Admitting: Physical Therapy

## 2020-05-03 ENCOUNTER — Other Ambulatory Visit: Payer: Self-pay

## 2020-05-03 ENCOUNTER — Ambulatory Visit (INDEPENDENT_AMBULATORY_CARE_PROVIDER_SITE_OTHER): Payer: Medicare Other

## 2020-05-03 DIAGNOSIS — Z780 Asymptomatic menopausal state: Secondary | ICD-10-CM

## 2020-05-03 DIAGNOSIS — G8929 Other chronic pain: Secondary | ICD-10-CM | POA: Diagnosis not present

## 2020-05-03 DIAGNOSIS — M25561 Pain in right knee: Secondary | ICD-10-CM | POA: Diagnosis not present

## 2020-05-03 DIAGNOSIS — M8580 Other specified disorders of bone density and structure, unspecified site: Secondary | ICD-10-CM

## 2020-05-03 DIAGNOSIS — Z598 Other problems related to housing and economic circumstances: Secondary | ICD-10-CM

## 2020-05-03 DIAGNOSIS — M25562 Pain in left knee: Secondary | ICD-10-CM

## 2020-05-03 DIAGNOSIS — Z599 Problem related to housing and economic circumstances, unspecified: Secondary | ICD-10-CM

## 2020-05-03 DIAGNOSIS — M6281 Muscle weakness (generalized): Secondary | ICD-10-CM | POA: Diagnosis not present

## 2020-05-03 DIAGNOSIS — Z0000 Encounter for general adult medical examination without abnormal findings: Secondary | ICD-10-CM

## 2020-05-03 DIAGNOSIS — R269 Unspecified abnormalities of gait and mobility: Secondary | ICD-10-CM

## 2020-05-03 NOTE — Patient Instructions (Signed)
Access Code: [email or web address]: [email or web address]: 05/06/2021Prepared by: Legrand Como SherkExercises  Supine Quad Set - 1 x daily - 5 x weekly - 1 sets - 10 reps - 10 sseconds hold  Small Range Straight Leg Raise - 1 x daily - 5 x weekly - 1 sets - 10 reps  Supine Heel Slides - 1 x daily - 5 x weekly - 1 sets - 10 reps  Beginner Bridge - 1 x daily - 5 x weekly - 1 sets - 10 reps

## 2020-05-03 NOTE — Progress Notes (Signed)
Subjective:   Molly [name] is a 69 y.o. female who presents for Medicare Annual (Subsequent) preventive examination.  Virtual Visit via Telephone Note  I connected with  Molly [name] on [id number] at  8:40 AM EDT by telephone and verified that I am speaking with the correct person using two identifiers.  Medicare Annual Wellness visit completed telephonically due to Covid-19 pandemic.   Location: Patient: home Provider: office   I discussed the limitations, risks, security and privacy concerns of performing an evaluation and management service by telephone and the availability of in person appointments. The patient expressed understanding and agreed to proceed.  Unable to perform video visit due to patient does not have video capability.   Some vital signs may be absent or patient reported.   Clemetine Marker, LPN    Review of Systems:   Cardiac Risk Factors include: advanced age (>90men, >49 women);hypertension;obesity (BMI >30kg/m2);dyslipidemia     Objective:     Vitals: There were no vitals taken for this visit.  There is no height or weight on file to calculate BMI.  Advanced Directives 05/03/2020 01/12/2020 10/18/2019 10/07/2019 08/09/2019 07/18/2019 06/22/2019  Does Patient Have a Medical Advance Directive? Yes No No No No No No  Type of Paramedic of [place];Living will - - - - - -  Copy of [place] in Chart? No - copy requested - - - - - -  Would patient like information on creating a medical advance directive? - No - Patient declined No - Patient declined No - Patient declined No - Patient declined No - Patient declined No - Patient declined    Tobacco Social History   Tobacco Use  Smoking Status Never Smoker  Smokeless Tobacco Never Used  Tobacco Comment   smoking cessation materials not required     Counseling given: Not Answered Comment: smoking cessation materials not required   Clinical Intake:  Pre-visit  preparation completed: Yes  Pain : No/denies pain     Nutritional Risks: None Diabetes: No  How often do you need to have someone help you when you read instructions, pamphlets, or other written materials from your doctor or pharmacy?: 1 - Never  Interpreter Needed?: No  Information entered by :: Clemetine Marker LPN  Past Medical History:  Diagnosis Date  . Anxiety   . Asthma    history of asthma  . Cardiomyopathy ([place])   . Complication of anesthesia    tore hair out and made teeth rough  . COVID-19 virus infection 09/08/2019  . Depression   . Endometrial cancer ([place]) 08/2018  . GERD (gastroesophageal reflux disease)    takes prilosec prn  . History of CVA (cerebrovascular accident) 12/10/2015  . Hyperlipidemia LDL goal <70 12/10/2015  . Hypertension   . Prediabetes 10/02/2017   A1c 6 in January 2018  . Stroke [facility]) 1990    no residual effects   Past Surgical History:  Procedure Laterality Date  . ABDOMINAL HYSTERECTOMY    . CHOLECYSTECTOMY    . COLONOSCOPY WITH PROPOFOL N/A 01/08/2017   Procedure: COLONOSCOPY WITH PROPOFOL;  Surgeon: [name], MD;  Location: [facility] ENDOSCOPY;  Service: Endoscopy;  Laterality: N/A;  . COLONOSCOPY WITH PROPOFOL N/A 05/05/2018   Procedure: COLONOSCOPY WITH PROPOFOL;  Surgeon: [name], MD;  Location: [facility] ENDOSCOPY;  Service: Gastroenterology;  Laterality: N/A;  . HERNIA REPAIR  [id number]   umbilical  . PORTACATH PLACEMENT N/A 11/04/2018   Procedure: INSERTION PORT-A-CATH;  Surgeon: Dahlia Byes,  Marjory Lies, MD;  Location: [facility] ORS;  Service: General;  Laterality: N/A;  . SENTINEL NODE BIOPSY N/A 10/06/2018   Procedure: SENTINEL NODE BIOPSY;  Surgeon: Mellody Drown, MD;  Location: [facility] ORS;  Service: Gynecology;  Laterality: N/A;  . UMBILICAL HERNIA REPAIR N/A 11/17/2017   Procedure: HERNIA REPAIR UMBILICAL ADULT;  Surgeon: Jules Husbands, MD;  Location: [facility] ORS;  Service: General;  Laterality: N/A;   Family History  Problem Relation Age of Onset   . Stroke Mother   . Hypertension Mother   . Dementia Mother   . Alzheimer's disease Mother   . Gout Father   . Asthma Father   . Hypertension Father   . Dementia Father   . Healthy Sister   . Stroke Brother   . Alzheimer's disease Brother   . Healthy Daughter   . Hypertension Brother   . Healthy Brother   . Cancer Paternal Grandmother   . Healthy Sister   . Healthy Sister   . Hypertension Sister   . Hypertension Sister   . Stroke Brother   . Alzheimer's disease Brother   . Stroke Brother   . Hypertension Brother   . Stroke Brother   . Hypertension Brother   . Healthy Brother   . Healthy Brother   . Hypertension Daughter   . Breast cancer Neg Hx    Social History   Socioeconomic History  . Marital status: Divorced    Spouse name: Not on file  . Number of children: 2  . Years of education: some college  . Highest education level: 12th grade  Occupational History  . 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 Occupation: currently a Theme park manager  Tobacco Use  . Smoking status: Never Smoker  . Smokeless tobacco: Never Used  . Tobacco comment: smoking cessation materials not required  Substance and Sexual Activity  . Alcohol use: No    Alcohol/week: 0.0 standard drinks  . Drug use: No  . Sexual activity: Not Currently  Other Topics Concern  . Not on file  Social History Narrative  . Not on file   Social Determinants of Health   Financial Resource Strain: Medium Risk  . Difficulty of Paying Living Expenses: Somewhat hard  Food Insecurity: No Food Insecurity  . Worried About Charity fundraiser in the Last Year: Never true  . Ran Out of Food in the Last Year: Never true  Transportation Needs: No Transportation Needs  . Lack of Transportation (Medical): No  . Lack of Transportation (Non-Medical): No  Physical Activity: Inactive  . Days of Exercise per Week: 0 days  . Minutes of Exercise per Session: 0 min  Stress: No Stress Concern Present    . Feeling of Stress : Not at all  Social Connections: Slightly Isolated  . Frequency of Communication with Friends and Family: More than three times a week  . Frequency of Social Gatherings with Friends and Family: Three times a week  . Attends Religious Services: More than 4 times per year  . Active Member of Clubs or Organizations: Yes  . Attends Archivist Meetings: More than 4 times per year  . Marital Status: Divorced    Outpatient Encounter Medications as of 05/03/2020  Medication Sig  . acetaminophen (TYLENOL) 500 MG tablet Take 2 tablets (1,000 mg total) by mouth every 6 (six) hours as needed for mild pain or moderate pain.  Marland Kitchen albuterol (VENTOLIN HFA) 108 (90 Base)  MCG/ACT inhaler Inhale 2 puffs into the lungs every 4 (four) hours as needed for wheezing or shortness of breath.  . escitalopram (LEXAPRO) 10 MG tablet Take 1 tablet (10 mg total) by mouth daily.  Marland Kitchen ezetimibe (ZETIA) 10 MG tablet Take 1 tablet (10 mg total) by mouth daily.  . furosemide (LASIX) 40 MG tablet Take 1 tablet by mouth daily.  Marland Kitchen ketoconazole (NIZORAL) 2 % cream Apply 1 application topically daily as needed for irritation. On abdominal fold prn  . levocetirizine (XYZAL) 5 MG tablet Take 1 tablet by mouth every evening.  . olmesartan-hydrochlorothiazide (BENICAR HCT) 20-12.5 MG tablet Take 1 tablet by mouth daily.  Marland Kitchen omeprazole (PRILOSEC) 10 MG capsule Take 10 mg by mouth daily.  . pantoprazole (PROTONIX) 20 MG tablet Take 1 tablet by mouth daily.  . potassium chloride SA (KLOR-CON) 20 MEQ tablet Take 1 tablet by mouth daily.  . pregabalin (LYRICA) 50 MG capsule Take 1-2 capsules (50-100 mg total) by mouth 2 (two) times daily. 1 in am and 2 at night   No facility-administered encounter medications on file as of 05/03/2020.    Activities of Daily Living In your present state of health, do you have any difficulty performing the following activities: 05/03/2020 04/13/2020  Hearing? N N  Comment declines  hearing aids -  Vision? N N  Difficulty concentrating or making decisions? N N  Walking or climbing stairs? N N  Dressing or bathing? N N  Doing errands, shopping? N N  Preparing Food and eating ? N -  In the past six months, have you accidently leaked urine? Y -  Comment wears pads for protection -  Do you have problems with loss of bowel control? N -  Managing your Medications? N -  Managing your Finances? N -  Housekeeping or managing your Housekeeping? N -  Some recent data might be hidden    Patient Care Team: Steele Sizer, MD as PCP - General (Family Medicine) Rubie Maid, MD as Consulting Physician (Obstetrics and Gynecology) Clent Jacks, RN as Registered Nurse Noreene Filbert, MD as Referring Physician (Radiation Oncology) Sindy Guadeloupe, MD as Consulting Physician (Oncology) Mellody Drown, MD as Referring Physician (Obstetrics)    Assessment:   This is a routine wellness examination for Molly [name].  Exercise Activities and Dietary recommendations Current Exercise Habits: The patient does not participate in regular exercise at present, Exercise limited by: orthopedic condition(s)  Goals    . DIET - INCREASE WATER INTAKE     Recommend to drink at least 6-8 8oz glasses of water per day.       Fall Risk Fall Risk  05/03/2020 04/13/2020 09/08/2019 07/27/2019 05/13/2019  Falls in the past year? 1 0 0 0 0  Number falls in past yr: 1 1 0 0 0  Injury with Fall? 1 1 0 0 0  Risk for fall due to : History of fall(s);Orthopedic patient History of fall(s);Orthopedic patient - - -  Risk for fall due to: Comment - - - - -  Follow up Falls prevention discussed Education provided - Falls evaluation completed -   FALL RISK PREVENTION PERTAINING TO THE HOME:  Any stairs in or around the home? No  If so, do they handrails? No   Home free of loose throw rugs in walkways, pet beds, electrical cords, etc? Yes  Adequate lighting in your home to reduce risk of falls? Yes    ASSISTIVE DEVICES UTILIZED TO PREVENT FALLS:  Life alert? No  Use of a cane, walker or w/c? No  Grab bars in the bathroom? Yes  Shower chair or bench in shower? Yes  Elevated toilet seat or a handicapped toilet? No   DME ORDERS:  DME order needed?  No   TIMED UP AND GO:  Was the test performed? No . Telephonic visit.   Education: Fall risk prevention has been discussed.  Intervention(s) required? No    Depression Screen PHQ 2/9 Scores 05/03/2020 04/13/2020 09/08/2019 08/15/2019  PHQ - 2 Score 0 0 0 0  PHQ- 9 Score - 0 0 0     Cognitive Function     6CIT Screen 05/03/2020 04/07/2019 04/06/2018  What Year? 0 points 0 points 0 points  What month? 0 points 0 points 0 points  What time? 0 points 0 points 3 points  Count back from 20 0 points 0 points 0 points  Months in reverse 0 points 0 points 0 points  Repeat phrase 0 points 0 points 0 points  Total Score 0 0 3    Immunization History  Administered Date(s) Administered  . Fluad Quad(high Dose 65+) 10/05/2019  . Influenza, High Dose Seasonal PF 11/11/2016, 10/02/2017, 09/07/2018  . Pneumococcal Conjugate-13 04/06/2018  . Pneumococcal Polysaccharide-23 11/11/2016  . Tdap 11/11/2016    Qualifies for Shingles Vaccine? Yes . Due for Shingrix. Education has been provided regarding the importance of this vaccine. Pt has been advised to call insurance company to determine out of pocket expense. Advised may also receive vaccine at local pharmacy or Health Dept. Verbalized acceptance and understanding.  Tdap: Up to date  Flu Vaccine: Up to date  Pneumococcal Vaccine: Up to date  Covid-19 Vaccine: Due for Covid-19 vaccine. Education has been provided regarding the importance of this vaccine. Advised may receive this vaccine at local pharmacy, health department or Long vaccine clinic. Aware to provide a copy of the vaccination record if obtained from local pharmacy or health department. Verbalized acceptance and  understanding.    Screening Tests Health Maintenance  Topic Date Due  . COVID-19 Vaccine (1) Never done  . INFLUENZA VACCINE  07/29/2020  . MAMMOGRAM  04/23/2021  . COLONOSCOPY  05/05/2021  . TETANUS/TDAP  11/11/2026  . DEXA SCAN  Completed  . Hepatitis C Screening  Completed  . PNA vac Low Risk Adult  Completed    Cancer Screenings:  Colorectal Screening: Completed 05/05/18. Repeat every 3 years;   Mammogram: Completed 04/23/20. Repeat every year;   Bone Density: Completed 02/02/17. Results reflect OSTEOPENIA. Repeat every 2 years. Ordered today. Pt provided with contact information and advised to call to schedule appt.   Lung Cancer Screening: (Low Dose CT Chest recommended if Age 41-80 years, 30 pack-year currently smoking OR have quit w/in 15years.) does not qualify.    Additional Screening:  Hepatitis C Screening: does qualify; Completed 01/23/17  Vision Screening: Recommended annual ophthalmology exams for early detection of glaucoma and other disorders of the eye. Is the patient up to date with their annual eye exam?  No - postponed due to Covid Who is the provider or what is the name of the office in which the pt attends annual eye exams? Dr. Gloriann Loan  Dental Screening: Recommended annual dental exams for proper oral hygiene  Community Resource Referral:  CRR required this visit?  Yes     Plan:     I have personally reviewed and addressed the Medicare Annual Wellness questionnaire and have noted the following in the patient's chart:  A. Medical  and social history B. Use of alcohol, tobacco or illicit drugs  C. Current medications and supplements D. Functional ability and status E.  Nutritional status F.  Physical activity G. Advance directives H. List of other physicians I.  Hospitalizations, surgeries, and ER visits in previous 12 months J.  [place] such as hearing and vision if needed, cognitive and depression L. Referrals and appointments   In  addition, I have reviewed and discussed with patient certain preventive protocols, quality metrics, and best practice recommendations. A written personalized care plan for preventive services as well as general preventive health recommendations were provided to patient.   Signed,  Clemetine Marker, LPN Nurse Health Advisor   Nurse Notes: pt c/o neuropathy in hands, specifically right hand with increased pain at night. Pt scheduled to see ortho later today.

## 2020-05-03 NOTE — Patient Instructions (Signed)
Molly [name] , Thank you for taking time to come for your Medicare Wellness Visit. I appreciate your ongoing commitment to your health goals. Please review the following plan we discussed and let me know if I can assist you in the future.   Screening recommendations/referrals: Colonoscopy: done 05/05/18. Repeat in 2022 Mammogram: done 04/23/20 Bone Density: done 02/02/17. Please call [phone] to schedule your bone density screening.  Recommended yearly ophthalmology/optometry visit for glaucoma screening and checkup Recommended yearly dental visit for hygiene and checkup  Vaccinations: Influenza vaccine: done 10/05/19 Pneumococcal vaccine: done 04/06/18 Tdap vaccine: done 11/11/16 Shingles vaccine: Shingrix discussed. Please contact your pharmacy for coverage information.  Covid-19: due  Advanced directives: Please bring a copy of your health care power of attorney and living will to the office at your convenience.  Conditions/risks identified: Recommend drinking 6-8 glasses of water per day  Next appointment: Please follow up in one year for your Medicare Annual Wellness visit.     Preventive Care 36 Years and Older, Female Preventive care refers to lifestyle choices and visits with your health care provider that can promote health and wellness. What does preventive care include?  A yearly physical exam. This is also called an annual well check.  Dental exams once or twice a year.  Routine eye exams. Ask your health care provider how often you should have your eyes checked.  Personal lifestyle choices, including:  Daily care of your teeth and gums.  Regular physical activity.  Eating a healthy diet.  Avoiding tobacco and drug use.  Limiting alcohol use.  Practicing safe sex.  Taking low-dose aspirin every day.  Taking vitamin and mineral supplements as recommended by your health care provider. What happens during an annual well check? The services and screenings done  by your health care provider during your annual well check will depend on your age, overall health, lifestyle risk factors, and family history of disease. Counseling  Your health care provider may ask you questions about your:  Alcohol use.  Tobacco use.  Drug use.  Emotional well-being.  Home and relationship well-being.  Sexual activity.  Eating habits.  History of falls.  Memory and ability to understand (cognition).  Work and work Statistician.  Reproductive health. Screening  You may have the following tests or measurements:  Height, weight, and BMI.  Blood pressure.  Lipid and cholesterol levels. These may be checked every 5 years, or more frequently if you are over 39 years old.  Skin check.  Lung cancer screening. You may have this screening every year starting at age 49 if you have a 30-pack-year history of smoking and currently smoke or have quit within the past 15 years.  Fecal occult blood test (FOBT) of the stool. You may have this test every year starting at age 68.  Flexible sigmoidoscopy or colonoscopy. You may have a sigmoidoscopy every 5 years or a colonoscopy every 10 years starting at age 74.  Hepatitis C blood test.  Hepatitis B blood test.  Sexually transmitted disease (STD) testing.  Diabetes screening. This is done by checking your blood sugar (glucose) after you have not eaten for a while (fasting). You may have this done every 1-3 years.  Bone density scan. This is done to screen for osteoporosis. You may have this done starting at age 49.  Mammogram. This may be done every 1-2 years. Talk to your health care provider about how often you should have regular mammograms. Talk with your health care provider about your  test results, treatment options, and if necessary, the need for more tests. Vaccines  Your health care provider may recommend certain vaccines, such as:  Influenza vaccine. This is recommended every year.  Tetanus,  diphtheria, and acellular pertussis (Tdap, Td) vaccine. You may need a Td booster every 10 years.  Zoster vaccine. You may need this after age 63.  Pneumococcal 13-valent conjugate (PCV13) vaccine. One dose is recommended after age 66.  Pneumococcal polysaccharide (PPSV23) vaccine. One dose is recommended after age 18. Talk to your health care provider about which screenings and vaccines you need and how often you need them. This information is not intended to replace advice given to you by your health care provider. Make sure you discuss any questions you have with your health care provider. Document Released: 01/11/2016 Document Revised: 09/03/2016 Document Reviewed: 10/16/2015 Elsevier Interactive Patient Education  2017 [place] Prevention in the Home Falls can cause injuries. They can happen to people of all ages. There are many things you can do to make your home safe and to help prevent falls. What can I do on the outside of my home?  Regularly fix the edges of walkways and driveways and fix any cracks.  Remove anything that might make you trip as you walk through a door, such as a raised step or threshold.  Trim any bushes or trees on the path to your home.  Use bright outdoor lighting.  Clear any walking paths of anything that might make someone trip, such as rocks or tools.  Regularly check to see if handrails are loose or broken. Make sure that both sides of any steps have handrails.  Any raised decks and porches should have guardrails on the edges.  Have any leaves, snow, or ice cleared regularly.  Use sand or salt on walking paths during winter.  Clean up any spills in your garage right away. This includes oil or grease spills. What can I do in the bathroom?  Use night lights.  Install grab bars by the toilet and in the tub and shower. Do not use towel bars as grab bars.  Use non-skid mats or decals in the tub or shower.  If you need to sit down in  the shower, use a plastic, non-slip stool.  Keep the floor dry. Clean up any water that spills on the floor as soon as it happens.  Remove soap buildup in the tub or shower regularly.  Attach bath mats securely with double-sided non-slip rug tape.  Do not have throw rugs and other things on the floor that can make you trip. What can I do in the bedroom?  Use night lights.  Make sure that you have a light by your bed that is easy to reach.  Do not use any sheets or blankets that are too big for your bed. They should not hang down onto the floor.  Have a firm chair that has side arms. You can use this for support while you get dressed.  Do not have throw rugs and other things on the floor that can make you trip. What can I do in the kitchen?  Clean up any spills right away.  Avoid walking on wet floors.  Keep items that you use a lot in easy-to-reach places.  If you need to reach something above you, use a strong step stool that has a grab bar.  Keep electrical cords out of the way.  Do not use floor polish or wax that  makes floors slippery. If you must use wax, use non-skid floor wax.  Do not have throw rugs and other things on the floor that can make you trip. What can I do with my stairs?  Do not leave any items on the stairs.  Make sure that there are handrails on both sides of the stairs and use them. Fix handrails that are broken or loose. Make sure that handrails are as long as the stairways.  Check any carpeting to make sure that it is firmly attached to the stairs. Fix any carpet that is loose or worn.  Avoid having throw rugs at the top or bottom of the stairs. If you do have throw rugs, attach them to the floor with carpet tape.  Make sure that you have a light switch at the top of the stairs and the bottom of the stairs. If you do not have them, ask someone to add them for you. What else can I do to help prevent falls?  Wear shoes that:  Do not have high  heels.  Have rubber bottoms.  Are comfortable and fit you well.  Are closed at the toe. Do not wear sandals.  If you use a stepladder:  Make sure that it is fully opened. Do not climb a closed stepladder.  Make sure that both sides of the stepladder are locked into place.  Ask someone to hold it for you, if possible.  Clearly mark and make sure that you can see:  Any grab bars or handrails.  First and last steps.  Where the edge of each step is.  Use tools that help you move around (mobility aids) if they are needed. These include:  Canes.  Walkers.  Scooters.  Crutches.  Turn on the lights when you go into a dark area. Replace any light bulbs as soon as they burn out.  Set up your furniture so you have a clear path. Avoid moving your furniture around.  If any of your floors are uneven, fix them.  If there are any pets around you, be aware of where they are.  Review your medicines with your doctor. Some medicines can make you feel dizzy. This can increase your chance of falling. Ask your doctor what other things that you can do to help prevent falls. This information is not intended to replace advice given to you by your health care provider. Make sure you discuss any questions you have with your health care provider. Document Released: 10/11/2009 Document Revised: 05/22/2016 Document Reviewed: 01/19/2015 Elsevier Interactive Patient Education  2017 [employer].

## 2020-05-04 NOTE — Therapy (Addendum)
[facility] [address] [place], [place], [address] Phone: [phone]   Fax:  [phone]  Physical Therapy Evaluation  Patient Details  Name: Molly [name] MRN: [id number] Date of Birth: 1951-09-18 Referring Provider (PT): Dr. Steele Sizer   Encounter Date: 05/03/2020   PT End of Session - 05/04/20 1835    Visit Number  1    Number of Visits  9    Date for PT Re-Evaluation  05/31/20    Authorization - Visit Number  1    Authorization - Number of Visits  10    PT Start Time  1426    PT Stop Time  1520    PT Time Calculation (min)  54 min    Activity Tolerance  Patient tolerated treatment well;Patient limited by pain         Past Medical History:  Diagnosis Date  . Anxiety   . Asthma    history of asthma  . Cardiomyopathy ([place])   . Complication of anesthesia    tore hair out and made teeth rough  . COVID-19 virus infection 09/08/2019  . Depression   . Endometrial cancer ([place]) 08/2018  . GERD (gastroesophageal reflux disease)    takes prilosec prn  . History of CVA (cerebrovascular accident) 12/10/2015  . Hyperlipidemia LDL goal <70 12/10/2015  . Hypertension   . Prediabetes 10/02/2017   A1c 6 in January 2018  . Stroke [facility]) 1990    no residual effects    Past Surgical History:  Procedure Laterality Date  . ABDOMINAL HYSTERECTOMY    . CHOLECYSTECTOMY    . COLONOSCOPY WITH PROPOFOL N/A 01/08/2017   Procedure: COLONOSCOPY WITH PROPOFOL;  Surgeon: [name], MD;  Location: [facility] ENDOSCOPY;  Service: Endoscopy;  Laterality: N/A;  . COLONOSCOPY WITH PROPOFOL N/A 05/05/2018   Procedure: COLONOSCOPY WITH PROPOFOL;  Surgeon: [name], MD;  Location: [facility] ENDOSCOPY;  Service: Gastroenterology;  Laterality: N/A;  . HERNIA REPAIR  [id number]   umbilical  . PORTACATH PLACEMENT N/A 11/04/2018   Procedure: INSERTION PORT-A-CATH;  Surgeon: Jules Husbands, MD;  Location: [facility] ORS;  Service: General;  Laterality: N/A;  .  SENTINEL NODE BIOPSY N/A 10/06/2018   Procedure: SENTINEL NODE BIOPSY;  Surgeon: Mellody Drown, MD;  Location: [facility] ORS;  Service: Gynecology;  Laterality: N/A;  . UMBILICAL HERNIA REPAIR N/A 11/17/2017   Procedure: HERNIA REPAIR UMBILICAL ADULT;  Surgeon: Jules Husbands, MD;  Location: [facility] ORS;  Service: General;  Laterality: N/A;    There were no vitals filed for this visit.   Subjective Assessment - 06/02/20 1903    Subjective  Pt. states she has beening having increase B knee pain over past several months.  Pt. fell 2 months ago and has h/o several falls.    Pertinent History  Pt. has 14 siblings and lives in [place].  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  Pt. is not active and does no sleep well.  Pt. not driving and relies on family.    Limitations  Standing;Walking    Patient Stated Goals  Decrease knee pain/ improve walking    Currently in Pain?  Yes    Pain Score  5     Pain Location  Knee    Pain Orientation  Right;Left    Pain Descriptors / Indicators  Aching    Multiple Pain Sites  Yes    Pain Score  3    Pain Location  Hand    Pain Orientation  Left;Right         [facility] PT Assessment - 06/02/20 0001      Assessment   Medical Diagnosis  Chemotherapy-induced neuropathy/ Primary Osteoarthritis of B knees    Referring Provider (PT)  Dr. Steele Sizer    Onset Date/Surgical Date  12/30/19    Next MD Visit  August      Precautions   Precautions  None      Balance Screen   Has the patient fallen in the past 6 months  Yes    How many times?  Several      Prior Function   Level of Independence  Independent      Cognition   Overall Cognitive Status  Within Functional Limits for tasks assessed       See flowsheet   Objective measurements completed on examination: See above findings.     See HEP   PT Education - 06/02/20 1910    Education Details  See HEP/ discussed compression knee sleeve     Person(s) Educated  Patient    Methods  Explanation;Demonstration;Handout    Comprehension  Verbalized understanding;Returned demonstration          PT Long Term Goals - 06/02/20 1917      PT LONG TERM GOAL #1   Title  Pt. will increase FOTO to 60 to improve pain-free functional mobility.    Baseline  Initial FOTO: 43    Time  4    Period  Weeks    Status  New    Target Date  05/31/20      PT LONG TERM GOAL #2   Title  Pt. able to stand from standard chair with proper technique/ no UE safely.    Baseline  Increase B knee pain/ UE assist/ extra time.  Antalgic gait with initial steps.    Time  4    Period  Weeks    Status  New    Target Date  05/31/20      PT LONG TERM GOAL #3   Title  Pt. will present with good quad control/extension with no c/o knee pain to improve funcitonal mobility.    Baseline  Pain limited full knee extension/ MMT.    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    Baseline  Pain limited B knee (10/10 at worst) with turning/ prolonged activity.    Time  4    Period  Weeks    Status  New    Target Date  05/31/20             Plan - 06/02/20 1911    Clinical Impression Statement  Pt. is a pleasant 69 y/o female with chronic c/o B knee pain and h/o falls.  Pt. reports 10/10 B knee pain last night and 5/10 knee pain currently.  Pt. c/o 3/10 B hands/ feet pain.  Pt. reports increase pain in knees with squatting/ making turns/ returning to stand (occasionally needs assist).  FOTO: initial 43/ goal 60.  B knee AROM WFL and strength grossly 5/5 MMT with increase pain during MMT knee extension (bilaterally).  B pitting edema  in lower legs.  Increase L hip tenderness with SLR/ full knee extension in supine.  Moderate L antalgic gait secondary to "catching"/ limp while walking around PT  clinic.  Pt. will benefit from skilled PT services to develop ex. program to improve pain-free functional mobility.    Stability/Clinical Decision Making  Evolving/Moderate complexity    Clinical Decision Making  Moderate    Rehab Potential  Fair    PT Frequency  2x / week    PT Duration  4 weeks    PT Treatment/Interventions  ADLs/Self Care Home Management;Aquatic Therapy;Cryotherapy;Electrical Stimulation;Moist Heat;Functional mobility Arts administrator;Therapeutic activities;Therapeutic exercise;Balance training;Neuromuscular re-education;Patient/family education;Manual techniques;Passive range of motion    PT Next Visit Plan  Progress quad/ hip strengthening.    PT Home Exercise Plan  [facility]       Patient will benefit from skilled therapeutic intervention in order to improve the following deficits and impairments:  Abnormal gait, Decreased balance, Decreased endurance, Decreased mobility, Hypomobility, Difficulty walking, Obesity, Decreased range of motion, Decreased activity tolerance, Decreased strength, Impaired flexibility, Postural dysfunction, Pain  Visit Diagnosis: Chronic pain of left knee  Chronic pain of right knee  Muscle weakness (generalized)  Gait difficulty     Problem List Patient Active Problem List   Diagnosis Date Noted  . 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 Immunosuppressed status ([place]) 01/11/2019  . Iron deficiency anemia 11/15/2018  . Endometrial adenocarcinoma ([place]) 10/29/2018  . Umbilical hernia without obstruction and without gangrene   . Atherosclerosis of right coronary artery 11/10/2017  . Atherosclerosis of abdominal aorta ([place]) 11/10/2017  . Prediabetes 10/02/2017  . Pain in limb 03/31/2017  . Perennial allergic rhinitis with seasonal variation 05/14/2016  . Asthma, mild intermittent, well-controlled 05/14/2016  . Hypertension goal BP (blood pressure) <  140/90 12/10/2015  . Cardiomyopathy due to hypertension ([place]) 12/10/2015  . History of CVA (cerebrovascular accident) 12/10/2015  . Hyperlipidemia LDL goal <70 12/10/2015  . Statin intolerance 12/10/2015   Pura Spice, PT, DPT # [id number] 06/02/2020, 7:21 PM  [facility] [address] [place], [place], [address] Phone: [phone]   Fax:  [phone]  Name: Molly [name] MRN: [id number] Date of Birth: 1951/02/25

## 2020-05-10 ENCOUNTER — Ambulatory Visit: Payer: Medicare Other | Admitting: Physical Therapy

## 2020-05-10 ENCOUNTER — Other Ambulatory Visit: Payer: Self-pay

## 2020-05-10 DIAGNOSIS — R269 Unspecified abnormalities of gait and mobility: Secondary | ICD-10-CM

## 2020-05-10 DIAGNOSIS — M6281 Muscle weakness (generalized): Secondary | ICD-10-CM | POA: Diagnosis not present

## 2020-05-10 DIAGNOSIS — M25561 Pain in right knee: Secondary | ICD-10-CM | POA: Diagnosis not present

## 2020-05-10 DIAGNOSIS — G8929 Other chronic pain: Secondary | ICD-10-CM | POA: Diagnosis not present

## 2020-05-10 DIAGNOSIS — M25562 Pain in left knee: Secondary | ICD-10-CM | POA: Diagnosis not present

## 2020-05-11 ENCOUNTER — Ambulatory Visit (INDEPENDENT_AMBULATORY_CARE_PROVIDER_SITE_OTHER)
Admission: RE | Admit: 2020-05-11 | Discharge: 2020-05-11 | Disposition: A | Discharge status: Home / Self Care | Payer: Medicare Other | Source: Ambulatory Visit

## 2020-05-11 ENCOUNTER — Telehealth: Payer: Self-pay | Admitting: Family Medicine

## 2020-05-11 DIAGNOSIS — J4521 Mild intermittent asthma with (acute) exacerbation: Secondary | ICD-10-CM | POA: Diagnosis not present

## 2020-05-11 DIAGNOSIS — J4520 Mild intermittent asthma, uncomplicated: Secondary | ICD-10-CM

## 2020-05-11 MED ORDER — ALBUTEROL SULFATE HFA 108 (90 BASE) MCG/ACT IN AERS: 2.0000 | INHALATION_SPRAY | RESPIRATORY_TRACT | 0 refills | Status: DC | PRN

## 2020-05-11 MED ORDER — PREDNISONE 10 MG (21) PO TBPK
ORAL_TABLET | Freq: Every day | ORAL | 0 refills | Status: DC
Start: 2020-05-11 — End: 2020-07-13

## 2020-05-11 NOTE — Discharge Instructions (Signed)
Proair inhaler prescribed.  Use as directed Take steroid as prescribed and to completion Follow up with PCP next week Follow up in person or go to ER if you have any new or worsening symptoms such as shortness of breath, difficulty breathing, accessory muscle use, rib retraction, or if symptoms do not improve with medication

## 2020-05-11 NOTE — ED Provider Notes (Signed)
[place]    Virtual Visit via Video Note:  CHIVON BATOR  initiated request for Telemedicine visit with [facility] team. I connected with [name]  on [id number] at 12:32 PM  for a synchronized telemedicine visit using a video enabled HIPPA compliant telemedicine application. I verified that I am speaking with [name]  using two identifiers. Lestine Box, PA-C  was physically located in a [facility] Urgent care site and JONEEN HAAB was located at a different location.   The limitations of evaluation and management by telemedicine as well as the availability of in-person appointments were discussed. Patient was informed that she  may incur a bill ( including co-pay) for this virtual visit encounter. Goldena Cutri Hagy  expressed understanding and gave verbal consent to proceed with virtual visit.  [id number] 05/11/20 Arrival Time: 1224  Cc: COUGH  SUBJECTIVE:  Molly [name] is a 69 y.o. female hx of asthma, who presents with worsening mostly dry cough, congestion, and wheezing 2 weeks.  Denies sick exposure or precipitating event.  Denies concern for COVID.  Has tried wearing a mask, breo and albuterol inhaler with temporary relief.  Symptoms are made worse with pollen.  Reports previous symptoms in the past with prednisone.   Denies fever, chills, fatigue, rhinorrhea, sore throat, SOB, chest pain, nausea, vomiting, changes in bowel or bladder habits.    Reports previous hx of COVID infection in September of 2020.  Did not receive her COVID vaccines.    ROS: As per HPI.  All other pertinent ROS negative.     Past Medical History:  Diagnosis Date  . Anxiety   . Asthma    history of asthma  . Cardiomyopathy ([place])   . Complication of anesthesia    tore hair out and made teeth rough  . COVID-19 virus infection 09/08/2019  . Depression   . Endometrial cancer ([place]) 08/2018  . GERD (gastroesophageal reflux disease)    takes prilosec prn  . History  of CVA (cerebrovascular accident) 12/10/2015  . Hyperlipidemia LDL goal <70 12/10/2015  . Hypertension   . Prediabetes 10/02/2017   A1c 6 in January 2018  . Stroke [facility]) 1990    no residual effects   Past Surgical History:  Procedure Laterality Date  . ABDOMINAL HYSTERECTOMY    . CHOLECYSTECTOMY    . COLONOSCOPY WITH PROPOFOL N/A 01/08/2017   Procedure: COLONOSCOPY WITH PROPOFOL;  Surgeon: [name], MD;  Location: [facility] ENDOSCOPY;  Service: Endoscopy;  Laterality: N/A;  . COLONOSCOPY WITH PROPOFOL N/A 05/05/2018   Procedure: COLONOSCOPY WITH PROPOFOL;  Surgeon: [name], MD;  Location: [facility] ENDOSCOPY;  Service: Gastroenterology;  Laterality: N/A;  . HERNIA REPAIR  [id number]   umbilical  . PORTACATH PLACEMENT N/A 11/04/2018   Procedure: INSERTION PORT-A-CATH;  Surgeon: Jules Husbands, MD;  Location: [facility] ORS;  Service: General;  Laterality: N/A;  . SENTINEL NODE BIOPSY N/A 10/06/2018   Procedure: SENTINEL NODE BIOPSY;  Surgeon: Mellody Drown, MD;  Location: [facility] ORS;  Service: Gynecology;  Laterality: N/A;  . UMBILICAL HERNIA REPAIR N/A 11/17/2017   Procedure: HERNIA REPAIR UMBILICAL ADULT;  Surgeon: Jules Husbands, MD;  Location: [facility] ORS;  Service: General;  Laterality: N/A;   Allergies  Allergen Reactions  . Ferumoxytol Anaphylaxis  . Nsaids Hives and Rash  . Statins Other (See Comments)    Joint pains  . Victoza [Liraglutide] Other (See Comments)    pancreatitis  . Oxycodone Nausea And Vomiting  .  Crestor [Rosuvastatin Calcium] Rash   No current facility-administered medications on file prior to encounter.   Current Outpatient Medications on File Prior to Encounter  Medication Sig Dispense Refill  . acetaminophen (TYLENOL) 500 MG tablet Take 2 tablets (1,000 mg total) by mouth every 6 (six) hours as needed for mild pain or moderate pain. 30 tablet 0  . escitalopram (LEXAPRO) 10 MG tablet Take 1 tablet (10 mg total) by mouth daily. 30 tablet 0  . ezetimibe (ZETIA) 10 MG  tablet Take 1 tablet (10 mg total) by mouth daily. 30 tablet 0  . furosemide (LASIX) 40 MG tablet Take 1 tablet by mouth daily. 30 tablet 0  . ketoconazole (NIZORAL) 2 % cream Apply 1 application topically daily as needed for irritation. On abdominal fold prn 60 g 0  . levocetirizine (XYZAL) 5 MG tablet Take 1 tablet by mouth every evening. 30 tablet 0  . olmesartan-hydrochlorothiazide (BENICAR HCT) 20-12.5 MG tablet Take 1 tablet by mouth daily. 30 tablet 0  . omeprazole (PRILOSEC) 10 MG capsule Take 10 mg by mouth daily.    . pantoprazole (PROTONIX) 20 MG tablet Take 1 tablet by mouth daily. 30 tablet 0  . potassium chloride SA (KLOR-CON) 20 MEQ tablet Take 1 tablet by mouth daily. 30 tablet 0  . pregabalin (LYRICA) 50 MG capsule Take 1 capsule by mouth every morning, and, take 2 capsules every night. 90 capsule 2      OBJECTIVE:  There were no vitals filed for this visit.   General appearance: alert; no distress Eyes: EOMI grossly HENT: normocephalic; atraumatic Neck: supple with FROM Lungs: normal respiratory effort; speaking in full sentences without difficulty Extremities: moves extremities without difficulty Skin: No obvious rashes Neurologic: No facial asymmetries Psychological: alert and cooperative; normal mood and affect   ASSESSMENT & PLAN:  1. Mild intermittent asthma with exacerbation   2. Mild intermittent asthma without complication     Meds ordered this encounter  Medications  . predniSONE (STERAPRED UNI-PAK 21 TAB) 10 MG (21) TBPK tablet    Sig: Take by mouth daily. Take 6 tabs by mouth daily  for 2 days, then 5 tabs for 2 days, then 4 tabs for 2 days, then 3 tabs for 2 days, 2 tabs for 2 days, then 1 tab by mouth daily for 2 days    Dispense:  42 tablet    Refill:  0    Order Specific Question:   Supervising Provider    Answer:   Raylene Everts [id number]  . albuterol (VENTOLIN HFA) 108 (90 Base) MCG/ACT inhaler    Sig: Inhale 2 puffs into the lungs  every 4 (four) hours as needed for wheezing or shortness of breath.    Dispense:  18 g    Refill:  0    Order Specific Question:   Supervising Provider    Answer:   Raylene Everts [id number]    Proair inhaler prescribed.  Use as directed Take steroid as prescribed and to completion Follow up with PCP next week Follow up in person or go to ER if you have any new or worsening symptoms such as shortness of breath, difficulty breathing, accessory muscle use, rib retraction, or if symptoms do not improve with medication   I discussed the assessment and treatment plan with the patient. The patient was provided an opportunity to ask questions and all were answered. The patient agreed with the plan and demonstrated an understanding of the instructions.   The patient was  advised to call back or seek an in-person evaluation if the symptoms worsen or if the condition fails to improve as anticipated.  I provided 7 minutes of non-face-to-face time during this encounter.  Lestine Box, PA-C  05/11/2020 12:32 PM           Lestine Box, PA-C 05/11/20 1233

## 2020-05-11 NOTE — Telephone Encounter (Signed)
Pt has a cough and she is asking if prednisone can be called into [facility] [address]. Please advise

## 2020-05-14 ENCOUNTER — Ambulatory Visit: Payer: Medicare Other | Admitting: Physical Therapy

## 2020-05-14 ENCOUNTER — Other Ambulatory Visit: Payer: Self-pay

## 2020-05-14 DIAGNOSIS — M6281 Muscle weakness (generalized): Secondary | ICD-10-CM

## 2020-05-14 DIAGNOSIS — R269 Unspecified abnormalities of gait and mobility: Secondary | ICD-10-CM

## 2020-05-14 DIAGNOSIS — G8929 Other chronic pain: Secondary | ICD-10-CM

## 2020-05-14 DIAGNOSIS — M25561 Pain in right knee: Secondary | ICD-10-CM | POA: Diagnosis not present

## 2020-05-14 DIAGNOSIS — M25562 Pain in left knee: Secondary | ICD-10-CM

## 2020-05-16 ENCOUNTER — Telehealth: Payer: Self-pay

## 2020-05-16 ENCOUNTER — Ambulatory Visit: Payer: Medicare Other

## 2020-05-16 NOTE — Telephone Encounter (Signed)
Copied from [place] [phone]. Topic: Referral - Status >> May 16, 2020 [id number] AM Simone Curia D wrote: [id number] Spoke with patient about assistance with utilities through [facility] and [employer] [place].  Will follow-up with patient Friday, May21. Ambrose Mantle [phone]

## 2020-05-17 ENCOUNTER — Other Ambulatory Visit: Payer: Self-pay

## 2020-05-17 ENCOUNTER — Ambulatory Visit: Payer: Medicare Other | Admitting: Physical Therapy

## 2020-05-17 DIAGNOSIS — M6281 Muscle weakness (generalized): Secondary | ICD-10-CM | POA: Diagnosis not present

## 2020-05-17 DIAGNOSIS — G8929 Other chronic pain: Secondary | ICD-10-CM

## 2020-05-17 DIAGNOSIS — M25562 Pain in left knee: Secondary | ICD-10-CM | POA: Diagnosis not present

## 2020-05-17 DIAGNOSIS — R269 Unspecified abnormalities of gait and mobility: Secondary | ICD-10-CM | POA: Diagnosis not present

## 2020-05-17 DIAGNOSIS — M25561 Pain in right knee: Secondary | ICD-10-CM | POA: Diagnosis not present

## 2020-05-17 NOTE — Therapy (Addendum)
[facility] [address]. [place], [place], [address] Phone: [phone]   Fax:  [phone]  Physical Therapy Treatment  Patient Details  Name: Molly [name] MRN: [id number] Date of Birth: 1951/07/29 Referring Provider (PT): Dr. Steele Sizer   Encounter Date: 05/14/2020   PT End of Session - 05/17/20 1410    Visit Number  3    Number of Visits  9    Date for PT Re-Evaluation  05/31/20    Authorization - Visit Number  3    Authorization - Number of Visits  10    PT Start Time  [id number]    PT Stop Time  1517    PT Time Calculation (min)  45 min    Activity Tolerance  Patient tolerated treatment well;Patient limited by pain        Past Medical History:  Diagnosis Date  . Anxiety   . Asthma    history of asthma  . Cardiomyopathy ([place])   . Complication of anesthesia    tore hair out and made teeth rough  . COVID-19 virus infection 09/08/2019  . Depression   . Endometrial cancer ([place]) 08/2018  . GERD (gastroesophageal reflux disease)    takes prilosec prn  . History of CVA (cerebrovascular accident) 12/10/2015  . Hyperlipidemia LDL goal <70 12/10/2015  . Hypertension   . Prediabetes 10/02/2017   A1c 6 in January 2018  . Stroke [facility]) 1990    no residual effects    Past Surgical History:  Procedure Laterality Date  . ABDOMINAL HYSTERECTOMY    . CHOLECYSTECTOMY    . COLONOSCOPY WITH PROPOFOL N/A 01/08/2017   Procedure: COLONOSCOPY WITH PROPOFOL;  Surgeon: [name], MD;  Location: [facility] ENDOSCOPY;  Service: Endoscopy;  Laterality: N/A;  . COLONOSCOPY WITH PROPOFOL N/A 05/05/2018   Procedure: COLONOSCOPY WITH PROPOFOL;  Surgeon: [name], MD;  Location: [facility] ENDOSCOPY;  Service: Gastroenterology;  Laterality: N/A;  . HERNIA REPAIR  [id number]   umbilical  . PORTACATH PLACEMENT N/A 11/04/2018   Procedure: INSERTION PORT-A-CATH;  Surgeon: Jules Husbands, MD;  Location: [facility] ORS;  Service: General;  Laterality: N/A;  .  SENTINEL NODE BIOPSY N/A 10/06/2018   Procedure: SENTINEL NODE BIOPSY;  Surgeon: Mellody Drown, MD;  Location: [facility] ORS;  Service: Gynecology;  Laterality: N/A;  . UMBILICAL HERNIA REPAIR N/A 11/17/2017   Procedure: HERNIA REPAIR UMBILICAL ADULT;  Surgeon: Jules Husbands, MD;  Location: [facility] ORS;  Service: General;  Laterality: N/A;    There were no vitals filed for this visit.  Subjective Assessment - 06/03/20 1703    Subjective  Pt. states her MD put her on Prednisone and she "feels much better" overall.  PT discussed use of knee sleeve.    Pertinent History  Pt. has 14 siblings and lives in [place].  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  Pt. is not active and does no sleep well.  Pt. not driving and relies on family.    Limitations  Standing;Walking    Patient Stated Goals  Decrease knee pain/ improve walking    Currently in Pain?  No/denies        No manual tx. today    There.ex.:  Nustep L4 10 min. B UE/LE (no knee pain) Supine/ seated LE stretches Standing hip ex. At //-bars (no resistance)- all 4-planes 20x each. Step touches at stairs/ hamstring stretches     PT  Long Term Goals - 06/02/20 1917      PT LONG TERM GOAL #1   Title  Pt. will increase FOTO to 60 to improve pain-free functional mobility.    Baseline  Initial FOTO: 43    Time  4    Period  Weeks    Status  New    Target Date  05/31/20      PT LONG TERM GOAL #2   Title  Pt. able to stand from standard chair with proper technique/ no UE safely.    Baseline  Increase B knee pain/ UE assist/ extra time.  Antalgic gait with initial steps.    Time  4    Period  Weeks    Status  New    Target Date  05/31/20      PT LONG TERM GOAL #3   Title  Pt. will present with good quad control/extension with no c/o knee pain to improve funcitonal mobility.    Baseline  Pain limited full knee extension/ MMT.    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    Baseline  Pain limited B knee (10/10 at worst) with turning/ prolonged activity.    Time  4    Period  Weeks    Status  New    Target Date  05/31/20            Plan - 06/03/20 1704    Clinical Impression Statement  No c/o knee catching or pain with sit to stands/ walking.  Decrease pain is probably a result of recent Prednisone taper.  Pt. moving better overall with increase cadence/ confidence compared to last tx. session.  No change to HEP.  PT discussed use of knee sleeve to decrease knee pain with turning/ pivoting at work.    Stability/Clinical Decision Making  Evolving/Moderate complexity    Clinical Decision Making  Moderate    Rehab Potential  Fair    PT Frequency  2x / week    PT Duration  4 weeks    PT Treatment/Interventions  ADLs/Self Care Home Management;Aquatic Therapy;Cryotherapy;Electrical Stimulation;Moist Heat;Functional mobility Arts administrator;Therapeutic activities;Therapeutic exercise;Balance training;Neuromuscular re-education;Patient/family education;Manual techniques;Passive range of motion    PT Next Visit Plan  Progress quad/ hip strengthening.    PT Home Exercise Plan  [facility]       Patient will benefit from skilled therapeutic intervention in order to improve the following deficits and impairments:  Abnormal gait, Decreased balance, Decreased endurance, Decreased mobility, Hypomobility, Difficulty walking, Obesity, Decreased range of motion, Decreased activity tolerance, Decreased strength, Impaired flexibility, Postural dysfunction, Pain  Visit Diagnosis: Chronic pain of left knee  Chronic pain of right knee  Muscle weakness (generalized)  Gait difficulty     Problem List Patient Active Problem List   Diagnosis Date Noted  . 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 Immunosuppressed status ([place]) 01/11/2019  . Iron deficiency anemia 11/15/2018  . Endometrial adenocarcinoma ([place]) 10/29/2018  . Umbilical hernia without obstruction and without gangrene   . Atherosclerosis of right coronary artery 11/10/2017  . Atherosclerosis of abdominal aorta ([place]) 11/10/2017  . Prediabetes 10/02/2017  . Pain in limb 03/31/2017  .  Perennial allergic rhinitis with seasonal variation 05/14/2016  . Asthma, mild intermittent, well-controlled 05/14/2016  . Hypertension goal BP (blood pressure) < 140/90 12/10/2015  . Cardiomyopathy due to hypertension ([place]) 12/10/2015  . History of CVA (cerebrovascular accident) 12/10/2015  . Hyperlipidemia LDL goal <70 12/10/2015  . Statin intolerance 12/10/2015   Pura Spice, PT, DPT # [phone] 06/03/2020, 5:09 PM  [place] [address] [place], [place], [address] Phone: [phone]   Fax:  [phone]  Name: TIZIANA GAROFANO MRN: [id number] Date of Birth: 10/08/1951

## 2020-05-17 NOTE — Therapy (Addendum)
[facility] [address] [place], [place], [address] Phone: [phone]   Fax:  [phone]  Physical Therapy Treatment  Patient Details  Name: Molly [name] MRN: [id number] Date of Birth: February 25, 1951 Referring Provider (PT): Dr. Steele Sizer   Encounter Date: 05/17/2020   PT End of Session - 05/17/20 1925    Visit Number  4    Number of Visits  9    Date for PT Re-Evaluation  05/31/20    Authorization - Visit Number  4    Authorization - Number of Visits  10    PT Start Time  [id number]    PT Stop Time  1505    PT Time Calculation (min)  41 min    Activity Tolerance  Patient tolerated treatment well;Patient limited by pain         Past Medical History:  Diagnosis Date  . Anxiety   . Asthma    history of asthma  . Cardiomyopathy ([place])   . Complication of anesthesia    tore hair out and made teeth rough  . COVID-19 virus infection 09/08/2019  . Depression   . Endometrial cancer ([place]) 08/2018  . GERD (gastroesophageal reflux disease)    takes prilosec prn  . History of CVA (cerebrovascular accident) 12/10/2015  . Hyperlipidemia LDL goal <70 12/10/2015  . Hypertension   . Prediabetes 10/02/2017   A1c 6 in January 2018  . Stroke [facility]) 1990    no residual effects    Past Surgical History:  Procedure Laterality Date  . ABDOMINAL HYSTERECTOMY    . CHOLECYSTECTOMY    . COLONOSCOPY WITH PROPOFOL N/A 01/08/2017   Procedure: COLONOSCOPY WITH PROPOFOL;  Surgeon: [name], MD;  Location: [facility] ENDOSCOPY;  Service: Endoscopy;  Laterality: N/A;  . COLONOSCOPY WITH PROPOFOL N/A 05/05/2018   Procedure: COLONOSCOPY WITH PROPOFOL;  Surgeon: [name], MD;  Location: [facility] ENDOSCOPY;  Service: Gastroenterology;  Laterality: N/A;  . HERNIA REPAIR  [id number]   umbilical  . PORTACATH PLACEMENT N/A 11/04/2018   Procedure: INSERTION PORT-A-CATH;  Surgeon: Jules Husbands, MD;  Location: [facility] ORS;  Service: General;  Laterality: N/A;  .  SENTINEL NODE BIOPSY N/A 10/06/2018   Procedure: SENTINEL NODE BIOPSY;  Surgeon: Mellody Drown, MD;  Location: [facility] ORS;  Service: Gynecology;  Laterality: N/A;  . UMBILICAL HERNIA REPAIR N/A 11/17/2017   Procedure: HERNIA REPAIR UMBILICAL ADULT;  Surgeon: Jules Husbands, MD;  Location: [facility] ORS;  Service: General;  Laterality: N/A;    There were no vitals filed for this visit.   Subjective Assessment - 06/10/20 1410    Subjective Pt. is still taking Prednisone with helps decrease/ manage knee pain.  Pt. states knee is aching but no c/o pain on subjective scale.    Pertinent History Pt. has 14 siblings and lives in [place].  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  Pt. is not active and does no sleep well.  Pt. not driving and relies on family.    Limitations Standing;Walking    Patient Stated Goals Decrease knee pain/ improve walking    Currently in Pain? No/denies             There.ex.:  Nustep L4 10 min. B UE/LE (no knee pain) Supine/ seated LE stretches (generalized hip/knee musculature) Standing hip ex. At //-bars (no resistance)- all 4-planes 20x each. Step touches at stairs/ hamstring stretches.   Walking in  PT clinic with mirror feedback (forward/ lateral/ backwards)        PT Long Term Goals - 06/02/20 1917      PT LONG TERM GOAL #1   Title Pt. will increase FOTO to 60 to improve pain-free functional mobility.    Baseline Initial FOTO: 43    Time 4    Period Weeks    Status New    Target Date 05/31/20      PT LONG TERM GOAL #2   Title Pt. able to stand from standard chair with proper technique/ no UE safely.    Baseline Increase B knee pain/ UE assist/ extra time.  Antalgic gait with initial steps.    Time 4    Period Weeks    Status New    Target Date 05/31/20      PT LONG TERM GOAL #3   Title Pt. will present with good quad control/extension with no c/o knee pain to improve funcitonal mobility.     Baseline Pain limited full knee extension/ MMT.    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    Baseline Pain limited B knee (10/10 at worst) with turning/ prolonged activity.    Time 4    Period Weeks    Status New    Target Date 05/31/20                 Plan - 06/10/20 1412    Clinical Impression Statement Minimal c/o knee pain with walking/ step ups tasks during tex. Pt. ambulates with increase step pattern/ heel strike and no c/o "catching" or limbing noted.  Pt. will focus on maintaining LE strengtheing/ proper body mechanics to improve pain-free mobility at home and work.    Stability/Clinical Decision Making Evolving/Moderate complexity    Clinical Decision Making Moderate    Rehab Potential Fair    PT Frequency 2x / week    PT Duration 4 weeks    PT Treatment/Interventions ADLs/Self Care Home Management;Aquatic Therapy;Cryotherapy;Electrical Stimulation;Moist Heat;Functional mobility Arts administrator;Therapeutic activities;Therapeutic exercise;Balance training;Neuromuscular re-education;Patient/family education;Manual techniques;Passive range of motion    PT Next Visit Plan Progress quad/ hip strengthening.    PT Home Exercise Plan [facility]           Patient will benefit from skilled therapeutic intervention in order to improve the following deficits and impairments:  Abnormal gait, Decreased balance, Decreased endurance, Decreased mobility, Hypomobility, Difficulty walking, Obesity, Decreased range of motion, Decreased activity tolerance, Decreased strength, Impaired flexibility, Postural dysfunction, Pain  Visit Diagnosis: Chronic pain of left knee  Chronic pain of right knee  Muscle weakness (generalized)  Gait difficulty     Problem List Patient Active Problem List    Diagnosis Date Noted  . 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 ([place]) 01/11/2019  . Immunosuppressed status ([place]) 01/11/2019  . Iron deficiency anemia 11/15/2018  . Endometrial adenocarcinoma ([place]) 10/29/2018  . Umbilical hernia without obstruction and without gangrene   . Atherosclerosis of right coronary artery 11/10/2017  . Atherosclerosis of abdominal aorta ([place]) 11/10/2017  . Prediabetes 10/02/2017  . Pain in limb 03/31/2017  . Perennial allergic rhinitis with seasonal variation 05/14/2016  . Asthma, mild intermittent, well-controlled 05/14/2016  . Hypertension goal BP (blood  pressure) < 140/90 12/10/2015  . Cardiomyopathy due to hypertension ([place]) 12/10/2015  . History of CVA (cerebrovascular accident) 12/10/2015  . Hyperlipidemia LDL goal <70 12/10/2015  . Statin intolerance 12/10/2015   Pura Spice, PT, DPT # [phone] 06/10/2020, 2:14 PM  [facility] [address] [place], [place], [address] Phone: [phone]   Fax:  [phone]  Name: Molly [name] MRN: [id number] Date of Birth: Mar 19, 1951

## 2020-05-17 NOTE — Therapy (Addendum)
[place] [facility] [address] [place], [place], [address] Phone: [phone]   Fax:  [phone]  Physical Therapy Treatment  Patient Details  Name: Molly [name] MRN: [id number] Date of Birth: 12-30-1950 Referring Provider (PT): Dr. Steele Sizer   Encounter Date: 05/10/2020   PT End of Session - 05/17/20 1353    Visit Number  2    Number of Visits  9    Date for PT Re-Evaluation  05/31/20    Authorization - Visit Number  2    Authorization - Number of Visits  10    PT Start Time  [id number]    PT Stop Time  1440    PT Time Calculation (min)  51 min    Activity Tolerance  Patient tolerated treatment well;Patient limited by pain        Past Medical History:  Diagnosis Date  . Anxiety   . Asthma    history of asthma  . Cardiomyopathy ([place])   . Complication of anesthesia    tore hair out and made teeth rough  . COVID-19 virus infection 09/08/2019  . Depression   . Endometrial cancer ([place]) 08/2018  . GERD (gastroesophageal reflux disease)    takes prilosec prn  . History of CVA (cerebrovascular accident) 12/10/2015  . Hyperlipidemia LDL goal <70 12/10/2015  . Hypertension   . Prediabetes 10/02/2017   A1c 6 in January 2018  . Stroke [facility]) 1990    no residual effects    Past Surgical History:  Procedure Laterality Date  . ABDOMINAL HYSTERECTOMY    . CHOLECYSTECTOMY    . COLONOSCOPY WITH PROPOFOL N/A 01/08/2017   Procedure: COLONOSCOPY WITH PROPOFOL;  Surgeon: [name], MD;  Location: [facility] ENDOSCOPY;  Service: Endoscopy;  Laterality: N/A;  . COLONOSCOPY WITH PROPOFOL N/A 05/05/2018   Procedure: COLONOSCOPY WITH PROPOFOL;  Surgeon: [name], MD;  Location: [facility] ENDOSCOPY;  Service: Gastroenterology;  Laterality: N/A;  . HERNIA REPAIR  [id number]   umbilical  . PORTACATH PLACEMENT N/A 11/04/2018   Procedure: INSERTION PORT-A-CATH;  Surgeon: Jules Husbands, MD;  Location: [facility] ORS;  Service: General;  Laterality: N/A;  .  SENTINEL NODE BIOPSY N/A 10/06/2018   Procedure: SENTINEL NODE BIOPSY;  Surgeon: Mellody Drown, MD;  Location: [facility] ORS;  Service: Gynecology;  Laterality: N/A;  . UMBILICAL HERNIA REPAIR N/A 11/17/2017   Procedure: HERNIA REPAIR UMBILICAL ADULT;  Surgeon: Jules Husbands, MD;  Location: [facility] ORS;  Service: General;  Laterality: N/A;    There were no vitals filed for this visit.  Subjective Assessment - 06/03/20 1456    Subjective  Pt. reports soreness in LE but no pain at this time.  Pt. has been working all day at [employer] (a lot of standing).    Pertinent History  Pt. has 14 siblings and lives in [place].  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  Pt. is not active and does no sleep well.  Pt. not driving and relies on family.    Limitations  Standing;Walking    Patient Stated Goals  Decrease knee pain/ improve walking    Currently in Pain?  No/denies         Manual tx.:  Supine LE stretches (generalized) 9 min.   Supine patellar mobs. (all planes).  There.ex.:  Nustep L3 10 min. B UE/LE Reviewed HEP    PT Long Term Goals - 06/02/20 [id number]  PT LONG TERM GOAL #1   Title  Pt. will increase FOTO to 60 to improve pain-free functional mobility.    Baseline  Initial FOTO: 43    Time  4    Period  Weeks    Status  New    Target Date  05/31/20      PT LONG TERM GOAL #2   Title  Pt. able to stand from standard chair with proper technique/ no UE safely.    Baseline  Increase B knee pain/ UE assist/ extra time.  Antalgic gait with initial steps.    Time  4    Period  Weeks    Status  New    Target Date  05/31/20      PT LONG TERM GOAL #3   Title  Pt. will present with good quad control/extension with no c/o knee pain to improve funcitonal mobility.    Baseline  Pain limited full knee extension/ MMT.    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    Baseline  Pain limited B knee (10/10 at worst) with turning/ prolonged activity.    Time  4    Period  Weeks    Status  New    Target Date  05/31/20            Plan - 06/03/20 1459    Clinical Impression Statement  Generalized LE muscle tenderness and soreness t/o and pt. able to tolerate low level Hypervolt/ STM.  PT reviewed HEP adn instructed pt. to avoid pain provoking movement patterns/ take seated breaks as needed at work.    Stability/Clinical Decision Making  Evolving/Moderate complexity    Clinical Decision Making  Moderate    Rehab Potential  Fair    PT Frequency  2x / week    PT Duration  4 weeks    PT Treatment/Interventions  ADLs/Self Care Home Management;Aquatic Therapy;Cryotherapy;Electrical Stimulation;Moist Heat;Functional mobility Arts administrator;Therapeutic activities;Therapeutic exercise;Balance training;Neuromuscular re-education;Patient/family education;Manual techniques;Passive range of motion    PT Next Visit Plan  Progress quad/ hip strengthening.    PT Home Exercise Plan  [facility]       Patient will benefit from skilled therapeutic intervention in order to improve the following deficits and impairments:  Abnormal gait, Decreased balance, Decreased endurance, Decreased mobility, Hypomobility, Difficulty walking, Obesity, Decreased range of motion, Decreased activity tolerance, Decreased strength, Impaired flexibility, Postural dysfunction, Pain  Visit Diagnosis: Chronic pain of left knee  Chronic pain of right knee  Muscle weakness (generalized)  Gait difficulty     Problem List Patient Active Problem List   Diagnosis Date Noted  . 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 Immunosuppressed status ([place]) 01/11/2019  . Iron deficiency anemia 11/15/2018  . Endometrial  adenocarcinoma ([place]) 10/29/2018  . Umbilical hernia without obstruction and without gangrene   . Atherosclerosis of right coronary artery 11/10/2017  . Atherosclerosis of abdominal aorta ([place]) 11/10/2017  . Prediabetes 10/02/2017  . Pain in limb 03/31/2017  . Perennial allergic rhinitis with seasonal variation 05/14/2016  . Asthma, mild intermittent, well-controlled 05/14/2016  . Hypertension goal BP (blood pressure) < 140/90 12/10/2015  . Cardiomyopathy due to hypertension ([place]) 12/10/2015  .  History of CVA (cerebrovascular accident) 12/10/2015  . Hyperlipidemia LDL goal <70 12/10/2015  . Statin intolerance 12/10/2015   Pura Spice, PT, DPT # [id number] 06/03/2020, 3:02 PM  [facility] [address] [place], [place], [address] Phone: [phone]   Fax:  [phone]  Name: RAYLIE BERTINI MRN: [id number] Date of Birth: 07-13-1951

## 2020-05-21 ENCOUNTER — Encounter: Payer: Self-pay | Admitting: Physical Therapy

## 2020-05-21 ENCOUNTER — Other Ambulatory Visit: Payer: Self-pay

## 2020-05-21 ENCOUNTER — Ambulatory Visit: Payer: Medicare Other | Admitting: Physical Therapy

## 2020-05-21 DIAGNOSIS — R269 Unspecified abnormalities of gait and mobility: Secondary | ICD-10-CM

## 2020-05-21 DIAGNOSIS — G8929 Other chronic pain: Secondary | ICD-10-CM | POA: Diagnosis not present

## 2020-05-21 DIAGNOSIS — M25561 Pain in right knee: Secondary | ICD-10-CM | POA: Diagnosis not present

## 2020-05-21 DIAGNOSIS — M25562 Pain in left knee: Secondary | ICD-10-CM | POA: Diagnosis not present

## 2020-05-21 DIAGNOSIS — M6281 Muscle weakness (generalized): Secondary | ICD-10-CM | POA: Diagnosis not present

## 2020-05-22 ENCOUNTER — Telehealth: Payer: Self-pay

## 2020-05-22 NOTE — Telephone Encounter (Signed)
Copied from [place] [id number]. Topic: Referral - Status >> May 22, 2020  [id number] PM Simone Curia D wrote: [phone] Spoke with patient about HOPE program to help with utilities but she is not eligible.  Funding for the DSS CIP program have run out and applications for the [place] program are no longer being accepted at this time. Applying for assistance through the [facility]. Patient will email a copy of her gas bill to me once I receive it I will send a referral to Shelby Mattocks. Ambrose Mantle [phone]

## 2020-05-23 ENCOUNTER — Telehealth: Payer: Self-pay

## 2020-05-23 NOTE — Telephone Encounter (Signed)
Copied from [place] [phone]. Topic: Referral - Status >> May 23, 2020  [id number] PM Simone Curia D wrote: [phone] Left message on voicemail reminding patient to email me a copy of her gas bill so that I can forward it to Shelby Mattocks at [facility]. Ambrose Mantle [phone]

## 2020-05-24 ENCOUNTER — Ambulatory Visit: Payer: Medicare Other | Admitting: Physical Therapy

## 2020-05-24 ENCOUNTER — Telehealth: Payer: Self-pay

## 2020-05-24 NOTE — Telephone Encounter (Signed)
Copied from [place] [phone]. Topic: Referral - Status >> May 24, 2020  123456 PM Simone Curia D wrote: [phone] Spoke with patient her daughter will take a picture of the gas bill and email it to me next week to submit with [facility] application .  Will follow-up with patient on 6/2. Ambrose Mantle [phone]

## 2020-05-25 NOTE — Therapy (Addendum)
[facility] [address] [place], [place], [address] Phone: [phone]   Fax:  [phone]  Physical Therapy Treatment  Patient Details  Name: Molly [name] MRN: [id number] Date of Birth: [birth year] Referring Provider (PT): Dr. Steele Sizer   Encounter Date: 05/21/2020   PT End of Session - 05/25/20 1038    Visit Number  5    Number of Visits  9    Date for PT Re-Evaluation  05/31/20    Authorization - Visit Number  5    Authorization - Number of Visits  10    PT Start Time  [id number]    PT Stop Time  1503    PT Time Calculation (min)  51 min    Activity Tolerance  Patient tolerated treatment well;Patient limited by pain        Past Medical History:  Diagnosis Date  . Anxiety   . Asthma    history of asthma  . Cardiomyopathy ([place])   . Complication of anesthesia    tore hair out and made teeth rough  . COVID-19 virus infection 09/08/2019  . Depression   . Endometrial cancer ([place]) 08/2018  . GERD (gastroesophageal reflux disease)    takes prilosec prn  . History of CVA (cerebrovascular accident) 12/10/2015  . Hyperlipidemia LDL goal <70 12/10/2015  . Hypertension   . Prediabetes 10/02/2017   A1c 6 in January 2018  . Stroke [facility]) 1990    no residual effects    Past Surgical History:  Procedure Laterality Date  . ABDOMINAL HYSTERECTOMY    . CHOLECYSTECTOMY    . COLONOSCOPY WITH PROPOFOL N/A 01/08/2017   Procedure: COLONOSCOPY WITH PROPOFOL;  Surgeon: [name], MD;  Location: [facility] ENDOSCOPY;  Service: Endoscopy;  Laterality: N/A;  . COLONOSCOPY WITH PROPOFOL N/A 05/05/2018   Procedure: COLONOSCOPY WITH PROPOFOL;  Surgeon: [name], MD;  Location[facility] ENDOSCOPY;  Service: Gastroenterology;  Laterality: N/A;  . HERNIA REPAIR  [id number]   umbilical  . PORTACATH PLACEMENT N/A 11/04/2018   Procedure: INSERTION PORT-A-CATH;  Surgeon: Jules Husbands, MD;  Location: [facility] ORS;  Service: General;  Laterality: N/A;  .  SENTINEL NODE BIOPSY N/A 10/06/2018   Procedure: SENTINEL NODE BIOPSY;  Surgeon: Mellody Drown, MD;  Location: [facility] ORS;  Service: Gynecology;  Laterality: N/A;  . UMBILICAL HERNIA REPAIR N/A 11/17/2017   Procedure: HERNIA REPAIR UMBILICAL ADULT;  Surgeon: Jules Husbands, MD;  Location: [facility] ORS;  Service: General;  Laterality: N/A;    There were no vitals filed for this visit.   Subjective Assessment - 06/10/20 1921    Subjective Pt. reports 2/10 B knee pain while working at [employer] today.    Pertinent History Pt. has 14 siblings and lives in [place].  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  Pt. is not active and does no sleep well.  Pt. not driving and relies on family.    Limitations Standing;Walking    Patient Stated Goals Decrease knee pain/ improve walking    Currently in Pain? Yes    Pain Score 2     Pain Location Knee    Pain Orientation Right;Left            There.ex.:  Nustep L410 min. B UE/LE (no knee pain) Supine/ seated LE stretches (generalized hip/knee musculature) Supine ex.: bolster bridging/ SAQ/ marching. Step touches at stairs 20x.  3" step ups/ downs with eccentric  quad control.  Neuro:  Walking in PT clinic with mirror feedback (forward/ lateral/ backwards)   Reassessed tandem stance/ gait     PT Long Term Goals - 06/02/20 1917      PT LONG TERM GOAL #1   Title Pt. will increase FOTO to 60 to improve pain-free functional mobility.    Baseline Initial FOTO: 43    Time 4    Period Weeks    Status New    Target Date 05/31/20      PT LONG TERM GOAL #2   Title Pt. able to stand from standard chair with proper technique/ no UE safely.    Baseline Increase B knee pain/ UE assist/ extra time.  Antalgic gait with initial steps.    Time 4    Period Weeks    Status New    Target Date 05/31/20      PT LONG TERM GOAL #3   Title Pt. will present with good quad control/extension with no c/o  knee pain to improve funcitonal mobility.    Baseline Pain limited full knee extension/ MMT.    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    Baseline Pain limited B knee (10/10 at worst) with turning/ prolonged activity.    Time 4    Period Weeks    Status New    Target Date 05/31/20             Plan - 06/10/20 1922    Clinical Impression Statement Pt. continues to ambulate with slight improvement in gait pattern without knee "catching"/ worsening pain.  Pt. continues to have increase knee pain since working/standing a lot today.  PT reviewed HEP and pt. scheduled for 1 more PT tx. to review goals/ discuss POC.    Stability/Clinical Decision Making Evolving/Moderate complexity    Clinical Decision Making Moderate    Rehab Potential Fair    PT Frequency 2x / week    PT Duration 4 weeks    PT Treatment/Interventions ADLs/Self Care Home Management;Aquatic Therapy;Cryotherapy;Electrical Stimulation;Moist Heat;Functional mobility Arts administrator;Therapeutic activities;Therapeutic exercise;Balance training;Neuromuscular re-education;Patient/family education;Manual techniques;Passive range of motion    PT Next Visit Plan Progress quad/ hip strengthening.  Reassess goals/ check schedule    PT Home Exercise Plan [facility]           Patient will benefit from skilled therapeutic intervention in order to improve the following deficits and impairments:  Abnormal gait, Decreased balance, Decreased endurance, Decreased mobility, Hypomobility, Difficulty walking, Obesity, Decreased range of motion, Decreased activity tolerance, Decreased strength, Impaired flexibility, Postural dysfunction, Pain  Visit Diagnosis: Chronic pain of left knee  Chronic pain of right knee  Muscle weakness (generalized)  Gait  difficulty     Problem List Patient Active Problem List   Diagnosis Date Noted  . 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 Immunosuppressed status ([place]) 01/11/2019  . Iron deficiency anemia 11/15/2018  . Endometrial adenocarcinoma ([place]) 10/29/2018  . Umbilical hernia without obstruction and without gangrene   . Atherosclerosis of right coronary artery 11/10/2017  . Atherosclerosis of abdominal aorta ([place]) 11/10/2017  . Prediabetes 10/02/2017  . Pain in limb 03/31/2017  . Perennial allergic rhinitis with seasonal variation 05/14/2016  .  Asthma, mild intermittent, well-controlled 05/14/2016  . Hypertension goal BP (blood pressure) < 140/90 12/10/2015  . Cardiomyopathy due to hypertension ([place]) 12/10/2015  . History of CVA (cerebrovascular accident) 12/10/2015  . Hyperlipidemia LDL goal <70 12/10/2015  . Statin intolerance 12/10/2015   Pura Spice, PT, DPT # [id number] 06/10/2020, 7:44 PM  [facility] [address] [place], [place], [address] Phone: [phone]   Fax:  [phone]  Name: Molly [name] MRN: [id number] Date of Birth: 1951/12/03

## 2020-05-29 ENCOUNTER — Other Ambulatory Visit: Payer: Self-pay | Admitting: Family Medicine

## 2020-05-29 DIAGNOSIS — R600 Localized edema: Secondary | ICD-10-CM

## 2020-05-29 DIAGNOSIS — J3089 Other allergic rhinitis: Secondary | ICD-10-CM

## 2020-05-29 DIAGNOSIS — I119 Hypertensive heart disease without heart failure: Secondary | ICD-10-CM

## 2020-05-29 DIAGNOSIS — K219 Gastro-esophageal reflux disease without esophagitis: Secondary | ICD-10-CM

## 2020-05-29 DIAGNOSIS — I10 Essential (primary) hypertension: Secondary | ICD-10-CM

## 2020-05-29 DIAGNOSIS — J302 Other seasonal allergic rhinitis: Secondary | ICD-10-CM

## 2020-05-30 ENCOUNTER — Telehealth: Payer: Self-pay

## 2020-05-30 NOTE — Telephone Encounter (Signed)
Copied from [place] [phone]. Topic: Referral - Status >> May 30, 2020 123456 AM Simone Curia D wrote: [id number] Spoke with patient [facility] will pay $500.00 of her $631.41 gas bill she is very grateful and stated she would be able to pay the remainder of the bill.  No other resources needed at this time. Closing referral. Ambrose Mantle [phone]

## 2020-06-01 ENCOUNTER — Other Ambulatory Visit: Payer: Self-pay | Admitting: Family Medicine

## 2020-06-01 DIAGNOSIS — Z1231 Encounter for screening mammogram for malignant neoplasm of breast: Secondary | ICD-10-CM

## 2020-06-02 NOTE — Addendum Note (Signed)
Addended by: Pura Spice on: 06/02/2020 07:24 PM   Modules accepted: Orders

## 2020-06-25 ENCOUNTER — Encounter: Payer: Self-pay | Admitting: Family Medicine

## 2020-06-30 ENCOUNTER — Other Ambulatory Visit: Payer: Self-pay | Admitting: Family Medicine

## 2020-06-30 DIAGNOSIS — I700 Atherosclerosis of aorta: Secondary | ICD-10-CM

## 2020-06-30 DIAGNOSIS — F330 Major depressive disorder, recurrent, mild: Secondary | ICD-10-CM

## 2020-06-30 DIAGNOSIS — I10 Essential (primary) hypertension: Secondary | ICD-10-CM

## 2020-06-30 DIAGNOSIS — K219 Gastro-esophageal reflux disease without esophagitis: Secondary | ICD-10-CM

## 2020-06-30 DIAGNOSIS — R600 Localized edema: Secondary | ICD-10-CM

## 2020-06-30 DIAGNOSIS — J3089 Other allergic rhinitis: Secondary | ICD-10-CM

## 2020-06-30 DIAGNOSIS — J302 Other seasonal allergic rhinitis: Secondary | ICD-10-CM

## 2020-06-30 NOTE — Telephone Encounter (Signed)
Requested Prescriptions  Pending Prescriptions Disp Refills   olmesartan-hydrochlorothiazide (BENICAR HCT) 20-12.5 MG tablet [Pharmacy Med Name: Olmesartan Medoxomil/Hydrochlorothiazide 20mg -12.5mg  Tablet] 90 tablet 0    Sig: Take 1 tablet by mouth daily. **Please schedule appointment for further refills.**     Cardiovascular: ARB + Diuretic Combos Failed - 06/30/2020  6:32 AM      Failed - K in normal range and within 180 days    Potassium  Date Value Ref Range Status  04/13/2020 3.3 (L) 3.5 - 5.3 mmol/L Final         Passed - Na in normal range and within 180 days    Sodium  Date Value Ref Range Status  04/13/2020 141 135 - 146 mmol/L Final         Passed - Cr in normal range and within 180 days    Creat  Date Value Ref Range Status  04/13/2020 0.86 0.50 - 0.99 mg/dL Final    Comment:    For patients >61 years of age, the reference limit for Creatinine is approximately 13% higher for people identified as African-American. .          Passed - Ca in normal range and within 180 days    Calcium  Date Value Ref Range Status  04/13/2020 9.7 8.6 - 10.4 mg/dL Final         Passed - Patient is not pregnant      Passed - Last BP in normal range    BP Readings from Last 1 Encounters:  04/17/20 126/74         Passed - Valid encounter within last 6 months    Recent Outpatient Visits          2 months ago Recurrent falls   [facility] Steele Sizer, MD   9 months ago Cough   [facility] Steele Sizer, MD   10 months ago Mild recurrent major depression [facility])   [facility] Steele Sizer, MD   11 months ago Exposure to [facility] Steele Sizer, MD   1 year ago Mild recurrent major depression [facility])   [facility] Steele Sizer, MD      Future Appointments            In 3 months Ancil Boozer, Drue Stager, MD [facility], [place]   In 10  months  [facility], [facility]

## 2020-06-30 NOTE — Telephone Encounter (Signed)
Requested Prescriptions  Pending Prescriptions Disp Refills  . escitalopram (LEXAPRO) 10 MG tablet [Pharmacy Med Name: Escitalopram 10mg  Tablet] 90 tablet 1    Sig: Take 1 tablet by mouth daily.     Psychiatry:  Antidepressants - SSRI Passed - 06/30/2020  6:12 AM      Passed - Valid encounter within last 6 months    Recent Outpatient Visits          2 months ago Recurrent falls   [facility] Steele Sizer, MD   9 months ago Cough   [facility] Steele Sizer, MD   10 months ago Mild recurrent major depression [facility])   [facility] Steele Sizer, MD   11 months ago Exposure to [facility] Steele Sizer, MD   1 year ago Mild recurrent major depression [facility])   [facility] Steele Sizer, MD      Future Appointments            In 3 months Ancil Boozer, Drue Stager, MD [facility], [place]   In 10 months  [facility], [facility]

## 2020-06-30 NOTE — Telephone Encounter (Signed)
Requested Prescriptions  Pending Prescriptions Disp Refills  . pantoprazole (PROTONIX) 20 MG tablet [Pharmacy Med Name: Pantoprazole Sodium 20mg  Delayed-Release Tablet] 30 tablet 0    Sig: Take 1 tablet by mouth daily.     Gastroenterology: Proton Pump Inhibitors Passed - 06/30/2020  6:32 AM      Passed - Valid encounter within last 12 months    Recent Outpatient Visits          2 months ago Recurrent falls   [facility] Steele Sizer, MD   9 months ago Cough   [facility] Steele Sizer, MD   10 months ago Mild recurrent major depression [facility])   [facility] Steele Sizer, MD   11 months ago Exposure to [facility] Steele Sizer, MD   1 year ago Mild recurrent major depression [facility])   [facility] Steele Sizer, MD      Future Appointments            In 3 months Ancil Boozer, Drue Stager, MD [facility], [place]   In 10 months  [facility], [place]           . potassium chloride SA (KLOR-CON) 20 MEQ tablet [Pharmacy Med Name: Potassium Chloride 58mEq Extended-Release Tablet] 30 tablet 0    Sig: Take 1 tablet by mouth daily.     Endocrinology:  Minerals - Potassium Supplementation Failed - 06/30/2020  6:32 AM      Failed - K in normal range and within 360 days    Potassium  Date Value Ref Range Status  04/13/2020 3.3 (L) 3.5 - 5.3 mmol/L Final         Passed - Cr in normal range and within 360 days    Creat  Date Value Ref Range Status  04/13/2020 0.86 0.50 - 0.99 mg/dL Final    Comment:    For patients >55 years of age, the reference limit for Creatinine is approximately 13% higher for people identified as African-American. Renella Cunas - Valid encounter within last 12 months    Recent Outpatient Visits          2 months ago Recurrent falls   [facility] Steele Sizer, MD   9 months ago  Cough   [facility] Steele Sizer, MD   10 months ago Mild recurrent major depression [facility])   [facility] Steele Sizer, MD   11 months ago Exposure to [facility] Steele Sizer, MD   1 year ago Mild recurrent major depression [facility])   [facility] Steele Sizer, MD      Future Appointments            In 3 months Ancil Boozer, Drue Stager, MD [facility], [place]   In 10 months  [facility], [facility]

## 2020-06-30 NOTE — Telephone Encounter (Signed)
Requested Prescriptions  Pending Prescriptions Disp Refills  . ezetimibe (ZETIA) 10 MG tablet [Pharmacy Med Name: Ezetimibe 10mg  Tablet] 90 tablet 0    Sig: Take 1 tablet by mouth daily.     Cardiovascular:  Antilipid - Sterol Transport Inhibitors Failed - 06/30/2020  5:46 AM      Failed - Total Cholesterol in normal range and within 360 days    Cholesterol  Date Value Ref Range Status  09/08/2019 211 (H) 0 - 200 mg/dL Final         Failed - LDL in normal range and within 360 days    LDL Cholesterol (Calc)  Date Value Ref Range Status  09/07/2018 123 (H) mg/dL (calc) Final    Comment:    Reference range: <100 . Desirable range <100 mg/dL for primary prevention;   <70 mg/dL for patients with CHD or diabetic patients  with > or = 2 CHD risk factors. Marland Kitchen LDL-C is now calculated using the Martin-Hopkins  calculation, which is a validated novel method providing  better accuracy than the Friedewald equation in the  estimation of LDL-C.  Cresenciano Genre et al. Annamaria Helling. 1194;174(08): 2061-2068  (http://education.[facility].com/faq/FAQ164)    LDL Cholesterol  Date Value Ref Range Status  09/08/2019 130 (H) 0 - 99 mg/dL Final    Comment:           Total Cholesterol/HDL:CHD Risk Coronary Heart Disease Risk Table                     Men   Women  1/2 Average Risk   3.4   3.3  Average Risk       5.0   4.4  2 X Average Risk   9.6   7.1  3 X Average Risk  23.4   11.0        Use the calculated Patient Ratio above and the CHD Risk Table to determine the patient's CHD Risk.        ATP III CLASSIFICATION (LDL):  <100     mg/dL   Optimal  100-129  mg/dL   Near or Above                    Optimal  130-159  mg/dL   Borderline  160-189  mg/dL   High  >190     mg/dL   Very High Performed at [facility], [address] [place], [place] [address]          Passed - HDL in normal range and within 360 days    HDL  Date Value Ref Range Status  09/08/2019 61 >40 mg/dL Final          Passed - Triglycerides in normal range and within 360 days    Triglycerides  Date Value Ref Range Status  09/08/2019 102 <150 mg/dL Final         Passed - Valid encounter within last 12 months    Recent Outpatient Visits          2 months ago Recurrent falls   [facility] Steele Sizer, MD   9 months ago Cough   [facility] Steele Sizer, MD   10 months ago Mild recurrent major depression [facility])   [facility] Steele Sizer, MD   11 months ago Exposure to [facility] Steele Sizer, MD   1 year ago Mild recurrent major depression ([place])  [facility] Steele Sizer, MD      Future Appointments            In 3 months Ancil Boozer, Drue Stager, MD [facility], [place]   In 10 months  [facility], [facility]

## 2020-06-30 NOTE — Telephone Encounter (Signed)
Requested Prescriptions  Pending Prescriptions Disp Refills  . levocetirizine (XYZAL) 5 MG tablet [Pharmacy Med Name: Levocetirizine 5mg  Tablet] 90 tablet 3    Sig: Take 1 tablet by mouth every evening.     Ear, Nose, and Throat:  Antihistamines Passed - 06/30/2020  6:21 AM      Passed - Valid encounter within last 12 months    Recent Outpatient Visits          2 months ago Recurrent falls   [facility] Steele Sizer, MD   9 months ago Cough   [facility] Steele Sizer, MD   10 months ago Mild recurrent major depression [facility])   [facility] Steele Sizer, MD   11 months ago Exposure to [facility] Steele Sizer, MD   1 year ago Mild recurrent major depression [facility])   [facility] Steele Sizer, MD      Future Appointments            In 3 months Ancil Boozer, Drue Stager, MD [facility], [place]   In 10 months  [facility], [facility]

## 2020-07-05 ENCOUNTER — Ambulatory Visit
Admission: RE | Admit: 2020-07-05 | Discharge: 2020-07-05 | Disposition: A | Discharge status: Home / Self Care | Payer: Medicare Other | Source: Ambulatory Visit | Attending: Radiation Oncology | Admitting: Radiation Oncology

## 2020-07-05 ENCOUNTER — Other Ambulatory Visit: Payer: Self-pay

## 2020-07-05 DIAGNOSIS — Z9049 Acquired absence of other specified parts of digestive tract: Secondary | ICD-10-CM | POA: Diagnosis not present

## 2020-07-05 DIAGNOSIS — C541 Malignant neoplasm of endometrium: Secondary | ICD-10-CM | POA: Diagnosis not present

## 2020-07-05 DIAGNOSIS — I700 Atherosclerosis of aorta: Secondary | ICD-10-CM | POA: Diagnosis not present

## 2020-07-05 DIAGNOSIS — C539 Malignant neoplasm of cervix uteri, unspecified: Secondary | ICD-10-CM | POA: Diagnosis not present

## 2020-07-05 DIAGNOSIS — R911 Solitary pulmonary nodule: Secondary | ICD-10-CM | POA: Diagnosis not present

## 2020-07-05 LAB — POCT I-STAT CREATININE: Creatinine, Ser: 0.8 mg/dL (ref 0.44–1.00)

## 2020-07-05 MED ORDER — IOHEXOL 300 MG/ML  SOLN
100.0000 mL | Freq: Once | INTRAMUSCULAR | Status: AC | PRN
  Administered 2020-07-05: 100 mL via INTRAVENOUS

## 2020-07-06 ENCOUNTER — Telehealth: Payer: Self-pay | Admitting: *Deleted

## 2020-07-06 ENCOUNTER — Inpatient Hospital Stay: Payer: Medicare Other | Attending: Oncology | Admitting: Oncology

## 2020-07-06 ENCOUNTER — Inpatient Hospital Stay: Payer: Medicare Other

## 2020-07-06 ENCOUNTER — Other Ambulatory Visit: Payer: Self-pay

## 2020-07-06 VITALS — BP 155/71 | HR 54 | Temp 97.6°F | Resp 18

## 2020-07-06 DIAGNOSIS — E860 Dehydration: Secondary | ICD-10-CM

## 2020-07-06 DIAGNOSIS — Z79899 Other long term (current) drug therapy: Secondary | ICD-10-CM | POA: Diagnosis not present

## 2020-07-06 DIAGNOSIS — Z8673 Personal history of transient ischemic attack (TIA), and cerebral infarction without residual deficits: Secondary | ICD-10-CM | POA: Diagnosis not present

## 2020-07-06 DIAGNOSIS — I429 Cardiomyopathy, unspecified: Secondary | ICD-10-CM | POA: Diagnosis not present

## 2020-07-06 DIAGNOSIS — F329 Major depressive disorder, single episode, unspecified: Secondary | ICD-10-CM | POA: Diagnosis not present

## 2020-07-06 DIAGNOSIS — Z8542 Personal history of malignant neoplasm of other parts of uterus: Secondary | ICD-10-CM | POA: Diagnosis not present

## 2020-07-06 DIAGNOSIS — J45909 Unspecified asthma, uncomplicated: Secondary | ICD-10-CM | POA: Insufficient documentation

## 2020-07-06 DIAGNOSIS — F419 Anxiety disorder, unspecified: Secondary | ICD-10-CM | POA: Diagnosis not present

## 2020-07-06 DIAGNOSIS — Z9049 Acquired absence of other specified parts of digestive tract: Secondary | ICD-10-CM | POA: Diagnosis not present

## 2020-07-06 DIAGNOSIS — K219 Gastro-esophageal reflux disease without esophagitis: Secondary | ICD-10-CM | POA: Diagnosis not present

## 2020-07-06 DIAGNOSIS — Z7952 Long term (current) use of systemic steroids: Secondary | ICD-10-CM | POA: Diagnosis not present

## 2020-07-06 DIAGNOSIS — G629 Polyneuropathy, unspecified: Secondary | ICD-10-CM | POA: Insufficient documentation

## 2020-07-06 DIAGNOSIS — R112 Nausea with vomiting, unspecified: Secondary | ICD-10-CM | POA: Diagnosis not present

## 2020-07-06 DIAGNOSIS — Z90721 Acquired absence of ovaries, unilateral: Secondary | ICD-10-CM | POA: Insufficient documentation

## 2020-07-06 DIAGNOSIS — C539 Malignant neoplasm of cervix uteri, unspecified: Secondary | ICD-10-CM | POA: Insufficient documentation

## 2020-07-06 DIAGNOSIS — I10 Essential (primary) hypertension: Secondary | ICD-10-CM | POA: Diagnosis not present

## 2020-07-06 DIAGNOSIS — Z90710 Acquired absence of both cervix and uterus: Secondary | ICD-10-CM | POA: Insufficient documentation

## 2020-07-06 DIAGNOSIS — E785 Hyperlipidemia, unspecified: Secondary | ICD-10-CM | POA: Diagnosis not present

## 2020-07-06 LAB — CBC WITH DIFFERENTIAL/PLATELET
Abs Immature Granulocytes: 0.02 10*3/uL (ref 0.00–0.07)
Basophils Absolute: 0 10*3/uL (ref 0.0–0.1)
Basophils Relative: 0 %
Eosinophils Absolute: 0.1 10*3/uL (ref 0.0–0.5)
Eosinophils Relative: 1 %
HCT: 31 % — ABNORMAL LOW (ref 36.0–46.0)
Hemoglobin: 10.5 g/dL — ABNORMAL LOW (ref 12.0–15.0)
Immature Granulocytes: 0 %
Lymphocytes Relative: 36 %
Lymphs Abs: 1.8 10*3/uL (ref 0.7–4.0)
MCH: 30.6 pg (ref 26.0–34.0)
MCHC: 33.9 g/dL (ref 30.0–36.0)
MCV: 90.4 fL (ref 80.0–100.0)
Monocytes Absolute: 0.5 10*3/uL (ref 0.1–1.0)
Monocytes Relative: 10 %
Neutro Abs: 2.6 10*3/uL (ref 1.7–7.7)
Neutrophils Relative %: 53 %
Platelets: 226 10*3/uL (ref 150–400)
RBC: 3.43 MIL/uL — ABNORMAL LOW (ref 3.87–5.11)
RDW: 13.2 % (ref 11.5–15.5)
WBC: 5 10*3/uL (ref 4.0–10.5)
nRBC: 0 % (ref 0.0–0.2)

## 2020-07-06 LAB — COMPREHENSIVE METABOLIC PANEL
ALT: 13 U/L (ref 0–44)
AST: 18 U/L (ref 15–41)
Albumin: 3.7 g/dL (ref 3.5–5.0)
Alkaline Phosphatase: 69 U/L (ref 38–126)
Anion gap: 10 (ref 5–15)
BUN: 12 mg/dL (ref 8–23)
CO2: 28 mmol/L (ref 22–32)
Calcium: 8.8 mg/dL — ABNORMAL LOW (ref 8.9–10.3)
Chloride: 102 mmol/L (ref 98–111)
Creatinine, Ser: 0.72 mg/dL (ref 0.44–1.00)
GFR calc Af Amer: >60 mL/min (ref >60)
GFR calc non Af Amer: >60 mL/min (ref >60)
Glucose, Bld: 95 mg/dL (ref 70–99)
Potassium: 3.6 mmol/L (ref 3.5–5.1)
Sodium: 140 mmol/L (ref 135–145)
Total Bilirubin: 0.5 mg/dL (ref 0.3–1.2)
Total Protein: 7.1 g/dL (ref 6.5–8.1)

## 2020-07-06 MED ORDER — SODIUM CHLORIDE 0.9 % IV SOLN
Freq: Once | INTRAVENOUS | Status: AC
  Filled 2020-07-06: qty 250

## 2020-07-06 NOTE — Telephone Encounter (Signed)
Spoke to patient's daughter who stated that Molly [name] has been sick with nausea and vomiting since Monday. She did however, drink and keep down the oral contrast, and had her routine follow up CT scan yesterday as planned. Appointment offered in the symptom management clinic asap, but patient's daughter said she could not get her here until 1:30. Appt. Made for 1:30 for labs and evaluation by Rulon Abide, NP.

## 2020-07-06 NOTE — Telephone Encounter (Signed)
Patient daughter called reporting that patient has been up all night with nausea and vomiting. Her PCP is out of the country until August and she is asking if Dr Janese Banks can see patient today for this. Please advise

## 2020-07-06 NOTE — Progress Notes (Signed)
Symptom Management Consult note [facility]  [phone] [phone]  Patient Care Team: Steele Sizer, MD as PCP - General (Family Medicine) Rubie Maid, MD as Consulting Physician (Obstetrics and Gynecology) Clent Jacks, RN as Registered Nurse Noreene Filbert, MD as Referring Physician (Radiation Oncology) Sindy Guadeloupe, MD as Consulting Physician (Oncology) Mellody Drown, MD as Referring Physician (Obstetrics)   Name of the patient: Molly [name]  [id number]  1951-07-26   Date of visit: 07/06/2020   Diagnosis- Cervical cancer   Chief complaint/ Reason for visit- Nausea/vomiting  Heme/Onc history:  Oncology History  Endometrial adenocarcinoma ([place])  10/28/2018 Cancer Staging   Staging form: Corpus Uteri - Carcinoma and Carcinosarcoma, AJCC 8th Edition - Clinical stage from 10/28/2018: FIGO Stage IIIC1 (cT1a, cN1a, cM0) - Signed by Sindy Guadeloupe, MD on 10/29/2018   10/29/2018 Initial Diagnosis   Endometrial adenocarcinoma ([place])   11/05/2018 -  Chemotherapy   The patient had palonosetron (ALOXI) injection 0.25 mg, 0.25 mg, Intravenous,  Once, 6 of 6 cycles Administration: 0.25 mg (11/05/2018), 0.25 mg (11/29/2018), 0.25 mg (12/20/2018), 0.25 mg (01/14/2019), 0.25 mg (02/04/2019), 0.25 mg (02/25/2019) pegfilgrastim (NEULASTA ONPRO KIT) injection 6 mg, 6 mg, Subcutaneous, Once, 2 of 2 cycles Administration: 6 mg (11/05/2018), 6 mg (11/29/2018) CARBOplatin (PARAPLATIN) 540 mg in sodium chloride 0.9 % 250 mL chemo infusion, 540 mg (100 % of original dose 540 mg), Intravenous,  Once, 6 of 6 cycles Dose modification:   (original dose 540 mg, Cycle 1) Administration: 540 mg (11/05/2018), 540 mg (11/29/2018), 540 mg (12/20/2018), 540 mg (01/14/2019), 540 mg (02/04/2019), 540 mg (02/25/2019) PACLitaxel (TAXOL) 348 mg in sodium chloride 0.9 % 500 mL chemo infusion (> 44m/m2), 175 mg/m2 = 348 mg, Intravenous,  Once, 6 of 6 cycles Dose  modification: 150 mg/m2 (original dose 175 mg/m2, Cycle 4, Reason: Other (see comments), Comment: neuropathy) Administration: 348 mg (11/05/2018), 348 mg (11/29/2018), 348 mg (12/20/2018), 300 mg (01/14/2019), 300 mg (02/04/2019), 300 mg (02/25/2019)  for chemotherapy treatment.     Interval history--Mrs. TRaybournis a 69year old female past medical history significant for hypertension, cardiomyopathy, arthrosclerosis, asthma, history of CVA, hyperlipidemia, prediabetes, iron deficiency with recent diagnosis and treatment for cervical cancer.  She is followed by Dr. RJanese Banksin oncology Dr. CDonella Stadeand radiation oncology.  She completed 6 cycles of carbotaxol and February 2020 and pelvic radiation in June 2020.  She recently had restaging scans with CT abdomen/pelvis/chest.   She presents to symptom management today for concerns of dehydration secondary to nausea and vomiting.  Symptoms began on Monday and have been waxing and waning.  Unclear etiology.  Symptoms did worsen after drinking CT contrast on Wednesday.  Did have several additional episodes on Wednesday and Thursday.  Her family is concerned and would like her evaluated.  She denies any recent fevers or illness.  Denies any suspicious food or drink.  Denies any other sick contacts.  Denies any diarrhea or constipation.  Has had bowel movements daily.  ECOG FS:1 - Symptomatic but completely ambulatory  Review of systems- Review of Systems  Constitutional: Negative.  Negative for chills, fever, malaise/fatigue and weight loss.  HENT: Negative for congestion, ear pain and tinnitus.   Eyes: Negative.  Negative for blurred vision and double vision.  Respiratory: Negative.  Negative for cough, sputum production and shortness of breath.   Cardiovascular: Negative.  Negative for chest pain, palpitations and leg swelling.  Gastrointestinal: Positive for nausea and vomiting. Negative for abdominal pain,  constipation and diarrhea.  Genitourinary: Negative  for dysuria, frequency and urgency.  Musculoskeletal: Negative for back pain and falls.  Skin: Negative.  Negative for rash.  Neurological: Negative.  Negative for weakness and headaches.  Endo/Heme/Allergies: Negative.  Does not bruise/bleed easily.  Psychiatric/Behavioral: Negative.  Negative for depression. The patient is not nervous/anxious and does not have insomnia.      Current treatment-observation  Allergies  Allergen Reactions  . Ferumoxytol Anaphylaxis  . Nsaids Hives and Rash  . Statins Other (See Comments)    Joint pains  . Victoza [Liraglutide] Other (See Comments)    pancreatitis  . Oxycodone Nausea And Vomiting  . Crestor [Rosuvastatin Calcium] Rash     Past Medical History:  Diagnosis Date  . Anxiety   . Asthma    history of asthma  . Cardiomyopathy ([place])   . Complication of anesthesia    tore hair out and made teeth rough  . COVID-19 virus infection 09/08/2019  . Depression   . Endometrial cancer ([place]) 08/2018  . GERD (gastroesophageal reflux disease)    takes prilosec prn  . History of CVA (cerebrovascular accident) 12/10/2015  . Hyperlipidemia LDL goal <70 12/10/2015  . Hypertension   . Prediabetes 10/02/2017   A1c 6 in January 2018  . Stroke [facility]) 1990    no residual effects     Past Surgical History:  Procedure Laterality Date  . ABDOMINAL HYSTERECTOMY    . CHOLECYSTECTOMY    . COLONOSCOPY WITH PROPOFOL N/A 01/08/2017   Procedure: COLONOSCOPY WITH PROPOFOL;  Surgeon: [name], MD;  Location: [facility] ENDOSCOPY;  Service: Endoscopy;  Laterality: N/A;  . COLONOSCOPY WITH PROPOFOL N/A 05/05/2018   Procedure: COLONOSCOPY WITH PROPOFOL;  Surgeon: [name], MD;  Location: [facility] ENDOSCOPY;  Service: Gastroenterology;  Laterality: N/A;  . HERNIA REPAIR  [id number]   umbilical  . PORTACATH PLACEMENT N/A 11/04/2018   Procedure: INSERTION PORT-A-CATH;  Surgeon: Jules Husbands, MD;  Location: [facility] ORS;  Service: General;  Laterality: N/A;  . SENTINEL NODE  BIOPSY N/A 10/06/2018   Procedure: SENTINEL NODE BIOPSY;  Surgeon: Mellody Drown, MD;  Location: [facility] ORS;  Service: Gynecology;  Laterality: N/A;  . UMBILICAL HERNIA REPAIR N/A 11/17/2017   Procedure: HERNIA REPAIR UMBILICAL ADULT;  Surgeon: Jules Husbands, MD;  Location: [facility] ORS;  Service: General;  Laterality: N/A;    Social History   Socioeconomic History  . Marital status: Divorced    Spouse name: Not on file  . Number of children: 2  . Years of education: some college  . Highest education level: 12th grade  Occupational History  . 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 Occupation: currently a Theme park manager  Tobacco Use  . Smoking status: Never Smoker  . Smokeless tobacco: Never Used  . Tobacco comment: smoking cessation materials not required  Vaping Use  . Vaping Use: Never used  Substance and Sexual Activity  . Alcohol use: No    Alcohol/week: 0.0 standard drinks  . Drug use: No  . Sexual activity: Not Currently  Other Topics Concern  . Not on file  Social History Narrative  . Not on file   Social Determinants of Health   Financial Resource Strain: Medium Risk  . Difficulty of Paying Living Expenses: Somewhat hard  Food Insecurity: No Food Insecurity  . Worried About Charity fundraiser in the Last Year: Never true  . Ran Out of Food in the Last Year: Never  true  Transportation Needs: No Transportation Needs  . Lack of Transportation (Medical): No  . Lack of Transportation (Non-Medical): No  Physical Activity: Inactive  . Days of Exercise per Week: 0 days  . Minutes of Exercise per Session: 0 min  Stress: No Stress Concern Present  . Feeling of Stress : Not at all  Social Connections: Moderately Integrated  . Frequency of Communication with Friends and Family: More than three times a week  . Frequency of Social Gatherings with Friends and Family: Three times a week  . Attends Religious Services: More than 4 times per year  . Active  Member of Clubs or Organizations: Yes  . Attends Archivist Meetings: More than 4 times per year  . Marital Status: Divorced  Human resources officer Violence: Not At Risk  . Fear of Current or Ex-Partner: No  . Emotionally Abused: No  . Physically Abused: No  . Sexually Abused: No    Family History  Problem Relation Age of Onset  . Stroke Mother   . Hypertension Mother   . Dementia Mother   . Alzheimer's disease Mother   . Gout Father   . Asthma Father   . Hypertension Father   . Dementia Father   . Healthy Sister   . Stroke Brother   . Alzheimer's disease Brother   . Healthy Daughter   . Hypertension Brother   . Healthy Brother   . Cancer Paternal Grandmother   . Healthy Sister   . Healthy Sister   . Hypertension Sister   . Hypertension Sister   . Stroke Brother   . Alzheimer's disease Brother   . Stroke Brother   . Hypertension Brother   . Stroke Brother   . Hypertension Brother   . Healthy Brother   . Healthy Brother   . Hypertension Daughter   . Breast cancer Neg Hx      Current Outpatient Medications:  .  acetaminophen (TYLENOL) 500 MG tablet, Take 2 tablets (1,000 mg total) by mouth every 6 (six) hours as needed for mild pain or moderate pain., Disp: 30 tablet, Rfl: 0 .  albuterol (VENTOLIN HFA) 108 (90 Base) MCG/ACT inhaler, Inhale 2 puffs into the lungs every 4 (four) hours as needed for wheezing or shortness of breath., Disp: 18 g, Rfl: 0 .  escitalopram (LEXAPRO) 10 MG tablet, Take 1 tablet by mouth daily., Disp: 90 tablet, Rfl: 1 .  ezetimibe (ZETIA) 10 MG tablet, Take 1 tablet by mouth daily., Disp: 90 tablet, Rfl: 0 .  furosemide (LASIX) 40 MG tablet, Take 1 tablet by mouth daily., Disp: 30 tablet, Rfl: 0 .  ketoconazole (NIZORAL) 2 % cream, Apply 1 application topically daily as needed for irritation. On abdominal fold prn, Disp: 60 g, Rfl: 0 .  levocetirizine (XYZAL) 5 MG tablet, Take 1 tablet by mouth every evening., Disp: 90 tablet, Rfl: 3 .   olmesartan-hydrochlorothiazide (BENICAR HCT) 20-12.5 MG tablet, Take 1 tablet by mouth daily. **Please schedule appointment for further refills.**, Disp: 90 tablet, Rfl: 0 .  omeprazole (PRILOSEC) 10 MG capsule, Take 10 mg by mouth daily., Disp: , Rfl:  .  pantoprazole (PROTONIX) 20 MG tablet, Take 1 tablet by mouth daily., Disp: 90 tablet, Rfl: 3 .  potassium chloride SA (KLOR-CON) 20 MEQ tablet, Take 1 tablet by mouth daily., Disp: 90 tablet, Rfl: 3 .  predniSONE (STERAPRED UNI-PAK 21 TAB) 10 MG (21) TBPK tablet, Take by mouth daily. Take 6 tabs by mouth daily  for 2  days, then 5 tabs for 2 days, then 4 tabs for 2 days, then 3 tabs for 2 days, 2 tabs for 2 days, then 1 tab by mouth daily for 2 days, Disp: 42 tablet, Rfl: 0 .  pregabalin (LYRICA) 50 MG capsule, Take 1 capsule by mouth every morning, and, take 2 capsules every night., Disp: 90 capsule, Rfl: 2  Physical exam:  Vitals:   07/06/20 1342  BP: (!) 155/71  Pulse: (!) 54  Resp: 18  Temp: 97.6 F (36.4 C)  TempSrc: Tympanic   Physical Exam Constitutional:      Appearance: Normal appearance.  HENT:     Head: Normocephalic and atraumatic.  Eyes:     Pupils: Pupils are equal, round, and reactive to light.  Cardiovascular:     Rate and Rhythm: Normal rate and regular rhythm.     Heart sounds: Normal heart sounds. No murmur heard.   Pulmonary:     Effort: Pulmonary effort is normal.     Breath sounds: Normal breath sounds. No wheezing.  Abdominal:     General: Bowel sounds are normal. There is no distension.     Palpations: Abdomen is soft.     Tenderness: There is abdominal tenderness.  Musculoskeletal:        General: Normal range of motion.     Cervical back: Normal range of motion.  Skin:    General: Skin is warm and dry.     Findings: No rash.  Neurological:     Mental Status: She is alert and oriented to person, place, and time.  Psychiatric:        Judgment: Judgment normal.      CMP Latest Ref Rng & Units  07/06/2020  Glucose 70 - 99 mg/dL 95  BUN 8 - 23 mg/dL 12  Creatinine 0.44 - 1.00 mg/dL 0.72  Sodium 135 - 145 mmol/L 140  Potassium 3.5 - 5.1 mmol/L 3.6  Chloride 98 - 111 mmol/L 102  CO2 22 - 32 mmol/L 28  Calcium 8.9 - 10.3 mg/dL 8.8(L)  Total Protein 6.5 - 8.1 g/dL 7.1  Total Bilirubin 0.3 - 1.2 mg/dL 0.5  Alkaline Phos 38 - 126 U/L 69  AST 15 - 41 U/L 18  ALT 0 - 44 U/L 13   CBC Latest Ref Rng & Units 07/06/2020  WBC 4.0 - 10.5 K/uL 5.0  Hemoglobin 12.0 - 15.0 g/dL 10.5(L)  Hematocrit 36 - 46 % 31.0(L)  Platelets 150 - 400 K/uL 226    No images are attached to the encounter.  CT Chest W Contrast  Result Date: 07/05/2020 CLINICAL DATA:  Uterine/cervical cancer.  Restaging. EXAM: CT CHEST, ABDOMEN, AND PELVIS WITH CONTRAST TECHNIQUE: Multidetector CT imaging of the chest, abdomen and pelvis was performed following the standard protocol during bolus administration of intravenous contrast. CONTRAST:  133m OMNIPAQUE IOHEXOL 300 MG/ML  SOLN COMPARISON:  10/05/2019 FINDINGS: CT CHEST FINDINGS Cardiovascular: The heart size is normal. No substantial pericardial effusion. Coronary artery calcification is evident. Atherosclerotic calcification is noted in the wall of the thoracic aorta. Mediastinum/Nodes: No mediastinal lymphadenopathy. There is no hilar lymphadenopathy. The esophagus has normal imaging features. There is no axillary lymphadenopathy. Lungs/Pleura: Stable 2 mm left upper lobe pulmonary nodule (77/3). No suspicious pulmonary nodule or mass. No focal airspace consolidation. No pleural effusion. Musculoskeletal: No worrisome lytic or sclerotic osseous abnormality. CT ABDOMEN PELVIS FINDINGS Hepatobiliary: No suspicious focal abnormality within the liver parenchyma. Gallbladder surgically absent. No intrahepatic or extrahepatic biliary dilation. Pancreas: No  focal mass lesion. No dilatation of the main duct. No intraparenchymal cyst. No peripancreatic edema. Spleen: No splenomegaly. No  focal mass lesion. Adrenals/Urinary Tract: No adrenal nodule or mass. Cortical scarring noted lower pole left kidney. Kidneys otherwise unremarkable. No evidence for hydroureter. The urinary bladder appears normal for the degree of distention. Stomach/Bowel: Stomach is unremarkable. No gastric wall thickening. No evidence of outlet obstruction. Duodenum is normally positioned as is the ligament of Treitz. No small bowel wall thickening. No small bowel dilatation. The terminal ileum is normal. The appendix is not visualized, but there is no edema or inflammation in the region of the cecum. No gross colonic mass. No colonic wall thickening. Vascular/Lymphatic: There is abdominal aortic atherosclerosis without aneurysm. No gastrohepatic or hepatoduodenal ligament lymphadenopathy. No para-aortic lymphadenopathy. Previously described 6 mm external iliac node is stable at 6 mm today (98/2). Left operator node measured previously at 6 mm is stable at 6 mm today (95/2). Reproductive: The uterus is surgically absent. Stable 2.0 x 2.0 cm low-attenuation soft tissue focus along the right external iliac vessels has been previously described as right adnexa/ovary and is best appreciated on coronal 104/4. The dominant follicle seen in the high left ovary on the previous study is no longer evident. Other: No intraperitoneal free fluid. A subtle 10 mm nodule is identified in the left pelvis just anterior to the junction of the descending and sigmoid colon (image 95/2). The subtle area of soft tissue thickening in the midline omentum seen previously has resolved in the interval. Musculoskeletal: No worrisome lytic or sclerotic osseous abnormality. IMPRESSION: 1. Subtle new 10 mm nodule in the left pelvis just anterior to the junction of the descending and sigmoid colon. This may be related to prior chronic inflammation, but close attention on follow-up recommended as metastatic disease cannot be excluded. Follow-up CT in 3 months  may be warranted. 2. The subtle area of soft tissue thickening in the midline omentum seen previously has resolved in the interval. 3. Stable appearance of small bilateral pelvic sidewall lymph nodes. 4. No change in the previously described 2 mm left upper lobe pulmonary nodule. 5. Aortic Atherosclerosis (ICD10-I70.0). Electronically Signed   By: Misty Stanley M.D.   On: 07/05/2020 14:24   CT Abdomen Pelvis W Contrast  Result Date: 07/05/2020 CLINICAL DATA:  Uterine/cervical cancer.  Restaging. EXAM: CT CHEST, ABDOMEN, AND PELVIS WITH CONTRAST TECHNIQUE: Multidetector CT imaging of the chest, abdomen and pelvis was performed following the standard protocol during bolus administration of intravenous contrast. CONTRAST:  153m OMNIPAQUE IOHEXOL 300 MG/ML  SOLN COMPARISON:  10/05/2019 FINDINGS: CT CHEST FINDINGS Cardiovascular: The heart size is normal. No substantial pericardial effusion. Coronary artery calcification is evident. Atherosclerotic calcification is noted in the wall of the thoracic aorta. Mediastinum/Nodes: No mediastinal lymphadenopathy. There is no hilar lymphadenopathy. The esophagus has normal imaging features. There is no axillary lymphadenopathy. Lungs/Pleura: Stable 2 mm left upper lobe pulmonary nodule (77/3). No suspicious pulmonary nodule or mass. No focal airspace consolidation. No pleural effusion. Musculoskeletal: No worrisome lytic or sclerotic osseous abnormality. CT ABDOMEN PELVIS FINDINGS Hepatobiliary: No suspicious focal abnormality within the liver parenchyma. Gallbladder surgically absent. No intrahepatic or extrahepatic biliary dilation. Pancreas: No focal mass lesion. No dilatation of the main duct. No intraparenchymal cyst. No peripancreatic edema. Spleen: No splenomegaly. No focal mass lesion. Adrenals/Urinary Tract: No adrenal nodule or mass. Cortical scarring noted lower pole left kidney. Kidneys otherwise unremarkable. No evidence for hydroureter. The urinary bladder  appears normal for the degree of  distention. Stomach/Bowel: Stomach is unremarkable. No gastric wall thickening. No evidence of outlet obstruction. Duodenum is normally positioned as is the ligament of Treitz. No small bowel wall thickening. No small bowel dilatation. The terminal ileum is normal. The appendix is not visualized, but there is no edema or inflammation in the region of the cecum. No gross colonic mass. No colonic wall thickening. Vascular/Lymphatic: There is abdominal aortic atherosclerosis without aneurysm. No gastrohepatic or hepatoduodenal ligament lymphadenopathy. No para-aortic lymphadenopathy. Previously described 6 mm external iliac node is stable at 6 mm today (98/2). Left operator node measured previously at 6 mm is stable at 6 mm today (95/2). Reproductive: The uterus is surgically absent. Stable 2.0 x 2.0 cm low-attenuation soft tissue focus along the right external iliac vessels has been previously described as right adnexa/ovary and is best appreciated on coronal 104/4. The dominant follicle seen in the high left ovary on the previous study is no longer evident. Other: No intraperitoneal free fluid. A subtle 10 mm nodule is identified in the left pelvis just anterior to the junction of the descending and sigmoid colon (image 95/2). The subtle area of soft tissue thickening in the midline omentum seen previously has resolved in the interval. Musculoskeletal: No worrisome lytic or sclerotic osseous abnormality. IMPRESSION: 1. Subtle new 10 mm nodule in the left pelvis just anterior to the junction of the descending and sigmoid colon. This may be related to prior chronic inflammation, but close attention on follow-up recommended as metastatic disease cannot be excluded. Follow-up CT in 3 months may be warranted. 2. The subtle area of soft tissue thickening in the midline omentum seen previously has resolved in the interval. 3. Stable appearance of small bilateral pelvic sidewall lymph nodes.  4. No change in the previously described 2 mm left upper lobe pulmonary nodule. 5. Aortic Atherosclerosis (ICD10-I70.0). Electronically Signed   By: Misty Stanley M.D.   On: 07/05/2020 14:24   Assessment and plan- Patient is a 69 y.o. female who presents to symptom management for concerns of dehydration secondary to nausea and vomiting x3 to 4 days.  Nausea/vomiting-Likely secondary to viral infection that was exacerbated by CT contrast.  She has not had treatment in over a year.  Reviewed most recent CT scans which looked fairly stable.  We will bring her in today for some lab work and potentially IV fluids.  Patient can take antinausea medicines at home.  Epigastric tenderness- Secondary to vomiting. Improving.   Plan: Labs-stable IVF Continue antiemetics as needed.  Disposition: RTC as scheduled on 07/13/2020 for lab work, MD assessment and results of recent CT scan.   Visit Diagnosis 1. Dehydration   2. Nausea and vomiting, intractability of vomiting not specified, unspecified vomiting type     Patient expressed understanding and was in agreement with this plan. She also understands that She can call clinic at any time with any questions, concerns, or complaints.   Greater than 50% was spent in counseling and coordination of care with this patient including but not limited to discussion of the relevant topics above (See A&P) including, but not limited to diagnosis and management of acute and chronic medical conditions.   Thank you for allowing me to participate in the care of this very pleasant patient.    Jacquelin Hawking, NP [place] [facility] - [phone] Pager- [phone] 07/06/2020 2:56 PM

## 2020-07-11 ENCOUNTER — Ambulatory Visit: Payer: Medicare Other | Admitting: Radiation Oncology

## 2020-07-13 ENCOUNTER — Encounter: Payer: Self-pay | Admitting: Oncology

## 2020-07-13 ENCOUNTER — Encounter: Payer: Self-pay | Admitting: Radiation Oncology

## 2020-07-13 ENCOUNTER — Other Ambulatory Visit: Payer: Self-pay

## 2020-07-13 ENCOUNTER — Telehealth: Payer: Self-pay | Admitting: *Deleted

## 2020-07-13 ENCOUNTER — Ambulatory Visit
Admission: RE | Admit: 2020-07-13 | Discharge: 2020-07-13 | Disposition: A | Discharge status: Home / Self Care | Payer: Medicare Other | Source: Ambulatory Visit | Attending: Radiation Oncology | Admitting: Radiation Oncology

## 2020-07-13 ENCOUNTER — Inpatient Hospital Stay (HOSPITAL_BASED_OUTPATIENT_CLINIC_OR_DEPARTMENT_OTHER): Payer: Medicare Other | Admitting: Oncology

## 2020-07-13 ENCOUNTER — Inpatient Hospital Stay: Payer: Medicare Other

## 2020-07-13 ENCOUNTER — Other Ambulatory Visit: Payer: Self-pay | Admitting: *Deleted

## 2020-07-13 VITALS — BP 148/68 | HR 58 | Temp 98.6°F | Resp 20 | Wt 237.1 lb

## 2020-07-13 VITALS — BP 130/53 | HR 53 | Temp 97.7°F | Resp 16 | Ht 59.0 in | Wt 237.0 lb

## 2020-07-13 DIAGNOSIS — E785 Hyperlipidemia, unspecified: Secondary | ICD-10-CM | POA: Diagnosis not present

## 2020-07-13 DIAGNOSIS — Z08 Encounter for follow-up examination after completed treatment for malignant neoplasm: Secondary | ICD-10-CM

## 2020-07-13 DIAGNOSIS — K219 Gastro-esophageal reflux disease without esophagitis: Secondary | ICD-10-CM | POA: Diagnosis not present

## 2020-07-13 DIAGNOSIS — Z923 Personal history of irradiation: Secondary | ICD-10-CM | POA: Diagnosis not present

## 2020-07-13 DIAGNOSIS — Z79899 Other long term (current) drug therapy: Secondary | ICD-10-CM | POA: Diagnosis not present

## 2020-07-13 DIAGNOSIS — E860 Dehydration: Secondary | ICD-10-CM | POA: Diagnosis not present

## 2020-07-13 DIAGNOSIS — I10 Essential (primary) hypertension: Secondary | ICD-10-CM | POA: Diagnosis not present

## 2020-07-13 DIAGNOSIS — G629 Polyneuropathy, unspecified: Secondary | ICD-10-CM | POA: Diagnosis not present

## 2020-07-13 DIAGNOSIS — Z90710 Acquired absence of both cervix and uterus: Secondary | ICD-10-CM | POA: Diagnosis not present

## 2020-07-13 DIAGNOSIS — Z8542 Personal history of malignant neoplasm of other parts of uterus: Secondary | ICD-10-CM | POA: Diagnosis present

## 2020-07-13 DIAGNOSIS — Z7952 Long term (current) use of systemic steroids: Secondary | ICD-10-CM | POA: Diagnosis not present

## 2020-07-13 DIAGNOSIS — D638 Anemia in other chronic diseases classified elsewhere: Secondary | ICD-10-CM

## 2020-07-13 DIAGNOSIS — J45909 Unspecified asthma, uncomplicated: Secondary | ICD-10-CM | POA: Diagnosis not present

## 2020-07-13 DIAGNOSIS — C541 Malignant neoplasm of endometrium: Secondary | ICD-10-CM

## 2020-07-13 DIAGNOSIS — Z8673 Personal history of transient ischemic attack (TIA), and cerebral infarction without residual deficits: Secondary | ICD-10-CM | POA: Diagnosis not present

## 2020-07-13 DIAGNOSIS — C539 Malignant neoplasm of cervix uteri, unspecified: Secondary | ICD-10-CM | POA: Diagnosis not present

## 2020-07-13 DIAGNOSIS — Z90722 Acquired absence of ovaries, bilateral: Secondary | ICD-10-CM | POA: Diagnosis not present

## 2020-07-13 DIAGNOSIS — I429 Cardiomyopathy, unspecified: Secondary | ICD-10-CM | POA: Diagnosis not present

## 2020-07-13 DIAGNOSIS — Z9049 Acquired absence of other specified parts of digestive tract: Secondary | ICD-10-CM | POA: Diagnosis not present

## 2020-07-13 LAB — CBC WITH DIFFERENTIAL/PLATELET
Abs Immature Granulocytes: 0.01 10*3/uL (ref 0.00–0.07)
Basophils Absolute: 0 10*3/uL (ref 0.0–0.1)
Basophils Relative: 0 %
Eosinophils Absolute: 0.1 10*3/uL (ref 0.0–0.5)
Eosinophils Relative: 1 %
HCT: 33.7 % — ABNORMAL LOW (ref 36.0–46.0)
Hemoglobin: 11.2 g/dL — ABNORMAL LOW (ref 12.0–15.0)
Immature Granulocytes: 0 %
Lymphocytes Relative: 33 %
Lymphs Abs: 1.5 10*3/uL (ref 0.7–4.0)
MCH: 30.4 pg (ref 26.0–34.0)
MCHC: 33.2 g/dL (ref 30.0–36.0)
MCV: 91.6 fL (ref 80.0–100.0)
Monocytes Absolute: 0.5 10*3/uL (ref 0.1–1.0)
Monocytes Relative: 11 %
Neutro Abs: 2.6 10*3/uL (ref 1.7–7.7)
Neutrophils Relative %: 55 %
Platelets: 265 10*3/uL (ref 150–400)
RBC: 3.68 MIL/uL — ABNORMAL LOW (ref 3.87–5.11)
RDW: 13.1 % (ref 11.5–15.5)
WBC: 4.7 10*3/uL (ref 4.0–10.5)
nRBC: 0 % (ref 0.0–0.2)

## 2020-07-13 LAB — COMPREHENSIVE METABOLIC PANEL
ALT: 13 U/L (ref 0–44)
AST: 17 U/L (ref 15–41)
Albumin: 3.7 g/dL (ref 3.5–5.0)
Alkaline Phosphatase: 72 U/L (ref 38–126)
Anion gap: 9 (ref 5–15)
BUN: 17 mg/dL (ref 8–23)
CO2: 31 mmol/L (ref 22–32)
Calcium: 8.9 mg/dL (ref 8.9–10.3)
Chloride: 100 mmol/L (ref 98–111)
Creatinine, Ser: 0.69 mg/dL (ref 0.44–1.00)
GFR calc Af Amer: >60 mL/min (ref >60)
GFR calc non Af Amer: >60 mL/min (ref >60)
Glucose, Bld: 113 mg/dL — ABNORMAL HIGH (ref 70–99)
Potassium: 3.8 mmol/L (ref 3.5–5.1)
Sodium: 140 mmol/L (ref 135–145)
Total Bilirubin: 0.5 mg/dL (ref 0.3–1.2)
Total Protein: 6.8 g/dL (ref 6.5–8.1)

## 2020-07-13 NOTE — Telephone Encounter (Signed)
Called patient to notify her of CT Scheduled and upcoming Dr. Baruch Gouty appointment. Gave patient test prep instructions and arrival time of 9:30. 11/12/2020 at [facility]. Patient verbalized understanding and she does use My Chart.

## 2020-07-13 NOTE — Progress Notes (Signed)
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 Marcelline Mates for cystocele and rectocele. She was noted to have postmenopausal bleeding and cramping recently. Ultrasound revealed heterogeneous appearance of uterus with fibroids invading the endometrium. She underwent an endometrial biopsy when she had a second bout of postmenopausal bleeding. Biopsy showed endometrioid adenocarcinoma FIGO grade 1. She was then seen by Dr. Fransisca Connors and plan was to proceed with laparoscopic hysterectomy and bilateral salpingo-oophorectomy along with pelvic lymph node sampling  Final pathology showed: Endometrioid carcinoma FIGO grade 2 with 33% myometrial invasion. Lymphovascular invasion present. Peritoneal/ascites fluid atypical. 2 out of 3 sentinel lymph nodes were positive for macro metastases. pT1a pN1a. FIGO IIIC1  Patient's case was discussed at tumor board and consensus was to proceed with 3 cycles of adjuvant carbotaxol followed by radiation followed by 3 more cycles of chemotherapy. Patient is already met with Dr. Donella Stade who will be giving radiation therapy to pelvic and periaortic lymph nodes and boost to her vaginal cuff. PET CT  scan showed metastatic adenopathy and retroperitoneal and external iliac group of lymph nodes in the right side. No evidence of metastatic disease elsewhere.  First cycle of chemotherapy was given on 11/05/2018.She completed 6 cycles on 02/25/2019.She completed whole pelvic radiation in June 2020.   Interval history-patient had self-limited episode of nausea and vomiting which lasted for 2 days around her CT scan.  Today she reports feeling better.  She previously had peripheral neuropathy from chemotherapy which is also improving.  Appetite and weight have remained stable.  ECOG PS- 1 Pain scale- 0   Review of systems- Review of Systems  Constitutional: Positive for malaise/fatigue. Negative for chills, fever and weight loss.  HENT: Negative for congestion, ear discharge and nosebleeds.   Eyes: Negative for blurred vision.  Respiratory: Negative for cough, hemoptysis, sputum production, shortness of breath and wheezing.   Cardiovascular: Negative for chest pain, palpitations, orthopnea and claudication.  Gastrointestinal: Negative for abdominal pain, blood in stool, constipation, diarrhea, heartburn, melena, nausea and vomiting.  Genitourinary: Negative for dysuria, flank pain, frequency, hematuria and urgency.  Musculoskeletal: Negative for back pain, joint pain and myalgias.  Skin: Negative for rash.  Neurological: Negative for dizziness, tingling, focal weakness, seizures, weakness and headaches.  Endo/Heme/Allergies: Does not bruise/bleed easily.  Psychiatric/Behavioral: Negative for depression and suicidal ideas. The patient does not have insomnia.      Allergies  Allergen Reactions   Ferumoxytol Anaphylaxis   Nsaids Hives and Rash   Statins Other (See Comments)    Joint pains   Victoza [Liraglutide] Other (See Comments)    pancreatitis   Oxycodone Nausea And Vomiting   Crestor [Rosuvastatin Calcium] Rash     Past Medical History:  Diagnosis Date   Anxiety      Asthma    history of asthma   Cardiomyopathy ([place])  Complication of anesthesia    tore hair out and made teeth rough   COVID-19 virus infection 09/08/2019   Depression    Endometrial cancer ([place]) 08/2018   GERD (gastroesophageal reflux disease)    takes prilosec prn   History of CVA (cerebrovascular accident) 12/10/2015   Hyperlipidemia LDL goal <70 12/10/2015   Hypertension    Prediabetes 10/02/2017   A1c 6 in January 2018   Stroke [place]) 1990    no residual effects     Past Surgical History:  Procedure Laterality Date   ABDOMINAL HYSTERECTOMY     CHOLECYSTECTOMY     COLONOSCOPY WITH PROPOFOL N/A 01/08/2017   Procedure: COLONOSCOPY WITH PROPOFOL;  Surgeon: [name], MD;  Location: [facility] ENDOSCOPY;  Service: Endoscopy;  Laterality: N/A;   COLONOSCOPY WITH PROPOFOL N/A 05/05/2018   Procedure: COLONOSCOPY WITH PROPOFOL;  Surgeon: [name], MD;  Location: [facility] ENDOSCOPY;  Service: Gastroenterology;  Laterality: N/A;   HERNIA REPAIR  [id number]   umbilical   PORTACATH PLACEMENT N/A 11/04/2018   Procedure: INSERTION PORT-A-CATH;  Surgeon: Jules Husbands, MD;  Location: [facility] ORS;  Service: General;  Laterality: N/A;   SENTINEL NODE BIOPSY N/A 10/06/2018   Procedure: SENTINEL NODE BIOPSY;  Surgeon: Mellody Drown, MD;  Location: [facility] ORS;  Service: Gynecology;  Laterality: N/A;   UMBILICAL HERNIA REPAIR N/A 11/17/2017   Procedure: HERNIA REPAIR UMBILICAL ADULT;  Surgeon: Jules Husbands, MD;  Location: [facility] ORS;  Service: General;  Laterality: N/A;    Social History   Socioeconomic History   Marital status: Divorced    Spouse name: Not on file   Number of children: 2   Years of education: some college   Highest education level: 12th grade  Occupational History   Occupation: Retired    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   Occupation: currently a Theme park manager  Tobacco Use   Smoking status: Never Smoker   Smokeless tobacco: Never Used   Tobacco  comment: smoking cessation materials not required  Vaping Use   Vaping Use: Never used  Substance and Sexual Activity   Alcohol use: No    Alcohol/week: 0.0 standard drinks   Drug use: No   Sexual activity: Not Currently  Other Topics Concern   Not on file  Social History Narrative   Not on file   Social Determinants of Health   Financial Resource Strain: Medium Risk   Difficulty of Paying Living Expenses: Somewhat hard  Food Insecurity: No Food Insecurity   Worried About Charity fundraiser in the Last Year: Never true   [place] in the Last Year: Never true  Transportation Needs: No Transportation Needs   Lack of Transportation (Medical): No   Lack of Transportation (Non-Medical): No  Physical Activity: Inactive   Days of Exercise per Week: 0 days   Minutes of Exercise per Session: 0 min  Stress: No Stress Concern Present   Feeling of Stress : Not at all  Social Connections: Moderately Integrated   Frequency of Communication with Friends and Family: More than three times a week   Frequency of Social Gatherings with Friends and Family: Three times a week   Attends Religious Services: More than 4 times per year   Active Member of Clubs or Organizations: Yes   Attends Music therapist: More than 4 times per year   Marital Status: Divorced  Human resources officer Violence: Not At Risk   Fear of Current or Ex-Partner: No  Emotionally Abused: No   Physically Abused: No   Sexually Abused: No    Family History  Problem Relation Age of Onset   Stroke Mother    Hypertension Mother    Dementia Mother    Alzheimer's disease Mother    Gout Father    Asthma Father    Hypertension Father    Dementia Father    Healthy Sister    Stroke Brother    Alzheimer's disease Brother    Healthy Daughter    Hypertension Brother    Healthy Brother    Cancer Paternal 24    Healthy Sister    Healthy Sister     Hypertension Sister    Hypertension Sister    Stroke Brother    Alzheimer's disease Brother    Stroke Brother    Hypertension Brother    Stroke Brother    Hypertension Brother    Healthy Brother    Healthy Brother    Hypertension Daughter    Breast cancer Neg Hx      Current Outpatient Medications:    acetaminophen (TYLENOL) 500 MG tablet, Take 2 tablets (1,000 mg total) by mouth every 6 (six) hours as needed for mild pain or moderate pain., Disp: 30 tablet, Rfl: 0   albuterol (VENTOLIN HFA) 108 (90 Base) MCG/ACT inhaler, Inhale 2 puffs into the lungs every 4 (four) hours as needed for wheezing or shortness of breath., Disp: 18 g, Rfl: 0   escitalopram (LEXAPRO) 10 MG tablet, Take 1 tablet by mouth daily., Disp: 90 tablet, Rfl: 1   ezetimibe (ZETIA) 10 MG tablet, Take 1 tablet by mouth daily., Disp: 90 tablet, Rfl: 0   furosemide (LASIX) 40 MG tablet, Take 1 tablet by mouth daily., Disp: 30 tablet, Rfl: 0   ketoconazole (NIZORAL) 2 % cream, Apply 1 application topically daily as needed for irritation. On abdominal fold prn, Disp: 60 g, Rfl: 0   levocetirizine (XYZAL) 5 MG tablet, Take 1 tablet by mouth every evening., Disp: 90 tablet, Rfl: 3   olmesartan-hydrochlorothiazide (BENICAR HCT) 20-12.5 MG tablet, Take 1 tablet by mouth daily. **Please schedule appointment for further refills.**, Disp: 90 tablet, Rfl: 0   omeprazole (PRILOSEC) 10 MG capsule, Take 10 mg by mouth daily., Disp: , Rfl:    pantoprazole (PROTONIX) 20 MG tablet, Take 1 tablet by mouth daily., Disp: 90 tablet, Rfl: 3   potassium chloride SA (KLOR-CON) 20 MEQ tablet, Take 1 tablet by mouth daily., Disp: 90 tablet, Rfl: 3   pregabalin (LYRICA) 50 MG capsule, Take 1 capsule by mouth every morning, and, take 2 capsules every night., Disp: 90 capsule, Rfl: 2  Physical exam:  Vitals:   07/13/20 1052  BP: (!) 130/53  Pulse: (!) 53  Resp: 16  Temp: 97.7 F (36.5 C)  TempSrc: Tympanic  Weight: 237  lb (107.5 kg)  Height: _0  (1.499 m)   Physical Exam Constitutional:      General: She is not in acute distress.    Appearance: She is obese.  Cardiovascular:     Rate and Rhythm: Normal rate and regular rhythm.     Heart sounds: Normal heart sounds.  Pulmonary:     Effort: Pulmonary effort is normal.     Breath sounds: Normal breath sounds.  Abdominal:     General: Bowel sounds are normal. There is no distension.     Palpations: Abdomen is soft.     Tenderness: There is no abdominal tenderness.  Skin:    General:  Skin is warm and dry.  Neurological:     Mental Status: She is alert and oriented to person, place, and time.      CMP Latest Ref Rng & Units 07/13/2020  Glucose 70 - 99 mg/dL 113(H)  BUN 8 - 23 mg/dL 17  Creatinine 0.44 - 1.00 mg/dL 0.69  Sodium 135 - 145 mmol/L 140  Potassium 3.5 - 5.1 mmol/L 3.8  Chloride 98 - 111 mmol/L 100  CO2 22 - 32 mmol/L 31  Calcium 8.9 - 10.3 mg/dL 8.9  Total Protein 6.5 - 8.1 g/dL 6.8  Total Bilirubin 0.3 - 1.2 mg/dL 0.5  Alkaline Phos 38 - 126 U/L 72  AST 15 - 41 U/L 17  ALT 0 - 44 U/L 13   CBC Latest Ref Rng & Units 07/13/2020  WBC 4.0 - 10.5 K/uL 4.7  Hemoglobin 12.0 - 15.0 g/dL 11.2(L)  Hematocrit 36 - 46 % 33.7(L)  Platelets 150 - 400 K/uL 265    No images are attached to the encounter.  CT Chest W Contrast  Result Date: 07/05/2020 CLINICAL DATA:  Uterine/cervical cancer.  Restaging. EXAM: CT CHEST, ABDOMEN, AND PELVIS WITH CONTRAST TECHNIQUE: Multidetector CT imaging of the chest, abdomen and pelvis was performed following the standard protocol during bolus administration of intravenous contrast. CONTRAST:  15m OMNIPAQUE IOHEXOL 300 MG/ML  SOLN COMPARISON:  10/05/2019 FINDINGS: CT CHEST FINDINGS Cardiovascular: The heart size is normal. No substantial pericardial effusion. Coronary artery calcification is evident. Atherosclerotic calcification is noted in the wall of the thoracic aorta. Mediastinum/Nodes: No mediastinal  lymphadenopathy. There is no hilar lymphadenopathy. The esophagus has normal imaging features. There is no axillary lymphadenopathy. Lungs/Pleura: Stable 2 mm left upper lobe pulmonary nodule (77/3). No suspicious pulmonary nodule or mass. No focal airspace consolidation. No pleural effusion. Musculoskeletal: No worrisome lytic or sclerotic osseous abnormality. CT ABDOMEN PELVIS FINDINGS Hepatobiliary: No suspicious focal abnormality within the liver parenchyma. Gallbladder surgically absent. No intrahepatic or extrahepatic biliary dilation. Pancreas: No focal mass lesion. No dilatation of the main duct. No intraparenchymal cyst. No peripancreatic edema. Spleen: No splenomegaly. No focal mass lesion. Adrenals/Urinary Tract: No adrenal nodule or mass. Cortical scarring noted lower pole left kidney. Kidneys otherwise unremarkable. No evidence for hydroureter. The urinary bladder appears normal for the degree of distention. Stomach/Bowel: Stomach is unremarkable. No gastric wall thickening. No evidence of outlet obstruction. Duodenum is normally positioned as is the ligament of Treitz. No small bowel wall thickening. No small bowel dilatation. The terminal ileum is normal. The appendix is not visualized, but there is no edema or inflammation in the region of the cecum. No gross colonic mass. No colonic wall thickening. Vascular/Lymphatic: There is abdominal aortic atherosclerosis without aneurysm. No gastrohepatic or hepatoduodenal ligament lymphadenopathy. No para-aortic lymphadenopathy. Previously described 6 mm external iliac node is stable at 6 mm today (98/2). Left operator node measured previously at 6 mm is stable at 6 mm today (95/2). Reproductive: The uterus is surgically absent. Stable 2.0 x 2.0 cm low-attenuation soft tissue focus along the right external iliac vessels has been previously described as right adnexa/ovary and is best appreciated on coronal 104/4. The dominant follicle seen in the high left  ovary on the previous study is no longer evident. Other: No intraperitoneal free fluid. A subtle 10 mm nodule is identified in the left pelvis just anterior to the junction of the descending and sigmoid colon (image 95/2). The subtle area of soft tissue thickening in the midline omentum seen previously has resolved in  the interval. Musculoskeletal: No worrisome lytic or sclerotic osseous abnormality. IMPRESSION: 1. Subtle new 10 mm nodule in the left pelvis just anterior to the junction of the descending and sigmoid colon. This may be related to prior chronic inflammation, but close attention on follow-up recommended as metastatic disease cannot be excluded. Follow-up CT in 3 months may be warranted. 2. The subtle area of soft tissue thickening in the midline omentum seen previously has resolved in the interval. 3. Stable appearance of small bilateral pelvic sidewall lymph nodes. 4. No change in the previously described 2 mm left upper lobe pulmonary nodule. 5. Aortic Atherosclerosis (ICD10-I70.0). Electronically Signed   By: Misty Stanley M.D.   On: 07/05/2020 14:24   CT Abdomen Pelvis W Contrast  Result Date: 07/05/2020 CLINICAL DATA:  Uterine/cervical cancer.  Restaging. EXAM: CT CHEST, ABDOMEN, AND PELVIS WITH CONTRAST TECHNIQUE: Multidetector CT imaging of the chest, abdomen and pelvis was performed following the standard protocol during bolus administration of intravenous contrast. CONTRAST:  151m OMNIPAQUE IOHEXOL 300 MG/ML  SOLN COMPARISON:  10/05/2019 FINDINGS: CT CHEST FINDINGS Cardiovascular: The heart size is normal. No substantial pericardial effusion. Coronary artery calcification is evident. Atherosclerotic calcification is noted in the wall of the thoracic aorta. Mediastinum/Nodes: No mediastinal lymphadenopathy. There is no hilar lymphadenopathy. The esophagus has normal imaging features. There is no axillary lymphadenopathy. Lungs/Pleura: Stable 2 mm left upper lobe pulmonary nodule (77/3). No  suspicious pulmonary nodule or mass. No focal airspace consolidation. No pleural effusion. Musculoskeletal: No worrisome lytic or sclerotic osseous abnormality. CT ABDOMEN PELVIS FINDINGS Hepatobiliary: No suspicious focal abnormality within the liver parenchyma. Gallbladder surgically absent. No intrahepatic or extrahepatic biliary dilation. Pancreas: No focal mass lesion. No dilatation of the main duct. No intraparenchymal cyst. No peripancreatic edema. Spleen: No splenomegaly. No focal mass lesion. Adrenals/Urinary Tract: No adrenal nodule or mass. Cortical scarring noted lower pole left kidney. Kidneys otherwise unremarkable. No evidence for hydroureter. The urinary bladder appears normal for the degree of distention. Stomach/Bowel: Stomach is unremarkable. No gastric wall thickening. No evidence of outlet obstruction. Duodenum is normally positioned as is the ligament of Treitz. No small bowel wall thickening. No small bowel dilatation. The terminal ileum is normal. The appendix is not visualized, but there is no edema or inflammation in the region of the cecum. No gross colonic mass. No colonic wall thickening. Vascular/Lymphatic: There is abdominal aortic atherosclerosis without aneurysm. No gastrohepatic or hepatoduodenal ligament lymphadenopathy. No para-aortic lymphadenopathy. Previously described 6 mm external iliac node is stable at 6 mm today (98/2). Left operator node measured previously at 6 mm is stable at 6 mm today (95/2). Reproductive: The uterus is surgically absent. Stable 2.0 x 2.0 cm low-attenuation soft tissue focus along the right external iliac vessels has been previously described as right adnexa/ovary and is best appreciated on coronal 104/4. The dominant follicle seen in the high left ovary on the previous study is no longer evident. Other: No intraperitoneal free fluid. A subtle 10 mm nodule is identified in the left pelvis just anterior to the junction of the descending and sigmoid  colon (image 95/2). The subtle area of soft tissue thickening in the midline omentum seen previously has resolved in the interval. Musculoskeletal: No worrisome lytic or sclerotic osseous abnormality. IMPRESSION: 1. Subtle new 10 mm nodule in the left pelvis just anterior to the junction of the descending and sigmoid colon. This may be related to prior chronic inflammation, but close attention on follow-up recommended as metastatic disease cannot be excluded. Follow-up  CT in 3 months may be warranted. 2. The subtle area of soft tissue thickening in the midline omentum seen previously has resolved in the interval. 3. Stable appearance of small bilateral pelvic sidewall lymph nodes. 4. No change in the previously described 2 mm left upper lobe pulmonary nodule. 5. Aortic Atherosclerosis (ICD10-I70.0). Electronically Signed   By: Misty Stanley M.D.   On: 07/05/2020 14:24     Assessment and plan- Patient is a 69 y.o. female with stage IIIc grade 2 endometrial carcinoma with bilateral positive pelvic sentinel lymph node s/p TLH/BSO with pelvic sentinel lymph node biopsies in October 2019 followed by adjuvant chemotherapy and radiation treatment.    She is here for routine follow-up of endometrial cancer  CA-125 from today is pending.  Clinically she is doing well with no concerning symptoms of recurrence based on today's exam.She did have a CT chest abdomen and pelvis with contrast which was ordered by Dr. Donella Stade on 07/05/2020 which incidentally showed a 10 mm nodule in the left pelvis which may be related to prior inflammation.  Repeat CT scan was recommended in 3 months.  She does have a repeat scan scheduled for in November.  Patient also continues to follow-up with GYN oncology.  I will see her back in 6 months with CBC with differential CMP and CA-125.  I have again encouraged the patient to take her Covid vaccine.    Visit Diagnosis 1. Anemia of chronic disease   2. Encounter for follow-up surveillance  of endometrial cancer      Dr. Randa Evens, MD, MPH [facility] at [facility] [phone] 07/13/2020 12:38 PM

## 2020-07-13 NOTE — Progress Notes (Signed)
Radiation Oncology Follow up Note  Name: Molly [name]   Date:   07/13/2020 MRN:  [id number] DOB: 08/14/1951    This 69 y.o. female presents to the clinic today for close to 1 year follow-up status post both pelvic and periaortic radiation therapy for stage IIIc endometrial carcinoma.  REFERRING PROVIDER: Steele Sizer, MD  HPI: Patient is a 69 year old female now out close to a year having completed adjuvant external beam radiation therapy to both her pelvic and periaortic lymph nodes for stage IIIc endometrial carcinoma.  She is doing well specifically denies any increased lower urinary tract symptoms diarrhea fatigue.Marland Kitchen  Her initial TAH/BSO showed a FIGO grade 2 adenocarcinoma with 33 percent myometrial invasion lymphatic invasion present 2 out of 3 sentinel lymph nodes were positive for macro metastatic disease she was a T1 a N1 a FIGO 3C disease.  Her most recent CT scan which I have reviewed shows a subtle 10 mm nodule in the left pelvis anterior to the junction of descending and sigmoid colon may be related to chronic inflammation.  We we will continue to monitor that.  An area of soft tissue thickening the midline omentum has resolved.  No other evidence of progressive or recurrent disease is noted.  COMPLICATIONS OF TREATMENT: none  FOLLOW UP COMPLIANCE: keeps appointments   PHYSICAL EXAM:  BP (!) 148/68   Pulse (!) 58   Temp 98.6 F (37 C)   Resp 20   Wt 237 lb 1.6 oz (107.5 kg)   SpO2 98%   BMI 47.89 kg/m  Obese female in NAD.  Well-developed well-nourished patient in NAD. HEENT reveals PERLA, EOMI, discs not visualized.  Oral cavity is clear. No oral mucosal lesions are identified. Neck is clear without evidence of cervical or supraclavicular adenopathy. Lungs are clear to A&P. Cardiac examination is essentially unremarkable with regular rate and rhythm without murmur rub or thrill. Abdomen is benign with no organomegaly or masses noted. Motor sensory and DTR levels are equal  and symmetric in the upper and lower extremities. Cranial nerves II through XII are grossly intact. Proprioception is intact. No peripheral adenopathy or edema is identified. No motor or sensory levels are noted. Crude visual fields are within normal range.  RADIOLOGY RESULTS: CT scans reviewed compatible with above-stated findings  PLAN: At this time patient is clinically doing well.  I have ordered a 9-month follow-up with a CT scan to monitor this new subtle 10 mm lymph node in her pelvis.  This was not an area of previous radiation.  I believe her possibly dealing with an inflammatory node.  Should this show growth at 4 months we will order a PET CT scan for better delineation.  Patient comprehends my recommendations well.  Appointments were made.  I would like to take this opportunity to thank you for allowing me to participate in the care of your patient.Noreene Filbert, MD

## 2020-07-13 NOTE — Progress Notes (Signed)
Pt still has numbness on finger tips and she has numbness under her toes. She states the pain is better so she feels like the medication is working

## 2020-07-14 LAB — CA 125: Cancer Antigen (CA) 125: 10.1 U/mL (ref 0.0–38.1)

## 2020-07-18 ENCOUNTER — Inpatient Hospital Stay: Payer: Medicare Other

## 2020-07-24 NOTE — Progress Notes (Deleted)
 Gynecology Oncology Progress Note [facility]  [phone] [phone]  Patient Care Team: Sowles, Krichna, MD as PCP - General (Family Medicine) Cherry, Anika, MD as Consulting Physician (Obstetrics and Gynecology) Stanton, Kristi D, RN as Registered Nurse Chrystal, Glenn, MD as Referring Physician (Radiation Oncology) Rao, Archana C, MD as Consulting Physician (Oncology) Berchuck, Andrew, MD as Referring Physician (Obstetrics)   Name of the patient: Molly [name]  [id number]  10/24/1951   Date of visit: 07/25/2020  Diagnosis: 1. Endometrial adenocarcinoma (HCC)   Gynecologic Oncology Interval Visit   Referring Provider: Dr. Cherry  Chief Complaint: FIGO Stage IIIc1 grade 2 endometrioid endometrial cancer.  Subjective:  Molly [name] is a 69 y.o. female, initially seen in consultation for Dr. Cherry, grade 2 endometrioid endometrial cancer, s/p TLH-BSO with SLN mapping and biopsies on 10/06/18 with Dr. Berchuck and Dr. Cherry at [facility], followed by chemotherapy and radiation with vaginal cuff boost, who returns to clinic today for follow-up. She was last seen in clinic 10/19/2019.   She completed 6 cycles of carbotaxol and February 2020 and pelvic radiation in June 2020.  07/05/2020 CT ABD/PEL IMPRESSION: 1. Subtle new 10 mm nodule in the left pelvis just anterior to the junction of the descending and sigmoid colon. This may be related to prior chronic inflammation, but close attention on follow-up  recommended as metastatic disease cannot be excluded. Follow-up CT in 3 months may be warranted. 2. The subtle area of soft tissue thickening in the midline omentum seen previously has resolved in the interval. 3. Stable appearance of small bilateral pelvic sidewall lymph nodes. 4. No change in the previously described 2 mm left upper lobe pulmonary nodule. 5. Aortic Atherosclerosis (ICD10-I70.0).  07/06/2020 Symptom Management  Clinic She saw Jennifer Burns in clinic on 07/06/2020 with co mplaints related to dehydration secondary to nausea and vomiting x3-4 days. She was brought into the clinic for assessment of CBC/CMP, and given IV fluids.   07/13/2020 Visit with Dr. Rao (Medical Oncology)  She suggested continue seeing patient every 6 months with CBC/CMP/CA-125 at this time, and CT-ABD/PEL in 3 months as suggested by Radiology secondary to 10mm nodule present in the left pelvis.   Today she***  Gynecologic Oncology History:  She has history of Grade 3 uterine prolapse with Grade 1 cystocele and rectocele. She noted pessary had become dislodged after coughing d/t pneumonia and presented to Dr. Cherry for evaluation 05/2018. At that time she noted post-menopausal bleeding and cramping and has history of cervical polyps. Bleeding was felt to be possibly related to pessary.   Ultrasound revealed: uterus measuring 10.2 x 5.5 x 5.7 cm, heterogenous with evidence of fibroids invading endometrium measuring 2.9 x 3.3 x 3.6 cm and two additional fibroids measuring 2.0 x 2.1 x 1.8, and 2.9 x 3.3 x 3.6. Endometrium measuring 4.7 mm. Neither ovary was visualized.   She had second episode of bleeding 08/31/18-09/04/18, felt to be heavier and more 'period like'. Endometrial biopsy was performed. Pathology: - Endometrioid adenocarcinoma, figo grade 1.   Pathology:  DIAGNOSIS:  A. UTERUS WITH CERVIX AND BILATERAL FALLOPIAN TUBES AND OVARIES; TOTAL HYSTERECTOMY WITH BILATERAL SALPINGO-OOPHORECTOMY:  - ENDOMETRIAL ADENOCARCINOMA.  - SEE CANCER SUMMARY BELOW.  - CERVIX WITH NABOTHIAN CYSTS, CHRONIC CERVICITIS WITH SQUAMOUS METAPLASIA, AND 0.9 CM ENDOCERVICAL POLYP.  - INACTIVE BACKGROUND ENDOMETRIUM WITH ENDOMETRIAL POLYP.  - MYOMETRIUM WITH ADENOMYOSIS AND LEIOMYOMATA, LARGEST MEASURING 4.5 CM.  - UNREMARKABLE FALLOPIAN TUBES AND OVARIES.   B. SENTINEL LYMPH NODE, LEFT   OBTURATOR; EXCISION:  - METASTATIC CARCINOMA INVOLVES ONE LYMPH  NODE (1/1).   C. SENTINEL LYMPH NODE, LEFT EXTERNAL ILIAC; EXCISION:  - LYMPHOID TISSUE NOT PRESENT.  - NEGATIVE FOR MALIGNANCY.   D. SENTINEL LYMPH NODE, RIGHT EXTERNAL ILIAC; EXCISION:  - METASTATIC CARCINOMA INVOLVES ONE OF TWO LYMPH NODES (1/2).   LVSI present and atypical washings.  Invasion 6/18 and cervix and adnexa negative.   MLH1: LOSS of protein expression  MSH2: Intact nuclear expression  MSH6: Intact nuclear expression  PMS2: LOSS of protein expression   MLH1- positive methylation HER-2 negative  Pathologic Stage: FIGO stage IIIC1 grade 2  PET CT scan showed metastatic adenopathy and retroperitoneal and external iliac group of lymph nodes in the right side. No evidence of metastatic disease elsewhere.  She received 6 cycles of carbo-taxol on 11/05/2018-02/25/2019. She completed radiation to pelvic and and periaortic nodes 03-31-2019-05/06/2019 with boost to vaginal cuff after 3rd cycle of chemotherapy. She completed on 06/15/2019.   She recovered from Covid October 2020. Mainly had a cough.  IV portacath removed 10/17/2019.  She has neuropathy of feet and toes which she is seeking acupuncture for. Dr Rao has prescribed Lyrica and this helps. She is fully ambulatory.    Problem List: Patient Active Problem List   Diagnosis Date Noted  . 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 Immunosuppressed status (HCC) 01/11/2019  . Iron deficiency anemia 11/15/2018  . Endometrial adenocarcinoma (HCC) 10/29/2018  . Umbilical hernia without obstruction and without gangrene   . Atherosclerosis of right coronary artery 11/10/2017  . Atherosclerosis of abdominal aorta (HCC) 11/10/2017  . Prediabetes 10/02/2017  . Pain in limb 03/31/2017  . Perennial allergic rhinitis with seasonal variation 05/14/2016  . Asthma, mild intermittent, well-controlled 05/14/2016  . Hypertension goal BP (blood pressure) < 140/90 12/10/2015  .  Cardiomyopathy due to hypertension (HCC) 12/10/2015  . History of CVA (cerebrovascular accident) 12/10/2015  . Hyperlipidemia LDL goal <70 12/10/2015  . Statin intolerance 12/10/2015    Past Medical History: Past Medical History:  Diagnosis Date  . Anxiety   . Asthma    history of asthma  . Cardiomyopathy (HCC)   . Complication of anesthesia    tore hair out and made teeth rough  . COVID-19 virus infection   . Depression   . Endometrial cancer (HCC) 08/2018  . GERD (gastroesophageal reflux disease)    takes prilosec prn  . History of CVA (cerebrovascular accident) 12/10/2015  . Hyperlipidemia LDL goal <70 12/10/2015  . Hypertension   . Prediabetes 10/02/2017   A1c 6 in January 2018  . Stroke (HCC) 1990    no residual effects    Past Surgical History: Past Surgical History:  Procedure Laterality Date  . ABDOMINAL HYSTERECTOMY    . CHOLECYSTECTOMY    . COLONOSCOPY WITH PROPOFOL N/A 01/08/2017   Procedure: COLONOSCOPY WITH PROPOFOL;  Surgeon: Kiran Anna, MD;  Location: [facility] ENDOSCOPY;  Service: Endoscopy;  Laterality: N/A;  . COLONOSCOPY WITH PROPOFOL N/A 05/05/2018   Procedure: COLONOSCOPY WITH PROPOFOL;  Surgeon: Anna, Kiran, MD;  Location: [facility] ENDOSCOPY;  Service: Gastroenterology;  Laterality: N/A;  . HERNIA REPAIR  10/2017   umbilical  . PORTACATH PLACEMENT N/A 11/04/2018   Procedure: INSERTION PORT-A-CATH;  Surgeon: Pabon, Diego F, MD;  Location: [facility] ORS;  Service: General;  Laterality: N/A;  . SENTINEL NODE BIOPSY N/A 10/06/2018   Procedure: SENTINEL NODE BIOPSY;  Surgeon: Berchuck, Andrew, MD;  Location: [facility] ORS;    Service: Gynecology;  Laterality: N/A;  . UMBILICAL HERNIA REPAIR N/A 11/17/2017   Procedure: HERNIA REPAIR UMBILICAL ADULT;  Surgeon: Jules Husbands, MD;  Location: [facility] ORS;  Service: General;  Laterality: N/A;   Past Gynecologic History:  [id number] Denies abnormal pap smears.  History of OCP use.  Denies STD history  OB History:  OB History  Gravida  Para Term Preterm AB Living  _0 SAB TAB Ectopic Multiple Live Births          2    # Outcome Date GA Lbr Len/2nd Weight Sex Delivery Anes PTL Lv  2 Term 38    F Vag-Spont   LIV  1 Term 67    F Vag-Spont   LIV    Family History: Family History  Problem Relation Age of Onset  . Stroke Mother   . Hypertension Mother   . Dementia Mother   . Alzheimer's disease Mother   . Gout Father   . Asthma Father   . Hypertension Father   . Dementia Father   . Healthy Sister   . Stroke Brother   . Alzheimer's disease Brother   . Healthy Daughter   . Hypertension Brother   . Healthy Brother   . Cancer Paternal Grandmother   . Healthy Sister   . Healthy Sister   . Hypertension Sister   . Hypertension Sister   . Stroke Brother   . Alzheimer's disease Brother   . Stroke Brother   . Hypertension Brother   . Stroke Brother   . Hypertension Brother   . Healthy Brother   . Healthy Brother   . Hypertension Daughter   . Breast cancer Neg Hx    Social History: Social History   Socioeconomic History  . Marital status: Divorced    Spouse name: Not on file  . Number of children: 2  . Years of education: some college  . Highest education level: 12th grade  Occupational History  . 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 Occupation: currently a Careers adviser  . Financial resource strain: Not hard at all  . Food insecurity    Worry: Never true    Inability: Never true  . Transportation needs    Medical: No    Non-medical: No  Tobacco Use  . Smoking status: Never Smoker  . Smokeless tobacco: Never Used  . Tobacco comment: smoking cessation materials not required  Substance and Sexual Activity  . Alcohol use: No    Alcohol/week: 0.0 standard drinks  . Drug use: No  . Sexual activity: Not Currently  Lifestyle  . Physical activity    Days per week: 0 days    Minutes per session: 0 min  . Stress: Not at all  Relationships  . Social  connections    Talks on phone: More than three times a week    Gets together: Three times a week    Attends religious service: More than 4 times per year    Active member of club or organization: Yes    Attends meetings of clubs or organizations: More than 4 times per year    Relationship status: Divorced  . Intimate partner violence    Fear of current or ex partner: No    Emotionally abused: No    Physically abused: No    Forced sexual activity: No  Other Topics Concern  . Not on  file  Social History Narrative  . Not on file    Allergies: Allergies  Allergen Reactions  . Ferumoxytol Anaphylaxis  . Nsaids Hives and Rash  . Statins Other (See Comments)    Joint pains  . Victoza [Liraglutide] Other (See Comments)    pancreatitis  . Oxycodone Nausea And Vomiting  . Crestor [Rosuvastatin Calcium] Rash    Current Medications: Current Outpatient Medications  Medication Sig Dispense Refill  . acetaminophen (TYLENOL) 500 MG tablet Take 2 tablets (1,000 mg total) by mouth every 6 (six) hours as needed for mild pain or moderate pain. 30 tablet 0  . albuterol (PROVENTIL HFA;VENTOLIN HFA) 108 (90 Base) MCG/ACT inhaler Inhale 2 puffs into the lungs every 4 (four) hours as needed for wheezing or shortness of breath. 1 Inhaler 0  . benzonatate (TESSALON) 100 MG capsule Take 1-2 capsules (100-200 mg total) by mouth 2 (two) times daily as needed. 40 capsule 0  . escitalopram (LEXAPRO) 10 MG tablet Take 1 tablet (10 mg total) by mouth daily. 30 tablet 2  . ezetimibe (ZETIA) 10 MG tablet Take 1 tablet (10 mg total) by mouth daily. 30 tablet 2  . furosemide (LASIX) 40 MG tablet Take 1 tablet (40 mg total) by mouth daily as needed for fluid. 30 tablet 2  . ketoconazole (NIZORAL) 2 % cream Apply 1 application topically daily as needed for irritation. On abdominal fold prn 60 g 0  . lidocaine-prilocaine (EMLA) cream Apply to affected area once 30 g 3  . loratadine (CLARITIN) 10 MG tablet Take 1  tablet by mouth daily. 30 tablet 4  . magnesium oxide (MAG-OX) 400 (241.3 Mg) MG tablet Take 1 tablet (400 mg total) by mouth daily. For hypomagnesemia 30 tablet 0  . olmesartan-hydrochlorothiazide (BENICAR HCT) 20-12.5 MG tablet Take 1 tablet by mouth daily. 90 tablet 1  . omeprazole (PRILOSEC OTC) 20 MG tablet Take 20 mg by mouth daily as needed.    . Potassium 99 MG TABS Take 1 tablet by mouth daily.    . pregabalin (LYRICA) 75 MG capsule Take 1 capsule by mouth twice daily. 60 capsule 3   No current facility-administered medications for this visit.    Facility-Administered Medications Ordered in Other Visits  Medication Dose Route Frequency Provider Last Rate Last Dose  . sodium chloride flush (NS) 0.9 % injection 10 mL  10 mL Intravenous PRN Rao, Archana C, MD   10 mL at 11/15/18 0915  . sodium chloride flush (NS) 0.9 % injection 10 mL  10 mL Intracatheter Once PRN Rao, Archana C, MD       ***Review of Systems    Objective:  Physical Examination:  ***VS   ECOG Performance Status: 0 - Asymptomatic  EXAM 10/19/2019 GENERAL: Patient is a well appearing female in no acute distress HEENT:  Sclera clear. Anicteric NODES:  Negative axillary, supraclavicular, inguinal lymph node survery Chest: portacath removal incision healing well.  LUNGS:  Clear to auscultation bilaterally.   HEART:  Regular rate and rhythm.  ABDOMEN:  Soft, nontender.  No hernias, incisions well healed. No masses or ascites EXTREMITIES:  No peripheral edema. Atraumatic. No cyanosis SKIN:  Clear with no obvious rashes or skin changes.  NEURO:  Nonfocal. Well oriented.  Appropriate affect.  Pelvic: EGBUS: no lesions Vagina: cuff well healed.  Bimanual/RV: no masses  Assessment:  Jeanenne J Brau is a 69 y.o. female diagnosed with stage IIIC1 grade 2 endometrial cancer with bilateral positive pelvic SLNs with macroscopic tumor s/p TLH/BSO   pelvic SLN biopsies 10/06/18.  Received sandwich therapy with 3 cycles of  carboplatin/taxol, external pelvic radiation with vaginal brachytherapy and then 3 more cycles of chemotherapy completed 6/20.  Normal Exam today.   ***Persistent peripheral neuropathy due to taxol.    ***COVID19 infection 9/20, recovering October 2020.   Tumor has MLH1 loss with promoter methylation. HER-2 negative.   Medical co-morbidities complicating care: Morbid obesity, CVA 1990 with no residual and no medication, HTN with good BP today.    Plan:   Problem List Items Addressed This Visit      Genitourinary   Endometrial adenocarcinoma ([place]) - Primary     Continue follow up with Dr. Janese Banks and Dr. Baruch Gouty.  Dr Janese Banks will continue to manage her neuropathy.  We will see her back in *** months. Provided education to return to clinic with any new or worsening symptoms including ***  Tumor has MLH1 loss with promoter methylation, so she would be a candidate for immune checkpoint inhibitor therapy if she has a recurrence in the future. HER-2 negative.   The patient's diagnosis, an outline of the further diagnostic and laboratory studies which will be required, the recommendation for surgery, and alternatives were discussed with her and her accompanying family members.  All questions were answered to their satisfaction.  Benedetto Goad, Student FNP  I personally had a face to face interaction and evaluated the patient jointly with the NP Student, Mrs. Benedetto Goad.  I have reviewed her history and available records and have performed the key portions of the physical exam including general, HEENT, abdominal exam, pelvic exam with my findings confirming those documented above by the APP student.  I have discussed the case with the APP student and the patient.  I agree with the above documentation, assessment and plan which was fully formulated by me.  Counseling was completed by me.   COSIGNER:   CC:  Steele Sizer, [address],  [place] [address] [phone]

## 2020-07-25 ENCOUNTER — Inpatient Hospital Stay: Payer: Medicare Other

## 2020-07-26 ENCOUNTER — Other Ambulatory Visit: Payer: Self-pay | Admitting: Family Medicine

## 2020-07-26 DIAGNOSIS — T451X5A Adverse effect of antineoplastic and immunosuppressive drugs, initial encounter: Secondary | ICD-10-CM

## 2020-07-26 DIAGNOSIS — G620 Drug-induced polyneuropathy: Secondary | ICD-10-CM

## 2020-07-26 NOTE — Telephone Encounter (Signed)
Requested  medications are  due for refill today yes  Requested medications are on the active medication list yes  Last refill 7/29  Last visit 03/2020  Future visit scheduled 09/2020  Notes to clinic Not Delegated

## 2020-08-29 ENCOUNTER — Other Ambulatory Visit: Payer: Self-pay

## 2020-08-29 ENCOUNTER — Inpatient Hospital Stay: Payer: Medicare Other | Attending: Obstetrics and Gynecology | Admitting: Obstetrics and Gynecology

## 2020-08-29 VITALS — BP 122/78 | HR 54 | Temp 98.1°F | Resp 16 | Ht 59.0 in | Wt 230.8 lb

## 2020-08-29 DIAGNOSIS — Z923 Personal history of irradiation: Secondary | ICD-10-CM | POA: Diagnosis not present

## 2020-08-29 DIAGNOSIS — I2510 Atherosclerotic heart disease of native coronary artery without angina pectoris: Secondary | ICD-10-CM | POA: Insufficient documentation

## 2020-08-29 DIAGNOSIS — Z79899 Other long term (current) drug therapy: Secondary | ICD-10-CM | POA: Insufficient documentation

## 2020-08-29 DIAGNOSIS — Z8616 Personal history of COVID-19: Secondary | ICD-10-CM | POA: Insufficient documentation

## 2020-08-29 DIAGNOSIS — I43 Cardiomyopathy in diseases classified elsewhere: Secondary | ICD-10-CM | POA: Insufficient documentation

## 2020-08-29 DIAGNOSIS — I10 Essential (primary) hypertension: Secondary | ICD-10-CM | POA: Insufficient documentation

## 2020-08-29 DIAGNOSIS — Z90710 Acquired absence of both cervix and uterus: Secondary | ICD-10-CM | POA: Insufficient documentation

## 2020-08-29 DIAGNOSIS — F329 Major depressive disorder, single episode, unspecified: Secondary | ICD-10-CM | POA: Insufficient documentation

## 2020-08-29 DIAGNOSIS — J4520 Mild intermittent asthma, uncomplicated: Secondary | ICD-10-CM | POA: Insufficient documentation

## 2020-08-29 DIAGNOSIS — Z90722 Acquired absence of ovaries, bilateral: Secondary | ICD-10-CM

## 2020-08-29 DIAGNOSIS — Z8673 Personal history of transient ischemic attack (TIA), and cerebral infarction without residual deficits: Secondary | ICD-10-CM | POA: Diagnosis not present

## 2020-08-29 DIAGNOSIS — C541 Malignant neoplasm of endometrium: Secondary | ICD-10-CM | POA: Insufficient documentation

## 2020-08-29 DIAGNOSIS — I700 Atherosclerosis of aorta: Secondary | ICD-10-CM | POA: Insufficient documentation

## 2020-08-29 DIAGNOSIS — F419 Anxiety disorder, unspecified: Secondary | ICD-10-CM | POA: Insufficient documentation

## 2020-08-29 DIAGNOSIS — I119 Hypertensive heart disease without heart failure: Secondary | ICD-10-CM | POA: Diagnosis not present

## 2020-08-29 DIAGNOSIS — K219 Gastro-esophageal reflux disease without esophagitis: Secondary | ICD-10-CM | POA: Diagnosis not present

## 2020-08-29 DIAGNOSIS — E785 Hyperlipidemia, unspecified: Secondary | ICD-10-CM | POA: Diagnosis not present

## 2020-08-29 DIAGNOSIS — K429 Umbilical hernia without obstruction or gangrene: Secondary | ICD-10-CM | POA: Diagnosis not present

## 2020-08-29 DIAGNOSIS — Z9221 Personal history of antineoplastic chemotherapy: Secondary | ICD-10-CM | POA: Diagnosis not present

## 2020-08-29 DIAGNOSIS — G629 Polyneuropathy, unspecified: Secondary | ICD-10-CM | POA: Insufficient documentation

## 2020-08-29 NOTE — Progress Notes (Signed)
Gynecologic Oncology Interval Visit   Referring Provider: Dr. Marcelline Mates  Chief Complaint: FIGO Stage IIIc1 grade 2 endometrioid endometrial cancer  Subjective:  Molly [name] is a 69 y.o. female, initially seen in consultation for Dr. Marcelline Mates, grade 2 endometrioid endometrial cancer, s/p TLH-BSO with SLN mapping and biopsies on 10/06/18 with Dr. Fransisca Connors and Dr. Marcelline Mates at [facility], followed by chemotherapy and radiation with vaginal cuff boost, who returns to clinic today for continued surveillance.   She had a covid infection in September 2020. Port has been removed.   CA 125 has been followed by Dr. Janese Banks.  02/04/2019 11.4 07/18/2019 10.2 10/05/2019 9.6 07/13/2020 10.1  She continues lyrica and neuropathy in extremities much better.    Gynecologic Oncology History:  She has history of Grade 3 uterine prolapse with Grade 1 cystocele and rectocele. She noted pessary had become dislodged after coughing d/t pneumonia and presented to Dr. Marcelline Mates for evaluation 05/2018. At that time she noted post-menopausal bleeding and cramping and has history of cervical polyps. Bleeding was felt to be possibly related to pessary.   Ultrasound revealed: uterus measuring 10.2 x 5.5 x 5.7 cm, heterogenous with evidence of fibroids invading endometrium measuring 2.9 x 3.3 x 3.6 cm and two additional fibroids measuring 2.0 x 2.1 x 1.8, and 2.9 x 3.3 x 3.6. Endometrium measuring 4.7 mm. Neither ovary was visualized.   She had second episode of bleeding 08/31/18-09/04/18, felt to be heavier and more 'period like'. Endometrial biopsy was performed. Pathology: - Endometrioid adenocarcinoma, figo grade 1.   Pathology:  DIAGNOSIS:  A. UTERUS WITH CERVIX AND BILATERAL FALLOPIAN TUBES AND OVARIES; TOTAL HYSTERECTOMY WITH BILATERAL SALPINGO-OOPHORECTOMY:  - ENDOMETRIAL ADENOCARCINOMA.  - SEE CANCER SUMMARY BELOW.  - CERVIX WITH NABOTHIAN CYSTS, CHRONIC CERVICITIS WITH SQUAMOUS METAPLASIA, AND 0.9 CM ENDOCERVICAL POLYP.  - INACTIVE  BACKGROUND ENDOMETRIUM WITH ENDOMETRIAL POLYP.  - MYOMETRIUM WITH ADENOMYOSIS AND LEIOMYOMATA, LARGEST MEASURING 4.5 CM.  - UNREMARKABLE FALLOPIAN TUBES AND OVARIES.   B. SENTINEL LYMPH NODE, LEFT OBTURATOR; EXCISION:  - METASTATIC CARCINOMA INVOLVES ONE LYMPH NODE (1/1).   C. SENTINEL LYMPH NODE, LEFT EXTERNAL ILIAC; EXCISION:  - LYMPHOID TISSUE NOT PRESENT.  - NEGATIVE FOR MALIGNANCY.   D. SENTINEL LYMPH NODE, RIGHT EXTERNAL ILIAC; EXCISION:  - METASTATIC CARCINOMA INVOLVES ONE OF TWO LYMPH NODES (1/2).   LVSI present and atypical washings.  Invasion 6/18 and cervix and adnexa negative.   MLH1: LOSS of protein expression  MSH2: Intact nuclear expression  MSH6: Intact nuclear expression  PMS2: LOSS of protein expression   MLH1- positive methylation HER-2 negative  Pathologic Stage: FIGO stage IIIC1 grade 2  PET CT scan showed metastatic adenopathy and retroperitoneal and external iliac group of lymph nodes in the right side. No evidence of metastatic disease elsewhere.  She received 6 cycles of carbo-taxol on 11/05/2018-02/25/2019. She completed radiation to pelvic and and periaortic nodes 03-31-2019-05/06/2019 with boost to vaginal cuff after 3rd cycle of chemotherapy. She completed on 06/15/2019.    Problem List: Patient Active Problem List   Diagnosis Date Noted  . 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 Immunosuppressed status ([place]) 01/11/2019  . Iron deficiency anemia 11/15/2018  . Endometrial adenocarcinoma ([place]) 10/29/2018  . Umbilical hernia without obstruction and without gangrene   . Atherosclerosis of right coronary artery 11/10/2017  . Atherosclerosis of abdominal aorta ([place]) 11/10/2017  . Prediabetes 10/02/2017  . Pain in limb 03/31/2017  . Perennial allergic rhinitis with seasonal variation 05/14/2016  .  Asthma, mild intermittent, well-controlled 05/14/2016  . Hypertension goal BP (blood pressure) <  140/90 12/10/2015  . Cardiomyopathy due to hypertension ([place]) 12/10/2015  . History of CVA (cerebrovascular accident) 12/10/2015  . Hyperlipidemia LDL goal <70 12/10/2015  . Statin intolerance 12/10/2015    Past Medical History: Past Medical History:  Diagnosis Date  . Anxiety   . Asthma    history of asthma  . Cardiomyopathy ([place])   . Complication of anesthesia    tore hair out and made teeth rough  . COVID-19 virus infection 09/08/2019  . Depression   . Endometrial cancer ([place]) 08/2018  . GERD (gastroesophageal reflux disease)    takes prilosec prn  . History of CVA (cerebrovascular accident) 12/10/2015  . Hyperlipidemia LDL goal <70 12/10/2015  . Hypertension   . Prediabetes 10/02/2017   A1c 6 in January 2018  . Stroke [facility]) 1990    no residual effects    Past Surgical History: Past Surgical History:  Procedure Laterality Date  . ABDOMINAL HYSTERECTOMY    . CHOLECYSTECTOMY    . COLONOSCOPY WITH PROPOFOL N/A 01/08/2017   Procedure: COLONOSCOPY WITH PROPOFOL;  Surgeon: [name], MD;  Location: [facility] ENDOSCOPY;  Service: Endoscopy;  Laterality: N/A;  . COLONOSCOPY WITH PROPOFOL N/A 05/05/2018   Procedure: COLONOSCOPY WITH PROPOFOL;  Surgeon: [name], MD;  Location: [facility] ENDOSCOPY;  Service: Gastroenterology;  Laterality: N/A;  . HERNIA REPAIR  [id number]   umbilical  . PORTACATH PLACEMENT N/A 11/04/2018   Procedure: INSERTION PORT-A-CATH;  Surgeon: Jules Husbands, MD;  Location: [facility] ORS;  Service: General;  Laterality: N/A;  . SENTINEL NODE BIOPSY N/A 10/06/2018   Procedure: SENTINEL NODE BIOPSY;  Surgeon: Mellody Drown, MD;  Location: [facility] ORS;  Service: Gynecology;  Laterality: N/A;  . UMBILICAL HERNIA REPAIR N/A 11/17/2017   Procedure: HERNIA REPAIR UMBILICAL ADULT;  Surgeon: Jules Husbands, MD;  Location: [facility] ORS;  Service: General;  Laterality: N/A;   Past Gynecologic History:  [id number] Denies abnormal pap smears.  History of OCP use.  Denies STD history  OB  History:  OB History  Gravida Para Term Preterm AB Living  _0 SAB TAB Ectopic Multiple Live Births          2    # Outcome Date GA Lbr Len/2nd Weight Sex Delivery Anes PTL Lv  2 Term 35    F Vag-Spont   LIV  1 Term 51    F Vag-Spont   LIV    Family History: Family History  Problem Relation Age of Onset  . Stroke Mother   . Hypertension Mother   . Dementia Mother   . Alzheimer's disease Mother   . Gout Father   . Asthma Father   . Hypertension Father   . Dementia Father   . Healthy Sister   . Stroke Brother   . Alzheimer's disease Brother   . Healthy Daughter   . Hypertension Brother   . Healthy Brother   . Cancer Paternal Grandmother   . Healthy Sister   . Healthy Sister   . Hypertension Sister   . Hypertension Sister   . Stroke Brother   . Alzheimer's disease Brother   . Stroke Brother   . Hypertension Brother   . Stroke Brother   . Hypertension Brother   . Healthy Brother   . Healthy Brother   . Hypertension Daughter   . Breast cancer Neg Hx    Social History: Social  History   Socioeconomic History  . Marital status: Divorced    Spouse name: Not on file  . Number of children: 2  . Years of education: some college  . Highest education level: 12th grade  Occupational History  . 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 Occupation: currently a Theme park manager  Tobacco Use  . Smoking status: Never Smoker  . Smokeless tobacco: Never Used  . Tobacco comment: smoking cessation materials not required  Vaping Use  . Vaping Use: Never used  Substance and Sexual Activity  . Alcohol use: No    Alcohol/week: 0.0 standard drinks  . Drug use: No  . Sexual activity: Not Currently  Other Topics Concern  . Not on file  Social History Narrative  . Not on file   Social Determinants of Health   Financial Resource Strain: Medium Risk  . Difficulty of Paying Living Expenses: Somewhat hard  Food Insecurity: No Food Insecurity  .  Worried About Charity fundraiser in the Last Year: Never true  . Ran Out of Food in the Last Year: Never true  Transportation Needs: No Transportation Needs  . Lack of Transportation (Medical): No  . Lack of Transportation (Non-Medical): No  Physical Activity: Inactive  . Days of Exercise per Week: 0 days  . Minutes of Exercise per Session: 0 min  Stress: No Stress Concern Present  . Feeling of Stress : Not at all  Social Connections: Moderately Integrated  . Frequency of Communication with Friends and Family: More than three times a week  . Frequency of Social Gatherings with Friends and Family: Three times a week  . Attends Religious Services: More than 4 times per year  . Active Member of Clubs or Organizations: Yes  . Attends Archivist Meetings: More than 4 times per year  . Marital Status: Divorced  Human resources officer Violence: Not At Risk  . Fear of Current or Ex-Partner: No  . Emotionally Abused: No  . Physically Abused: No  . Sexually Abused: No    Allergies: Allergies  Allergen Reactions  . Ferumoxytol Anaphylaxis  . Nsaids Hives and Rash  . Statins Other (See Comments)    Joint pains  . Victoza [Liraglutide] Other (See Comments)    pancreatitis  . Oxycodone Nausea And Vomiting  . Crestor [Rosuvastatin Calcium] Rash    Current Medications: Current Outpatient Medications  Medication Sig Dispense Refill  . albuterol (VENTOLIN HFA) 108 (90 Base) MCG/ACT inhaler Inhale 2 puffs into the lungs every 4 (four) hours as needed for wheezing or shortness of breath. 18 g 0  . escitalopram (LEXAPRO) 10 MG tablet Take 1 tablet by mouth daily. 90 tablet 1  . ezetimibe (ZETIA) 10 MG tablet Take 1 tablet by mouth daily. 90 tablet 0  . furosemide (LASIX) 40 MG tablet Take 1 tablet by mouth daily. 30 tablet 0  . ketoconazole (NIZORAL) 2 % cream Apply 1 application topically daily as needed for irritation. On abdominal fold prn 60 g 0  . levocetirizine (XYZAL) 5 MG tablet  Take 1 tablet by mouth every evening. 90 tablet 3  . olmesartan-hydrochlorothiazide (BENICAR HCT) 20-12.5 MG tablet Take 1 tablet by mouth daily. **Please schedule appointment for further refills.** 90 tablet 0  . pantoprazole (PROTONIX) 20 MG tablet Take 1 tablet by mouth daily. (Patient taking differently: Take 20 mg by mouth daily as needed. ) 90 tablet 3  . potassium chloride SA (KLOR-CON) 20 MEQ tablet  Take 1 tablet by mouth daily. 90 tablet 3  . pregabalin (LYRICA) 50 MG capsule Take 1 capsule by mouth every morning, and, take 2 capsules every night. 90 capsule 1  . acetaminophen (TYLENOL) 500 MG tablet Take 2 tablets (1,000 mg total) by mouth every 6 (six) hours as needed for mild pain or moderate pain. (Patient not taking: Reported on 08/29/2020) 30 tablet 0   No current facility-administered medications for this visit.   Review of Systems General:  no complaints Skin: no complaints Eyes: no complaints HEENT: no complaints Breasts: no complaints Pulmonary: no complaints Cardiac: no complaints Gastrointestinal: no complaints Genitourinary/Sexual: no complaints Ob/Gyn: no complaints Musculoskeletal: no complaints Hematology: no complaints Neurologic/Psych: persistent neuropathic like tingling of hands and feet   Objective:  Physical Examination:  BP 122/78   Pulse (!) 54   Temp 98.1 F (36.7 C) (Oral)   Resp 16   Ht _0  (1.499 m)   Wt 230 lb 12.8 oz (104.7 kg)   BMI 46.62 kg/m     ECOG Performance Status: 0 - Asymptomatic  GENERAL: Patient is a well appearing female in no acute distress HEENT:  Sclera clear. Anicteric NODES:  Negative axillary, supraclavicular, inguinal lymph node survery LUNGS:  Clear to auscultation bilaterally.   HEART:  Regular rate and rhythm.  ABDOMEN:  Soft, nontender.  No hernias, incisions well healed. No masses or ascites EXTREMITIES:  No peripheral edema. Atraumatic. No cyanosis SKIN:  Clear with no obvious rashes or skin changes.   NEURO:  Nonfocal. Well oriented.  Appropriate affect.  Pelvic: chaperoned by nursing EGBUS: no lesions Vagina: cuff well healed.  Bimanual/RV: no masses  Assessment:  LYNSAY FESPERMAN is a 69 y.o. female diagnosed with stage IIIC1 grade 2 endometrial cancer with bilateral positive pelvic SLNs with macroscopic tumor s/p TLH/BSO pelvic SLN biopsies 10/06/18.  Received sandwich therapy with 3 cycles of carboplatin/taxol, external pelvic radiation with vaginal brachytherapy and then 3 more cycles of chemotherapy completed 6/20.  Normal Exam today.   Persistent peripheral neuropathy due to taxol, but better with Lyrica.    COVID19 infection 9/20.  Tumor has MLH1 loss with promoter methylation. HER-2 negative.   Medical co-morbidities complicating care: Morbid obesity, CVA 1990 with no residual and no medication, HTN with good BP today.    Plan:   Problem List Items Addressed This Visit      Genitourinary   Endometrial adenocarcinoma ([place]) - Primary     Continue follow up with Dr. Janese Banks and Dr. Baruch Gouty.  Dr Janese Banks will continue to manage her neuropathy.  We will see her back in 6 months. We will see her back sooner if any symptoms arise.   Tumor has MLH1 loss with promoter methylation, so she would be a candidate for immune checkpoint inhibitor therapy if she has a recurrence in the future. HER-2 negative.   She will get COVID19 vaccine next week.   Mellody Drown, MD  CC:  Steele Sizer, [place] [address] [place],  [place] [address] [phone]

## 2020-08-29 NOTE — Progress Notes (Signed)
Pt is tired all the time. Her air conditioner stopped working and she is hot in her house. She has invitation to go to her sister's home while she is away. She is going to go there at night's. She states that the undertoes bil. Have numbness/tingling and right is better than left.  The finger tips bil. Are sore to touch only.

## 2020-09-25 ENCOUNTER — Other Ambulatory Visit: Payer: Self-pay | Admitting: Family Medicine

## 2020-09-25 DIAGNOSIS — G620 Drug-induced polyneuropathy: Secondary | ICD-10-CM

## 2020-09-25 DIAGNOSIS — I700 Atherosclerosis of aorta: Secondary | ICD-10-CM

## 2020-09-25 DIAGNOSIS — I10 Essential (primary) hypertension: Secondary | ICD-10-CM

## 2020-09-25 NOTE — Telephone Encounter (Signed)
Requested medications are due for refill today? Yes - This medication refill cannot be delegated.    Requested medications are on active medication list?  Yes  Last Refill:   07/26/2020  # 90 with one refill   Future visit scheduled? Yes  Notes to Clinic:  This medication refill cannot be delegated.

## 2020-09-25 NOTE — Telephone Encounter (Signed)
Requested Prescriptions  Pending Prescriptions Disp Refills   ezetimibe (ZETIA) 10 MG tablet [Pharmacy Med Name: Ezetimibe 10mg  Tablet] 90 tablet 0    Sig: Take 1 tablet by mouth daily.     Cardiovascular:  Antilipid - Sterol Transport Inhibitors Failed - 09/25/2020  5:47 AM      Failed - Total Cholesterol in normal range and within 360 days    Cholesterol  Date Value Ref Range Status  09/08/2019 211 (H) 0 - 200 mg/dL Final         Failed - LDL in normal range and within 360 days    LDL Cholesterol (Calc)  Date Value Ref Range Status  09/07/2018 123 (H) mg/dL (calc) Final    Comment:    Reference range: <100 . Desirable range <100 mg/dL for primary prevention;   <70 mg/dL for patients with CHD or diabetic patients  with > or = 2 CHD risk factors. Marland Kitchen LDL-C is now calculated using the Martin-Hopkins  calculation, which is a validated novel method providing  better accuracy than the Friedewald equation in the  estimation of LDL-C.  Cresenciano Genre et al. Annamaria Helling. 8676;195(09): 2061-2068  (http://education.[facility].com/faq/FAQ164)    LDL Cholesterol  Date Value Ref Range Status  09/08/2019 130 (H) 0 - 99 mg/dL Final    Comment:           Total Cholesterol/HDL:CHD Risk Coronary Heart Disease Risk Table                     Men   Women  1/2 Average Risk   3.4   3.3  Average Risk       5.0   4.4  2 X Average Risk   9.6   7.1  3 X Average Risk  23.4   11.0        Use the calculated Patient Ratio above and the CHD Risk Table to determine the patient's CHD Risk.        ATP III CLASSIFICATION (LDL):  <100     mg/dL   Optimal  100-129  mg/dL   Near or Above                    Optimal  130-159  mg/dL   Borderline  160-189  mg/dL   High  >190     mg/dL   Very High Performed at [facility], [address] [place], [place] [address]          Failed - HDL in normal range and within 360 days    HDL  Date Value Ref Range Status  09/08/2019 61 >40 mg/dL Final          Failed - Triglycerides in normal range and within 360 days    Triglycerides  Date Value Ref Range Status  09/08/2019 102 <150 mg/dL Final         Passed - Valid encounter within last 12 months    Recent Outpatient Visits          5 months ago Recurrent falls   [facility] Steele Sizer, MD   1 year ago Cough   [facility] Steele Sizer, MD   1 year ago Mild recurrent major depression [facility])   [facility] Steele Sizer, MD   1 year ago Exposure to [facility] Steele Sizer, MD   1 year ago Mild recurrent major depression ([place])  [facility] Steele Sizer, MD      Future Appointments            In 2 weeks Ancil Boozer, Drue Stager, MD [facility], [facility]   In 7 months  [place]

## 2020-09-25 NOTE — Telephone Encounter (Signed)
Requested Prescriptions  Pending Prescriptions Disp Refills   olmesartan-hydrochlorothiazide (BENICAR HCT) 20-12.5 MG tablet [Pharmacy Med Name: Olmesartan Medoxomil/Hydrochlorothiazide 20mg -12.5mg  Tablet] 90 tablet 0    Sig: Take 1 tablet by mouth daily. **Please schedule appointment for further refills.**     Cardiovascular: ARB + Diuretic Combos Passed - 09/25/2020  5:17 AM      Passed - K in normal range and within 180 days    Potassium  Date Value Ref Range Status  07/13/2020 3.8 3.5 - 5.1 mmol/L Final         Passed - Na in normal range and within 180 days    Sodium  Date Value Ref Range Status  07/13/2020 140 135 - 145 mmol/L Final         Passed - Cr in normal range and within 180 days    Creat  Date Value Ref Range Status  04/13/2020 0.86 0.50 - 0.99 mg/dL Final    Comment:    For patients >57 years of age, the reference limit for Creatinine is approximately 13% higher for people identified as African-American. .    Creatinine, Ser  Date Value Ref Range Status  07/13/2020 0.69 0.44 - 1.00 mg/dL Final         Passed - Ca in normal range and within 180 days    Calcium  Date Value Ref Range Status  07/13/2020 8.9 8.9 - 10.3 mg/dL Final         Passed - Patient is not pregnant      Passed - Last BP in normal range    BP Readings from Last 1 Encounters:  08/29/20 122/78         Passed - Valid encounter within last 6 months    Recent Outpatient Visits          5 months ago Recurrent falls   [facility] Steele Sizer, MD   1 year ago Cough   [facility] Steele Sizer, MD   1 year ago Mild recurrent major depression [facility])   [facility] Steele Sizer, MD   1 year ago Exposure to [facility] Steele Sizer, MD   1 year ago Mild recurrent major depression [facility])   [facility] Steele Sizer, MD      Future Appointments            In  2 weeks Steele Sizer, MD [facility], [place]   In 7 months  [facility], [facility]

## 2020-10-12 NOTE — Progress Notes (Signed)
Name: Molly [name]   MRN: [id number]    DOB: 09-09-1951   Date:10/15/2020       Progress Note  Subjective  Chief Complaint  Follow up   HPI   Asthma: mild intermittent, no cough , sob or wheezingat this time,she has not used albuterol lately. Takes allergies medications   Recurrent Falls:doing better, had PT and feels stronger.   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   Cardiomyopathy without UVO:ZDGUY extremity edema at little worse because she has been standing more and has been taking lasix daily, she denies orthopnea or PND   Allodynia: she has noticed some posterior scalp pain, no rash or drainage, she noticed about one week ago, she always wears turbans , advised to try to avoid pressure to the area and if no improvement we can try nortriptyline to see if symptoms resolves since no signs of infection or masses    Endometrial cancer: s/p hysterectomy,finished the chemo and radiation therapy with Dr. Baruch Gouty , no longer has a port, she had a CT pelvis done that showed a possible new lymphonodi on pelvis, she is going back for repeat CT in November  Morbid obesity: off victoza because of episode of pancreatitis, also has prediabetes,she was on metformin but we stopped medication since diagnosed with endometrial cancer because she had nausea and was losing weight, discussed going back on medication now. She gained from 213 lbs to 236  Lbs today . She states since endometrial cancer her appetite is back , can taste food again. We will recheck A1C today   Dyslipidemia: cannot tolerate statin therapy, but is taking Zetia, reviewed last labs , LDL still above 100, discussed healthy diet   HTN:bp is at goal, no chest pain, dizziness,or palpitation. Continue medication  Atherosclerosis aorta: on zetia, because she cannot tolerate statin therapy, she denies  side effects of medication  GERD: she is currently taking omeprazole and it is controlling symptoms   Patient Active Problem List   Diagnosis Date Noted  . 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 Iron deficiency anemia 11/15/2018  . Endometrial adenocarcinoma ([place]) 10/29/2018  . Umbilical hernia without obstruction and without gangrene   . Atherosclerosis of right coronary artery 11/10/2017  . Atherosclerosis of abdominal aorta ([place]) 11/10/2017  . Prediabetes 10/02/2017  . Pain in limb 03/31/2017  . Perennial allergic rhinitis with seasonal variation 05/14/2016  . Asthma, mild intermittent, well-controlled 05/14/2016  . Hypertension goal BP (blood pressure) < 140/90 12/10/2015  . Cardiomyopathy due to hypertension ([place]) 12/10/2015  . History of CVA (cerebrovascular accident) 12/10/2015  . Hyperlipidemia LDL goal <70 12/10/2015  . Statin intolerance 12/10/2015    Past Surgical History:  Procedure Laterality Date  . ABDOMINAL HYSTERECTOMY    . CHOLECYSTECTOMY    . COLONOSCOPY WITH PROPOFOL N/A 01/08/2017   Procedure: COLONOSCOPY WITH PROPOFOL;  Surgeon: [name], MD;  Location: [facility] ENDOSCOPY;  Service: Endoscopy;  Laterality: N/A;  . COLONOSCOPY WITH PROPOFOL N/A 05/05/2018   Procedure: COLONOSCOPY WITH PROPOFOL;  Surgeon: [name], MD;  Location: [facility] ENDOSCOPY;  Service: Gastroenterology;  Laterality: N/A;  . HERNIA REPAIR  [id number]   umbilical  . PORTACATH PLACEMENT N/A 11/04/2018   Procedure: INSERTION PORT-A-CATH;  Surgeon: Jules Husbands, MD;  Location: [facility] ORS;  Service: General;  Laterality: N/A;  . SENTINEL NODE BIOPSY N/A 10/06/2018  Procedure: SENTINEL NODE BIOPSY;  Surgeon: Mellody Drown, MD;  Location: [facility] ORS;  Service: Gynecology;  Laterality: N/A;  . UMBILICAL HERNIA REPAIR N/A 11/17/2017   Procedure: HERNIA REPAIR UMBILICAL ADULT;  Surgeon: Jules Husbands, MD;  Location: [facility] ORS;  Service: General;   Laterality: N/A;    Family History  Problem Relation Age of Onset  . Stroke Mother   . Hypertension Mother   . Dementia Mother   . Alzheimer's disease Mother   . Gout Father   . Asthma Father   . Hypertension Father   . Dementia Father   . Healthy Sister   . Stroke Brother   . Alzheimer's disease Brother   . Healthy Daughter   . Hypertension Brother   . Healthy Brother   . Cancer Paternal Grandmother   . Healthy Sister   . Healthy Sister   . Hypertension Sister   . Hypertension Sister   . Stroke Brother   . Alzheimer's disease Brother   . Stroke Brother   . Hypertension Brother   . Stroke Brother   . Hypertension Brother   . Healthy Brother   . Healthy Brother   . Hypertension Daughter   . Breast cancer Neg Hx     Social History   Tobacco Use  . Smoking status: Never Smoker  . Smokeless tobacco: Never Used  . Tobacco comment: smoking cessation materials not required  Substance Use Topics  . Alcohol use: No    Alcohol/week: 0.0 standard drinks     Current Outpatient Medications:  .  acetaminophen (TYLENOL) 500 MG tablet, Take 2 tablets (1,000 mg total) by mouth every 6 (six) hours as needed for mild pain or moderate pain., Disp: 30 tablet, Rfl: 0 .  albuterol (VENTOLIN HFA) 108 (90 Base) MCG/ACT inhaler, Inhale 2 puffs into the lungs every 4 (four) hours as needed for wheezing or shortness of breath., Disp: 18 g, Rfl: 0 .  escitalopram (LEXAPRO) 10 MG tablet, Take 1 tablet by mouth daily., Disp: 90 tablet, Rfl: 1 .  ezetimibe (ZETIA) 10 MG tablet, Take 1 tablet (10 mg total) by mouth daily., Disp: 90 tablet, Rfl: 1 .  furosemide (LASIX) 40 MG tablet, Take 1 tablet by mouth daily., Disp: 30 tablet, Rfl: 0 .  ketoconazole (NIZORAL) 2 % cream, Apply 1 application topically daily as needed for irritation. On abdominal fold prn, Disp: 60 g, Rfl: 0 .  levocetirizine (XYZAL) 5 MG tablet, Take 1 tablet by mouth every evening., Disp: 90 tablet, Rfl: 3 .   olmesartan-hydrochlorothiazide (BENICAR HCT) 20-12.5 MG tablet, Take 1 tablet by mouth daily., Disp: 90 tablet, Rfl: 1 .  pantoprazole (PROTONIX) 20 MG tablet, Take 1 tablet by mouth daily. (Patient taking differently: Take 20 mg by mouth daily as needed. ), Disp: 90 tablet, Rfl: 3 .  potassium chloride SA (KLOR-CON) 20 MEQ tablet, Take 1 tablet by mouth daily., Disp: 90 tablet, Rfl: 3 .  pregabalin (LYRICA) 100 MG capsule, Take 1 capsule (100 mg total) by mouth 2 (two) times daily., Disp: 180 capsule, Rfl: 1  Allergies  Allergen Reactions  . Ferumoxytol Anaphylaxis  . Nsaids Hives and Rash  . Statins Other (See Comments)    Joint pains  . Victoza [Liraglutide] Other (See Comments)    pancreatitis  . Oxycodone Nausea And Vomiting  . Crestor [Rosuvastatin Calcium] Rash    I personally reviewed active problem list, medication list, allergies, family history, social history, health maintenance with the patient/caregiver today.   ROS  Constitutional: Negative for fever , positive for  weight change.  Respiratory: Negative for cough and shortness of breath.   Cardiovascular: Negative for chest pain or palpitations.  Gastrointestinal: Negative for abdominal pain, no bowel changes.  Musculoskeletal: Negative for gait problem or joint swelling.  Skin: Negative for rash.  Neurological: Negative for dizziness or headache - but has scalp pain .  No other specific complaints in a complete review of systems (except as listed in HPI above).   Objective  Vitals:   10/15/20 1550  BP: 118/70  Pulse: 68  Resp: 18  Temp: 98.1 F (36.7 C)  TempSrc: Oral  SpO2: 99%  Weight: 236 lb 4.8 oz (107.2 kg)  Height: 4\' 11"  (1.499 m)    Body mass index is 47.73 kg/m.  Physical Exam  Constitutional: Patient appears well-developed and well-nourished. Obese  No distress.  HEENT: head atraumatic, normocephalic, pupils equal and reactive to light,neck supple Cardiovascular: Normal rate, regular  rhythm and normal heart sounds.  No murmur heard. Trace  BLE edema. Pulmonary/Chest: Effort normal and breath sounds normal. No respiratory distress. Abdominal: Soft.  There is no tenderness. Head: no lesions of scalp, tender to touch, but no rashes, seems sensitive  Psychiatric: Patient has a normal mood and affect. behavior is normal. Judgment and thought content normal.  PHQ2/9: Depression screen [facility] 2/9 10/15/2020 05/03/2020 04/13/2020 09/08/2019 08/15/2019  Decreased Interest 0 0 0 0 0  Down, Depressed, Hopeless 0 0 0 0 0  PHQ - 2 Score 0 0 0 0 0  Altered sleeping 0 - 0 0 0  Tired, decreased energy 0 - 0 0 0  Change in appetite 0 - 0 0 0  Feeling bad or failure about yourself  0 - 0 0 0  Trouble concentrating 0 - 0 0 0  Moving slowly or fidgety/restless 0 - 0 0 0  Suicidal thoughts 0 - 0 0 0  PHQ-9 Score 0 - 0 0 0  Difficult doing work/chores - - - - -  Some recent data might be hidden    phq 9 is negative   Fall Risk: Fall Risk  10/15/2020 05/03/2020 04/13/2020 09/08/2019 07/27/2019  Falls in the past year? 0 1 0 0 0  Number falls in past yr: 0 1 1 0 0  Injury with Fall? 0 1 1 0 0  Risk for fall due to : - History of fall(s);Orthopedic patient History of fall(s);Orthopedic patient - -  Risk for fall due to: Comment - - - - -  Follow up - Falls prevention discussed Education provided - Falls evaluation completed     Functional Status Survey: Is the patient deaf or have difficulty hearing?: No Does the patient have difficulty seeing, even when wearing glasses/contacts?: No Does the patient have difficulty concentrating, remembering, or making decisions?: No Does the patient have difficulty walking or climbing stairs?: Yes Does the patient have difficulty dressing or bathing?: No Does the patient have difficulty doing errands alone such as visiting a doctor's office or shopping?: No    Assessment & Plan  1. Atherosclerosis of abdominal aorta (HCC)  - ezetimibe (ZETIA) 10 MG  tablet; Take 1 tablet (10 mg total) by mouth daily.  Dispense: 90 tablet; Refill: 1 - Lipid panel  2. Morbid obesity ([place])  Discussed with the patient the risk posed by an increased BMI. Discussed importance of portion control, calorie counting and at least 150 minutes of physical activity weekly. Avoid sweet beverages and drink more water. Eat at  least 6 servings of fruit and vegetables daily   3. Prediabetes   4. Anemia of chronic disease   5. Statin myopathy    6. Pure hypercholesterolemia  - Lipid panel  7. Hypertension goal BP (blood pressure) < 140/90  - olmesartan-hydrochlorothiazide (BENICAR HCT) 20-12.5 MG tablet; Take 1 tablet by mouth daily.  Dispense: 90 tablet; Refill: 1  8. GERD without esophagitis   9. Cardiomyopathy due to hypertension, without heart failure ([place])   10. Mild recurrent major depression ([place])   11. Bilateral lower extremity edema   12. Hyperglycemia  - Hemoglobin A1c  13. Primary osteoarthritis of both knees   14. History of endometrial cancer   15. Peripheral neuropathy due to chemotherapy (HCC)  - Vitamin B12  16. Other fatigue  - Vitamin B12  17. Need for immunization against influenza  - Flu Vaccine QUAD High Dose(Fluad)  18. Low serum vitamin B12  Check it today

## 2020-10-15 ENCOUNTER — Encounter: Payer: Self-pay | Admitting: Family Medicine

## 2020-10-15 ENCOUNTER — Other Ambulatory Visit: Payer: Self-pay

## 2020-10-15 ENCOUNTER — Ambulatory Visit (INDEPENDENT_AMBULATORY_CARE_PROVIDER_SITE_OTHER): Payer: Medicare Other | Admitting: Family Medicine

## 2020-10-15 VITALS — BP 118/70 | HR 68 | Temp 98.1°F | Resp 18 | Ht 59.0 in | Wt 236.3 lb

## 2020-10-15 DIAGNOSIS — T466X5A Adverse effect of antihyperlipidemic and antiarteriosclerotic drugs, initial encounter: Secondary | ICD-10-CM

## 2020-10-15 DIAGNOSIS — G720 Drug-induced myopathy: Secondary | ICD-10-CM

## 2020-10-15 DIAGNOSIS — E7800 Pure hypercholesterolemia, unspecified: Secondary | ICD-10-CM

## 2020-10-15 DIAGNOSIS — I700 Atherosclerosis of aorta: Secondary | ICD-10-CM

## 2020-10-15 DIAGNOSIS — G620 Drug-induced polyneuropathy: Secondary | ICD-10-CM | POA: Diagnosis not present

## 2020-10-15 DIAGNOSIS — E538 Deficiency of other specified B group vitamins: Secondary | ICD-10-CM

## 2020-10-15 DIAGNOSIS — Z8542 Personal history of malignant neoplasm of other parts of uterus: Secondary | ICD-10-CM

## 2020-10-15 DIAGNOSIS — I119 Hypertensive heart disease without heart failure: Secondary | ICD-10-CM

## 2020-10-15 DIAGNOSIS — Z23 Encounter for immunization: Secondary | ICD-10-CM | POA: Diagnosis not present

## 2020-10-15 DIAGNOSIS — M170 Bilateral primary osteoarthritis of knee: Secondary | ICD-10-CM

## 2020-10-15 DIAGNOSIS — I10 Essential (primary) hypertension: Secondary | ICD-10-CM

## 2020-10-15 DIAGNOSIS — R7303 Prediabetes: Secondary | ICD-10-CM

## 2020-10-15 DIAGNOSIS — T451X5A Adverse effect of antineoplastic and immunosuppressive drugs, initial encounter: Secondary | ICD-10-CM

## 2020-10-15 DIAGNOSIS — K219 Gastro-esophageal reflux disease without esophagitis: Secondary | ICD-10-CM

## 2020-10-15 DIAGNOSIS — R600 Localized edema: Secondary | ICD-10-CM

## 2020-10-15 DIAGNOSIS — I43 Cardiomyopathy in diseases classified elsewhere: Secondary | ICD-10-CM

## 2020-10-15 DIAGNOSIS — D638 Anemia in other chronic diseases classified elsewhere: Secondary | ICD-10-CM

## 2020-10-15 DIAGNOSIS — R5383 Other fatigue: Secondary | ICD-10-CM

## 2020-10-15 DIAGNOSIS — R739 Hyperglycemia, unspecified: Secondary | ICD-10-CM | POA: Diagnosis not present

## 2020-10-15 DIAGNOSIS — F330 Major depressive disorder, recurrent, mild: Secondary | ICD-10-CM

## 2020-10-15 MED ORDER — OLMESARTAN MEDOXOMIL-HCTZ 20-12.5 MG PO TABS: 1.0000 | ORAL_TABLET | Freq: Every day | ORAL | 1 refills | Status: DC

## 2020-10-15 MED ORDER — EZETIMIBE 10 MG PO TABS: 10.0000 mg | ORAL_TABLET | Freq: Every day | ORAL | 1 refills | Status: DC

## 2020-10-15 MED ORDER — PREGABALIN 100 MG PO CAPS: 100.0000 mg | ORAL_CAPSULE | Freq: Two times a day (BID) | ORAL | 1 refills | Status: DC

## 2020-10-16 LAB — LIPID PANEL
Cholesterol: 218 mg/dL — ABNORMAL HIGH (ref <200)
HDL: 63 mg/dL (ref >=50)
LDL Cholesterol (Calc): 130 mg/dL (calc) — ABNORMAL HIGH
Non-HDL Cholesterol (Calc): 155 mg/dL (calc) — ABNORMAL HIGH (ref <130)
Total CHOL/HDL Ratio: 3.5 (calc) (ref <5.0)
Triglycerides: 134 mg/dL (ref <150)

## 2020-10-16 LAB — HEMOGLOBIN A1C
Hgb A1c MFr Bld: 5.9 % of total Hgb — ABNORMAL HIGH (ref <5.7)
Mean Plasma Glucose: 123 (calc)
eAG (mmol/L): 6.8 (calc)

## 2020-10-16 LAB — VITAMIN B12: Vitamin B-12: 436 pg/mL (ref 200–1100)

## 2020-11-01 DIAGNOSIS — Z886 Allergy status to analgesic agent status: Secondary | ICD-10-CM | POA: Diagnosis not present

## 2020-11-01 DIAGNOSIS — R4701 Aphasia: Secondary | ICD-10-CM | POA: Diagnosis not present

## 2020-11-01 DIAGNOSIS — G4489 Other headache syndrome: Secondary | ICD-10-CM | POA: Diagnosis not present

## 2020-11-01 DIAGNOSIS — I10 Essential (primary) hypertension: Secondary | ICD-10-CM | POA: Diagnosis not present

## 2020-11-01 DIAGNOSIS — R9082 White matter disease, unspecified: Secondary | ICD-10-CM | POA: Diagnosis not present

## 2020-11-01 DIAGNOSIS — I088 Other rheumatic multiple valve diseases: Secondary | ICD-10-CM | POA: Diagnosis not present

## 2020-11-01 DIAGNOSIS — E785 Hyperlipidemia, unspecified: Secondary | ICD-10-CM | POA: Diagnosis not present

## 2020-11-01 DIAGNOSIS — R55 Syncope and collapse: Secondary | ICD-10-CM | POA: Diagnosis not present

## 2020-11-01 DIAGNOSIS — R7303 Prediabetes: Secondary | ICD-10-CM | POA: Diagnosis not present

## 2020-11-01 DIAGNOSIS — Z8673 Personal history of transient ischemic attack (TIA), and cerebral infarction without residual deficits: Secondary | ICD-10-CM | POA: Diagnosis not present

## 2020-11-01 DIAGNOSIS — R6889 Other general symptoms and signs: Secondary | ICD-10-CM | POA: Diagnosis not present

## 2020-11-01 DIAGNOSIS — J9811 Atelectasis: Secondary | ICD-10-CM | POA: Diagnosis not present

## 2020-11-01 DIAGNOSIS — R404 Transient alteration of awareness: Secondary | ICD-10-CM | POA: Diagnosis not present

## 2020-11-01 DIAGNOSIS — Z743 Need for continuous supervision: Secondary | ICD-10-CM | POA: Diagnosis not present

## 2020-11-01 DIAGNOSIS — Z888 Allergy status to other drugs, medicaments and biological substances status: Secondary | ICD-10-CM | POA: Diagnosis not present

## 2020-11-01 DIAGNOSIS — Z79899 Other long term (current) drug therapy: Secondary | ICD-10-CM | POA: Diagnosis not present

## 2020-11-01 DIAGNOSIS — G459 Transient cerebral ischemic attack, unspecified: Secondary | ICD-10-CM | POA: Diagnosis not present

## 2020-11-01 DIAGNOSIS — E876 Hypokalemia: Secondary | ICD-10-CM | POA: Diagnosis not present

## 2020-11-02 DIAGNOSIS — R7303 Prediabetes: Secondary | ICD-10-CM | POA: Diagnosis not present

## 2020-11-02 DIAGNOSIS — G459 Transient cerebral ischemic attack, unspecified: Secondary | ICD-10-CM | POA: Diagnosis not present

## 2020-11-02 DIAGNOSIS — E876 Hypokalemia: Secondary | ICD-10-CM | POA: Diagnosis not present

## 2020-11-02 DIAGNOSIS — R4701 Aphasia: Secondary | ICD-10-CM | POA: Diagnosis not present

## 2020-11-02 DIAGNOSIS — E785 Hyperlipidemia, unspecified: Secondary | ICD-10-CM | POA: Diagnosis not present

## 2020-11-06 ENCOUNTER — Encounter: Payer: Self-pay | Admitting: Family Medicine

## 2020-11-06 NOTE — Progress Notes (Signed)
Name: LANDA MULLINAX   MRN: [id number]    DOB: April 03, 1951   Date:11/07/2020       Progress Note  [facility] follow up   [facility] discharge follow up Admission date: 11/01/2020 Discharge date: 11/02/2020   Ms. Sangiovanni had a CVA in 1985, last Thursday (11/01/2020 ) she was sitting down and felt "weird" "drained" followed by having slurred speech, her daughter Dennison Bulla ) is a CMA and called 911 immediately, they arrived within minutes and she was having expressive aphasia by the time EMS arrived. She was transported by EMS to [place] , [place].Marland Kitchen When she arrived to [facility] she was able to talk but had a stutter. The [facility] physician consulted with [facility]ist and she was advised to get tPA but per discharge summary patient declined.   Studies done during stay; CT head: tiny age indeterminate lacuna infarct of the left basal ganglia, subaucte of chronic   CXR: no acute pulmonary disease MRI brain: no acute intracranial findings.  Labs: low potassium and hemoglobin dropped a little  [place] Doppler: no hemodynamically significant plaque ( less than 50 % stenosis)   She was sent home without change in medications, not on plavix , aspirin or statin due to allergic reactions. We will try starting her on Repatha, we will refer her to cardiologist and neurologist . Discussed importance of returning to [facility] if any symptoms of stroke and explained what kind of symptoms she can present with   We will recheck labs as recommended by discharge summary   Medication reconciliation done   Patient Active Problem List   Diagnosis Date Noted  . 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 Iron deficiency anemia 11/15/2018  . Endometrial adenocarcinoma ([place]) 10/29/2018  . Umbilical hernia without obstruction and without gangrene   . Atherosclerosis of right coronary artery 11/10/2017  . Atherosclerosis of  abdominal aorta ([place]) 11/10/2017  . Prediabetes 10/02/2017  . Pain in limb 03/31/2017  . Perennial allergic rhinitis with seasonal variation 05/14/2016  . Asthma, mild intermittent, well-controlled 05/14/2016  . Hypertension goal BP (blood pressure) < 140/90 12/10/2015  . Cardiomyopathy due to hypertension ([place]) 12/10/2015  . History of CVA (cerebrovascular accident) 12/10/2015  . Hyperlipidemia LDL goal <70 12/10/2015  . Statin intolerance 12/10/2015    Past Surgical History:  Procedure Laterality Date  . ABDOMINAL HYSTERECTOMY    . CHOLECYSTECTOMY    . COLONOSCOPY WITH PROPOFOL N/A 01/08/2017   Procedure: COLONOSCOPY WITH PROPOFOL;  Surgeon: [name], MD;  Location: [facility] ENDOSCOPY;  Service: Endoscopy;  Laterality: N/A;  . COLONOSCOPY WITH PROPOFOL N/A 05/05/2018   Procedure: COLONOSCOPY WITH PROPOFOL;  Surgeon: [name], MD;  Location: [facility] ENDOSCOPY;  Service: Gastroenterology;  Laterality: N/A;  . HERNIA REPAIR  [id number]   umbilical  . PORTACATH PLACEMENT N/A 11/04/2018   Procedure: INSERTION PORT-A-CATH;  Surgeon: Jules Husbands, MD;  Location: [facility] ORS;  Service: General;  Laterality: N/A;  . SENTINEL NODE BIOPSY N/A 10/06/2018   Procedure: SENTINEL NODE BIOPSY;  Surgeon: Mellody Drown, MD;  Location: [facility] ORS;  Service: Gynecology;  Laterality: N/A;  . UMBILICAL HERNIA REPAIR N/A 11/17/2017   Procedure: HERNIA REPAIR UMBILICAL ADULT;  Surgeon: Jules Husbands, MD;  Location: [facility] ORS;  Service: General;  Laterality: N/A;    Family History  Problem Relation Age of Onset  . Stroke Mother   . Hypertension Mother   . Dementia  Mother   . Alzheimer's disease Mother   . Gout Father   . Asthma Father   . Hypertension Father   . Dementia Father   . Healthy Sister   . Stroke Brother   . Alzheimer's disease Brother   . Healthy Daughter   . Hypertension Brother   . Healthy Brother   . Cancer Paternal Grandmother   . Healthy Sister   . Healthy Sister   . Hypertension  Sister   . Hypertension Sister   . Stroke Brother   . Alzheimer's disease Brother   . Stroke Brother   . Hypertension Brother   . Stroke Brother   . Hypertension Brother   . Healthy Brother   . Healthy Brother   . Hypertension Daughter   . Breast cancer Neg Hx     Social History   Tobacco Use  . Smoking status: Never Smoker  . Smokeless tobacco: Never Used  . Tobacco comment: smoking cessation materials not required  Substance Use Topics  . Alcohol use: No    Alcohol/week: 0.0 standard drinks     Current Outpatient Medications:  .  acetaminophen (TYLENOL) 500 MG tablet, Take 2 tablets (1,000 mg total) by mouth every 6 (six) hours as needed for mild pain or moderate pain., Disp: 30 tablet, Rfl: 0 .  albuterol (VENTOLIN HFA) 108 (90 Base) MCG/ACT inhaler, Inhale 2 puffs into the lungs every 4 (four) hours as needed for wheezing or shortness of breath., Disp: 18 g, Rfl: 0 .  escitalopram (LEXAPRO) 10 MG tablet, Take 1 tablet by mouth daily., Disp: 90 tablet, Rfl: 1 .  ezetimibe (ZETIA) 10 MG tablet, Take 1 tablet (10 mg total) by mouth daily., Disp: 90 tablet, Rfl: 1 .  ketoconazole (NIZORAL) 2 % cream, Apply 1 application topically daily as needed for irritation. On abdominal fold prn, Disp: 60 g, Rfl: 0 .  levocetirizine (XYZAL) 5 MG tablet, Take 1 tablet by mouth every evening., Disp: 90 tablet, Rfl: 3 .  olmesartan-hydrochlorothiazide (BENICAR HCT) 20-12.5 MG tablet, Take 1 tablet by mouth daily., Disp: 90 tablet, Rfl: 1 .  pantoprazole (PROTONIX) 20 MG tablet, Take 1 tablet by mouth daily. (Patient taking differently: Take 20 mg by mouth daily as needed. ), Disp: 90 tablet, Rfl: 3 .  potassium chloride SA (KLOR-CON) 20 MEQ tablet, Take 1 tablet by mouth daily., Disp: 90 tablet, Rfl: 3 .  pregabalin (LYRICA) 100 MG capsule, Take 1 capsule (100 mg total) by mouth 2 (two) times daily., Disp: 180 capsule, Rfl: 1 .  furosemide (LASIX) 40 MG tablet, Take 1 tablet by mouth daily.  (Patient not taking: Reported on 11/07/2020), Disp: 30 tablet, Rfl: 0  Allergies  Allergen Reactions  . Ferumoxytol Anaphylaxis  . Nsaids Hives and Rash  . Statins Other (See Comments)    Joint pains  . Victoza [Liraglutide] Other (See Comments)    pancreatitis  . Oxycodone Nausea And Vomiting  . Crestor [Rosuvastatin Calcium] Rash    I personally reviewed active problem list, medication list, allergies, family history, social history, health maintenance with the patient/caregiver today.   ROS  Constitutional: Negative for fever or weight change.  Respiratory: Negative for cough and shortness of breath.   Cardiovascular: Negative for chest pain or palpitations.  Gastrointestinal: Negative for abdominal pain, no bowel changes.  Musculoskeletal: Negative for gait problem or joint swelling.  Skin: Negative for rash.  Neurological: Negative for dizziness or headache.  No other specific complaints in a complete review of systems (  except as listed in HPI above).  Objective  Vitals:   11/07/20 0826  BP: 128/72  Pulse: 74  Resp: 16  Temp: 97.8 F (36.6 C)  TempSrc: Oral  SpO2: 97%  Weight: 235 lb 14.4 oz (107 kg)  Height: 4\' 11"  (1.499 m)    Body mass index is 47.65 kg/m.  Physical Exam  Constitutional: Patient appears well-developed and well-nourished. Obese No distress.  HEENT: head atraumatic, normocephalic, pupils equal and reactive to light, neck supple Cardiovascular: Normal rate, regular rhythm and normal heart sounds.  No murmur heard. No BLE edema. Pulmonary/Chest: Effort normal and breath sounds normal. No respiratory distress. Abdominal: Soft.  There is no tenderness. Neurological: romberg negative, normal speech, normal upper and lower extremity strength, normal sensation, cranial nerves intact Psychiatric: Patient has a normal mood and affect. behavior is normal. Judgment and thought content normal.  Recent Results (from the past 2160 hour(s))  Lipid  panel     Status: Abnormal   Collection Time: 10/15/20  4:13 PM  Result Value Ref Range   Cholesterol 218 (H) <200 mg/dL   HDL 63 > OR = 50 mg/dL   Triglycerides 134 <150 mg/dL   LDL Cholesterol (Calc) 130 (H) mg/dL (calc)    Comment: Reference range: <100 . Desirable range <100 mg/dL for primary prevention;   <70 mg/dL for patients with CHD or diabetic patients  with > or = 2 CHD risk factors. Marland Kitchen LDL-C is now calculated using the Martin-Hopkins  calculation, which is a validated novel method providing  better accuracy than the Friedewald equation in the  estimation of LDL-C.  Cresenciano Genre et al. Annamaria Helling. 2376;283(15): 2061-2068  (http://education.[employer].com/faq/FAQ164)    Total CHOL/HDL Ratio 3.5 <5.0 (calc)   Non-HDL Cholesterol (Calc) 155 (H) <130 mg/dL (calc)    Comment: For patients with diabetes plus 1 major ASCVD risk  factor, treating to a non-HDL-C goal of <100 mg/dL  (LDL-C of <70 mg/dL) is considered a therapeutic  option.   Hemoglobin A1c     Status: Abnormal   Collection Time: 10/15/20  4:13 PM  Result Value Ref Range   Hgb A1c MFr Bld 5.9 (H) <5.7 % of total Hgb    Comment: For someone without known diabetes, a hemoglobin  A1c value between 5.7% and 6.4% is consistent with prediabetes and should be confirmed with a  follow-up test. . For someone with known diabetes, a value <7% indicates that their diabetes is well controlled. A1c targets should be individualized based on duration of diabetes, age, comorbid conditions, and other considerations. . This assay result is consistent with an increased risk of diabetes. . Currently, no consensus exists regarding use of hemoglobin A1c for diagnosis of diabetes for children. .    Mean Plasma Glucose 123 (calc)   eAG (mmol/L) 6.8 (calc)  Vitamin B12     Status: None   Collection Time: 10/15/20  4:13 PM  Result Value Ref Range   Vitamin B-12 436 200 - 1,100 pg/mL      PHQ2/9: Depression screen [facility] 2/9  11/07/2020 10/15/2020 05/03/2020 04/13/2020 09/08/2019  Decreased Interest 0 0 0 0 0  Down, Depressed, Hopeless 0 0 0 0 0  PHQ - 2 Score 0 0 0 0 0  Altered sleeping 0 0 - 0 0  Tired, decreased energy 0 0 - 0 0  Change in appetite 0 0 - 0 0  Feeling bad or failure about yourself  0 0 - 0 0  Trouble concentrating 0 0 -  0 0  Moving slowly or fidgety/restless 0 0 - 0 0  Suicidal thoughts 0 0 - 0 0  PHQ-9 Score 0 0 - 0 0  Difficult doing work/chores - - - - -  Some recent data might be hidden    phq 9 is negative   Fall Risk: Fall Risk  11/07/2020 10/15/2020 05/03/2020 04/13/2020 09/08/2019  Falls in the past year? 1 0 1 0 0  Number falls in past yr: 1 0 1 1 0  Injury with Fall? 0 0 1 1 0  Risk for fall due to : - - History of fall(s);Orthopedic patient History of fall(s);Orthopedic patient -  Risk for fall due to: Comment - - - - -  Follow up - - Falls prevention discussed Education provided -     Functional Status Survey: Is the patient deaf or have difficulty hearing?: No Does the patient have difficulty seeing, even when wearing glasses/contacts?: No Does the patient have difficulty concentrating, remembering, or making decisions?: No Does the patient have difficulty walking or climbing stairs?: Yes Does the patient have difficulty dressing or bathing?: No Does the patient have difficulty doing errands alone such as visiting a doctor's office or shopping?: No    Assessment & Plan   1. Hospital discharge follow-up  - Ambulatory referral to Cardiology - Ambulatory referral to Neurology  2. Dyslipidemia   3. History of TIA (transient ischemic attack)  - Sedimentation rate - COMPLETE METABOLIC PANEL WITH GFR - C-reactive protein - Ambulatory referral to Cardiology - Ambulatory referral to Neurology - Evolocumab (REPATHA) 140 MG/ML SOSY; Inject 420 mg into the skin every 30 (thirty) days.  Dispense: 4.2 mL; Refill: 5  4. Hypokalemia  - COMPLETE METABOLIC PANEL WITH  GFR  5. History of CVA (cerebrovascular accident) without residual deficits  - Evolocumab (REPATHA) 140 MG/ML SOSY; Inject 420 mg into the skin every 30 (thirty) days.  Dispense: 4.2 mL; Refill: 5

## 2020-11-07 ENCOUNTER — Ambulatory Visit (INDEPENDENT_AMBULATORY_CARE_PROVIDER_SITE_OTHER): Payer: Medicare Other | Admitting: Family Medicine

## 2020-11-07 ENCOUNTER — Other Ambulatory Visit: Payer: Self-pay

## 2020-11-07 ENCOUNTER — Telehealth: Payer: Self-pay

## 2020-11-07 ENCOUNTER — Encounter: Payer: Self-pay | Admitting: Family Medicine

## 2020-11-07 VITALS — BP 128/72 | HR 74 | Temp 97.8°F | Resp 16 | Ht 59.0 in | Wt 235.9 lb

## 2020-11-07 DIAGNOSIS — E876 Hypokalemia: Secondary | ICD-10-CM

## 2020-11-07 DIAGNOSIS — E785 Hyperlipidemia, unspecified: Secondary | ICD-10-CM

## 2020-11-07 DIAGNOSIS — Z8673 Personal history of transient ischemic attack (TIA), and cerebral infarction without residual deficits: Secondary | ICD-10-CM

## 2020-11-07 DIAGNOSIS — M170 Bilateral primary osteoarthritis of knee: Secondary | ICD-10-CM

## 2020-11-07 DIAGNOSIS — Z09 Encounter for follow-up examination after completed treatment for conditions other than malignant neoplasm: Secondary | ICD-10-CM | POA: Diagnosis not present

## 2020-11-07 MED ORDER — REPATHA 140 MG/ML ~~LOC~~ SOSY: 420.0000 mg | PREFILLED_SYRINGE | SUBCUTANEOUS | 5 refills | Status: DC

## 2020-11-07 MED ORDER — DICLOFENAC SODIUM 1 % EX GEL: 4.0000 g | Freq: Four times a day (QID) | CUTANEOUS | 0 refills | Status: DC

## 2020-11-08 LAB — COMPLETE METABOLIC PANEL WITHOUT GFR
AG Ratio: 1.7 (calc) (ref 1.0–2.5)
ALT: 12 U/L (ref 6–29)
AST: 15 U/L (ref 10–35)
Albumin: 4.1 g/dL (ref 3.6–5.1)
Alkaline phosphatase (APISO): 85 U/L (ref 37–153)
BUN: 14 mg/dL (ref 7–25)
CO2: 29 mmol/L (ref 20–32)
Calcium: 9.6 mg/dL (ref 8.6–10.4)
Chloride: 103 mmol/L (ref 98–110)
Creat: 0.85 mg/dL (ref 0.50–0.99)
GFR, Est African American: 81 mL/min/{1.73_m2}
GFR, Est Non African American: 70 mL/min/{1.73_m2}
Globulin: 2.4 g/dL (ref 1.9–3.7)
Glucose, Bld: 105 mg/dL — ABNORMAL HIGH (ref 65–99)
Potassium: 4.1 mmol/L (ref 3.5–5.3)
Sodium: 140 mmol/L (ref 135–146)
Total Bilirubin: 0.5 mg/dL (ref 0.2–1.2)
Total Protein: 6.5 g/dL (ref 6.1–8.1)

## 2020-11-08 LAB — SEDIMENTATION RATE: Sed Rate: 36 mm/h — ABNORMAL HIGH (ref 0–30)

## 2020-11-08 LAB — C-REACTIVE PROTEIN: CRP: 30.2 mg/L — ABNORMAL HIGH (ref <8.0)

## 2020-11-09 ENCOUNTER — Other Ambulatory Visit: Payer: Self-pay

## 2020-11-09 ENCOUNTER — Encounter: Payer: Self-pay | Admitting: Cardiology

## 2020-11-09 ENCOUNTER — Ambulatory Visit (INDEPENDENT_AMBULATORY_CARE_PROVIDER_SITE_OTHER): Payer: Medicare Other

## 2020-11-09 ENCOUNTER — Ambulatory Visit (INDEPENDENT_AMBULATORY_CARE_PROVIDER_SITE_OTHER): Payer: Medicare Other | Admitting: Cardiology

## 2020-11-09 VITALS — BP 128/80 | HR 77 | Ht 59.0 in | Wt 234.0 lb

## 2020-11-09 DIAGNOSIS — I639 Cerebral infarction, unspecified: Secondary | ICD-10-CM | POA: Diagnosis not present

## 2020-11-09 DIAGNOSIS — E7800 Pure hypercholesterolemia, unspecified: Secondary | ICD-10-CM | POA: Diagnosis not present

## 2020-11-09 DIAGNOSIS — I10 Essential (primary) hypertension: Secondary | ICD-10-CM

## 2020-11-09 MED ORDER — CLOPIDOGREL BISULFATE 75 MG PO TABS: 75.0000 mg | ORAL_TABLET | Freq: Every day | ORAL | 0 refills | Status: DC

## 2020-11-09 MED ORDER — CLOPIDOGREL BISULFATE 75 MG PO TABS
75.0000 mg | ORAL_TABLET | Freq: Every day | ORAL | 5 refills | Status: DC
Start: 2020-11-09 — End: 2020-11-09

## 2020-11-09 NOTE — Progress Notes (Signed)
Cardiology Office Note:    Date:  18/84/1660   ID:  Molly [name], DOB [birth year], MRN [id number]  PCP:  Steele Sizer, MD  [facility] Cardiologist:  Kate Sable, MD  [place] Electrophysiologist:  None   Referring MD: Steele Sizer, MD   Chief Complaint  Patient presents with  . New Patient (Initial Visit)    Ref by Dr. Ancil Boozer for recent [facility] admission of a TIA. Meds reviewed by the pt. verbally. Pt. c/o LE edema.     History of Present Illness:    Molly [name] is a 69 y.o. female with a hx of hypertension, hyperlipidemia, CVA (in 1995, no residual deficits) who presents due to recent TIA.  Patient had symptoms of slurred speech, difficulty speaking and felt drained about a week ago.  Her daughter who was talking to patient via phone at the time advised friends to call EMS.  Patient was taken to the ED in [place].  Patient also had some aphasia at time of EMS arrival.  Upon arrival at the ED, patient states symptoms have improved but had a little stuttering in her speech.  TPA was recommended by neurology but patient declined.  MRI brain with no acute intracranial findings.  CT head showed old infarcts.  Carotid Doppler with no significant plaque.  She states having an echocardiogram performed, does not know results.  She has history of statin allergies, was started on Repatha 2 days ago by PCP.  She does not take aspirin due to NSAID allergies.  She is scheduled to see neurology later this month.  Denies any chest pain, shortness of breath, palpitations.  To have extremity swelling, but this is resolved after her hysterectomy for uterine cancer.  Past Medical History:  Diagnosis Date  . Anxiety   . Asthma    history of asthma  . Cardiomyopathy ([place])   . Complication of anesthesia    tore hair out and made teeth rough  . COVID-19 virus infection 09/08/2019  . Depression   . Endometrial cancer ([place]) 08/2018  . GERD (gastroesophageal reflux  disease)    takes prilosec prn  . History of CVA (cerebrovascular accident) 12/10/2015  . Hyperlipidemia LDL goal <70 12/10/2015  . Hypertension   . Prediabetes 10/02/2017   A1c 6 in January 2018  . Stroke [facility]) 1990    no residual effects    Past Surgical History:  Procedure Laterality Date  . ABDOMINAL HYSTERECTOMY    . CHOLECYSTECTOMY    . COLONOSCOPY WITH PROPOFOL N/A 01/08/2017   Procedure: COLONOSCOPY WITH PROPOFOL;  Surgeon: [name], MD;  Location: [facility] ENDOSCOPY;  Service: Endoscopy;  Laterality: N/A;  . COLONOSCOPY WITH PROPOFOL N/A 05/05/2018   Procedure: COLONOSCOPY WITH PROPOFOL;  Surgeon: [name], MD;  Location[facility] ENDOSCOPY;  Service: Gastroenterology;  Laterality: N/A;  . HERNIA REPAIR  20/2542   umbilical  . PORTACATH PLACEMENT N/A 11/04/2018   Procedure: INSERTION PORT-A-CATH;  Surgeon: Jules Husbands, MD;  Location: [facility] ORS;  Service: General;  Laterality: N/A;  . SENTINEL NODE BIOPSY N/A 10/06/2018   Procedure: SENTINEL NODE BIOPSY;  Surgeon: Mellody Drown, MD;  Location: [facility] ORS;  Service: Gynecology;  Laterality: N/A;  . UMBILICAL HERNIA REPAIR N/A 11/17/2017   Procedure: HERNIA REPAIR UMBILICAL ADULT;  Surgeon: Jules Husbands, MD;  Location: [facility] ORS;  Service: General;  Laterality: N/A;    Current Medications: Current Meds  Medication Sig  . acetaminophen (TYLENOL) 500 MG tablet Take 2 tablets (1,000 mg  total) by mouth every 6 (six) hours as needed for mild pain or moderate pain.  Marland Kitchen albuterol (VENTOLIN HFA) 108 (90 Base) MCG/ACT inhaler Inhale 2 puffs into the lungs every 4 (four) hours as needed for wheezing or shortness of breath.  . diclofenac Sodium (VOLTAREN) 1 % GEL Apply 4 g topically 4 (four) times daily.  Marland Kitchen escitalopram (LEXAPRO) 10 MG tablet Take 1 tablet by mouth daily.  . Evolocumab (REPATHA) 140 MG/ML SOSY Inject 420 mg into the skin every 30 (thirty) days.  Marland Kitchen ezetimibe (ZETIA) 10 MG tablet Take 1 tablet (10 mg total) by mouth daily.   . furosemide (LASIX) 40 MG tablet Take 1 tablet by mouth daily. (Patient taking differently: as needed. )  . ketoconazole (NIZORAL) 2 % cream Apply 1 application topically daily as needed for irritation. On abdominal fold prn  . levocetirizine (XYZAL) 5 MG tablet Take 1 tablet by mouth every evening.  . olmesartan-hydrochlorothiazide (BENICAR HCT) 20-12.5 MG tablet Take 1 tablet by mouth daily.  . pantoprazole (PROTONIX) 20 MG tablet Take 1 tablet by mouth daily. (Patient taking differently: Take 20 mg by mouth daily as needed. )  . potassium chloride SA (KLOR-CON) 20 MEQ tablet Take 1 tablet by mouth daily.  . pregabalin (LYRICA) 100 MG capsule Take 1 capsule (100 mg total) by mouth 2 (two) times daily.     Allergies:   Ferumoxytol, Nsaids, Statins, Victoza [liraglutide], Oxycodone, and Crestor [rosuvastatin calcium]   Social History   Socioeconomic History  . Marital status: Divorced    Spouse name: Not on file  . Number of children: 2  . Years of education: some college  . Highest education level: 12th grade  Occupational History  . 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 Occupation: currently a Theme park manager  Tobacco Use  . Smoking status: Never Smoker  . Smokeless tobacco: Never Used  . Tobacco comment: smoking cessation materials not required  Vaping Use  . Vaping Use: Never used  Substance and Sexual Activity  . Alcohol use: No    Alcohol/week: 0.0 standard drinks  . Drug use: No  . Sexual activity: Not Currently  Other Topics Concern  . Not on file  Social History Narrative  . Not on file   Social Determinants of Health   Financial Resource Strain: Medium Risk  . Difficulty of Paying Living Expenses: Somewhat hard  Food Insecurity: No Food Insecurity  . Worried About Charity fundraiser in the Last Year: Never true  . Ran Out of Food in the Last Year: Never true  Transportation Needs: No Transportation Needs  . Lack of Transportation (Medical):  No  . Lack of Transportation (Non-Medical): No  Physical Activity: Inactive  . Days of Exercise per Week: 0 days  . Minutes of Exercise per Session: 0 min  Stress: No Stress Concern Present  . Feeling of Stress : Not at all  Social Connections: Moderately Integrated  . Frequency of Communication with Friends and Family: More than three times a week  . Frequency of Social Gatherings with Friends and Family: Three times a week  . Attends Religious Services: More than 4 times per year  . Active Member of Clubs or Organizations: Yes  . Attends Archivist Meetings: More than 4 times per year  . Marital Status: Divorced     Family History: The patient's family history includes Alzheimer's disease in her brother, brother, and mother; Asthma in her father;  Cancer in her paternal grandmother; Dementia in her father and mother; Gout in her father; Healthy in her brother, brother, brother, daughter, sister, sister, and sister; Hypertension in her brother, brother, brother, daughter, father, mother, sister, and sister; Stroke in her brother, brother, brother, brother, and mother. There is no history of Breast cancer.  ROS:   Please see the history of present illness.     All other systems reviewed and are negative.  EKGs/Labs/Other Studies Reviewed:    The following studies were reviewed today:   EKG:  EKG is  ordered today.  The ekg ordered today demonstrates normal sinus rhythm  Recent Labs: 07/13/2020: Hemoglobin 11.2; Platelets 265 11/07/2020: ALT 12; BUN 14; Creat 0.85; Potassium 4.1; Sodium 140  Recent Lipid Panel    Component Value Date/Time   CHOL 218 (H) 10/15/2020 1613   TRIG 134 10/15/2020 1613   HDL 63 10/15/2020 1613   CHOLHDL 3.5 10/15/2020 1613   VLDL 20 09/08/2019 1137   LDLCALC 130 (H) 10/15/2020 1613     Risk Assessment/Calculations:      Physical Exam:    VS:  BP 128/80 (BP Location: Right Arm, Patient Position: Sitting, Cuff Size: Large)   Pulse 77    Ht 4\' 11"  (1.499 m)   Wt 234 lb (106.1 kg)   SpO2 97%   BMI 47.26 kg/m     Wt Readings from Last 3 Encounters:  11/09/20 234 lb (106.1 kg)  11/07/20 235 lb 14.4 oz (107 kg)  10/15/20 236 lb 4.8 oz (107.2 kg)     GEN:  Well nourished, well developed in no acute distress HEENT: Normal NECK: No JVD; No carotid bruits LYMPHATICS: No lymphadenopathy CARDIAC: RRR, no murmurs, rubs, gallops RESPIRATORY:  Clear to auscultation without rales, wheezing or rhonchi  ABDOMEN: Soft, non-tender, distended MUSCULOSKELETAL:  No edema; No deformity  SKIN: Warm and dry NEUROLOGIC:  Alert and oriented x 3 PSYCHIATRIC:  Normal affect   ASSESSMENT:    1. Cerebrovascular accident (CVA), unspecified mechanism ([place])   2. Primary hypertension   3. Pure hypercholesterolemia    PLAN:    In order of problems listed above:  1. Patient with history of previous CVA, recent TIA.  Place cardiac monitor to evaluate for any contributing arrhythmia such as A. fib/flutter.  Get echocardiogram report from outside hospital.  Start Plavix 75 mg daily.  Repatha, keep appointment with neurology. 2. History of hypertension, BP controlled, continue Benicar HCTZ as prescribed 3. History of hyperlipidemia, not tolerant to statins.  Continue Zetia, Repatha.  Follow-up after cardiac monitor.    Shared Decision Making/Informed Consent       Medication Adjustments/Labs and Tests Ordered: Current medicines are reviewed at length with the patient today.  Concerns regarding medicines are outlined above.  Orders Placed This Encounter  Procedures  . LONG TERM MONITOR (3-14 DAYS)  . EKG 12-Lead   Meds ordered this encounter  Medications  . DISCONTD: clopidogrel (PLAVIX) 75 MG tablet    Sig: Take 1 tablet (75 mg total) by mouth daily.    Dispense:  30 tablet    Refill:  5  . clopidogrel (PLAVIX) 75 MG tablet    Sig: Take 1 tablet (75 mg total) by mouth daily.    Dispense:  30 tablet    Refill:  0     Patient Instructions  Medication Instructions:   Your physician has recommended you make the following change in your medication:   START Plavix 75mg  ONCE daily  *If you  need a refill on your cardiac medications before your next appointment, please call your pharmacy*   Lab Work: None ordered.   Testing/Procedures:  Your physician has recommended that you wear a Zio monitor for 2 weeks. This monitor is a medical device that records the heart's electrical activity. Doctors most often use these monitors to diagnose arrhythmias. Arrhythmias are problems with the speed or rhythm of the heartbeat. The monitor is a small device applied to your chest. You can wear one while you do your normal daily activities. While wearing this monitor if you have any symptoms to push the button and record what you felt. Once you have worn this monitor for the period of time provider prescribed (Usually 14 days), you will return the monitor device in the postage paid box. Once it is returned they will download the data collected and provide [place] with a report which the provider will then review and we will call you with those results. Important tips:  1. Avoid showering during the first 24 hours of wearing the monitor. 2. Avoid excessive sweating to help maximize wear time. 3. Do not submerge the device, no hot tubs, and no swimming pools. 4. Keep any lotions or oils away from the patch. 5. After 24 hours you may shower with the patch on. Take brief showers with your back facing the shower head.  6. Do not remove patch once it has been placed because that will interrupt data and decrease adhesive wear time. 7. Push the button when you have any symptoms and write down what you were feeling. 8. Once you have completed wearing your monitor, remove and place into box which has postage paid and place in your outgoing mailbox.  9. If for some reason you have misplaced your box then call our office and we can  provide another box and/or mail it off for you.         Follow-Up: At [facility], you and your health needs are our priority.  As part of our continuing mission to provide you with exceptional heart care, we have created designated Provider Care Teams.  These Care Teams include your primary Cardiologist (physician) and Advanced Practice Providers (APPs -  Physician Assistants and Nurse Practitioners) who all work together to provide you with the care you need, when you need it.  We recommend signing up for the patient portal called "MyChart".  Sign up information is provided on this After Visit Summary.  MyChart is used to connect with patients for Virtual Visits (Telemedicine).  Patients are able to view lab/test results, encounter notes, upcoming appointments, etc.  Non-urgent messages can be sent to your provider as well.   To learn more about what you can do with MyChart, go to [email or web address].    Your next appointment:   5 week(s)  The format for your next appointment:   In Person  Provider:   You may see Kate Sable, MD      Signed, Kate Sable, MD  11/09/2020 3:58 PM    [place]

## 2020-11-09 NOTE — Patient Instructions (Signed)
Medication Instructions:   Your physician has recommended you make the following change in your medication:   START Plavix 75mg  ONCE daily  *If you need a refill on your cardiac medications before your next appointment, please call your pharmacy*   Lab Work: None ordered.   Testing/Procedures:  Your physician has recommended that you wear a Zio monitor for 2 weeks. This monitor is a medical device that records the heart's electrical activity. Doctors most often use these monitors to diagnose arrhythmias. Arrhythmias are problems with the speed or rhythm of the heartbeat. The monitor is a small device applied to your chest. You can wear one while you do your normal daily activities. While wearing this monitor if you have any symptoms to push the button and record what you felt. Once you have worn this monitor for the period of time provider prescribed (Usually 14 days), you will return the monitor device in the postage paid box. Once it is returned they will download the data collected and provide [place] with a report which the provider will then review and we will call you with those results. Important tips:  1. Avoid showering during the first 24 hours of wearing the monitor. 2. Avoid excessive sweating to help maximize wear time. 3. Do not submerge the device, no hot tubs, and no swimming pools. 4. Keep any lotions or oils away from the patch. 5. After 24 hours you may shower with the patch on. Take brief showers with your back facing the shower head.  6. Do not remove patch once it has been placed because that will interrupt data and decrease adhesive wear time. 7. Push the button when you have any symptoms and write down what you were feeling. 8. Once you have completed wearing your monitor, remove and place into box which has postage paid and place in your outgoing mailbox.  9. If for some reason you have misplaced your box then call our office and we can provide another box and/or mail it  off for you.         Follow-Up: At [facility], you and your health needs are our priority.  As part of our continuing mission to provide you with exceptional heart care, we have created designated Provider Care Teams.  These Care Teams include your primary Cardiologist (physician) and Advanced Practice Providers (APPs -  Physician Assistants and Nurse Practitioners) who all work together to provide you with the care you need, when you need it.  We recommend signing up for the patient portal called "MyChart".  Sign up information is provided on this After Visit Summary.  MyChart is used to connect with patients for Virtual Visits (Telemedicine).  Patients are able to view lab/test results, encounter notes, upcoming appointments, etc.  Non-urgent messages can be sent to your provider as well.   To learn more about what you can do with MyChart, go to [email or web address].    Your next appointment:   5 week(s)  The format for your next appointment:   In Person  Provider:   You may see Kate Sable, MD

## 2020-11-12 ENCOUNTER — Other Ambulatory Visit: Payer: Self-pay

## 2020-11-12 ENCOUNTER — Ambulatory Visit
Admission: RE | Admit: 2020-11-12 | Discharge: 2020-11-12 | Disposition: A | Discharge status: Home / Self Care | Payer: Medicare Other | Source: Ambulatory Visit | Attending: Radiation Oncology | Admitting: Radiation Oncology

## 2020-11-12 DIAGNOSIS — M5136 Other intervertebral disc degeneration, lumbar region: Secondary | ICD-10-CM | POA: Diagnosis not present

## 2020-11-12 DIAGNOSIS — C541 Malignant neoplasm of endometrium: Secondary | ICD-10-CM | POA: Diagnosis not present

## 2020-11-12 DIAGNOSIS — M47817 Spondylosis without myelopathy or radiculopathy, lumbosacral region: Secondary | ICD-10-CM | POA: Diagnosis not present

## 2020-11-12 DIAGNOSIS — M4697 Unspecified inflammatory spondylopathy, lumbosacral region: Secondary | ICD-10-CM | POA: Diagnosis not present

## 2020-11-12 MED ORDER — IOHEXOL 300 MG/ML  SOLN
100.0000 mL | Freq: Once | INTRAMUSCULAR | Status: AC | PRN
  Administered 2020-11-12: 100 mL via INTRAVENOUS

## 2020-11-20 ENCOUNTER — Encounter: Payer: Self-pay | Admitting: Radiation Oncology

## 2020-11-20 DIAGNOSIS — M5481 Occipital neuralgia: Secondary | ICD-10-CM | POA: Diagnosis not present

## 2020-11-20 DIAGNOSIS — R569 Unspecified convulsions: Secondary | ICD-10-CM | POA: Diagnosis not present

## 2020-11-20 DIAGNOSIS — R0683 Snoring: Secondary | ICD-10-CM | POA: Diagnosis not present

## 2020-11-21 ENCOUNTER — Encounter: Payer: Self-pay | Admitting: Radiation Oncology

## 2020-11-21 ENCOUNTER — Other Ambulatory Visit: Payer: Self-pay

## 2020-11-21 ENCOUNTER — Ambulatory Visit
Admission: RE | Admit: 2020-11-21 | Discharge: 2020-11-21 | Disposition: A | Discharge status: Home / Self Care | Payer: Medicare Other | Source: Ambulatory Visit | Attending: Radiation Oncology | Admitting: Radiation Oncology

## 2020-11-21 VITALS — BP 138/85 | HR 53 | Temp 97.6°F | Wt 238.4 lb

## 2020-11-21 DIAGNOSIS — C541 Malignant neoplasm of endometrium: Secondary | ICD-10-CM

## 2020-11-21 NOTE — Progress Notes (Signed)
Radiation Oncology Follow up Note  Name: Molly [name]   Date:   11/21/2020 MRN:  [id number] DOB: 18-Jun-1951    This 69 y.o. female presents to the clinic today for 76-month follow-up status post both pelvic and periaortic radiation therapy for stage IIIc endometrial carcinoma.  REFERRING PROVIDER: Steele Sizer, MD  HPI: Patient is a 69 year old female now out 16 months having completed radiation therapy to both her pelvic and periaortic lymph nodes for stage IIIc endometrial carcinoma. She initially had TAH/BSO showing FIGO grade 2 adenocarcinoma with 33% myometrial invasion and 2 out of 3 sentinel lymph nodes were positive for macro metastatic disease. She is seen today in routine follow-up is doing well specifically denies any increased lower urinary tract symptoms diarrhea or fatigue. She had pelvic exam in September by Dr. Fransisca Connors which was normal.. She had a CT scan this month showing no findings of recurrent disease  COMPLICATIONS OF TREATMENT: none  FOLLOW UP COMPLIANCE: keeps appointments   PHYSICAL EXAM:  BP 138/85 (BP Location: Right Wrist, Patient Position: Sitting, Cuff Size: Small)   Pulse (!) 53   Temp 97.6 F (36.4 C) (Tympanic)   Wt 238 lb 6.4 oz (108.1 kg)   BMI 48.15 kg/m  Well-developed well-nourished patient in NAD. HEENT reveals PERLA, EOMI, discs not visualized.  Oral cavity is clear. No oral mucosal lesions are identified. Neck is clear without evidence of cervical or supraclavicular adenopathy. Lungs are clear to A&P. Cardiac examination is essentially unremarkable with regular rate and rhythm without murmur rub or thrill. Abdomen is benign with no organomegaly or masses noted. Motor sensory and DTR levels are equal and symmetric in the upper and lower extremities. Cranial nerves II through XII are grossly intact. Proprioception is intact. No peripheral adenopathy or edema is identified. No motor or sensory levels are noted. Crude visual fields are within  normal range.  RADIOLOGY RESULTS: CT scans reviewed compatible with above-stated findings  PLAN: Present time patient is continues to do well with no evidence of disease. I will see her back in 1 year for follow-up and then discontinue follow-up care. She continues have close follow-up care with Dr. Fransisca Connors. Patient knows to call with any concerns.  I would like to take this opportunity to thank you for allowing me to participate in the care of your patient.Noreene Filbert, MD

## 2020-11-24 ENCOUNTER — Other Ambulatory Visit: Payer: Self-pay | Admitting: Family Medicine

## 2020-11-24 DIAGNOSIS — M170 Bilateral primary osteoarthritis of knee: Secondary | ICD-10-CM

## 2020-11-26 ENCOUNTER — Other Ambulatory Visit: Payer: Self-pay | Admitting: Family Medicine

## 2020-11-26 DIAGNOSIS — M170 Bilateral primary osteoarthritis of knee: Secondary | ICD-10-CM

## 2020-11-26 MED ORDER — DICLOFENAC SODIUM 1 % EX GEL: 4.0000 g | Freq: Four times a day (QID) | CUTANEOUS | 0 refills | Status: DC

## 2020-11-26 NOTE — Telephone Encounter (Signed)
Pill pack is calling for a refill on Diclofencac 1 percent gel

## 2020-12-07 DIAGNOSIS — I493 Ventricular premature depolarization: Secondary | ICD-10-CM | POA: Diagnosis not present

## 2020-12-07 DIAGNOSIS — I639 Cerebral infarction, unspecified: Secondary | ICD-10-CM | POA: Diagnosis not present

## 2020-12-07 NOTE — Telephone Encounter (Signed)
Completed.

## 2020-12-14 DIAGNOSIS — R569 Unspecified convulsions: Secondary | ICD-10-CM | POA: Diagnosis not present

## 2020-12-17 ENCOUNTER — Other Ambulatory Visit: Payer: Self-pay

## 2020-12-17 ENCOUNTER — Ambulatory Visit (INDEPENDENT_AMBULATORY_CARE_PROVIDER_SITE_OTHER): Payer: Medicare Other | Admitting: Cardiology

## 2020-12-17 ENCOUNTER — Encounter: Payer: Self-pay | Admitting: Cardiology

## 2020-12-17 VITALS — BP 120/74 | HR 74 | Ht 59.0 in | Wt 238.0 lb

## 2020-12-17 DIAGNOSIS — I10 Essential (primary) hypertension: Secondary | ICD-10-CM | POA: Diagnosis not present

## 2020-12-17 DIAGNOSIS — E7800 Pure hypercholesterolemia, unspecified: Secondary | ICD-10-CM

## 2020-12-17 DIAGNOSIS — I639 Cerebral infarction, unspecified: Secondary | ICD-10-CM | POA: Diagnosis not present

## 2020-12-17 NOTE — Patient Instructions (Signed)

## 2020-12-17 NOTE — Progress Notes (Signed)
Cardiology Office Note:    Date:  32/99/2426   ID:  Molly [name], DOB [birth year], MRN [id number]  PCP:  Steele Sizer, MD  [facility] Cardiologist:  Kate Sable, MD  [place] Electrophysiologist:  None   Referring MD: Steele Sizer, MD   Chief Complaint  Patient presents with  . Other    5 wk f/u zio c/o D/C plavix due to muscle aches/cramping x2 days off. Meds reviewed verbally with pt.    History of Present Illness:    Molly [name] is a 69 y.o. female with a hx of hypertension, hyperlipidemia, CVA (in 1995, no residual deficits), endometrial carcinoma who presents for follow-up.  Last seen due to recent TIA.  She had symptoms of slurred speech, difficulty speaking at the time.  She has starting allergies, takes Repatha.  Also has allergies to NSAIDs, Plavix was started after last visit but states developing muscle aches/cramping and stopped taking Plavix.  Cardiac monitor was placed to evaluate any significant arrhythmia such as A. fib or flutter in light of CVA.  Denies chest pain, palpitations.  Not on aspirin due to aspirin allergies.  Prior notes  her daughter who was talking to patient via phone at the time advised friends to call EMS.  Patient was taken to the ED in [place].  Patient also had some aphasia at time of EMS arrival.  Upon arrival at the ED, patient states symptoms have improved but had a little stuttering in her speech.  TPA was recommended by neurology but patient declined.  MRI brain with no acute intracranial findings.  CT head showed old infarcts.  Carotid Doppler with no significant plaque.  She states having an echocardiogram performed, does not know results.  She has history of statin allergies, was started on Repatha 2 days ago by PCP.  She does not take aspirin due to NSAID allergies.     Past Medical History:  Diagnosis Date  . Anxiety   . Asthma    history of asthma  . Cardiomyopathy ([place])   . Complication of  anesthesia    tore hair out and made teeth rough  . COVID-19 virus infection 09/08/2019  . Depression   . Endometrial cancer ([place]) 08/2018  . GERD (gastroesophageal reflux disease)    takes prilosec prn  . History of CVA (cerebrovascular accident) 12/10/2015  . Hyperlipidemia LDL goal <70 12/10/2015  . Hypertension   . Prediabetes 10/02/2017   A1c 6 in January 2018  . Stroke [facility]) 1990    no residual effects    Past Surgical History:  Procedure Laterality Date  . ABDOMINAL HYSTERECTOMY    . CHOLECYSTECTOMY    . COLONOSCOPY WITH PROPOFOL N/A 01/08/2017   Procedure: COLONOSCOPY WITH PROPOFOL;  Surgeon: [name], MD;  Location: [facility] ENDOSCOPY;  Service: Endoscopy;  Laterality: N/A;  . COLONOSCOPY WITH PROPOFOL N/A 05/05/2018   Procedure: COLONOSCOPY WITH PROPOFOL;  Surgeon: [name], MD;  Location: [facility] ENDOSCOPY;  Service: Gastroenterology;  Laterality: N/A;  . HERNIA REPAIR  94/1740   umbilical  . PORTACATH PLACEMENT N/A 11/04/2018   Procedure: INSERTION PORT-A-CATH;  Surgeon: Jules Husbands, MD;  Location: [facility] ORS;  Service: General;  Laterality: N/A;  . SENTINEL NODE BIOPSY N/A 10/06/2018   Procedure: SENTINEL NODE BIOPSY;  Surgeon: Mellody Drown, MD;  Location: [facility] ORS;  Service: Gynecology;  Laterality: N/A;  . UMBILICAL HERNIA REPAIR N/A 11/17/2017   Procedure: HERNIA REPAIR UMBILICAL ADULT;  Surgeon: Jules Husbands, MD;  Location:  [facility] ORS;  Service: General;  Laterality: N/A;    Current Medications: Current Meds  Medication Sig  . acetaminophen (TYLENOL) 500 MG tablet Take 2 tablets (1,000 mg total) by mouth every 6 (six) hours as needed for mild pain or moderate pain.  Marland Kitchen albuterol (VENTOLIN HFA) 108 (90 Base) MCG/ACT inhaler Inhale 2 puffs into the lungs every 4 (four) hours as needed for wheezing or shortness of breath.  . diclofenac Sodium (VOLTAREN) 1 % GEL Apply 4 g topically 4 (four) times daily.  Marland Kitchen escitalopram (LEXAPRO) 10 MG tablet Take 1 tablet by mouth  daily.  . Evolocumab (REPATHA) 140 MG/ML SOSY Inject 420 mg into the skin every 30 (thirty) days.  Marland Kitchen ezetimibe (ZETIA) 10 MG tablet Take 1 tablet (10 mg total) by mouth daily.  . furosemide (LASIX) 40 MG tablet Take 1 tablet by mouth daily. (Patient taking differently: as needed.)  . ketoconazole (NIZORAL) 2 % cream Apply 1 application topically daily as needed for irritation. On abdominal fold prn  . levocetirizine (XYZAL) 5 MG tablet Take 1 tablet by mouth every evening.  . olmesartan-hydrochlorothiazide (BENICAR HCT) 20-12.5 MG tablet Take 1 tablet by mouth daily.  . pantoprazole (PROTONIX) 20 MG tablet Take 1 tablet by mouth daily. (Patient taking differently: Take 20 mg by mouth daily as needed.)  . potassium chloride SA (KLOR-CON) 20 MEQ tablet Take 1 tablet by mouth daily.  . pregabalin (LYRICA) 100 MG capsule Take 1 capsule (100 mg total) by mouth 2 (two) times daily.     Allergies:   Ferumoxytol, Nsaids, Statins, Victoza [liraglutide], Oxycodone, and Crestor [rosuvastatin calcium]   Social History   Socioeconomic History  . Marital status: Divorced    Spouse name: Not on file  . Number of children: 2  . Years of education: some college  . Highest education level: 12th grade  Occupational History  . 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 Occupation: currently a Theme park manager  Tobacco Use  . Smoking status: Never Smoker  . Smokeless tobacco: Never Used  . Tobacco comment: smoking cessation materials not required  Vaping Use  . Vaping Use: Never used  Substance and Sexual Activity  . Alcohol use: No    Alcohol/week: 0.0 standard drinks  . Drug use: No  . Sexual activity: Not Currently  Other Topics Concern  . Not on file  Social History Narrative  . Not on file   Social Determinants of Health   Financial Resource Strain: Medium Risk  . Difficulty of Paying Living Expenses: Somewhat hard  Food Insecurity: No Food Insecurity  . Worried About Paediatric nurse in the Last Year: Never true  . Ran Out of Food in the Last Year: Never true  Transportation Needs: No Transportation Needs  . Lack of Transportation (Medical): No  . Lack of Transportation (Non-Medical): No  Physical Activity: Inactive  . Days of Exercise per Week: 0 days  . Minutes of Exercise per Session: 0 min  Stress: No Stress Concern Present  . Feeling of Stress : Not at all  Social Connections: Moderately Integrated  . Frequency of Communication with Friends and Family: More than three times a week  . Frequency of Social Gatherings with Friends and Family: Three times a week  . Attends Religious Services: More than 4 times per year  . Active Member of Clubs or Organizations: Yes  . Attends Archivist Meetings: More than 4 times per year  .  Marital Status: Divorced     Family History: The patient's family history includes Alzheimer's disease in her brother, brother, and mother; Asthma in her father; Cancer in her paternal grandmother; Dementia in her father and mother; Gout in her father; Healthy in her brother, brother, brother, daughter, sister, sister, and sister; Hypertension in her brother, brother, brother, daughter, father, mother, sister, and sister; Stroke in her brother, brother, brother, brother, and mother. There is no history of Breast cancer.  ROS:   Please see the history of present illness.     All other systems reviewed and are negative.  EKGs/Labs/Other Studies Reviewed:    The following studies were reviewed today:   EKG:  EKG not ordered today.   Recent Labs: 07/13/2020: Hemoglobin 11.2; Platelets 265 11/07/2020: ALT 12; BUN 14; Creat 0.85; Potassium 4.1; Sodium 140  Recent Lipid Panel    Component Value Date/Time   CHOL 218 (H) 10/15/2020 1613   TRIG 134 10/15/2020 1613   HDL 63 10/15/2020 1613   CHOLHDL 3.5 10/15/2020 1613   VLDL 20 09/08/2019 1137   LDLCALC 130 (H) 10/15/2020 1613     Risk Assessment/Calculations:       Physical Exam:    VS:  BP 120/74 (BP Location: Left Arm, Patient Position: Sitting, Cuff Size: Large)   Pulse 74   Ht 4\' 11"  (1.499 m)   Wt 238 lb (108 kg)   SpO2 97%   BMI 48.07 kg/m     Wt Readings from Last 3 Encounters:  12/17/20 238 lb (108 kg)  11/20/20 238 lb 6.4 oz (108.1 kg)  11/09/20 234 lb (106.1 kg)     GEN:  Well nourished, well developed in no acute distress HEENT: Normal NECK: No JVD; No carotid bruits LYMPHATICS: No lymphadenopathy CARDIAC: RRR, no murmurs, rubs, gallops RESPIRATORY:  Clear to auscultation without rales, wheezing or rhonchi  ABDOMEN: Soft, non-tender, distended MUSCULOSKELETAL:  No edema; No deformity  SKIN: Warm and dry NEUROLOGIC:  Alert and oriented x 3 PSYCHIATRIC:  Normal affect   ASSESSMENT:    1. Cerebrovascular accident (CVA), unspecified mechanism ([place])   2. Primary hypertension   3. Pure hypercholesterolemia    PLAN:    In order of problems listed above:  1. Patient with history of previous CVA, recent TIA.  Cardiac monitor with no evidence of atrial fibrillation or flutter.  Patient did not tolerate Plavix.  She has allergies to NSAIDs, not taking aspirin.  Follow-up with neurology.  Continue Repatha. 2. History of hypertension, BP controlled, continue Benicar HCTZ as prescribed 3. History of hyperlipidemia, not tolerant to statins.  Continue Zetia, Repatha.  Follow-up in a yr    Shared Decision Making/Informed Consent       Medication Adjustments/Labs and Tests Ordered: Current medicines are reviewed at length with the patient today.  Concerns regarding medicines are outlined above.  Orders Placed This Encounter  Procedures  . EKG 12-Lead   No orders of the defined types were placed in this encounter.   Patient Instructions  Medication Instructions:  Your physician recommends that you continue on your current medications as directed. Please refer to the Current Medication list given to you today.  *If  you need a refill on your cardiac medications before your next appointment, please call your pharmacy*   Lab Work: None Ordered If you have labs (blood work) drawn today and your tests are completely normal, you will receive your results only by: Marland Kitchen MyChart Message (if you have MyChart) OR . A  paper copy in the mail If you have any lab test that is abnormal or we need to change your treatment, we will call you to review the results.   Testing/Procedures: None Ordered   Follow-Up: At [facility], you and your health needs are our priority.  As part of our continuing mission to provide you with exceptional heart care, we have created designated Provider Care Teams.  These Care Teams include your primary Cardiologist (physician) and Advanced Practice Providers (APPs -  Physician Assistants and Nurse Practitioners) who all work together to provide you with the care you need, when you need it.  We recommend signing up for the patient portal called "MyChart".  Sign up information is provided on this After Visit Summary.  MyChart is used to connect with patients for Virtual Visits (Telemedicine).  Patients are able to view lab/test results, encounter notes, upcoming appointments, etc.  Non-urgent messages can be sent to your provider as well.   To learn more about what you can do with MyChart, go to [email or web address].    Your next appointment:   1 year(s)  The format for your next appointment:   In Person  Provider:   Kate Sable, MD   Other Instructions       Signed, Kate Sable, MD  12/17/2020 4:34 PM    [place]

## 2020-12-18 ENCOUNTER — Encounter: Payer: Self-pay | Admitting: Obstetrics and Gynecology

## 2020-12-18 ENCOUNTER — Other Ambulatory Visit: Payer: Self-pay

## 2020-12-18 ENCOUNTER — Ambulatory Visit (INDEPENDENT_AMBULATORY_CARE_PROVIDER_SITE_OTHER): Payer: Medicare Other | Admitting: Obstetrics and Gynecology

## 2020-12-18 VITALS — BP 116/69 | HR 76 | Ht 59.0 in | Wt 236.2 lb

## 2020-12-18 DIAGNOSIS — Z90710 Acquired absence of both cervix and uterus: Secondary | ICD-10-CM | POA: Diagnosis not present

## 2020-12-18 DIAGNOSIS — C541 Malignant neoplasm of endometrium: Secondary | ICD-10-CM

## 2020-12-18 NOTE — Progress Notes (Signed)
Pt present today for breast/pelvic/endometrial exam. Pt stated that she was doing well.

## 2020-12-18 NOTE — Progress Notes (Signed)
    GYNECOLOGY PROGRESS NOTE  Subjective:    Patient ID: MAXCINE STRONG, female    DOB: February 08, 1951, 69 y.o.   MRN: [id number]  HPI  Patient is a 69 y.o. G2P2002 female who presents for 6 month follow up of endometrial cancer.  She was diagnosed with endometrial cancer in September 2019 due to post-menopausal bleeding with pessary use for pelvic organ prolapse. She underwent a total laparoscopic hysterectomy with bilateral salpingo-oophorectomy with pelvic lymph node dissection on 10/06/2018.  She has undergone radiation and chemotherapy.   Final pathology showed: Endometrioid carcinoma FIGO grade 2 with 33% myometrial invasion.  Lymphovascular invasion present.  Peritoneal/ascites fluid atypical.  2 out of 3 sentinel lymph nodes were positive for macro metastases.  pT1a pN1a.  FIGO IIIC1.   Patient reports complaints of vulvar itching x 2 episodes, intermittent.    The following portions of the patient's history were reviewed and updated as appropriate: allergies, current medications, past family history, past medical history, past social history, past surgical history and problem list.  Review of Systems Pertinent items noted in HPI and remainder of comprehensive ROS otherwise negative.   Objective:   Blood pressure 116/69, pulse 76, height 4\' 11"  (1.499 m), weight 236 lb 3.2 oz (107.1 kg).  Body mass index is 47.71 kg/m.    General appearance: alert and no distress.  Abdomen: soft, non-tender; bowel sounds normal; no masses,  no organomegaly. Large well-healed scar across right mid-abdomen.  Pelvic: external genitalia normal.  Rectovaginal septum normal.  Vagina without discharge.  No lesions or nodularity noted. Uterus and cervix surgically absent. Rectum: Normal external sphincter, normal tone, no masses. Small amount of stool noted in rectal vault.  Lymph nodes: Cervical, supraclavicular, and inguinal nodes normal. Extremities: extremities normal, atraumatic, no cyanosis or  edema Neurologic: Grossly normal   Assessment:   - Endometrial cancer s/p LAVH/BSO with lymph node dissection. Is s/p radiation treatment and chemotherapy.  - Vulvar itching.   Plan:   - Doing well. No evidence of disease noted on today's exam. Is now out to yearly follow up alternating with GYN Oncology and GYN.  Also has recently seen Radiation Oncologist.  - Vulvar itching, no obvious lesions or skin changes. Advised on use of  Tea-tree or coconut oil externally as needed.    Rubie Maid, MD [facility]

## 2020-12-25 ENCOUNTER — Other Ambulatory Visit: Payer: Self-pay | Admitting: Family Medicine

## 2020-12-25 DIAGNOSIS — K429 Umbilical hernia without obstruction or gangrene: Secondary | ICD-10-CM | POA: Insufficient documentation

## 2020-12-25 DIAGNOSIS — F330 Major depressive disorder, recurrent, mild: Secondary | ICD-10-CM

## 2020-12-25 DIAGNOSIS — M170 Bilateral primary osteoarthritis of knee: Secondary | ICD-10-CM

## 2021-01-01 NOTE — Progress Notes (Signed)
Name: Molly [name]   MRN: [id number]    DOB: [birth year]   Date:01/01/2021       Progress Note  Subjective  Chief Complaint  Coughing and Wheezing   I connected with  [name]  on [id number] at  1:20 PM EST by telephone  application and verified that I am speaking with the correct person using two identifiers.  I discussed the limitations of evaluation and management by telemedicine and the availability of in person appointments. The patient expressed understanding and agreed to proceed with the virtual visit  Staff also discussed with the patient that there may be a patient responsible charge related to this service. Patient Location: at home  Provider Location: [facility] Additional Individuals present: alone   HPI  Cough: she went to [place] for Christmas, she drove home on 26 th of December. Symptoms started with a dry cough, sneezing , rhinorrhea. She states cough is getting progressive worse, and now productive, she also has post-nasal drainage  She has a history of asthma and has resumed using Albuterol to control the cough and wheezing. She denies SOB, no lower extremity edema. No fever or chills, denies diarrhea, nausea or vomiting. Denies lack of sense of taste of smell. She states taking Tylenol and Terraflu but feeling a little better since started albuterol on neb machine yesterday.    Patient Active Problem List   Diagnosis Date Noted  . 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 Iron deficiency anemia 11/15/2018  . Endometrial adenocarcinoma ([place]) 10/29/2018  . Umbilical hernia without obstruction and without gangrene   . Atherosclerosis of right coronary artery 11/10/2017  . Atherosclerosis of abdominal aorta ([place]) 11/10/2017  . Prediabetes 10/02/2017  . Pain in limb 03/31/2017  . Perennial allergic rhinitis with seasonal variation 05/14/2016  . Asthma, mild intermittent, well-controlled 05/14/2016  .  Hypertension goal BP (blood pressure) < 140/90 12/10/2015  . Cardiomyopathy due to hypertension ([place]) 12/10/2015  . History of CVA (cerebrovascular accident) 12/10/2015  . Hyperlipidemia LDL goal <70 12/10/2015  . Statin intolerance 12/10/2015    Past Surgical History:  Procedure Laterality Date  . ABDOMINAL HYSTERECTOMY    . CHOLECYSTECTOMY    . COLONOSCOPY WITH PROPOFOL N/A 01/08/2017   Procedure: COLONOSCOPY WITH PROPOFOL;  Surgeon: [name], MD;  Location: [facility] ENDOSCOPY;  Service: Endoscopy;  Laterality: N/A;  . COLONOSCOPY WITH PROPOFOL N/A 05/05/2018   Procedure: COLONOSCOPY WITH PROPOFOL;  Surgeon: [name], MD;  [facility] ENDOSCOPY;  Service: Gastroenterology;  Laterality: N/A;  . HERNIA REPAIR  [id number]   umbilical  . PORTACATH PLACEMENT N/A 11/04/2018   Procedure: INSERTION PORT-A-CATH;  Surgeon: Jules Husbands, MD;  Location: [facility] ORS;  Service: General;  Laterality: N/A;  . SENTINEL NODE BIOPSY N/A 10/06/2018   Procedure: SENTINEL NODE BIOPSY;  Surgeon: Mellody Drown, MD;  Location: [facility] ORS;  Service: Gynecology;  Laterality: N/A;  . UMBILICAL HERNIA REPAIR N/A 11/17/2017   Procedure: HERNIA REPAIR UMBILICAL ADULT;  Surgeon: Jules Husbands, MD;  Location: [facility] ORS;  Service: General;  Laterality: N/A;    Family History  Problem Relation Age of Onset  . Stroke Mother   . Hypertension Mother   . Dementia Mother   . Alzheimer's disease Mother   . Gout Father   . Asthma Father   . Hypertension Father   . Dementia Father   . Healthy Sister   . Stroke Brother   . Alzheimer's disease Brother   .  Healthy Daughter   . Hypertension Brother   . Healthy Brother   . Cancer Paternal Grandmother   . Healthy Sister   . Healthy Sister   . Hypertension Sister   . Hypertension Sister   . Stroke Brother   . Alzheimer's disease Brother   . Stroke Brother   . Hypertension Brother   . Stroke Brother   . Hypertension Brother   . Healthy Brother   . Healthy Brother    . Hypertension Daughter   . Breast cancer Neg Hx     Social History   Socioeconomic History  . Marital status: Divorced    Spouse name: Not on file  . Number of children: 2  . Years of education: some college  . Highest education level: 12th grade  Occupational History  . 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 Occupation: currently a Education officer, environmental  Tobacco Use  . Smoking status: Never Smoker  . Smokeless tobacco: Never Used  . Tobacco comment: smoking cessation materials not required  Vaping Use  . Vaping Use: Never used  Substance and Sexual Activity  . Alcohol use: No    Alcohol/week: 0.0 standard drinks  . Drug use: No  . Sexual activity: Not Currently  Other Topics Concern  . Not on file  Social History Narrative  . Not on file   Social Determinants of Health   Financial Resource Strain: Medium Risk  . Difficulty of Paying Living Expenses: Somewhat hard  Food Insecurity: No Food Insecurity  . Worried About Programme researcher, broadcasting/film/video in the Last Year: Never true  . Ran Out of Food in the Last Year: Never true  Transportation Needs: No Transportation Needs  . Lack of Transportation (Medical): No  . Lack of Transportation (Non-Medical): No  Physical Activity: Inactive  . Days of Exercise per Week: 0 days  . Minutes of Exercise per Session: 0 min  Stress: No Stress Concern Present  . Feeling of Stress : Not at all  Social Connections: Moderately Integrated  . Frequency of Communication with Friends and Family: More than three times a week  . Frequency of Social Gatherings with Friends and Family: Three times a week  . Attends Religious Services: More than 4 times per year  . Active Member of Clubs or Organizations: Yes  . Attends Banker Meetings: More than 4 times per year  . Marital Status: Divorced  Catering manager Violence: Not At Risk  . Fear of Current or Ex-Partner: No  . Emotionally Abused: No  . Physically Abused: No  .  Sexually Abused: No     Current Outpatient Medications:  .  acetaminophen (TYLENOL) 500 MG tablet, Take 2 tablets (1,000 mg total) by mouth every 6 (six) hours as needed for mild pain or moderate pain., Disp: 30 tablet, Rfl: 0 .  albuterol (VENTOLIN HFA) 108 (90 Base) MCG/ACT inhaler, Inhale 2 puffs into the lungs every 4 (four) hours as needed for wheezing or shortness of breath., Disp: 18 g, Rfl: 0 .  diclofenac Sodium (VOLTAREN) 1 % GEL, Apply 4 grams to the skin four times daily., Disp: 300 g, Rfl: 0 .  escitalopram (LEXAPRO) 10 MG tablet, Take 1 tablet by mouth daily., Disp: 90 tablet, Rfl: 0 .  Evolocumab (REPATHA) 140 MG/ML SOSY, Inject 420 mg into the skin every 30 (thirty) days., Disp: 4.2 mL, Rfl: 5 .  ezetimibe (ZETIA) 10 MG tablet, Take 1 tablet (10 mg total)  by mouth daily., Disp: 90 tablet, Rfl: 1 .  furosemide (LASIX) 40 MG tablet, Take 1 tablet by mouth daily. (Patient taking differently: as needed.), Disp: 30 tablet, Rfl: 0 .  ketoconazole (NIZORAL) 2 % cream, Apply 1 application topically daily as needed for irritation. On abdominal fold prn, Disp: 60 g, Rfl: 0 .  levocetirizine (XYZAL) 5 MG tablet, Take 1 tablet by mouth every evening., Disp: 90 tablet, Rfl: 3 .  olmesartan-hydrochlorothiazide (BENICAR HCT) 20-12.5 MG tablet, Take 1 tablet by mouth daily., Disp: 90 tablet, Rfl: 1 .  pantoprazole (PROTONIX) 20 MG tablet, Take 1 tablet by mouth daily. (Patient taking differently: Take 20 mg by mouth daily as needed.), Disp: 90 tablet, Rfl: 3 .  potassium chloride SA (KLOR-CON) 20 MEQ tablet, Take 1 tablet by mouth daily., Disp: 90 tablet, Rfl: 3 .  pregabalin (LYRICA) 100 MG capsule, Take 1 capsule (100 mg total) by mouth 2 (two) times daily., Disp: 180 capsule, Rfl: 1  Allergies  Allergen Reactions  . Ferumoxytol Anaphylaxis  . Nsaids Hives, Rash and Nausea And Vomiting  . Liraglutide Other (See Comments) and Nausea And Vomiting    pancreatitis  . Statins Other (See  Comments) and Nausea And Vomiting    Joint pains  . Oxycodone Nausea And Vomiting  . Crestor [Rosuvastatin Calcium] Rash    I personally reviewed active problem list, medication list, allergies, family history, social history, health maintenance with the patient/caregiver today.   ROS  Ten systems reviewed and is negative except as mentioned in HPI   Objective  Virtual encounter, vitals not obtained.  There is no height or weight on file to calculate BMI.  Physical Exam  Awake, alert and oriented  PHQ2/9: Depression screen [facility] 2/9 11/07/2020 10/15/2020 05/03/2020 04/13/2020 09/08/2019  Decreased Interest 0 0 0 0 0  Down, Depressed, Hopeless 0 0 0 0 0  PHQ - 2 Score 0 0 0 0 0  Altered sleeping 0 0 - 0 0  Tired, decreased energy 0 0 - 0 0  Change in appetite 0 0 - 0 0  Feeling bad or failure about yourself  0 0 - 0 0  Trouble concentrating 0 0 - 0 0  Moving slowly or fidgety/restless 0 0 - 0 0  Suicidal thoughts 0 0 - 0 0  PHQ-9 Score 0 0 - 0 0  Some recent data might be hidden   PHQ-2/9 Result is negative.    Fall Risk: Fall Risk  11/07/2020 10/15/2020 05/03/2020 04/13/2020 09/08/2019  Falls in the past year? 1 0 1 0 0  Number falls in past yr: 1 0 1 1 0  Injury with Fall? 0 0 1 1 0  Risk for fall due to : - - History of fall(s);Orthopedic patient History of fall(s);Orthopedic patient -  Risk for fall due to: Comment - - - - -  Follow up - - Falls prevention discussed Education provided -     Assessment & Plan  1. Bronchitis  Discussed mucinex, fluids, rest, test for COVId-19, monitor pulse ox if possible  2. Mild intermittent asthma with acute exacerbation  We will give her prednisone, if covid -19 negative and not improving we will call in antibiotics also  3. Cough  - Novel Coronavirus, NAA ([facility])  I discussed the assessment and treatment plan with the patient. The patient was provided an opportunity to ask questions and all were answered. The patient  agreed with the plan and demonstrated an understanding of the instructions.  The patient was advised to call back or seek an in-person evaluation if the symptoms worsen or if the condition fails to improve as anticipated.  I provided 25 minutes of non-face-to-face time during this encounter.

## 2021-01-02 ENCOUNTER — Encounter: Payer: Self-pay | Admitting: Family Medicine

## 2021-01-02 ENCOUNTER — Telehealth (INDEPENDENT_AMBULATORY_CARE_PROVIDER_SITE_OTHER): Payer: Medicare Other | Admitting: Family Medicine

## 2021-01-02 VITALS — Ht 59.0 in | Wt 236.0 lb

## 2021-01-02 DIAGNOSIS — J4521 Mild intermittent asthma with (acute) exacerbation: Secondary | ICD-10-CM | POA: Diagnosis not present

## 2021-01-02 DIAGNOSIS — J40 Bronchitis, not specified as acute or chronic: Secondary | ICD-10-CM

## 2021-01-02 DIAGNOSIS — R059 Cough, unspecified: Secondary | ICD-10-CM

## 2021-01-02 MED ORDER — PREDNISONE 10 MG PO TABS: 10.0000 mg | ORAL_TABLET | Freq: Two times a day (BID) | ORAL | 0 refills | Status: DC

## 2021-01-02 MED ORDER — BENZONATATE 100 MG PO CAPS
100.0000 mg | ORAL_CAPSULE | Freq: Two times a day (BID) | ORAL | 0 refills | Status: DC | PRN
Start: 2021-01-02 — End: 2021-03-28

## 2021-01-05 LAB — NOVEL CORONAVIRUS, NAA: SARS-CoV-2, NAA: NOT DETECTED

## 2021-01-09 ENCOUNTER — Telehealth: Payer: Self-pay

## 2021-01-09 ENCOUNTER — Other Ambulatory Visit: Payer: Self-pay | Admitting: Family Medicine

## 2021-01-09 DIAGNOSIS — R059 Cough, unspecified: Secondary | ICD-10-CM

## 2021-01-09 MED ORDER — FLOVENT HFA 220 MCG/ACT IN AERO: 2.0000 | INHALATION_SPRAY | Freq: Two times a day (BID) | RESPIRATORY_TRACT | 0 refills | Status: DC

## 2021-01-09 NOTE — Telephone Encounter (Signed)
Pt was notified of message

## 2021-01-09 NOTE — Telephone Encounter (Signed)
Pt still have cough and did test negative for covid. She would like to know if you'd give her a prescription for flovent HFA inhaler. Please send to [facility].

## 2021-01-10 ENCOUNTER — Ambulatory Visit
Admission: RE | Admit: 2021-01-10 | Discharge: 2021-01-10 | Disposition: A | Discharge status: Home / Self Care | Payer: Medicare Other | Source: Ambulatory Visit | Attending: Family Medicine | Admitting: Family Medicine

## 2021-01-10 ENCOUNTER — Ambulatory Visit
Admission: RE | Admit: 2021-01-10 | Discharge: 2021-01-10 | Disposition: A | Discharge status: Home / Self Care | Payer: Medicare Other | Attending: Family Medicine | Admitting: Family Medicine

## 2021-01-10 ENCOUNTER — Other Ambulatory Visit: Payer: Self-pay

## 2021-01-10 DIAGNOSIS — R059 Cough, unspecified: Secondary | ICD-10-CM | POA: Diagnosis not present

## 2021-01-10 DIAGNOSIS — J45909 Unspecified asthma, uncomplicated: Secondary | ICD-10-CM | POA: Diagnosis not present

## 2021-01-15 ENCOUNTER — Inpatient Hospital Stay: Payer: Medicare Other

## 2021-01-15 ENCOUNTER — Inpatient Hospital Stay: Payer: Medicare Other | Admitting: Oncology

## 2021-01-21 ENCOUNTER — Other Ambulatory Visit: Payer: Self-pay | Admitting: Family Medicine

## 2021-01-21 DIAGNOSIS — M170 Bilateral primary osteoarthritis of knee: Secondary | ICD-10-CM

## 2021-01-29 ENCOUNTER — Inpatient Hospital Stay (HOSPITAL_BASED_OUTPATIENT_CLINIC_OR_DEPARTMENT_OTHER): Payer: Medicare Other | Admitting: Oncology

## 2021-01-29 ENCOUNTER — Inpatient Hospital Stay: Payer: Medicare Other | Attending: Oncology

## 2021-01-29 VITALS — BP 136/75 | HR 63 | Temp 96.3°F | Resp 18 | Wt 231.7 lb

## 2021-01-29 DIAGNOSIS — F419 Anxiety disorder, unspecified: Secondary | ICD-10-CM | POA: Diagnosis not present

## 2021-01-29 DIAGNOSIS — T451X5D Adverse effect of antineoplastic and immunosuppressive drugs, subsequent encounter: Secondary | ICD-10-CM | POA: Insufficient documentation

## 2021-01-29 DIAGNOSIS — E785 Hyperlipidemia, unspecified: Secondary | ICD-10-CM | POA: Insufficient documentation

## 2021-01-29 DIAGNOSIS — Z08 Encounter for follow-up examination after completed treatment for malignant neoplasm: Secondary | ICD-10-CM | POA: Diagnosis not present

## 2021-01-29 DIAGNOSIS — Z8542 Personal history of malignant neoplasm of other parts of uterus: Secondary | ICD-10-CM | POA: Diagnosis present

## 2021-01-29 DIAGNOSIS — I10 Essential (primary) hypertension: Secondary | ICD-10-CM | POA: Insufficient documentation

## 2021-01-29 DIAGNOSIS — Z7951 Long term (current) use of inhaled steroids: Secondary | ICD-10-CM | POA: Diagnosis not present

## 2021-01-29 DIAGNOSIS — Z9049 Acquired absence of other specified parts of digestive tract: Secondary | ICD-10-CM | POA: Insufficient documentation

## 2021-01-29 DIAGNOSIS — G620 Drug-induced polyneuropathy: Secondary | ICD-10-CM | POA: Insufficient documentation

## 2021-01-29 DIAGNOSIS — Z9221 Personal history of antineoplastic chemotherapy: Secondary | ICD-10-CM | POA: Insufficient documentation

## 2021-01-29 DIAGNOSIS — Z79899 Other long term (current) drug therapy: Secondary | ICD-10-CM | POA: Diagnosis not present

## 2021-01-29 DIAGNOSIS — Z9079 Acquired absence of other genital organ(s): Secondary | ICD-10-CM | POA: Diagnosis not present

## 2021-01-29 DIAGNOSIS — Z923 Personal history of irradiation: Secondary | ICD-10-CM | POA: Diagnosis not present

## 2021-01-29 DIAGNOSIS — Z8249 Family history of ischemic heart disease and other diseases of the circulatory system: Secondary | ICD-10-CM | POA: Diagnosis not present

## 2021-01-29 DIAGNOSIS — Z8673 Personal history of transient ischemic attack (TIA), and cerebral infarction without residual deficits: Secondary | ICD-10-CM | POA: Diagnosis not present

## 2021-01-29 DIAGNOSIS — Z90710 Acquired absence of both cervix and uterus: Secondary | ICD-10-CM | POA: Diagnosis not present

## 2021-01-29 DIAGNOSIS — Z8616 Personal history of COVID-19: Secondary | ICD-10-CM | POA: Diagnosis not present

## 2021-01-29 DIAGNOSIS — K219 Gastro-esophageal reflux disease without esophagitis: Secondary | ICD-10-CM | POA: Diagnosis not present

## 2021-01-29 DIAGNOSIS — J45909 Unspecified asthma, uncomplicated: Secondary | ICD-10-CM | POA: Insufficient documentation

## 2021-01-29 DIAGNOSIS — D638 Anemia in other chronic diseases classified elsewhere: Secondary | ICD-10-CM | POA: Diagnosis not present

## 2021-01-29 DIAGNOSIS — Z90722 Acquired absence of ovaries, bilateral: Secondary | ICD-10-CM | POA: Diagnosis not present

## 2021-01-29 DIAGNOSIS — F32A Depression, unspecified: Secondary | ICD-10-CM | POA: Diagnosis not present

## 2021-01-29 LAB — COMPREHENSIVE METABOLIC PANEL
ALT: 19 U/L (ref 0–44)
AST: 23 U/L (ref 15–41)
Albumin: 4.1 g/dL (ref 3.5–5.0)
Alkaline Phosphatase: 77 U/L (ref 38–126)
Anion gap: 8 (ref 5–15)
BUN: 14 mg/dL (ref 8–23)
CO2: 31 mmol/L (ref 22–32)
Calcium: 9.6 mg/dL (ref 8.9–10.3)
Chloride: 100 mmol/L (ref 98–111)
Creatinine, Ser: 0.79 mg/dL (ref 0.44–1.00)
GFR, Estimated: >60 mL/min (ref >60)
Glucose, Bld: 109 mg/dL — ABNORMAL HIGH (ref 70–99)
Potassium: 3.8 mmol/L (ref 3.5–5.1)
Sodium: 139 mmol/L (ref 135–145)
Total Bilirubin: 0.7 mg/dL (ref 0.3–1.2)
Total Protein: 7.4 g/dL (ref 6.5–8.1)

## 2021-01-29 LAB — CBC WITH DIFFERENTIAL/PLATELET
Abs Immature Granulocytes: 0.02 10*3/uL (ref 0.00–0.07)
Basophils Absolute: 0 10*3/uL (ref 0.0–0.1)
Basophils Relative: 0 %
Eosinophils Absolute: 0.1 10*3/uL (ref 0.0–0.5)
Eosinophils Relative: 1 %
HCT: 35.7 % — ABNORMAL LOW (ref 36.0–46.0)
Hemoglobin: 12 g/dL (ref 12.0–15.0)
Immature Granulocytes: 0 %
Lymphocytes Relative: 32 %
Lymphs Abs: 1.7 10*3/uL (ref 0.7–4.0)
MCH: 30.6 pg (ref 26.0–34.0)
MCHC: 33.6 g/dL (ref 30.0–36.0)
MCV: 91.1 fL (ref 80.0–100.0)
Monocytes Absolute: 0.5 10*3/uL (ref 0.1–1.0)
Monocytes Relative: 9 %
Neutro Abs: 3 10*3/uL (ref 1.7–7.7)
Neutrophils Relative %: 58 %
Platelets: 275 10*3/uL (ref 150–400)
RBC: 3.92 MIL/uL (ref 3.87–5.11)
RDW: 13.5 % (ref 11.5–15.5)
WBC: 5.3 10*3/uL (ref 4.0–10.5)
nRBC: 0 % (ref 0.0–0.2)

## 2021-01-29 NOTE — Progress Notes (Signed)
Hematology/Oncology Consult note [facility]  [phone] [phone]  Patient Care Team: Steele Sizer, MD as PCP - General (Family Medicine) Kate Sable, MD as PCP - Cardiology (Cardiology) Rubie Maid, MD as Consulting Physician (Obstetrics and Gynecology) Clent Jacks, RN as Registered Nurse Noreene Filbert, MD as Referring Physician (Radiation Oncology) Sindy Guadeloupe, MD as Consulting Physician (Oncology) Mellody Drown, MD as Referring Physician (Obstetrics)   Name of the patient: Molly [name]  [id number]  May 12, 1951   Date of visit: 01/29/21  Diagnosis- 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 Marcelline Mates for cystocele and rectocele. She was noted to have postmenopausal bleeding and cramping recently. Ultrasound revealed heterogeneous appearance of uterus with fibroids invading the endometrium. She underwent an endometrial biopsy when she had a second bout of postmenopausal bleeding. Biopsy showed endometrioid adenocarcinoma FIGO grade 1. She was then seen by Dr. Fransisca Connors and plan was to proceed with laparoscopic hysterectomy and bilateral salpingo-oophorectomy along with pelvic lymph node sampling  Final pathology showed: Endometrioid carcinoma FIGO grade 2 with 33% myometrial invasion. Lymphovascular invasion present. Peritoneal/ascites fluid atypical. 2 out of 3 sentinel lymph nodes were positive for macro metastases. pT1a pN1a. FIGO IIIC1  Patient's case was discussed at tumor board and consensus was to proceed with 3 cycles of adjuvant carbotaxol followed by radiation followed by 3 more cycles of chemotherapy. Patient is already met with Dr. Donella Stade who will be giving radiation therapy to pelvic and  periaortic lymph nodes and boost to her vaginal cuff. PET CT scan showed metastatic adenopathy and retroperitoneal and external iliac group of lymph nodes in the right side. No evidence of metastatic disease elsewhere.  First cycle of chemotherapy was given on 11/05/2018.She completed 6 cycles on 02/25/2019.She completed whole pelvic radiation in June 2020.   Interval history-she has mild tingling numbness in her fingertips which is overall stable on Lyrica.  Appetite is good and she denies any abdominal pain.  Denies any vaginal bleeding or discharge  ECOG PS- 1 Pain scale- 0  Review of systems- Review of Systems  Constitutional: Positive for malaise/fatigue. Negative for chills, fever and weight loss.  HENT: Negative for congestion, ear discharge and nosebleeds.   Eyes: Negative for blurred vision.  Respiratory: Negative for cough, hemoptysis, sputum production, shortness of breath and wheezing.   Cardiovascular: Negative for chest pain, palpitations, orthopnea and claudication.  Gastrointestinal: Negative for abdominal pain, blood in stool, constipation, diarrhea, heartburn, melena, nausea and vomiting.  Genitourinary: Negative for dysuria, flank pain, frequency, hematuria and urgency.  Musculoskeletal: Negative for back pain, joint pain and myalgias.  Skin: Negative for rash.  Neurological: Positive for sensory change (Peripheral neuropathy). Negative for dizziness, tingling, focal weakness, seizures, weakness and headaches.  Endo/Heme/Allergies: Does not bruise/bleed easily.  Psychiatric/Behavioral: Negative for depression and suicidal ideas. The patient does not have insomnia.       Allergies  Allergen Reactions  . Ferumoxytol Anaphylaxis  . Nsaids Hives, Rash and Nausea And Vomiting  . Liraglutide Other (See Comments) and Nausea And Vomiting    pancreatitis  . Statins Other (See Comments) and Nausea And Vomiting    Joint pains  . Oxycodone Nausea And Vomiting  .  Crestor [Rosuvastatin Calcium] Rash     Past Medical History:  Diagnosis Date  . Anxiety   . Asthma  history of asthma  . Cardiomyopathy ([place])   . Complication of anesthesia    tore hair out and made teeth rough  . COVID-19 virus infection 09/08/2019  . Depression   . Endometrial cancer ([place]) 08/2018  . GERD (gastroesophageal reflux disease)    takes prilosec prn  . History of CVA (cerebrovascular accident) 12/10/2015  . Hyperlipidemia LDL goal <70 12/10/2015  . Hypertension   . Prediabetes 10/02/2017   A1c 6 in January 2018  . Stroke [facility]) 1990    no residual effects     Past Surgical History:  Procedure Laterality Date  . ABDOMINAL HYSTERECTOMY    . CHOLECYSTECTOMY    . COLONOSCOPY WITH PROPOFOL N/A 01/08/2017   Procedure: COLONOSCOPY WITH PROPOFOL;  Surgeon: [name], MD;  Location: [facility] ENDOSCOPY;  Service: Endoscopy;  Laterality: N/A;  . COLONOSCOPY WITH PROPOFOL N/A 05/05/2018   Procedure: COLONOSCOPY WITH PROPOFOL;  Surgeon: [name], MD;  Location[facility] ENDOSCOPY;  Service: Gastroenterology;  Laterality: N/A;  . HERNIA REPAIR  [id number]   umbilical  . PORTACATH PLACEMENT N/A 11/04/2018   Procedure: INSERTION PORT-A-CATH;  Surgeon: Jules Husbands, MD;  Location: [facility] ORS;  Service: General;  Laterality: N/A;  . SENTINEL NODE BIOPSY N/A 10/06/2018   Procedure: SENTINEL NODE BIOPSY;  Surgeon: Mellody Drown, MD;  Location: [facility] ORS;  Service: Gynecology;  Laterality: N/A;  . UMBILICAL HERNIA REPAIR N/A 11/17/2017   Procedure: HERNIA REPAIR UMBILICAL ADULT;  Surgeon: Jules Husbands, MD;  Location: [facility] ORS;  Service: General;  Laterality: N/A;    Social History   Socioeconomic History  . Marital status: Divorced    Spouse name: Not on file  . Number of children: 2  . Years of education: some college  . Highest education level: 12th grade  Occupational History  . 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 Occupation: currently a Theme park manager   Tobacco Use  . Smoking status: Never Smoker  . Smokeless tobacco: Never Used  . Tobacco comment: smoking cessation materials not required  Vaping Use  . Vaping Use: Never used  Substance and Sexual Activity  . Alcohol use: No    Alcohol/week: 0.0 standard drinks  . Drug use: No  . Sexual activity: Not Currently  Other Topics Concern  . Not on file  Social History Narrative  . Not on file   Social Determinants of Health   Financial Resource Strain: Medium Risk  . Difficulty of Paying Living Expenses: Somewhat hard  Food Insecurity: No Food Insecurity  . Worried About Charity fundraiser in the Last Year: Never true  . Ran Out of Food in the Last Year: Never true  Transportation Needs: No Transportation Needs  . Lack of Transportation (Medical): No  . Lack of Transportation (Non-Medical): No  Physical Activity: Inactive  . Days of Exercise per Week: 0 days  . Minutes of Exercise per Session: 0 min  Stress: No Stress Concern Present  . Feeling of Stress : Not at all  Social Connections: Moderately Integrated  . Frequency of Communication with Friends and Family: More than three times a week  . Frequency of Social Gatherings with Friends and Family: Three times a week  . Attends Religious Services: More than 4 times per year  . Active Member of Clubs or Organizations: Yes  . Attends Archivist Meetings: More than 4 times per year  . Marital Status: Divorced  Human resources officer Violence: Not At Risk  .  Fear of Current or Ex-Partner: No  . Emotionally Abused: No  . Physically Abused: No  . Sexually Abused: No    Family History  Problem Relation Age of Onset  . Stroke Mother   . Hypertension Mother   . Dementia Mother   . Alzheimer's disease Mother   . Gout Father   . Asthma Father   . Hypertension Father   . Dementia Father   . Healthy Sister   . Stroke Brother   . Alzheimer's disease Brother   . Healthy Daughter   . Hypertension Brother   . Healthy  Brother   . Cancer Paternal Grandmother   . Healthy Sister   . Healthy Sister   . Hypertension Sister   . Hypertension Sister   . Stroke Brother   . Alzheimer's disease Brother   . Stroke Brother   . Hypertension Brother   . Stroke Brother   . Hypertension Brother   . Healthy Brother   . Healthy Brother   . Hypertension Daughter   . Breast cancer Neg Hx      Current Outpatient Medications:  .  acetaminophen (TYLENOL) 500 MG tablet, Take 2 tablets (1,000 mg total) by mouth every 6 (six) hours as needed for mild pain or moderate pain., Disp: 30 tablet, Rfl: 0 .  albuterol (VENTOLIN HFA) 108 (90 Base) MCG/ACT inhaler, Inhale 2 puffs into the lungs every 4 (four) hours as needed for wheezing or shortness of breath., Disp: 18 g, Rfl: 0 .  benzonatate (TESSALON) 100 MG capsule, Take 1-2 capsules (100-200 mg total) by mouth 2 (two) times daily as needed., Disp: 40 capsule, Rfl: 0 .  clopidogrel (PLAVIX) 75 MG tablet, Take 75 mg by mouth daily., Disp: , Rfl:  .  diclofenac Sodium (VOLTAREN) 1 % GEL, Apply 4 grams to the skin four times daily., Disp: 300 g, Rfl: 0 .  escitalopram (LEXAPRO) 10 MG tablet, Take 1 tablet by mouth daily., Disp: 90 tablet, Rfl: 0 .  ezetimibe (ZETIA) 10 MG tablet, Take 1 tablet (10 mg total) by mouth daily., Disp: 90 tablet, Rfl: 1 .  fluticasone (FLOVENT HFA) 220 MCG/ACT inhaler, Inhale 2 puffs into the lungs in the morning and at bedtime., Disp: 1 each, Rfl: 0 .  furosemide (LASIX) 40 MG tablet, Take 1 tablet by mouth daily. (Patient taking differently: as needed.), Disp: 30 tablet, Rfl: 0 .  ketoconazole (NIZORAL) 2 % cream, Apply 1 application topically daily as needed for irritation. On abdominal fold prn, Disp: 60 g, Rfl: 0 .  levocetirizine (XYZAL) 5 MG tablet, Take 1 tablet by mouth every evening., Disp: 90 tablet, Rfl: 3 .  olmesartan-hydrochlorothiazide (BENICAR HCT) 20-12.5 MG tablet, Take 1 tablet by mouth daily., Disp: 90 tablet, Rfl: 1 .  pantoprazole  (PROTONIX) 20 MG tablet, Take 1 tablet by mouth daily. (Patient taking differently: Take 20 mg by mouth daily as needed.), Disp: 90 tablet, Rfl: 3 .  potassium chloride SA (KLOR-CON) 20 MEQ tablet, Take 1 tablet by mouth daily., Disp: 90 tablet, Rfl: 3 .  pregabalin (LYRICA) 100 MG capsule, Take 1 capsule (100 mg total) by mouth 2 (two) times daily., Disp: 180 capsule, Rfl: 1 .  Evolocumab (REPATHA) 140 MG/ML SOSY, Inject 420 mg into the skin every 30 (thirty) days. (Patient not taking: Reported on 01/29/2021), Disp: 4.2 mL, Rfl: 5  Physical exam:  Physical Exam Constitutional:      General: She is not in acute distress. Eyes:     Extraocular Movements:  EOM normal.  Cardiovascular:     Rate and Rhythm: Normal rate and regular rhythm.     Heart sounds: Normal heart sounds.  Pulmonary:     Effort: Pulmonary effort is normal.     Breath sounds: Normal breath sounds.  Abdominal:     General: Bowel sounds are normal.     Palpations: Abdomen is soft.  Skin:    General: Skin is warm and dry.  Neurological:     Mental Status: She is alert and oriented to person, place, and time.      CMP Latest Ref Rng & Units 01/29/2021  Glucose 70 - 99 mg/dL 109(H)  BUN 8 - 23 mg/dL 14  Creatinine 0.44 - 1.00 mg/dL 0.79  Sodium 135 - 145 mmol/L 139  Potassium 3.5 - 5.1 mmol/L 3.8  Chloride 98 - 111 mmol/L 100  CO2 22 - 32 mmol/L 31  Calcium 8.9 - 10.3 mg/dL 9.6  Total Protein 6.5 - 8.1 g/dL 7.4  Total Bilirubin 0.3 - 1.2 mg/dL 0.7  Alkaline Phos 38 - 126 U/L 77  AST 15 - 41 U/L 23  ALT 0 - 44 U/L 19   CBC Latest Ref Rng & Units 01/29/2021  WBC 4.0 - 10.5 K/uL 5.3  Hemoglobin 12.0 - 15.0 g/dL 12.0  Hematocrit 36.0 - 46.0 % 35.7(L)  Platelets 150 - 400 K/uL 275    No images are attached to the encounter.  DG Chest 2 View  Result Date: 01/10/2021 CLINICAL DATA:  Two week history of productive cough and chest congestion. Negative COVID-19 test 6 days ago, though patient had COVID-19 in September,  2020. Current history of asthma. EXAM: CHEST - 2 VIEW COMPARISON:  CT chest 07/05/2020. Chest x-rays 09/08/2019 and earlier. FINDINGS: Cardiomediastinal silhouette unremarkable and unchanged. Lungs clear. Bronchovascular markings normal. Pulmonary vascularity normal. No visible pleural effusions. No pneumothorax. Mild degenerative changes involving the mid thoracic spine. No interval change since 04/07/2018 with the exception of removal of a Port-A-Cath since the 09/08/2019 chest x-ray. IMPRESSION: No acute cardiopulmonary disease. Stable examination since 04/07/2018. Electronically Signed   By: Evangeline Dakin M.D.   On: 01/10/2021 13:03     Assessment and plan- Patient is a 70 y.o. female  with stage IIIc grade 2 endometrial carcinoma with bilateral positive pelvic sentinel lymph node s/p TLH/BSO with pelvic sentinel lymph node biopsies in October 2019 followed by adjuvant chemotherapy and radiation treatment.  She is here for routine follow-up of endometrial cancer  Clinically patient is doing well with no concerning signs and symptoms of recurrence based on today's exam.  She had a CT abdomen pelvis in November 2021 which did not show any evidence of recurrent disease.  CA-125 from today is pending.  She is also following up with GYN oncology and radiation oncology.  Chemo-induced peripheral neuropathy: Currently stable on Lyrica  I will see her back in 6 months with CBC with differential CMP and CA-125   Visit Diagnosis 1. Encounter for follow-up surveillance of endometrial cancer   2. Anemia of chronic disease      Dr. Randa Evens, MD, MPH [facility] [phone] 01/29/2021 12:35 PM

## 2021-01-30 LAB — CA 125: Cancer Antigen (CA) 125: 13.3 U/mL (ref 0.0–38.1)

## 2021-02-08 ENCOUNTER — Other Ambulatory Visit: Payer: Self-pay | Admitting: Family Medicine

## 2021-02-08 DIAGNOSIS — R059 Cough, unspecified: Secondary | ICD-10-CM

## 2021-02-27 ENCOUNTER — Inpatient Hospital Stay: Payer: Medicare Other | Attending: Obstetrics and Gynecology

## 2021-02-28 ENCOUNTER — Other Ambulatory Visit: Payer: Self-pay | Admitting: Family Medicine

## 2021-02-28 DIAGNOSIS — M170 Bilateral primary osteoarthritis of knee: Secondary | ICD-10-CM

## 2021-03-15 ENCOUNTER — Encounter: Payer: Self-pay | Admitting: Family Medicine

## 2021-03-19 NOTE — Progress Notes (Unsigned)
    SUBJECTIVE:   CHIEF COMPLAINT / HPI:   ARM PAIN - thought it was due to either zetia or plavix. so she stopped taking plavix about 2 weeks ago. Is still taking zetia.  - pain sometimes gets better but sometimes gets worse with movement. Duration: months Location: bilateral upper arms, R>L Mechanism of injury: unknown Onset: gradual Quality:  aching Frequency: constant but worse with pushing on it sometimes, sometimes pushing on it helps. Radiation: yes down arms. Spares hands. Aggravating factors: unsure  Alleviating factors: unknown Status: better Treatments attempted: tylenol arthritis, heat, ice, salonpas  Relief with NSAIDs?:  No NSAIDs Taken Swelling: no Redness: no  Warmth: no Trauma: no Chest pain: no  Shortness of breath: no  Fever: no Decreased sensation: has chronic neuropathy Paresthesias: has chronic neuropathy Weakness: yes  No neck pain, rashes.   Saw neuro today, started on ropinorole for RLS. Told neuro she had stopped taking zetia and was still taking plavix. Reports never got repatha but is on med list since 10/2020 with refills in pill pack. Does not have meds with her today. Reported allergy to ASA with arthalgias.  Per chart review, noted to have myalgias with plavix in December.  OBJECTIVE:   BP 114/66   Pulse 90   Temp 98.2 F (36.8 C) (Oral)   Resp 16   Ht 4\' 11"  (1.499 m)   Wt 233 lb 9.6 oz (106 kg)   SpO2 93%   BMI 47.18 kg/m   Gen: well appearing, in NAD Ext: WWP MSK: full PROM and AROM of bilateral arms though with pain, no resistance appreciated. Intact grip strength. 5/5 UE strength. Negative hawkins, spurlings. Some TTP of upper R arm. No overlying skin changes, swelling, redness appreciated.    ASSESSMENT/PLAN:   Myalgia Noted to b/l upper arms, R>L. Intact strength and ROM testing today without notable skin abnormalities.  May have been related to plavix although d/ced by patient 2 weeks ago and <1% postmarketing reported  myalgias, though with similar reports by patient in the past with plavix and ASA. Will have patient continue to withhold for now. Will also obtain labs to r/o autoimmune causes and f/u in 4-6 weeks if pain continues.   Medication management Noted confusion by patient what she was supposed to be taking. Did not have pill pack with her today. Also challenging given multiple reported adverse effects from different medications. Will place CCM referral for pharmacy help with med management.    Myles Gip, DO

## 2021-03-20 ENCOUNTER — Other Ambulatory Visit: Payer: Self-pay

## 2021-03-20 ENCOUNTER — Encounter: Payer: Self-pay | Admitting: Family Medicine

## 2021-03-20 ENCOUNTER — Ambulatory Visit (INDEPENDENT_AMBULATORY_CARE_PROVIDER_SITE_OTHER): Payer: Medicare Other | Admitting: Family Medicine

## 2021-03-20 VITALS — BP 114/66 | HR 90 | Temp 98.2°F | Resp 16 | Ht 59.0 in | Wt 233.6 lb

## 2021-03-20 DIAGNOSIS — Z79899 Other long term (current) drug therapy: Secondary | ICD-10-CM | POA: Diagnosis not present

## 2021-03-20 DIAGNOSIS — R569 Unspecified convulsions: Secondary | ICD-10-CM | POA: Diagnosis not present

## 2021-03-20 DIAGNOSIS — M791 Myalgia, unspecified site: Secondary | ICD-10-CM

## 2021-03-20 DIAGNOSIS — G2581 Restless legs syndrome: Secondary | ICD-10-CM | POA: Diagnosis not present

## 2021-03-20 NOTE — Patient Instructions (Signed)
It was great to see you!  Our plans for today:  - Call your neurologist to let them know you are not taking plavix. I will remove this from your list.  - A member of our pharmacy team will reach out to you regarding your medications. - You can keep taking tylenol for your arm pain. When you have the pain, try using a heating pad and stretches.   We are checking some labs today, we will release these results to your MyChart.  Take care and seek immediate care sooner if you develop any concerns.   Dr. Ky Barban

## 2021-03-20 NOTE — Assessment & Plan Note (Signed)
Noted confusion by patient what she was supposed to be taking. Did not have pill pack with her today. Also challenging given multiple reported adverse effects from different medications. Will place CCM referral for pharmacy help with med management.

## 2021-03-20 NOTE — Assessment & Plan Note (Signed)
Noted to b/l upper arms, R>L. Intact strength and ROM testing today without notable skin abnormalities.  May have been related to plavix although d/ced by patient 2 weeks ago and <1% postmarketing reported myalgias, though with similar reports by patient in the past with plavix and ASA. Will have patient continue to withhold for now. Will also obtain labs to r/o autoimmune causes and f/u in 4-6 weeks if pain continues.

## 2021-03-21 ENCOUNTER — Telehealth: Payer: Self-pay | Admitting: *Deleted

## 2021-03-21 NOTE — Chronic Care Management (AMB) (Signed)
  Chronic Care Management   Note  07/12/2319 Name: Molly [name] MRN: [id number] DOB: [birth year]  Molly [name] is a 71 y.o. year old female who is a primary care patient of Steele Sizer, MD. I reached out to [name] by phone today in response to a referral sent by Ms. Hector Shade Morfin's PCP, Dr. Ancil Boozer.      Ms. Nielson was given information about Chronic Care Management services today including:  1. CCM service includes personalized support from designated clinical staff supervised by her physician, including individualized plan of care and coordination with other care providers 2. 24/7 contact phone numbers for assistance for urgent and routine care needs. 3. Service will only be billed when office clinical staff spend 20 minutes or more in a month to coordinate care. 4. Only one practitioner may furnish and bill the service in a calendar month. 5. The patient may stop CCM services at any time (effective at the end of the month) by phone call to the office staff. 6. The patient will be responsible for cost sharing (co-pay) of up to 20% of the service fee (after annual deductible is met).  Patient agreed to services and verbal consent obtained.   Follow up plan: Telephone appointment with care management team member scheduled for:04/03/2021  [place] [facility]

## 2021-03-25 LAB — RHEUMATOID FACTOR: Rheumatoid fact SerPl-aCnc: <14 IU/mL (ref <14)

## 2021-03-25 LAB — CYCLIC CITRUL PEPTIDE ANTIBODY, IGG: Cyclic Citrullin Peptide Ab: <16 UNITS

## 2021-03-25 LAB — C-REACTIVE PROTEIN: CRP: 40.5 mg/L — ABNORMAL HIGH (ref <8.0)

## 2021-03-25 LAB — CK: Total CK: 143 U/L (ref 29–143)

## 2021-03-25 LAB — ANA: Anti Nuclear Antibody (ANA): NEGATIVE

## 2021-03-25 LAB — SEDIMENTATION RATE: Sed Rate: 33 mm/h — ABNORMAL HIGH (ref 0–30)

## 2021-03-27 ENCOUNTER — Inpatient Hospital Stay: Payer: Medicare Other

## 2021-03-27 NOTE — Progress Notes (Signed)
deleted

## 2021-03-28 ENCOUNTER — Telehealth (INDEPENDENT_AMBULATORY_CARE_PROVIDER_SITE_OTHER): Payer: Medicare Other | Admitting: Family Medicine

## 2021-03-28 ENCOUNTER — Telehealth: Payer: Self-pay | Admitting: *Deleted

## 2021-03-28 ENCOUNTER — Encounter: Payer: Self-pay | Admitting: Family Medicine

## 2021-03-28 DIAGNOSIS — J069 Acute upper respiratory infection, unspecified: Secondary | ICD-10-CM

## 2021-03-28 DIAGNOSIS — E7800 Pure hypercholesterolemia, unspecified: Secondary | ICD-10-CM

## 2021-03-28 DIAGNOSIS — J4521 Mild intermittent asthma with (acute) exacerbation: Secondary | ICD-10-CM

## 2021-03-28 MED ORDER — ALBUTEROL SULFATE HFA 108 (90 BASE) MCG/ACT IN AERS
2.0000 | INHALATION_SPRAY | RESPIRATORY_TRACT | 0 refills | Status: DC | PRN
Start: 2021-03-28 — End: 2021-04-10

## 2021-03-28 MED ORDER — BREO ELLIPTA 100-25 MCG/INH IN AEPB: 1.0000 | INHALATION_SPRAY | Freq: Every day | RESPIRATORY_TRACT | 0 refills | Status: DC

## 2021-03-28 MED ORDER — BENZONATATE 100 MG PO CAPS: 100.0000 mg | ORAL_CAPSULE | Freq: Two times a day (BID) | ORAL | 0 refills | Status: DC | PRN

## 2021-03-28 NOTE — Progress Notes (Signed)
Name: Molly [name]   MRN: [id number]    DOB: 06-29-1951   Date:03/28/2021       Progress Note  Subjective  Chief Complaint  Chief Complaint  Patient presents with  . Cough    X1 week  . Wheezing    I connected with  [name]  on 09/62/83 at 12:45 PM EDT by telephone and verified that I am speaking with the correct person using two identifiers.  I discussed the limitations of evaluation and management by telemedicine and the availability of in person appointments. The patient expressed understanding and agreed to proceed with the virtual visit  Staff also discussed with the patient that there may be a patient responsible charge related to this service. Patient Location: at home  Provider Location: [facility] Additional Individuals present: her daughter   HPI  URI: she states symptoms started 8 days ago. Initially rhinorrhea and sneezing, followed by wheezing and a wet  cough. No SOB, fever or chills. She has low appetite. She states she is feeling better but not resolving. She has asthma, she asked for prednisone, but explained since she has been out of [place] and is feeling better on her own, I prefer resuming asthma medication, cough medication and if no improvement contact us early next week   Patient Active Problem List   Diagnosis Date Noted  . Myalgia 03/20/2021  . Medication management 03/20/2021  . Umbilical hernia without obstruction and without gangrene 12/25/2020  . 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 Iron deficiency anemia 11/15/2018  . Endometrial adenocarcinoma ([place]) 10/29/2018  . Umbilical hernia without obstruction and without gangrene   . Atherosclerosis of right coronary artery 11/10/2017  . Atherosclerosis of abdominal aorta ([place]) 11/10/2017  . Prediabetes 10/02/2017  . Pain in limb 03/31/2017  . Perennial allergic rhinitis with seasonal variation 05/14/2016  . Asthma, mild intermittent,  well-controlled 05/14/2016  . Hypertension goal BP (blood pressure) < 140/90 12/10/2015  . Cardiomyopathy due to hypertension ([place]) 12/10/2015  . History of CVA (cerebrovascular accident) 12/10/2015  . Hyperlipidemia LDL goal <70 12/10/2015  . Statin intolerance 12/10/2015    Past Surgical History:  Procedure Laterality Date  . ABDOMINAL HYSTERECTOMY    . CHOLECYSTECTOMY    . COLONOSCOPY WITH PROPOFOL N/A 01/08/2017   Procedure: COLONOSCOPY WITH PROPOFOL;  Surgeon: [name], MD;  Location: [facility] ENDOSCOPY;  Service: Endoscopy;  Laterality: N/A;  . COLONOSCOPY WITH PROPOFOL N/A 05/05/2018   Procedure: COLONOSCOPY WITH PROPOFOL;  Surgeon: [name], MD;  Location: [facility] ENDOSCOPY;  Service: Gastroenterology;  Laterality: N/A;  . HERNIA REPAIR  [id number]   umbilical  . PORTACATH PLACEMENT N/A 11/04/2018   Procedure: INSERTION PORT-A-CATH;  Surgeon: Jules Husbands, MD;  Location: [facility] ORS;  Service: General;  Laterality: N/A;  . SENTINEL NODE BIOPSY N/A 10/06/2018   Procedure: SENTINEL NODE BIOPSY;  Surgeon: Mellody Drown, MD;  Location: [facility] ORS;  Service: Gynecology;  Laterality: N/A;  . UMBILICAL HERNIA REPAIR N/A 11/17/2017   Procedure: HERNIA REPAIR UMBILICAL ADULT;  Surgeon: Jules Husbands, MD;  Location: [facility] ORS;  Service: General;  Laterality: N/A;    Family History  Problem Relation Age of Onset  . Stroke Mother   . Hypertension Mother   . Dementia Mother   . Alzheimer's disease Mother   . Gout Father   . Asthma Father   . Hypertension Father   . Dementia Father   . Healthy Sister   .  Stroke Brother   . Alzheimer's disease Brother   . Healthy Daughter   . Hypertension Brother   . Healthy Brother   . Cancer Paternal Grandmother   . Healthy Sister   . Healthy Sister   . Hypertension Sister   . Hypertension Sister   . Stroke Brother   . Alzheimer's disease Brother   . Stroke Brother   . Hypertension Brother   . Stroke Brother   . Hypertension Brother   . Healthy  Brother   . Healthy Brother   . Hypertension Daughter   . Breast cancer Neg Hx     Social History   Socioeconomic History  . Marital status: Divorced    Spouse name: Not on file  . Number of children: 2  . Years of education: some college  . Highest education level: 12th grade  Occupational History  . 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 Occupation: currently a Theme park manager  Tobacco Use  . Smoking status: Never Smoker  . Smokeless tobacco: Never Used  . Tobacco comment: smoking cessation materials not required  Vaping Use  . Vaping Use: Never used  Substance and Sexual Activity  . Alcohol use: No    Alcohol/week: 0.0 standard drinks  . Drug use: No  . Sexual activity: Not Currently  Other Topics Concern  . Not on file  Social History Narrative  . Not on file   Social Determinants of Health   Financial Resource Strain: Medium Risk  . Difficulty of Paying Living Expenses: Somewhat hard  Food Insecurity: No Food Insecurity  . Worried About Charity fundraiser in the Last Year: Never true  . Ran Out of Food in the Last Year: Never true  Transportation Needs: No Transportation Needs  . Lack of Transportation (Medical): No  . Lack of Transportation (Non-Medical): No  Physical Activity: Inactive  . Days of Exercise per Week: 0 days  . Minutes of Exercise per Session: 0 min  Stress: No Stress Concern Present  . Feeling of Stress : Not at all  Social Connections: Moderately Integrated  . Frequency of Communication with Friends and Family: More than three times a week  . Frequency of Social Gatherings with Friends and Family: Three times a week  . Attends Religious Services: More than 4 times per year  . Active Member of Clubs or Organizations: Yes  . Attends Archivist Meetings: More than 4 times per year  . Marital Status: Divorced  Human resources officer Violence: Not At Risk  . Fear of Current or Ex-Partner: No  . Emotionally Abused: No  .  Physically Abused: No  . Sexually Abused: No     Current Outpatient Medications:  .  acetaminophen (TYLENOL) 500 MG tablet, Take 2 tablets (1,000 mg total) by mouth every 6 (six) hours as needed for mild pain or moderate pain., Disp: 30 tablet, Rfl: 0 .  diclofenac Sodium (VOLTAREN) 1 % GEL, Apply 4 grams to the skin four times daily., Disp: 300 g, Rfl: 0 .  escitalopram (LEXAPRO) 10 MG tablet, Take 1 tablet by mouth daily., Disp: 90 tablet, Rfl: 0 .  ezetimibe (ZETIA) 10 MG tablet, Take 1 tablet (10 mg total) by mouth daily., Disp: 90 tablet, Rfl: 1 .  fluticasone furoate-vilanterol (BREO ELLIPTA) 100-25 MCG/INH AEPB, Inhale 1 puff into the lungs daily., Disp: 60 each, Rfl: 0 .  furosemide (LASIX) 40 MG tablet, Take 1 tablet by mouth daily. (Patient taking  differently: as needed.), Disp: 30 tablet, Rfl: 0 .  ketoconazole (NIZORAL) 2 % cream, Apply 1 application topically daily as needed for irritation. On abdominal fold prn, Disp: 60 g, Rfl: 0 .  levocetirizine (XYZAL) 5 MG tablet, Take 1 tablet by mouth every evening., Disp: 90 tablet, Rfl: 3 .  olmesartan-hydrochlorothiazide (BENICAR HCT) 20-12.5 MG tablet, Take 1 tablet by mouth daily., Disp: 90 tablet, Rfl: 1 .  pantoprazole (PROTONIX) 20 MG tablet, Take 1 tablet by mouth daily. (Patient taking differently: Take 20 mg by mouth daily as needed.), Disp: 90 tablet, Rfl: 3 .  potassium chloride SA (KLOR-CON) 20 MEQ tablet, Take 1 tablet by mouth daily., Disp: 90 tablet, Rfl: 3 .  pregabalin (LYRICA) 100 MG capsule, Take 1 capsule (100 mg total) by mouth 2 (two) times daily., Disp: 180 capsule, Rfl: 1 .  albuterol (VENTOLIN HFA) 108 (90 Base) MCG/ACT inhaler, Inhale 2 puffs into the lungs every 4 (four) hours as needed for wheezing or shortness of breath., Disp: 18 g, Rfl: 0 .  benzonatate (TESSALON) 100 MG capsule, Take 1-2 capsules (100-200 mg total) by mouth 2 (two) times daily as needed., Disp: 40 capsule, Rfl: 0 .  Evolocumab (REPATHA) 140  MG/ML SOSY, Inject 420 mg into the skin every 30 (thirty) days. (Patient not taking: No sig reported), Disp: 4.2 mL, Rfl: 5  Allergies  Allergen Reactions  . Ferumoxytol Anaphylaxis  . Nsaids Hives, Rash and Nausea And Vomiting  . Liraglutide Other (See Comments) and Nausea And Vomiting    pancreatitis  . Statins Other (See Comments) and Nausea And Vomiting    Joint pains  . Oxycodone Nausea And Vomiting  . Crestor [Rosuvastatin Calcium] Rash    I personally reviewed active problem list, medication list, allergies, family history, social history with the patient/caregiver today.   ROS  Ten systems reviewed and is negative except as mentioned in HPI   Objective  Virtual encounter, vitals not obtained.  There is no height or weight on file to calculate BMI.  Physical Exam  Awake, alert and oriented, wet cough, speaking in full sentences   PHQ2/9: Depression screen [facility] 2/9 03/28/2021 03/20/2021 01/02/2021 11/07/2020 10/15/2020  Decreased Interest 0 0 0 0 0  Down, Depressed, Hopeless 0 0 0 0 0  PHQ - 2 Score 0 0 0 0 0  Altered sleeping - - 0 0 0  Tired, decreased energy - - 0 0 0  Change in appetite - - 0 0 0  Feeling bad or failure about yourself  - - 0 0 0  Trouble concentrating - - 0 0 0  Moving slowly or fidgety/restless - - 0 0 0  Suicidal thoughts - - 0 0 0  PHQ-9 Score - - 0 0 0  Some recent data might be hidden   PHQ-2/9 Result is negative.    Fall Risk: Fall Risk  03/28/2021 03/20/2021 01/02/2021 11/07/2020 10/15/2020  Falls in the past year? 0 0 1 1 0  Number falls in past yr: 0 0 1 1 0  Injury with Fall? 0 0 0 0 0  Risk for fall due to : - - History of fall(s) - -  Risk for fall due to: Comment - - - - -  Follow up - - - - -    Assessment & Plan  1. Viral upper respiratory tract infection  - benzonatate (TESSALON) 100 MG capsule; Take 1-2 capsules (100-200 mg total) by mouth 2 (two) times daily as needed.  Dispense:  40 capsule; Refill: 0 - AMB Referral to  [facility]  2. Mild intermittent asthma with exacerbation   - albuterol (VENTOLIN HFA) 108 (90 Base) MCG/ACT inhaler; Inhale 2 puffs into the lungs every 4 (four) hours as needed for wheezing or shortness of breath.  Dispense: 18 g; Refill: 0 - fluticasone furoate-vilanterol (BREO ELLIPTA) 100-25 MCG/INH AEPB; Inhale 1 puff into the lungs daily.  Dispense: 60 each; Refill: 0 - AMB Referral to [facility]  3. Pure hypercholesterolemia  - AMB Referral to [facility]  I discussed the assessment and treatment plan with the patient. The patient was provided an opportunity to ask questions and all were answered. The patient agreed with the plan and demonstrated an understanding of the instructions.  The patient was advised to call back or seek an in-person evaluation if the symptoms worsen or if the condition fails to improve as anticipated.  I provided 15  minutes of non-face-to-face time during this encounter.

## 2021-03-28 NOTE — Chronic Care Management (AMB) (Signed)
  Chronic Care Management   Note  6/55/3748 Name: Molly [name] MRN: [id number] DOB: [birth year]  Molly [name] is a 70 y.o. year old female who is a primary care patient of Steele Sizer, MD. I reached out to [name] by phone today in response to a referral sent by [name] PCP, Steele Sizer, MD     [name] was given information about Chronic Care Management services today including:  1. CCM service includes personalized support from designated clinical staff supervised by her physician, including individualized plan of care and coordination with other care providers 2. 24/7 contact phone numbers for assistance for urgent and routine care needs. 3. Service will only be billed when office clinical staff spend 20 minutes or more in a month to coordinate care. 4. Only one practitioner may furnish and bill the service in a calendar month. 5. The patient may stop CCM services at any time (effective at the end of the month) by phone call to the office staff. 6. The patient will be responsible for cost sharing (co-pay) of up to 20% of the service fee (after annual deductible is met).  Patient agreed to services and verbal consent obtained.   Follow up plan: Telephone appointment with care management team member scheduled for: 04/10/2021  [facility]

## 2021-04-01 ENCOUNTER — Telehealth: Payer: Self-pay

## 2021-04-01 NOTE — Progress Notes (Signed)
    Chronic Care Management Pharmacy Assistant   Name: Molly [name]  MRN: [id number] DOB: 09/01/1951  Reason for Encounter: Medication Review/Initial question for initial visit with clinical pharmacist on 04/03/2021.   Recent office visits:  None ID  Recent consult visits:  None ID  Hospital visits:  None in previous 6 months  Medications: Outpatient Encounter Medications as of 04/01/2021  Medication Sig Note  . acetaminophen (TYLENOL) 500 MG tablet Take 2 tablets (1,000 mg total) by mouth every 6 (six) hours as needed for mild pain or moderate pain.   Marland Kitchen albuterol (VENTOLIN HFA) 108 (90 Base) MCG/ACT inhaler Inhale 2 puffs into the lungs every 4 (four) hours as needed for wheezing or shortness of breath.   . benzonatate (TESSALON) 100 MG capsule Take 1-2 capsules (100-200 mg total) by mouth 2 (two) times daily as needed.   . diclofenac Sodium (VOLTAREN) 1 % GEL Apply 4 grams to the skin four times daily.   Marland Kitchen escitalopram (LEXAPRO) 10 MG tablet Take 1 tablet by mouth daily.   . Evolocumab (REPATHA) 140 MG/ML SOSY Inject 420 mg into the skin every 30 (thirty) days. (Patient not taking: No sig reported) 01/29/2021: Has not started yet  . ezetimibe (ZETIA) 10 MG tablet Take 1 tablet (10 mg total) by mouth daily.   . fluticasone furoate-vilanterol (BREO ELLIPTA) 100-25 MCG/INH AEPB Inhale 1 puff into the lungs daily.   . furosemide (LASIX) 40 MG tablet Take 1 tablet by mouth daily. (Patient taking differently: as needed.)   . ketoconazole (NIZORAL) 2 % cream Apply 1 application topically daily as needed for irritation. On abdominal fold prn   . levocetirizine (XYZAL) 5 MG tablet Take 1 tablet by mouth every evening.   . olmesartan-hydrochlorothiazide (BENICAR HCT) 20-12.5 MG tablet Take 1 tablet by mouth daily.   . pantoprazole (PROTONIX) 20 MG tablet Take 1 tablet by mouth daily. (Patient taking differently: Take 20 mg by mouth daily as needed.)   . potassium chloride SA (KLOR-CON) 20 MEQ  tablet Take 1 tablet by mouth daily.   . pregabalin (LYRICA) 100 MG capsule Take 1 capsule (100 mg total) by mouth 2 (two) times daily.    No facility-administered encounter medications on file as of 04/01/2021.    Star Rating Drugs: olmesartan-HCTZ  20-12.5 mg last filled 03/19/2021 fo 30 day supply at [facility].   Left Voice message to do initial question prior to patient appointment on 04/03/2021  for CCM at 9:00 am  with Junius Argyle the Clinical pharmacist.   Left message on 04/01/2021 and 04/02/2021.  [facility] Pharmacist Assistant [phone]

## 2021-04-02 ENCOUNTER — Telehealth: Payer: Medicare Other | Admitting: Family Medicine

## 2021-04-03 ENCOUNTER — Other Ambulatory Visit: Payer: Self-pay

## 2021-04-03 ENCOUNTER — Ambulatory Visit (INDEPENDENT_AMBULATORY_CARE_PROVIDER_SITE_OTHER): Payer: Medicare Other

## 2021-04-03 DIAGNOSIS — E7800 Pure hypercholesterolemia, unspecified: Secondary | ICD-10-CM | POA: Diagnosis not present

## 2021-04-03 DIAGNOSIS — J4521 Mild intermittent asthma with (acute) exacerbation: Secondary | ICD-10-CM | POA: Diagnosis not present

## 2021-04-03 DIAGNOSIS — Z8673 Personal history of transient ischemic attack (TIA), and cerebral infarction without residual deficits: Secondary | ICD-10-CM

## 2021-04-03 DIAGNOSIS — F32A Depression, unspecified: Secondary | ICD-10-CM

## 2021-04-03 DIAGNOSIS — I10 Essential (primary) hypertension: Secondary | ICD-10-CM

## 2021-04-03 DIAGNOSIS — Z789 Other specified health status: Secondary | ICD-10-CM

## 2021-04-03 MED ORDER — REPATHA 140 MG/ML ~~LOC~~ SOSY: 140.0000 mg | PREFILLED_SYRINGE | SUBCUTANEOUS | 5 refills | Status: DC

## 2021-04-03 NOTE — Progress Notes (Signed)
Chronic Care Management Pharmacy Note  0/73/7106 Name:  Molly [name] MRN:  [id number] DOB:  [birth year]  Subjective: Molly [name] is an 70 y.o. year old female who is a primary patient of Steele Sizer, MD.  The CCM team was consulted for assistance with disease management and care coordination needs.    Engaged with patient by telephone for initial visit in response to provider referral for pharmacy case management and/or care coordination services.   Consent to Services:  The patient was given the following information about Chronic Care Management services today, agreed to services, and gave verbal consent: 1. CCM service includes personalized support from designated clinical staff supervised by the primary care provider, including individualized plan of care and coordination with other care providers 2. 24/7 contact phone numbers for assistance for urgent and routine care needs. 3. Service will only be billed when office clinical staff spend 20 minutes or more in a month to coordinate care. 4. Only one practitioner may furnish and bill the service in a calendar month. 5.The patient may stop CCM services at any time (effective at the end of the month) by phone call to the office staff. 6. The patient will be responsible for cost sharing (co-pay) of up to 20% of the service fee (after annual deductible is met). Patient agreed to services and consent obtained.  Patient Care Team: Steele Sizer, MD as PCP - General (Family Medicine) Kate Sable, MD as PCP - Cardiology (Cardiology) Rubie Maid, MD as Consulting Physician (Obstetrics and Gynecology) Clent Jacks, RN as Registered Nurse Noreene Filbert, MD as Referring Physician (Radiation Oncology) Sindy Guadeloupe, MD as Consulting Physician (Oncology) Mellody Drown, MD as Referring Physician (Obstetrics) Germaine Pomfret, [facility] (Pharmacist) Neldon Labella, RN as Case Manager Sunday Corn, Autumn Hubbard Hartshorn  (Neurology)  Recent office visits: 03/28/21: Video visit with Dr. Ancil Boozer for viral URI. Flovent stopped. Patient started on Breo 1 puff daily and bezonatate.  03/20/21: Patient presented to Dr. Ky Barban for myalgia. Clopidogrel stopped. CRP, Sed rate elevated.   Recent consult visits: 03/27/21: Patient presented to Dr. Juanetta Snow (Oncology) for endometrial adenocarcinoma follow-up.  03/20/21: Patient presented to Dr. Melrose Nakayama (Neurology) for follow-up. Patient instructed to resume clopidogrel.  12/17/20: Patient presented to Dr. Garen Lah (Cardiology) for follow-up. Clopdigrel stopped due to myalgias.   Hospital visits: None in previous 6 months  Objective:  Lab Results  Component Value Date   CREATININE 0.79 01/29/2021   BUN 14 01/29/2021   GFRNONAA >60 01/29/2021   GFRAA 81 11/07/2020   NA 139 01/29/2021   K 3.8 01/29/2021   CALCIUM 9.6 01/29/2021   CO2 31 01/29/2021   GLUCOSE 109 (H) 01/29/2021    Lab Results  Component Value Date/Time   HGBA1C 5.9 (H) 10/15/2020 04:13 PM   HGBA1C 5.7 (H) 09/08/2019 11:37 AM    Last diabetic Eye exam: No results found for: HMDIABEYEEXA  Last diabetic Foot exam: No results found for: HMDIABFOOTEX   Lab Results  Component Value Date   CHOL 218 (H) 10/15/2020   HDL 63 10/15/2020   LDLCALC 130 (H) 10/15/2020   TRIG 134 10/15/2020   CHOLHDL 3.5 10/15/2020    Hepatic Function Latest Ref Rng & Units 01/29/2021 11/07/2020 07/13/2020  Total Protein 6.5 - 8.1 g/dL 7.4 6.5 6.8  Albumin 3.5 - 5.0 g/dL 4.1 - 3.7  AST 15 - 41 U/L _0 ALT 0 - 44 U/L _1 Alk Phosphatase 38 - 126 U/L 77 -  72  Total Bilirubin 0.3 - 1.2 mg/dL 0.7 0.5 0.5    Lab Results  Component Value Date/Time   TSH 1.47 01/23/2017 08:29 AM    CBC Latest Ref Rng & Units 01/29/2021 07/13/2020 07/06/2020  WBC 4.0 - 10.5 K/uL 5.3 4.7 5.0  Hemoglobin 12.0 - 15.0 g/dL 12.0 11.2(L) 10.5(L)  Hematocrit 36.0 - 46.0 % 35.7(L) 33.7(L) 31.0(L)  Platelets 150 - 400 K/uL 275 265 226     Lab Results  Component Value Date/Time   VD25OH 38 01/23/2017 08:29 AM    Clinical ASCVD: Yes  The ASCVD Risk score Mikey Bussing DC Jr., et al., 2013) failed to calculate for the following reasons:   The patient has a prior MI or stroke diagnosis    Depression screen [facility] 2/9 03/28/2021 03/20/2021 01/02/2021  Decreased Interest 0 0 0  Down, Depressed, Hopeless 0 0 0  PHQ - 2 Score 0 0 0  Altered sleeping - - 0  Tired, decreased energy - - 0  Change in appetite - - 0  Feeling bad or failure about yourself  - - 0  Trouble concentrating - - 0  Moving slowly or fidgety/restless - - 0  Suicidal thoughts - - 0  PHQ-9 Score - - 0  Some recent data might be hidden     Social History   Tobacco Use  Smoking Status Never Smoker  Smokeless Tobacco Never Used  Tobacco Comment   smoking cessation materials not required   BP Readings from Last 3 Encounters:  03/20/21 114/66  01/29/21 136/75  12/18/20 116/69   Pulse Readings from Last 3 Encounters:  03/20/21 90  01/29/21 63  12/18/20 76   Wt Readings from Last 3 Encounters:  03/20/21 233 lb 9.6 oz (106 kg)  01/29/21 231 lb 11.2 oz (105.1 kg)  01/02/21 236 lb (107 kg)   BMI Readings from Last 3 Encounters:  03/20/21 47.18 kg/m  01/29/21 46.80 kg/m  01/02/21 47.67 kg/m    Assessment/Interventions: Review of patient past medical history, allergies, medications, health status, including review of consultants reports, laboratory and other test data, was performed as part of comprehensive evaluation and provision of chronic care management services.   SDOH:  (Social Determinants of Health) assessments and interventions performed: Yes SDOH Interventions   Flowsheet Row Most Recent Value  SDOH Interventions   Financial Strain Interventions Intervention Not Indicated     SDOH Screenings   Alcohol Screen: Low Risk   . Last Alcohol Screening Score (AUDIT): 0  Depression (PHQ2-9): Low Risk   . PHQ-2 Score: 0  Financial Resource  Strain: Low Risk   . Difficulty of Paying Living Expenses: Not hard at all  Food Insecurity: No Food Insecurity  . Worried About Charity fundraiser in the Last Year: Never true  . Ran Out of Food in the Last Year: Never true  Housing: Low Risk   . Last Housing Risk Score: 0  Physical Activity: Inactive  . Days of Exercise per Week: 0 days  . Minutes of Exercise per Session: 0 min  Social Connections: Moderately Integrated  . Frequency of Communication with Friends and Family: More than three times a week  . Frequency of Social Gatherings with Friends and Family: Three times a week  . Attends Religious Services: More than 4 times per year  . Active Member of Clubs or Organizations: Yes  . Attends Archivist Meetings: More than 4 times per year  . Marital Status: Divorced  Stress: No Stress  Concern Present  . Feeling of Stress : Not at all  Tobacco Use: Low Risk   . Smoking Tobacco Use: Never Smoker  . Smokeless Tobacco Use: Never Used  Transportation Needs: No Transportation Needs  . Lack of Transportation (Medical): No  . Lack of Transportation (Non-Medical): No    CCM Care Plan  Allergies  Allergen Reactions  . Ferumoxytol Anaphylaxis  . Nsaids Hives, Rash and Nausea And Vomiting  . Liraglutide Other (See Comments) and Nausea And Vomiting    pancreatitis  . Statins Other (See Comments) and Nausea And Vomiting    Joint pains  . Oxycodone Nausea And Vomiting  . Crestor [Rosuvastatin Calcium] Rash    Medications Reviewed Today    Reviewed by Germaine Pomfret, [facility] (Pharmacist) on 04/03/21 at [phone]  Med List Status: <None>  Medication Order Taking? Sig Documenting Provider Last Dose Status Informant  acetaminophen (TYLENOL) 500 MG tablet [id number] Yes Take 2 tablets (1,000 mg total) by mouth every 6 (six) hours as needed for mild pain or moderate pain. Rubie Maid, MD Taking Active Self  albuterol (VENTOLIN HFA) 108 (90 Base) MCG/ACT inhaler [id number] Yes  Inhale 2 puffs into the lungs every 4 (four) hours as needed for wheezing or shortness of breath. Steele Sizer, MD Taking Active   benzonatate (TESSALON) 100 MG capsule [id number] Yes Take 1-2 capsules (100-200 mg total) by mouth 2 (two) times daily as needed. Steele Sizer, MD Taking Active   clopidogrel (PLAVIX) 75 MG tablet [id number] Yes Take 75 mg by mouth daily. [provider] Taking Active   diclofenac Sodium (VOLTAREN) 1 % GEL [id number] Yes Apply 4 grams to the skin four times daily. Steele Sizer, MD Taking Active   escitalopram (LEXAPRO) 10 MG tablet [id number] Yes Take 1 tablet by mouth daily. Steele Sizer, MD Taking Active   Evolocumab (REPATHA) 140 MG/ML SOSY [id number] No Inject 420 mg into the skin every 30 (thirty) days.  Patient not taking: No sig reported   Steele Sizer, MD Not Taking Active            Med Note Chesley Noon [facility]   Tue Jan 29, 2021 10:19 AM) Has not started yet  fexofenadine (ALLEGRA) 180 MG tablet [id number] Yes Take 180 mg by mouth daily as needed for allergies or rhinitis. [provider] Taking Active   fluticasone furoate-vilanterol (BREO ELLIPTA) 100-25 MCG/INH AEPB [id number] Yes Inhale 1 puff into the lungs daily. Steele Sizer, MD Taking Active   furosemide (LASIX) 40 MG tablet [id number]  Take 1 tablet by mouth daily.  Patient taking differently: as needed.   Steele Sizer, MD  Active            Med Note Burnadette Peter, Chrystie Nose   Fri Nov 09, 2020  1:56 PM)    ketoconazole (NIZORAL) 2 % cream [id number] Yes Apply 1 application topically daily as needed for irritation. On abdominal fold prn Steele Sizer, MD Taking Active Self  levocetirizine (XYZAL) 5 MG tablet [id number] Yes Take 1 tablet by mouth every evening. Steele Sizer, MD Taking Active   olmesartan-hydrochlorothiazide [facility] HCT) 20-12.5 MG tablet [id number] Yes Take 1 tablet by mouth daily. Steele Sizer, MD Taking Active   pantoprazole (PROTONIX) 20 MG tablet  [id number] Yes Take 1 tablet by mouth daily.  Patient taking differently: Take 20 mg by mouth daily as needed.   Steele Sizer, MD Taking Active   potassium chloride SA (KLOR-CON) 20 MEQ tablet [id number] Yes Take 1 tablet by mouth daily. Sowles, [place],  MD Taking Active   pregabalin (LYRICA) 100 MG capsule [id number] Yes Take 1 capsule (100 mg total) by mouth 2 (two) times daily. Steele Sizer, MD Taking Active           Patient Active Problem List   Diagnosis Date Noted  . Myalgia 03/20/2021  . Medication management 03/20/2021  . Umbilical hernia without obstruction and without gangrene 12/25/2020  . 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 Iron deficiency anemia 11/15/2018  . Endometrial adenocarcinoma ([place]) 10/29/2018  . Umbilical hernia without obstruction and without gangrene   . Atherosclerosis of right coronary artery 11/10/2017  . Atherosclerosis of abdominal aorta ([place]) 11/10/2017  . Prediabetes 10/02/2017  . Pain in limb 03/31/2017  . Perennial allergic rhinitis with seasonal variation 05/14/2016  . Asthma, mild intermittent, well-controlled 05/14/2016  . Hypertension goal BP (blood pressure) < 140/90 12/10/2015  . Cardiomyopathy due to hypertension ([place]) 12/10/2015  . History of CVA (cerebrovascular accident) 12/10/2015  . Hyperlipidemia LDL goal <70 12/10/2015  . Statin intolerance 12/10/2015    Immunization History  Administered Date(s) Administered  . Fluad Quad(high Dose 65+) 10/05/2019, 10/15/2020  . Influenza, High Dose Seasonal PF 11/11/2016, 10/02/2017, 09/07/2018  . Pneumococcal Conjugate-13 04/06/2018  . Pneumococcal Polysaccharide-23 11/11/2016  . Tdap 11/11/2016    Conditions to be addressed/monitored:  Hypertension, Hyperlipidemia, Coronary Artery Disease, Asthma, Depression and Allergic Rhinitis  Care Plan : General Pharmacy (Adult)  Updates made by Germaine Pomfret, RPH since  04/16/2021 12:00 AM    Problem: Hypertension, Hyperlipidemia, Coronary Artery Disease, Asthma, Depression and Allergic Rhinitis   Priority: High    Long-Range Goal: Patient-Specific Goal   Start Date: 04/03/2021  Expected End Date: 10/03/2021  This Visit's Progress: On track  Priority: High  Note:   Current Barriers:  . Unable to independently monitor therapeutic efficacy . Unable to achieve control of Cholesterol   Pharmacist Clinical Goal(s):  Marland Kitchen Patient will achieve control of blood pressure as evidenced by Home BP less than 140/90 . maintain control of cholesterol as evidenced by LDL less than 70  through collaboration with PharmD and provider.   Interventions: . 1:1 collaboration with Steele Sizer, MD regarding development and update of comprehensive plan of care as evidenced by provider attestation and co-signature . Inter-disciplinary care team collaboration (see longitudinal plan of care) . Comprehensive medication review performed; medication list updated in electronic medical record  Hypertension (BP goal <140/90) -Controlled -Current treatment: . Furosemide 40 mg daily as needed  . Olmesartan-HCTZ 20-12.5 mg daily -Medications previously tried: NA  -Current home readings: NA -Denies hypotensive/hypertensive symptoms -Educated on Daily salt intake goal < 2300 mg; Exercise goal of 150 minutes per week; Importance of home blood pressure monitoring; -Counseled to monitor BP at home weekly, document, and provide log at future appointments -Recommended to continue current medication  Hyperlipidemia: (LDL goal < 70) -History of TIA  -Uncontrolled -Current treatment: . Repatha 420 mg every 30 days (not received)  -Current antiplatelet treatment: . Clopidogrel 75 mg daily (evening)  -Medications previously tried: Statins, Aspirin -Patient has some myalgias, does not last all day. Managed with tylenol.  -Educated on Importance of limiting foods high in  cholesterol; Strategies to manage statin-induced myalgias; -Recommended CoQ10 100 mg daily to see if that helps with myalgias   -Coq100   Asthma (Goal: control symptoms and prevent exacerbations) -Not ideally controlled -Current treatment  . Ventolin HFA 2 puffs every 4 hours as needed . Breo 1  puff daily  -Current allergic rhinitis treatment  . Levocetirizine 5 mg nightly  -Medications previously tried: NA  -Previous URI symptoms have improved significantly. Still sneezing, coughing, but less severe  -Allergies normally worse in the fall.  -MMRC/CAT score: NA -Pulmonary function testing: NA -Exacerbations requiring treatment in last 6 months: Yes  -Patient denies consistent use of maintenance inhaler; only takes Breo PRN.  -Frequency of rescue inhaler use: 3 times daily  -Counseled on Proper inhaler technique; Benefits of consistent maintenance inhaler use -Recommended to continue current medication  Depression/Anxiety (Goal: Maintain stable mood) -Controlled -Current treatment: . Escitalopram 10 mg daily (AM)  -Medications previously tried/failed: NA -PHQ9: 0 -GAD7: 0 -Connected with NA for mental health support -Educated on Benefits of medication for symptom control -Recommended to continue current medication  GERD (Goal: Prevent heartburn / reflux) -Controlled -Current treatment  . Pantoprazole 20 mg daily (bedtime)  -Medications previously tried: NA  -Avoids pizza at night  -Counseled on trigger avoidance, not laying down after meals -Recommended to continue current medication  Chronic Pain (Goal: Minimize pain and maintain quality of life) -Controlled -Current treatment  . Acetaminophen 500 mg 2 tablets  . Voltaren 1 % gel  . Pregabalin 100 mg twice daily  -Medications previously tried: NA -Counseled to limit acetaminophen use to < 3000 mg daily.   -Recommended to continue current medication   Patient Goals/Self-Care Activities . Patient will:  - focus  on medication adherence by utilizing [facility] services check blood pressure weekly, document, and provide at future appointments  Follow Up Plan: Telephone follow up appointment with care management team member scheduled for:  07/03/21 at 2:00 PM      Medication Assistance: None required.  Patient affirms current coverage meets needs.  Patient's preferred pharmacy is:  [facility] - [place], [place] - [address] [place] [address] Phone: [phone] Fax: [phone]  [place], [place] - [place] [address] Phone: [phone] Fax: [phone]  [id number] [address], [address] [place] [address] Phone: [phone] Fax: [phone]  Uses pill box? Yes Pt endorses 80% compliance  We discussed: Verbal consent obtained for [facility] enhanced pharmacy services (medication synchronization, adherence packaging, delivery coordination). A medication sync plan was created to allow patient to get all medications delivered once every 30 to 90 days per patient preference. Patient understands they have freedom to choose pharmacy and clinical pharmacist will coordinate care between all prescribers and [facility].  Patient decided to: Utilize [facility] for medication synchronization, packaging and delivery  Care Plan and Follow Up Patient Decision:  Patient agrees to Care Plan and Follow-up.  Plan: Telephone follow up appointment with care management team member scheduled for:  07/03/21 at 2:00 PM  Doristine Section, [facility] [phone]

## 2021-04-08 ENCOUNTER — Other Ambulatory Visit: Payer: Self-pay | Admitting: Cardiology

## 2021-04-09 ENCOUNTER — Telehealth: Payer: Self-pay

## 2021-04-09 NOTE — Progress Notes (Signed)
    Chronic Care Management Pharmacy Assistant   Name: Molly [name]  MRN: [id number] DOB: Feb 09, 1951  Reason for Encounter: Medication Review/Onboarding form for [facility].   Recent office visits:  No recent Office visit  Recent consult visits:  No recent [facility] visits:  None in previous 6 months  Medications: Outpatient Encounter Medications as of 04/09/2021  Medication Sig Note  . acetaminophen (TYLENOL) 500 MG tablet Take 2 tablets (1,000 mg total) by mouth every 6 (six) hours as needed for mild pain or moderate pain.   Marland Kitchen albuterol (VENTOLIN HFA) 108 (90 Base) MCG/ACT inhaler Inhale 2 puffs into the lungs every 4 (four) hours as needed for wheezing or shortness of breath.   . benzonatate (TESSALON) 100 MG capsule Take 1-2 capsules (100-200 mg total) by mouth 2 (two) times daily as needed.   . clopidogrel (PLAVIX) 75 MG tablet Take 75 mg by mouth daily. 04/03/2021: Prescribed by Dr. Garen Lah  . diclofenac Sodium (VOLTAREN) 1 % GEL Apply 4 grams to the skin four times daily.   Marland Kitchen escitalopram (LEXAPRO) 10 MG tablet Take 1 tablet by mouth daily.   . Evolocumab (REPATHA) 140 MG/ML SOSY Inject 140 mg into the skin every 14 (fourteen) days.   . fexofenadine (ALLEGRA) 180 MG tablet Take 180 mg by mouth daily as needed for allergies or rhinitis.   . fluticasone furoate-vilanterol (BREO ELLIPTA) 100-25 MCG/INH AEPB Inhale 1 puff into the lungs daily.   . furosemide (LASIX) 40 MG tablet Take 1 tablet by mouth daily. (Patient taking differently: as needed.)   . ketoconazole (NIZORAL) 2 % cream Apply 1 application topically daily as needed for irritation. On abdominal fold prn   . levocetirizine (XYZAL) 5 MG tablet Take 1 tablet by mouth every evening.   . olmesartan-hydrochlorothiazide (BENICAR HCT) 20-12.5 MG tablet Take 1 tablet by mouth daily.   . pantoprazole (PROTONIX) 20 MG tablet Take 1 tablet by mouth daily. (Patient taking differently: Take 20 mg by mouth  daily as needed.)   . potassium chloride SA (KLOR-CON) 20 MEQ tablet Take 1 tablet by mouth daily.   . pregabalin (LYRICA) 100 MG capsule Take 1 capsule (100 mg total) by mouth 2 (two) times daily.    No facility-administered encounter medications on file as of 04/09/2021.   Star Rating Drugs: olmesartan-HCTZ  20-12.5 mg last filled 04/08/2021 fo 30 day supply at  [place] out to [facility] on 04/09/2021 to [facility] a [facility] to [facility]. Per [employer] recently transfer to [facility].  Reached out to [facility] on 04/09/2021 to request a profile [facility]. Per [facility],patient needs to call them to request a transfer.  Spoke with patient to confirm if she still wanted to transfer to [facility].Patient confirms she wants to transfer, and will call [facility] to request the transfer on 04/09/2021.I informed patient of [facility] address,phone and fax number.Patient states will ask [facility] to deliver her medications for April.  Reached out to Cardiology to request a refill for Clopidogrel 75 mg to go to [facility] on 04/09/2021 (on hold for 9 minutes)  Completed Onboarding form to come on board with [facility] and sent to clinical pharmacist for review.   [employer] Pharmacist Assistant [phone]

## 2021-04-09 NOTE — Telephone Encounter (Signed)
*  STAT* If patient is at the pharmacy, call can be transferred to refill team.   1. Which medications need to be refilled? (please list name of each medication and dose if known) Clopidogrel (PLAVIX) 75 MG daily   2. Which pharmacy/location (including street and city if local pharmacy) is medication to be sent to? [facility]   3. Do they need a 30 day or 90 day supply? 90 day

## 2021-04-10 ENCOUNTER — Other Ambulatory Visit: Payer: Self-pay | Admitting: Family Medicine

## 2021-04-10 ENCOUNTER — Telehealth: Payer: Medicare Other

## 2021-04-10 ENCOUNTER — Telehealth: Payer: Self-pay

## 2021-04-10 DIAGNOSIS — J4521 Mild intermittent asthma with (acute) exacerbation: Secondary | ICD-10-CM

## 2021-04-10 NOTE — Telephone Encounter (Signed)
Requested Prescriptions  Pending Prescriptions Disp Refills  . albuterol (VENTOLIN HFA) 108 (90 Base) MCG/ACT inhaler [Pharmacy Med Name: ALBUTEROL HFA INH (200 PUFFS)8.5GM] 8.5 g 1    Sig: INHALE 2 PUFFS INTO THE LUNGS EVERY 4 HOURS AS NEEDED FOR WHEEZING OR SHORTNESS OF BREATH     Pulmonology:  Beta Agonists Failed - 04/10/2021  3:36 AM      Failed - One inhaler should last at least one month. If the patient is requesting refills earlier, contact the patient to check for uncontrolled symptoms.      Passed - Valid encounter within last 12 months    Recent Outpatient Visits          1 week ago Viral upper respiratory tract infection   [facility] Steele Sizer, MD   3 weeks ago [place], DO   3 months ago [facility] Steele Sizer, MD   5 months ago Hospital discharge follow-up   [facility] Steele Sizer, MD   5 months ago Atherosclerosis of abdominal aorta [place])   [facility] Steele Sizer, MD      Future Appointments            In 1 week Steele Sizer, MD [facility] [place]   In 3 weeks  [place]

## 2021-04-10 NOTE — Telephone Encounter (Signed)
  Chronic Care Management   Outreach Note  05/04/2256 Name: Molly [name] MRN: [id number] DOB: 1951/12/12  Primary Care Provider: Steele Sizer, MD Reason for referral : Chronic Care Management   An unsuccessful telephone outreach was attempted today. Molly [name] was referred to the case management team for assistance with care management and care coordination.      Follow Up Plan:  A HIPAA compliant voice message was left today. Will inform the Care Guide team of need to assist with rescheduling.    C[facility] [phone]

## 2021-04-15 ENCOUNTER — Ambulatory Visit: Payer: Medicare Other | Admitting: Family Medicine

## 2021-04-16 NOTE — Patient Instructions (Signed)
Visit Information It was great speaking with you today!  Please let me know if you have any questions about our visit.  Goals Addressed            This Visit's Progress   . Track and Manage My Symptoms-Asthma       Timeframe:  Long-Range Goal Priority:  High Start Date: 04/03/2021                            Expected End Date:  10/03/2021                     Follow Up Date 05/27/2021   - avoid symptom triggers outdoors - eliminate symptom triggers at home - keep rescue medicines on hand  - take long-acting maintenance inhaler every daily    Why is this important?    Keeping track of asthma symptoms can tell you a lot about your asthma control.   Based on symptoms and peak flow results you can see how well you are doing.   Your asthma action plan has a green, yellow and red zone. Green means all is good; it is your goal. Yellow means your symptoms are a little worse. You will need to adjust your medications. Being in the red zone means that your    asthma is out of control. You will need to use your rescue medicines. You may need emergency care.     Notes:        Patient Care Plan: General Pharmacy (Adult)    Problem Identified: Hypertension, Hyperlipidemia, Coronary Artery Disease, Asthma, Depression and Allergic Rhinitis   Priority: High    Long-Range Goal: Patient-Specific Goal   Start Date: 04/03/2021  Expected End Date: 10/03/2021  This Visit's Progress: On track  Priority: High  Note:   Current Barriers:  . Unable to independently monitor therapeutic efficacy . Unable to achieve control of Cholesterol   Pharmacist Clinical Goal(s):  Marland Kitchen Patient will achieve control of blood pressure as evidenced by Home BP less than 140/90 . maintain control of cholesterol as evidenced by LDL less than 70  through collaboration with PharmD and provider.   Interventions: . 1:1 collaboration with Steele Sizer, MD regarding development and update of comprehensive plan of care as  evidenced by provider attestation and co-signature . Inter-disciplinary care team collaboration (see longitudinal plan of care) . Comprehensive medication review performed; medication list updated in electronic medical record  Hypertension (BP goal <140/90) -Controlled -Current treatment: . Furosemide 40 mg daily as needed  . Olmesartan-HCTZ 20-12.5 mg daily -Medications previously tried: NA  -Current home readings: NA -Denies hypotensive/hypertensive symptoms -Educated on Daily salt intake goal < 2300 mg; Exercise goal of 150 minutes per week; Importance of home blood pressure monitoring; -Counseled to monitor BP at home weekly, document, and provide log at future appointments -Recommended to continue current medication  Hyperlipidemia: (LDL goal < 70) -History of TIA  -Uncontrolled -Current treatment: . Repatha 420 mg every 30 days (not received)  -Current antiplatelet treatment: . Clopidogrel 75 mg daily (evening)  -Medications previously tried: Statins, Aspirin -Patient has some myalgias, does not last all day. Managed with tylenol.  -Educated on Importance of limiting foods high in cholesterol; Strategies to manage statin-induced myalgias; -Recommended CoQ10 100 mg daily to see if that helps with myalgias   -Coq100   Asthma (Goal: control symptoms and prevent exacerbations) -Not ideally controlled -Current treatment  . Ventolin HFA 2  puffs every 4 hours as needed . Breo 1 puff daily  -Current allergic rhinitis treatment  . Levocetirizine 5 mg nightly  -Medications previously tried: NA  -Previous URI symptoms have improved significantly. Still sneezing, coughing, but less severe  -Allergies normally worse in the fall.  -MMRC/CAT score: NA -Pulmonary function testing: NA -Exacerbations requiring treatment in last 6 months: Yes  -Patient denies consistent use of maintenance inhaler; only takes Breo PRN.  -Frequency of rescue inhaler use: 3 times daily  -Counseled on  Proper inhaler technique; Benefits of consistent maintenance inhaler use -Recommended to continue current medication  Depression/Anxiety (Goal: Maintain stable mood) -Controlled -Current treatment: . Escitalopram 10 mg daily (AM)  -Medications previously tried/failed: NA -PHQ9: 0 -GAD7: 0 -Connected with NA for mental health support -Educated on Benefits of medication for symptom control -Recommended to continue current medication  GERD (Goal: Prevent heartburn / reflux) -Controlled -Current treatment  . Pantoprazole 20 mg daily (bedtime)  -Medications previously tried: NA  -Avoids pizza at night  -Counseled on trigger avoidance, not laying down after meals -Recommended to continue current medication  Chronic Pain (Goal: Minimize pain and maintain quality of life) -Controlled -Current treatment  . Acetaminophen 500 mg 2 tablets  . Voltaren 1 % gel  . Pregabalin 100 mg twice daily  -Medications previously tried: NA -Counseled to limit acetaminophen use to < 3000 mg daily.   -Recommended to continue current medication   Patient Goals/Self-Care Activities . Patient will:  - focus on medication adherence by utilizing [facility] enhanced pharmacy services check blood pressure weekly, document, and provide at future appointments  Follow Up Plan: Telephone follow up appointment with care management team member scheduled for:  07/03/21 at 2:00 PM       Ms. Randhawa was given information about Chronic Care Management services today including:  1. CCM service includes personalized support from designated clinical staff supervised by her physician, including individualized plan of care and coordination with other care providers 2. 24/7 contact phone numbers for assistance for urgent and routine care needs. 3. Standard insurance, coinsurance, copays and deductibles apply for chronic care management only during months in which we provide at least 20 minutes of these services. Most  insurances cover these services at 100%, however patients may be responsible for any copay, coinsurance and/or deductible if applicable. This service may help you avoid the need for more expensive face-to-face services. 4. Only one practitioner may furnish and bill the service in a calendar month. 5. The patient may stop CCM services at any time (effective at the end of the month) by phone call to the office staff.  Patient agreed to services and verbal consent obtained.   Verbal consent obtained for [facility] enhanced pharmacy services (medication synchronization, adherence packaging, delivery coordination). A medication sync plan was created to allow patient to get all medications delivered once every 30 to 90 days per patient preference. Patient understands they have freedom to choose pharmacy and clinical pharmacist will coordinate care between all prescribers and [facility].  The patient verbalized understanding of instructions, educational materials, and care plan provided today and declined offer to receive copy of patient instructions, educational materials, and care plan.   Doristine Section, [facility] [phone]

## 2021-04-22 NOTE — Progress Notes (Signed)
Name: Molly [name]   MRN: [id number]    DOB: 12/08/1951   Date:04/23/2021       Progress Note  Subjective  Chief Complaint  Follow up   HPI   Asthma: mild persistent  cough still daily but not as intense, a few times a day, but no sob or wheezing, occasional wheezing when outdoors without her mask due to high pollen count, taking Xyzal and Breo daily, using rescue inhaler a few times a week   Major Depression:currently in remission doing better emotionally with lexapro, she worries about the  restaurant ( At  [employer] ) but is helpful to be busy. She states there is another new restaurant in the area, but has been coping well for now   Cardiomyopathy without WUJ:WJXBJ extremity edema at little worse because she has been standing more and has been taking lasix daily prn she denies orthopnea or PND   Allodynia: resolved since she stopped wearing turbans all the time  Endometrial cancer: s/p hysterectomy,finished the chemo and radiation therapy with Dr. Baruch Gouty , no longer has a port, she had a CT pelvis 10/2020 and negative for recurrence of cancer. She is up to date with visits with Dr. Janese Banks and Vista Mink. She has neuropathy from chemotherapy, pain no longer daily, she states numbness is constant no longer burning all the time, stabbing pain also resolved with therapy  Morbid obesity: off victoza because of episode of pancreatitis, also has prediabetes,she was on metformin but we stopped medication since diagnosed with endometrial cancer because she had nausea and was losing weight, discussed going back on medication now. She gained from 213 lbs to 236 lbs, today she is down to 234 lbs. She states since endometrial cancer her appetite is back , can taste food again, but trying to be more mindful and portion control    Dyslipidemia: cannot tolerate statin therapy, she developed shoulder and upper arm pain around Feb and stopped Zetia, pain still present and may be unrelated  but Repatha has been approved and is getting shipped to her . She has history of TIA and needs LDL below 70, also history of CAD no history of MI   HTN:bp is at goal, no chest pain, dizziness,or palpitation. Continue medication   Atherosclerosis aorta: on zetia, because she cannot tolerate statin therapy, she denies side effects of medication, she will start [place] and we will recheck labs on her next visit   GERD: she is currently taking PPI prn.   Joint aches: she was seen by Dr. Ky Barban back in Feb, had negative ANA and RF, CRP and sed rate were very high. She states pain was sudden on both shoulders with difficulty abduction and had to use other hand to assist abduction of right shoulder. She states she is doing better but no longer constant. Discussed PMR. She would like to have repeat labs and see Rheumatologist since still having pain, although not as intense. Having difficulty sleeping due to pain. Pain is still constant 2/10, with movement of shoulder it goes up to 5/10   Patient Active Problem List   Diagnosis Date Noted  . Myalgia 03/20/2021  . Medication management 03/20/2021  . Umbilical hernia without obstruction and without gangrene 12/25/2020  . 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 Iron deficiency anemia 11/15/2018  . Endometrial adenocarcinoma ([place]) 10/29/2018  . Umbilical hernia without obstruction and without gangrene   . Atherosclerosis of right coronary artery 11/10/2017  .  Atherosclerosis of abdominal aorta ([place]) 11/10/2017  . Prediabetes 10/02/2017  . Pain in limb 03/31/2017  . Perennial allergic rhinitis with seasonal variation 05/14/2016  . Asthma, mild intermittent, well-controlled 05/14/2016  . Hypertension goal BP (blood pressure) < 140/90 12/10/2015  . Cardiomyopathy due to hypertension ([place]) 12/10/2015  . History of CVA (cerebrovascular accident) 12/10/2015  . Hyperlipidemia LDL goal <70  12/10/2015  . Statin intolerance 12/10/2015    Past Surgical History:  Procedure Laterality Date  . ABDOMINAL HYSTERECTOMY    . CHOLECYSTECTOMY    . COLONOSCOPY WITH PROPOFOL N/A 01/08/2017   Procedure: COLONOSCOPY WITH PROPOFOL;  Surgeon: [name], MD;  Location: [facility] ENDOSCOPY;  Service: Endoscopy;  Laterality: N/A;  . COLONOSCOPY WITH PROPOFOL N/A 05/05/2018   Procedure: COLONOSCOPY WITH PROPOFOL;  Surgeon: [name], MD;  Location: [facility] ENDOSCOPY;  Service: Gastroenterology;  Laterality: N/A;  . HERNIA REPAIR  71/6967   umbilical  . PORTACATH PLACEMENT N/A 11/04/2018   Procedure: INSERTION PORT-A-CATH;  Surgeon: Jules Husbands, MD;  Location: [facility] ORS;  Service: General;  Laterality: N/A;  . SENTINEL NODE BIOPSY N/A 10/06/2018   Procedure: SENTINEL NODE BIOPSY;  Surgeon: Mellody Drown, MD;  Location: [facility] ORS;  Service: Gynecology;  Laterality: N/A;  . UMBILICAL HERNIA REPAIR N/A 11/17/2017   Procedure: HERNIA REPAIR UMBILICAL ADULT;  Surgeon: Jules Husbands, MD;  Location: [facility] ORS;  Service: General;  Laterality: N/A;    Family History  Problem Relation Age of Onset  . Stroke Mother   . Hypertension Mother   . Dementia Mother   . Alzheimer's disease Mother   . Gout Father   . Asthma Father   . Hypertension Father   . Dementia Father   . Healthy Sister   . Stroke Brother   . Alzheimer's disease Brother   . Healthy Daughter   . Hypertension Brother   . Healthy Brother   . Cancer Paternal Grandmother   . Healthy Sister   . Healthy Sister   . Hypertension Sister   . Hypertension Sister   . Stroke Brother   . Alzheimer's disease Brother   . Stroke Brother   . Hypertension Brother   . Stroke Brother   . Hypertension Brother   . Healthy Brother   . Healthy Brother   . Hypertension Daughter   . Breast cancer Neg Hx     Social History   Tobacco Use  . Smoking status: Never Smoker  . Smokeless tobacco: Never Used  . Tobacco comment: smoking cessation  materials not required  Substance Use Topics  . Alcohol use: No    Alcohol/week: 0.0 standard drinks     Current Outpatient Medications:  .  acetaminophen (TYLENOL) 500 MG tablet, Take 2 tablets (1,000 mg total) by mouth every 6 (six) hours as needed for mild pain or moderate pain., Disp: 30 tablet, Rfl: 0 .  albuterol (VENTOLIN HFA) 108 (90 Base) MCG/ACT inhaler, INHALE 2 PUFFS INTO THE LUNGS EVERY 4 HOURS AS NEEDED FOR WHEEZING OR SHORTNESS OF BREATH, Disp: 8.5 g, Rfl: 1 .  clopidogrel (PLAVIX) 75 MG tablet, Take 75 mg by mouth daily., Disp: , Rfl:  .  diclofenac Sodium (VOLTAREN) 1 % GEL, Apply 4 grams to the skin four times daily., Disp: 300 g, Rfl: 0 .  escitalopram (LEXAPRO) 10 MG tablet, Take 1 tablet by mouth daily., Disp: 90 tablet, Rfl: 0 .  Evolocumab (REPATHA) 140 MG/ML SOSY, Inject 140 mg into the skin every 14 (fourteen) days., Disp: 4.2 mL,  Rfl: 5 .  ezetimibe (ZETIA) 10 MG tablet, Take 10 mg by mouth daily., Disp: , Rfl:  .  fexofenadine (ALLEGRA) 180 MG tablet, Take 180 mg by mouth daily as needed for allergies or rhinitis., Disp: , Rfl:  .  fluticasone furoate-vilanterol (BREO ELLIPTA) 100-25 MCG/INH AEPB, Inhale 1 puff into the lungs daily., Disp: 60 each, Rfl: 0 .  furosemide (LASIX) 40 MG tablet, Take 1 tablet by mouth daily. (Patient taking differently: as needed.), Disp: 30 tablet, Rfl: 0 .  ketoconazole (NIZORAL) 2 % cream, Apply 1 application topically daily as needed for irritation. On abdominal fold prn, Disp: 60 g, Rfl: 0 .  levocetirizine (XYZAL) 5 MG tablet, Take 1 tablet by mouth every evening., Disp: 90 tablet, Rfl: 3 .  olmesartan-hydrochlorothiazide (BENICAR HCT) 20-12.5 MG tablet, Take 1 tablet by mouth daily., Disp: 90 tablet, Rfl: 1 .  pantoprazole (PROTONIX) 20 MG tablet, Take 1 tablet by mouth daily. (Patient taking differently: Take 20 mg by mouth daily as needed.), Disp: 90 tablet, Rfl: 3 .  potassium chloride SA (KLOR-CON) 20 MEQ tablet, Take 1 tablet  by mouth daily., Disp: 90 tablet, Rfl: 3 .  pregabalin (LYRICA) 100 MG capsule, Take 1 capsule (100 mg total) by mouth 2 (two) times daily., Disp: 180 capsule, Rfl: 1  Allergies  Allergen Reactions  . Ferumoxytol Anaphylaxis  . Nsaids Hives, Rash and Nausea And Vomiting  . Liraglutide Other (See Comments) and Nausea And Vomiting    pancreatitis  . Statins Other (See Comments) and Nausea And Vomiting    Joint pains  . Oxycodone Nausea And Vomiting  . Crestor [Rosuvastatin Calcium] Rash    I personally reviewed active problem list, medication list, allergies, family history, social history, health maintenance with the patient/caregiver today.   ROS  Constitutional: Negative for fever or weight change.  Respiratory: Negative for cough and shortness of breath.   Cardiovascular: Negative for chest pain or palpitations.  Gastrointestinal: Negative for abdominal pain, no bowel changes.  Musculoskeletal: Negative for gait problem or joint swelling.  Skin: Negative for rash.  Neurological: Negative for dizziness or headache.  No other specific complaints in a complete review of systems (except as listed in HPI above).  Objective  Vitals:   04/23/21 0826  BP: 128/76  Pulse: 86  Resp: 16  Temp: 98.1 F (36.7 C)  TempSrc: Oral  SpO2: 97%  Weight: 234 lb (106.1 kg)  Height: 4' 11"  (1.499 m)    Body mass index is 47.26 kg/m.  Physical Exam  Constitutional: Patient appears well-developed and well-nourished. Obese  No distress.  HEENT: head atraumatic, normocephalic, pupils equal and reactive to light,neck supple Cardiovascular: Normal rate, regular rhythm and normal heart sounds.  No murmur heard. No BLE edema. Pulmonary/Chest: Effort normal and breath sounds normal. No respiratory distress. Abdominal: Soft.  There is no tenderness. Muscular skeletal: pain with abduction of both shoulders Psychiatric: Patient has a normal mood and affect. behavior is normal. Judgment and  thought content normal.  Recent Results (from the past 2160 hour(s))  CA 125     Status: None   Collection Time: 01/29/21  9:43 AM  Result Value Ref Range   Cancer Antigen (CA) 125 13.3 0.0 - 38.1 U/mL    Comment: (NOTE) Roche Diagnostics Electrochemiluminescence Immunoassay (ECLIA) Values obtained with different assay methods or kits cannot be used interchangeably.  Results cannot be interpreted as absolute evidence of the presence or absence of malignant disease. Performed At: [facility] [employer] [address]  [address] [place], [place] [id number] Rush Farmer MD CB:[id number]   Comprehensive metabolic panel     Status: Abnormal   Collection Time: 01/29/21  9:43 AM  Result Value Ref Range   Sodium 139 135 - 145 mmol/L   Potassium 3.8 3.5 - 5.1 mmol/L   Chloride 100 98 - 111 mmol/L   CO2 31 22 - 32 mmol/L   Glucose, Bld 109 (H) 70 - 99 mg/dL    Comment: Glucose reference range applies only to samples taken after fasting for at least 8 hours.   BUN 14 8 - 23 mg/dL   Creatinine, Ser 0.79 0.44 - 1.00 mg/dL   Calcium 9.6 8.9 - 10.3 mg/dL   Total Protein 7.4 6.5 - 8.1 g/dL   Albumin 4.1 3.5 - 5.0 g/dL   AST 23 15 - 41 U/L   ALT 19 0 - 44 U/L   Alkaline Phosphatase 77 38 - 126 U/L   Total Bilirubin 0.7 0.3 - 1.2 mg/dL   GFR, Estimated >60 >60 mL/min    Comment: (NOTE) Calculated using the CKD-EPI Creatinine Equation (2021)    Anion gap 8 5 - 15    Comment: Performed at [facility], [address] [place], [place] [address]  CBC with Differential     Status: Abnormal   Collection Time: 01/29/21  9:43 AM  Result Value Ref Range   WBC 5.3 4.0 - 10.5 K/uL   RBC 3.92 3.87 - 5.11 MIL/uL   Hemoglobin 12.0 12.0 - 15.0 g/dL   HCT 35.7 (L) 36.0 - 46.0 %   MCV 91.1 80.0 - 100.0 fL   MCH 30.6 26.0 - 34.0 pg   MCHC 33.6 30.0 - 36.0 g/dL   RDW 13.5 11.5 - 15.5 %   Platelets 275 150 - 400 K/uL   nRBC 0.0 0.0 - 0.2 %   Neutrophils Relative % 58 %   Neutro Abs 3.0 1.7 - 7.7 K/uL    Lymphocytes Relative 32 %   Lymphs Abs 1.7 0.7 - 4.0 K/uL   Monocytes Relative 9 %   Monocytes Absolute 0.5 0.1 - 1.0 K/uL   Eosinophils Relative 1 %   Eosinophils Absolute 0.1 0.0 - 0.5 K/uL   Basophils Relative 0 %   Basophils Absolute 0.0 0.0 - 0.1 K/uL   Immature Granulocytes 0 %   Abs Immature Granulocytes 0.02 0.00 - 0.07 K/uL    Comment: Performed at [facility], [address] [place], [place] [address]  CK     Status: None   Collection Time: 03/20/21 12:00 AM  Result Value Ref Range   Total CK 143 29 - 143 U/L  Sed Rate (ESR)     Status: Abnormal   Collection Time: 03/20/21 12:00 AM  Result Value Ref Range   Sed Rate 33 (H) 0 - 30 mm/h  C-reactive protein     Status: Abnormal   Collection Time: 03/20/21 12:00 AM  Result Value Ref Range   CRP 40.5 (H) <0.0 mg/L  Cyclic citrul peptide antibody, IgG     Status: None   Collection Time: 03/20/21 12:00 AM  Result Value Ref Range   Cyclic Citrullin Peptide Ab <16 UNITS    Comment: Reference Range Negative:            <20 Weak Positive:       20-39 Moderate Positive:   40-59 Strong Positive:     >59 .   Rheumatoid Factor     Status: None  Collection Time: 03/20/21 12:00 AM  Result Value Ref Range   Rhuematoid fact SerPl-aCnc <14 <14 IU/mL  Antinuclear Antib (ANA)     Status: None   Collection Time: 03/20/21 12:00 AM  Result Value Ref Range   Anti Nuclear Antibody (ANA) NEGATIVE NEGATIVE    Comment: ANA IFA is a first line screen for detecting the presence of up to approximately 150 autoantibodies in various autoimmune diseases. A negative ANA IFA result suggests an ANA-associated autoimmune disease is not present at this time, but is not definitive. If there is high clinical suspicion for Sjogren's syndrome, testing for anti-SS-A/Ro antibody should be considered. Anti-Jo-1 antibody should be considered for clinically suspected inflammatory myopathies. . AC-0: Negative . International Consensus on  ANA Patterns (https://www.hernandez-brewer.com/) . For additional information, please refer to http://education.[employer].com/faq/FAQ177 (This link is being provided for informational/ educational purposes only.) .       PHQ2/9: Depression screen [facility] 2/9 04/23/2021 03/28/2021 03/20/2021 01/02/2021 11/07/2020  Decreased Interest 0 0 0 0 0  Down, Depressed, Hopeless 0 0 0 0 0  PHQ - 2 Score 0 0 0 0 0  Altered sleeping 0 - - 0 0  Tired, decreased energy 0 - - 0 0  Change in appetite 0 - - 0 0  Feeling bad or failure about yourself  0 - - 0 0  Trouble concentrating 0 - - 0 0  Moving slowly or fidgety/restless 0 - - 0 0  Suicidal thoughts 0 - - 0 0  PHQ-9 Score 0 - - 0 0  Some recent data might be hidden    phq 9 is negative   Fall Risk: Fall Risk  04/23/2021 03/28/2021 03/20/2021 01/02/2021 11/07/2020  Falls in the past year? 0 0 0 1 1  Number falls in past yr: 0 0 0 1 1  Injury with Fall? 0 0 0 0 0  Risk for fall due to : - - - History of fall(s) -  Risk for fall due to: Comment - - - - -  Follow up - - - - -     Functional Status Survey: Is the patient deaf or have difficulty hearing?: No Does the patient have difficulty seeing, even when wearing glasses/contacts?: No Does the patient have difficulty concentrating, remembering, or making decisions?: No Does the patient have difficulty walking or climbing stairs?: Yes Does the patient have difficulty dressing or bathing?: No Does the patient have difficulty doing errands alone such as visiting a doctor's office or shopping?: No   Assessment & Plan  1. Atherosclerosis of abdominal aorta ([place])   2. Morbid obesity ([place])  Discussed with the patient the risk posed by an increased BMI. Discussed importance of portion control, calorie counting and at least 150 minutes of physical activity weekly. Avoid sweet beverages and drink more water. Eat at least 6 servings of fruit and vegetables daily   3. Major depression  in remission ([place])  - escitalopram (LEXAPRO) 10 MG tablet; Take 1 tablet (10 mg total) by mouth daily.  Dispense: 90 tablet; Refill: 1  4. Peripheral neuropathy due to chemotherapy (H[facility])  - pregabalin (LYRICA) 100 MG capsule; Take 1 capsule (100 mg total) by mouth 2 (two) times daily.  Dispense: 180 capsule; Refill: 1  5. Pure hypercholesterolemia   6. Cardiomyopathy due to hypertension, without heart failure ([place])   7. Primary hypertension  - olmesartan-hydrochlorothiazide (BENICAR HCT) 20-12.5 MG tablet; Take 1 tablet by mouth daily.  Dispense: 90 tablet; Refill: 1  8. Dyslipidemia  9. Statin intolerance   10. History of TIA (transient ischemic attack)   11. Primary osteoarthritis of both knees   12. History of CVA (cerebrovascular accident) without residual deficits   13. Anemia of chronic disease   14. GERD without esophagitis   15. Mild persistent asthma  - fluticasone furoate-vilanterol (BREO ELLIPTA) 100-25 MCG/INH AEPB; Inhale 1 puff into the lungs daily.  Dispense: 180 each; Refill: 1  16. Elevated C-reactive protein (CRP)  - Ambulatory referral to Rheumatology - Sedimentation rate - C-reactive protein  17. Pain of both shoulder joints  - Ambulatory referral to Rheumatology - Sedimentation rate - C-reactive protein  18. Hyperglycemia  - Hemoglobin A1c  19. Perennial allergic rhinitis with seasonal variation  - levocetirizine (XYZAL) 5 MG tablet; Take 1 tablet (5 mg total) by mouth every evening.  Dispense: 90 tablet; Refill: 1

## 2021-04-23 ENCOUNTER — Ambulatory Visit (INDEPENDENT_AMBULATORY_CARE_PROVIDER_SITE_OTHER): Payer: Medicare Other | Admitting: Family Medicine

## 2021-04-23 ENCOUNTER — Other Ambulatory Visit: Payer: Self-pay

## 2021-04-23 ENCOUNTER — Encounter: Payer: Self-pay | Admitting: Family Medicine

## 2021-04-23 VITALS — BP 128/76 | HR 86 | Temp 98.1°F | Resp 16 | Ht 59.0 in | Wt 234.0 lb

## 2021-04-23 DIAGNOSIS — J302 Other seasonal allergic rhinitis: Secondary | ICD-10-CM

## 2021-04-23 DIAGNOSIS — Z8673 Personal history of transient ischemic attack (TIA), and cerebral infarction without residual deficits: Secondary | ICD-10-CM | POA: Diagnosis not present

## 2021-04-23 DIAGNOSIS — G620 Drug-induced polyneuropathy: Secondary | ICD-10-CM | POA: Diagnosis not present

## 2021-04-23 DIAGNOSIS — E785 Hyperlipidemia, unspecified: Secondary | ICD-10-CM

## 2021-04-23 DIAGNOSIS — M25512 Pain in left shoulder: Secondary | ICD-10-CM

## 2021-04-23 DIAGNOSIS — R7982 Elevated C-reactive protein (CRP): Secondary | ICD-10-CM | POA: Diagnosis not present

## 2021-04-23 DIAGNOSIS — I700 Atherosclerosis of aorta: Secondary | ICD-10-CM | POA: Diagnosis not present

## 2021-04-23 DIAGNOSIS — R739 Hyperglycemia, unspecified: Secondary | ICD-10-CM

## 2021-04-23 DIAGNOSIS — E7800 Pure hypercholesterolemia, unspecified: Secondary | ICD-10-CM

## 2021-04-23 DIAGNOSIS — T451X5A Adverse effect of antineoplastic and immunosuppressive drugs, initial encounter: Secondary | ICD-10-CM

## 2021-04-23 DIAGNOSIS — I119 Hypertensive heart disease without heart failure: Secondary | ICD-10-CM

## 2021-04-23 DIAGNOSIS — I43 Cardiomyopathy in diseases classified elsewhere: Secondary | ICD-10-CM

## 2021-04-23 DIAGNOSIS — J4540 Moderate persistent asthma, uncomplicated: Secondary | ICD-10-CM

## 2021-04-23 DIAGNOSIS — D638 Anemia in other chronic diseases classified elsewhere: Secondary | ICD-10-CM

## 2021-04-23 DIAGNOSIS — Z789 Other specified health status: Secondary | ICD-10-CM

## 2021-04-23 DIAGNOSIS — M25511 Pain in right shoulder: Secondary | ICD-10-CM

## 2021-04-23 DIAGNOSIS — M170 Bilateral primary osteoarthritis of knee: Secondary | ICD-10-CM | POA: Diagnosis not present

## 2021-04-23 DIAGNOSIS — K219 Gastro-esophageal reflux disease without esophagitis: Secondary | ICD-10-CM

## 2021-04-23 DIAGNOSIS — I10 Essential (primary) hypertension: Secondary | ICD-10-CM | POA: Diagnosis not present

## 2021-04-23 DIAGNOSIS — F325 Major depressive disorder, single episode, in full remission: Secondary | ICD-10-CM

## 2021-04-23 DIAGNOSIS — J3089 Other allergic rhinitis: Secondary | ICD-10-CM

## 2021-04-23 MED ORDER — ESCITALOPRAM OXALATE 10 MG PO TABS: 1.0000 | ORAL_TABLET | Freq: Every day | ORAL | 1 refills | Status: DC

## 2021-04-23 MED ORDER — OLMESARTAN MEDOXOMIL-HCTZ 20-12.5 MG PO TABS
1.0000 | ORAL_TABLET | Freq: Every day | ORAL | 1 refills | Status: DC
Start: 2021-04-23 — End: 2021-06-24

## 2021-04-23 MED ORDER — PREGABALIN 100 MG PO CAPS: 100.0000 mg | ORAL_CAPSULE | Freq: Two times a day (BID) | ORAL | 1 refills | Status: DC

## 2021-04-23 MED ORDER — BREO ELLIPTA 100-25 MCG/INH IN AEPB: 1.0000 | INHALATION_SPRAY | Freq: Every day | RESPIRATORY_TRACT | 1 refills | Status: DC

## 2021-04-23 MED ORDER — LEVOCETIRIZINE DIHYDROCHLORIDE 5 MG PO TABS: 5.0000 mg | ORAL_TABLET | Freq: Every evening | ORAL | 1 refills | Status: DC

## 2021-04-24 ENCOUNTER — Inpatient Hospital Stay: Payer: Medicare Other | Attending: Obstetrics and Gynecology | Admitting: Obstetrics and Gynecology

## 2021-04-24 ENCOUNTER — Other Ambulatory Visit: Payer: Self-pay | Admitting: Family Medicine

## 2021-04-24 VITALS — BP 152/75 | HR 75 | Temp 97.8°F | Resp 18 | Wt 236.6 lb

## 2021-04-24 DIAGNOSIS — Z79899 Other long term (current) drug therapy: Secondary | ICD-10-CM | POA: Insufficient documentation

## 2021-04-24 DIAGNOSIS — Z8673 Personal history of transient ischemic attack (TIA), and cerebral infarction without residual deficits: Secondary | ICD-10-CM | POA: Insufficient documentation

## 2021-04-24 DIAGNOSIS — Z7901 Long term (current) use of anticoagulants: Secondary | ICD-10-CM | POA: Insufficient documentation

## 2021-04-24 DIAGNOSIS — Z923 Personal history of irradiation: Secondary | ICD-10-CM | POA: Diagnosis not present

## 2021-04-24 DIAGNOSIS — Z90722 Acquired absence of ovaries, bilateral: Secondary | ICD-10-CM | POA: Diagnosis not present

## 2021-04-24 DIAGNOSIS — Z8616 Personal history of COVID-19: Secondary | ICD-10-CM | POA: Diagnosis not present

## 2021-04-24 DIAGNOSIS — C541 Malignant neoplasm of endometrium: Secondary | ICD-10-CM | POA: Diagnosis present

## 2021-04-24 DIAGNOSIS — I10 Essential (primary) hypertension: Secondary | ICD-10-CM | POA: Insufficient documentation

## 2021-04-24 DIAGNOSIS — Z90710 Acquired absence of both cervix and uterus: Secondary | ICD-10-CM | POA: Insufficient documentation

## 2021-04-24 DIAGNOSIS — Z9221 Personal history of antineoplastic chemotherapy: Secondary | ICD-10-CM | POA: Diagnosis not present

## 2021-04-24 DIAGNOSIS — F329 Major depressive disorder, single episode, unspecified: Secondary | ICD-10-CM | POA: Insufficient documentation

## 2021-04-24 DIAGNOSIS — Z6841 Body Mass Index (BMI) 40.0 and over, adult: Secondary | ICD-10-CM | POA: Diagnosis not present

## 2021-04-24 DIAGNOSIS — J4540 Moderate persistent asthma, uncomplicated: Secondary | ICD-10-CM

## 2021-04-24 LAB — HEMOGLOBIN A1C
Hgb A1c MFr Bld: 6.2 % of total Hgb — ABNORMAL HIGH (ref <5.7)
Mean Plasma Glucose: 131 mg/dL
eAG (mmol/L): 7.3 mmol/L

## 2021-04-24 LAB — C-REACTIVE PROTEIN: CRP: 44.6 mg/L — ABNORMAL HIGH (ref <8.0)

## 2021-04-24 LAB — SEDIMENTATION RATE: Sed Rate: 39 mm/h — ABNORMAL HIGH (ref 0–30)

## 2021-04-24 NOTE — Progress Notes (Signed)
Gynecologic Oncology Interval Visit   Referring Provider: Dr. Marcelline Mates  Chief Complaint: FIGO Stage IIIc1 grade 2 endometrioid endometrial cancer  Subjective:  Molly [name] is a 70 y.o. female, initially seen in consultation for Dr. Marcelline Mates, grade 2 endometrioid endometrial cancer, s/p TLH-BSO with SLN mapping and biopsies on 10/06/18 with Dr. Fransisca Connors and Dr. Marcelline Mates at [facility], followed by chemotherapy and radiation with vaginal cuff boost, who returns to clinic today for continued surveillance.   CA 125 has been followed by Dr. Janese Banks.  02/04/2019 11.4 07/18/2019 10.2 10/05/2019 9.6 07/13/2020 10.1 01/29/21  13  No new complaints. Neuropathy better with Lyrica/Gabapentin  Gynecologic Oncology History:  She has history of Grade 3 uterine prolapse with Grade 1 cystocele and rectocele. She noted pessary had become dislodged after coughing d/t pneumonia and presented to Dr. Marcelline Mates for evaluation 05/2018. At that time she noted post-menopausal bleeding and cramping and has history of cervical polyps. Bleeding was felt to be possibly related to pessary.   Ultrasound revealed: uterus measuring 10.2 x 5.5 x 5.7 cm, heterogenous with evidence of fibroids invading endometrium measuring 2.9 x 3.3 x 3.6 cm and two additional fibroids measuring 2.0 x 2.1 x 1.8, and 2.9 x 3.3 x 3.6. Endometrium measuring 4.7 mm. Neither ovary was visualized.   She had second episode of bleeding 08/31/18-09/04/18, felt to be heavier and more 'period like'. Endometrial biopsy was performed. Pathology: - Endometrioid adenocarcinoma, figo grade 1.   Pathology:  DIAGNOSIS:  A. UTERUS WITH CERVIX AND BILATERAL FALLOPIAN TUBES AND OVARIES; TOTAL HYSTERECTOMY WITH BILATERAL SALPINGO-OOPHORECTOMY:  - ENDOMETRIAL ADENOCARCINOMA.  - SEE CANCER SUMMARY BELOW.  - CERVIX WITH NABOTHIAN CYSTS, CHRONIC CERVICITIS WITH SQUAMOUS METAPLASIA, AND 0.9 CM ENDOCERVICAL POLYP.  - INACTIVE BACKGROUND ENDOMETRIUM WITH ENDOMETRIAL POLYP.  - MYOMETRIUM WITH  ADENOMYOSIS AND LEIOMYOMATA, LARGEST MEASURING 4.5 CM.  - UNREMARKABLE FALLOPIAN TUBES AND OVARIES.   B. SENTINEL LYMPH NODE, LEFT OBTURATOR; EXCISION:  - METASTATIC CARCINOMA INVOLVES ONE LYMPH NODE (1/1).   C. SENTINEL LYMPH NODE, LEFT EXTERNAL ILIAC; EXCISION:  - LYMPHOID TISSUE NOT PRESENT.  - NEGATIVE FOR MALIGNANCY.   D. SENTINEL LYMPH NODE, RIGHT EXTERNAL ILIAC; EXCISION:  - METASTATIC CARCINOMA INVOLVES ONE OF TWO LYMPH NODES (1/2).   LVSI present and atypical washings.  Invasion 6/18 and cervix and adnexa negative.   MLH1: LOSS of protein expression  MSH2: Intact nuclear expression  MSH6: Intact nuclear expression  PMS2: LOSS of protein expression   MLH1- positive methylation HER-2 negative  Pathologic Stage: FIGO stage IIIC1 grade 2  PET CT scan showed metastatic adenopathy and retroperitoneal and external iliac group of lymph nodes in the right side. No evidence of metastatic disease elsewhere.  She received 6 cycles of carbo-taxol on 11/05/2018-02/25/2019. She completed radiation to pelvic and and periaortic nodes 03-31-2019-05/06/2019 with boost to vaginal cuff after 3rd cycle of chemotherapy. She completed on 06/15/2019.   She had a covid infection in September 2020. Port has been removed.   Problem List: Patient Active Problem List   Diagnosis Date Noted  . Major depression in remission ([place]) 04/23/2021  . Morbid obesity ([place]) 04/23/2021  . Peripheral neuropathy due to chemotherapy ([place]) 04/23/2021  . Myalgia 03/20/2021  . 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 Iron deficiency anemia 11/15/2018  . Endometrial adenocarcinoma ([place]) 10/29/2018  . Umbilical hernia without obstruction and without gangrene   . Atherosclerosis of right coronary artery 11/10/2017  . Atherosclerosis of abdominal aorta ([place]) 11/10/2017  .  Prediabetes 10/02/2017  . Perennial allergic rhinitis with seasonal variation 05/14/2016   . Asthma, mild intermittent, well-controlled 05/14/2016  . Hypertension goal BP (blood pressure) < 140/90 12/10/2015  . Cardiomyopathy due to hypertension ([place]) 12/10/2015  . History of CVA (cerebrovascular accident) 12/10/2015  . Hyperlipidemia LDL goal <70 12/10/2015  . Statin intolerance 12/10/2015    Past Medical History: Past Medical History:  Diagnosis Date  . Anxiety   . Asthma    history of asthma  . Cardiomyopathy ([place])   . Complication of anesthesia    tore hair out and made teeth rough  . COVID-19 virus infection 09/08/2019  . Depression   . Endometrial cancer ([place]) 08/2018  . GERD (gastroesophageal reflux disease)    takes prilosec prn  . History of CVA (cerebrovascular accident) 12/10/2015  . Hyperlipidemia LDL goal <70 12/10/2015  . Hypertension   . Prediabetes 10/02/2017   A1c 6 in January 2018  . Stroke [facility]) 1990    no residual effects    Past Surgical History: Past Surgical History:  Procedure Laterality Date  . ABDOMINAL HYSTERECTOMY    . CHOLECYSTECTOMY    . COLONOSCOPY WITH PROPOFOL N/A 01/08/2017   Procedure: COLONOSCOPY WITH PROPOFOL;  Surgeon: [name], MD;  Location: [facility] ENDOSCOPY;  Service: Endoscopy;  Laterality: N/A;  . COLONOSCOPY WITH PROPOFOL N/A 05/05/2018   Procedure: COLONOSCOPY WITH PROPOFOL;  Surgeon: [name], MD;  Location: [facility] ENDOSCOPY;  Service: Gastroenterology;  Laterality: N/A;  . HERNIA REPAIR  [id number]   umbilical  . PORTACATH PLACEMENT N/A 11/04/2018   Procedure: INSERTION PORT-A-CATH;  Surgeon: Jules Husbands, MD;  Location: [facility] ORS;  Service: General;  Laterality: N/A;  . SENTINEL NODE BIOPSY N/A 10/06/2018   Procedure: SENTINEL NODE BIOPSY;  Surgeon: Mellody Drown, MD;  Location: [facility] ORS;  Service: Gynecology;  Laterality: N/A;  . UMBILICAL HERNIA REPAIR N/A 11/17/2017   Procedure: HERNIA REPAIR UMBILICAL ADULT;  Surgeon: Jules Husbands, MD;  Location: [facility] ORS;  Service: General;  Laterality: N/A;   Past  Gynecologic History:  [id number] Denies abnormal pap smears.  History of OCP use.  Denies STD history  OB History:  OB History  Gravida Para Term Preterm AB Living  2 2 2     2   SAB IAB Ectopic Multiple Live Births          2    # Outcome Date GA Lbr Len/2nd Weight Sex Delivery Anes PTL Lv  2 Term 1    F Vag-Spont   LIV  1 Term 20    F Vag-Spont   LIV    Family History: Family History  Problem Relation Age of Onset  . Stroke Mother   . Hypertension Mother   . Dementia Mother   . Alzheimer's disease Mother   . Gout Father   . Asthma Father   . Hypertension Father   . Dementia Father   . Healthy Sister   . Stroke Brother   . Alzheimer's disease Brother   . Healthy Daughter   . Hypertension Brother   . Healthy Brother   . Cancer Paternal Grandmother   . Healthy Sister   . Healthy Sister   . Hypertension Sister   . Hypertension Sister   . Stroke Brother   . Alzheimer's disease Brother   . Stroke Brother   . Hypertension Brother   . Stroke Brother   . Hypertension Brother   . Healthy Brother   . Healthy Brother   . Hypertension Daughter   .  Breast cancer Neg Hx    Social History: Social History   Socioeconomic History  . Marital status: Divorced    Spouse name: Not on file  . Number of children: 2  . Years of education: some college  . Highest education level: 12th grade  Occupational History  . 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 Occupation: currently a Theme park manager  Tobacco Use  . Smoking status: Never Smoker  . Smokeless tobacco: Never Used  . Tobacco comment: smoking cessation materials not required  Vaping Use  . Vaping Use: Never used  Substance and Sexual Activity  . Alcohol use: No    Alcohol/week: 0.0 standard drinks  . Drug use: No  . Sexual activity: Not Currently  Other Topics Concern  . Not on file  Social History Narrative  . Not on file   Social Determinants of Health   Financial Resource Strain: Low  Risk   . Difficulty of Paying Living Expenses: Not hard at all  Food Insecurity: No Food Insecurity  . Worried About Charity fundraiser in the Last Year: Never true  . Ran Out of Food in the Last Year: Never true  Transportation Needs: No Transportation Needs  . Lack of Transportation (Medical): No  . Lack of Transportation (Non-Medical): No  Physical Activity: Inactive  . Days of Exercise per Week: 0 days  . Minutes of Exercise per Session: 0 min  Stress: No Stress Concern Present  . Feeling of Stress : Not at all  Social Connections: Moderately Integrated  . Frequency of Communication with Friends and Family: More than three times a week  . Frequency of Social Gatherings with Friends and Family: Three times a week  . Attends Religious Services: More than 4 times per year  . Active Member of Clubs or Organizations: Yes  . Attends Archivist Meetings: More than 4 times per year  . Marital Status: Divorced  Human resources officer Violence: Not At Risk  . Fear of Current or Ex-Partner: No  . Emotionally Abused: No  . Physically Abused: No  . Sexually Abused: No    Allergies: Allergies  Allergen Reactions  . Ferumoxytol Anaphylaxis  . Liraglutide Other (See Comments)    pancreatitis  . Nsaids Hives, Rash and Nausea And Vomiting  . Statins Other (See Comments) and Nausea And Vomiting    Joint pains  . Oxycodone Nausea And Vomiting  . Crestor [Rosuvastatin Calcium] Rash    Current Medications: Current Outpatient Medications  Medication Sig Dispense Refill  . acetaminophen (TYLENOL) 500 MG tablet Take 2 tablets (1,000 mg total) by mouth every 6 (six) hours as needed for mild pain or moderate pain. 30 tablet 0  . albuterol (VENTOLIN HFA) 108 (90 Base) MCG/ACT inhaler INHALE 2 PUFFS INTO THE LUNGS EVERY 4 HOURS AS NEEDED FOR WHEEZING OR SHORTNESS OF BREATH 8.5 g 1  . clopidogrel (PLAVIX) 75 MG tablet Take 75 mg by mouth daily.    . diclofenac Sodium (VOLTAREN) 1 % GEL Apply  4 grams to the skin four times daily. 300 g 0  . escitalopram (LEXAPRO) 10 MG tablet Take 1 tablet (10 mg total) by mouth daily. 90 tablet 1  . fluticasone furoate-vilanterol (BREO ELLIPTA) 100-25 MCG/INH AEPB Inhale 1 puff into the lungs daily. 180 each 1  . furosemide (LASIX) 40 MG tablet Take 1 tablet by mouth daily. (Patient taking differently: as needed.) 30 tablet 0  . ketoconazole (NIZORAL) 2 %  cream Apply 1 application topically daily as needed for irritation. On abdominal fold prn 60 g 0  . levocetirizine (XYZAL) 5 MG tablet Take 1 tablet (5 mg total) by mouth every evening. 90 tablet 1  . olmesartan-hydrochlorothiazide (BENICAR HCT) 20-12.5 MG tablet Take 1 tablet by mouth daily. 90 tablet 1  . pantoprazole (PROTONIX) 20 MG tablet Take 1 tablet by mouth daily. (Patient taking differently: Take 20 mg by mouth daily as needed.) 90 tablet 3  . potassium chloride SA (KLOR-CON) 20 MEQ tablet Take 1 tablet by mouth daily. 90 tablet 3  . pregabalin (LYRICA) 100 MG capsule Take 1 capsule (100 mg total) by mouth 2 (two) times daily. 180 capsule 1  . Evolocumab (REPATHA) 140 MG/ML SOSY Inject 140 mg into the skin every 14 (fourteen) days. 4.2 mL 5   No current facility-administered medications for this visit.   Review of Systems General:  no complaints Skin: no complaints Eyes: no complaints HEENT: no complaints Breasts: no complaints Pulmonary: no complaints Cardiac: no complaints Gastrointestinal: no complaints Genitourinary/Sexual: no complaints Ob/Gyn: no complaints Musculoskeletal: no complaints Hematology: no complaints Neurologic/Psych: persistent neuropathic like tingling of hands and feet   Objective:  Physical Examination:  BP (!) 152/75   Pulse 75   Temp 97.8 F (36.6 C)   Resp 18   Wt 236 lb 9.6 oz (107.3 kg)   SpO2 97%   BMI 47.79 kg/m     ECOG Performance Status: 0 - Asymptomatic  GENERAL: Patient is a well appearing female in no acute distress HEENT:   Sclera clear. Anicteric NODES:  Negative axillary, supraclavicular, inguinal lymph node survery LUNGS:  Clear to auscultation bilaterally.   HEART:  Regular rate and rhythm.  ABDOMEN:  Soft, nontender.  No hernias, incisions well healed. No masses or ascites EXTREMITIES:  No peripheral edema. Atraumatic. No cyanosis SKIN:  Clear with no obvious rashes or skin changes.  NEURO:  Nonfocal. Well oriented.  Appropriate affect.  Pelvic: chaperoned by nursing EGBUS: no lesions Vagina: cuff well healed.  Bimanual/RV: no masses  Assessment:  Molly [name] is a 70 y.o. female diagnosed with stage IIIC1 grade 2 endometrial cancer with bilateral positive pelvic SLNs with macroscopic tumor s/p TLH/BSO pelvic SLN biopsies 10/06/18.  Received sandwich therapy with 3 cycles of carboplatin/taxol, external pelvic radiation with vaginal brachytherapy and then 3 more cycles of chemotherapy completed 6/20.  Normal Exam today.   Persistent peripheral neuropathy due to taxol, but better with Lyrica/gabapentin.    COVID19 infection 9/20.  Tumor has MLH1 loss with promoter methylation. HER-2 negative.   Medical co-morbidities complicating care: Morbid obesity, CVA 1990 with no residual and no medication, HTN with good BP today.    Plan:   Problem List Items Addressed This Visit      Genitourinary   Endometrial adenocarcinoma ([place]) - Primary     Continue follow up with Dr. Janese Banks and Dr. Baruch Gouty.  Dr Janese Banks will continue to manage her neuropathy.  We will see her back in 6 months. We will see her back sooner if any symptoms arise.   Tumor has MLH1 loss with promoter methylation, so she would be a candidate for immune checkpoint inhibitor therapy if she has a recurrence in the future. HER-2 negative.   She will get COVID19 vaccine next week.   Mellody Drown, MD  CC:  Steele Sizer, [place] [address] [place],  [place] [address] [phone]

## 2021-04-26 ENCOUNTER — Telehealth: Payer: Self-pay | Admitting: Cardiology

## 2021-04-26 MED ORDER — CLOPIDOGREL BISULFATE 75 MG PO TABS: 75.0000 mg | ORAL_TABLET | Freq: Every day | ORAL | 8 refills | Status: DC

## 2021-04-26 NOTE — Telephone Encounter (Signed)
Received fax from [place] requesting refills for Plavix 75 mg. Rx request sent to pharmacy.

## 2021-04-30 ENCOUNTER — Telehealth: Payer: Self-pay | Admitting: Cardiology

## 2021-04-30 ENCOUNTER — Telehealth: Payer: Self-pay

## 2021-04-30 DIAGNOSIS — R600 Localized edema: Secondary | ICD-10-CM

## 2021-04-30 MED ORDER — CLOPIDOGREL BISULFATE 75 MG PO TABS: 75.0000 mg | ORAL_TABLET | Freq: Every day | ORAL | 1 refills | Status: DC

## 2021-04-30 NOTE — Telephone Encounter (Signed)
Rx request sent to pharmacy.  

## 2021-04-30 NOTE — Telephone Encounter (Signed)
*  STAT* If patient is at the pharmacy, call can be transferred to refill team.   1. Which medications need to be refilled? (please list name of each medication and dose if known) Plavix  2. Which pharmacy/location (including street and city if local pharmacy) is medication to be sent to?[facility]   3. Do they need a 30 day or 90 day supply? [place]

## 2021-04-30 NOTE — Progress Notes (Signed)
Chronic Care Management Pharmacy Assistant   Name: Molly [name]  MRN: [id number] DOB: [birth year]  Reason for Encounter: Medication Review/Medication Coordination Call.   Recent office visits:  04/23/2021 Dr Ancil Boozer MD (PCP)   Recent consult visits:  04/24/2021 Dr. Fransisca Connors MD (Oncology)   Hospital visits:  None in previous 6 months  Medications: Outpatient Encounter Medications as of 04/30/2021  Medication Sig  . acetaminophen (TYLENOL) 500 MG tablet Take 2 tablets (1,000 mg total) by mouth every 6 (six) hours as needed for mild pain or moderate pain.  Marland Kitchen albuterol (VENTOLIN HFA) 108 (90 Base) MCG/ACT inhaler INHALE 2 PUFFS INTO THE LUNGS EVERY 4 HOURS AS NEEDED FOR WHEEZING OR SHORTNESS OF BREATH  . clopidogrel (PLAVIX) 75 MG tablet Take 1 tablet (75 mg total) by mouth daily.  . diclofenac Sodium (VOLTAREN) 1 % GEL Apply 4 grams to the skin four times daily.  Marland Kitchen escitalopram (LEXAPRO) 10 MG tablet Take 1 tablet (10 mg total) by mouth daily.  . Evolocumab (REPATHA) 140 MG/ML SOSY Inject 140 mg into the skin every 14 (fourteen) days.  . fluticasone furoate-vilanterol (BREO ELLIPTA) 100-25 MCG/INH AEPB Inhale 1 puff into the lungs daily.  . furosemide (LASIX) 40 MG tablet Take 1 tablet by mouth daily. (Patient taking differently: as needed.)  . ketoconazole (NIZORAL) 2 % cream Apply 1 application topically daily as needed for irritation. On abdominal fold prn  . levocetirizine (XYZAL) 5 MG tablet Take 1 tablet (5 mg total) by mouth every evening.  . olmesartan-hydrochlorothiazide (BENICAR HCT) 20-12.5 MG tablet Take 1 tablet by mouth daily.  . pantoprazole (PROTONIX) 20 MG tablet Take 1 tablet by mouth daily. (Patient taking differently: Take 20 mg by mouth daily as needed.)  . potassium chloride SA (KLOR-CON) 20 MEQ tablet Take 1 tablet by mouth daily.  . pregabalin (LYRICA) 100 MG capsule Take 1 capsule (100 mg total) by mouth 2 (two) times daily.   No facility-administered  encounter medications on file as of 04/30/2021.    Star Rating Drugs: None ID  Reviewed chart for medication changes ahead of medication coordination call.  BP Readings from Last 3 Encounters:  04/24/21 (!) 152/75  04/23/21 128/76  03/20/21 114/66    Lab Results  Component Value Date   HGBA1C 6.2 (H) 04/23/2021     Patient obtains medications through Adherence Packaging  30 Days   Last adherence delivery included:None ID   Patient declined medications last month None ID Patient is due for next adherence delivery on: 05/09/2021. Called patient and reviewed medications and coordinated delivery.  This delivery to include:  Clopidogrel 75 mg 1 tablet daily-  evening meals  Escitalopram 10 mg one tablet daily-  Breakfast  Pregabalin 100 mg one Capsule two times daily- Breakfast,Evening meals  Levocetirizine 5 mg one tablet daily - evening meals  Breo 100-25 MCG/ Inhaler - 1 puff daily  Coenzyme Q10 one capsule Daily - Breakfast  Pantoprazole  20 mg- PRN  Potassium Chloride 20 MEQ 1 tablet daily- evening meals    Patient declined the following medications   Olmesartan-HCTZ 20-12.5 mg one tablet daily - (adequate supply, patient receive 30 day supply on 04/08/2021 from [place] and another 30 day supply on 04/10/2021 from [place].)  Ventolin HFA -PRN  Furosemide 40 mg daily PRN  Repatha 420 mg every 30 days (patient states she was approved and she received Repatha from insurance company)  Patient needs refills for Clopidogrel - (reached out to Cardiology to request  a refill on 04/30/2021 [facility]) ,Pantoprazole,Potassium Chloride 20 MEQ.  Confirmed delivery date of 05/09/2021 , advised patient that pharmacy will contact them the morning of delivery.  [place] Pharmacist Assistant [phone]

## 2021-05-01 ENCOUNTER — Other Ambulatory Visit: Payer: Self-pay | Admitting: Family Medicine

## 2021-05-01 DIAGNOSIS — J302 Other seasonal allergic rhinitis: Secondary | ICD-10-CM

## 2021-05-01 DIAGNOSIS — J3089 Other allergic rhinitis: Secondary | ICD-10-CM

## 2021-05-01 DIAGNOSIS — K219 Gastro-esophageal reflux disease without esophagitis: Secondary | ICD-10-CM

## 2021-05-01 NOTE — Telephone Encounter (Signed)
Requested Prescriptions  Pending Prescriptions Disp Refills  . pantoprazole (PROTONIX) 20 MG tablet [Pharmacy Med Name: Pantoprazole Sod 20mg  DR Tablet] 30 tablet 11    Sig: Take 1 tablet by mouth daily     Gastroenterology: Proton Pump Inhibitors Passed - 05/01/2021  2:04 AM      Passed - Valid encounter within last 12 months    Recent Outpatient Visits          1 week ago Primary hypertension   [facility] Steele Sizer, MD   1 month ago Viral upper respiratory tract infection   [facility] Steele Sizer, MD   1 month ago [place], DO   3 months ago [facility] Steele Sizer, MD   5 months ago Hospital discharge follow-up   [facility] Steele Sizer, MD      Future Appointments            In 6 days  [facility], [place]   In 3 months Steele Sizer, MD [facility], [place]           . potassium chloride (KLOR-CON) 10 MEQ tablet [Pharmacy Med Name: Potassium Chloride 78mEq Extended-Release Tablet] 60 tablet 11    Sig: Take 2 tablets by mouth daily     Endocrinology:  Minerals - Potassium Supplementation Passed - 05/01/2021  2:04 AM      Passed - K in normal range and within 360 days    Potassium  Date Value Ref Range Status  01/29/2021 3.8 3.5 - 5.1 mmol/L Final         Passed - Cr in normal range and within 360 days    Creat  Date Value Ref Range Status  11/07/2020 0.85 0.50 - 0.99 mg/dL Final    Comment:    For patients >59 years of age, the reference limit for Creatinine is approximately 13% higher for people identified as African-American. .    Creatinine, Ser  Date Value Ref Range Status  01/29/2021 0.79 0.44 - 1.00 mg/dL Final         Passed - Valid encounter within last 12 months    Recent Outpatient Visits          1 week ago Primary hypertension   [facility] [place], Drue Stager, MD   1 month ago Viral upper respiratory tract infection   [facility] Steele Sizer, MD   1 month ago [place], DO   3 months ago [facility] Steele Sizer, MD   5 months ago Hospital discharge follow-up   [facility] Steele Sizer, MD      Future Appointments            In 6 days  [facility], [place]   In 3 months Steele Sizer, MD [facility], [facility]           . levocetirizine (XYZAL) 5 MG tablet [Pharmacy Med Name: Levocetirizine 5mg  Tablet] 30 tablet 11    Sig: Take 1 tablet by mouth every evening     Ear, Nose, and Throat:  Antihistamines Passed - 05/01/2021  2:04 AM      Passed - Valid encounter within last 12 months    Recent Outpatient Visits  1 week ago Primary hypertension   [facility] [place], Drue Stager, MD   1 month ago Viral upper respiratory tract infection   [facility] Steele Sizer, MD   1 month ago [place], DO   3 months ago [facility] Steele Sizer, MD   5 months ago Hospital discharge follow-up   [facility] Steele Sizer, MD      Future Appointments            In 6 days  [facility] [place], [place]   In 3 months Steele Sizer, MD [facility], [facility]

## 2021-05-02 NOTE — Addendum Note (Signed)
Addended by: Daron Offer A on: 05/02/2021 08:53 AM   Modules accepted: Orders

## 2021-05-03 MED ORDER — POTASSIUM CHLORIDE CRYS ER 20 MEQ PO TBCR: 20.0000 meq | EXTENDED_RELEASE_TABLET | Freq: Every day | ORAL | 1 refills | Status: DC

## 2021-05-03 NOTE — Addendum Note (Signed)
Addended by: Daron Offer A on: 05/03/2021 02:58 PM   Modules accepted: Orders

## 2021-05-07 ENCOUNTER — Ambulatory Visit (INDEPENDENT_AMBULATORY_CARE_PROVIDER_SITE_OTHER): Payer: Medicare Other

## 2021-05-07 DIAGNOSIS — Z780 Asymptomatic menopausal state: Secondary | ICD-10-CM | POA: Diagnosis not present

## 2021-05-07 DIAGNOSIS — Z1211 Encounter for screening for malignant neoplasm of colon: Secondary | ICD-10-CM

## 2021-05-07 DIAGNOSIS — Z0000 Encounter for general adult medical examination without abnormal findings: Secondary | ICD-10-CM | POA: Diagnosis not present

## 2021-05-07 NOTE — Progress Notes (Signed)
Subjective:   Molly [name] is a 70 y.o. female who presents for Medicare Annual (Subsequent) preventive examination.  Virtual Visit via Telephone Note  I connected with  Molly [name] on [id number] at  8:40 AM EDT by telephone and verified that I am speaking with the correct person using two identifiers.  Location: Patient: home Provider: [place] Persons participating in the virtual visit: [place]   I discussed the limitations, risks, security and privacy concerns of performing an evaluation and management service by telephone and the availability of in person appointments. The patient expressed understanding and agreed to proceed.  Interactive audio and video telecommunications were attempted between this nurse and patient, however failed, due to patient having technical difficulties OR patient did not have access to video capability.  We continued and completed visit with audio only.  Some vital signs may be absent or patient reported.   Clemetine Marker, LPN    Review of Systems     Cardiac Risk Factors include: advanced age (>41men, >6 women);dyslipidemia;hypertension;obesity (BMI >30kg/m2)     Objective:    Today's Vitals   05/07/21 0854  PainSc: 4    There is no height or weight on file to calculate BMI.  Advanced Directives 05/07/2021 04/24/2021 11/20/2020 08/29/2020 07/13/2020 07/13/2020 05/03/2020  Does Patient Have a Medical Advance Directive? No No No No No Yes Yes  Type of Advance Directive - - - - - Press photographer;Living will Molly [name];Living will  Does patient want to make changes to medical advance directive? No - Patient declined Yes (MAU/Ambulatory/Procedural Areas - Information given) No - Patient declined No - Patient declined No - Patient declined Yes (MAU/Ambulatory/Procedural Areas - Information given) -  Copy of [place] in Chart? - - - - - - No - copy requested  Would patient like  information on creating a medical advance directive? - - No - Patient declined No - Patient declined - - -    Current Medications (verified) Outpatient Encounter Medications as of 05/07/2021  Medication Sig  . acetaminophen (TYLENOL) 500 MG tablet Take 2 tablets (1,000 mg total) by mouth every 6 (six) hours as needed for mild pain or moderate pain.  Marland Kitchen albuterol (VENTOLIN HFA) 108 (90 Base) MCG/ACT inhaler INHALE 2 PUFFS INTO THE LUNGS EVERY 4 HOURS AS NEEDED FOR WHEEZING OR SHORTNESS OF BREATH  . clopidogrel (PLAVIX) 75 MG tablet Take 1 tablet (75 mg total) by mouth daily.  . diclofenac Sodium (VOLTAREN) 1 % GEL Apply 4 grams to the skin four times daily.  Marland Kitchen escitalopram (LEXAPRO) 10 MG tablet Take 1 tablet (10 mg total) by mouth daily.  . Evolocumab (REPATHA) 140 MG/ML SOSY Inject 140 mg into the skin every 14 (fourteen) days.  . fluticasone furoate-vilanterol (BREO ELLIPTA) 100-25 MCG/INH AEPB Inhale 1 puff into the lungs daily.  . furosemide (LASIX) 40 MG tablet Take 1 tablet by mouth daily. (Patient taking differently: as needed.)  . ketoconazole (NIZORAL) 2 % cream Apply 1 application topically daily as needed for irritation. On abdominal fold prn  . levocetirizine (XYZAL) 5 MG tablet Take 1 tablet by mouth every evening  . olmesartan-hydrochlorothiazide (BENICAR HCT) 20-12.5 MG tablet Take 1 tablet by mouth daily.  . pantoprazole (PROTONIX) 20 MG tablet Take 1 tablet by mouth daily  . potassium chloride SA (KLOR-CON) 20 MEQ tablet Take 1 tablet (20 mEq total) by mouth daily.  . pregabalin (LYRICA) 100 MG capsule Take 1 capsule (100  mg total) by mouth 2 (two) times daily.  Marland Kitchen ezetimibe (ZETIA) 10 MG tablet Take 10 mg by mouth daily. (Patient not taking: Reported on 05/07/2021)   No facility-administered encounter medications on file as of 05/07/2021.    Allergies (verified) Ferumoxytol, Liraglutide, Nsaids, Statins, Oxycodone, and Crestor [rosuvastatin calcium]   History: Past Medical  History:  Diagnosis Date  . Anxiety   . Asthma    history of asthma  . Cardiomyopathy ([place])   . Complication of anesthesia    tore hair out and made teeth rough  . COVID-19 virus infection 09/08/2019  . Depression   . Endometrial cancer ([place]) 08/2018  . GERD (gastroesophageal reflux disease)    takes prilosec prn  . History of CVA (cerebrovascular accident) 12/10/2015  . Hyperlipidemia LDL goal <70 12/10/2015  . Hypertension   . Prediabetes 10/02/2017   A1c 6 in January 2018  . Stroke [facility]) 1990    no residual effects   Past Surgical History:  Procedure Laterality Date  . ABDOMINAL HYSTERECTOMY    . CHOLECYSTECTOMY    . COLONOSCOPY WITH PROPOFOL N/A 01/08/2017   Procedure: COLONOSCOPY WITH PROPOFOL;  Surgeon: [name], MD;  Location: [facility] ENDOSCOPY;  Service: Endoscopy;  Laterality: N/A;  . COLONOSCOPY WITH PROPOFOL N/A 05/05/2018   Procedure: COLONOSCOPY WITH PROPOFOL;  Surgeon: [name], MD;  Location: [facility] ENDOSCOPY;  Service: Gastroenterology;  Laterality: N/A;  . HERNIA REPAIR  89/2119   umbilical  . PORTACATH PLACEMENT N/A 11/04/2018   Procedure: INSERTION PORT-A-CATH;  Surgeon: Jules Husbands, MD;  Location: [facility] ORS;  Service: General;  Laterality: N/A;  . SENTINEL NODE BIOPSY N/A 10/06/2018   Procedure: SENTINEL NODE BIOPSY;  Surgeon: Mellody Drown, MD;  Location: [facility] ORS;  Service: Gynecology;  Laterality: N/A;  . UMBILICAL HERNIA REPAIR N/A 11/17/2017   Procedure: HERNIA REPAIR UMBILICAL ADULT;  Surgeon: Jules Husbands, MD;  Location: [facility] ORS;  Service: General;  Laterality: N/A;   Family History  Problem Relation Age of Onset  . Stroke Mother   . Hypertension Mother   . Dementia Mother   . Alzheimer's disease Mother   . Gout Father   . Asthma Father   . Hypertension Father   . Dementia Father   . Healthy Sister   . Stroke Brother   . Alzheimer's disease Brother   . Healthy Daughter   . Hypertension Brother   . Healthy Brother   . Cancer Paternal  Grandmother   . Healthy Sister   . Healthy Sister   . Hypertension Sister   . Hypertension Sister   . Stroke Brother   . Alzheimer's disease Brother   . Stroke Brother   . Hypertension Brother   . Stroke Brother   . Hypertension Brother   . Healthy Brother   . Healthy Brother   . Hypertension Daughter   . Breast cancer Neg Hx    Social History   Socioeconomic History  . Marital status: Divorced    Spouse name: Not on file  . Number of children: 2  . Years of education: some college  . Highest education level: 12th grade  Occupational History  . 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 Occupation: currently a Theme park manager  Tobacco Use  . Smoking status: Never Smoker  . Smokeless tobacco: Never Used  . Tobacco comment: smoking cessation materials not required  Vaping Use  . Vaping Use: Never used  Substance and Sexual Activity  .  Alcohol use: No    Alcohol/week: 0.0 standard drinks  . Drug use: No  . Sexual activity: Not Currently  Other Topics Concern  . Not on file  Social History Narrative   Pt lives alone but currently staying with her daughter   Social Determinants of Health   Financial Resource Strain: Low Risk   . Difficulty of Paying Living Expenses: Not hard at all  Food Insecurity: No Food Insecurity  . Worried About Charity fundraiser in the Last Year: Never true  . Ran Out of Food in the Last Year: Never true  Transportation Needs: No Transportation Needs  . Lack of Transportation (Medical): No  . Lack of Transportation (Non-Medical): No  Physical Activity: Inactive  . Days of Exercise per Week: 0 days  . Minutes of Exercise per Session: 0 min  Stress: No Stress Concern Present  . Feeling of Stress : Not at all  Social Connections: Moderately Integrated  . Frequency of Communication with Friends and Family: More than three times a week  . Frequency of Social Gatherings with Friends and Family: Three times a week  . Attends  Religious Services: More than 4 times per year  . Active Member of Clubs or Organizations: Yes  . Attends Archivist Meetings: More than 4 times per year  . Marital Status: Divorced    Tobacco Counseling Counseling given: Not Answered Comment: smoking cessation materials not required   Clinical Intake:  Pre-visit preparation completed: Yes  Pain : 0-10 Pain Score: 4  Pain Type: Chronic pain Pain Location: Arm Pain Orientation: Right Pain Descriptors / Indicators: Aching,Sore Pain Onset: More than a month ago Pain Frequency: Intermittent     Nutritional Status: BMI > 30  Obese Nutritional Risks: None Diabetes: No  How often do you need to have someone help you when you read instructions, pamphlets, or other written materials from your doctor or pharmacy?: 1 - Never   Interpreter Needed?: No  Information entered by :: Clemetine Marker LPN   Activities of Daily Living In your present state of health, do you have any difficulty performing the following activities: 05/07/2021 04/23/2021  Hearing? N N  Comment declines hearing aids -  Vision? N N  Difficulty concentrating or making decisions? N N  Walking or climbing stairs? Y Y  Dressing or bathing? N N  Doing errands, shopping? N N  Preparing Food and eating ? N -  Using the Toilet? N -  In the past six months, have you accidently leaked urine? Y -  Comment wears depends for protection -  Do you have problems with loss of bowel control? N -  Managing your Medications? N -  Managing your Finances? N -  Housekeeping or managing your Housekeeping? N -  Some recent data might be hidden    Patient Care Team: Steele Sizer, MD as PCP - General (Family Medicine) Kate Sable, MD as PCP - Cardiology (Cardiology) Rubie Maid, MD as Consulting Physician (Obstetrics and Gynecology) Clent Jacks, RN as Registered Nurse Noreene Filbert, MD as Referring Physician (Radiation Oncology) Sindy Guadeloupe, MD  as Consulting Physician (Oncology) Mellody Drown, MD as Referring Physician (Obstetrics) Germaine Pomfret, [facility] (Pharmacist) Neldon Labella, RN as Case Manager Sunday Corn, Autumn Hubbard [facility] (Neurology)  Indicate any recent Medical Services you may have received from other than [facility] providers in the past year (date may be approximate).     Assessment:   This is a routine wellness examination for Dylan.  Hearing/Vision screen  Hearing Screening   125Hz  250Hz  500Hz  1000Hz  2000Hz  3000Hz  4000Hz  6000Hz  8000Hz   Right ear:           Left ear:           Comments: Pt denies hearing difficulty  Vision Screening Comments: Annual vision screenings done by [facility]  Dietary issues and exercise activities discussed: Current Exercise Habits: The patient does not participate in regular exercise at present, Exercise limited by: orthopedic condition(s)  Goals Addressed            This Visit's Progress   . DIET - INCREASE WATER INTAKE   On track    Recommend to drink at least 6-8 8oz glasses of water per day.      Depression Screen PHQ 2/9 Scores 05/07/2021 04/23/2021 03/28/2021 03/20/2021 01/02/2021 11/07/2020 10/15/2020  PHQ - 2 Score 0 0 0 0 0 0 0  PHQ- 9 Score - 0 - - 0 0 0    Fall Risk Fall Risk  05/07/2021 04/23/2021 03/28/2021 03/20/2021 01/02/2021  Falls in the past year? 0 0 0 0 1  Number falls in past yr: 0 0 0 0 1  Injury with Fall? 0 0 0 0 0  Risk for fall due to : No Fall Risks - - - History of fall(s)  Risk for fall due to: Comment - - - - -  Follow up Falls prevention discussed - - - -    FALL RISK PREVENTION PERTAINING TO THE HOME:  Any stairs in or around the home? Yes  If so, are there any without handrails? No  Home free of loose throw rugs in walkways, pet beds, electrical cords, etc? Yes  Adequate lighting in your home to reduce risk of falls? Yes   ASSISTIVE DEVICES UTILIZED TO PREVENT FALLS:  Life alert? No  Use of a cane, walker or w/c? No  Grab bars  in the bathroom? No  Shower chair or bench in shower? No  Elevated toilet seat or a handicapped toilet? No   TIMED UP AND GO:  Was the test performed? No .   Cognitive Function: Normal cognitive status assessed by direct observation by this Nurse Health Advisor. No abnormalities found.       6CIT Screen 05/03/2020 04/07/2019 04/06/2018  What Year? 0 points 0 points 0 points  What month? 0 points 0 points 0 points  What time? 0 points 0 points 3 points  Count back from 20 0 points 0 points 0 points  Months in reverse 0 points 0 points 0 points  Repeat phrase 0 points 0 points 0 points  Total Score 0 0 3    Immunizations Immunization History  Administered Date(s) Administered  . Fluad Quad(high Dose 65+) 10/05/2019, 10/15/2020  . Influenza, High Dose Seasonal PF 11/11/2016, 10/02/2017, 09/07/2018  . Pneumococcal Conjugate-13 04/06/2018  . Pneumococcal Polysaccharide-23 11/11/2016  . Tdap 11/11/2016    TDAP status: Up to date  Flu Vaccine status: Up to date  Pneumococcal vaccine status: Up to date  Covid-19 vaccine status: Declined, Education has been provided regarding the importance of this vaccine but patient still declined. Advised may receive this vaccine at local pharmacy or Health Dept.or vaccine clinic. Aware to provide a copy of the vaccination record if obtained from local pharmacy or Health Dept. Verbalized acceptance and understanding.  Qualifies for Shingles Vaccine? Yes   Zostavax completed No   Shingrix Completed?: No.    Education has been provided regarding the importance of  this vaccine. Patient has been advised to call insurance company to determine out of pocket expense if they have not yet received this vaccine. Advised may also receive vaccine at local pharmacy or Health Dept. Verbalized acceptance and understanding.  Screening Tests Health Maintenance  Topic Date Due  . MAMMOGRAM  04/23/2021  . COLONOSCOPY (Pts 45-31yrs Insurance coverage will need to be  confirmed)  05/05/2021  . COVID-19 Vaccine (1) 05/23/2021 (Originally 11/08/1963)  . INFLUENZA VACCINE  07/29/2021  . TETANUS/TDAP  11/11/2026  . DEXA SCAN  Completed  . Hepatitis C Screening  Completed  . PNA vac Low Risk Adult  Completed  . HPV VACCINES  Aged Out    Health Maintenance  Health Maintenance Due  Topic Date Due  . MAMMOGRAM  04/23/2021  . COLONOSCOPY (Pts 45-87yrs Insurance coverage will need to be confirmed)  05/05/2021    Colorectal cancer screening: Type of screening: Colonoscopy. Completed 05/05/18. Repeat every 3 years  Mammogram status: Completed 04/18/20. Repeat every year  Bone Density status: Completed 02/02/17. Results reflect: Bone density results: OSTEOPENIA. Repeat every 2 years.  Lung Cancer Screening: (Low Dose CT Chest recommended if Age 43-80 years, 30 pack-year currently smoking OR have quit w/in 15years.) does not qualify.   Additional Screening:  Hepatitis C Screening: does qualify; Completed 01/23/17  Vision Screening: Recommended annual ophthalmology exams for early detection of glaucoma and other disorders of the eye. Is the patient up to date with their annual eye exam?  Yes  Who is the provider or what is the name of the office in which the patient attends annual eye exams? [facility].   Dental Screening: Recommended annual dental exams for proper oral hygiene  Community Resource Referral / Chronic Care Management: CRR required this visit?  No   CCM required this visit?  No      Plan:     I have personally reviewed and noted the following in the patient's chart:   . Medical and social history . Use of alcohol, tobacco or illicit drugs  . Current medications and supplements including opioid prescriptions.  . Functional ability and status . Nutritional status . Physical activity . Advanced directives . List of other physicians . Hospitalizations, surgeries, and ER visits in previous 12 months . Vitals . Screenings to  include cognitive, depression, and falls . Referrals and appointments  In addition, I have reviewed and discussed with patient certain preventive protocols, quality metrics, and best practice recommendations. A written personalized care plan for preventive services as well as general preventive health recommendations were provided to patient.     Clemetine Marker, LPN   2/70/3500   Nurse Notes: none

## 2021-05-07 NOTE — Patient Instructions (Signed)
[name] , Thank you for taking time to come for your Medicare Wellness Visit. I appreciate your ongoing commitment to your health goals. Please review the following plan we discussed and let me know if I can assist you in the future.   Screening recommendations/referrals: Colonoscopy: done 05/05/18. Repeat 04/2021. Referral sent to [facility] today for repeat screening colonoscopy. They will contact you for an appointment.  Mammogram: done 04/23/20. Please call [phone] to schedule your mammogram and bone density screening.  Bone Density: done 02/02/17 Recommended yearly ophthalmology/optometry visit for glaucoma screening and checkup Recommended yearly dental visit for hygiene and checkup  Vaccinations: Influenza vaccine: done 10/15/20 Pneumococcal vaccine: done 04/06/18 Tdap vaccine: done 11/11/16 Shingles vaccine: Shingrix discussed. Please contact your pharmacy for coverage information.  Covid-19: declined  Advanced directives: Please bring a copy of your health care power of attorney and living will to the office at your convenience once you have completed that paperwork.   Conditions/risks identified: Recommend increasing physical activity   Next appointment: Follow up in one year for your annual wellness visit    Preventive Care 65 Years and Older, Female Preventive care refers to lifestyle choices and visits with your health care provider that can promote health and wellness. What does preventive care include?  A yearly physical exam. This is also called an annual well check.  Dental exams once or twice a year.  Routine eye exams. Ask your health care provider how often you should have your eyes checked.  Personal lifestyle choices, including:  Daily care of your teeth and gums.  Regular physical activity.  Eating a healthy diet.  Avoiding tobacco and drug use.  Limiting alcohol use.  Practicing safe sex.  Taking low-dose aspirin every  day.  Taking vitamin and mineral supplements as recommended by your health care provider. What happens during an annual well check? The services and screenings done by your health care provider during your annual well check will depend on your age, overall health, lifestyle risk factors, and family history of disease. Counseling  Your health care provider may ask you questions about your:  Alcohol use.  Tobacco use.  Drug use.  Emotional well-being.  Home and relationship well-being.  Sexual activity.  Eating habits.  History of falls.  Memory and ability to understand (cognition).  Work and work Statistician.  Reproductive health. Screening  You may have the following tests or measurements:  Height, weight, and BMI.  Blood pressure.  Lipid and cholesterol levels. These may be checked every 5 years, or more frequently if you are over 41 years old.  Skin check.  Lung cancer screening. You may have this screening every year starting at age 58 if you have a 30-pack-year history of smoking and currently smoke or have quit within the past 15 years.  Fecal occult blood test (FOBT) of the stool. You may have this test every year starting at age 72.  Flexible sigmoidoscopy or colonoscopy. You may have a sigmoidoscopy every 5 years or a colonoscopy every 10 years starting at age 77.  Hepatitis C blood test.  Hepatitis B blood test.  Sexually transmitted disease (STD) testing.  Diabetes screening. This is done by checking your blood sugar (glucose) after you have not eaten for a while (fasting). You may have this done every 1-3 years.  Bone density scan. This is done to screen for osteoporosis. You may have this done starting at age 31.  Mammogram. This may be done every 1-2 years. Talk to  your health care provider about how often you should have regular mammograms. Talk with your health care provider about your test results, treatment options, and if necessary, the need  for more tests. Vaccines  Your health care provider may recommend certain vaccines, such as:  Influenza vaccine. This is recommended every year.  Tetanus, diphtheria, and acellular pertussis (Tdap, Td) vaccine. You may need a Td booster every 10 years.  Zoster vaccine. You may need this after age 74.  Pneumococcal 13-valent conjugate (PCV13) vaccine. One dose is recommended after age 75.  Pneumococcal polysaccharide (PPSV23) vaccine. One dose is recommended after age 41. Talk to your health care provider about which screenings and vaccines you need and how often you need them. This information is not intended to replace advice given to you by your health care provider. Make sure you discuss any questions you have with your health care provider. Document Released: 01/11/2016 Document Revised: 09/03/2016 Document Reviewed: 10/16/2015 Elsevier Interactive Patient Education  2017 [employer] Prevention in the Home Falls can cause injuries. They can happen to people of all ages. There are many things you can do to make your home safe and to help prevent falls. What can I do on the outside of my home?  Regularly fix the edges of walkways and driveways and fix any cracks.  Remove anything that might make you trip as you walk through a door, such as a raised step or threshold.  Trim any bushes or trees on the path to your home.  Use bright outdoor lighting.  Clear any walking paths of anything that might make someone trip, such as rocks or tools.  Regularly check to see if handrails are loose or broken. Make sure that both sides of any steps have handrails.  Any raised decks and porches should have guardrails on the edges.  Have any leaves, snow, or ice cleared regularly.  Use sand or salt on walking paths during winter.  Clean up any spills in your garage right away. This includes oil or grease spills. What can I do in the bathroom?  Use night lights.  Install grab bars  by the toilet and in the tub and shower. Do not use towel bars as grab bars.  Use non-skid mats or decals in the tub or shower.  If you need to sit down in the shower, use a plastic, non-slip stool.  Keep the floor dry. Clean up any water that spills on the floor as soon as it happens.  Remove soap buildup in the tub or shower regularly.  Attach bath mats securely with double-sided non-slip rug tape.  Do not have throw rugs and other things on the floor that can make you trip. What can I do in the bedroom?  Use night lights.  Make sure that you have a light by your bed that is easy to reach.  Do not use any sheets or blankets that are too big for your bed. They should not hang down onto the floor.  Have a firm chair that has side arms. You can use this for support while you get dressed.  Do not have throw rugs and other things on the floor that can make you trip. What can I do in the kitchen?  Clean up any spills right away.  Avoid walking on wet floors.  Keep items that you use a lot in easy-to-reach places.  If you need to reach something above you, use a strong step stool that has  a grab bar.  Keep electrical cords out of the way.  Do not use floor polish or wax that makes floors slippery. If you must use wax, use non-skid floor wax.  Do not have throw rugs and other things on the floor that can make you trip. What can I do with my stairs?  Do not leave any items on the stairs.  Make sure that there are handrails on both sides of the stairs and use them. Fix handrails that are broken or loose. Make sure that handrails are as long as the stairways.  Check any carpeting to make sure that it is firmly attached to the stairs. Fix any carpet that is loose or worn.  Avoid having throw rugs at the top or bottom of the stairs. If you do have throw rugs, attach them to the floor with carpet tape.  Make sure that you have a light switch at the top of the stairs and the  bottom of the stairs. If you do not have them, ask someone to add them for you. What else can I do to help prevent falls?  Wear shoes that:  Do not have high heels.  Have rubber bottoms.  Are comfortable and fit you well.  Are closed at the toe. Do not wear sandals.  If you use a stepladder:  Make sure that it is fully opened. Do not climb a closed stepladder.  Make sure that both sides of the stepladder are locked into place.  Ask someone to hold it for you, if possible.  Clearly mark and make sure that you can see:  Any grab bars or handrails.  First and last steps.  Where the edge of each step is.  Use tools that help you move around (mobility aids) if they are needed. These include:  Canes.  Walkers.  Scooters.  Crutches.  Turn on the lights when you go into a dark area. Replace any light bulbs as soon as they burn out.  Set up your furniture so you have a clear path. Avoid moving your furniture around.  If any of your floors are uneven, fix them.  If there are any pets around you, be aware of where they are.  Review your medicines with your doctor. Some medicines can make you feel dizzy. This can increase your chance of falling. Ask your doctor what other things that you can do to help prevent falls. This information is not intended to replace advice given to you by your health care provider. Make sure you discuss any questions you have with your health care provider. Document Released: 10/11/2009 Document Revised: 05/22/2016 Document Reviewed: 01/19/2015 Elsevier Interactive Patient Education  2017 [employer].

## 2021-05-15 ENCOUNTER — Ambulatory Visit (INDEPENDENT_AMBULATORY_CARE_PROVIDER_SITE_OTHER): Payer: Medicare Other

## 2021-05-15 DIAGNOSIS — E785 Hyperlipidemia, unspecified: Secondary | ICD-10-CM

## 2021-05-15 DIAGNOSIS — I10 Essential (primary) hypertension: Secondary | ICD-10-CM

## 2021-05-15 DIAGNOSIS — J4521 Mild intermittent asthma with (acute) exacerbation: Secondary | ICD-10-CM

## 2021-05-15 NOTE — Chronic Care Management (AMB) (Signed)
Chronic Care Management   Initial Visit Note  [id number]/5883 Name: Molly [name] MRN: [id number] DOB: 1951/04/24  Primary Care Provider: Steele Sizer, MD Reason for referral : Chronic Care Management   Molly [name] is a 70 y.o. year old female who is a primary care patient of Steele Sizer, MD. The CCM team was consulted for assistance with chronic disease management and care coordination. A telephonic outreach was conducted today.  Review of Molly [name]'s status, including review of consultants reports, relevant labs and test results was conducted today. Collaboration with appropriate care team members was performed as part of the comprehensive evaluation and provision of chronic care management services.    SDOH (Social Determinants of Health) assessments performed: Yes See Care Plan activities for detailed interventions related to SDOH  SDOH Interventions   Flowsheet Row Most Recent Value  SDOH Interventions   Food Insecurity Interventions Intervention Not Indicated  Transportation Interventions Intervention Not Indicated       Medications: Outpatient Encounter Medications as of 05/15/2021  Medication Sig Note  . acetaminophen (TYLENOL) 500 MG tablet Take 2 tablets (1,000 mg total) by mouth every 6 (six) hours as needed for mild pain or moderate pain.   Marland Kitchen albuterol (VENTOLIN HFA) 108 (90 Base) MCG/ACT inhaler INHALE 2 PUFFS INTO THE LUNGS EVERY 4 HOURS AS NEEDED FOR WHEEZING OR SHORTNESS OF BREATH   . clopidogrel (PLAVIX) 75 MG tablet Take 1 tablet (75 mg total) by mouth daily.   . diclofenac Sodium (VOLTAREN) 1 % GEL Apply 4 grams to the skin four times daily.   Marland Kitchen escitalopram (LEXAPRO) 10 MG tablet Take 1 tablet (10 mg total) by mouth daily.   . Evolocumab (REPATHA) 140 MG/ML SOSY Inject 140 mg into the skin every 14 (fourteen) days. 05/07/2021: Pt taking once a month  . fluticasone furoate-vilanterol (BREO ELLIPTA) 100-25 MCG/INH AEPB Inhale 1 puff into the lungs daily.   Marland Kitchen  ketoconazole (NIZORAL) 2 % cream Apply 1 application topically daily as needed for irritation. On abdominal fold prn   . levocetirizine (XYZAL) 5 MG tablet Take 1 tablet by mouth every evening   . olmesartan-hydrochlorothiazide (BENICAR HCT) 20-12.5 MG tablet Take 1 tablet by mouth daily.   . pantoprazole (PROTONIX) 20 MG tablet Take 1 tablet by mouth daily   . potassium chloride SA (KLOR-CON) 20 MEQ tablet Take 1 tablet (20 mEq total) by mouth daily.   . pregabalin (LYRICA) 100 MG capsule Take 1 capsule (100 mg total) by mouth 2 (two) times daily.   Marland Kitchen ezetimibe (ZETIA) 10 MG tablet Take 10 mg by mouth daily. (Patient not taking: Reported on 05/07/2021)   . furosemide (LASIX) 40 MG tablet Take 1 tablet by mouth daily. (Patient taking differently: as needed.)    No facility-administered encounter medications on file as of 05/15/2021.        Objective:    Patient Care Plan: Asthma (Adult)  Problem Identified: Symptom Exacerbation (Asthma)     Long-Range Goal: Symptom Exacerbation Prevented or Minimized   Start Date: 05/15/2021  Expected End Date: 09/12/2021  Priority: High  Note:    Current Barriers:  . Chronic Disease Management support and educational needs r/t Asthma  Case Manager Clinical Goal(s): Marland Kitchen Over the next 120 days, patient will not require hospitalization for complications r/t Asthma exacerbation   Interventions:  . Collaboration with Steele Sizer, MD regarding development and update of comprehensive plan of care as evidenced by provider attestation and co-signature . Inter-disciplinary care team collaboration (see longitudinal  plan of care) . Discussed current plan for asthma management and exacerbation prevention. Reports symptoms are usually more severe during the Spring and Fall. Reports significant improvement since starting Breo. Has an occasional cough. Overall doing well. Activity is limited d/t joint pain. Declines recent decline or change in activity tolerance.  Encouraged to continue taking medications as prescribed. Encouraged to take needed precautions to prevent exacerbation. . Discussed importance of daily self-assessment. Advised to notify a provider if experiencing symptoms frequently and inhalers seem less effective. Reviewed worsening symptoms that require immediate medical attention.  . Provided information regarding infection prevention and increased risk r/t Asthma.  Advised to utilize prevention strategies to reduce risk of respiratory infection.    Self-Care Activities/Patient Goals: -Take medications and utilize inhalers as prescribed -Assess symptoms daily  -Follow recommendations to prevent respiratory infection -Notify provider or care management team with questions and new concerns as needed   Follow Up Plan:  Will follow up next month         Patient Care Plan: Hypertension and Hyperlipidemia  Problem Identified: Hypertension and Hyperlipidemia     Long-Range Goal: Hypertensionand Hyperlipidemia Monitored   Start Date: 05/15/2021  Expected End Date: 09/12/2021  Priority: High  Note:   Objective:  . Last practice recorded BP readings:  BP Readings from Last 3 Encounters:  04/24/21 (!) 152/75  04/23/21 128/76  03/20/21 114/66 .   Marland Kitchen Most recent eGFR/CrCl: No results found for: EGFR  No components found for: CRCL  Lab Results  Component Value Date   CHOL 218 (H) 10/15/2020   HDL 63 10/15/2020   LDLCALC 130 (H) 10/15/2020   TRIG 134 10/15/2020   CHOLHDL 3.5 10/15/2020     Current Barriers:  . Chronic Disease Management support and educational needs r/t Hypertension and Dyslipidemia.  Case Manager Clinical Goal(s):  Marland Kitchen Over the next 120 days, patient will demonstrate improved adherence to prescribed treatment plan as evidenced by taking all medications as prescribed, monitoring and recording blood pressure, and adhering to a cardiac prudent/heart healthy diet.   Interventions:  . Collaboration with Steele Sizer, MD regarding development and update of comprehensive plan of care as evidenced by provider attestation and co-signature . Inter-disciplinary care team collaboration (see longitudinal plan of care) . Reviewed medications. Reports taking as prescribed. Encouraged to continue taking as prescribed and notify provider if unable to tolerate prescribed regimen. Encouraged to update the CCM Pharmacist with concerns regarding prescription cost. . Provided information regarding established blood pressure parameters along with indications for notifying a provider. Encouraged to monitor and record readings. . Discussed compliance with recommended cardiac prudent diet. Encouraged to read nutrition labels and avoid highly processed foods when possible. . Reviewed s/sx of heart attack, stroke and worsening symptoms that require immediate medical attention.    Patient Goals/Self-Care Activities: -Self-administer medications as prescribed -Attend all scheduled provider appointments -Monitor and record blood pressure -Adhere to recommended cardiac prudent/heart healthy diet -Notify provider or care management team with questions and new concerns as needed    Follow Up Plan:  -Will follow up next month      Ms. Thede was given information about Chronic Care Management services including:  1. CCM service includes personalized support from designated clinical staff supervised by her physician, including individualized plan of care and coordination with other care providers 2. 24/7 contact phone numbers for assistance for urgent and routine care needs. 3. Service will only be billed when office clinical staff spend 20 minutes or more in a  month to coordinate care. 4. Only one practitioner may furnish and bill the service in a calendar month. 5. The patient may stop CCM services at any time (effective at the end of the month) by phone call to the office staff. 6. The patient will be responsible for  cost sharing (co-pay) of up to 20% of the service fee (after annual deductible is met).  Patient agreed to services and verbal consent obtained.       PLAN A member of the care management team will follow-up with Ms. Bagdasarian next month.     Cristy [facility]lander [facility] Care Management [facility] [phone]

## 2021-05-17 DIAGNOSIS — M353 Polymyalgia rheumatica: Secondary | ICD-10-CM | POA: Diagnosis not present

## 2021-05-17 DIAGNOSIS — R7982 Elevated C-reactive protein (CRP): Secondary | ICD-10-CM | POA: Diagnosis not present

## 2021-05-17 DIAGNOSIS — T466X5D Adverse effect of antihyperlipidemic and antiarteriosclerotic drugs, subsequent encounter: Secondary | ICD-10-CM | POA: Diagnosis not present

## 2021-05-19 NOTE — Patient Instructions (Signed)
Thank you for allowing the Chronic Care Management team to participate in your care. It was pleasure speaking with you today. Please feel free to contact me with questions.    Goals Addressed: Patient Care Plan: Asthma (Adult)  Problem Identified: Symptom Exacerbation (Asthma)     Long-Range Goal: Symptom Exacerbation Prevented or Minimized   Start Date: 05/15/2021  Expected End Date: 09/12/2021  Priority: High  Note:    Current Barriers:  . Chronic Disease Management support and educational needs r/t Asthma  Case Manager Clinical Goal(s): Marland Kitchen Over the next 120 days, patient will not require hospitalization for complications r/t Asthma exacerbation   Interventions:  . Collaboration with Steele Sizer, MD regarding development and update of comprehensive plan of care as evidenced by provider attestation and co-signature . Inter-disciplinary care team collaboration (see longitudinal plan of care) . Discussed current plan for asthma management and exacerbation prevention. Reports symptoms are usually more severe during the Spring and Fall. Reports significant improvement since starting Breo. Has an occasional cough. Overall doing well. Activity is limited d/t joint pain. Declines recent decline or change in activity tolerance. Encouraged to continue taking medications as prescribed. Encouraged to take needed precautions to prevent exacerbation. . Discussed importance of daily self-assessment. Advised to notify a provider if experiencing symptoms frequently and inhalers seem less effective. Reviewed worsening symptoms that require immediate medical attention.  . Provided information regarding infection prevention and increased risk r/t Asthma.  Advised to utilize prevention strategies to reduce risk of respiratory infection.    Self-Care Activities/Patient Goals: -Take medications and utilize inhalers as prescribed -Assess symptoms daily  -Follow recommendations to prevent respiratory  infection -Notify provider or care management team with questions and new concerns as needed   Follow Up Plan:  Will follow up next month         Patient Care Plan: Hypertension and Hyperlipidemia  Problem Identified: Hypertension and Hyperlipidemia     Long-Range Goal: Hypertensionand Hyperlipidemia Monitored   Start Date: 05/15/2021  Expected End Date: 09/12/2021  Priority: High  Note:   Objective:  . Last practice recorded BP readings:  BP Readings from Last 3 Encounters:  04/24/21 (!) 152/75  04/23/21 128/76  03/20/21 114/66 .   Marland Kitchen Most recent eGFR/CrCl: No results found for: EGFR  No components found for: CRCL  Lab Results  Component Value Date   CHOL 218 (H) 10/15/2020   HDL 63 10/15/2020   LDLCALC 130 (H) 10/15/2020   TRIG 134 10/15/2020   CHOLHDL 3.5 10/15/2020     Current Barriers:  . Chronic Disease Management support and educational needs r/t Hypertension and Dyslipidemia.  Case Manager Clinical Goal(s):  Marland Kitchen Over the next 120 days, patient will demonstrate improved adherence to prescribed treatment plan as evidenced by taking all medications as prescribed, monitoring and recording blood pressure, and adhering to a cardiac prudent/heart healthy diet.   Interventions:  . Collaboration with Steele Sizer, MD regarding development and update of comprehensive plan of care as evidenced by provider attestation and co-signature . Inter-disciplinary care team collaboration (see longitudinal plan of care) . Reviewed medications. Reports taking as prescribed. Encouraged to continue taking as prescribed and notify provider if unable to tolerate prescribed regimen. Encouraged to update the CCM Pharmacist with concerns regarding prescription cost. . Provided information regarding established blood pressure parameters along with indications for notifying a provider. Encouraged to monitor and record readings. . Discussed compliance with recommended cardiac prudent diet.  Encouraged to read nutrition labels and avoid highly processed foods  when possible. . Reviewed s/sx of heart attack, stroke and worsening symptoms that require immediate medical attention.    Patient Goals/Self-Care Activities: -Self-administer medications as prescribed -Attend all scheduled provider appointments -Monitor and record blood pressure -Adhere to recommended cardiac prudent/heart healthy diet -Notify provider or care management team with questions and new concerns as needed    Follow Up Plan:  -Will follow up next month      Ms. Bramlett was given information about Chronic Care Management services including:  1. CCM service includes personalized support from designated clinical staff supervised by her physician, including individualized plan of care and coordination with other care providers 2. 24/7 contact phone numbers for assistance for urgent and routine care needs. 3. Service will only be billed when office clinical staff spend 20 minutes or more in a month to coordinate care. 4. Only one practitioner may furnish and bill the service in a calendar month. 5. The patient may stop CCM services at any time (effective at the end of the month) by phone call to the office staff. 6. The patient will be responsible for cost sharing (co-pay) of up to 20% of the service fee (after annual deductible is met).  Patient agreed to services and verbal consent obtained.        Ms. Christian verbalized understanding of the information discussed during the telephonic outreach today. Declined need for mailed/printed instructions. Agreeable to receiving educational information regarding diet/nutrition. A member of the care management team will follow-up next month.     Cristy [facility] Care Management [facility] [phone]

## 2021-05-21 ENCOUNTER — Other Ambulatory Visit: Payer: Self-pay | Admitting: Family Medicine

## 2021-05-21 DIAGNOSIS — I10 Essential (primary) hypertension: Secondary | ICD-10-CM

## 2021-05-23 ENCOUNTER — Other Ambulatory Visit: Payer: Self-pay | Admitting: Family Medicine

## 2021-05-23 DIAGNOSIS — F325 Major depressive disorder, single episode, in full remission: Secondary | ICD-10-CM

## 2021-05-23 DIAGNOSIS — I43 Cardiomyopathy in diseases classified elsewhere: Secondary | ICD-10-CM

## 2021-05-23 DIAGNOSIS — I119 Hypertensive heart disease without heart failure: Secondary | ICD-10-CM

## 2021-05-23 DIAGNOSIS — R600 Localized edema: Secondary | ICD-10-CM

## 2021-05-23 DIAGNOSIS — M170 Bilateral primary osteoarthritis of knee: Secondary | ICD-10-CM

## 2021-05-23 DIAGNOSIS — I10 Essential (primary) hypertension: Secondary | ICD-10-CM

## 2021-05-23 NOTE — Telephone Encounter (Signed)
Requested Prescriptions  Pending Prescriptions Disp Refills  . furosemide (LASIX) 40 MG tablet [Pharmacy Med Name: Furosemide 40mg  Tablet] 90 tablet 1    Sig: Take 1 tablet by mouth daily     Cardiovascular:  Diuretics - Loop Failed - 05/23/2021  2:05 AM      Failed - Last BP in normal range    BP Readings from Last 1 Encounters:  04/24/21 (!) 152/75         Passed - K in normal range and within 360 days    Potassium  Date Value Ref Range Status  01/29/2021 3.8 3.5 - 5.1 mmol/L Final         Passed - Ca in normal range and within 360 days    Calcium  Date Value Ref Range Status  01/29/2021 9.6 8.9 - 10.3 mg/dL Final         Passed - Na in normal range and within 360 days    Sodium  Date Value Ref Range Status  01/29/2021 139 135 - 145 mmol/L Final         Passed - Cr in normal range and within 360 days    Creat  Date Value Ref Range Status  11/07/2020 0.85 0.50 - 0.99 mg/dL Final    Comment:    For patients >74 years of age, the reference limit for Creatinine is approximately 13% higher for people identified as African-American. .    Creatinine, Ser  Date Value Ref Range Status  01/29/2021 0.79 0.44 - 1.00 mg/dL Final         Passed - Valid encounter within last 6 months    Recent Outpatient Visits          1 month ago Primary hypertension   [facility] [place], Drue Stager, MD   1 month ago Viral upper respiratory tract infection   [facility] Steele Sizer, MD   2 months ago [place], DO   4 months ago [facility] Steele Sizer, MD   6 months ago Hospital discharge follow-up   [facility] Steele Sizer, MD      Future Appointments            In 1 month  [place]   In 3 months Steele Sizer, MD [facility], [place]   In 11 months  [facility]            . diclofenac Sodium (VOLTAREN) 1 % GEL [Pharmacy Med Name: Diclofenac Sod 1% Topical Gel] 300 g 2    Sig: Apply 4 grams to the skin 4 times daily. Need appt.     Analgesics:  Topicals Passed - 05/23/2021  2:05 AM      Passed - Valid encounter within last 12 months    Recent Outpatient Visits          1 month ago Primary hypertension   [facility] Steele Sizer, MD   1 month ago Viral upper respiratory tract infection   [facility] Steele Sizer, MD   2 months ago [place], DO   4 months ago [facility] Steele Sizer, MD   6 months ago Hospital discharge follow-up   [facility] Steele Sizer, MD      Future Appointments  In 1 month  [place]   In 3 months [place], Drue Stager, MD [facility], [place]   In 11 months  [place]           . escitalopram (LEXAPRO) 10 MG tablet [Pharmacy Med Name: Escitalopram 10mg  Tablet] 90 tablet 1    Sig: Take 1 tablet by mouth daily     Psychiatry:  Antidepressants - SSRI Passed - 05/23/2021  2:05 AM      Passed - Completed PHQ-2 or PHQ-9 in the last 360 days      Passed - Valid encounter within last 6 months    Recent Outpatient Visits          1 month ago Primary hypertension   [facility] Steele Sizer, MD   1 month ago Viral upper respiratory tract infection   [facility] Steele Sizer, MD   2 months ago [place], DO   4 months ago [facility] Steele Sizer, MD   6 months ago Hospital discharge follow-up   [facility] Steele Sizer, MD      Future Appointments            In 1 month  [place]   In 3 months Steele Sizer, MD [facility], [place]   In 11 months  [facility], [facility]

## 2021-05-31 ENCOUNTER — Telehealth: Payer: Self-pay

## 2021-05-31 DIAGNOSIS — I10 Essential (primary) hypertension: Secondary | ICD-10-CM

## 2021-05-31 DIAGNOSIS — R600 Localized edema: Secondary | ICD-10-CM

## 2021-05-31 DIAGNOSIS — I43 Cardiomyopathy in diseases classified elsewhere: Secondary | ICD-10-CM

## 2021-05-31 DIAGNOSIS — I119 Hypertensive heart disease without heart failure: Secondary | ICD-10-CM

## 2021-05-31 DIAGNOSIS — L304 Erythema intertrigo: Secondary | ICD-10-CM

## 2021-05-31 MED ORDER — KETOCONAZOLE 2 % EX CREA
1.0000 | TOPICAL_CREAM | Freq: Every day | CUTANEOUS | 0 refills | Status: DC | PRN
Start: 2021-05-31 — End: 2021-06-27

## 2021-05-31 MED ORDER — FUROSEMIDE 40 MG PO TABS: 40.0000 mg | ORAL_TABLET | Freq: Every day | ORAL | 0 refills | Status: DC

## 2021-05-31 NOTE — Progress Notes (Signed)
Chronic Care Management Pharmacy Assistant   Name: Molly [name]  MRN: [id number] DOB: 12-31-1950  Reason for Encounter: Medication Review/Medication Coordination Call.   Recent office visits:  05/15/2021 Neldon Labella RN (CCM) 05/07/2021 Clemetine Marker LPN (PCP Office) Referral sent to [facility]   Recent consult visits:  05/17/2021 Dr.Patel MD (Rheumatology)-  Prednisone taper instructions -  (DELTASONE) 5 MG tabletTake 4 tablets (20 mg total) by mouth once daily for 14 days, THEN 3 tablets (15 mg total) once daily for 14 days, THEN 2 tablets (10 mg total) once daily for 14 days, THEN 1 tablet (5 mg total) once daily for 14 days  Hospital visits:  None in previous 6 months  Medications: Outpatient Encounter Medications as of 05/31/2021  Medication Sig Note  . acetaminophen (TYLENOL) 500 MG tablet Take 2 tablets (1,000 mg total) by mouth every 6 (six) hours as needed for mild pain or moderate pain.   Marland Kitchen albuterol (VENTOLIN HFA) 108 (90 Base) MCG/ACT inhaler INHALE 2 PUFFS INTO THE LUNGS EVERY 4 HOURS AS NEEDED FOR WHEEZING OR SHORTNESS OF BREATH   . clopidogrel (PLAVIX) 75 MG tablet Take 1 tablet (75 mg total) by mouth daily.   . diclofenac Sodium (VOLTAREN) 1 % GEL Apply 4 grams to the skin 4 times daily. Need appt.   . escitalopram (LEXAPRO) 10 MG tablet Take 1 tablet by mouth daily   . Evolocumab (REPATHA) 140 MG/ML SOSY Inject 140 mg into the skin every 14 (fourteen) days. 05/07/2021: Pt taking once a month  . ezetimibe (ZETIA) 10 MG tablet Take 10 mg by mouth daily. (Patient not taking: Reported on 05/07/2021)   . fluticasone furoate-vilanterol (BREO ELLIPTA) 100-25 MCG/INH AEPB Inhale 1 puff into the lungs daily.   . furosemide (LASIX) 40 MG tablet Take 1 tablet by mouth daily   . ketoconazole (NIZORAL) 2 % cream Apply 1 application topically daily as needed for irritation. On abdominal fold prn   . levocetirizine (XYZAL) 5 MG tablet Take 1 tablet by mouth every  evening   . olmesartan-hydrochlorothiazide (BENICAR HCT) 20-12.5 MG tablet Take 1 tablet by mouth daily.   . pantoprazole (PROTONIX) 20 MG tablet Take 1 tablet by mouth daily   . potassium chloride SA (KLOR-CON) 20 MEQ tablet Take 1 tablet (20 mEq total) by mouth daily.   . pregabalin (LYRICA) 100 MG capsule Take 1 capsule (100 mg total) by mouth 2 (two) times daily.    No facility-administered encounter medications on file as of 05/31/2021.   Star Rating Drugs: Olmesartan-HCTZ 20-12.5 mg last filled on 05/03/2021 for 30 day supply at [place].   Reviewed chart for medication changes ahead of medication coordination call.  BP Readings from Last 3 Encounters:  04/24/21 (!) 152/75  04/23/21 128/76  03/20/21 114/66    Lab Results  Component Value Date   HGBA1C 6.2 (H) 04/23/2021     Patient obtains medications through Adherence Packaging  30 Days   Last adherence delivery included:   Clopidogrel 75 mg 1 tablet daily-  evening meals  Escitalopram 10 mg one tablet daily-  Breakfast  Pregabalin 100 mg one Capsule two times daily- Breakfast,Evening meals  Levocetirizine 5 mg one tablet daily - evening meals  Breo 100-25 MCG/ Inhaler - 1 puff daily  Coenzyme Q10 one capsule Daily - Breakfast  Pantoprazole  20 mg- PRN  Potassium Chloride 20 MEQ 1 tablet daily- evening meals    Patient declined medications last month   Olmesartan-HCTZ 20-12.5 mg  one tablet daily - (adequate supply, patient receive 30 day supply on 04/08/2021 from [place] and another 30 day supply on 04/10/2021 from [place].)  Ventolin HFA -PRN  Furosemide 40 mg daily PRN  Repatha 420 mg every 30 days (patient states she was approved and she received Repatha from insurance company)  Patient is due for next adherence delivery on: 06/10/2021. Called patient and reviewed medications and coordinated delivery.  This delivery to include:  Clopidogrel 75 mg 1 tablet daily-   evening meals  Escitalopram 10 mg one tablet daily-  Breakfast  Pregabalin 100 mg one Capsule two times daily- Breakfast,Evening meals  Levocetirizine 5 mg one tablet daily - evening meals  Olmesartan-HCTZ 20-12.5 mg one tablet daily   Furosemide 40 mg daily PRN  Ketoconazole 2% cream PRN   Patient declined the following medications:  Breo 100-25 MCG/ Inhaler - 1 puff daily  Coenzyme Q10 one capsule Daily (OTC)  Pantoprazole  20 mg- PRN  Potassium Chloride 20 MEQ 1 tablet daily- evening meals (last filled on 05/13 for 25 day supply from [facility] and on 05/10 for 30 day supply from [facility])  Repatha 420 mg every 30 days (patient states she was approved and she received Repatha from insurance company)  Ventolin HFA -PRN Patient needs refills for Ketoconazole 2% cream, Furosemide,.  Confirmed delivery date of 06/03/2021, advised patient that pharmacy will contact them the morning of delivery.  Patient is schedule for a telephone follow up with clinical pharmacist on 07/03/2021 at 2:00 pm .  Patient is schedule for a follow up with PCP on 08/26/2021.  [place] Pharmacist Assistant [phone]

## 2021-05-31 NOTE — Addendum Note (Signed)
Addended by: Daron Offer A on: 05/31/2021 04:27 PM   Modules accepted: Orders

## 2021-06-17 DIAGNOSIS — M353 Polymyalgia rheumatica: Secondary | ICD-10-CM | POA: Diagnosis not present

## 2021-06-20 ENCOUNTER — Other Ambulatory Visit: Payer: Self-pay | Admitting: Family Medicine

## 2021-06-20 DIAGNOSIS — M170 Bilateral primary osteoarthritis of knee: Secondary | ICD-10-CM

## 2021-06-20 DIAGNOSIS — I10 Essential (primary) hypertension: Secondary | ICD-10-CM

## 2021-06-20 NOTE — Telephone Encounter (Signed)
Notes to clinic:  medication filled by a historical provider  Review for refill    Requested Prescriptions  Pending Prescriptions Disp Refills   diclofenac Sodium (VOLTAREN) 1 % GEL [Pharmacy Med Name: Diclofenac Sod 1% Topical Gel] 300 g 11    Sig: Apply 4 grams to the skin 4 times daily. Need appt.      Analgesics:  Topicals Passed - 06/20/2021  2:04 AM      Passed - Valid encounter within last 12 months    Recent Outpatient Visits           1 month ago Primary hypertension   [facility] Steele Sizer, MD   2 months ago Viral upper respiratory tract infection   [facility] Steele Sizer, MD   3 months ago [place], DO   5 months ago [facility] Steele Sizer, MD   7 months ago Hospital discharge follow-up   [facility] Steele Sizer, MD       Future Appointments             In 1 week  [place]   In 2 months Steele Sizer, MD [facility], [place]   In 10 months  [facility]               ezetimibe (ZETIA) 10 MG tablet [Pharmacy Med Name: Ezetimibe 10mg  Tablet] 30 tablet 11    Sig: Take 1 tab by mouth daily. Need appt      Cardiovascular:  Antilipid - Sterol Transport Inhibitors Failed - 06/20/2021  2:04 AM      Failed - Total Cholesterol in normal range and within 360 days    Cholesterol  Date Value Ref Range Status  10/15/2020 218 (H) <200 mg/dL Final          Failed - LDL in normal range and within 360 days    LDL Cholesterol (Calc)  Date Value Ref Range Status  10/15/2020 130 (H) mg/dL (calc) Final    Comment:    Reference range: <100 . Desirable range <100 mg/dL for primary prevention;   <70 mg/dL for patients with CHD or diabetic patients  with > or = 2 CHD risk factors. Marland Kitchen LDL-C is now calculated using the Martin-Hopkins  calculation, which  is a validated novel method providing  better accuracy than the Friedewald equation in the  estimation of LDL-C.  Cresenciano Genre et al. Annamaria Helling. 1937;902(40): 2061-2068  (http://education.[employer].com/faq/FAQ164)           Passed - HDL in normal range and within 360 days    HDL  Date Value Ref Range Status  10/15/2020 63 > OR = 50 mg/dL Final          Passed - Triglycerides in normal range and within 360 days    Triglycerides  Date Value Ref Range Status  10/15/2020 134 <150 mg/dL Final          Passed - Valid encounter within last 12 months    Recent Outpatient Visits           1 month ago Primary hypertension   [facility] Steele Sizer, MD   2 months ago Viral upper respiratory tract infection   [facility] Steele Sizer, MD   3 months ago [place], [place], DO   5  months ago [facility] Steele Sizer, MD   7 months ago Hospital discharge follow-up   [facility] Steele Sizer, MD       Future Appointments             In 1 week  [place]   In 2 months Steele Sizer, MD [facility], [place]   In 10 months  [facility]               olmesartan-hydrochlorothiazide (BENICAR HCT) 20-12.5 MG tablet [Pharmacy Med Name: Olmesartan Medoxomil/Hydrochlorothiazide 20mg -12.5mg  Tablet] 30 tablet 11    Sig: Take 1 tablet by mouth daily.      Cardiovascular: ARB + Diuretic Combos Failed - 06/20/2021  2:04 AM      Failed - Last BP in normal range    BP Readings from Last 1 Encounters:  04/24/21 (!) 152/75          Passed - K in normal range and within 180 days    Potassium  Date Value Ref Range Status  01/29/2021 3.8 3.5 - 5.1 mmol/L Final          Passed - Na in normal range and within 180 days    Sodium  Date Value Ref Range Status  01/29/2021 139 135 - 145  mmol/L Final          Passed - Cr in normal range and within 180 days    Creat  Date Value Ref Range Status  11/07/2020 0.85 0.50 - 0.99 mg/dL Final    Comment:    For patients >42 years of age, the reference limit for Creatinine is approximately 13% higher for people identified as African-American. .    Creatinine, Ser  Date Value Ref Range Status  01/29/2021 0.79 0.44 - 1.00 mg/dL Final          Passed - Ca in normal range and within 180 days    Calcium  Date Value Ref Range Status  01/29/2021 9.6 8.9 - 10.3 mg/dL Final          Passed - Patient is not pregnant      Passed - Valid encounter within last 6 months    Recent Outpatient Visits           1 month ago Primary hypertension   [facility] [place], Drue Stager, MD   2 months ago Viral upper respiratory tract infection   [facility] Steele Sizer, MD   3 months ago [place], DO   5 months ago [facility] Steele Sizer, MD   7 months ago Hospital discharge follow-up   [facility] Steele Sizer, MD       Future Appointments             In 1 week  [place]   In 2 months Steele Sizer, MD [facility], [place]   In 10 months  [facility], [facility]

## 2021-06-20 NOTE — Telephone Encounter (Signed)
Last appt 04-23-2021 and followup 08-26-2021

## 2021-06-24 NOTE — Telephone Encounter (Signed)
Pt informed prescription has been sent to pharmacy 

## 2021-06-25 ENCOUNTER — Other Ambulatory Visit: Payer: Self-pay | Admitting: Family Medicine

## 2021-06-25 DIAGNOSIS — T451X5A Adverse effect of antineoplastic and immunosuppressive drugs, initial encounter: Secondary | ICD-10-CM

## 2021-06-25 DIAGNOSIS — G620 Drug-induced polyneuropathy: Secondary | ICD-10-CM

## 2021-06-25 DIAGNOSIS — J4521 Mild intermittent asthma with (acute) exacerbation: Secondary | ICD-10-CM

## 2021-06-26 ENCOUNTER — Ambulatory Visit (INDEPENDENT_AMBULATORY_CARE_PROVIDER_SITE_OTHER): Payer: Medicare Other

## 2021-06-26 DIAGNOSIS — J4520 Mild intermittent asthma, uncomplicated: Secondary | ICD-10-CM

## 2021-06-26 DIAGNOSIS — E785 Hyperlipidemia, unspecified: Secondary | ICD-10-CM | POA: Diagnosis not present

## 2021-06-26 NOTE — Chronic Care Management (AMB) (Signed)
Chronic Care Management   Follow Up Note   2/95/6213 Name: Molly [name] MRN: [id number] DOB: Jan 28, 1951  Primary Care Provider: Steele Sizer, MD Reason for referral : Chronic Care Management   Molly [name] is a 70 y.o. year old female who is a primary care patient of Steele Sizer, MD. She is currently enrolled in the Chronic Care Management program. A routine telephonic outreach was conducted today.  Review of Molly [name]'s status, including review of consultants reports, relevant labs and test results was conducted today. Collaboration with appropriate care team members was performed as part of the comprehensive evaluation and provision of chronic care management services.     SDOH (Social Determinants of Health) assessments performed: No    Outpatient Encounter Medications as of 06/26/2021  Medication Sig Note   acetaminophen (TYLENOL) 500 MG tablet Take 2 tablets (1,000 mg total) by mouth every 6 (six) hours as needed for mild pain or moderate pain.    albuterol (VENTOLIN HFA) 108 (90 Base) MCG/ACT inhaler Inhale 2 puffs into the lungs every 4 hours as needed for wheezing or shortness of breath    clopidogrel (PLAVIX) 75 MG tablet Take 1 tablet (75 mg total) by mouth daily.    diclofenac Sodium (VOLTAREN) 1 % GEL Apply 4 grams to the skin 4 times daily. Need appt.    escitalopram (LEXAPRO) 10 MG tablet Take 1 tablet by mouth daily    Evolocumab (REPATHA) 140 MG/ML SOSY Inject 140 mg into the skin every 14 (fourteen) days. 05/07/2021: Pt taking once a month   ezetimibe (ZETIA) 10 MG tablet Take 1 tab by mouth daily. Need appt 06/26/2021: Reports being unable to tolerate d/t joint pain.   fluticasone furoate-vilanterol (BREO ELLIPTA) 100-25 MCG/INH AEPB Inhale 1 puff into the lungs daily.    furosemide (LASIX) 40 MG tablet Take 1 tablet (40 mg total) by mouth daily. 06/26/2021: Reports taking as needed   ketoconazole (NIZORAL) 2 % cream Apply 1 application topically daily as  needed for irritation. On abdominal fold prn    levocetirizine (XYZAL) 5 MG tablet Take 1 tablet by mouth every evening    olmesartan-hydrochlorothiazide (BENICAR HCT) 20-12.5 MG tablet Take 1 tablet by mouth daily    pantoprazole (PROTONIX) 20 MG tablet Take 1 tablet by mouth daily    potassium chloride SA (KLOR-CON) 20 MEQ tablet Take 1 tablet (20 mEq total) by mouth daily.    pregabalin (LYRICA) 100 MG capsule Take 1 capsule by mouth twice daily    No facility-administered encounter medications on file as of 06/26/2021.     Objective:  Patient Care Plan: Asthma (Adult)     Problem Identified: Symptom Exacerbation (Asthma)      Long-Range Goal: Symptom Exacerbation Prevented or Minimized   Start Date: 05/15/2021  Expected End Date: 09/12/2021  Priority: High  Note:    Current Barriers:  Chronic Disease Management support and educational needs r/t Asthma  Case Manager Clinical Goal(s): Over the next 120 days, patient will not require hospitalization for complications r/t Asthma exacerbation   Interventions:  Collaboration with Steele Sizer, MD regarding development and update of comprehensive plan of care as evidenced by provider attestation and co-signature Inter-disciplinary care team collaboration (see longitudinal plan of care) Reviewed current plan for asthma management. She reports experiencing a recent flare-up. Reports doing well today. Denies episodes of shortness of breath within the last few days. Using recommended precautions to prevent exacerbation warmer temperatures. Reviewed importance of daily self-assessment.  Reviewed worsening symptoms  that require immediate medical attention.  Advised to continue utilizing prevention strategies to reduce risk of respiratory infection.    Self-Care Activities/Patient Goals: -Take medications and utilize inhalers as prescribed -Assess symptoms daily  -Follow recommendations to prevent respiratory infection -Notify  provider or care management team with questions and new concerns as needed   Follow Up Plan:  Will follow up in August    Patient Care Plan: Hypertension and Hyperlipidemia     Problem Identified: Hypertension and Hyperlipidemia      Long-Range Goal: Hypertensionand Hyperlipidemia Monitored   Start Date: 05/15/2021  Expected End Date: 09/12/2021  Priority: High  Note:   Objective:  Last practice recorded BP readings:  BP Readings from Last 3 Encounters:  04/24/21 (!) 152/75  04/23/21 128/76  03/20/21 114/66   Most recent eGFR/CrCl: No results found for: EGFR  No components found for: CRCL  Lab Results  Component Value Date   CHOL 218 (H) 10/15/2020   HDL 63 10/15/2020   LDLCALC 130 (H) 10/15/2020   TRIG 134 10/15/2020   CHOLHDL 3.5 10/15/2020     Current Barriers:  Chronic Disease Management support and educational needs r/t Hypertension and Dyslipidemia.  Case Manager Clinical Goal(s):  Over the next 120 days, patient will demonstrate improved adherence to prescribed treatment plan as evidenced by taking all medications as prescribed, monitoring and recording blood pressure, and adhering to a cardiac prudent/heart healthy diet.   Interventions:  Collaboration with Steele Sizer, MD regarding development and update of comprehensive plan of care as evidenced by provider attestation and co-signature Inter-disciplinary care team collaboration (see longitudinal plan of care) Reviewed medications. Reports not taking Zetia d/t being unable to tolerate. Reports taking other medications as prescribed. She is currently working the [employer] and will update regarding medication tolerance. Provided information regarding established blood pressure parameters along with indications for notifying a provider. Encouraged to monitor and record readings. Discussed nutritional intake. Encouraged continue attempting to adhere to recommended diet. Advised to read nutrition labels and  avoid highly processed foods when possible. Reviewed s/sx of heart attack, stroke and worsening symptoms that require immediate medical attention.    Patient Goals/Self-Care Activities: -Self-administer medications as prescribed -Attend all scheduled provider appointments -Monitor and record blood pressure -Adhere to recommended cardiac prudent/heart healthy diet -Follow-up with CCM Pharmacist regarding Zetia and adjust medications as advised by Pharmacist and PCP. -Notify provider or care management team with questions and new concerns as needed    Follow Up Plan:  Will follow up next month        PLAN:  A member of the care management team will follow up in August.   Heinz Eckert,RN [place]/[facility] Care Management [facility] [phone]

## 2021-06-27 ENCOUNTER — Other Ambulatory Visit: Payer: Self-pay | Admitting: Family Medicine

## 2021-06-27 DIAGNOSIS — R600 Localized edema: Secondary | ICD-10-CM

## 2021-06-27 DIAGNOSIS — I119 Hypertensive heart disease without heart failure: Secondary | ICD-10-CM

## 2021-06-27 DIAGNOSIS — I43 Cardiomyopathy in diseases classified elsewhere: Secondary | ICD-10-CM

## 2021-06-27 DIAGNOSIS — I10 Essential (primary) hypertension: Secondary | ICD-10-CM

## 2021-06-27 DIAGNOSIS — L304 Erythema intertrigo: Secondary | ICD-10-CM

## 2021-06-28 ENCOUNTER — Telehealth (INDEPENDENT_AMBULATORY_CARE_PROVIDER_SITE_OTHER): Payer: Medicare Other | Admitting: Gastroenterology

## 2021-06-28 DIAGNOSIS — Z86010 Personal history of colonic polyps: Secondary | ICD-10-CM

## 2021-06-28 NOTE — Progress Notes (Signed)
Gastroenterology Pre-Procedure Review  Request Date: 07/15/21 Requesting Physician: Dr. Vicente Males  PATIENT REVIEW QUESTIONS: The patient responded to the following health history questions as indicated:    1. Are you having any GI issues? no 2. Do you have a personal history of Polyps? yes (05/05/2018 polyps removed) 3. Do you have a family history of Colon Cancer or Polyps? no 4. Diabetes Mellitus? no 5. Joint replacements in the past 12 months?no 6. Major health problems in the past 3 months?no 7. Any artificial heart valves, MVP, or defibrillator?no    MEDICATIONS & ALLERGIES:    Patient reports the following regarding taking any anticoagulation/antiplatelet therapy:   Plavix, Coumadin, Eliquis, Xarelto, Lovenox, Pradaxa, Brilinta, or Effient? yes (Plavix 75mg ) Aspirin? no  Patient confirms/reports the following medications:  Current Outpatient Medications  Medication Sig Dispense Refill   acetaminophen (TYLENOL) 500 MG tablet Take 2 tablets (1,000 mg total) by mouth every 6 (six) hours as needed for mild pain or moderate pain. 30 tablet 0   albuterol (VENTOLIN HFA) 108 (90 Base) MCG/ACT inhaler Inhale 2 puffs into the lungs every 4 hours as needed for wheezing or shortness of breath 8.5 each 11   clopidogrel (PLAVIX) 75 MG tablet Take 1 tablet (75 mg total) by mouth daily. 90 tablet 1   diclofenac Sodium (VOLTAREN) 1 % GEL Apply 4 grams to the skin 4 times daily. Need appt. 300 g 1   escitalopram (LEXAPRO) 10 MG tablet Take 1 tablet by mouth daily 90 tablet 1   Evolocumab (REPATHA) 140 MG/ML SOSY Inject 140 mg into the skin every 14 (fourteen) days. 4.2 mL 5   ezetimibe (ZETIA) 10 MG tablet Take 1 tab by mouth daily. Need appt 30 tablet 1   fluticasone furoate-vilanterol (BREO ELLIPTA) 100-25 MCG/INH AEPB Inhale 1 puff into the lungs daily. 180 each 1   furosemide (LASIX) 40 MG tablet Take 1 tablet (40 mg total) by mouth daily. 90 tablet 0   ketoconazole (NIZORAL) 2 % cream APPLY TO  THE AFFECTED AREA(S) ONCE DAILY AS NEEDED TO abdominal fold 60 g 0   levocetirizine (XYZAL) 5 MG tablet Take 1 tablet by mouth every evening 30 tablet 11   olmesartan-hydrochlorothiazide (BENICAR HCT) 20-12.5 MG tablet Take 1 tablet by mouth daily 30 tablet 1   pantoprazole (PROTONIX) 20 MG tablet Take 1 tablet by mouth daily 30 tablet 11   potassium chloride SA (KLOR-CON) 20 MEQ tablet Take 1 tablet (20 mEq total) by mouth daily. 90 tablet 1   pregabalin (LYRICA) 100 MG capsule Take 1 capsule by mouth twice daily 60 capsule 5   No current facility-administered medications for this visit.    Patient confirms/reports the following allergies:  Allergies  Allergen Reactions   Ferumoxytol Anaphylaxis   Liraglutide Other (See Comments)    pancreatitis   Nsaids Hives, Rash and Nausea And Vomiting   Statins Other (See Comments) and Nausea And Vomiting    Joint pains   Oxycodone Nausea And Vomiting   Crestor [Rosuvastatin Calcium] Rash    No orders of the defined types were placed in this encounter.   AUTHORIZATION INFORMATION Primary Insurance: 1D#: Group #:  Secondary Insurance: 1D#: Group #:  SCHEDULE INFORMATION: Date: 07/15/21 Time: Location: [place]

## 2021-07-02 ENCOUNTER — Telehealth: Payer: Self-pay

## 2021-07-02 NOTE — Chronic Care Management (AMB) (Signed)
    Chronic Care Management Pharmacy Assistant   Name: LALITA EBEL  MRN: [id number] DOB: [birth year]  Date- Patient called to remind of appointment with Junius Argyle, CPP on 07/03/2021 @ 1400.   No answer, left message of appointment date, time and type of appointment (either telephone or in person). Left message to have all medications, supplements, blood pressure and/or blood sugar logs available during appointment and to return call if need to reschedule.  Star Rating Drug: None     Lynann Bologna, CPA/CMA Clinical Pharmacist Assistant Phone: [phone]

## 2021-07-03 ENCOUNTER — Telehealth: Payer: Self-pay

## 2021-07-03 NOTE — Progress Notes (Deleted)
Chronic Care Management Pharmacy Note  12/30/4578 Name:  MAJESTIC MOLONY MRN:  [id number] DOB:  [birth year]  Subjective: Molly [name] is an 70 y.o. year old female who is a primary patient of Steele Sizer, MD.  The CCM team was consulted for assistance with disease management and care coordination needs.    Engaged with patient by telephone for follow up visit in response to provider referral for pharmacy case management and/or care coordination services.   Consent to Services:  The patient was given information about Chronic Care Management services, agreed to services, and gave verbal consent prior to initiation of services.  Please see initial visit note for detailed documentation.   Patient Care Team: Steele Sizer, MD as PCP - General (Family Medicine) Kate Sable, MD as PCP - Cardiology (Cardiology) Rubie Maid, MD as Consulting Physician (Obstetrics and Gynecology) Clent Jacks, RN as Registered Nurse Noreene Filbert, MD as Referring Physician (Radiation Oncology) Sindy Guadeloupe, MD as Consulting Physician (Oncology) Mellody Drown, MD as Referring Physician (Obstetrics) Germaine Pomfret, [facility] (Pharmacist) Neldon Labella, RN as Case Manager Sunday Corn, Autumn Hubbard Hartshorn (Neurology)  Recent office visits: 05/07/21: Patient presented to Clemetine Marker, LPN for AWV.  1/93/79: Patient presented to Dr. Ancil Boozer for follow-up. Benzonatate, ezetimibe, fexofenadine stopped.  03/28/21: Video visit with Dr. Ancil Boozer for viral URI. Flovent stopped. Patient started on Breo 1 puff daily and bezonatate.  03/20/21: Patient presented to Dr. Ky Barban for myalgia. Clopidogrel stopped. CRP, Sed rate elevated.   Recent consult visits: 06/17/21: Patient presented to Dr. Posey Pronto (Rheumatology) for follow-up. Prednisone decreased to 10 mg daily.  05/17/21: Patient presented to Dr. Posey Pronto (Rheumatology) for PMR Consult. 2 month prednisone course started.  04/24/21: Patient presented to Dr.  Juanetta Snow (Oncology) for endometrial adenocarcinoma follow-up.  03/27/21: Patient presented to Dr. Juanetta Snow (Oncology) for endometrial adenocarcinoma follow-up.  03/20/21: Patient presented to Dr. Melrose Nakayama (Neurology) for follow-up. Patient instructed to resume clopidogrel.  12/17/20: Patient presented to Dr. Garen Lah (Cardiology) for follow-up. Clopdigrel stopped due to myalgias.   Hospital visits: None in previous 6 months  Objective:  Lab Results  Component Value Date   CREATININE 0.79 01/29/2021   BUN 14 01/29/2021   GFRNONAA >60 01/29/2021   GFRAA 81 11/07/2020   NA 139 01/29/2021   K 3.8 01/29/2021   CALCIUM 9.6 01/29/2021   CO2 31 01/29/2021   GLUCOSE 109 (H) 01/29/2021    Lab Results  Component Value Date/Time   HGBA1C 6.2 (H) 04/23/2021 09:20 AM   HGBA1C 5.9 (H) 10/15/2020 04:13 PM    Last diabetic Eye exam: No results found for: HMDIABEYEEXA  Last diabetic Foot exam: No results found for: HMDIABFOOTEX   Lab Results  Component Value Date   CHOL 218 (H) 10/15/2020   HDL 63 10/15/2020   LDLCALC 130 (H) 10/15/2020   TRIG 134 10/15/2020   CHOLHDL 3.5 10/15/2020    Hepatic Function Latest Ref Rng & Units 01/29/2021 11/07/2020 07/13/2020  Total Protein 6.5 - 8.1 g/dL 7.4 6.5 6.8  Albumin 3.5 - 5.0 g/dL 4.1 - 3.7  AST 15 - 41 U/L _0 ALT 0 - 44 U/L _1 Alk Phosphatase 38 - 126 U/L 77 - 72  Total Bilirubin 0.3 - 1.2 mg/dL 0.7 0.5 0.5    Lab Results  Component Value Date/Time   TSH 1.47 01/23/2017 08:29 AM    CBC Latest Ref Rng & Units 01/29/2021 07/13/2020 07/06/2020  WBC 4.0 - 10.5 K/uL 5.3 4.7 5.0  Hemoglobin 12.0 - 15.0 g/dL 12.0 11.2(L) 10.5(L)  Hematocrit 36.0 - 46.0 % 35.7(L) 33.7(L) 31.0(L)  Platelets 150 - 400 K/uL 275 265 226    Lab Results  Component Value Date/Time   VD25OH 38 01/23/2017 08:29 AM    Clinical ASCVD: Yes  The ASCVD Risk score Mikey Bussing DC Jr., et al., 2013) failed to calculate for the following reasons:   The patient has a  prior MI or stroke diagnosis    Depression screen [facility] 2/9 05/15/2021 05/07/2021 04/23/2021  Decreased Interest 0 0 0  Down, Depressed, Hopeless 0 0 0  PHQ - 2 Score 0 0 0  Altered sleeping - - 0  Tired, decreased energy - - 0  Change in appetite - - 0  Feeling bad or failure about yourself  - - 0  Trouble concentrating - - 0  Moving slowly or fidgety/restless - - 0  Suicidal thoughts - - 0  PHQ-9 Score - - 0  Some recent data might be hidden     Social History   Tobacco Use  Smoking Status Never  Smokeless Tobacco Never  Tobacco Comments   smoking cessation materials not required   BP Readings from Last 3 Encounters:  04/24/21 (!) 152/75  04/23/21 128/76  03/20/21 114/66   Pulse Readings from Last 3 Encounters:  04/24/21 75  04/23/21 86  03/20/21 90   Wt Readings from Last 3 Encounters:  04/24/21 236 lb 9.6 oz (107.3 kg)  04/23/21 234 lb (106.1 kg)  03/20/21 233 lb 9.6 oz (106 kg)   BMI Readings from Last 3 Encounters:  04/24/21 47.79 kg/m  04/23/21 47.26 kg/m  03/20/21 47.18 kg/m    Assessment/Interventions: Review of patient past medical history, allergies, medications, health status, including review of consultants reports, laboratory and other test data, was performed as part of comprehensive evaluation and provision of chronic care management services.   SDOH:  (Social Determinants of Health) assessments and interventions performed: Yes   SDOH Screenings   Alcohol Screen: Low Risk    Last Alcohol Screening Score (AUDIT): 0  Depression (PHQ2-9): Low Risk    PHQ-2 Score: 0  Financial Resource Strain: Low Risk    Difficulty of Paying Living Expenses: Not hard at all  Food Insecurity: No Food Insecurity   Worried About Charity fundraiser in the Last Year: Never true   Ran Out of Food in the Last Year: Never true  Housing: Low Risk    Last Housing Risk Score: 0  Physical Activity: Inactive   Days of Exercise per Week: 0 days   Minutes of Exercise per  Session: 0 min  Social Connections: Moderately Integrated   Frequency of Communication with Friends and Family: More than three times a week   Frequency of Social Gatherings with Friends and Family: Three times a week   Attends Religious Services: More than 4 times per year   Active Member of Clubs or Organizations: Yes   Attends Music therapist: More than 4 times per year   Marital Status: Divorced  Stress: No Stress Concern Present   Feeling of Stress : Not at all  Tobacco Use: Low Risk    Smoking Tobacco Use: Never   Smokeless Tobacco Use: Never  Transportation Needs: No Transportation Needs   Lack of Transportation (Medical): No   Lack of Transportation (Non-Medical): No    CCM Care Plan  Allergies  Allergen Reactions   Ferumoxytol Anaphylaxis   Liraglutide Other (See Comments)  pancreatitis   Nsaids Hives, Rash and Nausea And Vomiting   Statins Other (See Comments) and Nausea And Vomiting    Joint pains   Oxycodone Nausea And Vomiting   Crestor [Rosuvastatin Calcium] Rash    Medications Reviewed Today     Reviewed by Neldon Labella, RN (Registered Nurse) on 06/26/21 at [place] List Status: <None>   Medication Order Taking? Sig Documenting Provider Last Dose Status Informant  acetaminophen (TYLENOL) 500 MG tablet [id number] No Take 2 tablets (1,000 mg total) by mouth every 6 (six) hours as needed for mild pain or moderate pain. Rubie Maid, MD Taking Active Self  albuterol (VENTOLIN HFA) 108 (90 Base) MCG/ACT inhaler [id number]  Inhale 2 puffs into the lungs every 4 hours as needed for wheezing or shortness of breath Steele Sizer, MD  Active   clopidogrel (PLAVIX) 75 MG tablet [id number] No Take 1 tablet (75 mg total) by mouth daily. Kate Sable, MD Taking Active   diclofenac Sodium (VOLTAREN) 1 % GEL [id number]  Apply 4 grams to the skin 4 times daily. Need appt. Steele Sizer, MD  Active   escitalopram (LEXAPRO) 10 MG tablet [id number] No  Take 1 tablet by mouth daily Steele Sizer, MD Taking Active   Evolocumab (REPATHA) 140 MG/ML SOSY [id number] No Inject 140 mg into the skin every 14 (fourteen) days. Steele Sizer, MD Taking Active            Med Note Gloris Ham, [place] [employer] [facility]   Tue May 07, 2021  9:01 AM) Pt taking once a month  ezetimibe (ZETIA) 10 MG tablet [id number]  Take 1 tab by mouth daily. Need appt Steele Sizer, MD  Active            Med Note [facility] N   Wed Jun 26, 2021  9:08 AM) Reports being unable to tolerate d/t joint pain.  fluticasone furoate-vilanterol (BREO ELLIPTA) 100-25 MCG/INH AEPB [id number] No Inhale 1 puff into the lungs daily. Steele Sizer, MD Taking Active   furosemide (LASIX) 40 MG tablet [id number]  Take 1 tablet (40 mg total) by mouth daily. Steele Sizer, MD  Active   ketoconazole (NIZORAL) 2 % cream [id number]  Apply 1 application topically daily as needed for irritation. On abdominal fold prn Steele Sizer, MD  Active   levocetirizine (XYZAL) 5 MG tablet [id number] No Take 1 tablet by mouth every evening Steele Sizer, MD Taking Active   olmesartan-hydrochlorothiazide [facility] HCT) 20-12.5 MG tablet [id number]  Take 1 tablet by mouth daily Steele Sizer, MD  Active   pantoprazole (PROTONIX) 20 MG tablet [id number] No Take 1 tablet by mouth daily Steele Sizer, MD Taking Active   potassium chloride SA (KLOR-CON) 20 MEQ tablet [id number] No Take 1 tablet (20 mEq total) by mouth daily. 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 ([place]) 01/11/2019   Iron deficiency anemia  11/15/2018   Endometrial adenocarcinoma ([place]) 43/15/4008   Umbilical hernia without obstruction and without gangrene    Atherosclerosis of right coronary artery 11/10/2017   Atherosclerosis of abdominal aorta (  [name]) 11/10/2017   Prediabetes 10/02/2017   Perennial allergic rhinitis with seasonal variation 05/14/2016   Asthma, mild intermittent, well-controlled 05/14/2016   Hypertension goal BP (blood pressure) < 140/90 12/10/2015   Cardiomyopathy due to hypertension ([place]) 12/10/2015   History of CVA (cerebrovascular accident) 12/10/2015   Hyperlipidemia LDL goal <70 12/10/2015   Statin intolerance 12/10/2015    Immunization History  Administered Date(s) Administered   Fluad Quad(high Dose 65+) 10/05/2019, 10/15/2020   Influenza, High Dose Seasonal PF 11/11/2016, 10/02/2017, 09/07/2018   Pneumococcal Conjugate-13 04/06/2018   Pneumococcal Polysaccharide-23 11/11/2016   Tdap 11/11/2016    Conditions to be addressed/monitored:  Hypertension, Hyperlipidemia, Coronary Artery Disease, Asthma, Depression and Allergic Rhinitis  There are no care plans that you recently modified to display for this patient.    Medication Assistance: None required.  Patient affirms current coverage meets needs.  Patient's preferred pharmacy is:  [facility] - [place], [place] - [address] [place] [address] Phone: [phone] Fax: [phone]  [place], [place] - [address] [address] Phone: [phone] Fax: [phone]  [id number] [address], [place] [address] Phone: [phone] Fax: [phone]  Uses pill box? Yes Pt endorses 80% compliance  We discussed: Verbal consent obtained for [facility] enhanced pharmacy services (medication synchronization, adherence packaging, delivery  coordination). A medication sync plan was created to allow patient to get all medications delivered once every 30 to 90 days per patient preference. Patient understands they have freedom to choose pharmacy and clinical pharmacist will coordinate care between all prescribers and [facility].  Patient decided to: Utilize [facility] for medication synchronization, packaging and delivery  Care Plan and Follow Up Patient Decision:  Patient agrees to Care Plan and Follow-up.  Plan: Telephone follow up appointment with care management team member scheduled for:  07/03/21 at 2:00 PM  Doristine Section, [facility] [phone]  Current Barriers:  Unable to independently monitor therapeutic efficacy Unable to achieve control of Cholesterol   Pharmacist Clinical Goal(s):  Patient will achieve control of blood pressure as evidenced by Home BP less than 140/90 maintain control of cholesterol as evidenced by LDL less than 70  through collaboration with PharmD and provider.   Interventions: 1:1 collaboration with Steele Sizer, MD regarding development and update of comprehensive plan of care as evidenced by provider attestation and co-signature Inter-disciplinary care team collaboration (see longitudinal plan of care) Comprehensive medication review performed; medication list updated in electronic medical record  Hypertension (BP goal <140/90) -Controlled -Current treatment: Furosemide 40 mg daily as needed  Olmesartan-HCTZ 20-12.5 mg daily -Medications previously tried: NA  -Current home readings: NA -Denies hypotensive/hypertensive symptoms -Educated on Daily salt intake goal < 2300 mg; Exercise goal of 150 minutes per week; Importance of home blood pressure monitoring; -Counseled to monitor BP at home weekly, document, and provide log at future appointments -Recommended to continue current medication  Hyperlipidemia: (LDL goal <  70) -History of TIA  -Uncontrolled -Current treatment: Repatha 140 mg every 14 days  Zetia 10 mg daily  -Current antiplatelet treatment: Clopidogrel 75 mg daily (evening)  -Medications previously tried: Statins, Aspirin  Asthma (Goal: control symptoms and prevent exacerbations) -Not ideally controlled -Current treatment  Ventolin HFA 2 puffs every 4 hours as needed Breo 1 puff daily  -Current allergic  rhinitis treatment  Levocetirizine 5 mg nightly  -Medications previously tried: NA  -Previous URI symptoms have improved significantly. Still sneezing, coughing, but less severe  -Allergies normally worse in the fall.  -MMRC/CAT score: NA -Pulmonary function testing: NA -Exacerbations requiring treatment in last 6 months: Yes  -Patient denies consistent use of maintenance inhaler; only takes Breo PRN.  -Frequency of rescue inhaler use: 3 times daily  -Counseled on Proper inhaler technique; Benefits of consistent maintenance inhaler use -Recommended to continue current medication  Depression/Anxiety (Goal: Maintain stable mood) -Controlled -Current treatment: Escitalopram 10 mg daily (AM)  -Medications previously tried/failed: NA -PHQ9: 0 -GAD7: 0 -Connected with NA for mental health support -Educated on Benefits of medication for symptom control -Recommended to continue current medication  GERD (Goal: Prevent heartburn / reflux) -Controlled -Current treatment  Pantoprazole 20 mg daily (bedtime)  -Medications previously tried: NA  -Avoids pizza at night  -Counseled on trigger avoidance, not laying down after meals -Recommended to continue current medication  Chronic Pain (Goal: Minimize pain and maintain quality of life) -Controlled -Current treatment  Acetaminophen 500 mg 2 tablets  Voltaren 1 % gel  Pregabalin 100 mg twice daily  -Medications previously tried: NA -Counseled to limit acetaminophen use to < 3000 mg daily.   -Recommended to continue current  medication   Patient Goals/Self-Care Activities Patient will:  - focus on medication adherence by utilizing [facility] enhanced pharmacy services check blood pressure weekly, document, and provide at future appointments  Follow Up Plan: Telephone follow up appointment with care management team member scheduled for:  07/03/21 at 2:00 PM

## 2021-07-05 ENCOUNTER — Encounter: Payer: Self-pay | Admitting: Physician Assistant

## 2021-07-05 ENCOUNTER — Telehealth: Payer: Self-pay | Admitting: Cardiology

## 2021-07-05 NOTE — Patient Instructions (Signed)
Thank you for allowing the Chronic Care Management team to participate in your care.  It was a pleasure speaking with you. Please feel free to contact me with questions.  Goals Addressed: Patient Care Plan: Asthma (Adult)     Problem Identified: Symptom Exacerbation (Asthma)      Long-Range Goal: Symptom Exacerbation Prevented or Minimized   Start Date: 05/15/2021  Expected End Date: 09/12/2021  Priority: High   Current Barriers:  Chronic Disease Management support and educational needs r/t Asthma  Case Manager Clinical Goal(s): Over the next 120 days, patient will not require hospitalization for complications r/t Asthma exacerbation   Interventions:  Collaboration with Steele Sizer, MD regarding development and update of comprehensive plan of care as evidenced by provider attestation and co-signature Inter-disciplinary care team collaboration (see longitudinal plan of care) Reviewed current plan for asthma management. She reports experiencing a recent flare-up. Reports doing well today. Denies episodes of shortness of breath within the last few days. Using recommended precautions to prevent exacerbation warmer temperatures. Reviewed importance of daily self-assessment.  Reviewed worsening symptoms that require immediate medical attention.  Advised to continue utilizing prevention strategies to reduce risk of respiratory infection.    Self-Care Activities/Patient Goals: -Take medications and utilize inhalers as prescribed -Assess symptoms daily  -Follow recommendations to prevent respiratory infection -Notify provider or care management team with questions and new concerns as needed   Follow Up Plan:  Will follow up in August    Patient Care Plan: Hypertension and Hyperlipidemia     Problem Identified: Hypertension and Hyperlipidemia      Long-Range Goal: Hypertensionand Hyperlipidemia Monitored   Start Date: 05/15/2021  Expected End Date: 09/12/2021  Priority: High   Objective:  Last practice recorded BP readings:  BP Readings from Last 3 Encounters:  04/24/21 (!) 152/75  04/23/21 128/76  03/20/21 114/66   Most recent eGFR/CrCl: No results found for: EGFR  No components found for: CRCL  Lab Results  Component Value Date   CHOL 218 (H) 10/15/2020   HDL 63 10/15/2020   LDLCALC 130 (H) 10/15/2020   TRIG 134 10/15/2020   CHOLHDL 3.5 10/15/2020     Current Barriers:  Chronic Disease Management support and educational needs r/t Hypertension and Dyslipidemia.  Case Manager Clinical Goal(s):  Over the next 120 days, patient will demonstrate improved adherence to prescribed treatment plan as evidenced by taking all medications as prescribed, monitoring and recording blood pressure, and adhering to a cardiac prudent/heart healthy diet.   Interventions:  Collaboration with Steele Sizer, MD regarding development and update of comprehensive plan of care as evidenced by provider attestation and co-signature Inter-disciplinary care team collaboration (see longitudinal plan of care) Reviewed medications. Reports not taking Zetia d/t being unable to tolerate. Reports taking other medications as prescribed. She is currently working the [employer] and will update regarding medication tolerance. Provided information regarding established blood pressure parameters along with indications for notifying a provider. Encouraged to monitor and record readings. Discussed nutritional intake. Encouraged continue attempting to adhere to recommended diet. Advised to read nutrition labels and avoid highly processed foods when possible. Reviewed s/sx of heart attack, stroke and worsening symptoms that require immediate medical attention.    Patient Goals/Self-Care Activities: -Self-administer medications as prescribed -Attend all scheduled provider appointments -Monitor and record blood pressure -Adhere to recommended cardiac prudent/heart healthy diet -Follow-up  with CCM Pharmacist regarding Zetia and adjust medications as advised by Pharmacist and PCP. -Notify provider or care management team with questions and new concerns  as needed    Follow Up Plan:  Will follow up next month         Ms. Peavy verbalized understanding of the information discussed during the telephonic outreach today. Declined need for mailed/printed instructions. A member of the care management team will follow up in August.   Mariselda Badalamenti,RN [place]/THN Care Management [facility] [phone]

## 2021-07-05 NOTE — Telephone Encounter (Addendum)
   Patient Name: Molly [name]  DOB: [birth year] MRN: [id number]  Primary Cardiologist: Kate Sable, MD  Chart reviewed as part of pre-operative protocol coverage for colonoscopy on 07/15/21.   She has history of HTN, HLD with statin intolerance, CVA (1995), TIA, Plavix intolerance and endometrial carcinoma. She was last seen 12/17/2020 by Dr. Garen Lah and not taking her Plavix due to intolerance and thus marked as discontinued (though still on medication list today - see below). It was noted 10/2020 cardiac monitor was without evidence of Afib/flutter. Follow-up was recommended with neurology.  I called [name] 07/05/21 to reassess her symptoms and clarify her antiplatelet tx. She confirmed that she is not taking Plavix, as well as any other antiplatelet or anticoagulation tx. She denies any CP or SOB. 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 Estimated METS >4.0. Calculated RCRI 0.9% with corresponding estimated risk of MACE low. Therefore, based on ACC/AHA guidelines, the patient would be at acceptable risk for the planned procedure without further cardiovascular testing. The patient was advised that if she develops new symptoms prior to surgery to contact our office to arrange for a follow-up visit, and she verbalized understanding.  I will route this recommendation to the requesting party via Epic fax function and remove from pre-op pool. Please call with questions.   Arvil Chaco, PA-C 07/05/2021, 11:49 AM

## 2021-07-05 NOTE — Telephone Encounter (Signed)
   [facility] Pre-operative Risk Assessment    Patient Name: Molly [name]  DOB: [birth year] MRN: [id number]  HEARTCARE STAFF:  - IMPORTANT!!!!!! Under Visit Info/Reason for Call, type in Other and utilize the format Clearance MM/DD/YY or Clearance TBD. Do not use dashes or single digits. - Please review there is not already an duplicate clearance open for this procedure. - If request is for dental extraction, please clarify the # of teeth to be extracted. - If the patient is currently at the dentist's office, call Pre-Op Callback Staff (MA/nurse) to input urgent request.  - If the patient is not currently in the dentist office, please route to the Pre-Op pool.  Request for surgical clearance:  What type of surgery is being performed? Colonoscopy   When is this surgery scheduled? 07/15/21  What type of clearance is required (medical clearance vs. Pharmacy clearance to hold med vs. Both)? both  Are there any medications that need to be held prior to surgery and how long? Plavix instructions   Practice name and name of physician performing surgery? [facility] - Dr Vicente Males  What is the office phone number? [phone]   7.   What is the office fax number? [place]  8.   Anesthesia type (None, local, MAC, general) ? Not listed    Ace Gins 07/05/2021, 11:30 AM  _________________________________________________________________   (provider comments below)

## 2021-07-05 NOTE — Addendum Note (Signed)
Addended by: Marrianne Mood D on: 07/05/2021 01:45 PM   Modules accepted: Orders

## 2021-07-12 ENCOUNTER — Encounter: Payer: Self-pay | Admitting: Gastroenterology

## 2021-07-15 ENCOUNTER — Encounter
Admission: RE | Disposition: A | Discharge status: Home / Self Care | Payer: Self-pay | Source: Home / Self Care | Attending: Gastroenterology

## 2021-07-15 ENCOUNTER — Ambulatory Visit: Payer: Medicare Other | Admitting: Certified Registered Nurse Anesthetist

## 2021-07-15 ENCOUNTER — Other Ambulatory Visit: Payer: Self-pay

## 2021-07-15 ENCOUNTER — Encounter: Payer: Self-pay | Admitting: Gastroenterology

## 2021-07-15 ENCOUNTER — Ambulatory Visit
Admission: RE | Admit: 2021-07-15 | Discharge: 2021-07-15 | Disposition: A | Discharge status: Home / Self Care | Payer: Medicare Other | Attending: Gastroenterology | Admitting: Gastroenterology

## 2021-07-15 DIAGNOSIS — Z8616 Personal history of COVID-19: Secondary | ICD-10-CM | POA: Diagnosis not present

## 2021-07-15 DIAGNOSIS — Z7951 Long term (current) use of inhaled steroids: Secondary | ICD-10-CM | POA: Insufficient documentation

## 2021-07-15 DIAGNOSIS — K635 Polyp of colon: Secondary | ICD-10-CM

## 2021-07-15 DIAGNOSIS — Z8673 Personal history of transient ischemic attack (TIA), and cerebral infarction without residual deficits: Secondary | ICD-10-CM | POA: Diagnosis not present

## 2021-07-15 DIAGNOSIS — D123 Benign neoplasm of transverse colon: Secondary | ICD-10-CM | POA: Diagnosis not present

## 2021-07-15 DIAGNOSIS — Z86010 Personal history of colonic polyps: Secondary | ICD-10-CM | POA: Diagnosis not present

## 2021-07-15 DIAGNOSIS — Z09 Encounter for follow-up examination after completed treatment for conditions other than malignant neoplasm: Secondary | ICD-10-CM | POA: Insufficient documentation

## 2021-07-15 DIAGNOSIS — Z885 Allergy status to narcotic agent status: Secondary | ICD-10-CM | POA: Insufficient documentation

## 2021-07-15 DIAGNOSIS — Z886 Allergy status to analgesic agent status: Secondary | ICD-10-CM | POA: Insufficient documentation

## 2021-07-15 DIAGNOSIS — K219 Gastro-esophageal reflux disease without esophagitis: Secondary | ICD-10-CM | POA: Diagnosis not present

## 2021-07-15 DIAGNOSIS — Z1211 Encounter for screening for malignant neoplasm of colon: Secondary | ICD-10-CM | POA: Diagnosis not present

## 2021-07-15 DIAGNOSIS — Z888 Allergy status to other drugs, medicaments and biological substances status: Secondary | ICD-10-CM | POA: Insufficient documentation

## 2021-07-15 DIAGNOSIS — Z79899 Other long term (current) drug therapy: Secondary | ICD-10-CM | POA: Diagnosis not present

## 2021-07-15 HISTORY — PX: COLONOSCOPY WITH PROPOFOL: SHX5780

## 2021-07-15 SURGERY — COLONOSCOPY WITH PROPOFOL
Anesthesia: General

## 2021-07-15 MED ORDER — SODIUM CHLORIDE 0.9 % IV SOLN: INTRAVENOUS | Status: DC

## 2021-07-15 MED ORDER — PROPOFOL 500 MG/50ML IV EMUL
INTRAVENOUS | Status: DC | PRN
  Administered 2021-07-15: 130 ug/kg/min via INTRAVENOUS

## 2021-07-15 MED ORDER — PROPOFOL 10 MG/ML IV BOLUS
INTRAVENOUS | Status: DC | PRN
  Administered 2021-07-15: 70 mg via INTRAVENOUS

## 2021-07-15 NOTE — H&P (Signed)
[name], MD [address], [place], [place], [place], [address] [address], [place], [place], [place], [address] Phone: [phone]  Fax: [phone]  Primary Care Physician:  Steele Sizer, MD   Pre-Procedure History & Physical: HPI:  Molly [name] is a 71 y.o. female is here for an colonoscopy.   Past Medical History:  Diagnosis Date   Anxiety    Asthma    history of asthma   Cardiomyopathy ([place])    Complication of anesthesia    tore hair out and made teeth rough   COVID-19 virus infection 09/08/2019   Depression    Endometrial cancer ([place]) 08/2018   GERD (gastroesophageal reflux disease)    takes prilosec prn   History of CVA (cerebrovascular accident) 12/10/2015   Hyperlipidemia LDL goal <70 12/10/2015   Hypertension    Prediabetes 10/02/2017   A1c 6 in January 2018   Stroke [facility]) 1990    no residual effects    Past Surgical History:  Procedure Laterality Date   ABDOMINAL HYSTERECTOMY     CHOLECYSTECTOMY     COLONOSCOPY WITH PROPOFOL N/A 01/08/2017   Procedure: COLONOSCOPY WITH PROPOFOL;  Surgeon: [name], MD;  Location: [facility] ENDOSCOPY;  Service: Endoscopy;  Laterality: N/A;   COLONOSCOPY WITH PROPOFOL N/A 05/05/2018   Procedure: COLONOSCOPY WITH PROPOFOL;  Surgeon: [name], MD;  Location: [facility] ENDOSCOPY;  Service: Gastroenterology;  Laterality: N/A;   HERNIA REPAIR  13/2440   umbilical   PORTACATH PLACEMENT N/A 11/04/2018   Procedure: INSERTION PORT-A-CATH;  Surgeon: Jules Husbands, MD;  Location: [facility] ORS;  Service: General;  Laterality: N/A;   SENTINEL NODE BIOPSY N/A 10/06/2018   Procedure: SENTINEL NODE BIOPSY;  Surgeon: Mellody Drown, MD;  Location: [facility] ORS;  Service: Gynecology;  Laterality: N/A;   UMBILICAL HERNIA REPAIR N/A 11/17/2017   Procedure: HERNIA REPAIR UMBILICAL ADULT;  Surgeon: Jules Husbands, MD;  Location: [facility] ORS;  Service: General;  Laterality: N/A;    Prior to Admission medications   Medication Sig Start Date End  Date Taking? Authorizing Provider  acetaminophen (TYLENOL) 500 MG tablet Take 2 tablets (1,000 mg total) by mouth every 6 (six) hours as needed for mild pain or moderate pain. 10/06/18  Yes Rubie Maid, MD  albuterol (VENTOLIN HFA) 108 (90 Base) MCG/ACT inhaler Inhale 2 puffs into the lungs every 4 hours as needed for wheezing or shortness of breath 06/25/21  Yes Sowles, Drue Stager, MD  diclofenac Sodium (VOLTAREN) 1 % GEL Apply 4 grams to the skin 4 times daily. Need appt. 06/24/21  Yes Sowles, Drue Stager, MD  escitalopram (LEXAPRO) 10 MG tablet Take 1 tablet by mouth daily 05/23/21  Yes Sowles, Drue Stager, MD  Evolocumab (REPATHA) 140 MG/ML SOSY Inject 140 mg into the skin every 14 (fourteen) days. 04/03/21  Yes Sowles, Drue Stager, MD  fluticasone furoate-vilanterol (BREO ELLIPTA) 100-25 MCG/INH AEPB Inhale 1 puff into the lungs daily. 04/23/21  Yes Sowles, Drue Stager, MD  levocetirizine Harlow Ohms) 5 MG tablet Take 1 tablet by mouth every evening 05/01/21  Yes Sowles, Drue Stager, MD  olmesartan-hydrochlorothiazide [facility] HCT) 20-12.5 MG tablet Take 1 tablet by mouth daily 06/24/21  Yes Sowles, Drue Stager, MD  pantoprazole (PROTONIX) 20 MG tablet Take 1 tablet by mouth daily 05/01/21  Yes Sowles, Drue Stager, MD  potassium chloride SA (KLOR-CON) 20 MEQ tablet Take 1 tablet (20 mEq total) by mouth daily. 05/03/21  Yes Steele Sizer, MD  pregabalin (LYRICA) 100 MG capsule Take 1 capsule by mouth twice daily 06/25/21  Yes Sowles, Drue Stager,  MD  ezetimibe (ZETIA) 10 MG tablet Take 1 tab by mouth daily. Need appt 06/24/21   Steele Sizer, MD  furosemide (LASIX) 40 MG tablet Take 1 tablet (40 mg total) by mouth daily. 05/31/21   Steele Sizer, MD  ketoconazole (NIZORAL) 2 % cream APPLY TO THE AFFECTED AREA(S) ONCE DAILY AS NEEDED TO abdominal fold 06/27/21   Steele Sizer, MD    Allergies as of 06/28/2021 - Review Complete 05/15/2021  Allergen Reaction Noted   Ferumoxytol Anaphylaxis 11/29/2018   Liraglutide Other (See Comments)  11/09/2017   Nsaids Hives, Rash, and Nausea And Vomiting 12/10/2015   Statins Other (See Comments) and Nausea And Vomiting 12/10/2015   Oxycodone Nausea And Vomiting 10/07/2018   Crestor [rosuvastatin calcium] Rash 02/23/2017    Family History  Problem Relation Age of Onset   Stroke Mother    Hypertension Mother    Dementia Mother    Alzheimer's disease Mother    Gout Father    Asthma Father    Hypertension Father    Dementia Father    Healthy Sister    Stroke Brother    Alzheimer's disease Brother    Healthy Daughter    Hypertension Brother    Healthy Brother    Cancer Paternal 47    Healthy Sister    Healthy Sister    Hypertension Sister    Hypertension Sister    Stroke Brother    Alzheimer's disease Brother    Stroke Brother    Hypertension Brother    Stroke Brother    Hypertension Brother    Healthy Brother    Healthy Brother    Hypertension Daughter    Breast cancer Neg Hx     Social History   Socioeconomic History   Marital status: Divorced    Spouse name: Not on file   Number of children: 2   Years of education: some college   Highest education level: 12th grade  Occupational History   Occupation: Retired    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   Occupation: currently a Theme park manager  Tobacco Use   Smoking status: Never   Smokeless tobacco: Never   Tobacco comments:    smoking cessation materials not required  Vaping Use   Vaping Use: Never used  Substance and Sexual Activity   Alcohol use: No    Alcohol/week: 0.0 standard drinks   Drug use: No   Sexual activity: Not Currently  Other Topics Concern   Not on file  Social History Narrative   Pt lives alone but currently staying with her daughter   Social Determinants of Health   Financial Resource Strain: Low Risk    Difficulty of Paying Living Expenses: Not hard at all  Food Insecurity: No Food Insecurity   Worried About Charity fundraiser in the Last Year: Never true   Academic librarian in the Last Year: Never true  Transportation Needs: No Transportation Needs   Lack of Transportation (Medical): No   Lack of Transportation (Non-Medical): No  Physical Activity: Inactive   Days of Exercise per Week: 0 days   Minutes of Exercise per Session: 0 min  Stress: No Stress Concern Present   Feeling of Stress : Not at all  Social Connections: Moderately Integrated   Frequency of Communication with Friends and Family: More than three times a week   Frequency of Social Gatherings with Friends and Family: Three times a week   Attends Religious Services: More than 4  times per year   Active Member of Clubs or Organizations: Yes   Attends Music therapist: More than 4 times per year   Marital Status: Divorced  Human resources officer Violence: Not At Risk   Fear of Current or Ex-Partner: No   Emotionally Abused: No   Physically Abused: No   Sexually Abused: No    Review of Systems: See HPI, otherwise negative ROS  Physical Exam: There were no vitals taken for this visit. General:   Alert,  pleasant and cooperative in NAD Head:  Normocephalic and atraumatic. Neck:  Supple; no masses or thyromegaly. Lungs:  Clear throughout to auscultation, normal respiratory effort.    Heart:  +S1, +S2, Regular rate and rhythm, No edema. Abdomen:  Soft, nontender and nondistended. Normal bowel sounds, without guarding, and without rebound.   Neurologic:  Alert and  oriented x4;  grossly normal neurologically.  Impression/Plan: Molly [name] is here for an colonoscopy to be performed for surveillance due to prior history of colon polyps   Risks, benefits, limitations, and alternatives regarding  colonoscopy have been reviewed with the patient.  Questions have been answered.  All parties agreeable.   [name], MD  07/15/2021, 8:33 AM

## 2021-07-15 NOTE — Transfer of Care (Signed)
Immediate Anesthesia Transfer of Care Note  Patient: Molly [name]  Procedure(s) Performed: COLONOSCOPY WITH PROPOFOL  Patient Location: PACU  Anesthesia Type:General  Level of Consciousness: awake and alert   Airway & Oxygen Therapy: Patient Spontanous Breathing and Patient connected to nasal cannula oxygen  Post-op Assessment: Report given to RN and Post -op Vital signs reviewed and stable  Post vital signs: Reviewed and stable  Last Vitals:  Vitals Value Taken Time  BP    Temp    Pulse    Resp    SpO2      Last Pain:  Vitals:   07/15/21 0826  TempSrc: Temporal  PainSc: 0-No pain         Complications: No notable events documented.

## 2021-07-15 NOTE — Op Note (Signed)
[facility] Patient Name: Molly [name] Procedure Date: 07/15/2021 8:49 AM MRN: [id number] Account #: [id number] Date of Birth: [birth year] Admit Type: Outpatient Age: 70 Room: [facility] ENDO ROOM 4 Gender: Female Note Status: Finalized Procedure:             Colonoscopy Indications:           Surveillance: Personal history of adenomatous polyps                         on last colonoscopy 3 years ago, Last colonoscopy: May                         2019 Providers:             [name] MD, MD Referring MD:          Bethena Roys. Sowles, MD (Referring MD) Medicines:             Monitored Anesthesia Care Complications:         No immediate complications. Procedure:             Pre-Anesthesia Assessment:                        - Prior to the procedure, a History and Physical was                         performed, and patient medications, allergies and                         sensitivities were reviewed. The patient's tolerance                         of previous anesthesia was reviewed.                        - The risks and benefits of the procedure and the                         sedation options and risks were discussed with the                         patient. All questions were answered and informed                         consent was obtained.                        - ASA Grade Assessment: II - A patient with mild                         systemic disease.                        After obtaining informed consent, the colonoscope was                         passed under direct vision. Throughout the procedure,                         the patient's blood pressure, pulse, and oxygen  saturations were monitored continuously. The                         Colonoscope was introduced through the anus and                         advanced to the the cecum, identified by the                         appendiceal orifice. The colonoscopy was  performed                         with ease. The patient tolerated the procedure well.                         The quality of the bowel preparation was excellent. Findings:      The perianal and digital rectal examinations were normal.      A 4 mm polyp was found in the transverse colon. The polyp was sessile.       The polyp was removed with a cold snare. Resection and retrieval were       complete.      A 3 mm polyp was found in the transverse colon. The polyp was sessile.       The polyp was removed with a jumbo cold forceps. Resection and retrieval       were complete.      The exam was otherwise without abnormality on direct and retroflexion       views.      The exam was otherwise without abnormality on direct and retroflexion       views. Impression:            - One 4 mm polyp in the transverse colon, removed with                         a cold snare. Resected and retrieved.                        - One 3 mm polyp in the transverse colon, removed with                         a jumbo cold forceps. Resected and retrieved.                        - The examination was otherwise normal on direct and                         retroflexion views.                        - The examination was otherwise normal on direct and                         retroflexion views. Recommendation:        - Discharge patient to home (with escort).                        - Resume previous diet.                        -  Continue present medications.                        - Await pathology results.                        - Repeat colonoscopy for surveillance based on                         pathology results. Procedure Code(s):     --- Professional ---                        [id number], Colonoscopy, flexible; with removal of                         tumor(s), polyp(s), or other lesion(s) by snare                         technique                        45380, 73, Colonoscopy, flexible; with biopsy, single                          or multiple Diagnosis Code(s):     --- Professional ---                        Z86.010, Personal history of colonic polyps                        K63.5, Polyp of colon CPT copyright 2019 American Medical Association. All rights reserved. The codes documented in this report are preliminary and upon coder review may  be revised to meet current compliance requirements. [name], MD [name] MD, MD 07/15/2021 9:11:28 AM This report has been signed electronically. Number of Addenda: 0 Note Initiated On: 07/15/2021 8:49 AM Scope Withdrawal Time: 0 hours 10 minutes 2 seconds  Total Procedure Duration: 0 hours 14 minutes 58 seconds  Estimated Blood Loss:  Estimated blood loss: none.      [facility]

## 2021-07-15 NOTE — Anesthesia Preprocedure Evaluation (Addendum)
Anesthesia Evaluation  Patient identified by MRN, date of birth, ID band Patient awake    Reviewed: Allergy & Precautions, NPO status , Patient's Chart, lab work & pertinent test results  History of Anesthesia Complications History of anesthetic complications: "rough teeth" post -op.  Airway Mallampati: III       Dental  (+) Missing,    Pulmonary asthma , neg sleep apnea, Not current smoker,    Pulmonary exam normal        Cardiovascular hypertension, Pt. on medications (-) Past MI and (-) CHF Normal cardiovascular exam(-) dysrhythmias (-) Valvular Problems/Murmurs Rhythm:Regular Rate:Normal     Neuro/Psych Anxiety Depression CVA (memory difficulties)    GI/Hepatic Neg liver ROS, GERD  Medicated and Controlled,  Endo/Other  neg diabetes  Renal/GU negative Renal ROS     Musculoskeletal   Abdominal (+) + obese,   Peds  Hematology   Anesthesia Other Findings   Reproductive/Obstetrics                            Anesthesia Physical  Anesthesia Plan  ASA: III  Anesthesia Plan: General   Post-op Pain Management:    Induction:   PONV Risk Score and Plan: 3 and Propofol infusion and TIVA  Airway Management Planned: Nasal Cannula and Natural Airway  Additional Equipment:   Intra-op Plan:   Post-operative Plan:   Informed Consent: I have reviewed the patients History and Physical, chart, labs and discussed the procedure including the risks, benefits and alternatives for the proposed anesthesia with the patient or authorized representative who has indicated his/her understanding and acceptance.     Dental advisory given  Plan Discussed with:   Anesthesia Plan Comments:         Anesthesia Quick Evaluation

## 2021-07-16 ENCOUNTER — Encounter: Payer: Self-pay | Admitting: Gastroenterology

## 2021-07-16 LAB — SURGICAL PATHOLOGY

## 2021-07-16 NOTE — Anesthesia Postprocedure Evaluation (Signed)
Anesthesia Post Note  Patient: Molly [name]  Procedure(s) Performed: COLONOSCOPY WITH PROPOFOL  Patient location during evaluation: PACU Anesthesia Type: General Level of consciousness: awake and alert Pain management: pain level controlled Vital Signs Assessment: post-procedure vital signs reviewed and stable Respiratory status: spontaneous breathing, nonlabored ventilation, respiratory function stable and patient connected to nasal cannula oxygen Cardiovascular status: blood pressure returned to baseline and stable Postop Assessment: no apparent nausea or vomiting Anesthetic complications: no   No notable events documented.   Last Vitals:  Vitals:   07/15/21 0940 07/15/21 0941  BP: 129/76 130/78  Pulse:    Resp:    Temp:    SpO2:      Last Pain:  Vitals:   07/16/21 0733  TempSrc:   PainSc: 0-No pain                 [place] Ouch

## 2021-07-17 ENCOUNTER — Telehealth: Payer: Self-pay

## 2021-07-17 DIAGNOSIS — K219 Gastro-esophageal reflux disease without esophagitis: Secondary | ICD-10-CM

## 2021-07-17 NOTE — Progress Notes (Signed)
Per Fill report from [facility], patient did not have Escitalopram, Olmesartan-HCTZ, Pregabalin,Furosemide or Clopidogrel since 05/31/2021.  Per Fill history: Escitalopram 10 mg last filled on 07/02/2021 for 30 day supply at [facility]. Olmesartan-HCTZ 20-12.5 mg last filled on 07/02/2021 for 30 day supply at [facility]. Pregabalin 100 mg last filled on 07/02/2021 for 30 day supply at [facility]. Furosemide 40 mg  last filled on 07/02/2021 for 30 day supply at [facility]. Clopidogrel 75 mg last filled on 07/02/2021 for 30 day supply at [facility].  Reached out to patient to see if she switch pharmacy.Patient states she did not switch pharmacy, and she call [facility] again to request stop sending her medication.Patient states she inform them to send only [facility] due to the cost.Patient reports [facility] told her they was under the expression to not send her medication for just that  one month.Patient states [facility] reports they made the corrections to only send [place].Notified Clinical Pharmacist and [facility].  Patient request a acute fill for Prednisone 5 mg to be deliver on 07/17/2021.Completed acute form and sent to pharmacist for review.  Reschedule patient no show appointment to 08/07/2021 for a telephone follow up with the clinical pharmacist at 2:00 pm.  [place] Pharmacist Assistant [phone]

## 2021-07-30 ENCOUNTER — Inpatient Hospital Stay (HOSPITAL_BASED_OUTPATIENT_CLINIC_OR_DEPARTMENT_OTHER): Payer: Medicare Other | Admitting: Oncology

## 2021-07-30 ENCOUNTER — Inpatient Hospital Stay: Payer: Medicare Other | Admitting: Oncology

## 2021-07-30 ENCOUNTER — Inpatient Hospital Stay: Payer: Medicare Other

## 2021-07-30 ENCOUNTER — Inpatient Hospital Stay: Payer: Medicare Other | Attending: Oncology

## 2021-07-30 VITALS — BP 142/62 | HR 63 | Temp 97.2°F | Resp 18 | Wt 240.0 lb

## 2021-07-30 DIAGNOSIS — Z9221 Personal history of antineoplastic chemotherapy: Secondary | ICD-10-CM | POA: Diagnosis not present

## 2021-07-30 DIAGNOSIS — Z90710 Acquired absence of both cervix and uterus: Secondary | ICD-10-CM | POA: Diagnosis not present

## 2021-07-30 DIAGNOSIS — Z7952 Long term (current) use of systemic steroids: Secondary | ICD-10-CM | POA: Diagnosis not present

## 2021-07-30 DIAGNOSIS — I10 Essential (primary) hypertension: Secondary | ICD-10-CM | POA: Diagnosis not present

## 2021-07-30 DIAGNOSIS — Z8673 Personal history of transient ischemic attack (TIA), and cerebral infarction without residual deficits: Secondary | ICD-10-CM | POA: Insufficient documentation

## 2021-07-30 DIAGNOSIS — C541 Malignant neoplasm of endometrium: Secondary | ICD-10-CM

## 2021-07-30 DIAGNOSIS — D638 Anemia in other chronic diseases classified elsewhere: Secondary | ICD-10-CM

## 2021-07-30 DIAGNOSIS — Z8542 Personal history of malignant neoplasm of other parts of uterus: Secondary | ICD-10-CM | POA: Insufficient documentation

## 2021-07-30 DIAGNOSIS — Z7951 Long term (current) use of inhaled steroids: Secondary | ICD-10-CM | POA: Diagnosis not present

## 2021-07-30 DIAGNOSIS — Z9079 Acquired absence of other genital organ(s): Secondary | ICD-10-CM | POA: Insufficient documentation

## 2021-07-30 DIAGNOSIS — Z923 Personal history of irradiation: Secondary | ICD-10-CM | POA: Insufficient documentation

## 2021-07-30 DIAGNOSIS — E785 Hyperlipidemia, unspecified: Secondary | ICD-10-CM | POA: Insufficient documentation

## 2021-07-30 DIAGNOSIS — Z79899 Other long term (current) drug therapy: Secondary | ICD-10-CM | POA: Insufficient documentation

## 2021-07-30 DIAGNOSIS — Z90722 Acquired absence of ovaries, bilateral: Secondary | ICD-10-CM | POA: Insufficient documentation

## 2021-07-30 DIAGNOSIS — Z08 Encounter for follow-up examination after completed treatment for malignant neoplasm: Secondary | ICD-10-CM

## 2021-07-30 LAB — COMPREHENSIVE METABOLIC PANEL
ALT: 17 U/L (ref 0–44)
AST: 21 U/L (ref 15–41)
Albumin: 3.9 g/dL (ref 3.5–5.0)
Alkaline Phosphatase: 67 U/L (ref 38–126)
Anion gap: 11 (ref 5–15)
BUN: 17 mg/dL (ref 8–23)
CO2: 28 mmol/L (ref 22–32)
Calcium: 9 mg/dL (ref 8.9–10.3)
Chloride: 99 mmol/L (ref 98–111)
Creatinine, Ser: 0.88 mg/dL (ref 0.44–1.00)
GFR, Estimated: >60 mL/min (ref >60)
Glucose, Bld: 116 mg/dL — ABNORMAL HIGH (ref 70–99)
Potassium: 3.2 mmol/L — ABNORMAL LOW (ref 3.5–5.1)
Sodium: 138 mmol/L (ref 135–145)
Total Bilirubin: 0.4 mg/dL (ref 0.3–1.2)
Total Protein: 7.2 g/dL (ref 6.5–8.1)

## 2021-07-30 LAB — CBC WITH DIFFERENTIAL/PLATELET
Abs Immature Granulocytes: 0.02 10*3/uL (ref 0.00–0.07)
Basophils Absolute: 0 10*3/uL (ref 0.0–0.1)
Basophils Relative: 0 %
Eosinophils Absolute: 0 10*3/uL (ref 0.0–0.5)
Eosinophils Relative: 0 %
HCT: 36.2 % (ref 36.0–46.0)
Hemoglobin: 11.8 g/dL — ABNORMAL LOW (ref 12.0–15.0)
Immature Granulocytes: 0 %
Lymphocytes Relative: 35 %
Lymphs Abs: 2.7 10*3/uL (ref 0.7–4.0)
MCH: 30.6 pg (ref 26.0–34.0)
MCHC: 32.6 g/dL (ref 30.0–36.0)
MCV: 93.8 fL (ref 80.0–100.0)
Monocytes Absolute: 0.7 10*3/uL (ref 0.1–1.0)
Monocytes Relative: 9 %
Neutro Abs: 4.3 10*3/uL (ref 1.7–7.7)
Neutrophils Relative %: 56 %
Platelets: 330 10*3/uL (ref 150–400)
RBC: 3.86 MIL/uL — ABNORMAL LOW (ref 3.87–5.11)
RDW: 13.2 % (ref 11.5–15.5)
WBC: 7.7 10*3/uL (ref 4.0–10.5)
nRBC: 0 % (ref 0.0–0.2)

## 2021-07-30 NOTE — Progress Notes (Signed)
Wants to know if she can take OTC weight loss pill (go low?)

## 2021-07-30 NOTE — Progress Notes (Signed)
Hematology/Oncology Consult note [facility]  [phone] [phone]  Patient Care Team: Steele Sizer, MD as PCP - General (Family Medicine) Kate Sable, MD as PCP - Cardiology (Cardiology) Rubie Maid, MD as Consulting Physician (Obstetrics and Gynecology) Clent Jacks, RN as Registered Nurse Noreene Filbert, MD as Referring Physician (Radiation Oncology) Sindy Guadeloupe, MD as Consulting Physician (Oncology) Mellody Drown, MD as Referring Physician (Obstetrics) Germaine Pomfret, [facility] (Pharmacist) Neldon Labella, RN as Case Manager [facility] (Neurology)   Name of the patient: Molly [name]  [id number]  [birth year]   Date of visit: 07/30/21  Diagnosis- 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 Marcelline Mates for cystocele and rectocele.  She was noted to have postmenopausal bleeding and cramping recently.  Ultrasound revealed heterogeneous appearance of uterus with fibroids invading the endometrium.  She underwent an endometrial biopsy when she had a second bout of postmenopausal bleeding.  Biopsy showed endometrioid adenocarcinoma FIGO grade 1.  She was then seen by Dr. Fransisca Connors and plan was to proceed with laparoscopic hysterectomy and bilateral salpingo-oophorectomy along with pelvic lymph node sampling   Final pathology showed: Endometrioid carcinoma FIGO grade 2 with 33% myometrial invasion.  Lymphovascular invasion present.  Peritoneal/ascites fluid atypical.  2 out of 3 sentinel lymph nodes were positive for macro metastases.  pT1a pN1a.  FIGO IIIC1   Patient's case was discussed at tumor board and consensus was to proceed with 3 cycles of adjuvant carbotaxol followed by radiation followed by 3 more cycles of  chemotherapy.  Patient is already met with Dr. Donella Stade who will be giving radiation therapy to pelvic and periaortic lymph nodes and boost to her vaginal cuff.  PET CT scan showed metastatic adenopathy and retroperitoneal and external iliac group of lymph nodes in the right side.  No evidence of metastatic disease elsewhere.   First cycle of chemotherapy was given on 11/05/2018.  She completed 6 cycles on 02/25/2019.  She completed whole pelvic radiation in June 2020.    Interval history-Molly [name] is a 70 year old female who presents today for follow-up.  She was last seen by Dr. Janese Banks on 01/29/2021 and is followed by Dr. Fransisca Connors last seen on 04/24/2021.  Her most recent pelvic exam was completely normal.  She had a colonoscopy on 07/15/2021 by Dr. Vicente Males which showed 2 polyps that were removed-pathology showed tubular adenoma and was negative for high-grade dysplasia and malignancy.   Reports being on prednisone for about 2 months now due to myalgias/arthralgias secondary to cholesterol medication.  States Dr. Posey Pronto continues to taper her off.  She is currently on 15 mg/day.  Joint pain has significantly improved although now she reports swelling in lower extremities and weight gain.  She continues to complain of persistent peripheral neuropathy due to Taxol with improvement on Lyrica/gabapentin.  Denies any vaginal bleeding or discharge.  Appetite is good and denies any abdominal pain.  Reports weight gain and has questions regarding weight loss medications.  ECOG PS- 1 Pain scale- 0  Review of systems- Review of Systems  Constitutional:  Positive for malaise/fatigue.  Cardiovascular:  Positive for leg swelling.  Neurological:  Positive for sensory change.     Allergies  Allergen Reactions   Ferumoxytol Anaphylaxis   Liraglutide Other (See Comments)    pancreatitis   Nsaids  Hives, Rash and Nausea And Vomiting   Clopidogrel Other (See Comments)    Reports intolerance and states causes myalgias    Statins Other (See Comments) and Nausea And Vomiting    Joint pains   Oxycodone Nausea And Vomiting   Crestor [Rosuvastatin Calcium] Rash   Zetia [Ezetimibe] Other (See Comments)    Muscle weakness and joint pain     Past Medical History:  Diagnosis Date   Anxiety    Asthma    history of asthma   Cardiomyopathy ([place])    Complication of anesthesia    tore hair out and made teeth rough   COVID-19 virus infection 09/08/2019   Depression    Endometrial cancer ([place]) 08/2018   GERD (gastroesophageal reflux disease)    takes prilosec prn   History of CVA (cerebrovascular accident) 12/10/2015   Hyperlipidemia LDL goal <70 12/10/2015   Hypertension    Prediabetes 10/02/2017   A1c 6 in January 2018   Stroke [facility]) 1990    no residual effects     Past Surgical History:  Procedure Laterality Date   ABDOMINAL HYSTERECTOMY     CHOLECYSTECTOMY     COLONOSCOPY WITH PROPOFOL N/A 01/08/2017   Procedure: COLONOSCOPY WITH PROPOFOL;  Surgeon: [name], MD;  Location: [facility] ENDOSCOPY;  Service: Endoscopy;  Laterality: N/A;   COLONOSCOPY WITH PROPOFOL N/A 05/05/2018   Procedure: COLONOSCOPY WITH PROPOFOL;  Surgeon: [name], MD;  Location: [facility] ENDOSCOPY;  Service: Gastroenterology;  Laterality: N/A;   COLONOSCOPY WITH PROPOFOL N/A 07/15/2021   Procedure: COLONOSCOPY WITH PROPOFOL;  Surgeon: [name], MD;  Location: [facility] ENDOSCOPY;  Service: Gastroenterology;  Laterality: N/A;   HERNIA REPAIR  [id number]   umbilical   PORTACATH PLACEMENT N/A 11/04/2018   Procedure: INSERTION PORT-A-CATH;  Surgeon: Jules Husbands, MD;  Location: [facility] ORS;  Service: General;  Laterality: N/A;   SENTINEL NODE BIOPSY N/A 10/06/2018   Procedure: SENTINEL NODE BIOPSY;  Surgeon: Mellody Drown, MD;  Location: [facility] ORS;  Service: Gynecology;  Laterality: N/A;   UMBILICAL HERNIA REPAIR N/A 11/17/2017   Procedure: HERNIA REPAIR UMBILICAL ADULT;  Surgeon: Jules Husbands, MD;  Location: [facility] ORS;  Service: General;   Laterality: N/A;    Social History   Socioeconomic History   Marital status: Divorced    Spouse name: Not on file   Number of children: 2   Years of education: some college   Highest education level: 12th grade  Occupational History   Occupation: Retired    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   Occupation: currently a Theme park manager  Tobacco Use   Smoking status: Never   Smokeless tobacco: Never   Tobacco comments:    smoking cessation materials not required  Vaping Use   Vaping Use: Never used  Substance and Sexual Activity   Alcohol use: No    Alcohol/week: 0.0 standard drinks   Drug use: No   Sexual activity: Not Currently  Other Topics Concern   Not on file  Social History Narrative   Pt lives alone but currently staying with her daughter   Social Determinants of Health   Financial Resource Strain: Low Risk    Difficulty of Paying Living Expenses: Not hard at all  Food Insecurity: No Food Insecurity   Worried About Charity fundraiser in the Last Year: Never true   Arboriculturist in the Last Year: Never true  Transportation Needs: No Transportation Needs   Lack of Transportation (Medical): No  Lack of Transportation (Non-Medical): No  Physical Activity: Inactive   Days of Exercise per Week: 0 days   Minutes of Exercise per Session: 0 min  Stress: No Stress Concern Present   Feeling of Stress : Not at all  Social Connections: Moderately Integrated   Frequency of Communication with Friends and Family: More than three times a week   Frequency of Social Gatherings with Friends and Family: Three times a week   Attends Religious Services: More than 4 times per year   Active Member of Clubs or Organizations: Yes   Attends Music therapist: More than 4 times per year   Marital Status: Divorced  Human resources officer Violence: Not At Risk   Fear of Current or Ex-Partner: No   Emotionally Abused: No   Physically Abused: No   Sexually Abused: No    Family  History  Problem Relation Age of Onset   Stroke Mother    Hypertension Mother    Dementia Mother    Alzheimer's disease Mother    Gout Father    Asthma Father    Hypertension Father    Dementia Father    Healthy Sister    Stroke Brother    Alzheimer's disease Brother    Healthy Daughter    Hypertension Brother    Healthy Brother    Cancer Paternal 56    Healthy Sister    Healthy Sister    Hypertension Sister    Hypertension Sister    Stroke Brother    Alzheimer's disease Brother    Stroke Brother    Hypertension Brother    Stroke Brother    Hypertension Brother    Healthy Brother    Healthy Brother    Hypertension Daughter    Breast cancer Neg Hx      Current Outpatient Medications:    acetaminophen (TYLENOL) 500 MG tablet, Take 2 tablets (1,000 mg total) by mouth every 6 (six) hours as needed for mild pain or moderate pain., Disp: 30 tablet, Rfl: 0   albuterol (VENTOLIN HFA) 108 (90 Base) MCG/ACT inhaler, Inhale 2 puffs into the lungs every 4 hours as needed for wheezing or shortness of breath, Disp: 8.5 each, Rfl: 11   clopidogrel (PLAVIX) 75 MG tablet, Take 75 mg by mouth daily., Disp: , Rfl:    diclofenac Sodium (VOLTAREN) 1 % GEL, Apply 4 grams to the skin 4 times daily. Need appt., Disp: 300 g, Rfl: 1   escitalopram (LEXAPRO) 10 MG tablet, Take 1 tablet by mouth daily, Disp: 90 tablet, Rfl: 1   Evolocumab (REPATHA) 140 MG/ML SOSY, Inject 140 mg into the skin every 14 (fourteen) days., Disp: 4.2 mL, Rfl: 5   fluticasone furoate-vilanterol (BREO ELLIPTA) 100-25 MCG/INH AEPB, Inhale 1 puff into the lungs daily., Disp: 180 each, Rfl: 1   furosemide (LASIX) 40 MG tablet, Take 1 tablet (40 mg total) by mouth daily., Disp: 90 tablet, Rfl: 0   ketoconazole (NIZORAL) 2 % cream, APPLY TO THE AFFECTED AREA(S) ONCE DAILY AS NEEDED TO abdominal fold, Disp: 60 g, Rfl: 0   levocetirizine (XYZAL) 5 MG tablet, Take 1 tablet by mouth every evening, Disp: 30 tablet, Rfl: 11    olmesartan-hydrochlorothiazide (BENICAR HCT) 20-12.5 MG tablet, Take 1 tablet by mouth daily, Disp: 30 tablet, Rfl: 1   pantoprazole (PROTONIX) 20 MG tablet, Take 1 tablet by mouth daily, Disp: 30 tablet, Rfl: 11   potassium chloride SA (KLOR-CON) 20 MEQ tablet, Take 1 tablet (20 mEq total) by mouth  daily., Disp: 90 tablet, Rfl: 1   predniSONE (DELTASONE) 5 MG tablet, Take 5 mg by mouth daily. 3 tablets once a day, Disp: , Rfl:    pregabalin (LYRICA) 100 MG capsule, Take 1 capsule by mouth twice daily, Disp: 60 capsule, Rfl: 5   ezetimibe (ZETIA) 10 MG tablet, Take 1 tab by mouth daily. Need appt (Patient not taking: Reported on 07/30/2021), Disp: 30 tablet, Rfl: 1  Physical exam:  Physical Exam Constitutional:      Appearance: Normal appearance. She is obese.  HENT:     Head: Normocephalic and atraumatic.  Eyes:     Pupils: Pupils are equal, round, and reactive to light.  Cardiovascular:     Rate and Rhythm: Normal rate and regular rhythm.     Heart sounds: Normal heart sounds. No murmur heard. Pulmonary:     Effort: Pulmonary effort is normal.     Breath sounds: Normal breath sounds. No wheezing.  Abdominal:     General: Bowel sounds are normal. There is no distension.     Palpations: Abdomen is soft.     Tenderness: There is no abdominal tenderness.  Musculoskeletal:        General: Swelling present. Normal range of motion.     Cervical back: Normal range of motion.  Skin:    General: Skin is warm and dry.     Findings: No rash.  Neurological:     Mental Status: She is alert and oriented to person, place, and time.  Psychiatric:        Judgment: Judgment normal.     CMP Latest Ref Rng & Units 07/30/2021  Glucose 70 - 99 mg/dL 116(H)  BUN 8 - 23 mg/dL 17  Creatinine 0.44 - 1.00 mg/dL 0.88  Sodium 135 - 145 mmol/L 138  Potassium 3.5 - 5.1 mmol/L 3.2(L)  Chloride 98 - 111 mmol/L 99  CO2 22 - 32 mmol/L 28  Calcium 8.9 - 10.3 mg/dL 9.0  Total Protein 6.5 - 8.1 g/dL 7.2  Total  Bilirubin 0.3 - 1.2 mg/dL 0.4  Alkaline Phos 38 - 126 U/L 67  AST 15 - 41 U/L 21  ALT 0 - 44 U/L 17   CBC Latest Ref Rng & Units 07/30/2021  WBC 4.0 - 10.5 K/uL 7.7  Hemoglobin 12.0 - 15.0 g/dL 11.8(L)  Hematocrit 36.0 - 46.0 % 36.2  Platelets 150 - 400 K/uL 330    No images are attached to the encounter.  No results found.   Assessment and plan- Patient is a 70 y.o. female  with stage IIIc grade 2 endometrial carcinoma with bilateral positive pelvic sentinel lymph node s/p TLH/BSO with pelvic sentinel lymph node biopsies in October 2019 followed by adjuvant chemotherapy and radiation treatment.  She is here for routine follow-up of endometrial cancer.  Clinically she continues to do well with no concerning signs and symptoms of recurrence.  She has been rotating visits with gyn/onc, radiation oncology and medical oncology so that she is evaluated every 3 to 4 months.  She had a CT abdomen/pelvis last on 11/12/2020 which was negative for recurrence.  Her CA125's have been normal.  Today's results are pending.  Chemo induced peripheral neuropathy-stable on Lyrica  Weight gain/swelling-secondary to prednisone.  She is followed by Dr. Posey Pronto for myalgias/arthralgias secondary to polymyalgia rheumatica.  She has stopped her cholesterol medication because she thought this was contributing.  She is currently on 15 mg prednisone daily and they are working to taper her off.  Patient has questions about an OTC weight loss medication called Golo.    RTC in 6 months with lab work (CBC, CMP and CA125) and to see Dr. Janese Banks.  I spent 20 minutes dedicated to the care of this patient (face-to-face and non-face-to-face) on the date of the encounter to include what is described in the assessment and plan.  Visit Diagnosis 1. Endometrial adenocarcinoma ([place])    [name], NP 07/30/2021 12:01 PM

## 2021-07-31 ENCOUNTER — Ambulatory Visit (INDEPENDENT_AMBULATORY_CARE_PROVIDER_SITE_OTHER): Payer: Medicare Other

## 2021-07-31 DIAGNOSIS — I10 Essential (primary) hypertension: Secondary | ICD-10-CM

## 2021-07-31 DIAGNOSIS — J4520 Mild intermittent asthma, uncomplicated: Secondary | ICD-10-CM | POA: Diagnosis not present

## 2021-07-31 LAB — CA 125: Cancer Antigen (CA) 125: 11 U/mL (ref 0.0–38.1)

## 2021-07-31 NOTE — Chronic Care Management (AMB) (Signed)
Chronic Care Management   CCM RN Visit Note  [id number] Name: Molly [name] MRN: [id number] DOB: [id number]  Subjective: Molly [name] is a 70 y.o. year old female who is a primary care patient of Steele Sizer, MD. The care management team was consulted for assistance with disease management and care coordination needs.    Engaged with patient by telephone for follow up visit in response to provider referral for case management and/or care coordination services.   Consent to Services:  The patient was given information about Chronic Care Management services, agreed to services, and gave verbal consent prior to initiation of services.  Please see initial visit note for detailed documentation.    Assessment: Review of patient past medical history, allergies, medications, health status, including review of consultants reports, laboratory and other test data, was performed as part of comprehensive evaluation and provision of chronic care management services.   SDOH (Social Determinants of Health) assessments and interventions performed:    CCM Care Plan  Allergies  Allergen Reactions   Ferumoxytol Anaphylaxis   Liraglutide Other (See Comments)    pancreatitis   Nsaids Hives, Rash and Nausea And Vomiting   Clopidogrel Other (See Comments)    Reports intolerance and states causes myalgias   Statins Other (See Comments) and Nausea And Vomiting    Joint pains   Oxycodone Nausea And Vomiting   Crestor [Rosuvastatin Calcium] Rash   Zetia [Ezetimibe] Other (See Comments)    Muscle weakness and joint pain    Outpatient Encounter Medications as of 07/31/2021  Medication Sig Note   acetaminophen (TYLENOL) 500 MG tablet Take 2 tablets (1,000 mg total) by mouth every 6 (six) hours as needed for mild pain or moderate pain.    albuterol (VENTOLIN HFA) 108 (90 Base) MCG/ACT inhaler Inhale 2 puffs into the lungs every 4 hours as needed for wheezing or shortness of breath    clopidogrel  (PLAVIX) 75 MG tablet Take 75 mg by mouth daily.    diclofenac Sodium (VOLTAREN) 1 % GEL Apply 4 grams to the skin 4 times daily. Need appt.    escitalopram (LEXAPRO) 10 MG tablet Take 1 tablet by mouth daily    Evolocumab (REPATHA) 140 MG/ML SOSY Inject 140 mg into the skin every 14 (fourteen) days. 05/07/2021: Pt taking once a month   ezetimibe (ZETIA) 10 MG tablet Take 1 tab by mouth daily. Need appt (Patient not taking: Reported on 07/30/2021) 06/26/2021: Reports being unable to tolerate d/t joint pain.   fluticasone furoate-vilanterol (BREO ELLIPTA) 100-25 MCG/INH AEPB Inhale 1 puff into the lungs daily.    furosemide (LASIX) 40 MG tablet Take 1 tablet (40 mg total) by mouth daily. 06/26/2021: Reports taking as needed   ketoconazole (NIZORAL) 2 % cream APPLY TO THE AFFECTED AREA(S) ONCE DAILY AS NEEDED TO abdominal fold    levocetirizine (XYZAL) 5 MG tablet Take 1 tablet by mouth every evening    olmesartan-hydrochlorothiazide (BENICAR HCT) 20-12.5 MG tablet Take 1 tablet by mouth daily    pantoprazole (PROTONIX) 20 MG tablet Take 1 tablet by mouth daily    potassium chloride SA (KLOR-CON) 20 MEQ tablet Take 1 tablet (20 mEq total) by mouth daily.    predniSONE (DELTASONE) 5 MG tablet Take 5 mg by mouth daily. 3 tablets once a day    pregabalin (LYRICA) 100 MG capsule Take 1 capsule by mouth twice daily    No facility-administered encounter medications on file as of 07/31/2021.    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 ([place]) 01/11/2019   Iron deficiency anemia 11/15/2018   Endometrial adenocarcinoma ([place]) [id number]   Umbilical hernia without obstruction and without gangrene    Atherosclerosis of right coronary artery 11/10/2017    Atherosclerosis of abdominal aorta ([place]) 11/10/2017   Prediabetes 10/02/2017   Perennial allergic rhinitis with seasonal variation 05/14/2016   Asthma, mild intermittent, well-controlled 05/14/2016   Hypertension goal BP (blood pressure) < 140/90 12/10/2015   Cardiomyopathy due to hypertension ([place]) 12/10/2015   History of CVA (cerebrovascular accident) 12/10/2015   Hyperlipidemia LDL goal <70 12/10/2015   Statin intolerance 12/10/2015    Conditions to be addressed/monitored:HTN, HLD, and Asthma  Patient Care Plan: Asthma (Adult)     Problem Identified: Symptom Exacerbation (Asthma)      Long-Range Goal: Symptom Exacerbation Prevented or Minimized   Start Date: 05/15/2021  Expected End Date: 09/12/2021  Priority: High  Note:    Current Barriers:  Chronic Disease Management support and educational needs r/t Asthma  Case Manager Clinical Goal(s): Over the next 120 days, patient will not require hospitalization for complications r/t Asthma exacerbation   Interventions:  Collaboration with Steele Sizer, MD regarding development and update of comprehensive plan of care as evidenced by provider attestation and co-signature Inter-disciplinary care team collaboration (see longitudinal plan of care) Reviewed current plan for asthma management. Reports a productive cough for the past two days. Describes sputum as clear. Denies fever. Denies episodes of shortness of breath. Overall feels that asthma is currently well controlled. Reviewed importance of daily self-assessment. Advised to continue monitoring symptoms daily. Advised to continue avoiding outdoor activity during the heat of the day to prevent exacerbations. Reviewed worsening symptoms that require immediate medical attention.  Encouraged to continue utilizing strategies to reduce risk of respiratory infection.    Self-Care Activities/Patient Goals: Take medications and utilize inhalers as prescribed Assess symptoms daily   Follow recommendations to prevent respiratory infection Notify provider or care management team with questions and new concerns as needed   Follow Up Plan:  Will follow up next month    Patient Care Plan: Hypertension and Hyperlipidemia     Problem Identified: Hypertension and Hyperlipidemia      Long-Range Goal: Hypertension and Hyperlipidemia Monitored   Start Date: 05/15/2021  Expected End Date: 09/12/2021  Priority: High  Note:    Current Barriers:  Chronic Disease Management support and educational needs r/t Hypertension and Dyslipidemia.  Case Manager Clinical Goal(s):  Over the next 120 days, patient will demonstrate improved adherence to prescribed treatment plan as evidenced by taking all medications as prescribed, monitoring and recording blood pressure, and adhering to a cardiac prudent/heart healthy diet.   Interventions:  Collaboration with Steele Sizer, MD regarding development and update of comprehensive plan of care as evidenced by provider attestation and co-signature Inter-disciplinary care team collaboration (see longitudinal plan of care) Reviewed medications. Reports excellent compliance. Regimen was adjusted d/t being unable to tolerate Zetia. Currently taking Repatha and tolerating well. Advised to keep the CCM Pharmacist updated of medication needs. Reviewed BP parameters. Reports and slightly elevated reading on yesterday of 142/60. Reports most systolic readings have ranged from the 110's to 120's. Diastolic readings have ranged in the 60's. Encouraged to continue monitoring and recording readings. Continues to  do well with nutritional intake. Reviewed s/sx of heart attack, stroke and worsening symptoms that require immediate medical attention.    Patient Goals/Self-Care Activities: Self-administer medications as prescribed Attend all scheduled provider appointments Monitor and record blood pressure Adhere to recommended cardiac prudent/heart  healthy diet Notify provider or care management team with questions and new concerns as needed    Follow Up Plan:  Will follow up next month       PLAN A member of the care management team will follow up next month.   Cristy [facility] Care Management [facility] [phone]

## 2021-08-14 NOTE — Patient Instructions (Addendum)
Thank you for allowing the Chronic Care Management team to participate in your care.   Goals Addressed: Patient Care Plan: Asthma (Adult)     Problem Identified: Symptom Exacerbation (Asthma)      Long-Range Goal: Symptom Exacerbation Prevented or Minimized   Start Date: 05/15/2021  Expected End Date: 09/12/2021  Priority: High  Note:    Current Barriers:  Chronic Disease Management support and educational needs r/t Asthma  Case Manager Clinical Goal(s): Over the next 120 days, patient will not require hospitalization for complications r/t Asthma exacerbation   Interventions:  Collaboration with Steele Sizer, MD regarding development and update of comprehensive plan of care as evidenced by provider attestation and co-signature Inter-disciplinary care team collaboration (see longitudinal plan of care) Reviewed current plan for asthma management. Reports a productive cough for the past two days. Describes sputum as clear. Denies fever. Denies episodes of shortness of breath. Overall feels that asthma is currently well controlled. Reviewed importance of daily self-assessment. Advised to continue monitoring symptoms daily. Advised to continue avoiding outdoor activity during the heat of the day to prevent exacerbations. Reviewed worsening symptoms that require immediate medical attention.  Encouraged to continue utilizing strategies to reduce risk of respiratory infection.    Self-Care Activities/Patient Goals: Take medications and utilize inhalers as prescribed Assess symptoms daily  Follow recommendations to prevent respiratory infection Notify provider or care management team with questions and new concerns as needed   Follow Up Plan:  Will follow up next month    Patient Care Plan: Hypertension and Hyperlipidemia     Problem Identified: Hypertension and Hyperlipidemia      Long-Range Goal: Hypertension and Hyperlipidemia Monitored   Start Date: 05/15/2021  Expected  End Date: 09/12/2021  Priority: High  Note:    Current Barriers:  Chronic Disease Management support and educational needs r/t Hypertension and Dyslipidemia.  Case Manager Clinical Goal(s):  Over the next 120 days, patient will demonstrate improved adherence to prescribed treatment plan as evidenced by taking all medications as prescribed, monitoring and recording blood pressure, and adhering to a cardiac prudent/heart healthy diet.   Interventions:  Collaboration with Steele Sizer, MD regarding development and update of comprehensive plan of care as evidenced by provider attestation and co-signature Inter-disciplinary care team collaboration (see longitudinal plan of care) Reviewed medications. Reports excellent compliance. Regimen was adjusted d/t being unable to tolerate Zetia. Currently taking Repatha and tolerating well. Advised to keep the CCM Pharmacist updated of medication needs. Reviewed BP parameters. Reports and slightly elevated reading on yesterday of 142/60. Reports most systolic readings have ranged from the 110's to 120's. Diastolic readings have ranged in the 60's. Encouraged to continue monitoring and recording readings. Continues to do well with nutritional intake. Reviewed s/sx of heart attack, stroke and worsening symptoms that require immediate medical attention.    Patient Goals/Self-Care Activities: Self-administer medications as prescribed Attend all scheduled provider appointments Monitor and record blood pressure Adhere to recommended cardiac prudent/heart healthy diet Notify provider or care management team with questions and new concerns as needed    Follow Up Plan:  Will follow up next month        Ms. Mcneish verbalized understanding of the information discussed during the telephonic outreach. Declined need for mailed/printed instructions. A member of the care management team will follow up next month.   [facility] Care  Management [facility] [phone]

## 2021-08-19 DIAGNOSIS — M353 Polymyalgia rheumatica: Secondary | ICD-10-CM | POA: Diagnosis not present

## 2021-08-19 MED ORDER — PANTOPRAZOLE SODIUM 20 MG PO TBEC: 20.0000 mg | DELAYED_RELEASE_TABLET | Freq: Every day | ORAL | 11 refills | Status: DC

## 2021-08-19 NOTE — Addendum Note (Signed)
Addended by: Daron Offer A on: 08/19/2021 02:23 PM   Modules accepted: Orders

## 2021-08-22 NOTE — Progress Notes (Signed)
Name: LENEA CHERY   MRN: [id number]    DOB: 09/02/1951   Date:08/26/2021       Progress Note  Subjective  Chief Complaint  Follow Up  HPI  Asthma: mild persistent: she is on daily Breo prn  and states no longer having daily cough, wheezing or SOB.   Major Depression: currently in remission doing better emotionally with Lexapro. 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    Cardiomyopathy without CHF: lower extremity edema but mild and stable, she denies orthopnea or SOB    Endometrial cancer: s/p hysterectomy, finished the chemo and radiation therapy with Dr. Baruch Gouty , no longer has a port, she had a CT pelvis 10/2020 and negative for recurrence of cancer. She is up to date with visits with Dr. Janese Banks and Donella Stade. She has neuropathy from chemotherapy, she continues to have intermittent pain on her toe, sometimes her feet feels like it is very thick, also on fingers/pain when she over uses.   Morbid obesity: off victoza because of episode of pancreatitis,. She gained from 213 lbs to 236 lbs after treatment for endometrial cancer,  today weight is up to 242 lbs. We will try starting Contrave    Dyslipidemia:  She has history of TIA and CAD, we finally her started on Repatha June 2022 since unable to tolerate statin therapy and Zetia was not getting LDL below 70   HTN: bp today  is at goal, no chest pain, dizziness, or palpitation. Continue medication   Atherosclerosis aorta: on zetia, because she cannot tolerate statin therapy , she denies side effects of medication, she started Repatha June 2022 and we will re-check levels today   GERD: symptoms are well controlled, she does not recall last time she had to take medication for it.   PMR: she was seen by Dr. Ky Barban back in Feb 2022 , had negative ANA and RF, CRP and sed rate were very high for acute onset of pain on both shoulders with difficulty abduction and had to use other hand to assist abduction of right shoulder. When she saw me in  April levels were still high and pain was still present we repeated SED rate and CRP and since still positive we referred her to Dr. Posey Pronto ( Rheumatologist) she was formally diagnosed with PMR and has prednisone since , she states rom has improved, pain is under control.   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 ([place]) 01/11/2019   Iron deficiency anemia 11/15/2018   Endometrial adenocarcinoma ([place]) [id number]   Umbilical hernia without obstruction and without gangrene    Atherosclerosis of right coronary artery 11/10/2017   Atherosclerosis of abdominal aorta ([place]) 11/10/2017   Prediabetes 10/02/2017   Perennial allergic rhinitis with seasonal variation 05/14/2016   Asthma, mild intermittent, well-controlled 05/14/2016   Hypertension goal BP (blood pressure) < 140/90 12/10/2015   Cardiomyopathy due to hypertension ([place]) 12/10/2015   History of CVA (cerebrovascular accident) 12/10/2015   Hyperlipidemia LDL goal <70 12/10/2015   Statin intolerance 12/10/2015    Past Surgical History:  Procedure Laterality Date   ABDOMINAL HYSTERECTOMY     CHOLECYSTECTOMY     COLONOSCOPY WITH PROPOFOL N/A 01/08/2017   Procedure: COLONOSCOPY WITH PROPOFOL;  Surgeon: [name], MD;  [facility]  ENDOSCOPY;  Service: Endoscopy;  Laterality: N/A;   COLONOSCOPY WITH PROPOFOL N/A 05/05/2018   Procedure: COLONOSCOPY WITH PROPOFOL;  Surgeon: [name], MD;  Location: [facility] ENDOSCOPY;  Service: Gastroenterology;  Laterality: N/A;   COLONOSCOPY WITH PROPOFOL N/A 07/15/2021   Procedure: COLONOSCOPY WITH PROPOFOL;  Surgeon: [name], MD;  Location: [facility] ENDOSCOPY;  Service: Gastroenterology;  Laterality: N/A;   HERNIA REPAIR  [id number]   umbilical   PORTACATH  PLACEMENT N/A 11/04/2018   Procedure: INSERTION PORT-A-CATH;  Surgeon: Jules Husbands, MD;  Location: [facility] ORS;  Service: General;  Laterality: N/A;   SENTINEL NODE BIOPSY N/A 10/06/2018   Procedure: SENTINEL NODE BIOPSY;  Surgeon: Mellody Drown, MD;  Location: [facility] ORS;  Service: Gynecology;  Laterality: N/A;   UMBILICAL HERNIA REPAIR N/A 11/17/2017   Procedure: HERNIA REPAIR UMBILICAL ADULT;  Surgeon: Jules Husbands, MD;  Location: [facility] ORS;  Service: General;  Laterality: N/A;    Family History  Problem Relation Age of Onset   Stroke Mother    Hypertension Mother    Dementia Mother    Alzheimer's disease Mother    Gout Father    Asthma Father    Hypertension Father    Dementia Father    Healthy Sister    Stroke Brother    Alzheimer's disease Brother    Healthy Daughter    Hypertension Brother    Healthy Brother    Cancer Paternal 39    Healthy Sister    Healthy Sister    Hypertension Sister    Hypertension Sister    Stroke Brother    Alzheimer's disease Brother    Stroke Brother    Hypertension Brother    Stroke Brother    Hypertension Brother    Healthy Brother    Healthy Brother    Hypertension Daughter    Breast cancer Neg Hx     Social History   Tobacco Use   Smoking status: Never   Smokeless tobacco: Never   Tobacco comments:    smoking cessation materials not required  Substance Use Topics   Alcohol use: No    Alcohol/week: 0.0 standard drinks     Current Outpatient Medications:    acetaminophen (TYLENOL) 500 MG tablet, Take 2 tablets (1,000 mg total) by mouth every 6 (six) hours as needed for mild pain or moderate pain., Disp: 30 tablet, Rfl: 0   albuterol (VENTOLIN HFA) 108 (90 Base) MCG/ACT inhaler, Inhale 2 puffs into the lungs every 4 hours as needed for wheezing or shortness of breath, Disp: 8.5 each, Rfl: 11   clopidogrel (PLAVIX) 75 MG tablet, Take 75 mg by mouth daily., Disp: , Rfl:    diclofenac Sodium (VOLTAREN) 1 % GEL, Apply 4  grams to the skin 4 times daily. Need appt., Disp: 300 g, Rfl: 1   escitalopram (LEXAPRO) 10 MG tablet, Take 1 tablet by mouth daily, Disp: 90 tablet, Rfl: 1   Evolocumab (REPATHA) 140 MG/ML SOSY, Inject 140 mg into the skin every 14 (fourteen) days., Disp: 4.2 mL, Rfl: 5   fluticasone furoate-vilanterol (BREO ELLIPTA) 100-25 MCG/INH AEPB, Inhale 1 puff into the lungs daily., Disp: 180 each, Rfl: 1   furosemide (LASIX) 40 MG tablet, Take 1 tablet (40 mg total) by mouth daily., Disp: 90 tablet, Rfl: 0   ketoconazole (NIZORAL) 2 % cream, APPLY TO THE AFFECTED AREA(S) ONCE DAILY AS NEEDED TO abdominal fold, Disp: 60 g, Rfl: 0   levocetirizine (XYZAL) 5 MG tablet, Take 1 tablet by mouth every  evening, Disp: 30 tablet, Rfl: 11   olmesartan-hydrochlorothiazide (BENICAR HCT) 20-12.5 MG tablet, Take 1 tablet by mouth daily, Disp: 30 tablet, Rfl: 1   pantoprazole (PROTONIX) 20 MG tablet, Take 1 tablet (20 mg total) by mouth daily., Disp: 30 tablet, Rfl: 11   potassium chloride SA (KLOR-CON) 20 MEQ tablet, Take 1 tablet (20 mEq total) by mouth daily., Disp: 90 tablet, Rfl: 1   predniSONE (DELTASONE) 5 MG tablet, Take 5 mg by mouth daily. 3 tablets once a day, Disp: , Rfl:    pregabalin (LYRICA) 100 MG capsule, Take 1 capsule by mouth twice daily, Disp: 60 capsule, Rfl: 5  Allergies  Allergen Reactions   Ferumoxytol Anaphylaxis   Liraglutide Other (See Comments)    pancreatitis   Nsaids Hives, Rash and Nausea And Vomiting   Clopidogrel Other (See Comments)    Reports intolerance and states causes myalgias   Statins Other (See Comments) and Nausea And Vomiting    Joint pains   Oxycodone Nausea And Vomiting   Crestor [Rosuvastatin Calcium] Rash   Zetia [Ezetimibe] Other (See Comments)    Muscle weakness and joint pain    I personally reviewed active problem list, medication list, allergies, family history, social history, health maintenance with the patient/caregiver  today.   ROS  Constitutional: Negative for fever or weight change.  Respiratory: Negative for cough and shortness of breath.   Cardiovascular: Negative for chest pain or palpitations.  Gastrointestinal: Negative for abdominal pain, no bowel changes.  Musculoskeletal: Negative for gait problem or joint swelling.  Skin: Negative for rash.  Neurological: Negative for dizziness or headache.  No other specific complaints in a complete review of systems (except as listed in HPI above).   Objective  Vitals:   08/26/21 1108  BP: 136/70  Pulse: 94  Resp: 16  Temp: 98.1 F (36.7 C)  SpO2: 96%  Weight: 242 lb (109.8 kg)  Height: '4\' 11"'$  (1.499 m)    Body mass index is 48.88 kg/m.  Physical Exam  Constitutional: Patient appears well-developed and well-nourished. Obese  No distress.  HEENT: head atraumatic, normocephalic, pupils equal and reactive to light, neck supple Cardiovascular: Normal rate, regular rhythm and normal heart sounds.  No murmur heard. Trace  BLE edema. Pulmonary/Chest: Effort normal and breath sounds normal. No respiratory distress. Abdominal: Soft.  There is no tenderness. Psychiatric: Patient has a normal mood and affect. behavior is normal. Judgment and thought content normal.   Recent Results (from the past 2160 hour(s))  Surgical pathology     Status: None   Collection Time: 07/15/21  9:02 AM  Result Value Ref Range   SURGICAL PATHOLOGY      SURGICAL PATHOLOGY CASE: [id number] PATIENT: Aireonna Napierala Surgical Pathology Report     Specimen Submitted: A. Colon polyp x2, transverse, cold snare; cbx  Clinical History: History of colonic polyps Z86.010. Polyps      DIAGNOSIS: A. COLON POLYP X2, TRANSVERSE; COLD SNARE AND COLD BIOPSY: - TUBULAR ADENOMA, TWO FRAGMENTS. - NEGATIVE FOR HIGH GRADE DYSPLASIA AND MALIGNANCY.   GROSS DESCRIPTION: A. Labeled: Transverse colon polyp x2 cbx and cold snare Received: Formalin Collection time: 9:02 AM  on 07/15/2021 Placed into formalin time: 9:02 AM on 07/15/2021 Tissue fragment(s): 2 Size: Range from 0.4-0.5 cm Description: Tan soft tissue fragments Entirely submitted in 1 cassette.  RB 07/15/2021  Final Diagnosis performed by Betsy Pries, MD.   Electronically signed 07/16/2021 8:41:07AM The electronic signature indicates that the named Attending Pathologist has evaluated the specimen  Technical component performed at [facility], [address] [place], [place] [address] Lab: [phone] Dir: Rush Farmer, MD, MMM  Professional component performed at [facility], [facility], [place], [place], [place] [address] Lab: [phone] Dir: Dellia Nims. Rubinas, MD   CA 125     Status: None   Collection Time: 07/30/21  9:40 AM  Result Value Ref Range   Cancer Antigen (CA) 125 11.0 0.0 - 38.1 U/mL    Comment: (NOTE) Roche Diagnostics Electrochemiluminescence Immunoassay (ECLIA) Values obtained with different assay methods or kits cannot be used interchangeably.  Results cannot be interpreted as absolute evidence of the presence or absence of malignant disease. Performed At: [facility] [place], [place] [id number] Rush Farmer MD [id number]   Comprehensive metabolic panel     Status: Abnormal   Collection Time: 07/30/21  9:40 AM  Result Value Ref Range   Sodium 138 135 - 145 mmol/L   Potassium 3.2 (L) 3.5 - 5.1 mmol/L   Chloride 99 98 - 111 mmol/L   CO2 28 22 - 32 mmol/L   Glucose, Bld 116 (H) 70 - 99 mg/dL    Comment: Glucose reference range applies only to samples taken after fasting for at least 8 hours.   BUN 17 8 - 23 mg/dL   Creatinine, Ser 0.88 0.44 - 1.00 mg/dL   Calcium 9.0 8.9 - 10.3 mg/dL   Total Protein 7.2 6.5 - 8.1 g/dL   Albumin 3.9 3.5 - 5.0 g/dL   AST 21 15 - 41 U/L   ALT 17 0 - 44 U/L   Alkaline Phosphatase 67 38 - 126 U/L   Total Bilirubin 0.4 0.3 - 1.2 mg/dL   GFR, Estimated >60 >60 mL/min    Comment:  (NOTE) Calculated using the CKD-EPI Creatinine Equation (2021)    Anion gap 11 5 - 15    Comment: Performed at [facility], [address] [place], [place] [address]  CBC with Differential     Status: Abnormal   Collection Time: 07/30/21  9:40 AM  Result Value Ref Range   WBC 7.7 4.0 - 10.5 K/uL   RBC 3.86 (L) 3.87 - 5.11 MIL/uL   Hemoglobin 11.8 (L) 12.0 - 15.0 g/dL   HCT 36.2 36.0 - 46.0 %   MCV 93.8 80.0 - 100.0 fL   MCH 30.6 26.0 - 34.0 pg   MCHC 32.6 30.0 - 36.0 g/dL   RDW 13.2 11.5 - 15.5 %   Platelets 330 150 - 400 K/uL   nRBC 0.0 0.0 - 0.2 %   Neutrophils Relative % 56 %   Neutro Abs 4.3 1.7 - 7.7 K/uL   Lymphocytes Relative 35 %   Lymphs Abs 2.7 0.7 - 4.0 K/uL   Monocytes Relative 9 %   Monocytes Absolute 0.7 0.1 - 1.0 K/uL   Eosinophils Relative 0 %   Eosinophils Absolute 0.0 0.0 - 0.5 K/uL   Basophils Relative 0 %   Basophils Absolute 0.0 0.0 - 0.1 K/uL   Immature Granulocytes 0 %   Abs Immature Granulocytes 0.02 0.00 - 0.07 K/uL    Comment: Performed at [facility], [address] [place],  [address]     PHQ2/9: Depression screen [facility] 2/9 08/26/2021 05/15/2021 05/07/2021 04/23/2021 03/28/2021  Decreased Interest 0 0 0 0 0  Down, Depressed, Hopeless 0 0 0 0 0  PHQ - 2 Score 0 0 0 0 0  Altered sleeping 0 - - 0 -  Tired, decreased energy 0 - - 0 -  Change in appetite 0 - - 0 -  Feeling bad or failure about yourself  0 - - 0 -  Trouble concentrating 0 - - 0 -  Moving slowly or fidgety/restless 0 - - 0 -  Suicidal thoughts 0 - - 0 -  PHQ-9 Score 0 - - 0 -  Some recent data might be hidden    phq 9 is negative   Fall Risk: Fall Risk  08/26/2021 05/15/2021 05/07/2021 04/23/2021 03/28/2021  Falls in the past year? 0 0 0 0 0  Number falls in past yr: 0 0 0 0 0  Injury with Fall? 0 - 0 0 0  Risk for fall due to : - Medication side effect;Other (Comment) No Fall Risks - -  Risk for fall due to: Comment - Joint Pain - - -  Follow up - Falls  prevention discussed Falls prevention discussed - -     Functional Status Survey: Is the patient deaf or have difficulty hearing?: No Does the patient have difficulty seeing, even when wearing glasses/contacts?: No Does the patient have difficulty concentrating, remembering, or making decisions?: No Does the patient have difficulty walking or climbing stairs?: No Does the patient have difficulty dressing or bathing?: No Does the patient have difficulty doing errands alone such as visiting a doctor's office or shopping?: No    Assessment & Plan  1. Cardiomyopathy due to hypertension, without heart failure ([place])   2. Hypertension goal BP (blood pressure) < 140/90  Return in 6 weeks for bp check since we are adding Contrave   3. Bilateral lower extremity edema   4. Dyslipidemia  - Lipid panel  5. Asthma, mild intermittent, well-controlled   6. Morbid obesity ([place])  Discussed with the patient the risk posed by an increased BMI. Discussed importance of portion control, calorie counting and at least 150 minutes of physical activity weekly. Avoid sweet beverages and drink more water. Eat at least 6 servings of fruit and vegetables daily   Contrave started, discussed possible side effects  7. Peripheral neuropathy due to chemotherapy ([place])   8. Atherosclerosis of abdominal aorta (H[facility])   9. Statin intolerance   10. History of TIA (transient ischemic attack)   11. History of CVA (cerebrovascular accident) without residual deficits   12. Anemia of chronic disease   13. Primary osteoarthritis of both knees   14. Major depression in remission ([place])  Continue lexapro   15. Polymyalgia rheumatica syndrome ([place])  Under the care of Rheumatologist   16. Hypokalemia  - Potassium  17. Hyperglycemia  - Hemoglobin A1c   18. Need for shingles vaccine  - Zoster Vaccine Adjuvanted [facility]) injection; Inject 0.5 mLs into the muscle once for 1 dose.  Dispense: 0.5  mL; Refill: 1  19. Breast cancer screening by mammogram  - MM 3D SCREEN BREAST BILATERAL; Future  20. Needs flu shot  - Flu vaccine HIGH DOSE PF (Fluzone High dose)

## 2021-08-26 ENCOUNTER — Other Ambulatory Visit: Payer: Self-pay

## 2021-08-26 ENCOUNTER — Ambulatory Visit (INDEPENDENT_AMBULATORY_CARE_PROVIDER_SITE_OTHER): Payer: Medicare Other | Admitting: Family Medicine

## 2021-08-26 ENCOUNTER — Encounter: Payer: Self-pay | Admitting: Family Medicine

## 2021-08-26 VITALS — BP 136/70 | HR 94 | Temp 98.1°F | Resp 16 | Ht 59.0 in | Wt 242.0 lb

## 2021-08-26 DIAGNOSIS — Z1231 Encounter for screening mammogram for malignant neoplasm of breast: Secondary | ICD-10-CM

## 2021-08-26 DIAGNOSIS — R739 Hyperglycemia, unspecified: Secondary | ICD-10-CM | POA: Diagnosis not present

## 2021-08-26 DIAGNOSIS — E876 Hypokalemia: Secondary | ICD-10-CM

## 2021-08-26 DIAGNOSIS — R600 Localized edema: Secondary | ICD-10-CM | POA: Diagnosis not present

## 2021-08-26 DIAGNOSIS — J4520 Mild intermittent asthma, uncomplicated: Secondary | ICD-10-CM | POA: Diagnosis not present

## 2021-08-26 DIAGNOSIS — I43 Cardiomyopathy in diseases classified elsewhere: Secondary | ICD-10-CM

## 2021-08-26 DIAGNOSIS — D638 Anemia in other chronic diseases classified elsewhere: Secondary | ICD-10-CM

## 2021-08-26 DIAGNOSIS — Z8673 Personal history of transient ischemic attack (TIA), and cerebral infarction without residual deficits: Secondary | ICD-10-CM | POA: Diagnosis not present

## 2021-08-26 DIAGNOSIS — I700 Atherosclerosis of aorta: Secondary | ICD-10-CM

## 2021-08-26 DIAGNOSIS — Z23 Encounter for immunization: Secondary | ICD-10-CM

## 2021-08-26 DIAGNOSIS — M353 Polymyalgia rheumatica: Secondary | ICD-10-CM | POA: Insufficient documentation

## 2021-08-26 DIAGNOSIS — M170 Bilateral primary osteoarthritis of knee: Secondary | ICD-10-CM

## 2021-08-26 DIAGNOSIS — I119 Hypertensive heart disease without heart failure: Secondary | ICD-10-CM

## 2021-08-26 DIAGNOSIS — G620 Drug-induced polyneuropathy: Secondary | ICD-10-CM | POA: Diagnosis not present

## 2021-08-26 DIAGNOSIS — E785 Hyperlipidemia, unspecified: Secondary | ICD-10-CM

## 2021-08-26 DIAGNOSIS — T451X5A Adverse effect of antineoplastic and immunosuppressive drugs, initial encounter: Secondary | ICD-10-CM

## 2021-08-26 DIAGNOSIS — Z789 Other specified health status: Secondary | ICD-10-CM

## 2021-08-26 DIAGNOSIS — I10 Essential (primary) hypertension: Secondary | ICD-10-CM

## 2021-08-26 DIAGNOSIS — F325 Major depressive disorder, single episode, in full remission: Secondary | ICD-10-CM

## 2021-08-26 MED ORDER — SHINGRIX 50 MCG/0.5ML IM SUSR: 0.5000 mL | Freq: Once | INTRAMUSCULAR | 1 refills | Status: AC

## 2021-08-26 MED ORDER — CONTRAVE 8-90 MG PO TB12: 1.0000 | ORAL_TABLET | Freq: Two times a day (BID) | ORAL | 0 refills | Status: DC

## 2021-08-26 NOTE — Patient Instructions (Signed)
Contrave, take one in am for one week, go up by one pill every week to a max of 2 twice daily

## 2021-08-27 LAB — LIPID PANEL
Cholesterol: 196 mg/dL (ref <200)
HDL: 57 mg/dL (ref >=50)
LDL Cholesterol (Calc): 109 mg/dL (calc) — ABNORMAL HIGH
Non-HDL Cholesterol (Calc): 139 mg/dL (calc) — ABNORMAL HIGH (ref <130)
Total CHOL/HDL Ratio: 3.4 (calc) (ref <5.0)
Triglycerides: 189 mg/dL — ABNORMAL HIGH (ref <150)

## 2021-08-27 LAB — HEMOGLOBIN A1C
Hgb A1c MFr Bld: 6.4 % of total Hgb — ABNORMAL HIGH (ref <5.7)
Mean Plasma Glucose: 137 mg/dL
eAG (mmol/L): 7.6 mmol/L

## 2021-08-27 LAB — POTASSIUM: Potassium: 3.6 mmol/L (ref 3.5–5.3)

## 2021-08-29 ENCOUNTER — Ambulatory Visit (INDEPENDENT_AMBULATORY_CARE_PROVIDER_SITE_OTHER): Payer: Medicare Other

## 2021-08-29 DIAGNOSIS — J4520 Mild intermittent asthma, uncomplicated: Secondary | ICD-10-CM

## 2021-08-29 DIAGNOSIS — E785 Hyperlipidemia, unspecified: Secondary | ICD-10-CM

## 2021-08-29 DIAGNOSIS — I10 Essential (primary) hypertension: Secondary | ICD-10-CM

## 2021-08-29 NOTE — Chronic Care Management (AMB) (Signed)
Chronic Care Management   CCM RN Visit Note  [id number] Name: Molly [name] MRN: [id number] DOB: [id number]  Subjective: Molly [name] is a 70 y.o. year old female who is a primary care patient of Molly Sizer, MD. The care management team was consulted for assistance with disease management and care coordination needs.    Engaged with patient by telephone for follow up visit in response to provider referral for case management and care coordination services.   Consent to Services:  The patient was given information about Chronic Care Management services, agreed to services, and gave verbal consent prior to initiation of services.  Please see initial visit note for detailed documentation.    Assessment: Review of patient past medical history, allergies, medications, health status, including review of consultants reports, laboratory and other test data, was performed as part of comprehensive evaluation and provision of chronic care management services.   SDOH (Social Determinants of Health) assessments and interventions performed: No  CCM Care Plan  Allergies  Allergen Reactions   Ferumoxytol Anaphylaxis   Liraglutide Other (See Comments)    pancreatitis   Nsaids Hives, Rash and Nausea And Vomiting   Clopidogrel Other (See Comments)    Reports intolerance and states causes myalgias   Statins Other (See Comments) and Nausea And Vomiting    Joint pains   Oxycodone Nausea And Vomiting   Crestor [Rosuvastatin Calcium] Rash   Zetia [Ezetimibe] Other (See Comments)    Muscle weakness and joint pain    Outpatient Encounter Medications as of 08/29/2021  Medication Sig Note   Evolocumab (REPATHA) 140 MG/ML SOSY Inject 140 mg into the skin every 14 (fourteen) days. 05/07/2021: Pt taking once a month   fluticasone furoate-vilanterol (BREO ELLIPTA) 100-25 MCG/INH AEPB Inhale 1 puff into the lungs daily.    furosemide (LASIX) 40 MG tablet Take 1 tablet (40 mg total) by mouth daily.  06/26/2021: Reports taking as needed   acetaminophen (TYLENOL) 500 MG tablet Take 2 tablets (1,000 mg total) by mouth every 6 (six) hours as needed for mild pain or moderate pain.    albuterol (VENTOLIN HFA) 108 (90 Base) MCG/ACT inhaler Inhale 2 puffs into the lungs every 4 hours as needed for wheezing or shortness of breath    clopidogrel (PLAVIX) 75 MG tablet Take 75 mg by mouth daily.    diclofenac Sodium (VOLTAREN) 1 % GEL Apply 4 grams to the skin 4 times daily. Need appt.    escitalopram (LEXAPRO) 10 MG tablet Take 1 tablet by mouth daily    ketoconazole (NIZORAL) 2 % cream APPLY TO THE AFFECTED AREA(S) ONCE DAILY AS NEEDED TO abdominal fold    levocetirizine (XYZAL) 5 MG tablet Take 1 tablet by mouth every evening    Naltrexone-buPROPion HCl ER (CONTRAVE) 8-90 MG TB12 Take 1 tablet by mouth in the morning and at bedtime.    olmesartan-hydrochlorothiazide (BENICAR HCT) 20-12.5 MG tablet Take 1 tablet by mouth daily    pantoprazole (PROTONIX) 20 MG tablet Take 1 tablet (20 mg total) by mouth daily.    potassium chloride SA (KLOR-CON) 20 MEQ tablet Take 1 tablet (20 mEq total) by mouth daily.    predniSONE (DELTASONE) 5 MG tablet Take 5 mg by mouth daily. 3 tablets once a day 08/29/2021: Reports taking tapered dose. Reports currently taking 7.'5mg'$ .   pregabalin (LYRICA) 100 MG capsule Take 1 capsule by mouth twice daily    No facility-administered encounter medications on file as of 08/29/2021.  PLAN Brief outreach and medication review with Molly [name]. Reports being in her vehicle at the time of the call. Will return call an reschedule outreach when she is available.   [facility] Care Management [facility] [phone]

## 2021-09-06 ENCOUNTER — Telehealth: Payer: Self-pay

## 2021-09-06 DIAGNOSIS — J4521 Mild intermittent asthma with (acute) exacerbation: Secondary | ICD-10-CM

## 2021-09-06 NOTE — Progress Notes (Addendum)
Chronic Care Management Pharmacy Assistant   Name: Molly [name]  MRN: [id number] DOB: 09-21-1951  Reason for Encounter: Medication Review/Medication Coordination Call.   Recent office visits:  08/29/2021 Neldon Labella RN (CCM) 08/26/2021 Dr.Sowles MD (PCP) start Contrave 8-90 mg, take one in am for one week, go up by one pill every week to a max of 2 twice daily  07/31/2021 Neldon Labella RN (CCM) 06/26/2021 Neldon Labella RN (CCM)  Recent consult visits:  08/19/2021 Dr.Patel MD (Rheumatology) decrease prednisone to 5 MG tablet; Take 1.5 tablets (7.5 mg total) by mouth once daily for 14 days, THEN 1 tablet (5 mg total) once daily for 14 days, THEN 0.5 tablets (2.5 mg total) once daily for 14 days,Return in about 6 weeks 07/30/2021 [name] NP (Oncology) No medication changes noted,,Return in 6 months 06/28/2021 Dr.Anna MD (Gasrtienterology) No medication changes noted 06/17/2021 Dr.Patel MD (Rheumatology) patient reports stopping Zetia, Decrease Prednisone 15 mg/day for two weeks then stay on 10 mg/day,Return in about 5 weeks    Hospital visits:  Medication Reconciliation was completed by comparing discharge summary, patient's EMR and Pharmacy list, and upon discussion with patient.  Admitted to the hospital on 07/15/2021 due to Colonoscopy. Discharge date was 07/15/2021. Discharged from [facility]?Medications Started at [facility] Discharge:?? -started none  Medication Changes at Hospital Discharge: -Changed none  Medications Discontinued at Hospital Discharge: -Stopped none  Medications that remain the same after Hospital Discharge:??  -All other medications will remain the same.    Medications: Outpatient Encounter Medications as of 09/06/2021  Medication Sig Note   acetaminophen (TYLENOL) 500 MG tablet Take 2 tablets (1,000 mg total) by mouth every 6 (six) hours as needed for mild pain or moderate pain.    albuterol (VENTOLIN HFA) 108 (90  Base) MCG/ACT inhaler Inhale 2 puffs into the lungs every 4 hours as needed for wheezing or shortness of breath    clopidogrel (PLAVIX) 75 MG tablet Take 75 mg by mouth daily.    diclofenac Sodium (VOLTAREN) 1 % GEL Apply 4 grams to the skin 4 times daily. Need appt.    escitalopram (LEXAPRO) 10 MG tablet Take 1 tablet by mouth daily    Evolocumab (REPATHA) 140 MG/ML SOSY Inject 140 mg into the skin every 14 (fourteen) days. 05/07/2021: Pt taking once a month   fluticasone furoate-vilanterol (BREO ELLIPTA) 100-25 MCG/INH AEPB Inhale 1 puff into the lungs daily.    furosemide (LASIX) 40 MG tablet Take 1 tablet (40 mg total) by mouth daily. 06/26/2021: Reports taking as needed   ketoconazole (NIZORAL) 2 % cream APPLY TO THE AFFECTED AREA(S) ONCE DAILY AS NEEDED TO abdominal fold    levocetirizine (XYZAL) 5 MG tablet Take 1 tablet by mouth every evening    Naltrexone-buPROPion HCl ER (CONTRAVE) 8-90 MG TB12 Take 1 tablet by mouth in the morning and at bedtime.    olmesartan-hydrochlorothiazide (BENICAR HCT) 20-12.5 MG tablet Take 1 tablet by mouth daily    pantoprazole (PROTONIX) 20 MG tablet Take 1 tablet (20 mg total) by mouth daily.    potassium chloride SA (KLOR-CON) 20 MEQ tablet Take 1 tablet (20 mEq total) by mouth daily.    predniSONE (DELTASONE) 5 MG tablet Take 5 mg by mouth daily. 3 tablets once a day 08/29/2021: Reports taking tapered dose. Reports currently taking 7.'5mg'$ .   pregabalin (LYRICA) 100 MG capsule Take 1 capsule by mouth twice daily    No facility-administered encounter medications on file as of  09/06/2021.    Care Gaps: Shingrix Vaccine Mammogram Star Rating Drugs: Olmesartan-HCTZ 20-12.5 mg last filled on 08/19/2021 for 30 day supply at [facility]. Medication Fill Gaps: Naltrexone-buPROPion HCl ER (CONTRAVE) 8-90 MG - Not on fill history  Reviewed chart for medication changes ahead of medication coordination call.   BP Readings from Last 3 Encounters:  08/26/21  136/70  07/30/21 (!) 142/62  07/15/21 130/78    Lab Results  Component Value Date   HGBA1C 6.4 (H) 08/26/2021     Patient obtains medications through Adherence Packaging  30 Days   Last adherence delivery included:  None ID  Patient declined medication last month Patient receive medication from [place]. Clopidogrel 75 mg 1 tablet daily-  evening meals Escitalopram 10 mg one tablet daily-  Breakfast Pregabalin 100 mg one Capsule two times daily- Breakfast,Evening meals Levocetirizine 5 mg one tablet daily - evening meals Olmesartan-HCTZ 20-12.5 mg one tablet daily  Furosemide 40 mg daily PRN Ketoconazole 2% cream PRN  Breo 100-25 MCG/ Inhaler - 1 puff daily Coenzyme Q10 one capsule Daily (OTC) Pantoprazole  20 mg- PRN Potassium Chloride 20 MEQ 1 tablet daily- evening meals Repatha 420 mg every 30 days (patient states she was approved and she received Repatha from insurance company) Ventolin HFA -PRN  Patient is due for next adherence delivery on: 09/17/2021. Called patient and reviewed medications and coordinated delivery.  This delivery to include: Clopidogrel 75 mg 1 tablet daily-  evening meals Escitalopram 10 mg one tablet daily-  Breakfast Pregabalin 100 mg one Capsule two times daily- Breakfast,Evening meals Levocetirizine 5 mg one tablet daily - evening meals Olmesartan-HCTZ 20-12.5 mg one tablet daily -Breakfast Furosemide 40 mg one tablet daily - Breakfast Ketoconazole 2% cream PRN  Pantoprazole  20 mg one tablet daily- Breakfast Potassium Chloride 20 MEQ 1 tablet daily- evening meals (Patient wants one tablet twice daily, Breakfast and Evening meals, notified  clinical pharmacist to advise) Ventolin HFA 108 MCG/ACT -PRN Contrave 8-90 mg 1 tablet 2 times daily - breakfast, Bedtime (take one in am for one week, go up by one pill every week to a max of 2 twice daily)  Patient declined the following medications (meds) due to (reason) Breo 100-25 MCG/  Inhaler - 1 puff daily (Adequate supply,patient states she only takes PRN) Coenzyme Q10 one capsule Daily (OTC)  Patient needs refills for Ventolin HFA (prescribe by PCP).  Confirmed delivery date of 09/17/2021, advised patient that pharmacy will contact them the morning of delivery.  [place] Pharmacist Assistant [phone]   Addendum 09/11/21: Consulted with PCP regarding potassium dose. Albuterol refill sent in. Will discuss Breo non-adherence at next CPP visit.  Malva Limes, [facility] [phone]

## 2021-09-08 ENCOUNTER — Telehealth: Payer: Self-pay | Admitting: Family Medicine

## 2021-09-08 DIAGNOSIS — Z8673 Personal history of transient ischemic attack (TIA), and cerebral infarction without residual deficits: Secondary | ICD-10-CM

## 2021-09-08 NOTE — Telephone Encounter (Signed)
dc'd 04/03/21 reordered Dr Ancil Boozer

## 2021-09-10 ENCOUNTER — Other Ambulatory Visit: Payer: Self-pay | Admitting: Family Medicine

## 2021-09-10 DIAGNOSIS — M170 Bilateral primary osteoarthritis of knee: Secondary | ICD-10-CM

## 2021-09-11 ENCOUNTER — Other Ambulatory Visit: Payer: Self-pay | Admitting: Family Medicine

## 2021-09-11 DIAGNOSIS — R600 Localized edema: Secondary | ICD-10-CM

## 2021-09-11 MED ORDER — POTASSIUM CHLORIDE CRYS ER 20 MEQ PO TBCR: 20.0000 meq | EXTENDED_RELEASE_TABLET | Freq: Two times a day (BID) | ORAL | 1 refills | Status: DC

## 2021-09-11 MED ORDER — ALBUTEROL SULFATE HFA 108 (90 BASE) MCG/ACT IN AERS: 2.0000 | INHALATION_SPRAY | RESPIRATORY_TRACT | 11 refills | Status: DC | PRN

## 2021-09-11 NOTE — Addendum Note (Signed)
Addended by: Daron Offer A on: 09/11/2021 09:49 AM   Modules accepted: Orders

## 2021-09-12 NOTE — Progress Notes (Signed)
Reach out to patient PCP office to inform them per [facility] is requiring a prior authorization.  Per PCP office, they will send the request over to the prior authorization team.Notified clinical pharmacist and [facility].  [place] Pharmacist Assistant [phone]

## 2021-09-12 NOTE — Telephone Encounter (Unsigned)
Copied from [place] [phone]. Topic: General - Other >> Sep 12, 2021 11:06 AM Yvette Rack wrote: Reason for CRM: [facility] stated prior authorization is needed for Naltrexone-buPROPion HCl ER (CONTRAVE) 8-90 MG TB12.

## 2021-09-12 NOTE — Telephone Encounter (Signed)
PA approved for Repatha 

## 2021-09-16 ENCOUNTER — Telehealth: Payer: Self-pay

## 2021-09-16 NOTE — Progress Notes (Signed)
Per [facility] is still filling the patient prescriptions. [facility] called [facility] and they said what they have filled is already past the point of returning them, so she will have to get the following prescriptions from [facility]; Escitalopram, Olmesartan-HCTZ, Levocetirizine, Clopidogrel, and Furosemide.  [facility] said these will be shipping out to her soon.Since she will now only be getting two medications with [place] can we send the two medications out in vials? [facility] will request a profile transfer if the patient is okay with it.  Patient states she is unsure why they keep sending her medication when she requested to be transfer to [facility].Patient states the only thing she should get from [place] is Repatha due to cost.Patient is asking if she will be receiving Contrave from [facility].  Per [facility], they reach out to [facility]  to request a profile transfer, and [facility] inform them they already have a request open to transfer her profile, they are just waiting on the patient to call in.  I inform the patient to call [phone] to request [facility] to stop sending her medication and to transfer it to [facility].Patient states she will call them again and request the change.   [place] Pharmacist Assistant [phone]

## 2021-09-27 DIAGNOSIS — E785 Hyperlipidemia, unspecified: Secondary | ICD-10-CM

## 2021-09-27 DIAGNOSIS — I10 Essential (primary) hypertension: Secondary | ICD-10-CM | POA: Diagnosis not present

## 2021-09-27 DIAGNOSIS — J4520 Mild intermittent asthma, uncomplicated: Secondary | ICD-10-CM | POA: Diagnosis not present

## 2021-09-30 DIAGNOSIS — M1711 Unilateral primary osteoarthritis, right knee: Secondary | ICD-10-CM | POA: Diagnosis not present

## 2021-09-30 DIAGNOSIS — M25561 Pain in right knee: Secondary | ICD-10-CM | POA: Diagnosis not present

## 2021-09-30 DIAGNOSIS — M25562 Pain in left knee: Secondary | ICD-10-CM | POA: Diagnosis not present

## 2021-09-30 DIAGNOSIS — R7982 Elevated C-reactive protein (CRP): Secondary | ICD-10-CM | POA: Diagnosis not present

## 2021-09-30 DIAGNOSIS — M353 Polymyalgia rheumatica: Secondary | ICD-10-CM | POA: Diagnosis not present

## 2021-09-30 DIAGNOSIS — G8929 Other chronic pain: Secondary | ICD-10-CM | POA: Diagnosis not present

## 2021-09-30 DIAGNOSIS — M1712 Unilateral primary osteoarthritis, left knee: Secondary | ICD-10-CM | POA: Diagnosis not present

## 2021-10-04 ENCOUNTER — Ambulatory Visit (INDEPENDENT_AMBULATORY_CARE_PROVIDER_SITE_OTHER): Payer: Medicare Other

## 2021-10-04 ENCOUNTER — Telehealth: Payer: Self-pay

## 2021-10-04 DIAGNOSIS — J4520 Mild intermittent asthma, uncomplicated: Secondary | ICD-10-CM

## 2021-10-04 DIAGNOSIS — I10 Essential (primary) hypertension: Secondary | ICD-10-CM

## 2021-10-04 DIAGNOSIS — E785 Hyperlipidemia, unspecified: Secondary | ICD-10-CM

## 2021-10-04 NOTE — Progress Notes (Signed)
Chronic Care Management Pharmacy Assistant   Name: TYLAH MANCILLAS  MRN: [id number] DOB: [birth year]   Reason for Encounter: Medication Review/Medication Coordination Call.   Recent office visits:  No recent Office Visit  Recent consult visits:  No recent [facility] visits:  None in previous 6 months  Medications: Outpatient Encounter Medications as of 10/04/2021  Medication Sig Note   acetaminophen (TYLENOL) 500 MG tablet Take 2 tablets (1,000 mg total) by mouth every 6 (six) hours as needed for mild pain or moderate pain.    albuterol (VENTOLIN HFA) 108 (90 Base) MCG/ACT inhaler Inhale 2 puffs into the lungs every 4 (four) hours as needed for wheezing or shortness of breath.    clopidogrel (PLAVIX) 75 MG tablet Take 75 mg by mouth daily.    diclofenac Sodium (VOLTAREN) 1 % GEL Apply 4 grams to the skin 4 times daily. Need appt.    escitalopram (LEXAPRO) 10 MG tablet Take 1 tablet by mouth daily    Evolocumab (REPATHA) 140 MG/ML SOSY Inject 140 mg into the skin every 14 (fourteen) days. 05/07/2021: Pt taking once a month   fluticasone furoate-vilanterol (BREO ELLIPTA) 100-25 MCG/INH AEPB Inhale 1 puff into the lungs daily.    furosemide (LASIX) 40 MG tablet Take 1 tablet (40 mg total) by mouth daily. 06/26/2021: Reports taking as needed   ketoconazole (NIZORAL) 2 % cream APPLY TO THE AFFECTED AREA(S) ONCE DAILY AS NEEDED TO abdominal fold    levocetirizine (XYZAL) 5 MG tablet Take 1 tablet by mouth every evening    Naltrexone-buPROPion HCl ER (CONTRAVE) 8-90 MG TB12 Take 1 tablet by mouth in the morning and at bedtime.    olmesartan-hydrochlorothiazide (BENICAR HCT) 20-12.5 MG tablet Take 1 tablet by mouth daily    pantoprazole (PROTONIX) 20 MG tablet Take 1 tablet (20 mg total) by mouth daily.    potassium chloride SA (KLOR-CON) 20 MEQ tablet Take 1 tablet (20 mEq total) by mouth 2 (two) times daily.    predniSONE (DELTASONE) 5 MG tablet Take 5 mg by mouth daily. 3  tablets once a day 08/29/2021: Reports taking tapered dose. Reports currently taking 7.5mg .   pregabalin (LYRICA) 100 MG capsule Take 1 capsule by mouth twice daily    No facility-administered encounter medications on file as of 10/04/2021.   Care Gaps: COVID Vaccine Shingrix Vaccine Mammogram Star Rating Drugs: Olmesartan-HCTZ 20-12.5 mg last filled on 09/11/2021 for 30 day supply at [facility]. Medication Fill Gaps: Naltrexone-buPROPion HCl ER (CONTRAVE) 8-90 MG - Not on fill history  Reviewed chart for medication changes ahead of medication coordination call.    BP Readings from Last 3 Encounters:  08/26/21 136/70  07/30/21 (!) 142/62  07/15/21 130/78    Lab Results  Component Value Date   HGBA1C 6.4 (H) 08/26/2021     Patient obtains medications through Adherence Packaging  30 Days   Last adherence delivery included:  Pantoprazole  20 mg one tablet daily- Breakfast Potassium Chloride 20 MEQ 1 tablet daily- evening meals (Patient wants one tablet twice daily, Breakfast and Evening meals, notified  clinical pharmacist to advise) Ventolin HFA 108 MCG/ACT -PRN Ketoconazole 2% cream PRN   Patient declined medication last month  Breo 100-25 MCG/ Inhaler - 1 puff daily (Adequate supply,patient states she only takes PRN) Coenzyme Q10 one capsule Daily (OTC) Clopidogrel 75 mg 1 tablet daily-  evening meals (receive 30 day supply from [facility] on 09/11/2021) Escitalopram 10 mg one tablet daily-  Breakfast - (  receive 30 day supply from [place] on 09/11/2021) Pregabalin 100 mg one Capsule two times daily- Breakfast,Evening meals - (receive 30 day supply from [place] on 09/11/2021) Levocetirizine 5 mg one tablet daily - evening meals - (receive 30 day supply from [place] on 09/11/2021) Olmesartan-HCTZ 20-12.5 mg one tablet daily -Breakfast - (receive 30 day supply from [facility] on 09/11/2021) Furosemide 40 mg one tablet daily  - Breakfast - (receive 30 day supply from [place] on 09/11/2021) Contrave 8-90 mg 1 tablet 2 times daily - breakfast, Bedtime (take one in am for one week, go up by one pill every week to a max of 2 twice daily)  Patient is due for next adherence delivery on: 10/16/2021. Called patient and reviewed medications and coordinated delivery.  This delivery to include: Clopidogrel 75 mg 1 tablet daily-  evening meals Escitalopram 10 mg one tablet daily-  Breakfast Pregabalin 100 mg one Capsule two times daily- Breakfast,Evening meals Levocetirizine 5 mg one tablet daily - evening meals Olmesartan-HCTZ 20-12.5 mg one tablet daily -Breakfast Furosemide 40 mg daily - Breakfast Ketoconazole 2% cream PRN  Potassium Chloride 20 MEQ 1 tablet  two times daily-Breakfast, evening meals Pantoprazole  20 mg one tablet daily- Breakfast   Patient declined the following medications: Ventolin HFA 108 MCG/ACT -PRN Breo 100-25 MCG/ Inhaler - 1 puff daily (Adequate supply,patient states she only takes PRN) Coenzyme Q10 one capsule Daily (OTC) Contrave 8-90 mg 1 tablet 2 times daily - breakfast, Bedtime (take one in am for one week, go up by one pill every week to a max of 2 twice daily Prior Authorization is needed, but patient was denied per clinical pharmacist, they were going to prescribe something different). Prednisone 5 mg daily (Patient not taking) Repatha 140 mg Inject 140 mg into the skin every 14 (fourteen) days.(Patient receive from [facility] due to cost , last filled 09/03/2021 for 30 day supply)  Patient needs refills for Ketoconazole 2% cream (PCP is the prescriber) .  Confirmed delivery date of 10/16/2021, advised patient that pharmacy will contact them the morning of delivery.  Patient states she did reach out to [facility] to inform them to cancel all of her medications and to only fill  Repatha due to cost.Patient states [facility] inform her the will transfer all  her other prescriptions once [facility] contact them, and patient should only receive Repatha.  Per [facility], Just spoke with Bre at [facility] and she still stated the patient would have to call in and cancel her account.  She said the last date that they spoke to her was 09/16/2021 and she told them not to transfer.Notified  Clinical Pharmacist.   Patient states she had appointment with her Rheumatologist and she finally finish with Prednisone, but is now experiencing body aches.Patient states she will follow up with Rheumatologist soon.   Patient states she would like her delivery to be deliver to [place], [place] [address]. [facility]ified [facility].    [place] Pharmacist Assistant [phone]

## 2021-10-04 NOTE — Chronic Care Management (AMB) (Signed)
Chronic Care Management   CCM RN Visit Note  25/02/6643 Name: Molly [name] MRN: [id number] DOB: [birth year]  Subjective: Molly [name] is a 70 y.o. year old female who is a primary care patient of Steele Sizer, MD. The care management team was consulted for assistance with disease management and care coordination needs.    Engaged with patient by telephone for follow up visit in response to provider referral for case management and care coordination services.   Consent to Services:  The patient was given information about Chronic Care Management services, agreed to services, and gave verbal consent prior to initiation of services.  Please see initial visit note for detailed documentation.    Assessment: Review of patient past medical history, allergies, medications, health status, including review of consultants reports, laboratory and other test data, was performed as part of comprehensive evaluation and provision of chronic care management services.   SDOH (Social Determinants of Health) assessments and interventions performed:  No  CCM Care Plan  Allergies  Allergen Reactions   Ferumoxytol Anaphylaxis   Liraglutide Other (See Comments)    pancreatitis   Nsaids Hives, Rash and Nausea And Vomiting   Clopidogrel Other (See Comments)    Reports intolerance and states causes myalgias   Statins Other (See Comments) and Nausea And Vomiting    Joint pains   Oxycodone Nausea And Vomiting   Crestor [Rosuvastatin Calcium] Rash   Zetia [Ezetimibe] Other (See Comments)    Muscle weakness and joint pain    Outpatient Encounter Medications as of 10/04/2021  Medication Sig Note   acetaminophen (TYLENOL) 500 MG tablet Take 2 tablets (1,000 mg total) by mouth every 6 (six) hours as needed for mild pain or moderate pain.    albuterol (VENTOLIN HFA) 108 (90 Base) MCG/ACT inhaler Inhale 2 puffs into the lungs every 4 (four) hours as needed for wheezing or shortness of breath.     clopidogrel (PLAVIX) 75 MG tablet Take 75 mg by mouth daily.    diclofenac Sodium (VOLTAREN) 1 % GEL Apply 4 grams to the skin 4 times daily. Need appt.    escitalopram (LEXAPRO) 10 MG tablet Take 1 tablet by mouth daily    Evolocumab (REPATHA) 140 MG/ML SOSY Inject 140 mg into the skin every 14 (fourteen) days. 05/07/2021: Pt taking once a month   fluticasone furoate-vilanterol (BREO ELLIPTA) 100-25 MCG/INH AEPB Inhale 1 puff into the lungs daily.    furosemide (LASIX) 40 MG tablet Take 1 tablet (40 mg total) by mouth daily. 06/26/2021: Reports taking as needed   ketoconazole (NIZORAL) 2 % cream APPLY TO THE AFFECTED AREA(S) ONCE DAILY AS NEEDED TO abdominal fold    levocetirizine (XYZAL) 5 MG tablet Take 1 tablet by mouth every evening    Naltrexone-buPROPion HCl ER (CONTRAVE) 8-90 MG TB12 Take 1 tablet by mouth in the morning and at bedtime.    olmesartan-hydrochlorothiazide (BENICAR HCT) 20-12.5 MG tablet Take 1 tablet by mouth daily    pantoprazole (PROTONIX) 20 MG tablet Take 1 tablet (20 mg total) by mouth daily.    potassium chloride SA (KLOR-CON) 20 MEQ tablet Take 1 tablet (20 mEq total) by mouth 2 (two) times daily.    predniSONE (DELTASONE) 5 MG tablet Take 5 mg by mouth daily. 3 tablets once a day 08/29/2021: Reports taking tapered dose. Reports currently taking 7.5mg .   pregabalin (LYRICA) 100 MG capsule Take 1 capsule by mouth twice daily    No facility-administered encounter medications on file as of 10/04/2021.  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[place]) 01/11/2019   Iron deficiency anemia 11/15/2018   Endometrial adenocarcinoma ([place]) 53/66/4403   Umbilical hernia  without obstruction and without gangrene    Atherosclerosis of right coronary artery 11/10/2017   Atherosclerosis of abdominal aorta ([place]) 11/10/2017   Prediabetes 10/02/2017   Perennial allergic rhinitis with seasonal variation 05/14/2016   Asthma, mild intermittent, well-controlled 05/14/2016   Hypertension goal BP (blood pressure) < 140/90 12/10/2015   Cardiomyopathy due to hypertension ([place]) 12/10/2015   History of CVA (cerebrovascular accident) 12/10/2015   Hyperlipidemia LDL goal <70 12/10/2015   Statin intolerance 12/10/2015    Conditions to be addressed/monitored:HTN, HLD and Asthma  Patient Care Plan: Asthma (Adult)     Problem Identified: Symptom Exacerbation (Asthma)      Long-Range Goal: Symptom Exacerbation Prevented or Minimized Completed 10/04/2021  Start Date: 05/15/2021  Expected End Date: 09/12/2021  Priority: High  Note:    Current Barriers:  Chronic Disease Management support and educational needs r/t Asthma  Case Manager Clinical Goal(s): Over the next 120 days, patient will not require hospitalization for complications r/t Asthma exacerbation   Interventions:  Collaboration with Steele Sizer, MD regarding development and update of comprehensive plan of care as evidenced by provider attestation and co-signature Inter-disciplinary care team collaboration (see longitudinal plan of care) Reviewed current plan for asthma management. Reports compliance with medications. States symptoms have been well controlled with her Breo inhaler. Advised to continue taking medications as prescribed and notify provider if symptoms are not adequately relieved with regimen. Discussed importance of assessing symptoms daily d/t seasonal change and her tendency to experience symptoms during cooler weather.  Reviewed worsening symptoms that require immediate medical attention.  Encouraged to continue utilizing strategies to reduce risk of respiratory infection.    Self-Care  Activities/Patient Goals: Take medications and utilize inhalers as prescribed Assess symptoms daily  Follow recommendations to prevent respiratory infection Notify provider or care management team with questions and new concerns as needed      Patient Care Plan: Hypertension and Hyperlipidemia     Problem Identified: Hypertension and Hyperlipidemia      Long-Range Goal: Hypertensionand Hyperlipidemia Monitored Completed 10/04/2021  Start Date: 05/15/2021  Expected End Date: 09/12/2021  Priority: High  Note:    Current Barriers:  Chronic Disease Management support and educational needs r/t Hypertension and Dyslipidemia.  Case Manager Clinical Goal(s):  Over the next 120 days, patient will demonstrate improved adherence to prescribed treatment plan as evidenced by taking all medications as prescribed, monitoring and recording blood pressure, and adhering to a cardiac prudent/heart healthy diet.   Interventions:  Collaboration with Steele Sizer, MD regarding development and update of comprehensive plan of care as evidenced by provider attestation and co-signature Inter-disciplinary care team collaboration (see longitudinal plan of care) Reviewed medications and compliance with treatment plan. Remains compliant with treatment plan. Reviewed established BP parameters. Reports BP readings have been within range. No complaints of chest discomfort, palpitations, headaches or dizziness. Encouraged to continue monitoring BP Advised to keep the CCM Pharmacist updated of medication concerns. Reviewed s/sx of heart attack, stroke and worsening symptoms that require immediate medical attention.    Patient Goals/Self-Care Activities: Self-administer medications as prescribed  Attend all scheduled provider appointments Monitor and record blood pressure Adhere to recommended cardiac prudent/heart healthy diet Notify provider or care management team with questions and new concerns as  needed        PLAN Ms. [place] will speak with her Rheumatologist to discuss options for treatment related to myalgia and joint pain A member of the care management team will follow up in two months to confirm care management needs.    Cristy [facility] Care Management [facility] [phone]

## 2021-10-07 ENCOUNTER — Other Ambulatory Visit: Payer: Self-pay | Admitting: Family Medicine

## 2021-10-07 DIAGNOSIS — I10 Essential (primary) hypertension: Secondary | ICD-10-CM

## 2021-10-07 NOTE — Patient Instructions (Addendum)
Thank you for allowing the Chronic Care Management team to participate in your care.    Patient Care Plan: Asthma (Adult)     Problem Identified: Symptom Exacerbation (Asthma)      Long-Range Goal: Symptom Exacerbation Prevented or Minimized Completed 10/04/2021  Start Date: 05/15/2021  Expected End Date: 09/12/2021  Priority: High  Note:    Current Barriers:  Chronic Disease Management support and educational needs r/t Asthma  Case Manager Clinical Goal(s): Over the next 120 days, patient will not require hospitalization for complications r/t Asthma exacerbation   Interventions:  Collaboration with Steele Sizer, MD regarding development and update of comprehensive plan of care as evidenced by provider attestation and co-signature Inter-disciplinary care team collaboration (see longitudinal plan of care) Reviewed current plan for asthma management. Reports compliance with medications. States symptoms have been well controlled with her Breo inhaler. Advised to continue taking medications as prescribed and notify provider if symptoms are not adequately relieved with regimen. Discussed importance of assessing symptoms daily d/t seasonal change and her tendency to experience symptoms during cooler weather.  Reviewed worsening symptoms that require immediate medical attention.  Encouraged to continue utilizing strategies to reduce risk of respiratory infection.    Self-Care Activities/Patient Goals: Take medications and utilize inhalers as prescribed Assess symptoms daily  Follow recommendations to prevent respiratory infection Notify provider or care management team with questions and new concerns as needed      Patient Care Plan: Hypertension and Hyperlipidemia     Problem Identified: Hypertension and Hyperlipidemia      Long-Range Goal: Hypertensionand Hyperlipidemia Monitored Completed 10/04/2021  Start Date: 05/15/2021  Expected End Date: 09/12/2021  Priority: High   Note:    Current Barriers:  Chronic Disease Management support and educational needs r/t Hypertension and Dyslipidemia.  Case Manager Clinical Goal(s):  Over the next 120 days, patient will demonstrate improved adherence to prescribed treatment plan as evidenced by taking all medications as prescribed, monitoring and recording blood pressure, and adhering to a cardiac prudent/heart healthy diet.   Interventions:  Collaboration with Steele Sizer, MD regarding development and update of comprehensive plan of care as evidenced by provider attestation and co-signature Inter-disciplinary care team collaboration (see longitudinal plan of care) Reviewed medications and compliance with treatment plan. Remains compliant with treatment plan. Reviewed established BP parameters. Reports BP readings have been within range. No complaints of chest discomfort, palpitations, headaches or dizziness. Encouraged to continue monitoring BP Advised to keep the CCM Pharmacist updated of medication concerns. Reviewed s/sx of heart attack, stroke and worsening symptoms that require immediate medical attention.    Patient Goals/Self-Care Activities: Self-administer medications as prescribed Attend all scheduled provider appointments Monitor and record blood pressure Adhere to recommended cardiac prudent/heart healthy diet Notify provider or care management team with questions and new concerns as needed         Ms. Figiel verbalized understanding of the information discussed during the telephonic outreach. Declined need for mailed instructions. She will speak with her Rheumatologist to discuss options for treatment related to myalgia and joint pain. A member of the care management team will follow up in two months to confirm care management needs.    [facility] [phone]

## 2021-10-09 ENCOUNTER — Ambulatory Visit: Payer: Medicare Other

## 2021-10-09 DIAGNOSIS — I10 Essential (primary) hypertension: Secondary | ICD-10-CM

## 2021-10-09 DIAGNOSIS — F325 Major depressive disorder, single episode, in full remission: Secondary | ICD-10-CM

## 2021-10-09 NOTE — Progress Notes (Signed)
Chronic Care Management Pharmacy Note  25/07/5276 Name:  Molly [name] MRN:  [id number] DOB:  [birth year]  Subjective: Molly [name] is an 70 y.o. year old female who is a primary patient of Steele Sizer, MD.  The CCM team was consulted for assistance with disease management and care coordination needs.    Engaged with patient by telephone for follow up visit in response to provider referral for pharmacy case management and/or care coordination services.   Consent to Services:  The patient was given information about Chronic Care Management services, agreed to services, and gave verbal consent prior to initiation of services.  Please see initial visit note for detailed documentation.   Patient Care Team: Steele Sizer, MD as PCP - General (Family Medicine) Kate Sable, MD as PCP - Cardiology (Cardiology) Rubie Maid, MD as Consulting Physician (Obstetrics and Gynecology) Clent Jacks, RN as Registered Nurse Noreene Filbert, MD as Referring Physician (Radiation Oncology) Sindy Guadeloupe, MD as Consulting Physician (Oncology) Mellody Drown, MD as Referring Physician (Obstetrics) Germaine Pomfret, [facility] (Pharmacist) Neldon Labella, RN as Case Manager Sunday Corn, Mervin Kung (Neurology)  Recent office visits: 08/26/2021 Dr.Sowles MD (PCP) start Contrave 8-90 mg, take one in am for one week, go up by one pill every week to a max of 2 twice daily  03/28/21: Video visit with Dr. Ancil Boozer for viral URI. Flovent stopped. Patient started on Breo 1 puff daily and bezonatate.  03/20/21: Patient presented to Dr. Ky Barban for myalgia. Clopidogrel stopped. CRP, Sed rate elevated.   Recent consult visits: 08/19/2021 Dr.Patel MD (Rheumatology) decrease prednisone to 5 MG tablet; Take 1.5 tablets (7.5 mg total) by mouth once daily for 14 days, THEN 1 tablet (5 mg total) once daily for 14 days, THEN 0.5 tablets (2.5 mg total) once daily for 14 days,Return in about 6  weeks 07/30/2021 [name] NP (Oncology) No medication changes noted,,Return in 6 months 06/28/2021 Dr.Anna MD (Gasrtienterology) No medication changes noted 06/17/2021 Dr.Patel MD (Rheumatology) patient reports stopping Zetia, Decrease Prednisone 15 mg/day for two weeks then stay on 10 mg/day,Return in about 5 weeks   03/27/21: Patient presented to Dr. Juanetta Snow (Oncology) for endometrial adenocarcinoma follow-up.  03/20/21: Patient presented to Dr. Melrose Nakayama (Neurology) for follow-up. Patient instructed to resume clopidogrel.  12/17/20: Patient presented to Dr. Garen Lah (Cardiology) for follow-up. Clopdigrel stopped due to myalgias.   Hospital visits: Admitted to the hospital on 07/15/2021 due to Colonoscopy. Discharge date was 07/15/2021. Discharged from [facility].    Objective:  Lab Results  Component Value Date   CREATININE 0.88 07/30/2021   BUN 17 07/30/2021   GFRNONAA >60 07/30/2021   GFRAA 81 11/07/2020   NA 138 07/30/2021   K 3.6 08/26/2021   CALCIUM 9.0 07/30/2021   CO2 28 07/30/2021   GLUCOSE 116 (H) 07/30/2021    Lab Results  Component Value Date/Time   HGBA1C 6.4 (H) 08/26/2021 11:59 AM   HGBA1C 6.2 (H) 04/23/2021 09:20 AM    Last diabetic Eye exam: No results found for: HMDIABEYEEXA  Last diabetic Foot exam: No results found for: HMDIABFOOTEX   Lab Results  Component Value Date   CHOL 196 08/26/2021   HDL 57 08/26/2021   LDLCALC 109 (H) 08/26/2021   TRIG 189 (H) 08/26/2021   CHOLHDL 3.4 08/26/2021    Hepatic Function Latest Ref Rng & Units 07/30/2021 01/29/2021 11/07/2020  Total Protein 6.5 - 8.1 g/dL 7.2 7.4 6.5  Albumin 3.5 - 5.0 g/dL 3.9 4.1 -  AST  15 - 41 U/L 21 23 15   ALT 0 - 44 U/L 17 19 12   Alk Phosphatase 38 - 126 U/L 67 77 -  Total Bilirubin 0.3 - 1.2 mg/dL 0.4 0.7 0.5    Lab Results  Component Value Date/Time   TSH 1.47 01/23/2017 08:29 AM    CBC Latest Ref Rng & Units 07/30/2021 01/29/2021 07/13/2020  WBC 4.0 - 10.5 K/uL 7.7  5.3 4.7  Hemoglobin 12.0 - 15.0 g/dL 11.8(L) 12.0 11.2(L)  Hematocrit 36.0 - 46.0 % 36.2 35.7(L) 33.7(L)  Platelets 150 - 400 K/uL 330 275 265    Lab Results  Component Value Date/Time   VD25OH 38 01/23/2017 08:29 AM    Clinical ASCVD: Yes  The ASCVD Risk score (Arnett DK, et al., 2019) failed to calculate for the following reasons:   The patient has a prior MI or stroke diagnosis    Depression screen [facility] 2/9 10/14/2021 08/26/2021 05/15/2021  Decreased Interest 0 0 0  Down, Depressed, Hopeless 0 0 0  PHQ - 2 Score 0 0 0  Altered sleeping 0 0 -  Tired, decreased energy 0 0 -  Change in appetite 0 0 -  Feeling bad or failure about yourself  0 0 -  Trouble concentrating 0 0 -  Moving slowly or fidgety/restless 0 0 -  Suicidal thoughts 0 0 -  PHQ-9 Score 0 0 -  Difficult doing work/chores Not difficult at all - -  Some recent data might be hidden     Social History   Tobacco Use  Smoking Status Never  Smokeless Tobacco Never  Tobacco Comments   smoking cessation materials not required   BP Readings from Last 3 Encounters:  10/23/21 122/61  10/14/21 124/68  08/26/21 136/70   Pulse Readings from Last 3 Encounters:  10/23/21 67  10/14/21 87  08/26/21 94   Wt Readings from Last 3 Encounters:  10/23/21 239 lb 8 oz (108.6 kg)  10/14/21 241 lb 12.8 oz (109.7 kg)  08/26/21 242 lb (109.8 kg)   BMI Readings from Last 3 Encounters:  10/23/21 48.37 kg/m  10/14/21 48.84 kg/m  08/26/21 48.88 kg/m    Assessment/Interventions: Review of patient past medical history, allergies, medications, health status, including review of consultants reports, laboratory and other test data, was performed as part of comprehensive evaluation and provision of chronic care management services.   SDOH:  (Social Determinants of Health) assessments and interventions performed: Yes SDOH Interventions    Flowsheet Row Most Recent Value  SDOH Interventions   Financial Strain Interventions  Intervention Not Indicated       SDOH Screenings   Alcohol Screen: Low Risk    Last Alcohol Screening Score (AUDIT): 0  Depression (PHQ2-9): Low Risk    PHQ-2 Score: 0  Financial Resource Strain: Low Risk    Difficulty of Paying Living Expenses: Not hard at all  Food Insecurity: No Food Insecurity   Worried About Charity fundraiser in the Last Year: Never true   Ran Out of Food in the Last Year: Never true  Housing: Low Risk    Last Housing Risk Score: 0  Physical Activity: Inactive   Days of Exercise per Week: 0 days   Minutes of Exercise per Session: 0 min  Social Connections: Moderately Integrated   Frequency of Communication with Friends and Family: More than three times a week   Frequency of Social Gatherings with Friends and Family: Three times a week   Attends Religious Services: More than  4 times per year   Active Member of Clubs or Organizations: Yes   Attends Music therapist: More than 4 times per year   Marital Status: Divorced  Stress: No Stress Concern Present   Feeling of Stress : Not at all  Tobacco Use: Low Risk    Smoking Tobacco Use: Never   Smokeless Tobacco Use: Never   Passive Exposure: Not on file  Transportation Needs: No Transportation Needs   Lack of Transportation (Medical): No   Lack of Transportation (Non-Medical): No    CCM Care Plan  Allergies  Allergen Reactions   Ferumoxytol Anaphylaxis   Liraglutide Other (See Comments)    pancreatitis   Nsaids Hives, Rash and Nausea And Vomiting   Clopidogrel Other (See Comments)    Reports intolerance and states causes myalgias   Statins Other (See Comments) and Nausea And Vomiting    Joint pains   Oxycodone Nausea And Vomiting   Crestor [Rosuvastatin Calcium] Rash   Zetia [Ezetimibe] Other (See Comments)    Muscle weakness and joint pain    Medications Reviewed Today     Reviewed by Maia Breslow, CMA (Certified Medical Assistant) on 10/23/21 at 0857  Med List Status:  <None>   Medication Order Taking? Sig Documenting Provider Last Dose Status Informant  acetaminophen (TYLENOL) 500 MG tablet [id number]  Take 2 tablets (1,000 mg total) by mouth every 6 (six) hours as needed for mild pain or moderate pain. Rubie Maid, MD  Active Self  albuterol (VENTOLIN HFA) 108 (90 Base) MCG/ACT inhaler [id number]  Inhale 2 puffs into the lungs every 4 (four) hours as needed for wheezing or shortness of breath. Steele Sizer, MD  Active   clopidogrel (PLAVIX) 75 MG tablet [id number]  Take 75 mg by mouth daily. [provider]  Active   diclofenac Sodium (VOLTAREN) 1 % GEL [id number]  Apply 4 grams to the skin 4 times daily. Need appt. Steele Sizer, MD  Active   escitalopram (LEXAPRO) 10 MG tablet [id number]  Take 1 tablet (10 mg total) by mouth daily. Steele Sizer, MD  Active   Evolocumab (REPATHA) 140 MG/ML Babette Relic [id number]  Inject 140 mg into the skin every 14 (fourteen) days. Steele Sizer, MD  Active   fluticasone furoate-vilanterol (BREO ELLIPTA) 100-25 MCG/INH AEPB [id number]  Inhale 1 puff into the lungs daily. Steele Sizer, MD  Active   furosemide (LASIX) 40 MG tablet [id number]  Take 1 tablet (40 mg total) by mouth daily. Steele Sizer, MD  Active            Med Note Minerva Ends, Linn N   Wed Jun 26, 2021  9:19 AM) Reports taking as needed  ketoconazole (NIZORAL) 2 % cream [id number]  APPLY TO THE AFFECTED AREA(S) of abdominal fold ONCE DAILY AS NEEDED Steele Sizer, MD  Active   levocetirizine (XYZAL) 5 MG tablet [id number]  Take 1 tablet by mouth every evening Steele Sizer, MD  Active   olmesartan-hydrochlorothiazide [facility] HCT) 20-12.5 MG tablet [id number]  Take 1 tablet by mouth daily Bo Merino, FNP  Active   pantoprazole (PROTONIX) 20 MG tablet [id number]  Take 1 tablet (20 mg total) by mouth daily. Steele Sizer, MD  Active   potassium chloride SA (KLOR-CON) 20 MEQ tablet [id number]  Take 1 tablet (20 mEq total) by mouth 2 (two) times  daily. 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 ([place]) 01/11/2019   Iron deficiency anemia 11/15/2018   Endometrial adenocarcinoma ([place]) 44/02/4741   Umbilical hernia without obstruction and without gangrene    Atherosclerosis of right coronary artery 11/10/2017   Atherosclerosis of abdominal aorta ([place]) 11/10/2017   Prediabetes 10/02/2017   Perennial allergic rhinitis with seasonal variation 05/14/2016   Asthma, mild intermittent, well-controlled 05/14/2016   Hypertension goal BP (blood pressure) < 140/90 12/10/2015   Cardiomyopathy due to hypertension ([place]) 12/10/2015   History of CVA (cerebrovascular accident) 12/10/2015   Hyperlipidemia LDL goal <70 12/10/2015   Statin intolerance 12/10/2015    Immunization History  Administered Date(s) Administered   Fluad Quad(high Dose 65+) 10/05/2019, 10/15/2020   Influenza, High Dose Seasonal PF 11/11/2016, 10/02/2017, 09/07/2018, 08/26/2021   Pneumococcal Conjugate-13 04/06/2018   Pneumococcal Polysaccharide-23 11/11/2016   Tdap 11/11/2016    Conditions to be addressed/monitored:  Hypertension, Hyperlipidemia, Coronary Artery Disease, Asthma, Depression and Allergic Rhinitis  Care Plan : General Pharmacy (Adult)  Updates made by Germaine Pomfret, RPH since 11/01/2021 12:00 AM     Problem: Hypertension, Hyperlipidemia, Coronary Artery Disease, Asthma, Depression and Allergic Rhinitis   Priority: High     Long-Range Goal: Patient-Specific Goal   Start Date: 04/03/2021   Expected End Date: 11/01/2022  This Visit's Progress: On track  Recent Progress: On track  Priority: High  Note:   Current Barriers:  Unable to independently monitor therapeutic efficacy Unable to achieve control of Cholesterol   Pharmacist Clinical Goal(s):  Patient will achieve control of blood pressure as evidenced by Home BP less than 140/90 maintain control of cholesterol as evidenced by LDL less than 70  through collaboration with PharmD and provider.   Interventions: 1:1 collaboration with Steele Sizer, MD regarding development and update of comprehensive plan of care as evidenced by provider attestation and co-signature Inter-disciplinary care team collaboration (see longitudinal plan of care) Comprehensive medication review performed; medication list updated in electronic medical record  Hypertension (BP goal <140/90) -Controlled -Current treatment: Furosemide 40 mg daily as needed  Olmesartan-HCTZ 20-12.5 mg daily -Medications previously tried: NA  -Current home readings: NA -Denies hypotensive/hypertensive symptoms -Educated on Daily salt intake goal < 2300 mg; Exercise goal of 150 minutes per week; Importance of home blood pressure monitoring; -Counseled to monitor BP at home weekly, document, and provide log at future appointments -Recommended to continue current medication  Hyperlipidemia: (LDL goal < 70) -History of TIA  -Uncontrolled -Current treatment: Repatha 420 mg every 30 days (not received)  -Current antiplatelet treatment: Clopidogrel 75 mg daily (evening)  -Medications previously tried: Statins, Aspirin -Patient has some myalgias, does not last all day. Managed with tylenol.  -Educated on Importance of limiting foods high in cholesterol; Strategies to manage statin-induced myalgias;  Asthma (Goal: control symptoms and prevent exacerbations) -Not ideally controlled -Current treatment  Ventolin HFA 2 puffs every 4 hours as needed Breo 1 puff  daily  -Current allergic rhinitis treatment  Levocetirizine 5 mg nightly  -Medications previously tried: NA  -Allergies normally worse in the fall.  -MMRC/CAT score: NA -Pulmonary function testing: NA -Exacerbations requiring treatment in last 6 months: Yes  -Patient denies consistent use of maintenance inhaler; only takes [employer]  PRN.  -Frequency of rescue inhaler use: 3 times daily  -Counseled on Proper inhaler technique; Benefits of consistent maintenance inhaler use -Recommended to continue current medication  Depression/Anxiety (Goal: Maintain stable mood) -Controlled -Current treatment: Escitalopram 10 mg daily (AM)  -Medications previously tried/failed: NA -PHQ9: 0 -GAD7: 0 -Connected with NA for mental health support -Educated on Benefits of medication for symptom control -Recommended to continue current medication  GERD (Goal: Prevent heartburn / reflux) -Controlled -Current treatment  Pantoprazole 20 mg daily (bedtime)  -Medications previously tried: NA  -Avoids pizza at night  -Counseled on trigger avoidance, not laying down after meals -Recommended to continue current medication  Chronic Pain (Goal: Minimize pain and maintain quality of life) -Controlled -Current treatment  Acetaminophen 500 mg 2 tablets  Voltaren 1 % gel  Pregabalin 100 mg twice daily  -Medications previously tried: NA -Counseled to limit acetaminophen use to < 3000 mg daily.   -Recommended to continue current medication   Patient Goals/Self-Care Activities Patient will:  - focus on medication adherence by utilizing [facility] services check blood pressure weekly, document, and provide at future appointments  Follow Up Plan: Telephone follow up appointment with care management team member scheduled for:  01/22/2022 at 11:00 AM       Medication Assistance: None required.  Patient affirms current coverage meets needs.  Patient's preferred pharmacy is:  [facility] - [place], [place] - [address] [place] [address] Phone: [phone] Fax: [phone]  [place], [place] - [address] [place] [address] Phone: [phone] Fax: [phone]  [place], [address] [place] [address] Phone: [phone] Fax: [phone]  Patient decided to: Utilize [facility] for medication synchronization, packaging and delivery  Care Plan and Follow Up Patient Decision:  Patient agrees to Care Plan and Follow-up.  Plan: Telephone follow up appointment with care management team member scheduled for:  01/22/2022 at 11:00 AM  Doristine Section, [facility] [phone]

## 2021-10-10 ENCOUNTER — Other Ambulatory Visit: Payer: Self-pay | Admitting: Family Medicine

## 2021-10-10 DIAGNOSIS — Z8673 Personal history of transient ischemic attack (TIA), and cerebral infarction without residual deficits: Secondary | ICD-10-CM

## 2021-10-10 DIAGNOSIS — L304 Erythema intertrigo: Secondary | ICD-10-CM

## 2021-10-14 ENCOUNTER — Ambulatory Visit (INDEPENDENT_AMBULATORY_CARE_PROVIDER_SITE_OTHER): Payer: Medicare Other | Admitting: Family Medicine

## 2021-10-14 ENCOUNTER — Other Ambulatory Visit: Payer: Self-pay

## 2021-10-14 ENCOUNTER — Encounter: Payer: Self-pay | Admitting: Family Medicine

## 2021-10-14 VITALS — BP 124/68 | HR 87 | Temp 98.4°F | Resp 16 | Ht 59.0 in | Wt 241.8 lb

## 2021-10-14 DIAGNOSIS — I700 Atherosclerosis of aorta: Secondary | ICD-10-CM | POA: Diagnosis not present

## 2021-10-14 DIAGNOSIS — J4520 Mild intermittent asthma, uncomplicated: Secondary | ICD-10-CM | POA: Diagnosis not present

## 2021-10-14 DIAGNOSIS — I43 Cardiomyopathy in diseases classified elsewhere: Secondary | ICD-10-CM

## 2021-10-14 DIAGNOSIS — G620 Drug-induced polyneuropathy: Secondary | ICD-10-CM

## 2021-10-14 DIAGNOSIS — F325 Major depressive disorder, single episode, in full remission: Secondary | ICD-10-CM

## 2021-10-14 DIAGNOSIS — M353 Polymyalgia rheumatica: Secondary | ICD-10-CM

## 2021-10-14 DIAGNOSIS — I119 Hypertensive heart disease without heart failure: Secondary | ICD-10-CM

## 2021-10-14 DIAGNOSIS — Z789 Other specified health status: Secondary | ICD-10-CM | POA: Diagnosis not present

## 2021-10-14 DIAGNOSIS — I10 Essential (primary) hypertension: Secondary | ICD-10-CM | POA: Diagnosis not present

## 2021-10-14 DIAGNOSIS — Z8673 Personal history of transient ischemic attack (TIA), and cerebral infarction without residual deficits: Secondary | ICD-10-CM | POA: Diagnosis not present

## 2021-10-14 DIAGNOSIS — T451X5A Adverse effect of antineoplastic and immunosuppressive drugs, initial encounter: Secondary | ICD-10-CM | POA: Diagnosis not present

## 2021-10-14 MED ORDER — REPATHA 140 MG/ML ~~LOC~~ SOSY: 140.0000 mg | PREFILLED_SYRINGE | SUBCUTANEOUS | 11 refills | Status: DC

## 2021-10-14 MED ORDER — ESCITALOPRAM OXALATE 10 MG PO TABS: 10.0000 mg | ORAL_TABLET | Freq: Every day | ORAL | 1 refills | Status: DC

## 2021-10-14 NOTE — Progress Notes (Signed)
Name: Molly [name]   MRN: [id number]    DOB: November 24, 1951   Date:10/14/2021       Progress Note  Subjective  Chief Complaint  Follow Up  HPI  Asthma: mild persistent: she is on daily Breo prn  and states no longer having daily cough, wheezing or SOB. She usually in the Fall and advised to resume Breo daily   Major Depression: currently in remission doing better emotionally with Lexapro. 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    Cardiomyopathy without CHF: lower extremity edema but mild and stable, she denies orthopnea or SOB , stable.    Endometrial cancer: s/p hysterectomy, finished the chemo and radiation therapy with Dr. Baruch Gouty , no longer has a port, she had a CT pelvis 10/2020 and negative for recurrence of cancer. She is up to date with visits with Dr. Janese Banks and Donella Stade, also sees GYN and last CA 125 was 11 . She has neuropathy from chemotherapy, she continues to have intermittent pain on her toe, but stable now   Morbid obesity: off victoza because of episode of pancreatitis,. She gained from 213 lbs to 236 lbs after treatment for endometrial cancer, last visit was 242 lbs and today is 241 lbs (stable) We gave her rx of Contrave but not approved by insurance  Dyslipidemia:  She has history of TIA and CAD, we finally her started on Repatha June 2022 since unable to tolerate statin therapy and Zetia was not getting LDL below 70 , last LDL , two months after starting Repatha was down from 130 to 109 but goal is below 70 , she states currently only using Repatha once a month, explained she needs to take it every two weeks.   HTN: bp today  is at goal, no chest pain, dizziness, or palpitation. Doing well on current medications   Atherosclerosis aorta: on zetia, because she cannot tolerate statin therapy , she denies side effects of medication, she started Repatha June 2022 and last LDL 109 but she was only taking Repatha one a month, advised to increase to twice month   GERD: symptoms  are well controlled, she has been off medication for months   PMR: she was seen by Dr. Ky Barban back in Feb 2022 , had negative ANA and RF, CRP and sed rate were very high for acute onset of pain on both shoulders with difficulty abduction and had to use other hand to assist abduction of right shoulder. When she saw me in April levels were still high and pain was still present we repeated SED rate and CRP and since still positive we referred her to Dr. 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 ([place]) 01/11/2019   Iron deficiency anemia 11/15/2018   Endometrial adenocarcinoma ([place]) 53/61/4431   Umbilical hernia without obstruction and without gangrene    Atherosclerosis of right coronary artery 11/10/2017   Atherosclerosis of abdominal aorta ([place]) 11/10/2017   Prediabetes 10/02/2017   Perennial allergic  rhinitis with seasonal variation 05/14/2016   Asthma, mild intermittent, well-controlled 05/14/2016   Hypertension goal BP (blood pressure) < 140/90 12/10/2015   Cardiomyopathy due to hypertension ([place]) 12/10/2015   History of CVA (cerebrovascular accident) 12/10/2015   Hyperlipidemia LDL goal <70 12/10/2015   Statin intolerance 12/10/2015    Past Surgical History:  Procedure Laterality Date   ABDOMINAL HYSTERECTOMY     CHOLECYSTECTOMY     COLONOSCOPY WITH PROPOFOL N/A 01/08/2017   Procedure: COLONOSCOPY WITH PROPOFOL;  Surgeon: [name],  MD;  Location: [facility] ENDOSCOPY;  Service: Endoscopy;  Laterality: N/A;   COLONOSCOPY WITH PROPOFOL N/A 05/05/2018   Procedure: COLONOSCOPY WITH PROPOFOL;  Surgeon: [name], MD;  Location: [facility] ENDOSCOPY;  Service: Gastroenterology;  Laterality: N/A;   COLONOSCOPY WITH PROPOFOL N/A 07/15/2021   Procedure: COLONOSCOPY WITH PROPOFOL;  Surgeon: [name], MD;  Location: [facility] ENDOSCOPY;  Service: Gastroenterology;  Laterality: N/A;   HERNIA REPAIR  71/6967   umbilical   PORTACATH PLACEMENT N/A 11/04/2018   Procedure: INSERTION PORT-A-CATH;  Surgeon: Jules Husbands, MD;  Location: [facility] ORS;  Service: General;  Laterality: N/A;   SENTINEL NODE BIOPSY N/A 10/06/2018   Procedure: SENTINEL NODE BIOPSY;  Surgeon: Mellody Drown, MD;  Location: [facility] ORS;  Service: Gynecology;  Laterality: N/A;   UMBILICAL HERNIA REPAIR N/A 11/17/2017   Procedure: HERNIA REPAIR UMBILICAL ADULT;  Surgeon: Jules Husbands, MD;  Location: [facility] ORS;  Service: General;  Laterality: N/A;    Family History  Problem Relation Age of Onset   Stroke Mother    Hypertension Mother    Dementia Mother    Alzheimer's disease Mother    Gout Father    Asthma Father    Hypertension Father    Dementia Father    Healthy Sister    Stroke Brother    Alzheimer's disease Brother    Healthy Daughter    Hypertension Brother    Healthy Brother    Cancer Paternal 34    Healthy Sister    Healthy Sister    Hypertension Sister    Hypertension Sister    Stroke Brother    Alzheimer's disease Brother    Stroke Brother    Hypertension Brother    Stroke Brother    Hypertension Brother    Healthy Brother    Healthy Brother    Hypertension Daughter    Breast cancer Neg Hx     Social History   Tobacco Use   Smoking status: Never   Smokeless tobacco: Never   Tobacco comments:    smoking cessation materials not required  Substance Use Topics   Alcohol use: No    Alcohol/week: 0.0 standard drinks     Current  Outpatient Medications:    acetaminophen (TYLENOL) 500 MG tablet, Take 2 tablets (1,000 mg total) by mouth every 6 (six) hours as needed for mild pain or moderate pain., Disp: 30 tablet, Rfl: 0   albuterol (VENTOLIN HFA) 108 (90 Base) MCG/ACT inhaler, Inhale 2 puffs into the lungs every 4 (four) hours as needed for wheezing or shortness of breath., Disp: 8.5 each, Rfl: 11   clopidogrel (PLAVIX) 75 MG tablet, Take 75 mg by mouth daily., Disp: , Rfl:    diclofenac Sodium (VOLTAREN) 1 % GEL, Apply 4 grams to the skin 4 times daily. Need appt., Disp: 300 g, Rfl: 0   escitalopram (LEXAPRO) 10 MG tablet, Take 1 tablet by mouth daily, Disp: 90 tablet, Rfl: 1   fluticasone furoate-vilanterol (BREO ELLIPTA) 100-25 MCG/INH  AEPB, Inhale 1 puff into the lungs daily., Disp: 180 each, Rfl: 1   furosemide (LASIX) 40 MG tablet, Take 1 tablet (40 mg total) by mouth daily., Disp: 90 tablet, Rfl: 0   ketoconazole (NIZORAL) 2 % cream, APPLY TO THE AFFECTED AREA(S) of abdominal fold ONCE DAILY AS NEEDED, Disp: 60 g, Rfl: 0   levocetirizine (XYZAL) 5 MG tablet, Take 1 tablet by mouth every evening, Disp: 30 tablet, Rfl: 11   olmesartan-hydrochlorothiazide (BENICAR HCT) 20-12.5 MG tablet, Take 1 tablet by mouth daily, Disp: 30 tablet, Rfl: 11   pantoprazole (PROTONIX) 20 MG tablet, Take 1 tablet (20 mg total) by mouth daily., Disp: 30 tablet, Rfl: 11   potassium chloride SA (KLOR-CON) 20 MEQ tablet, Take 1 tablet (20 mEq total) by mouth 2 (two) times daily., Disp: 180 tablet, Rfl: 1   pregabalin (LYRICA) 100 MG capsule, Take 1 capsule by mouth twice daily, Disp: 60 capsule, Rfl: 5   REPATHA 140 MG/ML SOSY, Inject 420 mg into the skin every 30 days, Disp: 3 mL, Rfl: 11   Naltrexone-buPROPion HCl ER (CONTRAVE) 8-90 MG TB12, Take 1 tablet by mouth in the morning and at bedtime. (Patient not taking: Reported on 10/14/2021), Disp: 120 tablet, Rfl: 0   predniSONE (DELTASONE) 5 MG tablet, Take 5 mg by mouth daily. 3 tablets once  a day (Patient not taking: Reported on 10/14/2021), Disp: , Rfl:   Allergies  Allergen Reactions   Ferumoxytol Anaphylaxis   Liraglutide Other (See Comments)    pancreatitis   Nsaids Hives, Rash and Nausea And Vomiting   Clopidogrel Other (See Comments)    Reports intolerance and states causes myalgias   Statins Other (See Comments) and Nausea And Vomiting    Joint pains   Oxycodone Nausea And Vomiting   Crestor [Rosuvastatin Calcium] Rash   Zetia [Ezetimibe] Other (See Comments)    Muscle weakness and joint pain    I personally reviewed active problem list, medication list, allergies, family history, social history, health maintenance with the patient/caregiver today.   ROS  Constitutional: Negative for fever or weight change.  Respiratory: Negative for cough and shortness of breath.   Cardiovascular: Negative for chest pain or palpitations.  Gastrointestinal: Negative for abdominal pain, no bowel changes.  Musculoskeletal: Negative for gait problem or joint swelling.  Skin: Negative for rash.  Neurological: Negative for dizziness or headache.  No other specific complaints in a complete review of systems (except as listed in HPI above).   Objective  Vitals:   10/14/21 1412  BP: 124/68  Pulse: 87  Resp: 16  Temp: 98.4 F (36.9 C)  TempSrc: Oral  SpO2: 96%  Weight: 241 lb 12.8 oz (109.7 kg)  Height: 4\' 11"  (1.499 m)    Body mass index is 48.84 kg/m.  Physical Exam  Constitutional: Patient appears well-developed and well-nourished. Obese  No distress.  HEENT: head atraumatic, normocephalic, pupils equal and reactive to light, neck supple Cardiovascular: Normal rate, regular rhythm and normal heart sounds.  No murmur heard. No BLE edema. Pulmonary/Chest: Effort normal and breath sounds normal. No respiratory distress. Abdominal: Soft.  There is no tenderness. Psychiatric: Patient has a normal mood and affect. behavior is normal. Judgment and thought content  normal.   Recent Results (from the past 2160 hour(s))  CA 125     Status: None   Collection Time: 07/30/21  9:40 AM  Result Value Ref Range   Cancer Antigen (CA) 125 11.0 0.0 - 38.1 U/mL  Comment: (NOTE) Roche [employer]ostics Electrochemiluminescence Immunoassay (ECLIA) Values obtained with different assay methods or kits cannot be used interchangeably.  Results cannot be interpreted as absolute evidence of the presence or absence of malignant disease. Performed At: [facility] [place], [place] [id number] Rush Farmer MD GH:[id number]   Comprehensive metabolic panel     Status: Abnormal   Collection Time: 07/30/21  9:40 AM  Result Value Ref Range   Sodium 138 135 - 145 mmol/L   Potassium 3.2 (L) 3.5 - 5.1 mmol/L   Chloride 99 98 - 111 mmol/L   CO2 28 22 - 32 mmol/L   Glucose, Bld 116 (H) 70 - 99 mg/dL    Comment: Glucose reference range applies only to samples taken after fasting for at least 8 hours.   BUN 17 8 - 23 mg/dL   Creatinine, Ser 0.88 0.44 - 1.00 mg/dL   Calcium 9.0 8.9 - 10.3 mg/dL   Total Protein 7.2 6.5 - 8.1 g/dL   Albumin 3.9 3.5 - 5.0 g/dL   AST 21 15 - 41 U/L   ALT 17 0 - 44 U/L   Alkaline Phosphatase 67 38 - 126 U/L   Total Bilirubin 0.4 0.3 - 1.2 mg/dL   GFR, Estimated >60 >60 mL/min    Comment: (NOTE) Calculated using the CKD-EPI Creatinine Equation (2021)    Anion gap 11 5 - 15    Comment: Performed at [facility], [address] [place], [place] [address]  CBC with Differential     Status: Abnormal   Collection Time: 07/30/21  9:40 AM  Result Value Ref Range   WBC 7.7 4.0 - 10.5 K/uL   RBC 3.86 (L) 3.87 - 5.11 MIL/uL   Hemoglobin 11.8 (L) 12.0 - 15.0 g/dL   HCT 36.2 36.0 - 46.0 %   MCV 93.8 80.0 - 100.0 fL   MCH 30.6 26.0 - 34.0 pg   MCHC 32.6 30.0 - 36.0 g/dL   RDW 13.2 11.5 - 15.5 %   Platelets 330 150 - 400 K/uL   nRBC 0.0 0.0 - 0.2 %   Neutrophils Relative % 56 %   Neutro Abs 4.3 1.7 - 7.7 K/uL    Lymphocytes Relative 35 %   Lymphs Abs 2.7 0.7 - 4.0 K/uL   Monocytes Relative 9 %   Monocytes Absolute 0.7 0.1 - 1.0 K/uL   Eosinophils Relative 0 %   Eosinophils Absolute 0.0 0.0 - 0.5 K/uL   Basophils Relative 0 %   Basophils Absolute 0.0 0.0 - 0.1 K/uL   Immature Granulocytes 0 %   Abs Immature Granulocytes 0.02 0.00 - 0.07 K/uL    Comment: Performed at [facility], [address] [place], [place] [address]  Lipid panel     Status: Abnormal   Collection Time: 08/26/21 11:59 AM  Result Value Ref Range   Cholesterol 196 <200 mg/dL   HDL 57 > OR = 50 mg/dL   Triglycerides 189 (H) <150 mg/dL   LDL Cholesterol (Calc) 109 (H) mg/dL (calc)    Comment: Reference range: <100 . Desirable range <100 mg/dL for primary prevention;   <70 mg/dL for patients with CHD or diabetic patients  with > or = 2 CHD risk factors. Marland Kitchen LDL-C is now calculated using the Martin-Hopkins  calculation, which is a validated novel method providing  better accuracy than the Friedewald equation in the  estimation of LDL-C.  Cresenciano Genre et al. Annamaria Helling. 1025;852(77): 2061-2068  (http://education.[employer].com/faq/FAQ164)  Total CHOL/HDL Ratio 3.4 <5.0 (calc)   Non-HDL Cholesterol (Calc) 139 (H) <130 mg/dL (calc)    Comment: For patients with diabetes plus 1 major ASCVD risk  factor, treating to a non-HDL-C goal of <100 mg/dL  (LDL-C of <70 mg/dL) is considered a therapeutic  option.   Hemoglobin A1c     Status: Abnormal   Collection Time: 08/26/21 11:59 AM  Result Value Ref Range   Hgb A1c MFr Bld 6.4 (H) <5.7 % of total Hgb    Comment: For someone without known diabetes, a hemoglobin  A1c value between 5.7% and 6.4% is consistent with prediabetes and should be confirmed with a  follow-up test. . For someone with known diabetes, a value <7% indicates that their diabetes is well controlled. A1c targets should be individualized based on duration of diabetes, age, comorbid conditions, and  other considerations. . This assay result is consistent with an increased risk of diabetes. . Currently, no consensus exists regarding use of hemoglobin A1c for diagnosis of diabetes for children. .    Mean Plasma Glucose 137 mg/dL   eAG (mmol/L) 7.6 mmol/L  Potassium     Status: None   Collection Time: 08/26/21 11:59 AM  Result Value Ref Range   Potassium 3.6 3.5 - 5.3 mmol/L      PHQ2/9: Depression screen [facility] 2/9 10/14/2021 08/26/2021 05/15/2021 05/07/2021 04/23/2021  Decreased Interest 0 0 0 0 0  Down, Depressed, Hopeless 0 0 0 0 0  PHQ - 2 Score 0 0 0 0 0  Altered sleeping 0 0 - - 0  Tired, decreased energy 0 0 - - 0  Change in appetite 0 0 - - 0  Feeling bad or failure about yourself  0 0 - - 0  Trouble concentrating 0 0 - - 0  Moving slowly or fidgety/restless 0 0 - - 0  Suicidal thoughts 0 0 - - 0  PHQ-9 Score 0 0 - - 0  Difficult doing work/chores Not difficult at all - - - -  Some recent data might be hidden    phq 9 is negative   Fall Risk: Fall Risk  10/14/2021 08/26/2021 05/15/2021 05/07/2021 04/23/2021  Falls in the past year? 0 0 0 0 0  Number falls in past yr: 0 0 0 0 0  Injury with Fall? 0 0 - 0 0  Risk for fall due to : No Fall Risks - Medication side effect;Other (Comment) No Fall Risks -  Risk for fall due to: Comment - - Joint Pain - -  Follow up Falls prevention discussed - Falls prevention discussed Falls prevention discussed -     Functional Status Survey: Is the patient deaf or have difficulty hearing?: No Does the patient have difficulty seeing, even when wearing glasses/contacts?: No Does the patient have difficulty concentrating, remembering, or making decisions?: No Does the patient have difficulty walking or climbing stairs?: No Does the patient have difficulty dressing or bathing?: No Does the patient have difficulty doing errands alone such as visiting a doctor's office or shopping?: No    Assessment & Plan  1. History of TIA  (transient ischemic attack)  - Evolocumab (REPATHA) 140 MG/ML SOSY; Inject 140 mg into the skin every 14 (fourteen) days.  Dispense: 2 mL; Refill: 11  2. Morbid obesity ([place])  Discussed with the patient the risk posed by an increased BMI. Discussed importance of portion control, calorie counting and at least 150 minutes of physical activity weekly. Avoid sweet beverages and drink  more water. Eat at least 6 servings of fruit and vegetables daily    3. History of CVA (cerebrovascular accident) without residual deficits  - Evolocumab (REPATHA) 140 MG/ML SOSY; Inject 140 mg into the skin every 14 (fourteen) days.  Dispense: 2 mL; Refill: 11  4. Hypertension goal BP (blood pressure) < 140/90   5. Cardiomyopathy due to hypertension, without heart failure ([place])   6. Atherosclerosis of abdominal aorta ([place])  On statin therapy   7. Statin intolerance   8. Peripheral neuropathy due to chemotherapy ([place])   9. PMR (polymyalgia rheumatica) ([facility])  Seeing Dr. Posey Pronto   10. Asthma, mild intermittent, well-controlled   11. Major depression in remission ([place])  - escitalopram (LEXAPRO) 10 MG tablet; Take 1 tablet (10 mg total) by mouth daily.  Dispense: 90 tablet; Refill: 1

## 2021-10-15 ENCOUNTER — Encounter: Payer: Self-pay | Admitting: Family Medicine

## 2021-10-23 ENCOUNTER — Other Ambulatory Visit: Payer: Self-pay

## 2021-10-23 ENCOUNTER — Inpatient Hospital Stay: Payer: Medicare Other | Attending: Obstetrics and Gynecology | Admitting: Nurse Practitioner

## 2021-10-23 VITALS — BP 122/61 | HR 67 | Temp 97.8°F | Resp 20 | Wt 239.5 lb

## 2021-10-23 DIAGNOSIS — Z9221 Personal history of antineoplastic chemotherapy: Secondary | ICD-10-CM | POA: Diagnosis not present

## 2021-10-23 DIAGNOSIS — Z90710 Acquired absence of both cervix and uterus: Secondary | ICD-10-CM | POA: Insufficient documentation

## 2021-10-23 DIAGNOSIS — Z08 Encounter for follow-up examination after completed treatment for malignant neoplasm: Secondary | ICD-10-CM

## 2021-10-23 DIAGNOSIS — Z9079 Acquired absence of other genital organ(s): Secondary | ICD-10-CM | POA: Insufficient documentation

## 2021-10-23 DIAGNOSIS — Z923 Personal history of irradiation: Secondary | ICD-10-CM | POA: Insufficient documentation

## 2021-10-23 DIAGNOSIS — Z7951 Long term (current) use of inhaled steroids: Secondary | ICD-10-CM | POA: Insufficient documentation

## 2021-10-23 DIAGNOSIS — Z8542 Personal history of malignant neoplasm of other parts of uterus: Secondary | ICD-10-CM | POA: Diagnosis not present

## 2021-10-23 DIAGNOSIS — Z79899 Other long term (current) drug therapy: Secondary | ICD-10-CM | POA: Diagnosis not present

## 2021-10-23 DIAGNOSIS — Z90722 Acquired absence of ovaries, bilateral: Secondary | ICD-10-CM | POA: Insufficient documentation

## 2021-10-23 DIAGNOSIS — I10 Essential (primary) hypertension: Secondary | ICD-10-CM | POA: Diagnosis not present

## 2021-10-23 DIAGNOSIS — Z8673 Personal history of transient ischemic attack (TIA), and cerebral infarction without residual deficits: Secondary | ICD-10-CM | POA: Insufficient documentation

## 2021-10-23 DIAGNOSIS — Z8616 Personal history of COVID-19: Secondary | ICD-10-CM | POA: Insufficient documentation

## 2021-10-23 DIAGNOSIS — C541 Malignant neoplasm of endometrium: Secondary | ICD-10-CM | POA: Diagnosis not present

## 2021-10-23 NOTE — Progress Notes (Signed)
Gynecologic Oncology Interval Visit   Referring Provider: Dr. Marcelline Mates  Chief Complaint: FIGO Stage IIIc1 grade 2 endometrioid endometrial cancer  Subjective:  Molly [name] is a 70 y.o. female, initially seen in consultation for Dr. Marcelline Mates, grade 2 endometrioid endometrial cancer, s/p TLH-BSO with SLN mapping and biopsies on 10/06/18 with Dr. Fransisca Connors and Dr. Marcelline Mates at [facility], followed by chemotherapy and radiation with vaginal cuff boost, who returns to clinic today for continued surveillance.   CA 125 has been followed by Dr. Janese Banks.  02/04/2019 11.4 07/18/2019 10.2 10/05/2019 9.6 07/13/2020 10.1 01/29/21  13 07/30/21  11.   She continues to feel well. Neuropathy is stable. She continues lyrica.     Gynecologic Oncology History:  She has history of Grade 3 uterine prolapse with Grade 1 cystocele and rectocele. She noted pessary had become dislodged after coughing d/t pneumonia and presented to Dr. Marcelline Mates for evaluation 05/2018. At that time she noted post-menopausal bleeding and cramping and has history of cervical polyps. Bleeding was felt to be possibly related to pessary.   Ultrasound revealed: uterus measuring 10.2 x 5.5 x 5.7 cm, heterogenous with evidence of fibroids invading endometrium measuring 2.9 x 3.3 x 3.6 cm and two additional fibroids measuring 2.0 x 2.1 x 1.8, and 2.9 x 3.3 x 3.6. Endometrium measuring 4.7 mm. Neither ovary was visualized.   She had second episode of bleeding 08/31/18-09/04/18, felt to be heavier and more 'period like'. Endometrial biopsy was performed. Pathology: - Endometrioid adenocarcinoma, figo grade 1.   Pathology:  DIAGNOSIS:  A. UTERUS WITH CERVIX AND BILATERAL FALLOPIAN TUBES AND OVARIES; TOTAL HYSTERECTOMY WITH BILATERAL SALPINGO-OOPHORECTOMY:  - ENDOMETRIAL ADENOCARCINOMA.  - SEE CANCER SUMMARY BELOW.  - CERVIX WITH NABOTHIAN CYSTS, CHRONIC CERVICITIS WITH SQUAMOUS METAPLASIA, AND 0.9 CM ENDOCERVICAL POLYP.  - INACTIVE BACKGROUND ENDOMETRIUM WITH  ENDOMETRIAL POLYP.  - MYOMETRIUM WITH ADENOMYOSIS AND LEIOMYOMATA, LARGEST MEASURING 4.5 CM.  - UNREMARKABLE FALLOPIAN TUBES AND OVARIES.   B.  SENTINEL LYMPH NODE, LEFT OBTURATOR; EXCISION:  - METASTATIC CARCINOMA INVOLVES ONE LYMPH NODE (1/1).   C.  SENTINEL LYMPH NODE, LEFT EXTERNAL ILIAC; EXCISION:  - LYMPHOID TISSUE NOT PRESENT.  - NEGATIVE FOR MALIGNANCY.   D.  SENTINEL LYMPH NODE, RIGHT EXTERNAL ILIAC; EXCISION:  - METASTATIC CARCINOMA INVOLVES ONE OF TWO LYMPH NODES (1/2).   LVSI present and atypical washings.  Invasion 6/18 and cervix and adnexa negative.   MLH1: LOSS of protein expression  MSH2: Intact nuclear expression  MSH6: Intact nuclear expression  PMS2: LOSS of protein expression   MLH1- positive methylation HER-2 negative  Pathologic Stage: FIGO stage IIIC1 grade 2  PET CT scan showed metastatic adenopathy and retroperitoneal and external iliac group of lymph nodes in the right side.  No evidence of metastatic disease elsewhere.  She received 6 cycles of carbo-taxol on 11/05/2018-02/25/2019. She completed radiation to pelvic and and periaortic nodes 03-31-2019-05/06/2019 with boost to vaginal cuff after 3rd cycle of chemotherapy. She completed on 06/15/2019.   She had a covid infection in September 2020. Port has been removed.   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 ([place]) 01/11/2019   Iron deficiency anemia 11/15/2018   Endometrial adenocarcinoma ([place]) 03/54/6568   Umbilical hernia  without obstruction and without gangrene    Atherosclerosis of right coronary artery 11/10/2017   Atherosclerosis of abdominal aorta ([place]) 11/10/2017    Prediabetes 10/02/2017   Perennial allergic rhinitis with seasonal variation 05/14/2016   Asthma, mild intermittent, well-controlled 05/14/2016   Hypertension goal BP (blood pressure) < 140/90 12/10/2015   Cardiomyopathy due to hypertension ([place]) 12/10/2015   History of CVA (cerebrovascular accident) 12/10/2015   Hyperlipidemia LDL goal <70 12/10/2015   Statin intolerance 12/10/2015    Past Medical History: Past Medical History:  Diagnosis Date   Anxiety    Asthma    history of asthma   Cardiomyopathy ([place])    Complication of anesthesia    tore hair out and made teeth rough   COVID-19 virus infection 09/08/2019   Depression    Endometrial cancer ([place]) 08/2018   GERD (gastroesophageal reflux disease)    takes prilosec prn   History of CVA (cerebrovascular accident) 12/10/2015   Hyperlipidemia LDL goal <70 12/10/2015   Hypertension    Prediabetes 10/02/2017   A1c 6 in January 2018   Stroke [facility]) 1990    no residual effects    Past Surgical History: Past Surgical History:  Procedure Laterality Date   ABDOMINAL HYSTERECTOMY     CHOLECYSTECTOMY     COLONOSCOPY WITH PROPOFOL N/A 01/08/2017   Procedure: COLONOSCOPY WITH PROPOFOL;  Surgeon: [name], MD;  Location: [facility] ENDOSCOPY;  Service: Endoscopy;  Laterality: N/A;   COLONOSCOPY WITH PROPOFOL N/A 05/05/2018   Procedure: COLONOSCOPY WITH PROPOFOL;  Surgeon: [name], MD;  Location[facility] ENDOSCOPY;  Service: Gastroenterology;  Laterality: N/A;   COLONOSCOPY WITH PROPOFOL N/A 07/15/2021   Procedure: COLONOSCOPY WITH PROPOFOL;  Surgeon: [name], MD;  Location: [facility] ENDOSCOPY;  Service: Gastroenterology;  Laterality: N/A;   HERNIA REPAIR  19/5093   umbilical   PORTACATH PLACEMENT N/A 11/04/2018   Procedure: INSERTION PORT-A-CATH;  Surgeon: Jules Husbands, MD;  Location: [facility] ORS;  Service: General;  Laterality: N/A;   SENTINEL NODE BIOPSY N/A 10/06/2018   Procedure: SENTINEL NODE BIOPSY;  Surgeon: Mellody Drown, MD;   Location: [facility] ORS;  Service: Gynecology;  Laterality: N/A;   UMBILICAL HERNIA REPAIR N/A 11/17/2017   Procedure: HERNIA REPAIR UMBILICAL ADULT;  Surgeon: Jules Husbands, MD;  Location: [facility] ORS;  Service: General;  Laterality: N/A;   Past Gynecologic History:  [id number] Denies abnormal pap smears.  History of OCP use.  Denies STD history  OB History:  OB History  Gravida Para Term Preterm AB Living  _0 SAB IAB Ectopic Multiple Live Births          2    # Outcome Date GA Lbr Len/2nd Weight Sex Delivery Anes PTL Lv  2 Term 1974    F Vag-Spont   LIV  1 Term 45    F Vag-Spont   LIV    Family History: Family History  Problem Relation Age of Onset   Stroke Mother    Hypertension Mother    Dementia Mother    Alzheimer's disease Mother    Gout Father    Asthma Father    Hypertension Father    Dementia Father    Healthy Sister    Stroke Brother    Alzheimer's disease Brother    Healthy Daughter    Hypertension Brother    Healthy Brother    Cancer Paternal 22    Healthy Sister    Healthy Sister    Hypertension  Sister    Hypertension Sister    Stroke Brother    Alzheimer's disease Brother    Stroke Brother    Hypertension Brother    Stroke Brother    Hypertension Brother    Healthy Brother    Healthy Brother    Hypertension Daughter    Breast cancer Neg Hx    Social History: Social History   Socioeconomic History   Marital status: Divorced    Spouse name: Not on file   Number of children: 2   Years of education: some college   Highest education level: 12th grade  Occupational History   Occupation: Retired    Comment: 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   Occupation: currently a Theme park manager  Tobacco Use   Smoking status: Never   Smokeless tobacco: Never   Tobacco comments:    smoking cessation materials not required  Vaping Use   Vaping Use: Never used  Substance and Sexual Activity   Alcohol use: No    Alcohol/week: 0.0 standard drinks   Drug  use: No   Sexual activity: Not Currently  Other Topics Concern   Not on file  Social History Narrative   Pt lives alone but currently staying with her daughter   Social Determinants of Health   Financial Resource Strain: Low Risk    Difficulty of Paying Living Expenses: Not hard at all  Food Insecurity: No Food Insecurity   Worried About Charity fundraiser in the Last Year: Never true   Arboriculturist in the Last Year: Never true  Transportation Needs: No Transportation Needs   Lack of Transportation (Medical): No   Lack of Transportation (Non-Medical): No  Physical Activity: Inactive   Days of Exercise per Week: 0 days   Minutes of Exercise per Session: 0 min  Stress: No Stress Concern Present   Feeling of Stress : Not at all  Social Connections: Moderately Integrated   Frequency of Communication with Friends and Family: More than three times a week   Frequency of Social Gatherings with Friends and Family: Three times a week   Attends Religious Services: More than 4 times per year   Active Member of Clubs or Organizations: Yes   Attends Music therapist: More than 4 times per year   Marital Status: Divorced  Human resources officer Violence: Not At Risk   Fear of Current or Ex-Partner: No   Emotionally Abused: No   Physically Abused: No   Sexually Abused: No    Allergies: Allergies  Allergen Reactions   Ferumoxytol Anaphylaxis   Liraglutide Other (See Comments)    pancreatitis   Nsaids Hives, Rash and Nausea And Vomiting   Clopidogrel Other (See Comments)    Reports intolerance and states causes myalgias   Statins Other (See Comments) and Nausea And Vomiting    Joint pains   Oxycodone Nausea And Vomiting   Crestor [Rosuvastatin Calcium] Rash   Zetia [Ezetimibe] Other (See Comments)    Muscle weakness and joint pain    Current Medications: Current Outpatient Medications  Medication Sig Dispense Refill   acetaminophen (TYLENOL) 500 MG tablet Take 2  tablets (1,000 mg total) by mouth every 6 (six) hours as needed for mild pain or moderate pain. 30 tablet 0   albuterol (VENTOLIN HFA) 108 (90 Base) MCG/ACT inhaler Inhale 2 puffs into the lungs every 4 (four) hours as needed for wheezing or shortness of breath. 8.5 each 11   clopidogrel (PLAVIX) 75 MG tablet  Take 75 mg by mouth daily.     diclofenac Sodium (VOLTAREN) 1 % GEL Apply 4 grams to the skin 4 times daily. Need appt. 300 g 0   escitalopram (LEXAPRO) 10 MG tablet Take 1 tablet (10 mg total) by mouth daily. 90 tablet 1   Evolocumab (REPATHA) 140 MG/ML SOSY Inject 140 mg into the skin every 14 (fourteen) days. 2 mL 11   fluticasone furoate-vilanterol (BREO ELLIPTA) 100-25 MCG/INH AEPB Inhale 1 puff into the lungs daily. 180 each 1   furosemide (LASIX) 40 MG tablet Take 1 tablet (40 mg total) by mouth daily. 90 tablet 0   ketoconazole (NIZORAL) 2 % cream APPLY TO THE AFFECTED AREA(S) of abdominal fold ONCE DAILY AS NEEDED 60 g 0   levocetirizine (XYZAL) 5 MG tablet Take 1 tablet by mouth every evening 30 tablet 11   olmesartan-hydrochlorothiazide (BENICAR HCT) 20-12.5 MG tablet Take 1 tablet by mouth daily 30 tablet 11   pantoprazole (PROTONIX) 20 MG tablet Take 1 tablet (20 mg total) by mouth daily. 30 tablet 11   potassium chloride SA (KLOR-CON) 20 MEQ tablet Take 1 tablet (20 mEq total) by mouth 2 (two) times daily. 180 tablet 1   pregabalin (LYRICA) 100 MG capsule Take 1 capsule by mouth twice daily 60 capsule 5   No current facility-administered medications for this visit.   Review of Systems General:  no complaints Skin: no complaints Eyes: no complaints HEENT: no complaints Breasts: no complaints Pulmonary: no complaints Cardiac: no complaints Gastrointestinal: no complaints Genitourinary/Sexual: no complaints Ob/Gyn: no complaints Musculoskeletal: no complaints Hematology: no complaints Neurologic/Psych: peripheral neuropathy   Objective:  Physical Examination:  BP  122/61   Pulse 67   Temp 97.8 F (36.6 C)   Resp 20   Wt 239 lb 8 oz (108.6 kg)   SpO2 100%   BMI 48.37 kg/m     ECOG Performance Status: 0 - Asymptomatic  GENERAL: Patient is a well appearing female in no acute distress HEENT:  Sclera clear. Anicteric NODES:  Negative axillary, supraclavicular, inguinal lymph node survery LUNGS:  Clear to auscultation bilaterally.   HEART:  Regular rate and rhythm.  ABDOMEN:  Soft, nontender.  No hernias, incisions well healed. No masses or ascites EXTREMITIES:  No peripheral edema. Atraumatic. No cyanosis SKIN:  Clear with no obvious rashes or skin changes.  NEURO:  Nonfocal. Well oriented.  Appropriate affect.  Pelvic: chaperoned by CMA. EGBUS: no lesions, discharge. Vagina: no bleeding, discharge. Vaginal cuff well healed. Vagina shortened post radiation. Atrophic. Bimanual: smooth, no masses. Rectal: deferred.   Assessment:  Molly [name] is a 70 y.o. female diagnosed with stage IIIC1 grade 2 endometrial cancer with bilateral positive pelvic SLNs with macroscopic tumor s/p TLH/BSO pelvic SLN biopsies 10/06/18.  Received sandwich therapy with 3 cycles of carboplatin/taxol, external pelvic radiation with vaginal brachytherapy and then 3 more cycles of chemotherapy completed 6/20.  Clinically, NED today.   Chemotherapy induced peripheral neuropathy- stable on lyrica  COVID19 infection 9/20.  Tumor has MLH1 loss with promoter methylation. HER-2 negative.   Medical co-morbidities complicating care: Morbid obesity, CVA 1990 with no residual and no medication, HTN with good BP today.    Plan:   Problem List Items Addressed This Visit   None  She is now 3 years from her surgery. Clinically asymptomatic. NED on exam. Continue surveillance. Tumor has MLH1 loss with promoter methylation, so she would be a candidate for immune checkpoint inhibitor therapy if she has a recurrence  in the future. HER-2 negative.   Continue follow up with Dr. Janese Banks and  Dr. Baruch Gouty.  Dr Janese Banks will continue to manage her neuropathy.  RTC in 6 months for pelvic exam or sooner if concerning symptoms. Will plan to alternate with MD for gyn-onc visits.   We will attempt to schedule her mammogram and bone density for her today which was ordered by her PCP. She declines covid vaccine. I encouraged annual flu vaccine.   Beckey Rutter, DNP, AGNP-[facility] [phone] (clinic)  CC:  Steele Sizer, [address],  [place] [phone]

## 2021-10-28 DIAGNOSIS — E785 Hyperlipidemia, unspecified: Secondary | ICD-10-CM

## 2021-10-28 DIAGNOSIS — J4520 Mild intermittent asthma, uncomplicated: Secondary | ICD-10-CM

## 2021-10-28 DIAGNOSIS — F325 Major depressive disorder, single episode, in full remission: Secondary | ICD-10-CM

## 2021-10-28 DIAGNOSIS — I10 Essential (primary) hypertension: Secondary | ICD-10-CM

## 2021-11-01 NOTE — Patient Instructions (Signed)
Visit Information It was great speaking with you today!  Please let me know if you have any questions about our visit.   Goals Addressed             This Visit's Progress    Track and Manage My Symptoms-Asthma       Timeframe:  Long-Range Goal Priority:  High Start Date: 04/03/2021                            Expected End Date:  10/03/2022                     Follow Up within 90 days   - avoid symptom triggers outdoors - eliminate symptom triggers at home - keep rescue medicines on hand  - take long-acting maintenance inhaler every daily    Why is this important?   Keeping track of asthma symptoms can tell you a lot about your asthma control.  Based on symptoms and peak flow results you can see how well you are doing.  Your asthma action plan has a green, yellow and red zone. Green means all is good; it is your goal. Yellow means your symptoms are a little worse. You will need to adjust your medications. Being in the red zone means that your   asthma is out of control. You will need to use your rescue medicines. You may need emergency care.     Notes:         Patient Care Plan: General Pharmacy (Adult)     Problem Identified: Hypertension, Hyperlipidemia, Coronary Artery Disease, Asthma, Depression and Allergic Rhinitis   Priority: High     Long-Range Goal: Patient-Specific Goal   Start Date: 04/03/2021  Expected End Date: 11/01/2022  This Visit's Progress: On track  Recent Progress: On track  Priority: High  Note:   Current Barriers:  Unable to independently monitor therapeutic efficacy Unable to achieve control of Cholesterol   Pharmacist Clinical Goal(s):  Patient will achieve control of blood pressure as evidenced by Home BP less than 140/90 maintain control of cholesterol as evidenced by LDL less than 70  through collaboration with PharmD and provider.   Interventions: 1:1 collaboration with Steele Sizer, MD regarding development and update of comprehensive  plan of care as evidenced by provider attestation and co-signature Inter-disciplinary care team collaboration (see longitudinal plan of care) Comprehensive medication review performed; medication list updated in electronic medical record  Hypertension (BP goal <140/90) -Controlled -Current treatment: Furosemide 40 mg daily as needed  Olmesartan-HCTZ 20-12.5 mg daily -Medications previously tried: NA  -Current home readings: NA -Denies hypotensive/hypertensive symptoms -Educated on Daily salt intake goal < 2300 mg; Exercise goal of 150 minutes per week; Importance of home blood pressure monitoring; -Counseled to monitor BP at home weekly, document, and provide log at future appointments -Recommended to continue current medication  Hyperlipidemia: (LDL goal < 70) -History of TIA  -Uncontrolled -Current treatment: Repatha 420 mg every 30 days (not received)  -Current antiplatelet treatment: Clopidogrel 75 mg daily (evening)  -Medications previously tried: Statins, Aspirin -Patient has some myalgias, does not last all day. Managed with tylenol.  -Educated on Importance of limiting foods high in cholesterol; Strategies to manage statin-induced myalgias;  Asthma (Goal: control symptoms and prevent exacerbations) -Not ideally controlled -Current treatment  Ventolin HFA 2 puffs every 4 hours as needed Breo 1 puff daily  -Current allergic rhinitis treatment  Levocetirizine 5 mg nightly  -  Medications previously tried: NA  -Allergies normally worse in the fall.  -MMRC/CAT score: NA -Pulmonary function testing: NA -Exacerbations requiring treatment in last 6 months: Yes  -Patient denies consistent use of maintenance inhaler; only takes Breo PRN.  -Frequency of rescue inhaler use: 3 times daily  -Counseled on Proper inhaler technique; Benefits of consistent maintenance inhaler use -Recommended to continue current medication  Depression/Anxiety (Goal: Maintain stable  mood) -Controlled -Current treatment: Escitalopram 10 mg daily (AM)  -Medications previously tried/failed: NA -PHQ9: 0 -GAD7: 0 -Connected with NA for mental health support -Educated on Benefits of medication for symptom control -Recommended to continue current medication  GERD (Goal: Prevent heartburn / reflux) -Controlled -Current treatment  Pantoprazole 20 mg daily (bedtime)  -Medications previously tried: NA  -Avoids pizza at night  -Counseled on trigger avoidance, not laying down after meals -Recommended to continue current medication  Chronic Pain (Goal: Minimize pain and maintain quality of life) -Controlled -Current treatment  Acetaminophen 500 mg 2 tablets  Voltaren 1 % gel  Pregabalin 100 mg twice daily  -Medications previously tried: NA -Counseled to limit acetaminophen use to < 3000 mg daily.   -Recommended to continue current medication   Patient Goals/Self-Care Activities Patient will:  - focus on medication adherence by utilizing [facility] services check blood pressure weekly, document, and provide at future appointments  Follow Up Plan: Telephone follow up appointment with care management team member scheduled for:  01/22/2022 at 11:00 AM    Patient agreed to services and verbal consent obtained.   Patient verbalizes understanding of instructions provided today and agrees to view in [place].   Malva Limes, [facility] [phone]

## 2021-11-05 ENCOUNTER — Telehealth: Payer: Self-pay

## 2021-11-05 NOTE — Progress Notes (Signed)
Chronic Care Management Pharmacy Assistant   Name: Molly [name]  MRN: [id number] DOB: 11/26/1951  Reason for Encounter: Medication Review.Medication Coordination Call.   Recent office visits:  10/14/2021 Dr.Sowles MD (PCP) Change Evolocumab 140 mg subcutaneous every 14 days, Contrave 8-90 mg -patient not taking.  Recent consult visits:  10/23/2021 Beckey Rutter NP (Oncology)No medication changes noted, Follow up in 6 months  Hospital visits:  None in previous 6 months  Medications: Outpatient Encounter Medications as of 11/05/2021  Medication Sig Note   acetaminophen (TYLENOL) 500 MG tablet Take 2 tablets (1,000 mg total) by mouth every 6 (six) hours as needed for mild pain or moderate pain.    albuterol (VENTOLIN HFA) 108 (90 Base) MCG/ACT inhaler Inhale 2 puffs into the lungs every 4 (four) hours as needed for wheezing or shortness of breath.    clopidogrel (PLAVIX) 75 MG tablet Take 75 mg by mouth daily.    diclofenac Sodium (VOLTAREN) 1 % GEL Apply 4 grams to the skin 4 times daily. Need appt.    escitalopram (LEXAPRO) 10 MG tablet Take 1 tablet (10 mg total) by mouth daily.    Evolocumab (REPATHA) 140 MG/ML SOSY Inject 140 mg into the skin every 14 (fourteen) days.    fluticasone furoate-vilanterol (BREO ELLIPTA) 100-25 MCG/INH AEPB Inhale 1 puff into the lungs daily.    furosemide (LASIX) 40 MG tablet Take 1 tablet (40 mg total) by mouth daily. 06/26/2021: Reports taking as needed   ketoconazole (NIZORAL) 2 % cream APPLY TO THE AFFECTED AREA(S) of abdominal fold ONCE DAILY AS NEEDED    levocetirizine (XYZAL) 5 MG tablet Take 1 tablet by mouth every evening    olmesartan-hydrochlorothiazide (BENICAR HCT) 20-12.5 MG tablet Take 1 tablet by mouth daily    pantoprazole (PROTONIX) 20 MG tablet Take 1 tablet (20 mg total) by mouth daily.    potassium chloride SA (KLOR-CON) 20 MEQ tablet Take 1 tablet (20 mEq total) by mouth 2 (two) times daily.    pregabalin (LYRICA) 100 MG  capsule Take 1 capsule by mouth twice daily    No facility-administered encounter medications on file as of 11/05/2021.    Care Gaps: COVID Vaccine Shingrix Vaccine Mammogram Star Rating Drugs: Olmesartan-HCTZ 20-12.5 mg last filled on 10/10/2021 for 30 day supply at [facility]. Medication Fill Gaps: None  Reviewed chart for medication changes ahead of medication coordination call.   BP Readings from Last 3 Encounters:  10/23/21 122/61  10/14/21 124/68  08/26/21 136/70    Lab Results  Component Value Date   HGBA1C 6.4 (H) 08/26/2021     Patient obtains medications through Adherence Packaging  30 Days   Last adherence delivery included: Clopidogrel 75 mg 1 tablet daily-  evening meals Escitalopram 10 mg one tablet daily-  Breakfast Pregabalin 100 mg one Capsule two times daily- Breakfast,Evening meals Levocetirizine 5 mg one tablet daily - evening meals Olmesartan-HCTZ 20-12.5 mg one tablet daily -Breakfast Furosemide 40 mg daily - Breakfast Ketoconazole 2% cream PRN  Potassium Chloride 20 MEQ 1 tablet  two times daily-Breakfast, evening meals Pantoprazole  20 mg one tablet daily- Breakfast  Patient declined medications last month  Ventolin HFA 108 MCG/ACT -PRN Breo 100-25 MCG/ Inhaler - 1 puff daily (Adequate supply,patient states she only takes PRN) Coenzyme Q10 one capsule Daily (OTC) Contrave 8-90 mg 1 tablet 2 times daily - breakfast, Bedtime (take one in am for one week, go up by one pill every week to a max of 2 twice  daily Prior Authorization is needed, but patient was denied per clinical pharmacist, they were going to prescribe something different). Prednisone 5 mg daily (Patient not taking) Repatha 140 mg Inject 140 mg into the skin every 14 (fourteen) days.(Patient receive from [facility] due to cost , last filled 09/03/2021 for 30 day supply)  Patient is due for next adherence delivery on: 11/15/2021. Called patient and reviewed medications and  coordinated delivery.  This delivery to include: Clopidogrel 75 mg 1 tablet daily-  evening meals Escitalopram 10 mg one tablet daily-  Breakfast Pregabalin 100 mg one Capsule two times daily- Breakfast,Evening meals Levocetirizine 5 mg one tablet daily - evening meals Olmesartan-HCTZ 20-12.5 mg one tablet daily -Breakfast Furosemide 40 mg PRN (Vials, patient states her provider inform her  to take PRN) Ketoconazole 2% cream PRN  Potassium Chloride 20 MEQ 1 tablet  two times daily-Breakfast, evening meals Pantoprazole  20 mg one tablet daily- Breakfast Breo 100-25 MCG/ Inhaler - 1 puff daily  Completed acute form for Repatha 140 mg Inject 140 mg into the skin every 14 (fourteen) days to deliver on 11/20/2021.I ask [facility] if she was still needing a PA for Repatha.Per [facility], i dont think it is needing a PA. I am showing when we tried to run it the refill was too soon through insurance because that med is what she is still getting through the mail order.Inform patient per [facility], she needs to make sure to tell them to stop filling her medication in the future if that is the case. I know last time she wanted them through [place] they ran it the same day we called or the day before and they said it was too far in the process for them to reverse it for [place] to run it. I dont want that to happen to her again the next time it can be filled and [place] not being able to fill it for her.Patient verbalized understanding and states she will call them now.  Patient declined the following medications Coenzyme Q10 one capsule Daily (OTC) Ventolin HFA 108 MCG/ACT -PRN (Has two inhaler on hand as of 11/05/2021)  Patient needs refills for Ketoconazole, Furosemide .  Confirmed delivery date of 11/15/2021, advised patient that pharmacy will contact them the morning of delivery.  [place] Pharmacist Assistant [phone]

## 2021-11-08 ENCOUNTER — Other Ambulatory Visit: Payer: Self-pay | Admitting: Family Medicine

## 2021-11-08 DIAGNOSIS — G620 Drug-induced polyneuropathy: Secondary | ICD-10-CM

## 2021-11-08 DIAGNOSIS — R600 Localized edema: Secondary | ICD-10-CM

## 2021-11-08 DIAGNOSIS — L304 Erythema intertrigo: Secondary | ICD-10-CM

## 2021-11-08 DIAGNOSIS — T451X5A Adverse effect of antineoplastic and immunosuppressive drugs, initial encounter: Secondary | ICD-10-CM

## 2021-11-08 DIAGNOSIS — I119 Hypertensive heart disease without heart failure: Secondary | ICD-10-CM

## 2021-11-08 DIAGNOSIS — I10 Essential (primary) hypertension: Secondary | ICD-10-CM

## 2021-11-18 ENCOUNTER — Other Ambulatory Visit: Payer: Self-pay

## 2021-11-18 ENCOUNTER — Ambulatory Visit
Admission: RE | Admit: 2021-11-18 | Discharge: 2021-11-18 | Disposition: A | Discharge status: Home / Self Care | Payer: Medicare Other | Source: Ambulatory Visit | Attending: Family Medicine | Admitting: Family Medicine

## 2021-11-18 DIAGNOSIS — Z1231 Encounter for screening mammogram for malignant neoplasm of breast: Secondary | ICD-10-CM | POA: Diagnosis not present

## 2021-11-18 DIAGNOSIS — Z780 Asymptomatic menopausal state: Secondary | ICD-10-CM | POA: Insufficient documentation

## 2021-11-18 DIAGNOSIS — M8589 Other specified disorders of bone density and structure, multiple sites: Secondary | ICD-10-CM | POA: Diagnosis not present

## 2021-11-27 ENCOUNTER — Ambulatory Visit: Payer: Medicare Other | Admitting: Radiation Oncology

## 2021-12-03 ENCOUNTER — Other Ambulatory Visit: Payer: Self-pay

## 2021-12-03 ENCOUNTER — Ambulatory Visit: Payer: Self-pay

## 2021-12-03 ENCOUNTER — Telehealth (INDEPENDENT_AMBULATORY_CARE_PROVIDER_SITE_OTHER): Payer: Medicare Other | Admitting: Nurse Practitioner

## 2021-12-03 ENCOUNTER — Encounter: Payer: Self-pay | Admitting: Nurse Practitioner

## 2021-12-03 DIAGNOSIS — J4520 Mild intermittent asthma, uncomplicated: Secondary | ICD-10-CM

## 2021-12-03 DIAGNOSIS — J069 Acute upper respiratory infection, unspecified: Secondary | ICD-10-CM | POA: Diagnosis not present

## 2021-12-03 DIAGNOSIS — I10 Essential (primary) hypertension: Secondary | ICD-10-CM

## 2021-12-03 MED ORDER — BENZONATATE 100 MG PO CAPS: 200.0000 mg | ORAL_CAPSULE | Freq: Two times a day (BID) | ORAL | 0 refills | Status: DC | PRN

## 2021-12-03 MED ORDER — CETIRIZINE HCL 10 MG PO TABS: 10.0000 mg | ORAL_TABLET | Freq: Every day | ORAL | 1 refills | Status: AC

## 2021-12-03 NOTE — Chronic Care Management (AMB) (Signed)
Chronic Care Management   CCM RN Visit Note  [id number] Name: Molly [name] MRN: [id number] DOB: [birth year]  Subjective: Molly [name] is a 70 y.o. year old female who is a primary care patient of Steele Sizer, MD. The care management team was consulted for assistance with disease management and care coordination needs.    Engaged with patient by telephone for follow up visit in response to provider referral for case management and care coordination services.   Consent to Services:  The patient was given information about Chronic Care Management services, agreed to services, and gave verbal consent prior to initiation of services.  Please see initial visit note for detailed documentation.    Assessment: Review of patient past medical history, allergies, medications, health status, including review of consultants reports, laboratory and other test data, was performed as part of comprehensive evaluation and provision of chronic care management services.   SDOH (Social Determinants of Health) assessments and interventions performed:  No  CCM Care Plan  Allergies  Allergen Reactions   Ferumoxytol Anaphylaxis   Liraglutide Other (See Comments)    pancreatitis   Nsaids Hives, Rash and Nausea And Vomiting   Clopidogrel Other (See Comments)    Reports intolerance and states causes myalgias   Statins Other (See Comments) and Nausea And Vomiting    Joint pains   Oxycodone Nausea And Vomiting   Crestor [Rosuvastatin Calcium] Rash   Zetia [Ezetimibe] Other (See Comments)    Muscle weakness and joint pain    Outpatient Encounter Medications as of 12/03/2021  Medication Sig   acetaminophen (TYLENOL) 500 MG tablet Take 2 tablets (1,000 mg total) by mouth every 6 (six) hours as needed for mild pain or moderate pain.   albuterol (VENTOLIN HFA) 108 (90 Base) MCG/ACT inhaler Inhale 2 puffs into the lungs every 4 (four) hours as needed for wheezing or shortness of breath.   clopidogrel  (PLAVIX) 75 MG tablet Take 75 mg by mouth daily.   diclofenac Sodium (VOLTAREN) 1 % GEL Apply 4 grams to the skin 4 times daily. Need appt.   escitalopram (LEXAPRO) 10 MG tablet Take 1 tablet (10 mg total) by mouth daily.   Evolocumab (REPATHA) 140 MG/ML SOSY Inject 140 mg into the skin every 14 (fourteen) days.   fluticasone furoate-vilanterol (BREO ELLIPTA) 100-25 MCG/INH AEPB Inhale 1 puff into the lungs daily.   furosemide (LASIX) 40 MG tablet TAKE ONE TABLET BY MOUTH EVERY MORNING   ketoconazole (NIZORAL) 2 % cream APPLY TO THE AFFECTED AREA(S) of abdominal fold ONCE DAILY AS NEEDED   levocetirizine (XYZAL) 5 MG tablet Take 1 tablet by mouth every evening   olmesartan-hydrochlorothiazide (BENICAR HCT) 20-12.5 MG tablet Take 1 tablet by mouth daily   pantoprazole (PROTONIX) 20 MG tablet Take 1 tablet (20 mg total) by mouth daily.   potassium chloride SA (KLOR-CON) 20 MEQ tablet Take 1 tablet (20 mEq total) by mouth 2 (two) times daily.   pregabalin (LYRICA) 100 MG capsule TAKE ONE CAPSULE BY MOUTH EVERY MORNING and TAKE ONE CAPSULE BY MOUTH EVERY EVENING   No facility-administered encounter medications on file as of 12/03/2021.    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 ([place]) 01/11/2019  Iron deficiency anemia 11/15/2018   Endometrial adenocarcinoma ([place]) 83/33/8329   Umbilical hernia without obstruction and without gangrene    Atherosclerosis of right coronary artery 11/10/2017   Atherosclerosis of abdominal aorta ([place]) 11/10/2017   Prediabetes 10/02/2017   Perennial allergic rhinitis with seasonal variation 05/14/2016   Asthma, mild intermittent, well-controlled 05/14/2016    Hypertension goal BP (blood pressure) < 140/90 12/10/2015   Cardiomyopathy due to hypertension ([place]) 12/10/2015   History of CVA (cerebrovascular accident) 12/10/2015   Hyperlipidemia LDL goal <70 12/10/2015   Statin intolerance 12/10/2015    Patient Care Plan: RN Care Management Plan of Care     Problem Identified: Disease Progression (Hypertension)      Long-Range Goal: Disease Progression Prevented or Minimized   Start Date: 12/03/2021  Expected End Date: 03/03/2022  Priority: High  Note:   Current Barriers:  Chronic Disease Management support and education needs related to HTN, HLD, and Asthma   RNCM Clinical Goal(s):  Patient will demonstrate ongoing adherence to prescribed treatment plan for HTN, HLD, and Asthma through collaboration with the provider, RN Care manager and care team.   Interventions: 1:1 collaboration with primary care provider regarding development and update of comprehensive plan of care as evidenced by provider attestation and co-signature Inter-disciplinary care team collaboration (see longitudinal plan of care) Evaluation of current treatment plan related to  self management and patient's adherence to plan as established by provider   Asthma:  Reviewed medications and plan for asthma management. Discussed recent symptoms. Reports a productive cough and congestion since the weekend. Described sputum as "thick and yellow." Reports administering Theraflu but symptoms have not resolved. Continues to use inhalers as prescribed. Denies episodes of shortness of breath.  Collaborated with the scheduling staff to coordinate a virtual visit today.  Thoroughly discussed indications and worsening symptoms that require immediate medical attention. Patient verbalized understanding and agreed to seek care at the Urgent Care or ER if her symptoms worsen or she experiences shortness of breath prior to the virtual outreach later today.   Hyperlipidemia Interventions:   Lab  Results  Component Value Date   CHOL 196 08/26/2021   HDL 57 08/26/2021   LDLCALC 109 (H) 08/26/2021   TRIG 189 (H) 08/26/2021   CHOLHDL 3.4 08/26/2021    Medication review performed. Advised to continue taking medications as prescribed and notify provider if unable to tolerate regimen. Provider established cholesterol goals reviewed Counseled on importance of regular laboratory monitoring as prescribed Reviewed importance of limiting foods high in cholesterol   Hypertension Interventions:   Last practice recorded BP readings:  BP Readings from Last 3 Encounters:  10/23/21 122/61  10/14/21 124/68  08/26/21 136/70  Most recent eGFR/CrCl: No results found for: EGFR  No components found for: CRCL  Reviewed plan for hypertension management. Reviewed established blood pressure parameters along with indications for notifying a provider. Encouraged to monitor and record readings.  Discussed compliance with recommended cardiac prudent diet. Encouraged to read nutrition labels and avoid highly processed foods when possible. Reviewed s/sx of heart attack, stroke and worsening symptoms that require immediate medical attention.   Patient Goals/Self-Care Activities: Take all medications as prescribed Attend all scheduled provider appointments Call pharmacy for medication refills 3-7 days in advance of running out of medications Attend church or other social activities Perform all self care activities independently  Call provider office for new concerns or questions     Follow Up Plan:   Will follow up within the next week.  PLAN A member of the care management team will follow up within the next week.   [facility] Care [facility] [phone]

## 2021-12-03 NOTE — Progress Notes (Signed)
Name: Molly [name]   MRN: [id number]    DOB: [birth year]   Date:12/03/2021       Progress Note  Subjective  Chief Complaint  Chief Complaint  Patient presents with   URI    cough, congested, runny nose, sneezing, itching watery eyes    I connected with  Molly [name]  on 52/84/13 at 1550 by a telephone enabled telemedicine application and verified that I am speaking with the correct person using two identifiers.  I discussed the limitations of evaluation and management by telemedicine and the availability of in person appointments. The patient expressed understanding and agreed to proceed with a virtual visit  Staff also discussed with the patient that there may be a patient responsible charge related to this service. Patient Location: home Provider Location: [facility] Additional Individuals present: alone  HPI  URI: She says that she has had a cough, nasal congestion, runny nose, sneezing and itchy water eyes for a few days.  She says she has been taking her albuterol and breo.  She denies any fever or shortness of breath.  She says her main complaint is her sinus congestion.  She says she has a lot of pressure across her face.  Discussed OTC treatments to use for symptoms.  She declines Covid testing.  Will send in prescription for tessalon perls and zyrtec.  Discussed pushing fluids.    Asthma: she says that she is using her albuterol and breo as prescribed.  She denies any shortness of breath, wheezing or chest pain.    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 ([place]) 01/11/2019   Iron deficiency anemia 11/15/2018   Endometrial adenocarcinoma ([place]) 24/40/1027   Umbilical hernia  without obstruction and without gangrene    Atherosclerosis of right coronary artery 11/10/2017   Atherosclerosis of abdominal aorta ([place]) 11/10/2017   Prediabetes 10/02/2017   Perennial allergic rhinitis with seasonal variation 05/14/2016   Asthma, mild intermittent, well-controlled 05/14/2016   Hypertension goal BP (blood pressure) < 140/90 12/10/2015   Cardiomyopathy due to hypertension ([place]) 12/10/2015   History of CVA (cerebrovascular accident) 12/10/2015   Hyperlipidemia LDL goal <70 12/10/2015   Statin intolerance 12/10/2015    Social History   Tobacco Use   Smoking status: Never   Smokeless tobacco: Never   Tobacco comments:    smoking cessation materials not required  Substance Use Topics   Alcohol use: No    Alcohol/week: 0.0 standard drinks     Current Outpatient Medications:    acetaminophen (TYLENOL) 500 MG tablet, Take 2 tablets (1,000 mg total) by mouth every 6 (six) hours as needed for mild pain or moderate pain., Disp: 30 tablet, Rfl: 0   albuterol (VENTOLIN HFA) 108 (90 Base) MCG/ACT inhaler, Inhale 2 puffs into the lungs every 4 (four) hours as needed for wheezing or shortness of breath., Disp: 8.5 each, Rfl: 11   clopidogrel (PLAVIX) 75 MG tablet, Take 75 mg by mouth daily., Disp: , Rfl:    diclofenac Sodium (VOLTAREN) 1 % GEL, Apply 4 grams to the skin 4 times daily. Need appt., Disp: 300 g, Rfl: 0   escitalopram (LEXAPRO) 10 MG tablet, Take 1 tablet (10 mg total) by mouth daily.,  Disp: 90 tablet, Rfl: 1   Evolocumab (REPATHA) 140 MG/ML SOSY, Inject 140 mg into the skin every 14 (fourteen) days., Disp: 2 mL, Rfl: 11   fluticasone furoate-vilanterol (BREO ELLIPTA) 100-25 MCG/INH AEPB, Inhale 1 puff into the lungs daily., Disp: 180 each, Rfl: 1   furosemide (LASIX) 40 MG tablet, TAKE ONE TABLET BY MOUTH EVERY MORNING, Disp: 90 tablet, Rfl: 0   ketoconazole (NIZORAL) 2 % cream, APPLY TO THE AFFECTED AREA(S) of abdominal fold ONCE DAILY AS NEEDED, Disp: 60 g, Rfl:  0   levocetirizine (XYZAL) 5 MG tablet, Take 1 tablet by mouth every evening, Disp: 30 tablet, Rfl: 11   olmesartan-hydrochlorothiazide (BENICAR HCT) 20-12.5 MG tablet, Take 1 tablet by mouth daily, Disp: 30 tablet, Rfl: 11   pantoprazole (PROTONIX) 20 MG tablet, Take 1 tablet (20 mg total) by mouth daily., Disp: 30 tablet, Rfl: 11   potassium chloride SA (KLOR-CON) 20 MEQ tablet, Take 1 tablet (20 mEq total) by mouth 2 (two) times daily., Disp: 180 tablet, Rfl: 1   pregabalin (LYRICA) 100 MG capsule, TAKE ONE CAPSULE BY MOUTH EVERY MORNING and TAKE ONE CAPSULE BY MOUTH EVERY EVENING, Disp: 180 capsule, Rfl: 1  Allergies  Allergen Reactions   Ferumoxytol Anaphylaxis   Liraglutide Other (See Comments)    pancreatitis   Nsaids Hives, Rash and Nausea And Vomiting   Clopidogrel Other (See Comments)    Reports intolerance and states causes myalgias   Statins Other (See Comments) and Nausea And Vomiting    Joint pains   Oxycodone Nausea And Vomiting   Crestor [Rosuvastatin Calcium] Rash   Zetia [Ezetimibe] Other (See Comments)    Muscle weakness and joint pain    I personally reviewed active problem list, medication list, allergies, notes from last encounter with the patient/caregiver today.  ROS  Constitutional: Negative for fever or weight change.  HEENT: positive for nasal congestion, sneezing, itchy water eyes Respiratory: Positive for cough, negative for shortness of breath.   Cardiovascular: Negative for chest pain or palpitations.  Gastrointestinal: Negative for abdominal pain, no bowel changes.  Musculoskeletal: Negative for gait problem or joint swelling.  Skin: Negative for rash.  Neurological: Negative for dizziness or headache.  No other specific complaints in a complete review of systems (except as listed in HPI above).   Objective  Virtual encounter, vitals not obtained.  There is no height or weight on file to calculate BMI.  Nursing Note and Vital Signs  reviewed.  Physical Exam  Awake alert and oriented, speaking in complete sentences  No results found for this or any previous visit (from the past 72 hour(s)).  Assessment & Plan  1. Viral upper respiratory tract infection -push fluids -OTC treatments for symptoms - benzonatate (TESSALON) 100 MG capsule; Take 2 capsules (200 mg total) by mouth 2 (two) times daily as needed for cough.  Dispense: 20 capsule; Refill: 0 - cetirizine (ZYRTEC ALLERGY) 10 MG tablet; Take 1 tablet (10 mg total) by mouth daily.  Dispense: 30 tablet; Refill: 1  2. Asthma, mild intermittent, well-controlled -continue current treatment -monitor for exacerbation  -Red flags and when to present for emergency care or RTC including fever >101.62F, chest pain, shortness of breath, new/worsening/un-resolving symptoms, reviewed with patient at time of visit. Follow up and care instructions discussed and provided in AVS. - I discussed the assessment and treatment plan with the patient. The patient was provided an opportunity to ask questions and all were answered. The patient agreed with the plan and demonstrated  an understanding of the instructions.  I provided 20 minutes of non-face-to-face time during this encounter.  Bo Merino, FNP

## 2021-12-05 ENCOUNTER — Telehealth: Payer: Self-pay

## 2021-12-05 NOTE — Progress Notes (Signed)
Chronic Care Management Pharmacy Assistant   Name: Molly [name]  MRN: [id number] DOB: Mar 23, 1951  Reason for Encounter: Medication Review/Medication Coordination Call.  Recent office visits:  12/03/2021 Serafina Royals FNP (PCP Office) Start Benzonatate 200 mg PRN, Start Cetirizine 10 mg Dailt 12/03/2021 Felecia McCray RN [facility]) No Medication Changes noted  Recent consult visits:  No recent consult visit  Hospital visits:  None in previous 6 months  Medications: Outpatient Encounter Medications as of 12/05/2021  Medication Sig   acetaminophen (TYLENOL) 500 MG tablet Take 2 tablets (1,000 mg total) by mouth every 6 (six) hours as needed for mild pain or moderate pain.   albuterol (VENTOLIN HFA) 108 (90 Base) MCG/ACT inhaler Inhale 2 puffs into the lungs every 4 (four) hours as needed for wheezing or shortness of breath.   benzonatate (TESSALON) 100 MG capsule Take 2 capsules (200 mg total) by mouth 2 (two) times daily as needed for cough.   cetirizine (ZYRTEC ALLERGY) 10 MG tablet Take 1 tablet (10 mg total) by mouth daily.   clopidogrel (PLAVIX) 75 MG tablet Take 75 mg by mouth daily.   diclofenac Sodium (VOLTAREN) 1 % GEL Apply 4 grams to the skin 4 times daily. Need appt.   escitalopram (LEXAPRO) 10 MG tablet Take 1 tablet (10 mg total) by mouth daily.   Evolocumab (REPATHA) 140 MG/ML SOSY Inject 140 mg into the skin every 14 (fourteen) days.   fluticasone furoate-vilanterol (BREO ELLIPTA) 100-25 MCG/INH AEPB Inhale 1 puff into the lungs daily.   furosemide (LASIX) 40 MG tablet TAKE ONE TABLET BY MOUTH EVERY MORNING   ketoconazole (NIZORAL) 2 % cream APPLY TO THE AFFECTED AREA(S) of abdominal fold ONCE DAILY AS NEEDED   olmesartan-hydrochlorothiazide (BENICAR HCT) 20-12.5 MG tablet Take 1 tablet by mouth daily   pantoprazole (PROTONIX) 20 MG tablet Take 1 tablet (20 mg total) by mouth daily.   potassium chloride SA (KLOR-CON) 20 MEQ tablet Take 1 tablet (20 mEq total) by mouth 2  (two) times daily.   pregabalin (LYRICA) 100 MG capsule TAKE ONE CAPSULE BY MOUTH EVERY MORNING and TAKE ONE CAPSULE BY MOUTH EVERY EVENING   No facility-administered encounter medications on file as of 12/05/2021.    Care Gaps: COVID Vaccine Shingrix Vaccine  Star Rating Drugs: Olmesartan-HCTZ 20-12.5 mg last filled on 11/08/2021 for 30 day supply at [facility].  Medication Fill Gaps: None  Reviewed chart for medication changes ahead of medication coordination call.   BP Readings from Last 3 Encounters:  10/23/21 122/61  10/14/21 124/68  08/26/21 136/70    Lab Results  Component Value Date   HGBA1C 6.4 (H) 08/26/2021     Patient obtains medications through Adherence Packaging  30 Days   Last adherence delivery included:  Clopidogrel 75 mg 1 tablet daily-  evening meals Escitalopram 10 mg one tablet daily-  Breakfast Pregabalin 100 mg one Capsule two times daily- Breakfast,Evening meals Levocetirizine 5 mg one tablet daily - evening meals Olmesartan-HCTZ 20-12.5 mg one tablet daily -Breakfast Furosemide 40 mg PRN (Vials, patient states her provider inform her  to take PRN) Ketoconazole 2% cream PRN  Potassium Chloride 20 MEQ 1 tablet  two times daily-Breakfast, evening meals Pantoprazole  20 mg one tablet daily- Breakfast Breo 100-25 MCG/ Inhaler - 1 puff daily  Acute form was completed for Repatha 140 mg Inject 140 mg into the skin every 14 (fourteen) days to deliver on 11/20/2021  Patient declined medications last month: Coenzyme Q10 one capsule Daily (OTC)  Ventolin HFA 108 MCG/ACT -PRN (Has two inhaler on hand as of 11/05/2021)  Patient is due for next adherence delivery on: 12/17/2021. Called patient and reviewed medications and coordinated delivery.  This delivery to include: Clopidogrel 75 mg 1 tablet daily-  evening meals Escitalopram 10 mg one tablet daily-  Breakfast Pregabalin 100 mg one Capsule two times daily- Breakfast,Evening  meals Olmesartan-HCTZ 20-12.5 mg one tablet daily -Breakfast Ketoconazole 2% cream PRN  Potassium Chloride 20 MEQ 1 tablet  two times daily-Breakfast, evening meals Pantoprazole  20 mg one tablet daily- Breakfast Breo 100-25 MCG/ Inhaler - 1 puff daily  Repatha 140 mg Inject 140 mg into the skin every 14 (fourteen) days   Patient declined the following medications: Levocetirizine 5 mg one tablet daily - evening meals (Adequate supply)  Furosemide 40 mg PRN (Vials, patient states her provider inform her  to take PRN) Coenzyme Q10 one capsule Daily (OTC) Ventolin HFA 108 MCG/ACT -PRN (Has two inhaler on hand as of 11/05/2021)  Patient needs refills for Ketoconazole 2% cream  (Prescribe by PCP).  Confirmed delivery date of 12/17/2021 (2nd route), advised patient that pharmacy will contact them the morning of delivery.,  Telephone follow up appointment with Care management team member scheduled for : 01/22/2022 at 11:00 pm.  [place] Pharmacist Assistant [phone]

## 2021-12-06 ENCOUNTER — Ambulatory Visit: Payer: Self-pay

## 2021-12-06 DIAGNOSIS — J069 Acute upper respiratory infection, unspecified: Secondary | ICD-10-CM

## 2021-12-06 DIAGNOSIS — J4520 Mild intermittent asthma, uncomplicated: Secondary | ICD-10-CM

## 2021-12-06 NOTE — Chronic Care Management (AMB) (Signed)
Chronic Care Management   CCM RN Visit Note  16/12/958 Name: Molly [name] MRN: [id number] DOB: [birth year]  Subjective: Molly [name] is a 70 y.o. year old female who is a primary care patient of Steele Sizer, MD. The care management team was consulted for assistance with disease management and care coordination needs.    Engaged with patient by telephone for follow up visit in response to provider referral for case management and/or care coordination services.   Consent to Services:  The patient was given information about Chronic Care Management services, agreed to services, and gave verbal consent prior to initiation of services.  Please see initial visit note for detailed documentation.   Patient agreed to services and verbal consent obtained.   Assessment: Review of patient past medical history, allergies, medications, health status, including review of consultants reports, laboratory and other test data, was performed as part of comprehensive evaluation and provision of chronic care management services.   SDOH (Social Determinants of Health) assessments and interventions performed:    CCM Care Plan  Allergies  Allergen Reactions   Ferumoxytol Anaphylaxis   Liraglutide Other (See Comments)    pancreatitis   Nsaids Hives, Rash and Nausea And Vomiting   Clopidogrel Other (See Comments)    Reports intolerance and states causes myalgias   Statins Other (See Comments) and Nausea And Vomiting    Joint pains   Oxycodone Nausea And Vomiting   Crestor [Rosuvastatin Calcium] Rash   Zetia [Ezetimibe] Other (See Comments)    Muscle weakness and joint pain    Outpatient Encounter Medications as of 12/06/2021  Medication Sig   acetaminophen (TYLENOL) 500 MG tablet Take 2 tablets (1,000 mg total) by mouth every 6 (six) hours as needed for mild pain or moderate pain.   albuterol (VENTOLIN HFA) 108 (90 Base) MCG/ACT inhaler Inhale 2 puffs into the lungs every 4 (four) hours as  needed for wheezing or shortness of breath.   benzonatate (TESSALON) 100 MG capsule Take 2 capsules (200 mg total) by mouth 2 (two) times daily as needed for cough.   cetirizine (ZYRTEC ALLERGY) 10 MG tablet Take 1 tablet (10 mg total) by mouth daily.   clopidogrel (PLAVIX) 75 MG tablet Take 75 mg by mouth daily.   diclofenac Sodium (VOLTAREN) 1 % GEL Apply 4 grams to the skin 4 times daily. Need appt.   escitalopram (LEXAPRO) 10 MG tablet Take 1 tablet (10 mg total) by mouth daily.   Evolocumab (REPATHA) 140 MG/ML SOSY Inject 140 mg into the skin every 14 (fourteen) days.   fluticasone furoate-vilanterol (BREO ELLIPTA) 100-25 MCG/INH AEPB Inhale 1 puff into the lungs daily.   furosemide (LASIX) 40 MG tablet TAKE ONE TABLET BY MOUTH EVERY MORNING   ketoconazole (NIZORAL) 2 % cream APPLY TO THE AFFECTED AREA(S) of abdominal fold ONCE DAILY AS NEEDED   olmesartan-hydrochlorothiazide (BENICAR HCT) 20-12.5 MG tablet Take 1 tablet by mouth daily   pantoprazole (PROTONIX) 20 MG tablet Take 1 tablet (20 mg total) by mouth daily.   potassium chloride SA (KLOR-CON) 20 MEQ tablet Take 1 tablet (20 mEq total) by mouth 2 (two) times daily.   pregabalin (LYRICA) 100 MG capsule TAKE ONE CAPSULE BY MOUTH EVERY MORNING and TAKE ONE CAPSULE BY MOUTH EVERY EVENING   No facility-administered encounter medications on file as of 12/06/2021.    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 ([place]) 01/11/2019   Iron deficiency anemia 11/15/2018   Endometrial adenocarcinoma ([place]) 67/61/9509   Umbilical hernia without obstruction and without gangrene    Atherosclerosis of right coronary artery 11/10/2017    Atherosclerosis of abdominal aorta ([place]) 11/10/2017   Prediabetes 10/02/2017   Perennial allergic rhinitis with seasonal variation 05/14/2016   Asthma, mild intermittent, well-controlled 05/14/2016   Hypertension goal BP (blood pressure) < 140/90 12/10/2015   Cardiomyopathy due to hypertension ([place]) 12/10/2015   History of CVA (cerebrovascular accident) 12/10/2015   Hyperlipidemia LDL goal <70 12/10/2015   Statin intolerance 12/10/2015    Patient Care Plan: RN Care Management Plan of Care     Problem Identified: Disease Progression (Hypertension)      Long-Range Goal: Disease Progression Prevented or Minimized   Start Date: 12/03/2021  Expected End Date: 03/03/2022  Priority: High  Note:   Current Barriers:  Chronic Disease Management support and education needs related to HTN, HLD, and Asthma   RNCM Clinical Goal(s):  Patient will demonstrate ongoing adherence to prescribed treatment plan for HTN, HLD, and Asthma through collaboration with the provider, RN Care manager and care team.   Interventions: 1:1 collaboration with primary care provider regarding development and update of comprehensive plan of care as evidenced by provider attestation and co-signature Inter-disciplinary care team collaboration (see longitudinal plan of care) Evaluation of current treatment plan related to  [name] management and patient's adherence to plan as established by provider   Asthma:  Reviewed medications and plan for asthma management. Discussed recent symptoms. Reports a productive cough and congestion since the weekend. Described sputum as "thick and yellow." Reports administering Theraflu but symptoms have not resolved. Continues to use inhalers as prescribed. Denies episodes of shortness of breath.  Collaborated with the scheduling staff to coordinate a virtual visit today.  Thoroughly discussed indications and worsening symptoms that require immediate medical attention. Patient verbalized  understanding and agreed to seek care at the Urgent Care or ER if her symptoms worsen or she experiences shortness of breath prior to the virtual outreach later today. Update 12/06/21: Follow up to discuss respiratory symptoms. Reports slight improvement with symptoms. Reports taking decongestants and maintaining adequate hydration as advised. She continues to experience a productive cough. Reports sputum is starting to clear. Describes as "thick and clear." Denies shortness of breath. Reviewed worsening symptoms and indications for seeking immediate medical attention.    Hyperlipidemia Interventions:   Lab Results  Component Value Date   CHOL 196 08/26/2021   HDL 57 08/26/2021   LDLCALC 109 (H) 08/26/2021   TRIG 189 (H) 08/26/2021   CHOLHDL 3.4 08/26/2021    Medication review performed. Advised to continue taking medications as prescribed and notify provider if unable to tolerate regimen. Provider established cholesterol goals reviewed Counseled on importance of regular laboratory monitoring as prescribed Reviewed importance of limiting foods high in cholesterol   Hypertension Interventions:   Last practice recorded BP readings:  BP Readings from Last 3 Encounters:  10/23/21 122/61  10/14/21 124/68  08/26/21 136/70  Most recent eGFR/CrCl: No results found for: EGFR  No components found for: CRCL  Reviewed plan for hypertension management. Reviewed established blood pressure parameters along with indications for notifying a provider. Encouraged to monitor and record readings.  Discussed compliance with recommended cardiac prudent diet. Encouraged to read nutrition labels  and avoid highly processed foods when possible. Reviewed s/sx of heart attack, stroke and worsening symptoms that require immediate medical attention.   Patient Goals/[name]-Care Activities: Take all medications as prescribed Attend all scheduled provider appointments Call pharmacy for medication refills 3-7 days in  advance of running out of medications Continue performing [name] care activities and IADL's independently  Call provider office for new concerns or questions     Follow Up Plan:   Will follow up next month     PLAN A member of the care management team will follow up next month.   Cristy [facility] Care Management [facility] [phone]

## 2021-12-06 NOTE — Patient Instructions (Signed)
Thank you for allowing the Chronic Care Management team to participate in your care.  

## 2021-12-10 ENCOUNTER — Other Ambulatory Visit: Payer: Self-pay | Admitting: Family Medicine

## 2021-12-10 DIAGNOSIS — L304 Erythema intertrigo: Secondary | ICD-10-CM

## 2022-01-03 ENCOUNTER — Telehealth: Payer: Self-pay

## 2022-01-03 DIAGNOSIS — L304 Erythema intertrigo: Secondary | ICD-10-CM

## 2022-01-03 NOTE — Progress Notes (Signed)
Chronic Care Management Pharmacy Assistant   Name: Molly [name]  MRN: [id number] DOB: 04-06-1951  Reason for Encounter: Medication Review/Medication Coordination Call.   Recent office visits:  12/06/2021 Neldon Labella RN (CCM) No medication Changes noted  Recent consult visits:  No recent consult visit  Hospital visits:  None in previous 6 months  Medications: Outpatient Encounter Medications as of 01/03/2022  Medication Sig   acetaminophen (TYLENOL) 500 MG tablet Take 2 tablets (1,000 mg total) by mouth every 6 (six) hours as needed for mild pain or moderate pain.   albuterol (VENTOLIN HFA) 108 (90 Base) MCG/ACT inhaler Inhale 2 puffs into the lungs every 4 (four) hours as needed for wheezing or shortness of breath.   benzonatate (TESSALON) 100 MG capsule Take 2 capsules (200 mg total) by mouth 2 (two) times daily as needed for cough.   cetirizine (ZYRTEC ALLERGY) 10 MG tablet Take 1 tablet (10 mg total) by mouth daily.   clopidogrel (PLAVIX) 75 MG tablet Take 75 mg by mouth daily.   diclofenac Sodium (VOLTAREN) 1 % GEL Apply 4 grams to the skin 4 times daily. Need appt.   escitalopram (LEXAPRO) 10 MG tablet Take 1 tablet (10 mg total) by mouth daily.   Evolocumab (REPATHA) 140 MG/ML SOSY Inject 140 mg into the skin every 14 (fourteen) days.   fluticasone furoate-vilanterol (BREO ELLIPTA) 100-25 MCG/INH AEPB Inhale 1 puff into the lungs daily.   furosemide (LASIX) 40 MG tablet TAKE ONE TABLET BY MOUTH EVERY MORNING   ketoconazole (NIZORAL) 2 % cream APPLY TO THE AFFECTED AREA(S) of abdominal fold ONCE daily AS NEEDED   olmesartan-hydrochlorothiazide (BENICAR HCT) 20-12.5 MG tablet Take 1 tablet by mouth daily   pantoprazole (PROTONIX) 20 MG tablet Take 1 tablet (20 mg total) by mouth daily.   potassium chloride SA (KLOR-CON) 20 MEQ tablet Take 1 tablet (20 mEq total) by mouth 2 (two) times daily.   pregabalin (LYRICA) 100 MG capsule TAKE ONE CAPSULE BY MOUTH EVERY MORNING and  TAKE ONE CAPSULE BY MOUTH EVERY EVENING   No facility-administered encounter medications on file as of 01/03/2022.    Care Gaps: COVID Vaccine Shingrix Vaccine   Star Rating Drugs: Olmesartan-HCTZ 20-12.5 mg last filled on 12/10/2021 for 30 day supply at [facility].   Medication Fill Gaps: None  Reviewed chart for medication changes ahead of medication coordination call.  BP Readings from Last 3 Encounters:  10/23/21 122/61  10/14/21 124/68  08/26/21 136/70    Lab Results  Component Value Date   HGBA1C 6.4 (H) 08/26/2021     Patient obtains medications through Adherence Packaging  30 Days   Last adherence delivery included:  Clopidogrel 75 mg 1 tablet daily-  evening meals Escitalopram 10 mg one tablet daily-  Breakfast Pregabalin 100 mg one Capsule two times daily- Breakfast,Evening meals Olmesartan-HCTZ 20-12.5 mg one tablet daily -Breakfast Ketoconazole 2% cream PRN  Potassium Chloride 20 MEQ 1 tablet  two times daily-Breakfast, evening meals Pantoprazole  20 mg one tablet daily- Breakfast Breo 100-25 MCG/ Inhaler - 1 puff daily  Repatha 140 mg Inject 140 mg into the skin every 14 (fourteen) days  Patient declined medications last month  Levocetirizine 5 mg one tablet daily - evening meals (Adequate supply)  Furosemide 40 mg PRN (Vials, patient states her provider inform her  to take PRN) Coenzyme Q10 one capsule Daily (OTC) Ventolin HFA 108 MCG/ACT -PRN (Has two inhaler on hand as of 11/05/2021)  Patient is due for next  adherence delivery on: 01/15/2022. Called patient and reviewed medications and coordinated delivery.  This delivery to include: Clopidogrel 75 mg 1 tablet daily-  Breakfast Escitalopram 10 mg one tablet daily-  Breakfast Pregabalin 100 mg one Capsule two times daily- Breakfast,Evening meals Olmesartan-HCTZ 20-12.5 mg one tablet daily -Breakfast Ketoconazole 2% cream PRN  Potassium Chloride 20 MEQ 1 tablet  two times daily-Breakfast,  evening meals Pantoprazole  20 mg one tablet daily- Breakfast  Repatha 140 mg Inject 140 mg into the skin every 14 (fourteen) days    Patient declined the following medications Breo 100-25 MCG/ Inhaler - 1 puff daily (Adequate Supply) Levocetirizine 5 mg one tablet daily - evening meals (Adequate supply)  Furosemide 40 mg PRN (Vials, patient states her provider inform her  to take PRN) Coenzyme Q10 one capsule Daily (OTC) Ventolin HFA 108 MCG/ACT -PRN (Has two inhaler on hand as of 11/05/2021)  Patient needs refills for Ketoconazole.  Confirmed delivery date of 01/15/2022 ([facility]), advised patient that pharmacy will contact them the morning of delivery.  Telephone follow up appointment with Care management team member scheduled for : 01/22/2022 at 11:00 pm.  [place] Pharmacist Assistant [phone]

## 2022-01-06 MED ORDER — KETOCONAZOLE 2 % EX CREA: TOPICAL_CREAM | CUTANEOUS | 0 refills | Status: AC

## 2022-01-06 NOTE — Addendum Note (Signed)
Addended by: Daron Offer A on: 01/06/2022 11:25 AM   Modules accepted: Orders

## 2022-01-15 ENCOUNTER — Ambulatory Visit (INDEPENDENT_AMBULATORY_CARE_PROVIDER_SITE_OTHER): Payer: Medicare Other

## 2022-01-15 DIAGNOSIS — J4520 Mild intermittent asthma, uncomplicated: Secondary | ICD-10-CM

## 2022-01-15 DIAGNOSIS — E785 Hyperlipidemia, unspecified: Secondary | ICD-10-CM

## 2022-01-15 DIAGNOSIS — I10 Essential (primary) hypertension: Secondary | ICD-10-CM

## 2022-01-15 NOTE — Patient Instructions (Signed)
Thank you for allowing the Chronic Care Management team to participate in your care. It was great speaking with you today! °

## 2022-01-15 NOTE — Chronic Care Management (AMB) (Signed)
Chronic Care Management   CCM RN Visit Note  2/95/2841 Name: Molly [name] MRN: [id number] DOB: [birth year]  Subjective: Molly [name] is a 71 y.o. year old female who is a primary care patient of Steele Sizer, MD. The care management team was consulted for assistance with disease management and care coordination needs.    Engaged with patient by telephone for follow up visit in response to provider referral for case management and care coordination services.   Consent to Services:  The patient was given information about Chronic Care Management services, agreed to services, and gave verbal consent prior to initiation of services.  Please see initial visit note for detailed documentation.    Assessment: Review of patient past medical history, allergies, medications, health status, including review of consultants reports, laboratory and other test data, was performed as part of comprehensive evaluation and provision of chronic care management services.   SDOH (Social Determinants of Health) assessments and interventions performed: No  CCM Care Plan  Allergies  Allergen Reactions   Ferumoxytol Anaphylaxis   Liraglutide Other (See Comments)    pancreatitis   Nsaids Hives, Rash and Nausea And Vomiting   Clopidogrel Other (See Comments)    Reports intolerance and states causes myalgias   Statins Other (See Comments) and Nausea And Vomiting    Joint pains   Oxycodone Nausea And Vomiting   Crestor [Rosuvastatin Calcium] Rash   Zetia [Ezetimibe] Other (See Comments)    Muscle weakness and joint pain    Outpatient Encounter Medications as of 01/15/2022  Medication Sig   acetaminophen (TYLENOL) 500 MG tablet Take 2 tablets (1,000 mg total) by mouth every 6 (six) hours as needed for mild pain or moderate pain.   albuterol (VENTOLIN HFA) 108 (90 Base) MCG/ACT inhaler Inhale 2 puffs into the lungs every 4 (four) hours as needed for wheezing or shortness of breath.   benzonatate  (TESSALON) 100 MG capsule Take 2 capsules (200 mg total) by mouth 2 (two) times daily as needed for cough.   cetirizine (ZYRTEC ALLERGY) 10 MG tablet Take 1 tablet (10 mg total) by mouth daily.   clopidogrel (PLAVIX) 75 MG tablet Take 75 mg by mouth daily.   diclofenac Sodium (VOLTAREN) 1 % GEL Apply 4 grams to the skin 4 times daily. Need appt.   escitalopram (LEXAPRO) 10 MG tablet Take 1 tablet (10 mg total) by mouth daily.   Evolocumab (REPATHA) 140 MG/ML SOSY Inject 140 mg into the skin every 14 (fourteen) days.   fluticasone furoate-vilanterol (BREO ELLIPTA) 100-25 MCG/INH AEPB Inhale 1 puff into the lungs daily.   furosemide (LASIX) 40 MG tablet TAKE ONE TABLET BY MOUTH EVERY MORNING   ketoconazole (NIZORAL) 2 % cream APPLY TO THE AFFECTED AREA(S) of abdominal fold ONCE daily AS NEEDED   olmesartan-hydrochlorothiazide (BENICAR HCT) 20-12.5 MG tablet Take 1 tablet by mouth daily   pantoprazole (PROTONIX) 20 MG tablet Take 1 tablet (20 mg total) by mouth daily.   potassium chloride SA (KLOR-CON) 20 MEQ tablet Take 1 tablet (20 mEq total) by mouth 2 (two) times daily.   pregabalin (LYRICA) 100 MG capsule TAKE ONE CAPSULE BY MOUTH EVERY MORNING and TAKE ONE CAPSULE BY MOUTH EVERY EVENING   No facility-administered encounter medications on file as of 01/15/2022.    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 ([place]) 01/11/2019   Iron deficiency anemia 11/15/2018   Endometrial adenocarcinoma ([place]) 93/23/5573   Umbilical hernia without obstruction and without gangrene    Atherosclerosis of right coronary artery 11/10/2017   Atherosclerosis of abdominal aorta ([place]) 11/10/2017   Prediabetes  10/02/2017   Perennial allergic rhinitis with seasonal variation 05/14/2016   Asthma, mild intermittent, well-controlled 05/14/2016   Hypertension goal BP (blood pressure) < 140/90 12/10/2015   Cardiomyopathy due to hypertension ([place]) 12/10/2015   History of CVA (cerebrovascular accident) 12/10/2015   Hyperlipidemia LDL goal <70 12/10/2015   Statin intolerance 12/10/2015    Patient Care Plan: RN Care Management Plan of Care     Problem Identified: Asthma, HTN, HLD      Long-Range Goal: Disease Progression Prevented or Minimized   Start Date: 12/03/2021  Expected End Date: 03/03/2022  Priority: High  Note:   Current Barriers:  Chronic Disease Management support and education needs related to HTN, HLD, and Asthma   RNCM Clinical Goal(s):  Patient will demonstrate ongoing adherence to prescribed treatment plan for HTN, HLD, and Asthma through collaboration with the provider, RN Care manager and care team.   Interventions: 1:1 collaboration with primary care provider regarding development and update of comprehensive plan of care as evidenced by provider attestation and co-signature Inter-disciplinary care team collaboration (see longitudinal plan of care) Evaluation of current treatment plan related to  self management and patient's adherence to plan as established by provider   Asthma:  Reviewed medications and plan for asthma management. Reports taking medications as prescribed. She was evaluated last month for unresolved sinus pressure, cough and congestion. Reports symptoms have resolved. Reports doing very well today. Advised to continue using inhaler and allergy medications as prescribed.  Reviewed importance of avoiding sick contacts to decrease risk of respiratory infection.  Reviewed s/sx of respiratory infections along with complications r/t respiratory illnesses. Reviewed indications for seeking medical follow-up.   Hyperlipidemia Interventions:   Lab Results  Component  Value Date   CHOL 196 08/26/2021   HDL 57 08/26/2021   LDLCALC 109 (H) 08/26/2021   TRIG 189 (H) 08/26/2021   CHOLHDL 3.4 08/26/2021    Reviewed medications and plan for hyperlipidemia management. Reports taking Repatha as prescribed. Expressed concerns regarding recent receipt of prefilled syringes. She prefers to use the autoinjector. Will follow up with the CCM Pharmacist to confirm availability for her current dose.  Reviewed provider established cholesterol goals. Discussed importance of regular laboratory monitoring as prescribed. Reviewed importance of limiting highly processed foods and foods high in cholesterol.   Hypertension Interventions:   Last practice recorded BP readings:  BP Readings from Last 3 Encounters:  10/23/21 122/61  10/14/21 124/68  08/26/21 136/70  Most recent eGFR/CrCl: No results found for: EGFR  No components found for: CRCL  Reviewed plan for hypertension management. Reviewed established blood pressure parameters along with indications for notifying a provider. Reports not monitoring her BP d/t not having her monitor. She is currently staying with her daughter. Denies episodes of chest pain, palpitations, dizziness or shortness of breath. Encouraged to monitor her BP when she obtains the monitor and record readings.  Reviewed compliance with recommended cardiac prudent diet. Encouraged to continue monitoring sodium intake, read nutrition labels and avoid highly processed foods when possible. Reviewed s/sx of heart attack, stroke and worsening symptoms that require immediate medical attention.   Patient Goals/Self-Care  Activities: Take all medications as prescribed Attend all scheduled provider appointments Call pharmacy for medication refills 3-7 days in advance of running out of medications Continue performing self care activities and IADL's independently  Call provider office for new concerns or questions     Follow Up Plan:   Will follow up in  two months      PLAN A member of the care management team will follow up within two months.   [facility] Care Management [facility] [phone]

## 2022-01-21 ENCOUNTER — Telehealth: Payer: Self-pay

## 2022-01-21 NOTE — Progress Notes (Signed)
Chronic Care Management  APPOINTMENT REMINDER  Molly [name] was reminded to have all medications, supplements and any blood glucose and blood pressure readings available for review with Junius Argyle, Pharm. D, at her telephone visit on 01/22/2022 at 11:00 am.   Patient Confirm appointment.  [place] Pharmacist Assistant [phone]

## 2022-01-22 ENCOUNTER — Ambulatory Visit: Payer: Medicare Other

## 2022-01-22 DIAGNOSIS — E785 Hyperlipidemia, unspecified: Secondary | ICD-10-CM

## 2022-01-22 DIAGNOSIS — I10 Essential (primary) hypertension: Secondary | ICD-10-CM

## 2022-01-22 NOTE — Progress Notes (Signed)
Chronic Care Management Pharmacy Note  06/27/1600 Name:  SHILO PHILIPSON MRN:  [id number] DOB:  06-12-1951  Summary: Patient presents for CCM follow-up. Recent DEXA scan showed osteopenia.  -Patient is receiving Repatha prefilled syringes and was interested in seeing if she could get the auto-injector pens instead.  -Patient with a few servings of dietary calcium (mainly eggs) throughout the week.    Recommendations/Changes made from today's visit: START Calcium citrate + D3 once daily   Plan: CPP follow-up 6 months    Recommended Problem List Changes:  Add: Osteopenia of multiple sites  Subjective: ANASTASYA JEWELL is an 71 y.o. year old female who is a primary patient of Steele Sizer, MD.  The CCM team was consulted for assistance with disease management and care coordination needs.    Engaged with patient by telephone for follow up visit in response to provider referral for pharmacy case management and/or care coordination services.   Consent to Services:  The patient was given information about Chronic Care Management services, agreed to services, and gave verbal consent prior to initiation of services.  Please see initial visit note for detailed documentation.   Patient Care Team: Steele Sizer, MD as PCP - General (Family Medicine) Kate Sable, MD as PCP - Cardiology (Cardiology) Rubie Maid, MD as Consulting Physician (Obstetrics and Gynecology) Clent Jacks, RN as Registered Nurse Noreene Filbert, MD as Referring Physician (Radiation Oncology) Sindy Guadeloupe, MD as Consulting Physician (Oncology) Mellody Drown, MD as Referring Physician (Obstetrics) Germaine Pomfret, [facility] (Pharmacist) Neldon Labella, RN as Case Manager Sunday Corn, Autumn Hubbard Hartshorn (Neurology)  Recent office visits: 12/03/21: Patient presented to Serafina Royals, FNP for URI. Benzonatate, cetirizine.   08/26/2021 Dr.Sowles MD (PCP) start Contrave 8-90 mg, take one in am for one week,  go up by one pill every week to a max of 2 twice daily    Recent consult visits: 08/19/2021 Dr.Patel MD (Rheumatology) decrease prednisone to 5 MG tablet; Take 1.5 tablets (7.5 mg total) by mouth once daily for 14 days, THEN 1 tablet (5 mg total) once daily for 14 days, THEN 0.5 tablets (2.5 mg total) once daily for 14 days,Return in about 6 weeks 07/30/2021 [name] NP (Oncology) No medication changes noted,,Return in 6 months 06/28/2021 Dr.Anna MD (Gasrtienterology) No medication changes noted 06/17/2021 Dr.Patel MD (Rheumatology) patient reports stopping Zetia, Decrease Prednisone 15 mg/day for two weeks then stay on 10 mg/day,Return in about 5 weeks   03/27/21: Patient presented to Dr. Juanetta Snow (Oncology) for endometrial adenocarcinoma follow-up.  03/20/21: Patient presented to Dr. Melrose Nakayama (Neurology) for follow-up. Patient instructed to resume clopidogrel.  12/17/20: Patient presented to Dr. Garen Lah (Cardiology) for follow-up. Clopdigrel stopped due to myalgias.   Hospital visits: Admitted to the hospital on 07/15/2021 due to Colonoscopy. Discharge date was 07/15/2021. Discharged from [facility].    Objective:  Lab Results  Component Value Date   CREATININE 0.88 07/30/2021   BUN 17 07/30/2021   GFRNONAA >60 07/30/2021   GFRAA 81 11/07/2020   NA 138 07/30/2021   K 3.6 08/26/2021   CALCIUM 9.0 07/30/2021   CO2 28 07/30/2021   GLUCOSE 116 (H) 07/30/2021    Lab Results  Component Value Date/Time   HGBA1C 6.4 (H) 08/26/2021 11:59 AM   HGBA1C 6.2 (H) 04/23/2021 09:20 AM    Last diabetic Eye exam: No results found for: HMDIABEYEEXA  Last diabetic Foot exam: No results found for: HMDIABFOOTEX   Lab Results  Component Value Date  CHOL 196 08/26/2021   HDL 57 08/26/2021   LDLCALC 109 (H) 08/26/2021   TRIG 189 (H) 08/26/2021   CHOLHDL 3.4 08/26/2021    Hepatic Function Latest Ref Rng & Units 07/30/2021 01/29/2021 11/07/2020  Total Protein 6.5 - 8.1 g/dL  7.2 7.4 6.5  Albumin 3.5 - 5.0 g/dL 3.9 4.1 -  AST 15 - 41 U/L _0 ALT 0 - 44 U/L _1 Alk Phosphatase 38 - 126 U/L 67 77 -  Total Bilirubin 0.3 - 1.2 mg/dL 0.4 0.7 0.5    Lab Results  Component Value Date/Time   TSH 1.47 01/23/2017 08:29 AM    CBC Latest Ref Rng & Units 07/30/2021 01/29/2021 07/13/2020  WBC 4.0 - 10.5 K/uL 7.7 5.3 4.7  Hemoglobin 12.0 - 15.0 g/dL 11.8(L) 12.0 11.2(L)  Hematocrit 36.0 - 46.0 % 36.2 35.7(L) 33.7(L)  Platelets 150 - 400 K/uL 330 275 265    Lab Results  Component Value Date/Time   VD25OH 38 01/23/2017 08:29 AM    Clinical ASCVD: Yes  The ASCVD Risk score (Arnett DK, et al., 2019) failed to calculate for the following reasons:   The patient has a prior MI or stroke diagnosis    Depression screen [facility] 2/9 12/03/2021 10/14/2021 08/26/2021  Decreased Interest 0 0 0  Down, Depressed, Hopeless 0 0 0  PHQ - 2 Score 0 0 0  Altered sleeping - 0 0  Tired, decreased energy - 0 0  Change in appetite - 0 0  Feeling bad or failure about yourself  - 0 0  Trouble concentrating - 0 0  Moving slowly or fidgety/restless - 0 0  Suicidal thoughts - 0 0  PHQ-9 Score - 0 0  Difficult doing work/chores - Not difficult at all -  Some recent data might be hidden     Social History   Tobacco Use  Smoking Status Never  Smokeless Tobacco Never  Tobacco Comments   smoking cessation materials not required   BP Readings from Last 3 Encounters:  10/23/21 122/61  10/14/21 124/68  08/26/21 136/70   Pulse Readings from Last 3 Encounters:  10/23/21 67  10/14/21 87  08/26/21 94   Wt Readings from Last 3 Encounters:  10/23/21 239 lb 8 oz (108.6 kg)  10/14/21 241 lb 12.8 oz (109.7 kg)  08/26/21 242 lb (109.8 kg)   BMI Readings from Last 3 Encounters:  10/23/21 48.37 kg/m  10/14/21 48.84 kg/m  08/26/21 48.88 kg/m    Assessment/Interventions: Review of patient past medical history, allergies, medications, health status, including review of  consultants reports, laboratory and other test data, was performed as part of comprehensive evaluation and provision of chronic care management services.   SDOH:  (Social Determinants of Health) assessments and interventions performed: Yes    SDOH Screenings   Alcohol Screen: Low Risk    Last Alcohol Screening Score (AUDIT): 0  Depression (PHQ2-9): Low Risk    PHQ-2 Score: 0  Financial Resource Strain: Low Risk    Difficulty of Paying Living Expenses: Not hard at all  Food Insecurity: No Food Insecurity   Worried About Charity fundraiser in the Last Year: Never true   Ran Out of Food in the Last Year: Never true  Housing: Low Risk    Last Housing Risk Score: 0  Physical Activity: Inactive   Days of Exercise per Week: 0 days   Minutes of Exercise per Session: 0 min  Social Connections: Moderately Integrated  Frequency of Communication with Friends and Family: More than three times a week   Frequency of Social Gatherings with Friends and Family: Three times a week   Attends Religious Services: More than 4 times per year   Active Member of Clubs or Organizations: Yes   Attends Music therapist: More than 4 times per year   Marital Status: Divorced  Stress: No Stress Concern Present   Feeling of Stress : Not at all  Tobacco Use: Low Risk    Smoking Tobacco Use: Never   Smokeless Tobacco Use: Never   Passive Exposure: Not on file  Transportation Needs: No Transportation Needs   Lack of Transportation (Medical): No   Lack of Transportation (Non-Medical): No    CCM Care Plan  Allergies  Allergen Reactions   Ferumoxytol Anaphylaxis   Liraglutide Other (See Comments)    pancreatitis   Nsaids Hives, Rash and Nausea And Vomiting   Clopidogrel Other (See Comments)    Reports intolerance and states causes myalgias   Statins Other (See Comments) and Nausea And Vomiting    Joint pains   Oxycodone Nausea And Vomiting   Crestor [Rosuvastatin Calcium] Rash   Zetia  [Ezetimibe] Other (See Comments)    Muscle weakness and joint pain    Medications Reviewed Today     Reviewed by Neldon Labella, RN (Registered Nurse) on 01/15/22 at 1354  Med List Status: <None>   Medication Order Taking? Sig Documenting Provider Last Dose Status Informant  acetaminophen (TYLENOL) 500 MG tablet [id number] No Take 2 tablets (1,000 mg total) by mouth every 6 (six) hours as needed for mild pain or moderate pain. Rubie Maid, MD Taking Active Self  albuterol (VENTOLIN HFA) 108 (90 Base) MCG/ACT inhaler [id number] No Inhale 2 puffs into the lungs every 4 (four) hours as needed for wheezing or shortness of breath. Steele Sizer, MD Taking Active   benzonatate (TESSALON) 100 MG capsule [id number]  Take 2 capsules (200 mg total) by mouth 2 (two) times daily as needed for cough. Bo Merino, FNP  Active   cetirizine (ZYRTEC ALLERGY) 10 MG tablet [id number]  Take 1 tablet (10 mg total) by mouth daily. Bo Merino, FNP  Active   clopidogrel (PLAVIX) 75 MG tablet [id number] No Take 75 mg by mouth daily. [provider] Taking Active   diclofenac Sodium (VOLTAREN) 1 % GEL [id number] No Apply 4 grams to the skin 4 times daily. Need appt. Steele Sizer, MD Taking Active   escitalopram (LEXAPRO) 10 MG tablet [id number] No Take 1 tablet (10 mg total) by mouth daily. Steele Sizer, MD Taking Active   Evolocumab (REPATHA) 140 MG/ML SOSY [id number] No Inject 140 mg into the skin every 14 (fourteen) days. Steele Sizer, MD Taking Active   fluticasone furoate-vilanterol (BREO ELLIPTA) 100-25 MCG/INH AEPB [id number] No Inhale 1 puff into the lungs daily. Steele Sizer, MD Taking Active   furosemide (LASIX) 40 MG tablet [id number] No TAKE ONE TABLET BY MOUTH EVERY MORNING Sowles, Drue Stager, MD Taking Active   ketoconazole (NIZORAL) 2 % cream [id number]  APPLY TO THE AFFECTED AREA(S) of abdominal fold ONCE daily AS NEEDED Steele Sizer, MD  Active   olmesartan-hydrochlorothiazide  [facility] HCT) 20-12.5 MG tablet [id number] No Take 1 tablet by mouth daily Bo Merino, FNP Taking Active   pantoprazole (PROTONIX) 20 MG tablet [id number] No Take 1 tablet (20 mg total) by mouth daily. Steele Sizer, MD Taking Active   potassium chloride SA (KLOR-CON) 20 MEQ tablet [id number] No Take  1 tablet (20 mEq total) by mouth 2 (two) times daily. 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 ([place]) 01/11/2019   Iron deficiency anemia 11/15/2018   Endometrial adenocarcinoma ([place]) 22/29/7989   Umbilical hernia without obstruction and without gangrene    Atherosclerosis of right coronary artery 11/10/2017   Atherosclerosis of abdominal aorta ([place]) 11/10/2017   Prediabetes 10/02/2017   Perennial allergic rhinitis with seasonal variation 05/14/2016   Asthma, mild intermittent, well-controlled 05/14/2016   Hypertension goal BP (blood pressure) < 140/90 12/10/2015   Cardiomyopathy due to hypertension ([place]) 12/10/2015   History of CVA (cerebrovascular accident) 12/10/2015   Hyperlipidemia LDL goal <70 12/10/2015   Statin intolerance 12/10/2015    Immunization History  Administered Date(s) Administered   Fluad Quad(high Dose 65+) 10/05/2019, 10/15/2020   Influenza, High Dose Seasonal PF 11/11/2016, 10/02/2017, 09/07/2018, 08/26/2021   Pneumococcal Conjugate-13 04/06/2018   Pneumococcal Polysaccharide-23 11/11/2016   Tdap 11/11/2016    Conditions to be  addressed/monitored:  Hypertension, Hyperlipidemia, Coronary Artery Disease, Asthma, Depression and Allergic Rhinitis  Care Plan : General Pharmacy (Adult)  Updates made by Germaine Pomfret, RPH since 01/22/2022 12:00 AM     Problem: Hypertension, Hyperlipidemia, Coronary Artery Disease, Asthma, Depression and Allergic Rhinitis   Priority: High     Long-Range Goal: Patient-Specific Goal   Start Date: 04/03/2021  Expected End Date: 11/01/2022  This Visit's Progress: On track  Recent Progress: On track  Priority: High  Note:   Current Barriers:  Unable to independently monitor therapeutic efficacy Unable to achieve control of Cholesterol   Pharmacist Clinical Goal(s):  Patient will achieve control of blood pressure as evidenced by Home BP less than 140/90 maintain control of cholesterol as evidenced by LDL less than 70  through collaboration with PharmD and provider.   Interventions: 1:1 collaboration with Steele Sizer, MD regarding development and update of comprehensive plan of care as evidenced by provider attestation and co-signature Inter-disciplinary care team collaboration (see longitudinal plan of care) Comprehensive medication review performed; medication list updated in electronic medical record  Hypertension (BP goal <140/90) -Controlled -Current treatment: Furosemide 40 mg daily Olmesartan-HCTZ 20-12.5 mg daily -Medications previously tried: NA  -Current home readings: NA -Denies hypotensive/hypertensive symptoms -Educated on Daily salt intake goal < 2300 mg; Exercise goal of 150 minutes per week; Importance of home blood pressure monitoring; -Counseled to monitor BP at home weekly, document, and provide log at future appointments -Recommended to continue current medication  Hyperlipidemia: (LDL goal < 70) -History of TIA  -Uncontrolled -Current treatment: Repatha 140 mg every 14 days: Appropriate, Effective, Safe, Accessible -Current antiplatelet  treatment: Clopidogrel 75 mg daily (evening): Appropriate, Effective, Safe, Accessible  -Medications previously tried: Statins, Aspirin -Patient is receiving Repatha prefilled syringes and was interested in seeing if she could get the auto-injector pens instead.  -Continue current medications  Asthma (Goal: control symptoms and prevent exacerbations) -Not ideally controlled -Current treatment  Ventolin HFA 2 puffs every 4 hours  as needed Breo 1 puff daily  -Current allergic rhinitis treatment  Cetirizine 10 mg daily -Medications previously tried: NA  -Allergies normally worse in the fall.  -MMRC/CAT score: NA -Pulmonary function testing: NA -Exacerbations requiring treatment in last 6 months: Yes  -Patient denies consistent use of maintenance inhaler; only takes Breo PRN.  -Frequency of rescue inhaler use: 3 times daily  -Counseled on Proper inhaler technique; Benefits of consistent maintenance inhaler use -Recommended to continue current medication  Depression/Anxiety (Goal: Maintain stable mood) -Controlled -Current treatment: Escitalopram 10 mg daily (AM)  -Medications previously tried/failed: NA -PHQ9: 0 -GAD7: 0 -Connected with NA for mental health support -Educated on Benefits of medication for symptom control -Recommended to continue current medication  Osteopenia (Goal Prevent fractures) -Uncontrolled -Last DEXA Scan: 11/18/21   T-Score femoral neck: -1.5  T-Score total hip: -0.7  T-Score lumbar spine: -1.7  T-Score forearm radius: NA  10-year probability of major osteoporotic fracture: 3.5%  10-year probability of hip fracture: 0.5% -Patient is not a candidate for pharmacologic treatment -Current treatment  None -Medications previously tried: None -Patient with a few servings of dietary calcium (mainly eggs) throughout the week.   -Recommend [phone] units of vitamin D daily. Recommend 1200 mg of calcium daily from dietary and supplemental sources. -START  Calcium citrate + D3 once daily    GERD (Goal: Prevent heartburn / reflux) -Controlled -Current treatment  Pantoprazole 20 mg daily (bedtime)  -Medications previously tried: NA  -Avoids pizza at night  -Counseled on trigger avoidance, not laying down after meals -Recommended to continue current medication  Chronic Pain (Goal: Minimize pain and maintain quality of life) -Controlled -Current treatment  Acetaminophen 500 mg 2 tablets  Voltaren 1 % gel  Pregabalin 100 mg twice daily  -Medications previously tried: NA -Counseled to limit acetaminophen use to < 3000 mg daily.   -Recommended to continue current medication   Patient Goals/Self-Care Activities Patient will:  - focus on medication adherence by utilizing [facility] services check blood pressure weekly, document, and provide at future appointments  Follow Up Plan: Telephone follow up appointment with care management team member scheduled for:  07/09/2022 at 11:00 AM        Medication Assistance: None required.  Patient affirms current coverage meets needs.  Patient's preferred pharmacy is:  [facility] - [place], [place] - [address] [place] [address] Phone: [phone] Fax: [phone]  [place], [place] - [address] [place] [address] Phone: [phone] Fax: [phone]  [place], [address] [place] [address] [place] [address] Phone: [phone] Fax: [phone]   Patient decided to: Utilize [facility] for medication synchronization, packaging and delivery  Care Plan and Follow Up Patient Decision:  Patient agrees to Care Plan and Follow-up.  Plan: Telephone follow up appointment with care management team member scheduled for:  07/09/2022 at 11:00 AM  Malva Limes, [place] Pharmacist Practitioner  [facility] [phone]

## 2022-01-22 NOTE — Patient Instructions (Signed)
Visit Information It was great speaking with you today!  Please let me know if you have any questions about our visit.   Goals Addressed             This Visit's Progress    Track and Manage My Symptoms-Asthma   On track    Timeframe:  Long-Range Goal Priority:  High Start Date: 04/03/2021                            Expected End Date:  10/03/2022                     Follow Up within 90 days   - avoid symptom triggers outdoors - eliminate symptom triggers at home - keep rescue medicines on hand  - take long-acting maintenance inhaler every daily    Why is this important?   Keeping track of asthma symptoms can tell you a lot about your asthma control.  Based on symptoms and peak flow results you can see how well you are doing.  Your asthma action plan has a green, yellow and red zone. Green means all is good; it is your goal. Yellow means your symptoms are a little worse. You will need to adjust your medications. Being in the red zone means that your   asthma is out of control. You will need to use your rescue medicines. You may need emergency care.     Notes:         Patient Care Plan: General Pharmacy (Adult)     Problem Identified: Hypertension, Hyperlipidemia, Coronary Artery Disease, Asthma, Depression and Allergic Rhinitis   Priority: High     Long-Range Goal: Patient-Specific Goal   Start Date: 04/03/2021  Expected End Date: 11/01/2022  This Visit's Progress: On track  Recent Progress: On track  Priority: High  Note:   Current Barriers:  Unable to independently monitor therapeutic efficacy Unable to achieve control of Cholesterol   Pharmacist Clinical Goal(s):  Patient will achieve control of blood pressure as evidenced by Home BP less than 140/90 maintain control of cholesterol as evidenced by LDL less than 70  through collaboration with PharmD and provider.   Interventions: 1:1 collaboration with Steele Sizer, MD regarding development and update of  comprehensive plan of care as evidenced by provider attestation and co-signature Inter-disciplinary care team collaboration (see longitudinal plan of care) Comprehensive medication review performed; medication list updated in electronic medical record  Hypertension (BP goal <140/90) -Controlled -Current treatment: Furosemide 40 mg daily Olmesartan-HCTZ 20-12.5 mg daily -Medications previously tried: NA  -Current home readings: NA -Denies hypotensive/hypertensive symptoms -Educated on Daily salt intake goal < 2300 mg; Exercise goal of 150 minutes per week; Importance of home blood pressure monitoring; -Counseled to monitor BP at home weekly, document, and provide log at future appointments -Recommended to continue current medication  Hyperlipidemia: (LDL goal < 70) -History of TIA  -Uncontrolled -Current treatment: Repatha 140 mg every 14 days: Appropriate, Effective, Safe, Accessible -Current antiplatelet treatment: Clopidogrel 75 mg daily (evening): Appropriate, Effective, Safe, Accessible  -Medications previously tried: Statins, Aspirin -Patient is receiving Repatha prefilled syringes and was interested in seeing if she could get the auto-injector pens instead.  -Continue current medications  Asthma (Goal: control symptoms and prevent exacerbations) -Not ideally controlled -Current treatment  Ventolin HFA 2 puffs every 4 hours as needed Breo 1 puff daily  -Current allergic rhinitis treatment  Cetirizine 10 mg daily -Medications previously  tried: NA  -Allergies normally worse in the fall.  -MMRC/CAT score: NA -Pulmonary function testing: NA -Exacerbations requiring treatment in last 6 months: Yes  -Patient denies consistent use of maintenance inhaler; only takes Breo PRN.  -Frequency of rescue inhaler use: 3 times daily  -Counseled on Proper inhaler technique; Benefits of consistent maintenance inhaler use -Recommended to continue current medication  Depression/Anxiety  (Goal: Maintain stable mood) -Controlled -Current treatment: Escitalopram 10 mg daily (AM)  -Medications previously tried/failed: NA -PHQ9: 0 -GAD7: 0 -Connected with NA for mental health support -Educated on Benefits of medication for symptom control -Recommended to continue current medication  Osteopenia (Goal Prevent fractures) -Uncontrolled -Last DEXA Scan: 11/18/21   T-Score femoral neck: -1.5  T-Score total hip: -0.7  T-Score lumbar spine: -1.7  T-Score forearm radius: NA  10-year probability of major osteoporotic fracture: 3.5%  10-year probability of hip fracture: 0.5% -Patient is not a candidate for pharmacologic treatment -Current treatment  None -Medications previously tried: None -Patient with a few servings of dietary calcium (mainly eggs) throughout the week.   -Recommend [phone] units of vitamin D daily. Recommend 1200 mg of calcium daily from dietary and supplemental sources. -START Calcium citrate + D3 once daily    GERD (Goal: Prevent heartburn / reflux) -Controlled -Current treatment  Pantoprazole 20 mg daily (bedtime)  -Medications previously tried: NA  -Avoids pizza at night  -Counseled on trigger avoidance, not laying down after meals -Recommended to continue current medication  Chronic Pain (Goal: Minimize pain and maintain quality of life) -Controlled -Current treatment  Acetaminophen 500 mg 2 tablets  Voltaren 1 % gel  Pregabalin 100 mg twice daily  -Medications previously tried: NA -Counseled to limit acetaminophen use to < 3000 mg daily.   -Recommended to continue current medication   Patient Goals/Self-Care Activities Patient will:  - focus on medication adherence by utilizing [facility] services check blood pressure weekly, document, and provide at future appointments  Follow Up Plan: Telephone follow up appointment with care management team member scheduled for:  07/09/2022 at 11:00 AM    Patient agreed to services  and verbal consent obtained.   Patient verbalizes understanding of instructions and care plan provided today and agrees to view in [place]. Active MyChart status confirmed with patient.    Malva Limes, [place] Pharmacist Practitioner  [facility] [phone]

## 2022-01-28 DIAGNOSIS — I10 Essential (primary) hypertension: Secondary | ICD-10-CM

## 2022-01-28 DIAGNOSIS — E785 Hyperlipidemia, unspecified: Secondary | ICD-10-CM

## 2022-01-28 DIAGNOSIS — J4520 Mild intermittent asthma, uncomplicated: Secondary | ICD-10-CM

## 2022-01-31 ENCOUNTER — Inpatient Hospital Stay: Payer: Medicare Other

## 2022-01-31 ENCOUNTER — Inpatient Hospital Stay: Payer: Medicare Other | Admitting: Oncology

## 2022-02-03 ENCOUNTER — Telehealth: Payer: Self-pay

## 2022-02-03 DIAGNOSIS — I10 Essential (primary) hypertension: Secondary | ICD-10-CM

## 2022-02-03 DIAGNOSIS — Z8673 Personal history of transient ischemic attack (TIA), and cerebral infarction without residual deficits: Secondary | ICD-10-CM

## 2022-02-03 NOTE — Progress Notes (Signed)
Chronic Care Management Pharmacy Assistant   Name: Molly [name]  MRN: [id number] DOB: [birth year]  Reason for Encounter: Medication Review/Medication Coordination Call.   Recent office visits:  No recent office visit  Recent consult visits:  No recent consult visit  Hospital visits:  None in previous 6 months  Medications: Outpatient Encounter Medications as of 02/03/2022  Medication Sig   acetaminophen (TYLENOL) 500 MG tablet Take 2 tablets (1,000 mg total) by mouth every 6 (six) hours as needed for mild pain or moderate pain.   albuterol (VENTOLIN HFA) 108 (90 Base) MCG/ACT inhaler Inhale 2 puffs into the lungs every 4 (four) hours as needed for wheezing or shortness of breath.   benzonatate (TESSALON) 100 MG capsule Take 2 capsules (200 mg total) by mouth 2 (two) times daily as needed for cough.   cetirizine (ZYRTEC ALLERGY) 10 MG tablet Take 1 tablet (10 mg total) by mouth daily.   clopidogrel (PLAVIX) 75 MG tablet Take 75 mg by mouth daily.   diclofenac Sodium (VOLTAREN) 1 % GEL Apply 4 grams to the skin 4 times daily. Need appt.   escitalopram (LEXAPRO) 10 MG tablet Take 1 tablet (10 mg total) by mouth daily.   Evolocumab (REPATHA) 140 MG/ML SOSY Inject 140 mg into the skin every 14 (fourteen) days.   fluticasone furoate-vilanterol (BREO ELLIPTA) 100-25 MCG/INH AEPB Inhale 1 puff into the lungs daily.   furosemide (LASIX) 40 MG tablet TAKE ONE TABLET BY MOUTH EVERY MORNING   ketoconazole (NIZORAL) 2 % cream APPLY TO THE AFFECTED AREA(S) of abdominal fold ONCE daily AS NEEDED   olmesartan-hydrochlorothiazide (BENICAR HCT) 20-12.5 MG tablet Take 1 tablet by mouth daily   pantoprazole (PROTONIX) 20 MG tablet Take 1 tablet (20 mg total) by mouth daily.   potassium chloride SA (KLOR-CON) 20 MEQ tablet Take 1 tablet (20 mEq total) by mouth 2 (two) times daily.   pregabalin (LYRICA) 100 MG capsule TAKE ONE CAPSULE BY MOUTH EVERY MORNING and TAKE ONE CAPSULE BY MOUTH EVERY EVENING    No facility-administered encounter medications on file as of 02/03/2022.    Care Gaps: COVID Vaccine Shingrix Vaccine   Star Rating Drugs: Olmesartan-HCTZ 20-12.5 mg last filled on 01/13/2022 for 30 day supply at [facility].   Medication Fill Gaps: None  Reviewed chart for medication changes ahead of medication coordination call.   BP Readings from Last 3 Encounters:  10/23/21 122/61  10/14/21 124/68  08/26/21 136/70    Lab Results  Component Value Date   HGBA1C 6.4 (H) 08/26/2021     Patient obtains medications through Adherence Packaging  30 Days   Last adherence delivery included:  Clopidogrel 75 mg 1 tablet daily-  Breakfast Escitalopram 10 mg one tablet daily-  Breakfast Pregabalin 100 mg one Capsule two times daily- Breakfast,Evening meals Olmesartan-HCTZ 20-12.5 mg one tablet daily -Breakfast Ketoconazole 2% cream PRN  Potassium Chloride 20 MEQ 1 tablet  two times daily-Breakfast, evening meals Pantoprazole  20 mg one tablet daily- Breakfast  Repatha 140 mg Inject 140 mg into the skin every 14 (fourteen) days  Patient declined medication  last month Breo 100-25 MCG/ Inhaler - 1 puff daily (Adequate Supply) Levocetirizine 5 mg one tablet daily - evening meals (Adequate supply)  Furosemide 40 mg PRN (Vials, patient states her provider inform her  to take PRN) Coenzyme Q10 one capsule Daily (OTC) Ventolin HFA 108 MCG/ACT -PRN (Has two inhaler on hand as of 11/05/2021)  Patient is due for next adherence delivery on:  02/13/2022. Called patient and reviewed medications and coordinated delivery.  This delivery to include: Clopidogrel 75 mg 1 tablet daily-  Breakfast Escitalopram 10 mg one tablet daily-  Breakfast Pregabalin 100 mg one Capsule two times daily- Breakfast,Evening meals Olmesartan-HCTZ 20-12.5 mg one tablet daily -Breakfast Potassium Chloride 20 MEQ 1 tablet  two times daily-Breakfast, evening meals Pantoprazole  20 mg one tablet daily-  Breakfast  Repatha 140 mg Inject 140 mg into the skin every 14 (fourteen) days Calcium/Vitamin D3 315-250 once daily- Breakfast  Patient declined the following medications: Ketoconazole 2% cream PRN  Breo 100-25 MCG/ Inhaler - 1 puff daily (Adequate Supply) Levocetirizine 5 mg one tablet daily - evening meals (Adequate supply)  Furosemide 40 mg PRN (Vials, patient states her provider inform her  to take PRN) Coenzyme Q10 one capsule Daily (OTC) Ventolin HFA 108 MCG/ACT -PRN   Patient needs refills for Olmesartan-HCTZ.  Confirmed delivery date of 02/13/2022 ([facility]), advised patient that pharmacy will contact them the morning of delivery.  Patient states if she is not home to place her delivery on the front porch and not in the garage  at [address] [place] [address].  Patient reports her insurance is going to change next month, March 2023 to [employer] PPO instead of HMO.Patient states she will return my call to inform me of her insurance information on her new card when she receives it.   Patient is asking if the clinical pharmacist figure anything out about the Repatha 140 mg being a auto-injector.Notified clinical pharmacist.  Per Clinical pharmacist,we'll try for a Repatha auto-injector this month and see if it needs a PA.  Patient Verbalized Understanding.  [place] Pharmacist Assistant [phone]

## 2022-02-04 ENCOUNTER — Telehealth: Payer: Self-pay | Admitting: Cardiology

## 2022-02-04 MED ORDER — REPATHA SURECLICK 140 MG/ML ~~LOC~~ SOAJ: 140.0000 mg | SUBCUTANEOUS | 11 refills | Status: DC

## 2022-02-04 NOTE — Addendum Note (Signed)
Addended by: Daron Offer A on: 02/04/2022 04:13 PM   Modules accepted: Orders

## 2022-02-04 NOTE — Telephone Encounter (Signed)
-----   Message from Janan Ridge, [place] sent at 02/04/2022 10:25 AM EST ----- Regarding: Needs appt. Pharmacy is requesting refills for this patient  [facility] - Plavix 75MG  daily.   LS 11/2020  Can you please try to schedule an appt for this patient.

## 2022-02-04 NOTE — Telephone Encounter (Signed)
LVM to schedule

## 2022-02-05 MED ORDER — OLMESARTAN MEDOXOMIL-HCTZ 20-12.5 MG PO TABS: 1.0000 | ORAL_TABLET | Freq: Every day | ORAL | 11 refills | Status: DC

## 2022-02-05 NOTE — Addendum Note (Signed)
Addended by: Daron Offer A on: 02/05/2022 08:30 AM   Modules accepted: Orders

## 2022-02-10 ENCOUNTER — Inpatient Hospital Stay: Payer: Medicare Other | Admitting: Nurse Practitioner

## 2022-02-10 ENCOUNTER — Inpatient Hospital Stay: Payer: Medicare Other

## 2022-02-10 NOTE — Telephone Encounter (Signed)
No ans no vm   °

## 2022-02-17 ENCOUNTER — Inpatient Hospital Stay: Payer: Medicare Other | Attending: Oncology | Admitting: Nurse Practitioner

## 2022-02-17 ENCOUNTER — Other Ambulatory Visit: Payer: Self-pay

## 2022-02-17 ENCOUNTER — Encounter: Payer: Self-pay | Admitting: Nurse Practitioner

## 2022-02-17 ENCOUNTER — Inpatient Hospital Stay: Payer: Medicare Other

## 2022-02-17 VITALS — BP 146/69 | HR 65 | Temp 97.5°F | Resp 18 | Wt 238.6 lb

## 2022-02-17 DIAGNOSIS — G620 Drug-induced polyneuropathy: Secondary | ICD-10-CM | POA: Diagnosis not present

## 2022-02-17 DIAGNOSIS — C541 Malignant neoplasm of endometrium: Secondary | ICD-10-CM

## 2022-02-17 DIAGNOSIS — Z08 Encounter for follow-up examination after completed treatment for malignant neoplasm: Secondary | ICD-10-CM | POA: Diagnosis not present

## 2022-02-17 DIAGNOSIS — T451X5A Adverse effect of antineoplastic and immunosuppressive drugs, initial encounter: Secondary | ICD-10-CM

## 2022-02-17 DIAGNOSIS — Z8542 Personal history of malignant neoplasm of other parts of uterus: Secondary | ICD-10-CM | POA: Insufficient documentation

## 2022-02-17 LAB — COMPREHENSIVE METABOLIC PANEL
ALT: 16 U/L (ref 0–44)
AST: 22 U/L (ref 15–41)
Albumin: 4.1 g/dL (ref 3.5–5.0)
Alkaline Phosphatase: 73 U/L (ref 38–126)
Anion gap: 6 (ref 5–15)
BUN: 18 mg/dL (ref 8–23)
CO2: 26 mmol/L (ref 22–32)
Calcium: 9.3 mg/dL (ref 8.9–10.3)
Chloride: 104 mmol/L (ref 98–111)
Creatinine, Ser: 0.71 mg/dL (ref 0.44–1.00)
GFR, Estimated: >60 mL/min (ref >60)
Glucose, Bld: 107 mg/dL — ABNORMAL HIGH (ref 70–99)
Potassium: 3.8 mmol/L (ref 3.5–5.1)
Sodium: 136 mmol/L (ref 135–145)
Total Bilirubin: 0.2 mg/dL — ABNORMAL LOW (ref 0.3–1.2)
Total Protein: 7.5 g/dL (ref 6.5–8.1)

## 2022-02-17 LAB — CBC WITH DIFFERENTIAL/PLATELET
Abs Immature Granulocytes: 0.02 10*3/uL (ref 0.00–0.07)
Basophils Absolute: 0 10*3/uL (ref 0.0–0.1)
Basophils Relative: 1 %
Eosinophils Absolute: 0 10*3/uL (ref 0.0–0.5)
Eosinophils Relative: 1 %
HCT: 36.7 % (ref 36.0–46.0)
Hemoglobin: 12 g/dL (ref 12.0–15.0)
Immature Granulocytes: 0 %
Lymphocytes Relative: 37 %
Lymphs Abs: 1.8 10*3/uL (ref 0.7–4.0)
MCH: 30.1 pg (ref 26.0–34.0)
MCHC: 32.7 g/dL (ref 30.0–36.0)
MCV: 92 fL (ref 80.0–100.0)
Monocytes Absolute: 0.4 10*3/uL (ref 0.1–1.0)
Monocytes Relative: 8 %
Neutro Abs: 2.7 10*3/uL (ref 1.7–7.7)
Neutrophils Relative %: 53 %
Platelets: 296 10*3/uL (ref 150–400)
RBC: 3.99 MIL/uL (ref 3.87–5.11)
RDW: 13.2 % (ref 11.5–15.5)
WBC: 5 10*3/uL (ref 4.0–10.5)
nRBC: 0 % (ref 0.0–0.2)

## 2022-02-17 NOTE — Progress Notes (Signed)
Pt reports neuropathy in feet, fingers and leg. Worse on left leg/foot compared to right. "Feels like I have socks on." Pt requesting referral for same. Pt c/o right thigh pain.

## 2022-02-17 NOTE — Progress Notes (Unsigned)
Hematology/Oncology Consult Note [facility]  [phone] [phone]  Patient Care Team: Steele Sizer, MD as PCP - General (Family Medicine) Kate Sable, MD as PCP - Cardiology (Cardiology) Rubie Maid, MD as Consulting Physician (Obstetrics and Gynecology) Clent Jacks, RN as Registered Nurse Noreene Filbert, MD as Referring Physician (Radiation Oncology) Sindy Guadeloupe, MD as Consulting Physician (Oncology) Mellody Drown, MD as Referring Physician (Obstetrics) Germaine Pomfret, [facility] (Pharmacist) Neldon Labella, RN as Case Manager [facility] (Neurology)   Name of the patient: Molly [name]  [id number]  Jun 06, 1951   Date of visit: 02/17/22  Diagnosis- invasive adenocarcinoma of the endometrium endometrioid subtype FIGO Stage IIIC2 pT1a pN2M0   Chief complaint/ Reason for visit-routine follow-up of endometrial cancer  Heme/Onc history:  patient is a 71 year old female who follows up with Dr. Marcelline Mates for cystocele and rectocele.  She was noted to have postmenopausal bleeding and cramping recently.  Ultrasound revealed heterogeneous appearance of uterus with fibroids invading the endometrium.  She underwent an endometrial biopsy when she had a second bout of postmenopausal bleeding.  Biopsy showed endometrioid adenocarcinoma FIGO grade 1.  She was then seen by Dr. Fransisca Connors and plan was to proceed with laparoscopic hysterectomy and bilateral salpingo-oophorectomy along with pelvic lymph node sampling   Final pathology showed: Endometrioid carcinoma FIGO grade 2 with 33% myometrial invasion.  Lymphovascular invasion present.  Peritoneal/ascites fluid atypical.  2 out of 3 sentinel lymph nodes were positive for macro metastases.  pT1a pN1a.  FIGO IIIC1   Patient's case was discussed at tumor board and consensus was to proceed with 3 cycles of adjuvant carbotaxol followed by radiation followed by 3 more cycles of  chemotherapy.  Patient is already met with Dr. Donella Stade who will be giving radiation therapy to pelvic and periaortic lymph nodes and boost to her vaginal cuff.  PET CT scan showed metastatic adenopathy and retroperitoneal and external iliac group of lymph nodes in the right side.  No evidence of metastatic disease elsewhere.   First cycle of chemotherapy was given on 11/05/2018.  She completed 6 cycles on 02/25/2019.  She completed whole pelvic radiation in June 2020.    Interval history-   Mrs. Babilonia is a 71 year old female who presents today for follow-up.  She was last seen by Dr. Janese Banks on 01/29/2021 and is followed by Dr. Fransisca Connors last seen on 04/24/2021.  Her most recent pelvic exam was completely normal.  She had a colonoscopy on 07/15/2021 by Dr. Vicente Males which showed 2 polyps that were removed-pathology showed tubular adenoma and was negative for high-grade dysplasia and malignancy.   Reports being on prednisone for about 2 months now due to myalgias/arthralgias secondary to cholesterol medication.  States Dr. Posey Pronto continues to taper her off.  She is currently on 15 mg/day.  Joint pain has significantly improved although now she reports swelling in lower extremities and weight gain.  She continues to complain of persistent peripheral neuropathy due to Taxol with improvement on Lyrica/gabapentin.  Denies any vaginal bleeding or discharge.  Appetite is good and denies any abdominal pain.  Reports weight gain and has questions regarding weight loss medications.  ECOG PS- 1 Pain scale- 0  Review of systems- Review of Systems  Constitutional:  Positive for malaise/fatigue.  Cardiovascular:  Positive for leg swelling.  Neurological:  Positive for sensory change.     Allergies  Allergen Reactions   Ferumoxytol Anaphylaxis   Liraglutide Other (See Comments)    pancreatitis  Nsaids Hives, Rash and Nausea And Vomiting   Clopidogrel Other (See Comments)    Reports intolerance and states causes  myalgias   Statins Other (See Comments) and Nausea And Vomiting    Joint pains   Oxycodone Nausea And Vomiting   Crestor [Rosuvastatin Calcium] Rash   Zetia [Ezetimibe] Other (See Comments)    Muscle weakness and joint pain     Past Medical History:  Diagnosis Date   Anxiety    Asthma    history of asthma   Cardiomyopathy ([place])    Complication of anesthesia    tore hair out and made teeth rough   COVID-19 virus infection 09/08/2019   Depression    Endometrial cancer ([place]) 08/2018   GERD (gastroesophageal reflux disease)    takes prilosec prn   History of CVA (cerebrovascular accident) 12/10/2015   Hyperlipidemia LDL goal <70 12/10/2015   Hypertension    Prediabetes 10/02/2017   A1c 6 in January 2018   Stroke [facility]) 1990    no residual effects     Past Surgical History:  Procedure Laterality Date   ABDOMINAL HYSTERECTOMY     CHOLECYSTECTOMY     COLONOSCOPY WITH PROPOFOL N/A 01/08/2017   Procedure: COLONOSCOPY WITH PROPOFOL;  Surgeon: [name], MD;  Location: [facility] ENDOSCOPY;  Service: Endoscopy;  Laterality: N/A;   COLONOSCOPY WITH PROPOFOL N/A 05/05/2018   Procedure: COLONOSCOPY WITH PROPOFOL;  Surgeon: [name], MD;  Location: [facility] ENDOSCOPY;  Service: Gastroenterology;  Laterality: N/A;   COLONOSCOPY WITH PROPOFOL N/A 07/15/2021   Procedure: COLONOSCOPY WITH PROPOFOL;  Surgeon: [name], MD;  Location: [facility] ENDOSCOPY;  Service: Gastroenterology;  Laterality: N/A;   HERNIA REPAIR  [id number]/[id number]   umbilical   PORTACATH PLACEMENT N/A 11/04/2018   Procedure: INSERTION PORT-A-CATH;  Surgeon: Jules Husbands, MD;  Location: [facility] ORS;  Service: General;  Laterality: N/A;   SENTINEL NODE BIOPSY N/A 10/06/2018   Procedure: SENTINEL NODE BIOPSY;  Surgeon: Mellody Drown, MD;  Location: [facility] ORS;  Service: Gynecology;  Laterality: N/A;   UMBILICAL HERNIA REPAIR N/A 11/17/2017   Procedure: HERNIA REPAIR UMBILICAL ADULT;  Surgeon: Jules Husbands, MD;  Location: [facility] ORS;  Service:  General;  Laterality: N/A;    Social History   Socioeconomic History   Marital status: Divorced    Spouse name: Not on file   Number of children: 2   Years of education: some college   Highest education level: 12th grade  Occupational History   Occupation: Retired    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   Occupation: currently a Theme park manager  Tobacco Use   Smoking status: Never   Smokeless tobacco: Never   Tobacco comments:    smoking cessation materials not required  Vaping Use   Vaping Use: Never used  Substance and Sexual Activity   Alcohol use: No    Alcohol/week: 0.0 standard drinks   Drug use: No   Sexual activity: Not Currently  Other Topics Concern   Not on file  Social History Narrative   Pt lives alone but currently staying with her daughter   Social Determinants of Health   Financial Resource Strain: Low Risk    Difficulty of Paying Living Expenses: Not hard at all  Food Insecurity: No Food Insecurity   Worried About Charity fundraiser in the Last Year: Never true   Arboriculturist in the Last Year: Never true  Transportation Needs: No Transportation Needs   Lack of Transportation (Medical): No  Lack of Transportation (Non-Medical): No  Physical Activity: Inactive   Days of Exercise per Week: 0 days   Minutes of Exercise per Session: 0 min  Stress: No Stress Concern Present   Feeling of Stress : Not at all  Social Connections: Moderately Integrated   Frequency of Communication with Friends and Family: More than three times a week   Frequency of Social Gatherings with Friends and Family: Three times a week   Attends Religious Services: More than 4 times per year   Active Member of Clubs or Organizations: Yes   Attends Music therapist: More than 4 times per year   Marital Status: Divorced  Human resources officer Violence: Not At Risk   Fear of Current or Ex-Partner: No   Emotionally Abused: No   Physically Abused: No   Sexually Abused: No     Family History  Problem Relation Age of Onset   Stroke Mother    Hypertension Mother    Dementia Mother    Alzheimer's disease Mother    Gout Father    Asthma Father    Hypertension Father    Dementia Father    Healthy Sister    Stroke Brother    Alzheimer's disease Brother    Healthy Daughter    Hypertension Brother    Healthy Brother    Cancer Paternal 79    Healthy Sister    Healthy Sister    Hypertension Sister    Hypertension Sister    Stroke Brother    Alzheimer's disease Brother    Stroke Brother    Hypertension Brother    Stroke Brother    Hypertension Brother    Healthy Brother    Healthy Brother    Hypertension Daughter    Breast cancer Neg Hx      Current Outpatient Medications:    acetaminophen (TYLENOL) 500 MG tablet, Take 2 tablets (1,000 mg total) by mouth every 6 (six) hours as needed for mild pain or moderate pain., Disp: 30 tablet, Rfl: 0   albuterol (VENTOLIN HFA) 108 (90 Base) MCG/ACT inhaler, Inhale 2 puffs into the lungs every 4 (four) hours as needed for wheezing or shortness of breath., Disp: 8.5 each, Rfl: 11   benzonatate (TESSALON) 100 MG capsule, Take 2 capsules (200 mg total) by mouth 2 (two) times daily as needed for cough., Disp: 20 capsule, Rfl: 0   Calcium Carb-Cholecalciferol (CALCIUM 1000 + D PO), Take by mouth., Disp: , Rfl:    clopidogrel (PLAVIX) 75 MG tablet, Take 75 mg by mouth daily., Disp: , Rfl:    diclofenac Sodium (VOLTAREN) 1 % GEL, Apply 4 grams to the skin 4 times daily. Need appt., Disp: 300 g, Rfl: 0   escitalopram (LEXAPRO) 10 MG tablet, Take 1 tablet (10 mg total) by mouth daily., Disp: 90 tablet, Rfl: 1   Evolocumab (REPATHA SURECLICK) 601 MG/ML SOAJ, Inject 140 mg into the skin every 14 (fourteen) days., Disp: 2 mL, Rfl: 11   fluticasone furoate-vilanterol (BREO ELLIPTA) 100-25 MCG/INH AEPB, Inhale 1 puff into the lungs daily., Disp: 180 each, Rfl: 1   furosemide (LASIX) 40 MG tablet, TAKE ONE TABLET BY  MOUTH EVERY MORNING, Disp: 90 tablet, Rfl: 0   ketoconazole (NIZORAL) 2 % cream, APPLY TO THE AFFECTED AREA(S) of abdominal fold ONCE daily AS NEEDED, Disp: 60 g, Rfl: 0   olmesartan-hydrochlorothiazide (BENICAR HCT) 20-12.5 MG tablet, Take 1 tablet by mouth daily., Disp: 30 tablet, Rfl: 11   pantoprazole (PROTONIX) 20 MG tablet,  Take 1 tablet (20 mg total) by mouth daily., Disp: 30 tablet, Rfl: 11   potassium chloride SA (KLOR-CON) 20 MEQ tablet, Take 1 tablet (20 mEq total) by mouth 2 (two) times daily., Disp: 180 tablet, Rfl: 1   pregabalin (LYRICA) 100 MG capsule, TAKE ONE CAPSULE BY MOUTH EVERY MORNING and TAKE ONE CAPSULE BY MOUTH EVERY EVENING, Disp: 180 capsule, Rfl: 1   cetirizine (ZYRTEC ALLERGY) 10 MG tablet, Take 1 tablet (10 mg total) by mouth daily. (Patient not taking: Reported on 02/17/2022), Disp: 30 tablet, Rfl: 1  Physical exam:  Physical Exam Constitutional:      Appearance: Normal appearance. She is obese.  HENT:     Head: Normocephalic and atraumatic.  Eyes:     Pupils: Pupils are equal, round, and reactive to light.  Cardiovascular:     Rate and Rhythm: Normal rate and regular rhythm.     Heart sounds: Normal heart sounds. No murmur heard. Pulmonary:     Effort: Pulmonary effort is normal.     Breath sounds: Normal breath sounds. No wheezing.  Abdominal:     General: Bowel sounds are normal. There is no distension.     Palpations: Abdomen is soft.     Tenderness: There is no abdominal tenderness.  Musculoskeletal:        General: Swelling present. Normal range of motion.     Cervical back: Normal range of motion.  Skin:    General: Skin is warm and dry.     Findings: No rash.  Neurological:     Mental Status: She is alert and oriented to person, place, and time.  Psychiatric:        Judgment: Judgment normal.     CMP Latest Ref Rng & Units 02/17/2022  Glucose 70 - 99 mg/dL 107(H)  BUN 8 - 23 mg/dL 18  Creatinine 0.44 - 1.00 mg/dL 0.71  Sodium 135 - 145  mmol/L 136  Potassium 3.5 - 5.1 mmol/L 3.8  Chloride 98 - 111 mmol/L 104  CO2 22 - 32 mmol/L 26  Calcium 8.9 - 10.3 mg/dL 9.3  Total Protein 6.5 - 8.1 g/dL 7.5  Total Bilirubin 0.3 - 1.2 mg/dL 0.2(L)  Alkaline Phos 38 - 126 U/L 73  AST 15 - 41 U/L 22  ALT 0 - 44 U/L 16   CBC Latest Ref Rng & Units 02/17/2022  WBC 4.0 - 10.5 K/uL 5.0  Hemoglobin 12.0 - 15.0 g/dL 12.0  Hematocrit 36.0 - 46.0 % 36.7  Platelets 150 - 400 K/uL 296    No images are attached to the encounter.  No results found.   Assessment and plan- Patient is a 71 y.o. female  with stage IIIc grade 2 endometrial carcinoma with bilateral positive pelvic sentinel lymph node s/p TLH/BSO with pelvic sentinel lymph node biopsies in October 2019 followed by adjuvant chemotherapy and radiation treatment.  She is here for routine follow-up of endometrial cancer.  Clinically she continues to do well with no concerning signs and symptoms of recurrence.  She has been rotating visits with gyn/onc, radiation oncology and medical oncology so that she is evaluated every 3 to 4 months.  She had a CT abdomen/pelvis last on 11/12/2020 which was negative for recurrence.  Her CA125's have been normal.  Today's results are pending.  Chemo induced peripheral neuropathy-stable on Lyrica  Weight gain/swelling-secondary to prednisone.  She is followed by Dr. Posey Pronto for myalgias/arthralgias secondary to polymyalgia rheumatica.  She has stopped her cholesterol medication because she  thought this was contributing.  She is currently on 15 mg prednisone daily and they are working to taper her off.  Patient has questions about an OTC weight loss medication called Golo.    RTC in 6 months with lab work (CBC, CMP and CA125) and to see Dr. Janese Banks.  I spent 20 minutes dedicated to the care of this patient (face-to-face and non-face-to-face) on the date of the encounter to include what is described in the assessment and plan.  Visit Diagnosis No diagnosis  found.  [name], NP 02/17/2022 1:23 PM

## 2022-02-18 LAB — CA 125: Cancer Antigen (CA) 125: 11 U/mL (ref 0.0–38.1)

## 2022-03-03 ENCOUNTER — Other Ambulatory Visit: Payer: Self-pay | Admitting: Family Medicine

## 2022-03-03 DIAGNOSIS — R600 Localized edema: Secondary | ICD-10-CM

## 2022-03-06 ENCOUNTER — Telehealth: Payer: Self-pay

## 2022-03-06 NOTE — Progress Notes (Signed)
? ? ?Chronic Care Management ?Pharmacy Assistant  ? ?Name: Molly [name]  MRN: [id number] DOB: [birth year] ? ?Reason for Encounter: Medication Review/Medication Coordination Call. ?  ?Recent office visits:  ?No recent office visit ? ?Recent consult visits:  ?02/17/2022 Beckey Rutter NP (Oncology) No medication Changes noted, return in 6 months ?02/03/2022 Dr. Posey Pronto MD (Rheumatology) patient reported she stop taking Zetia, no medication changes noted, return in 6 months ? ?Hospital visits:  ?None in previous 6 months ? ?Medications: ?Outpatient Encounter Medications as of 03/06/2022  ?Medication Sig Note  ? acetaminophen (TYLENOL) 500 MG tablet Take 2 tablets (1,000 mg total) by mouth every 6 (six) hours as needed for mild pain or moderate pain.   ? albuterol (VENTOLIN HFA) 108 (90 Base) MCG/ACT inhaler Inhale 2 puffs into the lungs every 4 (four) hours as needed for wheezing or shortness of breath.   ? benzonatate (TESSALON) 100 MG capsule Take 2 capsules (200 mg total) by mouth 2 (two) times daily as needed for cough.   ? Calcium Carb-Cholecalciferol (CALCIUM 1000 + D PO) Take by mouth.   ? cetirizine (ZYRTEC ALLERGY) 10 MG tablet Take 1 tablet (10 mg total) by mouth daily. (Patient not taking: Reported on 02/17/2022)   ? clopidogrel (PLAVIX) 75 MG tablet Take 75 mg by mouth daily.   ? diclofenac Sodium (VOLTAREN) 1 % GEL Apply 4 grams to the skin 4 times daily. Need appt.   ? escitalopram (LEXAPRO) 10 MG tablet Take 1 tablet (10 mg total) by mouth daily.   ? Evolocumab (REPATHA SURECLICK) 809 MG/ML SOAJ Inject 140 mg into the skin every 14 (fourteen) days.   ? fluticasone furoate-vilanterol (BREO ELLIPTA) 100-25 MCG/INH AEPB Inhale 1 puff into the lungs daily.   ? furosemide (LASIX) 40 MG tablet TAKE ONE TABLET BY MOUTH EVERY MORNING 02/17/2022: As needed  ? ketoconazole (NIZORAL) 2 % cream APPLY TO THE AFFECTED AREA(S) of abdominal fold ONCE daily AS NEEDED   ? olmesartan-hydrochlorothiazide (BENICAR HCT) 20-12.5 MG  tablet Take 1 tablet by mouth daily.   ? pantoprazole (PROTONIX) 20 MG tablet Take 1 tablet (20 mg total) by mouth daily.   ? potassium chloride SA (KLOR-CON M) 20 MEQ tablet TAKE ONE TABLET BY MOUTH TWICE DAILY   ? pregabalin (LYRICA) 100 MG capsule TAKE ONE CAPSULE BY MOUTH EVERY MORNING and TAKE ONE CAPSULE BY MOUTH EVERY EVENING   ? ?No facility-administered encounter medications on file as of 03/06/2022.  ? ? ?Care Gaps: ?COVID Vaccine ?Shingrix Vaccine ?  ?Star Rating Drugs: ?Olmesartan-HCTZ 20-12.5 mg last filled on 02/11/2022 for 30 day supply at [facility]. ?  ?Medication Fill Gaps: ?None ? ?Reviewed chart for medication changes ahead of medication coordination call. ? ?BP Readings from Last 3 Encounters:  ?02/17/22 (!) 146/69  ?10/23/21 122/61  ?10/14/21 124/68  ?  ?Lab Results  ?Component Value Date  ? HGBA1C 6.4 (H) 08/26/2021  ?  ? ?Patient obtains medications through Adherence Packaging  30 Days  ? ?Last adherence delivery included:  ?Clopidogrel 75 mg 1 tablet daily-  Breakfast ?Escitalopram 10 mg one tablet daily-  Breakfast ?Pregabalin 100 mg one Capsule two times daily- Breakfast,Evening meals ?Olmesartan-HCTZ 20-12.5 mg one tablet daily -Breakfast ?Potassium Chloride 20 MEQ 1 tablet  two times daily-Breakfast, evening meals ?Pantoprazole  20 mg one tablet daily- Breakfast ? Repatha 140 mg Inject 140 mg into the skin every 14 (fourteen) days ?Calcium/Vitamin D3 315-250 once daily- Breakfast ? ?Patient declined medications last month: ?Ketoconazole 2% cream  PRN  ?Breo 100-25 MCG/ Inhaler - 1 puff daily (Adequate Supply) ?Levocetirizine 5 mg one tablet daily - evening meals (Adequate supply)  ?Furosemide 40 mg PRN (Vials, patient states her provider inform her  to take PRN) ?Coenzyme Q10 one capsule Daily (OTC) ?Ventolin HFA 108 MCG/ACT -PRN  ?  ?Patient is due for next adherence delivery on: 03/17/2022. ?Called patient and reviewed medications and coordinated delivery. ? ?This delivery to  include: ?Clopidogrel 75 mg 1 tablet daily-  Breakfast - patient needs appointment with her Cardiologist for further refills.Patient states she is going to call her cardiologist to schedule appointment and request refills. ? ?Escitalopram 10 mg one tablet daily-  Breakfast ? ?Pregabalin 100 mg one Capsule two times daily- Breakfast,Evening meals - Patient is requesting her Pregabalin to be package with the rest of her medications instead of it being by itself in a single package. ? ?Olmesartan-HCTZ 20-12.5 mg one tablet daily -Breakfast ? ?Potassium Chloride 20 MEQ 1 tablet  two times daily-Breakfast, evening meals ? ?Pantoprazole  20 mg one tablet daily- Breakfast ? ? Repatha 140 mg Inject 140 mg into the skin every 14 (fourteen) days - Patient states she did not get the auto injector  last month delivery, and would like to know if that can be fix this month. Patient states she was informed she was approved to get Repatha auto injector.Notified [facility]. ? ?Calcium/Vitamin D3 315-250 once daily- Breakfast ? ? ?Patient declined the following medications: ?Ketoconazole 2% cream PRN  ?Breo 100-25 MCG/ Inhaler - 1 puff daily (Adequate Supply) ?Levocetirizine 5 mg one tablet daily - evening meals (Adequate supply)  ?Furosemide 40 mg PRN (Vials, patient states her provider inform her  to take PRN) ?Coenzyme Q10 one capsule Daily (OTC) ?Ventolin HFA 108 MCG/ACT -PRN  ? ?Patient needs refills for Clopidogrel (patient needs a visit with cardiologist for further refills. Patient is aware and agreed to schedule appointment). ? ?Confirmed delivery date of 03/17/2022, advised patient that pharmacy will contact them the morning of delivery. ? ?Telephone follow up appointment with Care management team member scheduled for : 07/09/2022 at 11:00 am. ? ?Anderson Malta ?Clinical Pharmacist Assistant ?[phone]  ? ?

## 2022-03-14 ENCOUNTER — Telehealth: Payer: Self-pay | Admitting: *Deleted

## 2022-03-14 ENCOUNTER — Inpatient Hospital Stay: Payer: Medicaid Other | Admitting: Internal Medicine

## 2022-03-14 NOTE — Telephone Encounter (Signed)
Patient called. She accidentally missed her apt this morning with Dr. Mickeal Skinner. She is not able to r/s until 4/7 due to other obligations within her church/community. I offered her an apt sooner than 4/7 at [facility] if she desired. However, she is not willing to drive to [place] to see Dr. Mickeal Skinner at this time as she lives in [place]. Apt r/s for 4/7 per her request. Apt to manage her peripheral neuropathy. She thanked me for calling her back. ?

## 2022-03-19 ENCOUNTER — Encounter: Payer: Self-pay | Admitting: Family Medicine

## 2022-03-19 ENCOUNTER — Ambulatory Visit (INDEPENDENT_AMBULATORY_CARE_PROVIDER_SITE_OTHER): Payer: 59 | Admitting: Family Medicine

## 2022-03-19 ENCOUNTER — Other Ambulatory Visit: Payer: Self-pay

## 2022-03-19 DIAGNOSIS — J069 Acute upper respiratory infection, unspecified: Secondary | ICD-10-CM | POA: Diagnosis not present

## 2022-03-19 DIAGNOSIS — J4521 Mild intermittent asthma with (acute) exacerbation: Secondary | ICD-10-CM | POA: Diagnosis not present

## 2022-03-19 MED ORDER — FLUTICASONE FUROATE-VILANTEROL 100-25 MCG/ACT IN AEPB: 1.0000 | INHALATION_SPRAY | Freq: Every day | RESPIRATORY_TRACT | 11 refills | Status: DC

## 2022-03-19 MED ORDER — PREDNISONE 20 MG PO TABS: 40.0000 mg | ORAL_TABLET | Freq: Every day | ORAL | 0 refills | Status: AC

## 2022-03-19 NOTE — Progress Notes (Signed)
Virtual Visit via Telephone Note ? ?I connected with Molly [name] on [birth year] at  9:40 AM EDT by telephone and verified that I am speaking with the correct person using two identifiers. ? ?Location: ?Patient: home ?Provider: [facility] ?  ?I discussed the limitations, risks, security and privacy concerns of performing an evaluation and management service by telephone and the availability of in person appointments. I also discussed with the patient that there may be a patient responsible charge related to this service. The patient expressed understanding and agreed to proceed. ? ? ?History of Present Illness: ? ?UPPER RESPIRATORY TRACT INFECTION ?- h/o asthma, on breo.  ?- taking albuterol every 4 hours.  ?- symptom onset 3 days ago ? ?Fever: no ?Cough: yes ?Shortness of breath: no ?Wheezing: yes ?Chest pain: no ?Chest tightness: no ?Chest congestion: yes ?Nasal congestion: yes ?Runny nose: no ?Sore throat: yes, better ?Headache: no ?Ear pain: no  ?Ear pressure: no  ?Vomiting:  once after taking breo ?Sick contacts: no ?Relief with OTC cold/cough medications: no  ?Treatments attempted: breo, albuterol, theraflu  ? ?  ?Observations/Objective: ? ?Patient had trouble connecting to video visit, entirety of visit conducted over the phone. ? ?Speaks in full sentences, no respiratory distress. ? ? ?Assessment and Plan: ? ?Viral URI, asthma exacerbation ?Rx steroid burst. Breo refilled. Will test for COVID, patient will call if home test positive, would be a candidate for COVID treatment if positive. Reviewed OTC symptom relief, self-quarantine guidelines, and emergency precautions.  ? ? ?  ?I discussed the assessment and treatment plan with the patient. The patient was provided an opportunity to ask questions and all were answered. The patient agreed with the plan and demonstrated an understanding of the instructions. ?  ?The patient was advised to call back or seek an in-person evaluation if the symptoms worsen or if the  condition fails to improve as anticipated. ? ?I provided 8 minutes of non-face-to-face time during this encounter. ? ? ?Myles Gip, DO ?

## 2022-04-02 ENCOUNTER — Telehealth: Payer: Self-pay

## 2022-04-02 NOTE — Progress Notes (Signed)
? ? ?Chronic Care Management ?Pharmacy Assistant  ? ?Name: Molly [name]  MRN: [id number] DOB: Jun 10, 1951 ? ?Reason for Encounter: Medication Review/Medication Coordination Call. ?  ?Recent office visits:  ?03/19/2022 Rory Percy DO (PCP Office) Start Prednisone 40 mg daily ? ?Recent consult visits:  ?No recent consult visit ? ?Hospital visits:  ?None in previous 6 months ? ?Medications: ?Outpatient Encounter Medications as of 04/02/2022  ?Medication Sig Note  ? acetaminophen (TYLENOL) 500 MG tablet Take 2 tablets (1,000 mg total) by mouth every 6 (six) hours as needed for mild pain or moderate pain.   ? albuterol (VENTOLIN HFA) 108 (90 Base) MCG/ACT inhaler Inhale 2 puffs into the lungs every 4 (four) hours as needed for wheezing or shortness of breath.   ? benzonatate (TESSALON) 100 MG capsule Take 2 capsules (200 mg total) by mouth 2 (two) times daily as needed for cough. (Patient not taking: Reported on 03/19/2022)   ? Calcium Carb-Cholecalciferol (CALCIUM 1000 + D PO) Take by mouth.   ? cetirizine (ZYRTEC ALLERGY) 10 MG tablet Take 1 tablet (10 mg total) by mouth daily. (Patient not taking: Reported on 02/17/2022)   ? clopidogrel (PLAVIX) 75 MG tablet Take 75 mg by mouth daily.   ? diclofenac Sodium (VOLTAREN) 1 % GEL Apply 4 grams to the skin 4 times daily. Need appt.   ? escitalopram (LEXAPRO) 10 MG tablet Take 1 tablet (10 mg total) by mouth daily.   ? Evolocumab (REPATHA SURECLICK) 962 MG/ML SOAJ Inject 140 mg into the skin every 14 (fourteen) days.   ? fluticasone furoate-vilanterol (BREO ELLIPTA) 100-25 MCG/ACT AEPB Inhale 1 puff into the lungs daily.   ? furosemide (LASIX) 40 MG tablet TAKE ONE TABLET BY MOUTH EVERY MORNING 02/17/2022: As needed  ? ketoconazole (NIZORAL) 2 % cream APPLY TO THE AFFECTED AREA(S) of abdominal fold ONCE daily AS NEEDED   ? olmesartan-hydrochlorothiazide (BENICAR HCT) 20-12.5 MG tablet Take 1 tablet by mouth daily.   ? pantoprazole (PROTONIX) 20 MG tablet Take 1 tablet (20 mg  total) by mouth daily.   ? potassium chloride SA (KLOR-CON M) 20 MEQ tablet TAKE ONE TABLET BY MOUTH TWICE DAILY   ? pregabalin (LYRICA) 100 MG capsule TAKE ONE CAPSULE BY MOUTH EVERY MORNING and TAKE ONE CAPSULE BY MOUTH EVERY EVENING   ? ?No facility-administered encounter medications on file as of 04/02/2022.  ? ? ?Care Gaps: ?COVID Vaccine ?Shingrix Vaccine ?  ?Star Rating Drugs: ?Olmesartan-HCTZ 20-12.5 mg last filled on 03/11/2022 for 30 day supply at [facility]. ?  ?Medication Fill Gaps: ?None ? ?Reviewed chart for medication changes ahead of medication coordination call. ? ?BP Readings from Last 3 Encounters:  ?02/17/22 (!) 146/69  ?10/23/21 122/61  ?10/14/21 124/68  ?  ?Lab Results  ?Component Value Date  ? HGBA1C 6.4 (H) 08/26/2021  ?  ? ?Patient obtains medications through Adherence Packaging  30 Days  ? ?Last adherence delivery included: ?  ?Clopidogrel 75 mg 1 tablet daily-  Breakfast - patient needs appointment with her Cardiologist for further refills.Patient states she is going to call her cardiologist to schedule appointment and request refills. ?  ?Escitalopram 10 mg one tablet daily-  Breakfast ?  ?Pregabalin 100 mg one Capsule two times daily- Breakfast,Evening meals - Patient is requesting her Pregabalin to be package with the rest of her medications instead of it being by itself in a single package. ?  ?Olmesartan-HCTZ 20-12.5 mg one tablet daily -Breakfast ?  ?Potassium Chloride 20 MEQ 1 tablet  two times daily-Breakfast, evening meals ?  ?Pantoprazole  20 mg one tablet daily- Breakfast ?  ? Repatha 140 mg Inject 140 mg into the skin every 14 (fourteen) days - Patient states she did not get the auto injector  last month delivery, and would like to know if that can be fix this month. Patient states she was informed she was approved to get Repatha auto injector.Notified [facility] pharmacy. ?  ?Calcium/Vitamin D3 315-250 once daily- Breakfast ?  ? ?Patient declined medications last  month ?Ketoconazole 2% cream PRN  ?Breo 100-25 MCG/ Inhaler - 1 puff daily (Adequate Supply) ?Levocetirizine 5 mg one tablet daily - evening meals (Adequate supply)  ?Furosemide 40 mg PRN (Vials, patient states her provider inform her  to take PRN) ?Coenzyme Q10 one capsule Daily (OTC) ?Ventolin HFA 108 MCG/ACT -PRN  ? ?Patient is due for next adherence delivery on: 04/15/2022. ?Called patient and reviewed medications and coordinated delivery. ? ?This delivery to include: ?Escitalopram 10 mg one tablet daily-  Breakfast ?  ?Pregabalin 100 mg one Capsule two times daily- Breakfast,Evening meals  ? ?Olmesartan-HCTZ 20-12.5 mg one tablet daily -Breakfast ? ?Potassium Chloride 20 MEQ 1 tablet  two times daily-Breakfast, evening meals ? ?Pantoprazole  20 mg one tablet daily- Breakfast ?  ? Repatha 140 mg Inject 140 mg into the skin every 14 (fourteen) days ? ?Calcium/Vitamin D3 315-250 once daily- Breakfast ? ?Ventolin HFA 108 MCG/ACT -PRN  ? ?Patient declined the following medications ?Ketoconazole 2% cream PRN  ? ?Breo 100-25 MCG/ Inhaler - 1 puff daily (Adequate Supply) ? ?Levocetirizine 5 mg one tablet daily - evening meals (Adequate supply) ?  ?Furosemide 40 mg PRN (Vials, patient states her provider inform her  to take PRN) ? ?Coenzyme Q10 one capsule Daily (OTC) ? ?Patient needs refills for None ID. ? ?Confirmed delivery date of 04/15/2022 ([facility]), advised patient that pharmacy will contact them the morning of delivery. ? ?Anderson Malta ?Clinical Pharmacist Assistant ?[phone]  ? ?

## 2022-04-04 ENCOUNTER — Inpatient Hospital Stay: Payer: 59 | Attending: Internal Medicine | Admitting: Internal Medicine

## 2022-04-04 ENCOUNTER — Encounter: Payer: Self-pay | Admitting: Internal Medicine

## 2022-04-04 VITALS — BP 137/83 | HR 72 | Temp 95.4°F | Ht 59.0 in | Wt 229.4 lb

## 2022-04-04 DIAGNOSIS — Z9221 Personal history of antineoplastic chemotherapy: Secondary | ICD-10-CM | POA: Insufficient documentation

## 2022-04-04 DIAGNOSIS — Z8542 Personal history of malignant neoplasm of other parts of uterus: Secondary | ICD-10-CM | POA: Insufficient documentation

## 2022-04-04 DIAGNOSIS — G620 Drug-induced polyneuropathy: Secondary | ICD-10-CM | POA: Insufficient documentation

## 2022-04-04 DIAGNOSIS — Z79899 Other long term (current) drug therapy: Secondary | ICD-10-CM | POA: Insufficient documentation

## 2022-04-04 DIAGNOSIS — T451X5A Adverse effect of antineoplastic and immunosuppressive drugs, initial encounter: Secondary | ICD-10-CM | POA: Insufficient documentation

## 2022-04-04 NOTE — Progress Notes (Signed)
? ?Molly [name] at [address]  ?[place], [place] [address] ?[phone] ? ? ?New Patient Evaluation ? ?Date of Service: 04/04/22 ?Patient Name: Molly [name] ?Patient MRN: [id number] ?Patient DOB: 20-Feb-1951 ?Provider: Ventura Sellers, MD ? ?Identifying Statement:  ?Molly [name] is a 71 y.o. female with Peripheral neuropathy due to chemotherapy [facility]) who presents for initial consultation and evaluation regarding cancer associated neurologic deficits.   ? ?Referring Provider: ?Steele Sizer, MD ?[address] ?[address] ?[place],  [place] [address] ? ?Primary Cancer: ? ?Oncologic History: ?Oncology History  ?Endometrial adenocarcinoma ([place])  ?10/28/2018 Cancer Staging  ? Staging form: Corpus Uteri - Carcinoma and Carcinosarcoma, AJCC 8th Edition ?- Clinical stage from 10/28/2018: FIGO Stage IIIC1 (cT1a, cN1a, cM0) - Signed by Sindy Guadeloupe, MD on 10/29/2018 ? ?  ?10/29/2018 Initial Diagnosis  ? Endometrial adenocarcinoma ([place]) ?  ?11/05/2018 - 02/25/2019 Chemotherapy  ? The patient had dexamethasone (DECADRON) 4 MG tablet, 8 mg, Oral, Daily, 1 of 1 cycle, Start date: 10/29/2018, End date: 04/18/2019 ?palonosetron (ALOXI) injection 0.25 mg, 0.25 mg, Intravenous,  Once, 6 of 6 cycles ?Administration: 0.25 mg (11/05/2018), 0.25 mg (11/29/2018), 0.25 mg (12/20/2018), 0.25 mg (01/14/2019), 0.25 mg (02/04/2019), 0.25 mg (02/25/2019) ?pegfilgrastim (NEULASTA ONPRO KIT) injection 6 mg, 6 mg, Subcutaneous, Once, 2 of 2 cycles ?Administration: 6 mg (11/05/2018), 6 mg (11/29/2018) ?CARBOplatin (PARAPLATIN) 540 mg in sodium chloride 0.9 % 250 mL chemo infusion, 540 mg (100 % of original dose 540 mg), Intravenous,  Once, 6 of 6 cycles ?Dose modification:   (original dose 540 mg, Cycle 1) ?Administration: 540 mg (11/05/2018), 540 mg (11/29/2018), 540 mg (12/20/2018), 540 mg (01/14/2019), 540 mg (02/04/2019), 540 mg (02/25/2019) ?PACLitaxel (TAXOL) 348 mg in sodium chloride 0.9 % 500 mL chemo infusion (>  49m/m2), 175 mg/m2 = 348 mg, Intravenous,  Once, 6 of 6 cycles ?Dose modification: 150 mg/m2 (original dose 175 mg/m2, Cycle 4, Reason: Other (see comments), Comment: neuropathy) ?Administration: 348 mg (11/05/2018), 348 mg (11/29/2018), 348 mg (12/20/2018), 300 mg (01/14/2019), 300 mg (02/04/2019), 300 mg (02/25/2019) ? ? for chemotherapy treatment.  ? ?  ? ? ?History of Present Illness: ?The patient's records from the referring physician were obtained and reviewed and the patient interviewed to confirm this HPI.  Molly [name].  She describes pain, burning, tingling, numbness, "funny feelings" affecting her feet and hands since chemotherapy in 2020 for endometrial cancer.  [name] have responded best to Lyrica, which she currently doses at 1011mtwice per day.  Currently pain is very well controlled but numbness [name] are persistent. ? ?Medications: ?Current Outpatient Medications on File Prior to Visit  ?Medication Sig Dispense Refill  ? acetaminophen (TYLENOL) 500 MG tablet Take 2 tablets (1,000 mg total) by mouth every 6 (six) hours as needed for mild pain or moderate pain. 30 tablet 0  ? albuterol (VENTOLIN HFA) 108 (90 Base) MCG/ACT inhaler Inhale 2 puffs into the lungs every 4 (four) hours as needed for wheezing or shortness of breath. 8.5 each 11  ? Calcium Carb-Cholecalciferol (CALCIUM 1000 + D PO) Take by mouth.    ? clopidogrel (PLAVIX) 75 MG tablet Take 75 mg by mouth daily.    ? diclofenac Sodium (VOLTAREN) 1 % GEL Apply 4 grams to the skin 4 times daily. Need appt. 300 g 0  ? escitalopram (LEXAPRO) 10 MG tablet Take 1 tablet (10 mg total) by mouth daily. 90 tablet 1  ? Evolocumab (  REPATHA SURECLICK) 053 MG/ML SOAJ Inject 140 mg into the skin every 14 (fourteen) days. 2 mL 11  ? fluticasone furoate-vilanterol (BREO ELLIPTA) 100-25 MCG/ACT AEPB Inhale 1 puff into the lungs daily. 1 each 11  ? furosemide (LASIX) 40 MG tablet TAKE ONE TABLET BY MOUTH EVERY  MORNING 90 tablet 0  ? ketoconazole (NIZORAL) 2 % cream APPLY TO THE AFFECTED AREA(S) of abdominal fold ONCE daily AS NEEDED 60 g 0  ? olmesartan-hydrochlorothiazide (BENICAR HCT) 20-12.5 MG tablet Take 1 tablet by mouth daily. 30 tablet 11  ? pantoprazole (PROTONIX) 20 MG tablet Take 1 tablet (20 mg total) by mouth daily. 30 tablet 11  ? potassium chloride SA (KLOR-CON M) 20 MEQ tablet TAKE ONE TABLET BY MOUTH TWICE DAILY 180 tablet 0  ? pregabalin (LYRICA) 100 MG capsule TAKE ONE CAPSULE BY MOUTH EVERY MORNING and TAKE ONE CAPSULE BY MOUTH EVERY EVENING 180 capsule 1  ? benzonatate (TESSALON) 100 MG capsule Take 2 capsules (200 mg total) by mouth 2 (two) times daily as needed for cough. (Patient not taking: Reported on 03/19/2022) 20 capsule 0  ? cetirizine (ZYRTEC ALLERGY) 10 MG tablet Take 1 tablet (10 mg total) by mouth daily. (Patient not taking: Reported on 02/17/2022) 30 tablet 1  ? ?No current facility-administered medications on file prior to visit.  ? ? ?Allergies:  ?Allergies  ?Allergen Reactions  ? Ferumoxytol Anaphylaxis  ? Liraglutide Other (See Comments)  ?  pancreatitis  ? Nsaids Hives, Rash and Nausea And Vomiting  ? Clopidogrel Other (See Comments)  ?  Reports intolerance and states causes myalgias  ? Statins Other (See Comments) and Nausea And Vomiting  ?  Joint pains  ? Oxycodone Nausea And Vomiting  ? Crestor [Rosuvastatin Calcium] Rash  ? Zetia [Ezetimibe] Other (See Comments)  ?  Muscle weakness and joint pain  ? ?Past Medical History:  ?Past Medical History:  ?Diagnosis Date  ? Anxiety   ? Asthma   ? history of asthma  ? Cardiomyopathy ([place])   ? Complication of anesthesia   ? tore hair out and made teeth rough  ? COVID-19 virus infection 09/08/2019  ? Depression   ? Endometrial cancer ([place]) 08/2018  ? GERD (gastroesophageal reflux disease)   ? takes prilosec prn  ? History of CVA (cerebrovascular accident) 12/10/2015  ? Hyperlipidemia LDL goal <70 12/10/2015  ? Hypertension   ? Prediabetes  10/02/2017  ? A1c 6 in January 2018  ? Stroke [facility]) 1990  ?  no residual effects  ? ?Past Surgical History:  ?Past Surgical History:  ?Procedure Laterality Date  ? ABDOMINAL HYSTERECTOMY    ? CHOLECYSTECTOMY    ? COLONOSCOPY WITH PROPOFOL N/A 01/08/2017  ? Procedure: COLONOSCOPY WITH PROPOFOL;  Surgeon: [name], MD;  Location: [facility] ENDOSCOPY;  Service: Endoscopy;  Laterality: N/A;  ? COLONOSCOPY WITH PROPOFOL N/A 05/05/2018  ? Procedure: COLONOSCOPY WITH PROPOFOL;  Surgeon: [name], MD;  Location[facility] ENDOSCOPY;  Service: Gastroenterology;  Laterality: N/A;  ? COLONOSCOPY WITH PROPOFOL N/A 07/15/2021  ? Procedure: COLONOSCOPY WITH PROPOFOL;  Surgeon: [name], MD;  Location: [facility] ENDOSCOPY;  Service: Gastroenterology;  Laterality: N/A;  ? HERNIA REPAIR  [id number]  ? umbilical  ? PORTACATH PLACEMENT N/A 11/04/2018  ? Procedure: INSERTION PORT-A-CATH;  Surgeon: Jules Husbands, MD;  Location: [facility] ORS;  Service: General;  Laterality: N/A;  ? SENTINEL NODE BIOPSY N/A 10/06/2018  ? Procedure: SENTINEL NODE BIOPSY;  Surgeon: Mellody Drown, MD;  Location: [facility] ORS;  Service: Gynecology;  Laterality: N/A;  ? UMBILICAL HERNIA REPAIR N/A 11/17/2017  ? Procedure: HERNIA REPAIR UMBILICAL ADULT;  Surgeon: Jules Husbands, MD;  Location: [facility] ORS;  Service: General;  Laterality: N/A;  ? ?Social History:  ?Social History  ? ?Socioeconomic History  ? Marital status: Divorced  ?  Spouse name: Not on file  ? Number of children: 2  ? Years of education: some college  ? Highest education level: 12th grade  ?Occupational History  ? Occupation: Retired  ?  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 Occupation: currently a Theme park manager  ?Tobacco Use  ? Smoking status: Never  ? Smokeless tobacco: Never  ? Tobacco comments:  ?  smoking cessation materials not required  ?Vaping Use  ? Vaping Use: Never used  ?Substance and Sexual Activity  ? Alcohol use: No  ?  Alcohol/week: 0.0 standard drinks  ? Drug use: No  ? Sexual activity: Not Currently   ?Other Topics Concern  ? Not on file  ?Social History Narrative  ? Pt lives alone but currently staying with her daughter  ? ?Social Determinants of Health  ? ?Financial Resource Strain: Low Risk   ? Difficulty of

## 2022-04-04 NOTE — Progress Notes (Signed)
C/o "neuropathy". Feels like she has socks on her feet.  ?

## 2022-04-15 NOTE — Progress Notes (Signed)
Name: Molly [name]   MRN: [id number]    DOB: [birth year]   Date:04/16/2022 ? ?     Progress Note ? ?Subjective ? ?Chief Complaint ? ?Follow Up ? ?HPI ? ?Asthma: mild persistent: she is on daily Breo prn  and states no longer having daily cough, wheezing or SOB. She had a bronchitis/asthma flare a few weeks ago but is feeling well now  ? ?Major Depression: currently in remission doing better emotionally with Lexapro. 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  ?  ?Cardiomyopathy without CHF: lower extremity edema but mild and stable, she denies orthopnea, PND  or SOB  ?  ?Endometrial cancer: s/p hysterectomy, finished the chemo and radiation therapy with Dr. Baruch Gouty , no longer has a port, she had a CT pelvis 10/2020 and negative for recurrence of cancer. She is up to date with visits with Dr. Janese Banks and Donella Stade, also sees GYN and last CA 125 was 11 . She has neuropathy from chemotherapy, she continues to have intermittent pain on her toe. She is up to date with follow up with oncologist  ? ?Morbid obesity: off victoza because of episode of pancreatitis,. She gained from 213 lbs to 236 lbs after treatment for endometrial cancer, weight was in the 240 lbs for months but is now 228 lbs, she states she has stopped eating fried food and sodas for one month because of a church fast and now she is eating fried food again and some sodas but not as frequently.   ? ?Dyslipidemia:  She has history of TIA and CAD, we finally her started on Repatha June 2022 since unable to tolerate statin therapy and Zetia was not getting LDL below 70 , last LDL , two months after starting Repatha was down from 130 to 109 but goal is below 70 , she states currently only using Repatha once a month, explained she needs to take it every two weeks.  ? ?HTN: bp today  is at goal, no chest pain, dizziness, or palpitation. Doing well on current medications and denies side effects  ? ?Atherosclerosis aorta:  she is only on Repatha June 2022 last LDL was not  at goal but at the time she was only taking once injection monthly but is now taking it twice month and we will recheck levels  ? ?GERD: symptoms are well controlled, she has been off medication for months , currently on prn medication  ? ?PMR: she was seen by Dr. Ky Barban back in Feb 2022 , had negative ANA and RF, CRP and sed rate were very high for acute onset of pain on both shoulders with difficulty abduction and had to use other hand to assist abduction of right shoulder. When she saw me in April 22 levels were still high and pain was still present we repeated SED rate and CRP and since still positive we referred her to Dr. Posey Pronto ( Rheumatologist) she was formally diagnosed with PMR and was given prednisone for months and finally weaned off 08/2021,  she states generalized body aches has improved  ? ?Achilles tendinitis: she states it was constant pain but now doing some stretching in mornings and it has helped with symptoms advised to add ice packs  ? ?Patient Active Problem List  ? Diagnosis Date Noted  ? Polymyalgia rheumatica syndrome ([place]) 08/26/2021  ? Major depression in remission ([place]) 04/23/2021  ? Morbid obesity ([place]) 04/23/2021  ? Peripheral neuropathy due to chemotherapy ([place]) 04/23/2021  ? Myalgia 03/20/2021  ? 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 Iron deficiency anemia 11/15/2018  ? Endometrial adenocarcinoma ([place]) 10/29/2018  ? Umbilical hernia without obstruction and without gangrene   ? Atherosclerosis of right coronary artery 11/10/2017  ? Atherosclerosis of abdominal aorta ([place]) 11/10/2017  ? Prediabetes 10/02/2017  ? Perennial allergic rhinitis with seasonal variation 05/14/2016  ? Asthma, mild intermittent, well-controlled 05/14/2016  ? Hypertension goal BP (blood pressure) < 140/90 12/10/2015  ? Cardiomyopathy due to hypertension ([place]) 12/10/2015  ? History of CVA (cerebrovascular accident) 12/10/2015  ? Hyperlipidemia LDL goal  <70 12/10/2015  ? Statin intolerance 12/10/2015  ? ? ?Past Surgical History:  ?Procedure Laterality Date  ? ABDOMINAL HYSTERECTOMY    ? CHOLECYSTECTOMY    ? COLONOSCOPY WITH PROPOFOL N/A 01/08/2017  ? Procedure: COLONOSCOPY WITH PROPOFOL;  Surgeon: [name], MD;  Location: [facility] ENDOSCOPY;  Service: Endoscopy;  Laterality: N/A;  ? COLONOSCOPY WITH PROPOFOL N/A 05/05/2018  ? Procedure: COLONOSCOPY WITH PROPOFOL;  Surgeon: [name], MD;  Location: [facility] ENDOSCOPY;  Service: Gastroenterology;  Laterality: N/A;  ? COLONOSCOPY WITH PROPOFOL N/A 07/15/2021  ? Procedure: COLONOSCOPY WITH PROPOFOL;  Surgeon: [name], MD;  Location: [facility] ENDOSCOPY;  Service: Gastroenterology;  Laterality: N/A;  ? HERNIA REPAIR  17/6160  ? umbilical  ? PORTACATH PLACEMENT N/A 11/04/2018  ? Procedure: INSERTION PORT-A-CATH;  Surgeon: Jules Husbands, MD;  Location: [facility] ORS;  Service: General;  Laterality: N/A;  ? SENTINEL NODE BIOPSY N/A 10/06/2018  ? Procedure: SENTINEL NODE BIOPSY;  Surgeon: Mellody Drown, MD;  Location: [facility] ORS;  Service: Gynecology;  Laterality: N/A;  ? UMBILICAL HERNIA REPAIR N/A 11/17/2017  ? Procedure: HERNIA REPAIR UMBILICAL ADULT;  Surgeon: Jules Husbands, MD;  Location: [facility] ORS;  Service: General;  Laterality: N/A;  ? ? ?Family History  ?Problem Relation Age of Onset  ? Stroke Mother   ? Hypertension Mother   ? Dementia Mother   ? Alzheimer's disease Mother   ? Gout Father   ? Asthma Father   ? Hypertension Father   ? Dementia Father   ? Healthy Sister   ? Stroke Brother   ? Alzheimer's disease Brother   ? Healthy Daughter   ? Hypertension Brother   ? Healthy Brother   ? Cancer Paternal Grandmother   ? Healthy Sister   ? Healthy Sister   ? Hypertension Sister   ? Hypertension Sister   ? Stroke Brother   ? Alzheimer's disease Brother   ? Stroke Brother   ? Hypertension Brother   ? Stroke Brother   ? Hypertension Brother   ? Healthy Brother   ? Healthy Brother   ? Hypertension Daughter   ? Breast cancer Neg Hx    ? ? ?Social History  ? ?Tobacco Use  ? Smoking status: Never  ? Smokeless tobacco: Never  ? Tobacco comments:  ?  smoking cessation materials not required  ?Substance Use Topics  ? Alcohol use: No  ?  Alcohol/week: 0.0 standard drinks  ? ? ? ?Current Outpatient Medications:  ?  acetaminophen (TYLENOL) 500 MG tablet, Take 2 tablets (1,000 mg total) by mouth every 6 (six) hours as needed for mild pain or moderate pain., Disp: 30 tablet, Rfl: 0 ?  albuterol (VENTOLIN HFA) 108 (90 Base) MCG/ACT inhaler, Inhale 2 puffs into the lungs every 4 (four) hours as needed for wheezing or shortness of breath., Disp: 8.5 each, Rfl: 11 ?  benzonatate (TESSALON) 100 MG capsule, Take 2  capsules (200 mg total) by mouth 2 (two) times daily as needed for cough., Disp: 20 capsule, Rfl: 0 ?  Calcium Carb-Cholecalciferol (CALCIUM 1000 + D PO), Take by mouth., Disp: , Rfl:  ?  clopidogrel (PLAVIX) 75 MG tablet, Take 75 mg by mouth daily., Disp: , Rfl:  ?  diclofenac Sodium (VOLTAREN) 1 % GEL, Apply 4 grams to the skin 4 times daily. Need appt., Disp: 300 g, Rfl: 0 ?  escitalopram (LEXAPRO) 10 MG tablet, Take 1 tablet (10 mg total) by mouth daily., Disp: 90 tablet, Rfl: 1 ?  Evolocumab (REPATHA SURECLICK) 121 MG/ML SOAJ, Inject 140 mg into the skin every 14 (fourteen) days., Disp: 2 mL, Rfl: 11 ?  fluticasone furoate-vilanterol (BREO ELLIPTA) 100-25 MCG/ACT AEPB, Inhale 1 puff into the lungs daily., Disp: 1 each, Rfl: 11 ?  furosemide (LASIX) 40 MG tablet, TAKE ONE TABLET BY MOUTH EVERY MORNING, Disp: 90 tablet, Rfl: 0 ?  ketoconazole (NIZORAL) 2 % cream, APPLY TO THE AFFECTED AREA(S) of abdominal fold ONCE daily AS NEEDED, Disp: 60 g, Rfl: 0 ?  olmesartan-hydrochlorothiazide (BENICAR HCT) 20-12.5 MG tablet, Take 1 tablet by mouth daily., Disp: 30 tablet, Rfl: 11 ?  pantoprazole (PROTONIX) 20 MG tablet, Take 1 tablet (20 mg total) by mouth daily., Disp: 30 tablet, Rfl: 11 ?  potassium chloride SA (KLOR-CON M) 20 MEQ tablet, TAKE ONE TABLET  BY MOUTH TWICE DAILY, Disp: 180 tablet, Rfl: 0 ?  pregabalin (LYRICA) 100 MG capsule, TAKE ONE CAPSULE BY MOUTH EVERY MORNING and TAKE ONE CAPSULE BY MOUTH EVERY EVENING, Disp: 180 capsule, Rfl: 1 ?

## 2022-04-16 ENCOUNTER — Encounter: Payer: Self-pay | Admitting: Family Medicine

## 2022-04-16 ENCOUNTER — Ambulatory Visit (INDEPENDENT_AMBULATORY_CARE_PROVIDER_SITE_OTHER): Payer: 59 | Admitting: Family Medicine

## 2022-04-16 DIAGNOSIS — I10 Essential (primary) hypertension: Secondary | ICD-10-CM

## 2022-04-16 DIAGNOSIS — G620 Drug-induced polyneuropathy: Secondary | ICD-10-CM

## 2022-04-16 DIAGNOSIS — M7661 Achilles tendinitis, right leg: Secondary | ICD-10-CM

## 2022-04-16 DIAGNOSIS — T451X5A Adverse effect of antineoplastic and immunosuppressive drugs, initial encounter: Secondary | ICD-10-CM

## 2022-04-16 DIAGNOSIS — M170 Bilateral primary osteoarthritis of knee: Secondary | ICD-10-CM

## 2022-04-16 DIAGNOSIS — M353 Polymyalgia rheumatica: Secondary | ICD-10-CM | POA: Diagnosis not present

## 2022-04-16 DIAGNOSIS — I119 Hypertensive heart disease without heart failure: Secondary | ICD-10-CM

## 2022-04-16 DIAGNOSIS — I43 Cardiomyopathy in diseases classified elsewhere: Secondary | ICD-10-CM

## 2022-04-16 DIAGNOSIS — Z8673 Personal history of transient ischemic attack (TIA), and cerebral infarction without residual deficits: Secondary | ICD-10-CM

## 2022-04-16 DIAGNOSIS — J4520 Mild intermittent asthma, uncomplicated: Secondary | ICD-10-CM

## 2022-04-16 DIAGNOSIS — I700 Atherosclerosis of aorta: Secondary | ICD-10-CM | POA: Diagnosis not present

## 2022-04-16 DIAGNOSIS — F325 Major depressive disorder, single episode, in full remission: Secondary | ICD-10-CM

## 2022-04-16 DIAGNOSIS — R739 Hyperglycemia, unspecified: Secondary | ICD-10-CM

## 2022-04-16 MED ORDER — ESCITALOPRAM OXALATE 10 MG PO TABS: 10.0000 mg | ORAL_TABLET | Freq: Every day | ORAL | 5 refills | Status: DC

## 2022-04-16 MED ORDER — PREGABALIN 100 MG PO CAPS: 100.0000 mg | ORAL_CAPSULE | Freq: Two times a day (BID) | ORAL | 5 refills | Status: DC

## 2022-04-17 LAB — LIPID PANEL
Cholesterol: 196 mg/dL (ref <200)
HDL: 68 mg/dL (ref >=50)
LDL Cholesterol (Calc): 105 mg/dL (calc) — ABNORMAL HIGH
Non-HDL Cholesterol (Calc): 128 mg/dL (calc) (ref <130)
Total CHOL/HDL Ratio: 2.9 (calc) (ref <5.0)
Triglycerides: 136 mg/dL (ref <150)

## 2022-04-17 LAB — HEMOGLOBIN A1C
Hgb A1c MFr Bld: 6.2 % of total Hgb — ABNORMAL HIGH (ref <5.7)
Mean Plasma Glucose: 131 mg/dL
eAG (mmol/L): 7.3 mmol/L

## 2022-04-21 ENCOUNTER — Ambulatory Visit (INDEPENDENT_AMBULATORY_CARE_PROVIDER_SITE_OTHER): Payer: 59

## 2022-04-21 ENCOUNTER — Telehealth: Payer: Self-pay

## 2022-04-21 DIAGNOSIS — J4520 Mild intermittent asthma, uncomplicated: Secondary | ICD-10-CM

## 2022-04-21 DIAGNOSIS — I10 Essential (primary) hypertension: Secondary | ICD-10-CM

## 2022-04-21 DIAGNOSIS — R058 Other specified cough: Secondary | ICD-10-CM

## 2022-04-21 NOTE — Chronic Care Management (AMB) (Addendum)
Chronic Care Management   CCM RN Visit Note  8/75/6433 Name: Molly [name] MRN: [id number] DOB: [birth year]  Subjective: Molly [name] is a 71 y.o. year old female who is a primary care patient of Steele Sizer, MD. The care management team was consulted for assistance with disease management and care coordination needs.    Engaged with patient by telephone for follow up visit in response to provider referral for case management and care coordination services.   Consent to Services:  The patient was given information about Chronic Care Management services, agreed to services, and gave verbal consent prior to initiation of services.  Please see initial visit note for detailed documentation.    Assessment: Review of patient past medical history, allergies, medications, health status, including review of consultants reports, laboratory and other test data, was performed as part of comprehensive evaluation and provision of chronic care management services.   SDOH (Social Determinants of Health) assessments and interventions performed: No  CCM Care Plan  Allergies  Allergen Reactions   Ferumoxytol Anaphylaxis   Liraglutide Other (See Comments)    pancreatitis   Nsaids Hives, Rash and Nausea And Vomiting   Clopidogrel Other (See Comments)    Reports intolerance and states causes myalgias   Statins Other (See Comments) and Nausea And Vomiting    Joint pains   Oxycodone Nausea And Vomiting   Crestor [Rosuvastatin Calcium] Rash   Zetia [Ezetimibe] Other (See Comments)    Muscle weakness and joint pain    Outpatient Encounter Medications as of 04/21/2022  Medication Sig Note   acetaminophen (TYLENOL) 500 MG tablet Take 2 tablets (1,000 mg total) by mouth every 6 (six) hours as needed for mild pain or moderate pain.    albuterol (VENTOLIN HFA) 108 (90 Base) MCG/ACT inhaler Inhale 2 puffs into the lungs every 4 (four) hours as needed for wheezing or shortness of breath.    Calcium  Carb-Cholecalciferol (CALCIUM 1000 + D PO) Take by mouth.    cetirizine (ZYRTEC ALLERGY) 10 MG tablet Take 1 tablet (10 mg total) by mouth daily. (Patient not taking: Reported on 04/16/2022)    clopidogrel (PLAVIX) 75 MG tablet Take 75 mg by mouth daily.    diclofenac Sodium (VOLTAREN) 1 % GEL Apply 4 grams to the skin 4 times daily. Need appt.    escitalopram (LEXAPRO) 10 MG tablet Take 1 tablet (10 mg total) by mouth daily.    Evolocumab (REPATHA SURECLICK) 109 MG/ML SOAJ Inject 140 mg into the skin every 14 (fourteen) days.    fluticasone furoate-vilanterol (BREO ELLIPTA) 100-25 MCG/ACT AEPB Inhale 1 puff into the lungs daily.    furosemide (LASIX) 40 MG tablet TAKE ONE TABLET BY MOUTH EVERY MORNING 02/17/2022: As needed   ketoconazole (NIZORAL) 2 % cream APPLY TO THE AFFECTED AREA(S) of abdominal fold ONCE daily AS NEEDED    olmesartan-hydrochlorothiazide (BENICAR HCT) 20-12.5 MG tablet Take 1 tablet by mouth daily.    pantoprazole (PROTONIX) 20 MG tablet Take 1 tablet (20 mg total) by mouth daily.    potassium chloride SA (KLOR-CON M) 20 MEQ tablet TAKE ONE TABLET BY MOUTH TWICE DAILY    pregabalin (LYRICA) 100 MG capsule Take 1 capsule (100 mg total) by mouth 2 (two) times daily.    No facility-administered encounter medications on file as of 04/21/2022.    Patient Active Problem List   Diagnosis Date Noted   Polymyalgia rheumatica syndrome ([place]) 08/26/2021   Major depression in remission ([place]) 04/23/2021   Morbid obesity (  [place]) 04/23/2021   Peripheral neuropathy due to chemotherapy ([place]) 04/23/2021   Myalgia 03/20/2021   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 ([place]) 01/11/2019   Iron deficiency anemia 11/15/2018   Endometrial adenocarcinoma ([place]) 36/14/4315   Umbilical hernia without obstruction and without gangrene    Atherosclerosis of right coronary artery 11/10/2017   Atherosclerosis of abdominal aorta ([place]) 11/10/2017   Prediabetes  10/02/2017   Perennial allergic rhinitis with seasonal variation 05/14/2016   Asthma, mild intermittent, well-controlled 05/14/2016   Hypertension goal BP (blood pressure) < 140/90 12/10/2015   Cardiomyopathy due to hypertension ([place]) 12/10/2015   History of CVA (cerebrovascular accident) 12/10/2015   Hyperlipidemia LDL goal <70 12/10/2015   Statin intolerance 12/10/2015      Patient Care Plan: RN Care Management Plan of Care     Problem Identified: Asthma, HTN, HLD      Long-Range Goal: Disease Progression Prevented or Minimized   Start Date: 04/21/2022  Expected End Date: 07/20/2022  Priority: High  Note:   Current Barriers:  Chronic Disease Management support and education needs related to HTN, HLD, and Asthma   RNCM Clinical Goal(s):  Patient will demonstrate ongoing adherence to prescribed treatment plan for HTN, HLD, and Asthma through collaboration with the provider, RN Care manager and care team.   Interventions: 1:1 collaboration with primary care provider regarding development and update of comprehensive plan of care as evidenced by provider attestation and co-signature Inter-disciplinary care team collaboration (see longitudinal plan of care) Evaluation of current treatment plan related to  self management and patient's adherence to plan as established by provider   Asthma Interventions:  Reviewed medications and plan for asthma management.  Reports compliance with treatment plan however she has experienced increased symptoms. Report spraying perfume without realizing the nozzle was pointed towards her face. Reports the incident triggered a coughing and asthma flare. Reports taking Breo as advised. Reports experiencing a dry cough until yesterday. She notes the cough has become productive. Also notes currently experiencing congestion in addition to intermittent episodes of wheezing. Denies shortness of breath. Reports she is able to perform self-care and tasks without  difficulty. Denies changes or decline in activity tolerance since the incident. She is able to speak in full sentences and declined need for acute evaluation. She is agreeable to completing a virtual visit with a clinic provider. Collaborated with clinic scheduling team. Virtual visit scheduled for 04/23/22 Thorough discussion regarding worsening s/sx that require immediate medical attention.  She was evaluated last month for unresolved sinus pressure, cough and congestion. Reports symptoms have resolved. Reports doing very well today. Advised to continue using inhaler and allergy medications as prescribed.  Reviewed importance of avoiding sick contacts to decrease risk of respiratory infection.  Reviewed s/sx of respiratory infections along with complications r/t respiratory illnesses. Reviewed indications for seeking medical follow-up.  Hyperlipidemia Interventions:   Reviewed medications and plan for hyperlipidemia management. Reviewed provider established cholesterol goals. Discussed importance of regular laboratory monitoring as prescribed. Reviewed importance of limiting highly processed foods and foods high in cholesterol.   Hypertension Interventions:   Reviewed plan for hypertension management. Reviewed established blood pressure parameters. Advised to monitor a few times a week if unable to monitor daily and record readings. Reviewed indications for notifying a provider. Reviewed compliance with recommended cardiac prudent diet. Encouraged to continue monitoring sodium intake, read nutrition labels and avoid highly processed foods when possible. Reviewed s/sx of heart attack, stroke and worsening symptoms that require immediate  medical attention.  Patient Goals/Self-Care Activities: Take all medications as prescribed Attend all scheduled provider appointments Call pharmacy for medication refills 3-7 days in advance of running out of medications Continue performing self care  activities and IADL's independently  Call provider office for new concerns or questions          PLAN: A member of the care management team will follow up next month.  [facility] [place] [phone]

## 2022-04-21 NOTE — Patient Instructions (Addendum)
Thank you for allowing the Chronic Care Management team to participate in your care. It was great speaking with you today! °

## 2022-04-21 NOTE — Telephone Encounter (Signed)
Copied from [id number]. Topic: General - Inquiry >> Apr 21, 2022 10:08 AM Robina Ade, Helene Kelp D wrote: Reason for CRM: Patient daughter Drema Balzarine called and had patient beside her and they request a call back from Dr. Ancil Boozer CMA. She said that when she needs anything she was told to always ask for [facility] because she is her to go person. Please call patient back, thanks.

## 2022-04-22 NOTE — Progress Notes (Signed)
? ?Name: Molly [name]   MRN: [id number]    DOB: Oct 22, 1951   Date:04/23/2022 ? ?     Progress Note ? ?Subjective ? ?Chief Complaint ? ?Wheezing/Cough ? ?I connected with  TIMBERLYN PICKFORD  on 62/70/35 at  2:40 PM EDT by a video enabled telemedicine application and verified that I am speaking with the correct person using two identifiers.  I discussed the limitations of evaluation and management by telemedicine and the availability of in person appointments. The patient expressed understanding and agreed to proceed with the virtual visit  Staff also discussed with the patient that there may be a patient responsible charge related to this service. ?Patient Location: at home  ?Provider Location: [place] ?Additional Individuals present: alone  ? ?HPI ? ?Asthma with acute exacerbation: she applied perfume before going to church on Sunday and spray towards her face, took a big inhalation and developed cough right away, it got worse the following day with wheezing and a cough, currently cough is productive but clear sputum and clear rhinorrhea. She states only mild SOB. She states she has been using Breo and Albuterol and it has helped with symptoms. She states worse symptoms are at night - severe cough. She has been taking mucinex. No fever or chills.  ? ?Patient Active Problem List  ? Diagnosis Date Noted  ? Polymyalgia rheumatica syndrome ([place]) 08/26/2021  ? Major depression in remission ([place]) 04/23/2021  ? Morbid obesity ([place]) 04/23/2021  ? Peripheral neuropathy due to chemotherapy ([place]) 04/23/2021  ? Myalgia 03/20/2021  ? 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 Iron deficiency anemia 11/15/2018  ? Endometrial adenocarcinoma ([place]) 10/29/2018  ? Umbilical hernia without obstruction and without gangrene   ? Atherosclerosis of right coronary artery 11/10/2017  ? Atherosclerosis of abdominal aorta ([place]) 11/10/2017  ? Prediabetes 10/02/2017  ? Perennial allergic  rhinitis with seasonal variation 05/14/2016  ? Asthma, mild intermittent, well-controlled 05/14/2016  ? Hypertension goal BP (blood pressure) < 140/90 12/10/2015  ? Cardiomyopathy due to hypertension ([place]) 12/10/2015  ? History of CVA (cerebrovascular accident) 12/10/2015  ? Hyperlipidemia LDL goal <70 12/10/2015  ? Statin intolerance 12/10/2015  ? ? ?Past Surgical History:  ?Procedure Laterality Date  ? ABDOMINAL HYSTERECTOMY    ? CHOLECYSTECTOMY    ? COLONOSCOPY WITH PROPOFOL N/A 01/08/2017  ? Procedure: COLONOSCOPY WITH PROPOFOL;  Surgeon: [name], MD;  Location: [facility] ENDOSCOPY;  Service: Endoscopy;  Laterality: N/A;  ? COLONOSCOPY WITH PROPOFOL N/A 05/05/2018  ? Procedure: COLONOSCOPY WITH PROPOFOL;  Surgeon: [name], MD;  Location: [facility] ENDOSCOPY;  Service: Gastroenterology;  Laterality: N/A;  ? COLONOSCOPY WITH PROPOFOL N/A 07/15/2021  ? Procedure: COLONOSCOPY WITH PROPOFOL;  Surgeon: [name], MD;  Location: [facility] ENDOSCOPY;  Service: Gastroenterology;  Laterality: N/A;  ? HERNIA REPAIR  [id number]  ? umbilical  ? PORTACATH PLACEMENT N/A 11/04/2018  ? Procedure: INSERTION PORT-A-CATH;  Surgeon: Jules Husbands, MD;  Location: [facility] ORS;  Service: General;  Laterality: N/A;  ? SENTINEL NODE BIOPSY N/A 10/06/2018  ? Procedure: SENTINEL NODE BIOPSY;  Surgeon: Mellody Drown, MD;  Location: [facility] ORS;  Service: Gynecology;  Laterality: N/A;  ? UMBILICAL HERNIA REPAIR N/A 11/17/2017  ? Procedure: HERNIA REPAIR UMBILICAL ADULT;  Surgeon: Jules Husbands, MD;  Location: [facility] ORS;  Service: General;  Laterality: N/A;  ? ? ?Family History  ?Problem Relation Age of Onset  ? Stroke Mother   ? Hypertension Mother   ? Dementia Mother   ?  Alzheimer's disease Mother   ? Gout Father   ? Asthma Father   ? Hypertension Father   ? Dementia Father   ? Healthy Sister   ? Stroke Brother   ? Alzheimer's disease Brother   ? Healthy Daughter   ? Hypertension Brother   ? Healthy Brother   ? Cancer Paternal Grandmother   ? Healthy Sister    ? Healthy Sister   ? Hypertension Sister   ? Hypertension Sister   ? Stroke Brother   ? Alzheimer's disease Brother   ? Stroke Brother   ? Hypertension Brother   ? Stroke Brother   ? Hypertension Brother   ? Healthy Brother   ? Healthy Brother   ? Hypertension Daughter   ? Breast cancer Neg Hx   ? ? ?Social History  ? ?Socioeconomic History  ? Marital status: Divorced  ?  Spouse name: Not on file  ? Number of children: 2  ? Years of education: some college  ? Highest education level: 12th grade  ?Occupational History  ? Occupation: Retired  ?  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 Occupation: currently a Theme park manager  ?Tobacco Use  ? Smoking status: Never  ? Smokeless tobacco: Never  ? Tobacco comments:  ?  smoking cessation materials not required  ?Vaping Use  ? Vaping Use: Never used  ?Substance and Sexual Activity  ? Alcohol use: No  ?  Alcohol/week: 0.0 standard drinks  ? Drug use: No  ? Sexual activity: Not Currently  ?Other Topics Concern  ? Not on file  ?Social History Narrative  ? Pt lives alone but currently staying with her daughter  ? ?Social Determinants of Health  ? ?Financial Resource Strain: Low Risk   ? Difficulty of Paying Living Expenses: Not hard at all  ?Food Insecurity: No Food Insecurity  ? Worried About Charity fundraiser in the Last Year: Never true  ? Ran Out of Food in the Last Year: Never true  ?Transportation Needs: No Transportation Needs  ? Lack of Transportation (Medical): No  ? Lack of Transportation (Non-Medical): No  ?Physical Activity: Inactive  ? Days of Exercise per Week: 0 days  ? Minutes of Exercise per Session: 0 min  ?Stress: No Stress Concern Present  ? Feeling of Stress : Not at all  ?Social Connections: Moderately Integrated  ? Frequency of Communication with Friends and Family: More than three times a week  ? Frequency of Social Gatherings with Friends and Family: Three times a week  ? Attends Religious Services: More than 4 times per year  ? Active Member of Clubs or  Organizations: Yes  ? Attends Archivist Meetings: More than 4 times per year  ? Marital Status: Divorced  ?Intimate Partner Violence: Not At Risk  ? Fear of Current or Ex-Partner: No  ? Emotionally Abused: No  ? Physically Abused: No  ? Sexually Abused: No  ? ? ? ?Current Outpatient Medications:  ?  acetaminophen (TYLENOL) 500 MG tablet, Take 2 tablets (1,000 mg total) by mouth every 6 (six) hours as needed for mild pain or moderate pain., Disp: 30 tablet, Rfl: 0 ?  albuterol (VENTOLIN HFA) 108 (90 Base) MCG/ACT inhaler, Inhale 2 puffs into the lungs every 4 (four) hours as needed for wheezing or shortness of breath., Disp: 8.5 each, Rfl: 11 ?  Calcium Carb-Cholecalciferol (CALCIUM 1000 + D PO), Take by mouth., Disp: , Rfl:  ?  cetirizine (ZYRTEC ALLERGY) 10 MG  tablet, Take 1 tablet (10 mg total) by mouth daily., Disp: 30 tablet, Rfl: 1 ?  clopidogrel (PLAVIX) 75 MG tablet, Take 75 mg by mouth daily., Disp: , Rfl:  ?  diclofenac Sodium (VOLTAREN) 1 % GEL, Apply 4 grams to the skin 4 times daily. Need appt., Disp: 300 g, Rfl: 0 ?  escitalopram (LEXAPRO) 10 MG tablet, Take 1 tablet (10 mg total) by mouth daily., Disp: 30 tablet, Rfl: 5 ?  Evolocumab (REPATHA SURECLICK) 295 MG/ML SOAJ, Inject 140 mg into the skin every 14 (fourteen) days., Disp: 2 mL, Rfl: 11 ?  fluticasone furoate-vilanterol (BREO ELLIPTA) 100-25 MCG/ACT AEPB, Inhale 1 puff into the lungs daily., Disp: 1 each, Rfl: 11 ?  furosemide (LASIX) 40 MG tablet, TAKE ONE TABLET BY MOUTH EVERY MORNING, Disp: 90 tablet, Rfl: 0 ?  ketoconazole (NIZORAL) 2 % cream, APPLY TO THE AFFECTED AREA(S) of abdominal fold ONCE daily AS NEEDED, Disp: 60 g, Rfl: 0 ?  olmesartan-hydrochlorothiazide (BENICAR HCT) 20-12.5 MG tablet, Take 1 tablet by mouth daily., Disp: 30 tablet, Rfl: 11 ?  pantoprazole (PROTONIX) 20 MG tablet, Take 1 tablet (20 mg total) by mouth daily., Disp: 30 tablet, Rfl: 11 ?  potassium chloride SA (KLOR-CON M) 20 MEQ tablet, TAKE ONE TABLET BY  MOUTH TWICE DAILY, Disp: 180 tablet, Rfl: 0 ?  pregabalin (LYRICA) 100 MG capsule, Take 1 capsule (100 mg total) by mouth 2 (two) times daily., Disp: 60 capsule, Rfl: 5 ? ?Allergies  ?Allergen Reactions  ? Ferum

## 2022-04-23 ENCOUNTER — Ambulatory Visit: Payer: Medicare Other

## 2022-04-23 ENCOUNTER — Encounter: Payer: Self-pay | Admitting: Family Medicine

## 2022-04-23 ENCOUNTER — Telehealth (INDEPENDENT_AMBULATORY_CARE_PROVIDER_SITE_OTHER): Payer: 59 | Admitting: Family Medicine

## 2022-04-23 VITALS — Ht 59.0 in | Wt 228.0 lb

## 2022-04-23 DIAGNOSIS — J4521 Mild intermittent asthma with (acute) exacerbation: Secondary | ICD-10-CM | POA: Diagnosis not present

## 2022-04-23 MED ORDER — PREDNISONE 10 MG PO TABS: 10.0000 mg | ORAL_TABLET | Freq: Two times a day (BID) | ORAL | 0 refills | Status: DC

## 2022-04-23 MED ORDER — HYDROCOD POLI-CHLORPHE POLI ER 10-8 MG/5ML PO SUER: 5.0000 mL | Freq: Two times a day (BID) | ORAL | 0 refills | Status: DC | PRN

## 2022-04-27 DIAGNOSIS — J4520 Mild intermittent asthma, uncomplicated: Secondary | ICD-10-CM

## 2022-04-27 DIAGNOSIS — I10 Essential (primary) hypertension: Secondary | ICD-10-CM

## 2022-05-05 ENCOUNTER — Telehealth: Payer: Self-pay

## 2022-05-05 NOTE — Progress Notes (Signed)
? ? ?Chronic Care Management ?Pharmacy Assistant  ? ?Name: Molly [name]  MRN: [id number] DOB: January 03, 1951 ? ? ?Reason for Encounter: Medication Review/Medication Coordination Call. ?  ?Recent office visits:  ?04/23/2022 Dr. Ancil Boozer MD (PCP) start  prednisone  10 mg 2 times daily , Start Tussionex 10-8 mg/ml PRN  ?04/21/2022 Neldon Labella RN (CCM) No medication Changes noted ?04/16/2022 Dr. Ancil Boozer MD (PCP) No medication Changes noted ? ?Recent consult visits:  ?04/04/2022 Dr. Mickeal Skinner MD (Oncology) No Medication Changes noted ? ?Hospital visits:  ?None in previous 6 months ? ?Medications: ?Outpatient Encounter Medications as of 05/05/2022  ?Medication Sig Note  ? acetaminophen (TYLENOL) 500 MG tablet Take 2 tablets (1,000 mg total) by mouth every 6 (six) hours as needed for mild pain or moderate pain.   ? albuterol (VENTOLIN HFA) 108 (90 Base) MCG/ACT inhaler Inhale 2 puffs into the lungs every 4 (four) hours as needed for wheezing or shortness of breath.   ? Calcium Carb-Cholecalciferol (CALCIUM 1000 + D PO) Take by mouth.   ? cetirizine (ZYRTEC ALLERGY) 10 MG tablet Take 1 tablet (10 mg total) by mouth daily.   ? chlorpheniramine-HYDROcodone (TUSSIONEX PENNKINETIC ER) 10-8 MG/5ML Take 5 mLs by mouth every 12 (twelve) hours as needed.   ? clopidogrel (PLAVIX) 75 MG tablet Take 75 mg by mouth daily.   ? diclofenac Sodium (VOLTAREN) 1 % GEL Apply 4 grams to the skin 4 times daily. Need appt.   ? escitalopram (LEXAPRO) 10 MG tablet Take 1 tablet (10 mg total) by mouth daily.   ? Evolocumab (REPATHA SURECLICK) 962 MG/ML SOAJ Inject 140 mg into the skin every 14 (fourteen) days.   ? fluticasone furoate-vilanterol (BREO ELLIPTA) 100-25 MCG/ACT AEPB Inhale 1 puff into the lungs daily.   ? furosemide (LASIX) 40 MG tablet TAKE ONE TABLET BY MOUTH EVERY MORNING 02/17/2022: As needed  ? ketoconazole (NIZORAL) 2 % cream APPLY TO THE AFFECTED AREA(S) of abdominal fold ONCE daily AS NEEDED   ? olmesartan-hydrochlorothiazide (BENICAR  HCT) 20-12.5 MG tablet Take 1 tablet by mouth daily.   ? pantoprazole (PROTONIX) 20 MG tablet Take 1 tablet (20 mg total) by mouth daily.   ? potassium chloride SA (KLOR-CON M) 20 MEQ tablet TAKE ONE TABLET BY MOUTH TWICE DAILY   ? predniSONE (DELTASONE) 10 MG tablet Take 1 tablet (10 mg total) by mouth 2 (two) times daily with a meal.   ? pregabalin (LYRICA) 100 MG capsule Take 1 capsule (100 mg total) by mouth 2 (two) times daily.   ? ?No facility-administered encounter medications on file as of 05/05/2022.  ? ?Care Gaps: ?COVID Vaccine ? ?  ?Star Rating Drugs: ?Olmesartan-HCTZ 20-12.5 mg last filled on 04/09/2022 for 30 day supply at [facility]. ?  ?Medication Fill Gaps: ?None ? ? ?Reviewed chart for medication changes ahead of medication coordination call. ? ?BP Readings from Last 3 Encounters:  ?04/16/22 126/78  ?04/04/22 137/83  ?02/17/22 (!) 146/69  ?  ?Lab Results  ?Component Value Date  ? HGBA1C 6.2 (H) 04/16/2022  ?  ? ?Patient obtains medications through Adherence Packaging  30 Days  ? ?Last adherence delivery included: ?Escitalopram 10 mg one tablet daily-  Breakfast ?  ?Pregabalin 100 mg one Capsule two times daily- Breakfast,Evening meals  ?  ?Olmesartan-HCTZ 20-12.5 mg one tablet daily -Breakfast ? ?Potassium Chloride 20 MEQ 1 tablet  two times daily-Breakfast, evening meals ?  ?Pantoprazole  20 mg one tablet daily- Breakfast ?  ? Repatha 140 mg Inject 140  mg into the skin every 14 (fourteen) days ?  ?Calcium/Vitamin D3 315-250 once daily- Breakfast ?  ?Ventolin HFA 108 MCG/ACT -PRN  ? ?Patient declined medications last month  ?Ketoconazole 2% cream PRN  ?  ?Breo 100-25 MCG/ Inhaler - 1 puff daily (Adequate Supply) ?  ?Levocetirizine 5 mg one tablet daily - evening meals (Adequate supply) ?  ?Furosemide 40 mg PRN (Vials, patient states her provider inform her  to take PRN) ?  ?Coenzyme Q10 one capsule Daily (OTC) ? ?Patient is due for next adherence delivery on: 05/15/2022. ?Called patient and  reviewed medications and coordinated delivery. ? ?Unable to reach patient to completed Medication Coordination form. Form was completed based on last month delivery. [facility] will contact patient to confirm delivery.Junius Argyle, CPP was notified I was unable to reach patient ? ?This delivery to include: ?Escitalopram 10 mg one tablet daily-  Breakfast ?  ?Pregabalin 100 mg one Capsule two times daily- Breakfast,Evening meals  ?  ?Olmesartan-HCTZ 20-12.5 mg one tablet daily -Breakfast ? ?Potassium Chloride 20 MEQ 1 tablet  two times daily-Breakfast, evening meals ?  ?Pantoprazole  20 mg one tablet daily- Breakfast ?  ? Repatha 140 mg Inject 140 mg into the skin every 14 (fourteen) days ?  ?Calcium/Vitamin D3 315-250 once daily- Breakfast ?  ?Ventolin HFA 108 MCG/ACT -PRN  ? ?Did not speak with Patient to confirm what medications that should be declined, medication that is noted below for decline is base off of last month delivery: ?Ketoconazole 2% cream PRN  ?  ?Breo 100-25 MCG/ Inhaler - 1 puff daily (Adequate Supply) ?  ?Levocetirizine 5 mg one tablet daily - evening meals (Adequate supply) ?  ?Furosemide 40 mg PRN (Vials, patient states her provider inform her  to take PRN) ?  ?Coenzyme Q10 one capsule Daily (OTC) ? ?Patient needs refills for None ID. ? ?Unable to Confirmed delivery date of 05/15/2022 ([facility]), advised patient that pharmacy will contact them the morning of delivery. ? ?Anderson Malta ?Clinical Pharmacist Assistant ?[phone]  ? ?

## 2022-05-08 ENCOUNTER — Ambulatory Visit: Payer: 59

## 2022-05-22 ENCOUNTER — Ambulatory Visit (INDEPENDENT_AMBULATORY_CARE_PROVIDER_SITE_OTHER): Payer: 59

## 2022-05-22 DIAGNOSIS — Z0000 Encounter for general adult medical examination without abnormal findings: Secondary | ICD-10-CM

## 2022-05-22 NOTE — Patient Instructions (Signed)
Molly [name] , Thank you for taking time to come for your Medicare Wellness Visit. I appreciate your ongoing commitment to your health goals. Please review the following plan we discussed and let me know if I can assist you in the future.   Screening recommendations/referrals: Colonoscopy: done 07/15/21. Repeat 06/2028 Mammogram: done 11/18/21 Bone Density: done 11/18/21 Recommended yearly ophthalmology/optometry visit for glaucoma screening and checkup Recommended yearly dental visit for hygiene and checkup  Vaccinations: Influenza vaccine: done 08/26/21 Pneumococcal vaccine: done 04/06/18 Tdap vaccine: done 11/11/16 Shingles vaccine: Shingrix discussed. Please contact your pharmacy for coverage information.  Covid-19:declined  Advanced directives: Please bring a copy of your health care power of attorney and living will to the office at your convenience once you have completed those documents.   Conditions/risks identified: recommend increasing physical activity  Next appointment: Follow up in one year for your annual wellness visit    Preventive Care 65 Years and Older, Female Preventive care refers to lifestyle choices and visits with your health care provider that can promote health and wellness. What does preventive care include? A yearly physical exam. This is also called an annual well check. Dental exams once or twice a year. Routine eye exams. Ask your health care provider how often you should have your eyes checked. Personal lifestyle choices, including: Daily care of your teeth and gums. Regular physical activity. Eating a healthy diet. Avoiding tobacco and drug use. Limiting alcohol use. Practicing safe sex. Taking low-dose aspirin every day. Taking vitamin and mineral supplements as recommended by your health care provider. What happens during an annual well check? The services and screenings done by your health care provider during your annual well check will depend  on your age, overall health, lifestyle risk factors, and family history of disease. Counseling  Your health care provider may ask you questions about your: Alcohol use. Tobacco use. Drug use. Emotional well-being. Home and relationship well-being. Sexual activity. Eating habits. History of falls. Memory and ability to understand (cognition). Work and work Statistician. Reproductive health. Screening  You may have the following tests or measurements: Height, weight, and BMI. Blood pressure. Lipid and cholesterol levels. These may be checked every 5 years, or more frequently if you are over 9 years old. Skin check. Lung cancer screening. You may have this screening every year starting at age 37 if you have a 30-pack-year history of smoking and currently smoke or have quit within the past 15 years. Fecal occult blood test (FOBT) of the stool. You may have this test every year starting at age 7. Flexible sigmoidoscopy or colonoscopy. You may have a sigmoidoscopy every 5 years or a colonoscopy every 10 years starting at age 47. Hepatitis C blood test. Hepatitis B blood test. Sexually transmitted disease (STD) testing. Diabetes screening. This is done by checking your blood sugar (glucose) after you have not eaten for a while (fasting). You may have this done every 1-3 years. Bone density scan. This is done to screen for osteoporosis. You may have this done starting at age 31. Mammogram. This may be done every 1-2 years. Talk to your health care provider about how often you should have regular mammograms. Talk with your health care provider about your test results, treatment options, and if necessary, the need for more tests. Vaccines  Your health care provider may recommend certain vaccines, such as: Influenza vaccine. This is recommended every year. Tetanus, diphtheria, and acellular pertussis (Tdap, Td) vaccine. You may need a Td booster every 10 years.  Zoster vaccine. You may need  this after age 71. Pneumococcal 13-valent conjugate (PCV13) vaccine. One dose is recommended after age 75. Pneumococcal polysaccharide (PPSV23) vaccine. One dose is recommended after age 71. Talk to your health care provider about which screenings and vaccines you need and how often you need them. This information is not intended to replace advice given to you by your health care provider. Make sure you discuss any questions you have with your health care provider. Document Released: 01/11/2016 Document Revised: 09/03/2016 Document Reviewed: 10/16/2015 Elsevier Interactive Patient Education  2017 [place] Prevention in the Home Falls can cause injuries. They can happen to people of all ages. There are many things you can do to make your home safe and to help prevent falls. What can I do on the outside of my home? Regularly fix the edges of walkways and driveways and fix any cracks. Remove anything that might make you trip as you walk through a door, such as a raised step or threshold. Trim any bushes or trees on the path to your home. Use bright outdoor lighting. Clear any walking paths of anything that might make someone trip, such as rocks or tools. Regularly check to see if handrails are loose or broken. Make sure that both sides of any steps have handrails. Any raised decks and porches should have guardrails on the edges. Have any leaves, snow, or ice cleared regularly. Use sand or salt on walking paths during winter. Clean up any spills in your garage right away. This includes oil or grease spills. What can I do in the bathroom? Use night lights. Install grab bars by the toilet and in the tub and shower. Do not use towel bars as grab bars. Use non-skid mats or decals in the tub or shower. If you need to sit down in the shower, use a plastic, non-slip stool. Keep the floor dry. Clean up any water that spills on the floor as soon as it happens. Remove soap buildup in the tub  or shower regularly. Attach bath mats securely with double-sided non-slip rug tape. Do not have throw rugs and other things on the floor that can make you trip. What can I do in the bedroom? Use night lights. Make sure that you have a light by your bed that is easy to reach. Do not use any sheets or blankets that are too big for your bed. They should not hang down onto the floor. Have a firm chair that has side arms. You can use this for support while you get dressed. Do not have throw rugs and other things on the floor that can make you trip. What can I do in the kitchen? Clean up any spills right away. Avoid walking on wet floors. Keep items that you use a lot in easy-to-reach places. If you need to reach something above you, use a strong step stool that has a grab bar. Keep electrical cords out of the way. Do not use floor polish or wax that makes floors slippery. If you must use wax, use non-skid floor wax. Do not have throw rugs and other things on the floor that can make you trip. What can I do with my stairs? Do not leave any items on the stairs. Make sure that there are handrails on both sides of the stairs and use them. Fix handrails that are broken or loose. Make sure that handrails are as long as the stairways. Check any carpeting to make sure that  it is firmly attached to the stairs. Fix any carpet that is loose or worn. Avoid having throw rugs at the top or bottom of the stairs. If you do have throw rugs, attach them to the floor with carpet tape. Make sure that you have a light switch at the top of the stairs and the bottom of the stairs. If you do not have them, ask someone to add them for you. What else can I do to help prevent falls? Wear shoes that: Do not have high heels. Have rubber bottoms. Are comfortable and fit you well. Are closed at the toe. Do not wear sandals. If you use a stepladder: Make sure that it is fully opened. Do not climb a closed stepladder. Make  sure that both sides of the stepladder are locked into place. Ask someone to hold it for you, if possible. Clearly mark and make sure that you can see: Any grab bars or handrails. First and last steps. Where the edge of each step is. Use tools that help you move around (mobility aids) if they are needed. These include: Canes. Walkers. Scooters. Crutches. Turn on the lights when you go into a dark area. Replace any light bulbs as soon as they burn out. Set up your furniture so you have a clear path. Avoid moving your furniture around. If any of your floors are uneven, fix them. If there are any pets around you, be aware of where they are. Review your medicines with your doctor. Some medicines can make you feel dizzy. This can increase your chance of falling. Ask your doctor what other things that you can do to help prevent falls. This information is not intended to replace advice given to you by your health care provider. Make sure you discuss any questions you have with your health care provider. Document Released: 10/11/2009 Document Revised: 05/22/2016 Document Reviewed: 01/19/2015 Elsevier Interactive Patient Education  2017 [employer].

## 2022-05-22 NOTE — Progress Notes (Signed)
Subjective:   Molly [name] is a 71 y.o. female who presents for Medicare Annual (Subsequent) preventive examination.  Virtual Visit via Telephone Note  I connected with  Molly [name] on 18/[birth year]/31 at  3:00 PM EDT by telephone and verified that I am speaking with the correct person using two identifiers.  Location: Patient: home Provider: [place] Persons participating in the virtual visit: Molly [name]   I discussed the limitations, risks, security and privacy concerns of performing an evaluation and management service by telephone and the availability of in person appointments. The patient expressed understanding and agreed to proceed.  Interactive audio and video telecommunications were attempted between this nurse and patient, however failed, due to patient having technical difficulties OR patient did not have access to video capability.  We continued and completed visit with audio only.  Some vital signs may be absent or patient reported.   Molly Marker, LPN   Review of Systems     Cardiac Risk Factors include: advanced age (>73mn, >>22women);dyslipidemia;hypertension;obesity (BMI >30kg/m2);sedentary lifestyle     Objective:    There were no vitals filed for this visit. There is no height or weight on file to calculate BMI.     05/22/2022    3:03 PM 04/04/2022   10:02 AM 02/17/2022    1:09 PM 07/15/2021    8:25 AM 05/07/2021    9:06 AM 04/24/2021   11:15 AM 11/20/2020    2:06 PM  Advanced Directives  Does Patient Have a Medical Advance Directive? No Yes Yes No No No No  Type of ACorporate treasurerof [place]laterLiving will [place]ollar [place] will      Does patient want to make changes to medical advance directive?   No - Patient declined  No - Patient declined Yes (MAU/Ambulatory/Procedural Areas - Information given) No - Patient declined  Would patient like information on creating a medical advance directive? No - Patient  declined  No - Patient declined No - Patient declined   No - Patient declined    Current Medications (verified) Outpatient Encounter Medications as of 05/22/2022  Medication Sig   acetaminophen (TYLENOL) 500 MG tablet Take 2 tablets (1,000 mg total) by mouth every 6 (six) hours as needed for mild pain or moderate pain.   albuterol (VENTOLIN HFA) 108 (90 Base) MCG/ACT inhaler Inhale 2 puffs into the lungs every 4 (four) hours as needed for wheezing or shortness of breath.   Calcium Carb-Cholecalciferol (CALCIUM 1000 + D PO) Take by mouth.   cetirizine (ZYRTEC ALLERGY) 10 MG tablet Take 1 tablet (10 mg total) by mouth daily.   chlorpheniramine-HYDROcodone (TUSSIONEX PENNKINETIC ER) 10-8 MG/5ML Take 5 mLs by mouth every 12 (twelve) hours as needed.   clopidogrel (PLAVIX) 75 MG tablet Take 75 mg by mouth daily.   diclofenac Sodium (VOLTAREN) 1 % GEL Apply 4 grams to the skin 4 times daily. Need appt.   escitalopram (LEXAPRO) 10 MG tablet Take 1 tablet (10 mg total) by mouth daily.   Evolocumab (REPATHA SURECLICK) 1497MG/ML SOAJ Inject 140 mg into the skin every 14 (fourteen) days.   fluticasone furoate-vilanterol (BREO ELLIPTA) 100-25 MCG/ACT AEPB Inhale 1 puff into the lungs daily.   furosemide (LASIX) 40 MG tablet TAKE ONE TABLET BY MOUTH EVERY MORNING   ketoconazole (NIZORAL) 2 % cream APPLY TO THE AFFECTED AREA(S) of abdominal fold ONCE daily AS NEEDED   olmesartan-hydrochlorothiazide (BENICAR HCT) 20-12.5 MG tablet Take 1 tablet by mouth daily.  pantoprazole (PROTONIX) 20 MG tablet Take 1 tablet (20 mg total) by mouth daily.   potassium chloride SA (KLOR-CON M) 20 MEQ tablet TAKE ONE TABLET BY MOUTH TWICE DAILY   pregabalin (LYRICA) 100 MG capsule Take 1 capsule (100 mg total) by mouth 2 (two) times daily.   [DISCONTINUED] predniSONE (DELTASONE) 10 MG tablet Take 1 tablet (10 mg total) by mouth 2 (two) times daily with a meal.   No facility-administered encounter medications on file as of  05/22/2022.    Allergies (verified) Ferumoxytol, Liraglutide, Nsaids, Clopidogrel, Statins, Oxycodone, Crestor [rosuvastatin calcium], and Zetia [ezetimibe]   History: Past Medical History:  Diagnosis Date   Anxiety    Asthma    history of asthma   Cardiomyopathy ([place])    Complication of anesthesia    tore hair out and made teeth rough   COVID-19 virus infection 09/08/2019   Depression    Endometrial cancer ([place]) 08/2018   GERD (gastroesophageal reflux disease)    takes prilosec prn   History of CVA (cerebrovascular accident) 12/10/2015   Hyperlipidemia LDL goal <70 12/10/2015   Hypertension    Prediabetes 10/02/2017   A1c 6 in January 2018   Stroke [facility]) 1990    no residual effects   Past Surgical History:  Procedure Laterality Date   ABDOMINAL HYSTERECTOMY     CHOLECYSTECTOMY     COLONOSCOPY WITH PROPOFOL N/A 01/08/2017   Procedure: COLONOSCOPY WITH PROPOFOL;  Surgeon: [name], MD;  Location: [facility] ENDOSCOPY;  Service: Endoscopy;  Laterality: N/A;   COLONOSCOPY WITH PROPOFOL N/A 05/05/2018   Procedure: COLONOSCOPY WITH PROPOFOL;  Surgeon: [name], MD;  Location: [facility] ENDOSCOPY;  Service: Gastroenterology;  Laterality: N/A;   COLONOSCOPY WITH PROPOFOL N/A 07/15/2021   Procedure: COLONOSCOPY WITH PROPOFOL;  Surgeon: [name], MD;  Location: [facility] ENDOSCOPY;  Service: Gastroenterology;  Laterality: N/A;   HERNIA REPAIR  69/6789   umbilical   PORTACATH PLACEMENT N/A 11/04/2018   Procedure: INSERTION PORT-A-CATH;  Surgeon: Jules Husbands, MD;  Location: [facility] ORS;  Service: General;  Laterality: N/A;   SENTINEL NODE BIOPSY N/A 10/06/2018   Procedure: SENTINEL NODE BIOPSY;  Surgeon: Mellody Drown, MD;  Location: [facility] ORS;  Service: Gynecology;  Laterality: N/A;   UMBILICAL HERNIA REPAIR N/A 11/17/2017   Procedure: HERNIA REPAIR UMBILICAL ADULT;  Surgeon: Jules Husbands, MD;  Location: [facility] ORS;  Service: General;  Laterality: N/A;   Family History  Problem Relation Age  of Onset   Stroke Mother    Hypertension Mother    Dementia Mother    Alzheimer's disease Mother    Gout Father    Asthma Father    Hypertension Father    Dementia Father    Healthy Sister    Stroke Brother    Alzheimer's disease Brother    Healthy Daughter    Hypertension Brother    Healthy Brother    Cancer Paternal 12    Healthy Sister    Healthy Sister    Hypertension Sister    Hypertension Sister    Stroke Brother    Alzheimer's disease Brother    Stroke Brother    Hypertension Brother    Stroke Brother    Hypertension Brother    Healthy Brother    Healthy Brother    Hypertension Daughter    Breast cancer Neg Hx    Social History   Socioeconomic History   Marital status: Divorced    Spouse name: Not on file   Number of children: 2  Years of education: some college   Highest education level: 12th grade  Occupational History   Occupation: Retired    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   Occupation: currently a Theme park manager  Tobacco Use   Smoking status: Never   Smokeless tobacco: Never   Tobacco comments:    smoking cessation materials not required  Vaping Use   Vaping Use: Never used  Substance and Sexual Activity   Alcohol use: No    Alcohol/week: 0.0 standard drinks   Drug use: No   Sexual activity: Not Currently  Other Topics Concern   Not on file  Social History Narrative   Pt lives alone but currently staying with her daughter   Social Determinants of Health   Financial Resource Strain: Low Risk    Difficulty of Paying Living Expenses: Not hard at all  Food Insecurity: No Food Insecurity   Worried About Charity fundraiser in the Last Year: Never true   Arboriculturist in the Last Year: Never true  Transportation Needs: No Transportation Needs   Lack of Transportation (Medical): No   Lack of Transportation (Non-Medical): No  Physical Activity: Inactive   Days of Exercise per Week: 0 days   Minutes of Exercise per Session: 0 min   Stress: No Stress Concern Present   Feeling of Stress : Not at all  Social Connections: Moderately Integrated   Frequency of Communication with Friends and Family: More than three times a week   Frequency of Social Gatherings with Friends and Family: Three times a week   Attends Religious Services: More than 4 times per year   Active Member of Clubs or Organizations: Yes   Attends Music therapist: More than 4 times per year   Marital Status: Divorced    Tobacco Counseling Counseling given: Not Answered Tobacco comments: smoking cessation materials not required   Clinical Intake:  Pre-visit preparation completed: Yes  Pain : No/denies pain     Nutritional Risks: None Diabetes: No  How often do you need to have someone help you when you read instructions, pamphlets, or other written materials from your doctor or pharmacy?: 1 - Never    Interpreter Needed?: No  Information entered by :: Molly Marker LPN   Activities of Daily Living    05/22/2022    3:04 PM 04/23/2022   11:21 AM  In your present state of health, do you have any difficulty performing the following activities:  Hearing? 0 0  Vision? 0 0  Difficulty concentrating or making decisions? 0 0  Walking or climbing stairs? 1 1  Dressing or bathing? 0 0  Doing errands, shopping? 1 1  Preparing Food and eating ? N   Using the Toilet? N   In the past six months, have you accidently leaked urine? N   Do you have problems with loss of bowel control? N   Managing your Medications? N   Managing your Finances? N   Housekeeping or managing your Housekeeping? N     Patient Care Team: Steele Sizer, MD as PCP - General (Family Medicine) Kate Sable, MD as PCP - Cardiology (Cardiology) Rubie Maid, MD as Consulting Physician (Obstetrics and Gynecology) Clent Jacks, RN as Registered Nurse Noreene Filbert, MD as Referring Physician (Radiation Oncology) Sindy Guadeloupe, MD as Consulting  Physician (Oncology) Mellody Drown, MD as Referring Physician (Obstetrics) Germaine Pomfret, [facility] (Pharmacist) Neldon Labella, RN as Case Manager Sunday Corn, Mervin Kung (Neurology) Mickeal Skinner,  Acey Lav, MD as Consulting Physician (Oncology)  Indicate any recent Medical Services you may have received from other than [facility] providers in the past year (date may be approximate).     Assessment:   This is a routine wellness examination for Molly [name].  Hearing/Vision screen Hearing Screening - Comments:: Pt denies hearing difficulty Vision Screening - Comments:: Annual vision screenings done by [facility]  Dietary issues and exercise activities discussed: Current Exercise Habits: The patient does not participate in regular exercise at present, Exercise limited by: None identified   Goals Addressed   None    Depression Screen    05/22/2022    3:02 PM 04/23/2022   11:21 AM 04/16/2022    1:19 PM 03/19/2022    9:14 AM 12/03/2021   12:35 PM 10/14/2021    2:13 PM 08/26/2021   11:07 AM  PHQ 2/9 Scores  PHQ - 2 Score 0 0 0 0 0 0 0  PHQ- 9 Score   0   0 0    Fall Risk    05/22/2022    3:04 PM 04/23/2022   11:21 AM 04/16/2022    1:18 PM 03/19/2022    9:13 AM 12/03/2021   12:35 PM  [place] in the past year? 0 0 0 0 0  Number falls in past yr: 0  0 0 0  Injury with Fall? 0  0 0 0  Risk for fall due to : No Fall Risks  No Fall Risks    Follow up Falls prevention discussed Falls prevention discussed Falls prevention discussed Falls evaluation completed Falls evaluation completed    [place]:  Any stairs in or around the home? Yes  If so, are there any without handrails? No  Home free of loose throw rugs in walkways, pet beds, electrical cords, etc? Yes  Adequate lighting in your home to reduce risk of falls? Yes   ASSISTIVE DEVICES UTILIZED TO PREVENT FALLS:  Life alert? No  Use of a cane, walker or w/c? No  Grab bars in the  bathroom? No  Shower chair or bench in shower? No  Elevated toilet seat or a handicapped toilet? No   TIMED UP AND GO:  Was the test performed? No . Telephonic visit.   Cognitive Function: Normal cognitive status assessed by direct observation by this Nurse Health Advisor. No abnormalities found.          05/03/2020    8:59 AM 04/07/2019   12:16 PM 04/06/2018    3:22 PM  6CIT Screen  What Year? 0 points 0 points 0 points  What month? 0 points 0 points 0 points  What time? 0 points 0 points 3 points  Count back from 20 0 points 0 points 0 points  Months in reverse 0 points 0 points 0 points  Repeat phrase 0 points 0 points 0 points  Total Score 0 points 0 points 3 points    Immunizations Immunization History  Administered Date(s) Administered   Fluad Quad(high Dose 65+) 10/05/2019, 10/15/2020   Influenza, High Dose Seasonal PF 11/11/2016, 10/02/2017, 09/07/2018, 08/26/2021   Pneumococcal Conjugate-13 04/06/2018   Pneumococcal Polysaccharide-23 11/11/2016   Tdap 11/11/2016    TDAP status: Up to date  Flu Vaccine status: Up to date  Pneumococcal vaccine status: Up to date  Covid-19 vaccine status: Declined, Education has been provided regarding the importance of this vaccine but patient still declined. Advised may receive this vaccine at local  pharmacy or Health Dept.or vaccine clinic. Aware to provide a copy of the vaccination record if obtained from local pharmacy or Health Dept. Verbalized acceptance and understanding.  Qualifies for Shingles Vaccine? Yes   Zostavax completed No   Shingrix Completed?: No.    Education has been provided regarding the importance of this vaccine. Patient has been advised to call insurance company to determine out of pocket expense if they have not yet received this vaccine. Advised may also receive vaccine at local pharmacy or Health Dept. Verbalized acceptance and understanding.  Screening Tests Health Maintenance  Topic Date Due    COVID-19 Vaccine (1) Never done   Zoster Vaccines- Shingrix (1 of 2) 07/23/2022 (Originally 11/07/1970)   INFLUENZA VACCINE  07/29/2022   MAMMOGRAM  11/18/2022   TETANUS/TDAP  11/11/2026   COLONOSCOPY (Pts 45-66yr Insurance coverage will need to be confirmed)  07/15/2028   Pneumonia Vaccine 71 Years old  Completed   DEXA SCAN  Completed   Hepatitis C Screening  Completed   HPV VACCINES  Aged Out    Health Maintenance  Health Maintenance Due  Topic Date Due   COVID-19 Vaccine (1) Never done    Colorectal cancer screening: Type of screening: Colonoscopy. Completed 07/15/21. Repeat every 7 years  Mammogram status: Completed 11/18/21. Repeat every year  Bone Density status: Completed 11/18/21. Results reflect: Bone density results: OSTEOPENIA. Repeat every 2 years.  Lung Cancer Screening: (Low Dose CT Chest recommended if Age 71-80years, 30 pack-year currently smoking OR have quit w/in 15years.) does not qualify.   Additional Screening:  Hepatitis C Screening: does qualify; Completed 01/23/17  Vision Screening: Recommended annual ophthalmology exams for early detection of glaucoma and other disorders of the eye. Is the patient up to date with their annual eye exam?  Yes  Who is the provider or what is the name of the office in which the patient attends annual eye exams? [facility].   Dental Screening: Recommended annual dental exams for proper oral hygiene  Community Resource Referral / Chronic Care Management: CRR required this visit?  No   CCM required this visit?  No      Plan:     I have personally reviewed and noted the following in the patient's chart:   Medical and social history Use of alcohol, tobacco or illicit drugs  Current medications and supplements including opioid prescriptions.  Functional ability and status Nutritional status Physical activity Advanced directives List of other physicians Hospitalizations, surgeries, and ER visits in  previous 12 months Vitals Screenings to include cognitive, depression, and falls Referrals and appointments  In addition, I have reviewed and discussed with patient certain preventive protocols, quality metrics, and best practice recommendations. A written personalized care plan for preventive services as well as general preventive health recommendations were provided to patient.     KClemetine Marker LPN   [id number]  Nurse Notes: none

## 2022-06-03 ENCOUNTER — Telehealth: Payer: Self-pay

## 2022-06-03 DIAGNOSIS — R600 Localized edema: Secondary | ICD-10-CM

## 2022-06-03 NOTE — Progress Notes (Signed)
Chronic Care Management Pharmacy Assistant   Name: Molly [name]  MRN: [id number] DOB: [birth year]  Reason for Encounter: Medication Review/Medication Coordination Call.   Recent office visits:  05/22/2022 Clemetine Marker LPN (PCP Office) Medicare Wellness completed, No medication Changes  Recent consult visits:  No recent [facility] visits:  None in previous 6 months  Medications: Outpatient Encounter Medications as of 06/03/2022  Medication Sig Note   acetaminophen (TYLENOL) 500 MG tablet Take 2 tablets (1,000 mg total) by mouth every 6 (six) hours as needed for mild pain or moderate pain.    albuterol (VENTOLIN HFA) 108 (90 Base) MCG/ACT inhaler Inhale 2 puffs into the lungs every 4 (four) hours as needed for wheezing or shortness of breath.    Calcium Carb-Cholecalciferol (CALCIUM 1000 + D PO) Take by mouth.    cetirizine (ZYRTEC ALLERGY) 10 MG tablet Take 1 tablet (10 mg total) by mouth daily.    chlorpheniramine-HYDROcodone (TUSSIONEX PENNKINETIC ER) 10-8 MG/5ML Take 5 mLs by mouth every 12 (twelve) hours as needed.    clopidogrel (PLAVIX) 75 MG tablet Take 75 mg by mouth daily.    diclofenac Sodium (VOLTAREN) 1 % GEL Apply 4 grams to the skin 4 times daily. Need appt.    escitalopram (LEXAPRO) 10 MG tablet Take 1 tablet (10 mg total) by mouth daily.    Evolocumab (REPATHA SURECLICK) 701 MG/ML SOAJ Inject 140 mg into the skin every 14 (fourteen) days.    fluticasone furoate-vilanterol (BREO ELLIPTA) 100-25 MCG/ACT AEPB Inhale 1 puff into the lungs daily.    furosemide (LASIX) 40 MG tablet TAKE ONE TABLET BY MOUTH EVERY MORNING 02/17/2022: As needed   ketoconazole (NIZORAL) 2 % cream APPLY TO THE AFFECTED AREA(S) of abdominal fold ONCE daily AS NEEDED    olmesartan-hydrochlorothiazide (BENICAR HCT) 20-12.5 MG tablet Take 1 tablet by mouth daily.    pantoprazole (PROTONIX) 20 MG tablet Take 1 tablet (20 mg total) by mouth daily.    potassium chloride SA (KLOR-CON M)  20 MEQ tablet TAKE ONE TABLET BY MOUTH TWICE DAILY    pregabalin (LYRICA) 100 MG capsule Take 1 capsule (100 mg total) by mouth 2 (two) times daily.    No facility-administered encounter medications on file as of 06/03/2022.    Care Gaps: COVID Vaccine     Star Rating Drugs: Olmesartan-HCTZ 20-12.5 mg last filled on 05/12/2022 for 30 day supply at [facility].   Medication Fill Gaps: None  Reviewed chart for medication changes ahead of medication coordination call.  BP Readings from Last 3 Encounters:  04/16/22 126/78  04/04/22 137/83  02/17/22 (!) 146/69    Lab Results  Component Value Date   HGBA1C 6.2 (H) 04/16/2022     Patient obtains medications through Adherence Packaging  30 Days   Unable to reach patient to completed Medication Coordination form last month. Form was completed based on last month delivery.  Last adherence delivery included:  Escitalopram 10 mg one tablet daily-  Breakfast   Pregabalin 100 mg one Capsule two times daily- Breakfast,Evening meals    Olmesartan-HCTZ 20-12.5 mg one tablet daily -Breakfast   Potassium Chloride 20 MEQ 1 tablet  two times daily-Breakfast, evening meals   Pantoprazole  20 mg one tablet daily- Breakfast    Repatha 140 mg Inject 140 mg into the skin every 14 (fourteen) days   Calcium/Vitamin D3 315-250 once daily- Breakfast   Ventolin HFA 108 MCG/ACT -PRN   Patient declined medications last month: Ketoconazole 2% cream  PRN    Breo 100-25 MCG/ Inhaler - 1 puff daily (Adequate Supply)   Levocetirizine 5 mg one tablet daily - evening meals (Adequate supply)   Furosemide 40 mg PRN (Vials, patient states her provider inform her  to take PRN)   Coenzyme Q10 one capsule Daily (OTC)  Patient is due for next adherence delivery on: 06/13/2022. Called patient and reviewed medications and coordinated delivery.  This delivery to include: Escitalopram 10 mg one tablet daily-  Breakfast   Pregabalin 100 mg one  Capsule two times daily- Breakfast,Evening meals    Olmesartan-HCTZ 20-12.5 mg one tablet daily -Breakfast   Potassium Chloride 20 MEQ 1 tablet  two times daily-Breakfast, evening meals   Pantoprazole  20 mg one tablet daily- Breakfast    Repatha 140 mg Inject 140 mg into the skin every 14 (fourteen) days   Calcium/Vitamin D3 315-250 once daily- Breakfast    Patient declined the following medications Ventolin HFA 108 MCG/ACT -PRN   Ketoconazole 2% cream PRN    Breo 100-25 MCG/ Inhaler - 1 puff daily (Adequate Supply- last filled 03/20/2022 for 30 DS)   Levocetirizine 5 mg one tablet daily - evening meals (Adequate supply)   Furosemide 40 mg PRN (Vials, patient states her provider inform her  to take PRN)   Coenzyme Q10 one capsule Daily (OTC)  Patient needs refills for Potassium Chloride  - prescribe by PCP.  Confirmed delivery date of 06/13/2022 (First route), advised patient that pharmacy will contact them the morning of delivery.  [place] Pharmacist Assistant [phone]

## 2022-06-05 MED ORDER — POTASSIUM CHLORIDE CRYS ER 20 MEQ PO TBCR: 20.0000 meq | EXTENDED_RELEASE_TABLET | Freq: Two times a day (BID) | ORAL | 1 refills | Status: DC

## 2022-06-05 NOTE — Addendum Note (Signed)
Addended by: Daron Offer A on: 06/05/2022 08:18 AM   Modules accepted: Orders

## 2022-07-03 ENCOUNTER — Telehealth: Payer: Self-pay

## 2022-07-03 NOTE — Progress Notes (Signed)
Chronic Care Management Pharmacy Assistant   Name: GABRELLE ROCA  MRN: [id number] DOB: Jul 09, 1951  Reason for Encounter: Medication Review/Medication Coordination Call.   Recent office visits:  No recent office visit  Recent consult visits:  No recent consult visit  Hospital visits:  None in previous 6 months  Medications: Outpatient Encounter Medications as of 07/03/2022  Medication Sig Note   acetaminophen (TYLENOL) 500 MG tablet Take 2 tablets (1,000 mg total) by mouth every 6 (six) hours as needed for mild pain or moderate pain.    albuterol (VENTOLIN HFA) 108 (90 Base) MCG/ACT inhaler Inhale 2 puffs into the lungs every 4 (four) hours as needed for wheezing or shortness of breath.    Calcium Carb-Cholecalciferol (CALCIUM 1000 + D PO) Take by mouth.    cetirizine (ZYRTEC ALLERGY) 10 MG tablet Take 1 tablet (10 mg total) by mouth daily.    chlorpheniramine-HYDROcodone (TUSSIONEX PENNKINETIC ER) 10-8 MG/5ML Take 5 mLs by mouth every 12 (twelve) hours as needed.    clopidogrel (PLAVIX) 75 MG tablet Take 75 mg by mouth daily.    diclofenac Sodium (VOLTAREN) 1 % GEL Apply 4 grams to the skin 4 times daily. Need appt.    escitalopram (LEXAPRO) 10 MG tablet Take 1 tablet (10 mg total) by mouth daily.    Evolocumab (REPATHA SURECLICK) 630 MG/ML SOAJ Inject 140 mg into the skin every 14 (fourteen) days.    fluticasone furoate-vilanterol (BREO ELLIPTA) 100-25 MCG/ACT AEPB Inhale 1 puff into the lungs daily.    furosemide (LASIX) 40 MG tablet TAKE ONE TABLET BY MOUTH EVERY MORNING 02/17/2022: As needed   ketoconazole (NIZORAL) 2 % cream APPLY TO THE AFFECTED AREA(S) of abdominal fold ONCE daily AS NEEDED    olmesartan-hydrochlorothiazide (BENICAR HCT) 20-12.5 MG tablet Take 1 tablet by mouth daily.    pantoprazole (PROTONIX) 20 MG tablet Take 1 tablet (20 mg total) by mouth daily.    potassium chloride SA (KLOR-CON M) 20 MEQ tablet Take 1 tablet (20 mEq total) by mouth 2 (two) times  daily.    pregabalin (LYRICA) 100 MG capsule Take 1 capsule (100 mg total) by mouth 2 (two) times daily.    No facility-administered encounter medications on file as of 07/03/2022.    Care Gaps: COVID Vaccine     Star Rating Drugs: Olmesartan-HCTZ 20-12.5 mg last filled on 06/10/2022 for 30 day supply at [facility].   Medication Fill Gaps: None  Reviewed chart for medication changes ahead of medication coordination call.  No OVs, Consults, or hospital visits since last care coordination call/Pharmacist visit. (If appropriate, list visit date, provider name)  No medication changes indicated OR if recent visit, treatment plan here.  BP Readings from Last 3 Encounters:  04/16/22 126/78  04/04/22 137/83  02/17/22 (!) 146/69    Lab Results  Component Value Date   HGBA1C 6.2 (H) 04/16/2022     Patient obtains medications through Adherence Packaging  30 Days   Last adherence delivery included: Escitalopram 10 mg one tablet daily-  Breakfast   Pregabalin 100 mg one Capsule two times daily- Breakfast,Evening meals    Olmesartan-HCTZ 20-12.5 mg one tablet daily -Breakfast   Potassium Chloride 20 MEQ 1 tablet  two times daily-Breakfast, evening meals   Pantoprazole  20 mg one tablet daily- Breakfast    Repatha 140 mg Inject 140 mg into the skin every 14 (fourteen) days   Calcium/Vitamin D3 315-250 once daily- Breakfast  Patient declined medications last month: Ventolin HFA 108  MCG/ACT -PRN    Ketoconazole 2% cream PRN    Breo 100-25 MCG/ Inhaler - 1 puff daily (Adequate Supply- last filled 03/20/2022 for 30 DS)   Levocetirizine 5 mg one tablet daily - evening meals (Adequate supply)   Furosemide 40 mg PRN (Vials, patient states her provider inform her  to take PRN)   Coenzyme Q10 one capsule Daily (OTC)  Patient is due for next adherence delivery on: 07/15/2022. Called patient and reviewed medications and coordinated delivery.  This delivery to  include: Escitalopram 10 mg one tablet daily-  Breakfast   Pregabalin 100 mg one Capsule two times daily- Breakfast,Evening meals    Olmesartan-HCTZ 20-12.5 mg one tablet daily -Breakfast   Potassium Chloride 20 MEQ 1 tablet  two times daily-Breakfast, evening meals   Pantoprazole  20 mg one tablet daily- Breakfast    Repatha 140 mg Inject 140 mg into the skin every 14 (fourteen) days   Calcium/Vitamin D3 315-250 once daily- Breakfast   Patient declined the following medications: Ventolin HFA 108 MCG/ACT -PRN    Ketoconazole 2% cream PRN    Breo 100-25 MCG/ Inhaler - 1 puff daily  (Patient states she really does not use Breo inhaler as much only PRN - last filled 03/20/2022 for 30 DS)   Levocetirizine 5 mg one tablet daily - evening meals (Adequate supply)   Furosemide 40 mg PRN (Vials, patient states her provider inform her  to take PRN)   Coenzyme Q10 one capsule Daily (OTC)  Patient needs refills for None ID.  Confirmed delivery date of 07/15/2022 ([facility]), advised patient that pharmacy will contact them the morning of delivery.  [place] Pharmacist Assistant [phone]

## 2022-07-08 ENCOUNTER — Telehealth: Payer: Self-pay

## 2022-07-08 NOTE — Progress Notes (Signed)
Chronic Care Management APPOINTMENT REMINDER   Molly [name] was reminded to have all medications, supplements and any blood glucose and blood pressure readings available for review with Junius Argyle, Pharm. D, at her telephone visit on 07/09/2022 at 11:00 am.  Patient Confirm appointment.  [place] Pharmacist Assistant [phone]

## 2022-07-09 ENCOUNTER — Ambulatory Visit (INDEPENDENT_AMBULATORY_CARE_PROVIDER_SITE_OTHER): Payer: 59

## 2022-07-09 DIAGNOSIS — E785 Hyperlipidemia, unspecified: Secondary | ICD-10-CM

## 2022-07-09 DIAGNOSIS — J4520 Mild intermittent asthma, uncomplicated: Secondary | ICD-10-CM

## 2022-07-09 NOTE — Patient Instructions (Signed)
Visit Information It was great speaking with you today!  Please let me know if you have any questions about our visit.   Goals Addressed             This Visit's Progress    Track and Manage My Symptoms-Asthma   On track    Timeframe:  Long-Range Goal Priority:  High Start Date: 04/03/2021                            Expected End Date:  10/03/2022                     Follow Up within 90 days   - avoid symptom triggers outdoors - eliminate symptom triggers at home - keep rescue medicines on hand  - take long-acting maintenance inhaler every daily    Why is this important?   Keeping track of asthma symptoms can tell you a lot about your asthma control.  Based on symptoms and peak flow results you can see how well you are doing.  Your asthma action plan has a green, yellow and red zone. Green means all is good; it is your goal. Yellow means your symptoms are a little worse. You will need to adjust your medications. Being in the red zone means that your   asthma is out of control. You will need to use your rescue medicines. You may need emergency care.     Notes:         Patient Care Plan: General Pharmacy (Adult)     Problem Identified: Hypertension, Hyperlipidemia, Coronary Artery Disease, Asthma, Depression and Allergic Rhinitis   Priority: High     Long-Range Goal: Patient-Specific Goal   Start Date: 04/03/2021  Expected End Date: 11/01/2022  This Visit's Progress: On track  Recent Progress: On track  Priority: High  Note:    Current Barriers:  Unable to independently monitor therapeutic efficacy Unable to achieve control of Cholesterol   Pharmacist Clinical Goal(s):  Patient will achieve control of blood pressure as evidenced by Home BP less than 140/90 maintain control of cholesterol as evidenced by LDL less than 70  through collaboration with PharmD and provider.   Interventions: 1:1 collaboration with Steele Sizer, MD regarding development and update of  comprehensive plan of care as evidenced by provider attestation and co-signature Inter-disciplinary care team collaboration (see longitudinal plan of care) Comprehensive medication review performed; medication list updated in electronic medical record  Hypertension (BP goal <140/90) -Controlled -Current treatment: Furosemide 40 mg daily Olmesartan-HCTZ 20-12.5 mg daily -Medications previously tried: NA  -Current home readings: NA -Denies hypotensive/hypertensive symptoms -Educated on Daily salt intake goal < 2300 mg; Exercise goal of 150 minutes per week; Importance of home blood pressure monitoring; -Counseled to monitor BP at home weekly, document, and provide log at future appointments -Recommended to continue current medication  Hyperlipidemia: (LDL goal < 70) -History of TIA  -Uncontrolled -Current treatment: Repatha 140 mg every 14 days: Appropriate, Effective, Safe, Accessible -Current antiplatelet treatment: Clopidogrel 75 mg daily (evening): Appropriate, Effective, Safe, Accessible  -Medications previously tried: Statins, Aspirin -Current dietary habits: cheerios, baked chicken, salad. Steak <1x monthly  -Continue current medications  Asthma (Goal: control symptoms and prevent exacerbations) -Not ideally controlled -Current treatment  Ventolin HFA 2 puffs every 4 hours as needed Breo 1 puff daily as needed  -Current allergic rhinitis treatment  Cetirizine 10 mg daily -Medications previously tried: NA  -Allergies normally worse  in the fall.  -In the past four weeks, has the patient had:  Daytime asthma symptoms more than twice weekly?   No Any instance of night waking due to asthma?   No Utilized albuterol reliever for symptoms more than twice weekly? No - less than 1x monthly  Any activity limitation due to asthma?    No -Exacerbations requiring treatment in last 6 months: Yes due to perfume exposure - April 2023  -Patient denies consistent use of maintenance  inhaler; only takes Breo PRN.  -Patient breathing symptoms improved significantly since perfume exacerbation. Patient is back to using her Breo as needed. Discussed potential benefits of switching to faster acting maintenance inhaler such as Symbicort for asthma symptoms, patient was amenable.  -Recommend stopping albuterol, Breo  -Recommend starting Symbicort 80-4.5 mcg/act 2 puffs twice daily as needed   Depression/Anxiety (Goal: Maintain stable mood) -Controlled -Current treatment: Escitalopram 10 mg daily (AM)  -Medications previously tried/failed: NA -PHQ9: 0 -GAD7: 0 -Connected with NA for mental health support -Educated on Benefits of medication for symptom control -Recommended to continue current medication  Osteopenia (Goal Prevent fractures) -Uncontrolled -Last DEXA Scan: 11/18/21   T-Score femoral neck: -1.5  T-Score total hip: -0.7  T-Score lumbar spine: -1.7  T-Score forearm radius: NA  10-year probability of major osteoporotic fracture: 3.5%  10-year probability of hip fracture: 0.5% -Patient is not a candidate for pharmacologic treatment -Current treatment  Calcium + D3 daily  -Medications previously tried: None -Continue current medications    GERD (Goal: Prevent heartburn / reflux) -Controlled -Current treatment  Pantoprazole 20 mg daily (bedtime)  -Medications previously tried: NA  -Avoids pizza at night  -Counseled on trigger avoidance, not laying down after meals -Recommended to continue current medication  Chronic Pain (Goal: Minimize pain and maintain quality of life) -Controlled -Current treatment  Acetaminophen 500 mg 2 tablets  Voltaren 1 % gel  Pregabalin 100 mg twice daily  -Medications previously tried: NA -Counseled to limit acetaminophen use to < 3000 mg daily.   -Recommended to continue current medication   Patient Goals/Self-Care Activities Patient will:  - focus on medication adherence by utilizing [facility]  services check blood pressure weekly, document, and provide at future appointments  Follow Up Plan: Telephone follow up appointment with care management team member scheduled for:  10/29/2022 at 11:00 AM    Patient agreed to services and verbal consent obtained.   Patient verbalizes understanding of instructions and care plan provided today and agrees to view in [place]. Active MyChart status and patient understanding of how to access instructions and care plan via MyChart confirmed with patient.     Malva Limes, [place] Pharmacist Practitioner  [facility] [phone]

## 2022-07-09 NOTE — Progress Notes (Signed)
Chronic Care Management Pharmacy Note  04/30/8881 Name:  Molly [name] MRN:  [id number] DOB:  08-16-1951  Summary: Patient presents for CCM follow-up.   -Patient breathing symptoms improved significantly since perfume exacerbation. Patient is back to using her Breo as needed. Discussed potential benefits of switching to faster acting maintenance inhaler such as Symbicort for asthma symptoms, patient was amenable.   Recommendations/Changes made from today's visit: -Recommend stopping albuterol, Breo  -Recommend starting Symbicort 80-4.5 mcg/act 2 puffs twice daily as needed   Plan: CPP follow-up 3 months   Recommended Problem List Changes:  Add: Osteopenia of multiple sites  Subjective: Molly [name] is an 71 y.o. year old female who is a primary patient of Steele Sizer, MD.  The CCM team was consulted for assistance with disease management and care coordination needs.    Engaged with patient by telephone for follow up visit in response to provider referral for pharmacy case management and/or care coordination services.   Consent to Services:  The patient was given information about Chronic Care Management services, agreed to services, and gave verbal consent prior to initiation of services.  Please see initial visit note for detailed documentation.   Patient Care Team: Steele Sizer, MD as PCP - General (Family Medicine) Kate Sable, MD as PCP - Cardiology (Cardiology) Rubie Maid, MD as Consulting Physician (Obstetrics and Gynecology) Clent Jacks, RN as Registered Nurse Noreene Filbert, MD as Referring Physician (Radiation Oncology) Sindy Guadeloupe, MD as Consulting Physician (Oncology) Mellody Drown, MD as Referring Physician (Obstetrics) Germaine Pomfret, [facility] (Pharmacist) Neldon Labella, RN as Case Manager Sunday Corn, Mervin Kung (Neurology) Ventura Sellers, MD as Consulting Physician (Oncology)  Recent office visits: 05/22/22: Patient  presented to Clemetine Marker, LPN.  1/50/56: Patient presented to Dr. Ancil Boozer for asthma exacerbation. Prednisone + Tussionex.  03/19/22: Patient presented to Dr. Ky Barban for URI.   Recent consult visits: 04/23/22: Patient presented to Dr. Vaslow(Oncology) 02/17/22: Patient presented to Beckey Rutter, NP (Oncology) 02/03/22: Patient presented to Dr. Posey Pronto (rheumatology)   Hospital visits:  Objective:  Lab Results  Component Value Date   CREATININE 0.71 02/17/2022   BUN 18 02/17/2022   GFRNONAA >60 02/17/2022   GFRAA 81 11/07/2020   NA 136 02/17/2022   K 3.8 02/17/2022   CALCIUM 9.3 02/17/2022   CO2 26 02/17/2022   GLUCOSE 107 (H) 02/17/2022    Lab Results  Component Value Date/Time   HGBA1C 6.2 (H) 04/16/2022 01:52 PM   HGBA1C 6.4 (H) 08/26/2021 11:59 AM    Last diabetic Eye exam: No results found for: "HMDIABEYEEXA"  Last diabetic Foot exam: No results found for: "HMDIABFOOTEX"   Lab Results  Component Value Date   CHOL 196 04/16/2022   HDL 68 04/16/2022   LDLCALC 105 (H) 04/16/2022   TRIG 136 04/16/2022   CHOLHDL 2.9 04/16/2022       Latest Ref Rng & Units 02/17/2022   12:55 PM 07/30/2021    9:40 AM 01/29/2021    9:43 AM  Hepatic Function  Total Protein 6.5 - 8.1 g/dL 7.5  7.2  7.4   Albumin 3.5 - 5.0 g/dL 4.1  3.9  4.1   AST 15 - 41 U/L _0 ALT 0 - 44 U/L _1 Alk Phosphatase 38 - 126 U/L 73  67  77   Total Bilirubin 0.3 - 1.2 mg/dL 0.2  0.4  0.7     Lab Results  Component Value Date/Time   TSH 1.47 01/23/2017 08:29 AM       Latest Ref Rng & Units 02/17/2022   12:55 PM 07/30/2021    9:40 AM 01/29/2021    9:43 AM  CBC  WBC 4.0 - 10.5 K/uL 5.0  7.7  5.3   Hemoglobin 12.0 - 15.0 g/dL 12.0  11.8  12.0   Hematocrit 36.0 - 46.0 % 36.7  36.2  35.7   Platelets 150 - 400 K/uL 296  330  275     Lab Results  Component Value Date/Time   VD25OH 38 01/23/2017 08:29 AM    Clinical ASCVD: Yes  The ASCVD Risk score (Arnett DK, et al., 2019) failed to  calculate for the following reasons:   The patient has a prior MI or stroke diagnosis       05/22/2022    3:02 PM 04/23/2022   11:21 AM 04/16/2022    1:19 PM  Depression screen PHQ 2/9  Decreased Interest 0 0 0  Down, Depressed, Hopeless 0 0 0  PHQ - 2 Score 0 0 0  Altered sleeping   0  Tired, decreased energy   0  Change in appetite   0  Feeling bad or failure about yourself    0  Trouble concentrating   0  Moving slowly or fidgety/restless   0  Suicidal thoughts   0  PHQ-9 Score   0     Social History   Tobacco Use  Smoking Status Never  Smokeless Tobacco Never  Tobacco Comments   smoking cessation materials not required   BP Readings from Last 3 Encounters:  04/16/22 126/78  04/04/22 137/83  02/17/22 (!) 146/69   Pulse Readings from Last 3 Encounters:  04/16/22 77  04/04/22 72  02/17/22 65   Wt Readings from Last 3 Encounters:  04/23/22 228 lb (103.4 kg)  04/16/22 228 lb (103.4 kg)  04/04/22 229 lb 6.4 oz (104.1 kg)   BMI Readings from Last 3 Encounters:  04/23/22 46.05 kg/m  04/16/22 46.05 kg/m  04/04/22 46.33 kg/m    Assessment/Interventions: Review of patient past medical history, allergies, medications, health status, including review of consultants reports, laboratory and other test data, was performed as part of comprehensive evaluation and provision of chronic care management services.   SDOH:  (Social Determinants of Health) assessments and interventions performed: Yes    SDOH Screenings   Alcohol Screen: Low Risk  (05/22/2022)   Alcohol Screen    Last Alcohol Screening Score (AUDIT): 0  Depression (PHQ2-9): Low Risk  (05/22/2022)   Depression (PHQ2-9)    PHQ-2 Score: 0  Financial Resource Strain: Low Risk  (05/22/2022)   Overall Financial Resource Strain (CARDIA)    Difficulty of Paying Living Expenses: Not hard at all  Food Insecurity: No Food Insecurity (05/22/2022)   Hunger Vital Sign    Worried About Running Out of Food in the Last  Year: Never true    Ran Out of Food in the Last Year: Never true  Housing: Low Risk  (05/22/2022)   Housing    Last Housing Risk Score: 0  Physical Activity: Inactive (05/22/2022)   Exercise Vital Sign    Days of Exercise per Week: 0 days    Minutes of Exercise per Session: 0 min  Social Connections: Moderately Integrated (05/22/2022)   Social Connection and Isolation Panel [NHANES]    Frequency of Communication with Friends and Family: More than three times a week    Frequency of  Social Gatherings with Friends and Family: Three times a week    Attends Religious Services: More than 4 times per year    Active Member of Clubs or Organizations: Yes    Attends Archivist Meetings: More than 4 times per year    Marital Status: Divorced  Stress: No Stress Concern Present (05/22/2022)   [place]    Feeling of Stress : Not at all  Tobacco Use: Low Risk  (05/22/2022)   Patient History    Smoking Tobacco Use: Never    Smokeless Tobacco Use: Never    Passive Exposure: Not on file  Transportation Needs: No Transportation Needs (05/22/2022)   PRAPARE - Transportation    Lack of Transportation (Medical): No    Lack of Transportation (Non-Medical): No    CCM Care Plan  Allergies  Allergen Reactions   Ferumoxytol Anaphylaxis   Liraglutide Other (See Comments)    pancreatitis   Nsaids Hives, Rash and Nausea And Vomiting   Clopidogrel Other (See Comments)    Reports intolerance and states causes myalgias   Statins Other (See Comments) and Nausea And Vomiting    Joint pains   Oxycodone Nausea And Vomiting   Crestor [Rosuvastatin Calcium] Rash   Zetia [Ezetimibe] Other (See Comments)    Muscle weakness and joint pain    Medications Reviewed Today     Reviewed by Clemetine Marker, LPN (Licensed Practical Nurse) on 05/22/22 at [place] List Status: <None>   Medication Order Taking? Sig Documenting Provider Last Dose  Status Informant  acetaminophen (TYLENOL) 500 MG tablet [id number] Yes Take 2 tablets (1,000 mg total) by mouth every 6 (six) hours as needed for mild pain or moderate pain. Rubie Maid, MD Taking Active Self  albuterol (VENTOLIN HFA) 108 (90 Base) MCG/ACT inhaler [id number] Yes Inhale 2 puffs into the lungs every 4 (four) hours as needed for wheezing or shortness of breath. Steele Sizer, MD Taking Active   Calcium Carb-Cholecalciferol (CALCIUM 1000 + D PO) [id number] Yes Take by mouth. [provider] Taking Active Self  cetirizine (ZYRTEC ALLERGY) 10 MG tablet [id number] Yes Take 1 tablet (10 mg total) by mouth daily. Bo Merino, FNP Taking Active   chlorpheniramine-HYDROcodone [facility] ER) 10-8 MG/5ML [id number] Yes Take 5 mLs by mouth every 12 (twelve) hours as needed. Steele Sizer, MD Taking Active   clopidogrel (PLAVIX) 75 MG tablet [id number] Yes Take 75 mg by mouth daily. [provider] Taking Active   diclofenac Sodium (VOLTAREN) 1 % GEL [id number] Yes Apply 4 grams to the skin 4 times daily. Need appt. Steele Sizer, MD Taking Active   escitalopram (LEXAPRO) 10 MG tablet [id number] Yes Take 1 tablet (10 mg total) by mouth daily. Steele Sizer, MD Taking Active   Evolocumab [facility] SURECLICK) 884 MG/ML Darden Palmer [id number] Yes Inject 140 mg into the skin every 14 (fourteen) days. Steele Sizer, MD Taking Active   fluticasone furoate-vilanterol (BREO ELLIPTA) 100-25 MCG/ACT AEPB [id number] Yes Inhale 1 puff into the lungs daily. Myles Gip, DO Taking Active   furosemide (LASIX) 40 MG tablet [id number] Yes TAKE ONE TABLET BY MOUTH EVERY Jessee Avers, MD Taking Active            Med Note Ivor Costa, TIFFANY S   Mon Feb 17, 2022  1:13 PM) As needed  ketoconazole (NIZORAL) 2 % cream [id number] Yes APPLY TO THE AFFECTED AREA(S) of abdominal fold ONCE daily AS NEEDED Steele Sizer, MD Taking  Active   olmesartan-hydrochlorothiazide (BENICAR HCT)  20-12.5 MG tablet [id number] Yes Take 1 tablet by mouth daily. Steele Sizer, MD Taking Active   pantoprazole (PROTONIX) 20 MG tablet [id number] Yes Take 1 tablet (20 mg total) by mouth daily. Steele Sizer, MD Taking Active   potassium chloride SA (KLOR-CON M) 20 MEQ tablet [id number] Yes TAKE ONE TABLET BY MOUTH TWICE DAILY Ancil Boozer, Drue Stager, MD Taking Active   pregabalin (LYRICA) 100 MG capsule [id number] Yes Take 1 capsule (100 mg total) by mouth 2 (two) times daily. 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 ([place]) 01/11/2019   Iron deficiency anemia 11/15/2018   Endometrial adenocarcinoma ([place]) 67/67/2094   Umbilical hernia without obstruction and without gangrene    Atherosclerosis of right coronary artery 11/10/2017   Atherosclerosis of abdominal aorta ([place]) 11/10/2017   Prediabetes 10/02/2017   Perennial allergic rhinitis with seasonal variation 05/14/2016   Asthma, mild intermittent, well-controlled 05/14/2016   Hypertension goal BP (blood pressure) < 140/90 12/10/2015   Cardiomyopathy due to hypertension ([place]) 12/10/2015   History of CVA (cerebrovascular accident) 12/10/2015   Hyperlipidemia LDL goal <70 12/10/2015   Statin intolerance 12/10/2015    Immunization History  Administered Date(s) Administered   Fluad Quad(high Dose 65+) 10/05/2019, 10/15/2020   Influenza, High Dose Seasonal PF 11/11/2016, 10/02/2017, 09/07/2018, 08/26/2021   Pneumococcal Conjugate-13 04/06/2018   Pneumococcal Polysaccharide-23 11/11/2016   Tdap 11/11/2016    Conditions to be addressed/monitored:  Hypertension, Hyperlipidemia, Coronary Artery  Disease, Asthma, Depression and Allergic Rhinitis  Care Plan : General Pharmacy (Adult)  Updates made by Germaine Pomfret, RPH since 07/09/2022 12:00 AM     Problem: Hypertension, Hyperlipidemia, Coronary Artery Disease, Asthma, Depression and Allergic Rhinitis   Priority: High     Long-Range Goal: Patient-Specific Goal   Start Date: 04/03/2021  Expected End Date: 11/01/2022  This Visit's Progress: On track  Recent Progress: On track  Priority: High  Note:    Current Barriers:  Unable to independently monitor therapeutic efficacy Unable to achieve control of Cholesterol   Pharmacist Clinical Goal(s):  Patient will achieve control of blood pressure as evidenced by Home BP less than 140/90 maintain control of cholesterol as evidenced by LDL less than 70  through collaboration with PharmD and provider.   Interventions: 1:1 collaboration with Steele Sizer, MD regarding development and update of comprehensive plan of care as evidenced by provider attestation and co-signature Inter-disciplinary care team collaboration (see longitudinal plan of care) Comprehensive medication review performed; medication list updated in electronic medical record  Hypertension (BP goal <140/90) -Controlled -Current treatment: Furosemide 40 mg daily Olmesartan-HCTZ 20-12.5 mg daily -Medications previously tried: NA  -Current home readings: NA -Denies hypotensive/hypertensive symptoms -Educated on Daily salt intake goal < 2300 mg; Exercise goal of 150 minutes per week; Importance of home blood pressure monitoring; -Counseled to monitor BP at home weekly, document, and provide log at future appointments -Recommended to continue current medication  Hyperlipidemia: (LDL goal < 70) -History of TIA  -Uncontrolled -Current treatment: Repatha 140 mg every 14 days: Appropriate, Effective, Safe, Accessible -Current antiplatelet treatment: Clopidogrel 75 mg daily (evening): Appropriate, Effective,  Safe, Accessible  -Medications previously tried: Statins, Aspirin -Current dietary habits: cheerios, baked chicken, salad. Steak <1x monthly  -Continue current medications  Asthma (Goal: control symptoms and prevent exacerbations) -Not ideally controlled -Current treatment  Ventolin HFA 2 puffs every 4 hours as needed Breo 1 puff daily as needed  -Current allergic rhinitis treatment  Cetirizine 10 mg daily -Medications previously tried: NA  -Allergies normally worse in the fall.  -In the past four weeks, has the patient had:  Daytime asthma symptoms more than twice weekly?   No Any instance of night waking due to asthma?   No Utilized albuterol reliever for symptoms more than twice weekly? No - less than 1x monthly  Any activity limitation due to asthma?    No -Exacerbations requiring treatment in last 6 months: Yes due to perfume exposure - April 2023  -Patient denies consistent use of maintenance inhaler; only takes Breo PRN.  -Patient breathing symptoms improved significantly since perfume exacerbation. Patient is back to using her Breo as needed. Discussed potential benefits of switching to faster acting maintenance inhaler such as Symbicort for asthma symptoms, patient was amenable.  -Recommend stopping albuterol, Breo  -Recommend starting Symbicort 80-4.5 mcg/act 2 puffs twice daily as needed   Depression/Anxiety (Goal: Maintain stable mood) -Controlled -Current treatment: Escitalopram 10 mg daily (AM)  -Medications previously tried/failed: NA -PHQ9: 0 -GAD7: 0 -Connected with NA for mental health support -Educated on Benefits of medication for symptom control -Recommended to continue current medication  Osteopenia (Goal Prevent fractures) -Uncontrolled -Last DEXA Scan: 11/18/21   T-Score femoral neck: -1.5  T-Score total hip: -0.7  T-Score lumbar spine: -1.7  T-Score forearm radius: NA  10-year probability of major osteoporotic fracture: 3.5%  10-year probability of  hip fracture: 0.5% -Patient is not a candidate for pharmacologic treatment -Current treatment  Calcium + D3 daily  -Medications previously tried: None -Continue current medications    GERD (Goal: Prevent heartburn / reflux) -Controlled -Current treatment  Pantoprazole 20 mg daily (bedtime)  -Medications previously tried: NA  -Avoids pizza at night  -Counseled on trigger avoidance, not laying down after meals -Recommended to continue current medication  Chronic Pain (Goal: Minimize pain and maintain quality of life) -Controlled -Current treatment  Acetaminophen 500 mg 2 tablets  Voltaren 1 % gel  Pregabalin 100 mg twice daily  -Medications previously tried: NA -Counseled to limit acetaminophen use to < 3000 mg daily.   -Recommended to continue current medication   Patient Goals/Self-Care Activities Patient will:  - focus on medication adherence by utilizing [facility] services check blood pressure weekly, document, and provide at future appointments  Follow Up Plan: Telephone follow up appointment with care management team member scheduled for:  10/29/2022 at 11:00 AM    Medication Assistance: None required.  Patient affirms current coverage meets needs.  Patient's preferred pharmacy is:  [facility] - [place], [place] - [address] [place] [address] Phone: [phone] Fax: [phone]   Patient decided to: Utilize [facility] for medication synchronization, packaging and delivery  Care Plan and Follow Up Patient Decision:  Patient agrees to Care Plan and Follow-up.  Plan: Telephone follow up appointment with care management team member scheduled for:  10/29/2022 at 11:00 AM  Malva Limes, [place] Pharmacist Practitioner  [facility] [phone]

## 2022-07-22 NOTE — Progress Notes (Unsigned)
GYNECOLOGY PROGRESS NOTE  Subjective:    Patient ID: Molly [name], female    DOB: 24-Feb-1951, 71 y.o.   MRN: [id number]  HPI  Patient is a 71 y.o. G39P2002 female who presents for follow up of endometrial cancer She underwent a total laparoscopic hysterectomy with bilateral salpingo-oophorectomy with pelvic lymph node dissection on 10/06/2018. Doing well, no issues.  Note that she feels like her energy levels have improved.    GYN History:  Last colonoscopy:  07/15/2021.  Results were: Normal.  Last mammogram: 11/18/2021.  Results were:  Normal Last pap: 09/07/2018.  Results were: Unsatisfactory   The following portions of the patient's history were reviewed and updated as appropriate:  She  has a past medical history of Anxiety, Asthma, Cardiomyopathy ([place]), Complication of anesthesia, COVID-19 virus infection (09/08/2019), Depression, Endometrial cancer ([place]) (08/2018), GERD (gastroesophageal reflux disease), History of CVA (cerebrovascular accident) (12/10/2015), Hyperlipidemia LDL goal <70 (12/10/2015), Hypertension, Prediabetes (10/02/2017), and Stroke ([place]) (1990).  She  has a past surgical history that includes Colonoscopy with propofol (N/A, 01/08/2017); Umbilical hernia repair (N/A, 11/17/2017); Colonoscopy with propofol (N/A, 05/05/2018); Hernia repair (10/2017); Cholecystectomy; Sentinel node biopsy (N/A, 10/06/2018); Abdominal hysterectomy; Portacath placement (N/A, 11/04/2018); and Colonoscopy with propofol (N/A, 07/15/2021).  Her family history includes Alzheimer's disease in her brother, brother, and mother; Asthma in her father; Cancer in her paternal grandmother; Dementia in her father and mother; Gout in her father; Healthy in her brother, brother, brother, daughter, sister, sister, and sister; Hypertension in her brother, brother, brother, daughter, father, mother, sister, and sister; Stroke in her brother, brother, brother, brother, and mother.  She  reports that she has never  smoked. She has never used smokeless tobacco. She reports that she does not drink alcohol and does not use drugs.   Current Outpatient Medications on File Prior to Visit  Medication Sig Dispense Refill   acetaminophen (TYLENOL) 500 MG tablet Take 2 tablets (1,000 mg total) by mouth every 6 (six) hours as needed for mild pain or moderate pain. 30 tablet 0   albuterol (VENTOLIN HFA) 108 (90 Base) MCG/ACT inhaler Inhale 2 puffs into the lungs every 4 (four) hours as needed for wheezing or shortness of breath. 8.5 each 11   Calcium Carb-Cholecalciferol (CALCIUM 1000 + D PO) Take by mouth.     cetirizine (ZYRTEC ALLERGY) 10 MG tablet Take 1 tablet (10 mg total) by mouth daily. 30 tablet 1   chlorpheniramine-HYDROcodone (TUSSIONEX PENNKINETIC ER) 10-8 MG/5ML Take 5 mLs by mouth every 12 (twelve) hours as needed. 140 mL 0   clopidogrel (PLAVIX) 75 MG tablet Take 75 mg by mouth daily.     diclofenac Sodium (VOLTAREN) 1 % GEL Apply 4 grams to the skin 4 times daily. Need appt. 300 g 0   escitalopram (LEXAPRO) 10 MG tablet Take 1 tablet (10 mg total) by mouth daily. 30 tablet 5   Evolocumab (REPATHA SURECLICK) 384 MG/ML SOAJ Inject 140 mg into the skin every 14 (fourteen) days. 2 mL 11   fluticasone furoate-vilanterol (BREO ELLIPTA) 100-25 MCG/ACT AEPB Inhale 1 puff into the lungs daily. 1 each 11   furosemide (LASIX) 40 MG tablet TAKE ONE TABLET BY MOUTH EVERY MORNING 90 tablet 0   ketoconazole (NIZORAL) 2 % cream APPLY TO THE AFFECTED AREA(S) of abdominal fold ONCE daily AS NEEDED 60 g 0   olmesartan-hydrochlorothiazide (BENICAR HCT) 20-12.5 MG tablet Take 1 tablet by mouth daily. 30 tablet 11   pantoprazole (PROTONIX) 20 MG tablet Take 1  tablet (20 mg total) by mouth daily. 30 tablet 11   potassium chloride SA (KLOR-CON M) 20 MEQ tablet Take 1 tablet (20 mEq total) by mouth 2 (two) times daily. 180 tablet 1   pregabalin (LYRICA) 100 MG capsule Take 1 capsule (100 mg total) by mouth 2 (two) times daily.  60 capsule 5   No current facility-administered medications on file prior to visit.   She is allergic to ferumoxytol, liraglutide, nsaids, clopidogrel, statins, oxycodone, crestor [rosuvastatin calcium], and zetia [ezetimibe]..  Review of Systems Pertinent items are noted in HPI.   Objective:   Blood pressure (!) 148/76, pulse 60, height '4\' 11"'$  (1.499 m), weight 234 lb 3.2 oz (106.2 kg). Body mass index is 47.3 kg/m. General appearance: alert, cooperative, and no distress Abdomen: soft, non-tender; bowel sounds normal; no masses,  no organomegaly Pelvic: external genitalia normal, rectovaginal septum normal.  Vagina without discharge.  Uterus, cervix, and ovaries surgically absent.  Extremities: extremities normal, atraumatic, no cyanosis or edema Lymph nodes: Axillary adenopathy: none, Supraclavicular adenopathy: none, and Inguinal adenopathy: none Neurologic: Grossly normal   Assessment:   1. History of endometrial cancer   2. History of hysterectomy      Plan:   Patient remains without evidence of recurrence. Overall doing well. To continue her routine q 6 month surveillance alternating with GYN Onc.  Will be 5 years cancer free next year and can reduce to q yearly screens.    Rubie Maid, MD [facility]

## 2022-07-23 ENCOUNTER — Ambulatory Visit (INDEPENDENT_AMBULATORY_CARE_PROVIDER_SITE_OTHER): Payer: 59 | Admitting: Obstetrics and Gynecology

## 2022-07-23 ENCOUNTER — Encounter: Payer: Self-pay | Admitting: Obstetrics and Gynecology

## 2022-07-23 VITALS — BP 148/76 | HR 60 | Ht 59.0 in | Wt 234.2 lb

## 2022-07-23 DIAGNOSIS — Z8542 Personal history of malignant neoplasm of other parts of uterus: Secondary | ICD-10-CM | POA: Diagnosis not present

## 2022-07-23 DIAGNOSIS — Z90710 Acquired absence of both cervix and uterus: Secondary | ICD-10-CM | POA: Diagnosis not present

## 2022-07-28 DIAGNOSIS — J4520 Mild intermittent asthma, uncomplicated: Secondary | ICD-10-CM

## 2022-07-28 DIAGNOSIS — E785 Hyperlipidemia, unspecified: Secondary | ICD-10-CM

## 2022-08-01 ENCOUNTER — Telehealth: Payer: Self-pay

## 2022-08-01 NOTE — Progress Notes (Signed)
Chronic Care Management Pharmacy Assistant   Name: Molly [name]  MRN: [id number] DOB: [birth year]  Reason for Encounter: Medication Review/Medication Coordination Call.   Recent office visits:  No recent office visit  Recent consult visits:  07/23/2022 Dr. Marcelline Mates MD (OBGYN) No medication Changes noted, Return in  1 year.  Hospital visits:  None in previous 6 months  Medications: Outpatient Encounter Medications as of 08/01/2022  Medication Sig Note   acetaminophen (TYLENOL) 500 MG tablet Take 2 tablets (1,000 mg total) by mouth every 6 (six) hours as needed for mild pain or moderate pain.    albuterol (VENTOLIN HFA) 108 (90 Base) MCG/ACT inhaler Inhale 2 puffs into the lungs every 4 (four) hours as needed for wheezing or shortness of breath.    Calcium Carb-Cholecalciferol (CALCIUM 1000 + D PO) Take by mouth.    cetirizine (ZYRTEC ALLERGY) 10 MG tablet Take 1 tablet (10 mg total) by mouth daily.    chlorpheniramine-HYDROcodone (TUSSIONEX PENNKINETIC ER) 10-8 MG/5ML Take 5 mLs by mouth every 12 (twelve) hours as needed.    clopidogrel (PLAVIX) 75 MG tablet Take 75 mg by mouth daily.    diclofenac Sodium (VOLTAREN) 1 % GEL Apply 4 grams to the skin 4 times daily. Need appt.    escitalopram (LEXAPRO) 10 MG tablet Take 1 tablet (10 mg total) by mouth daily.    Evolocumab (REPATHA SURECLICK) 127 MG/ML SOAJ Inject 140 mg into the skin every 14 (fourteen) days.    fluticasone furoate-vilanterol (BREO ELLIPTA) 100-25 MCG/ACT AEPB Inhale 1 puff into the lungs daily.    furosemide (LASIX) 40 MG tablet TAKE ONE TABLET BY MOUTH EVERY MORNING 02/17/2022: As needed   ketoconazole (NIZORAL) 2 % cream APPLY TO THE AFFECTED AREA(S) of abdominal fold ONCE daily AS NEEDED    olmesartan-hydrochlorothiazide (BENICAR HCT) 20-12.5 MG tablet Take 1 tablet by mouth daily.    pantoprazole (PROTONIX) 20 MG tablet Take 1 tablet (20 mg total) by mouth daily.    potassium chloride SA (KLOR-CON M) 20 MEQ tablet  Take 1 tablet (20 mEq total) by mouth 2 (two) times daily.    pregabalin (LYRICA) 100 MG capsule Take 1 capsule (100 mg total) by mouth 2 (two) times daily.    No facility-administered encounter medications on file as of 08/01/2022.    Care Gaps: COVID Vaccine Shingrix Vaccine Influenza Vaccine     Star Rating Drugs: Olmesartan-HCTZ 20-12.5 mg last filled on 07/10/2022 for 30 day supply at [facility].   Medication Fill Gaps: None  Reviewed chart for medication changes ahead of medication coordination call.  BP Readings from Last 3 Encounters:  07/23/22 (!) 148/76  04/16/22 126/78  04/04/22 137/83    Lab Results  Component Value Date   HGBA1C 6.2 (H) 04/16/2022     Patient obtains medications through Adherence Packaging  30 Days   Last adherence delivery included:  Escitalopram 10 mg one tablet daily-  Breakfast   Pregabalin 100 mg one Capsule two times daily- Breakfast,Evening meals    Olmesartan-HCTZ 20-12.5 mg one tablet daily -Breakfast   Potassium Chloride 20 MEQ 1 tablet  two times daily-Breakfast, evening meals   Pantoprazole  20 mg one tablet daily- Breakfast    Repatha 140 mg Inject 140 mg into the skin every 14 (fourteen) days   Calcium/Vitamin D3 315-250 once daily- Breakfast  Patient declined medication last month: Ventolin HFA 108 MCG/ACT -PRN    Ketoconazole 2% cream PRN    Breo 100-25 MCG/ Inhaler -  1 puff daily  (Patient states she really does not use Breo inhaler as much only PRN - last filled 03/20/2022 for 30 DS)   Levocetirizine 5 mg one tablet daily - evening meals (Adequate supply)   Furosemide 40 mg PRN (Vials, patient states her provider inform her  to take PRN)   Coenzyme Q10 one capsule Daily (OTC)  Patient is due for next adherence delivery on: 08/13/2022. Called patient and reviewed medications and coordinated delivery.  This delivery to include: Escitalopram 10 mg one tablet daily-  Breakfast   Pregabalin 100 mg one  Capsule two times daily- Breakfast,Evening meals    Olmesartan-HCTZ 20-12.5 mg one tablet daily -Breakfast   Potassium Chloride 20 MEQ 1 tablet  two times daily-Breakfast, evening meals   Pantoprazole  20 mg one tablet daily- Breakfast    Repatha 140 mg Inject 140 mg into the skin every 14 (fourteen) days   Calcium/Vitamin D3 315-250 once daily- Breakfast  Ventolin HFA 108 MCG/ACT -PRN   Breo 100-25 MCG/ Inhaler - 1 puff daily     Patient declined the following medications : Ketoconazole 2% cream PRN    Levocetirizine 5 mg one tablet daily - evening meals (Adequate supply)   Furosemide 40 mg PRN (Vials, patient states her provider inform her  to take PRN)   Coenzyme Q10 one capsule Daily (OTC)  Patient needs refills for None ID.  Confirmed delivery date of 08/13/2022 ([facility]) , advised patient that pharmacy will contact them the morning of delivery.  [place] Pharmacist Assistant [phone]

## 2022-08-04 ENCOUNTER — Ambulatory Visit: Payer: Self-pay

## 2022-08-04 NOTE — Chronic Care Management (AMB) (Signed)
   03/02/2480  Molly [name] [birth year] [id number]  Documentation encounter created to complete case transition. The care management team will continue to follow for care coordination.  [place] Management [phone]

## 2022-08-12 ENCOUNTER — Other Ambulatory Visit: Payer: Self-pay | Admitting: Family Medicine

## 2022-08-12 DIAGNOSIS — K219 Gastro-esophageal reflux disease without esophagitis: Secondary | ICD-10-CM

## 2022-08-20 ENCOUNTER — Inpatient Hospital Stay: Payer: 59 | Attending: Obstetrics and Gynecology | Admitting: Obstetrics and Gynecology

## 2022-08-20 ENCOUNTER — Encounter: Payer: Self-pay | Admitting: Obstetrics and Gynecology

## 2022-08-20 VITALS — BP 168/73 | HR 58 | Temp 98.7°F | Resp 20 | Wt 239.8 lb

## 2022-08-20 DIAGNOSIS — Z9221 Personal history of antineoplastic chemotherapy: Secondary | ICD-10-CM | POA: Diagnosis not present

## 2022-08-20 DIAGNOSIS — Z90710 Acquired absence of both cervix and uterus: Secondary | ICD-10-CM | POA: Insufficient documentation

## 2022-08-20 DIAGNOSIS — C541 Malignant neoplasm of endometrium: Secondary | ICD-10-CM

## 2022-08-20 DIAGNOSIS — Z923 Personal history of irradiation: Secondary | ICD-10-CM | POA: Insufficient documentation

## 2022-08-20 DIAGNOSIS — Z8542 Personal history of malignant neoplasm of other parts of uterus: Secondary | ICD-10-CM | POA: Diagnosis present

## 2022-08-20 DIAGNOSIS — Z90722 Acquired absence of ovaries, bilateral: Secondary | ICD-10-CM | POA: Insufficient documentation

## 2022-08-20 NOTE — Progress Notes (Signed)
Gynecologic Oncology Interval Visit   Referring Provider: Dr. Marcelline Mates  Chief Complaint: FIGO Stage IIIc1 grade 2 endometrioid endometrial cancer  Subjective:  Molly [name] is a 71 y.o. female, initially seen in consultation for Dr. Marcelline Mates, grade 2 endometrioid endometrial cancer, s/p TLH-BSO with SLN mapping and biopsies on 10/06/18 with Dr. Fransisca Connors and Dr. Marcelline Mates at [facility], followed by chemotherapy and radiation with vaginal cuff boost, who returns to clinic today for continued surveillance.   CA 125 has been followed by Dr. Janese Banks.  02/04/2019 11.4 07/18/2019 10.2 10/05/2019 9.6 07/13/2020 10.1 01/29/21  13 07/30/21  11.  02/17/22 11  She continues to feel well. Neuropathy is stable. She continues lyrica.   Gynecologic Oncology History:  She has history of Grade 3 uterine prolapse with Grade 1 cystocele and rectocele. She noted pessary had become dislodged after coughing d/t pneumonia and presented to Dr. Marcelline Mates for evaluation 05/2018. At that time she noted post-menopausal bleeding and cramping and has history of cervical polyps. Bleeding was felt to be possibly related to pessary.   Ultrasound revealed: uterus measuring 10.2 x 5.5 x 5.7 cm, heterogenous with evidence of fibroids invading endometrium measuring 2.9 x 3.3 x 3.6 cm and two additional fibroids measuring 2.0 x 2.1 x 1.8, and 2.9 x 3.3 x 3.6. Endometrium measuring 4.7 mm. Neither ovary was visualized.   She had second episode of bleeding 08/31/18-09/04/18, felt to be heavier and more 'period like'. Endometrial biopsy was performed. Pathology: - Endometrioid adenocarcinoma, figo grade 1.   Pathology:  DIAGNOSIS:  A. UTERUS WITH CERVIX AND BILATERAL FALLOPIAN TUBES AND OVARIES; TOTAL HYSTERECTOMY WITH BILATERAL SALPINGO-OOPHORECTOMY:  - ENDOMETRIAL ADENOCARCINOMA.  - SEE CANCER SUMMARY BELOW.  - CERVIX WITH NABOTHIAN CYSTS, CHRONIC CERVICITIS WITH SQUAMOUS METAPLASIA, AND 0.9 CM ENDOCERVICAL POLYP.  - INACTIVE BACKGROUND ENDOMETRIUM WITH  ENDOMETRIAL POLYP.  - MYOMETRIUM WITH ADENOMYOSIS AND LEIOMYOMATA, LARGEST MEASURING 4.5 CM.  - UNREMARKABLE FALLOPIAN TUBES AND OVARIES.   B.  SENTINEL LYMPH NODE, LEFT OBTURATOR; EXCISION:  - METASTATIC CARCINOMA INVOLVES ONE LYMPH NODE (1/1).   C.  SENTINEL LYMPH NODE, LEFT EXTERNAL ILIAC; EXCISION:  - LYMPHOID TISSUE NOT PRESENT.  - NEGATIVE FOR MALIGNANCY.   D.  SENTINEL LYMPH NODE, RIGHT EXTERNAL ILIAC; EXCISION:  - METASTATIC CARCINOMA INVOLVES ONE OF TWO LYMPH NODES (1/2).   LVSI present and atypical washings.  Invasion 6/18 and cervix and adnexa negative.   MLH1: LOSS of protein expression  MSH2: Intact nuclear expression  MSH6: Intact nuclear expression  PMS2: LOSS of protein expression   MLH1- positive methylation HER-2 negative  Pathologic Stage: FIGO stage IIIC1 grade 2  PET CT scan showed metastatic adenopathy and retroperitoneal and external iliac group of lymph nodes in the right side.  No evidence of metastatic disease elsewhere.  She received 6 cycles of carbo-taxol on 11/05/2018-02/25/2019. She completed radiation to pelvic and and periaortic nodes 03-31-2019-05/06/2019 with boost to vaginal cuff after 3rd cycle of chemotherapy. She completed on 06/15/2019.   She had a covid infection in September 2020. Port has been removed.   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 ([place]) 01/11/2019   Iron deficiency anemia 11/15/2018   Endometrial adenocarcinoma ([place]) 10/31/1593   Umbilical hernia  without obstruction and without gangrene    Atherosclerosis of right coronary artery 11/10/2017   Atherosclerosis of abdominal aorta ([place]) 11/10/2017    Prediabetes 10/02/2017   Perennial allergic rhinitis with seasonal variation 05/14/2016   Asthma, mild intermittent, well-controlled 05/14/2016   Hypertension goal BP (blood pressure) < 140/90 12/10/2015   Cardiomyopathy due to hypertension ([place]) 12/10/2015   History of CVA (cerebrovascular accident) 12/10/2015   Hyperlipidemia LDL goal <70 12/10/2015   Statin intolerance 12/10/2015    Past Medical History: Past Medical History:  Diagnosis Date   Anxiety    Asthma    history of asthma   Cardiomyopathy ([place])    Complication of anesthesia    tore hair out and made teeth rough   COVID-19 virus infection 09/08/2019   Depression    Endometrial cancer ([place]) 08/2018   GERD (gastroesophageal reflux disease)    takes prilosec prn   History of CVA (cerebrovascular accident) 12/10/2015   Hyperlipidemia LDL goal <70 12/10/2015   Hypertension    Prediabetes 10/02/2017   A1c 6 in January 2018   Stroke [facility]) 1990    no residual effects    Past Surgical History: Past Surgical History:  Procedure Laterality Date   ABDOMINAL HYSTERECTOMY     CHOLECYSTECTOMY     COLONOSCOPY WITH PROPOFOL N/A 01/08/2017   Procedure: COLONOSCOPY WITH PROPOFOL;  Surgeon: [name], MD;  Location: [facility] ENDOSCOPY;  Service: Endoscopy;  Laterality: N/A;   COLONOSCOPY WITH PROPOFOL N/A 05/05/2018   Procedure: COLONOSCOPY WITH PROPOFOL;  Surgeon: [name], MD;  Location[facility] ENDOSCOPY;  Service: Gastroenterology;  Laterality: N/A;   COLONOSCOPY WITH PROPOFOL N/A 07/15/2021   Procedure: COLONOSCOPY WITH PROPOFOL;  Surgeon: [name], MD;  Location: [facility] ENDOSCOPY;  Service: Gastroenterology;  Laterality: N/A;   HERNIA REPAIR  19/5093   umbilical   PORTACATH PLACEMENT N/A 11/04/2018   Procedure: INSERTION PORT-A-CATH;  Surgeon: Jules Husbands, MD;  Location: [facility] ORS;  Service: General;  Laterality: N/A;   SENTINEL NODE BIOPSY N/A 10/06/2018   Procedure: SENTINEL NODE BIOPSY;  Surgeon: Mellody Drown, MD;   Location: [facility] ORS;  Service: Gynecology;  Laterality: N/A;   UMBILICAL HERNIA REPAIR N/A 11/17/2017   Procedure: HERNIA REPAIR UMBILICAL ADULT;  Surgeon: Jules Husbands, MD;  Location: [facility] ORS;  Service: General;  Laterality: N/A;   Past Gynecologic History:  [id number] Denies abnormal pap smears.  History of OCP use.  Denies STD history  OB History:  OB History  Gravida Para Term Preterm AB Living  _0 SAB IAB Ectopic Multiple Live Births          2    # Outcome Date GA Lbr Len/2nd Weight Sex Delivery Anes PTL Lv  2 Term 1974    F Vag-Spont   LIV  1 Term 45    F Vag-Spont   LIV    Family History: Family History  Problem Relation Age of Onset   Stroke Mother    Hypertension Mother    Dementia Mother    Alzheimer's disease Mother    Gout Father    Asthma Father    Hypertension Father    Dementia Father    Healthy Sister    Stroke Brother    Alzheimer's disease Brother    Healthy Daughter    Hypertension Brother    Healthy Brother    Cancer Paternal 22    Healthy Sister    Healthy Sister    Hypertension  Sister    Hypertension Sister    Stroke Brother    Alzheimer's disease Brother    Stroke Brother    Hypertension Brother    Stroke Brother    Hypertension Brother    Healthy Brother    Healthy Brother    Hypertension Daughter    Breast cancer Neg Hx    Social History: Social History   Socioeconomic History   Marital status: Divorced    Spouse name: Not on file   Number of children: 2   Years of education: some college   Highest education level: 12th grade  Occupational History   Occupation: Retired    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   Occupation: currently a Theme park manager  Tobacco Use   Smoking status: Never   Smokeless tobacco: Never   Tobacco comments:    smoking cessation materials not required  Vaping Use   Vaping Use: Never used  Substance and Sexual Activity   Alcohol use: No    Alcohol/week: 0.0 standard drinks of  alcohol   Drug use: No   Sexual activity: Not Currently  Other Topics Concern   Not on file  Social History Narrative   Pt lives alone but currently staying with her daughter   Social Determinants of Health   Financial Resource Strain: [place]  (05/22/2022)   Overall Financial Resource Strain (CARDIA)    Difficulty of Paying Living Expenses: Not hard at all  Food Insecurity: No Food Insecurity (05/22/2022)   Hunger Vital Sign    Worried About Running Out of Food in the Last Year: Never true    [place] in the Last Year: Never true  Transportation Needs: No Transportation Needs (05/22/2022)   PRAPARE - Hydrologist (Medical): No    Lack of Transportation (Non-Medical): No  Physical Activity: Inactive (05/22/2022)   Exercise Vital Sign    Days of Exercise per Week: 0 days    Minutes of Exercise per Session: 0 min  Stress: No Stress Concern Present (05/22/2022)   [place]    Feeling of Stress : Not at all  Social Connections: Moderately Integrated (05/22/2022)   Social Connection and Isolation Panel [NHANES]    Frequency of Communication with Friends and Family: More than three times a week    Frequency of Social Gatherings with Friends and Family: Three times a week    Attends Religious Services: More than 4 times per year    Active Member of Clubs or Organizations: Yes    Attends Archivist Meetings: More than 4 times per year    Marital Status: Divorced  Intimate Partner Violence: Not At Risk (05/22/2022)   Humiliation, Afraid, Rape, and Kick questionnaire    Fear of Current or Ex-Partner: No    Emotionally Abused: No    Physically Abused: No    Sexually Abused: No    Allergies: Allergies  Allergen Reactions   Ferumoxytol Anaphylaxis   Liraglutide Other (See Comments)    pancreatitis   Nsaids Hives, Rash and Nausea And Vomiting   Clopidogrel Other (See  Comments)    Reports intolerance and states causes myalgias   Statins Other (See Comments) and Nausea And Vomiting    Joint pains   Oxycodone Nausea And Vomiting   Crestor [Rosuvastatin Calcium] Rash   Zetia [Ezetimibe] Other (See Comments)    Muscle weakness and joint pain    Current Medications:  Current Outpatient Medications  Medication Sig Dispense Refill   acetaminophen (TYLENOL) 500 MG tablet Take 2 tablets (1,000 mg total) by mouth every 6 (six) hours as needed for mild pain or moderate pain. 30 tablet 0   albuterol (VENTOLIN HFA) 108 (90 Base) MCG/ACT inhaler Inhale 2 puffs into the lungs every 4 (four) hours as needed for wheezing or shortness of breath. 8.5 each 11   Calcium Carb-Cholecalciferol (CALCIUM 1000 + D PO) Take by mouth.     cetirizine (ZYRTEC ALLERGY) 10 MG tablet Take 1 tablet (10 mg total) by mouth daily. 30 tablet 1   chlorpheniramine-HYDROcodone (TUSSIONEX PENNKINETIC ER) 10-8 MG/5ML Take 5 mLs by mouth every 12 (twelve) hours as needed. 140 mL 0   clopidogrel (PLAVIX) 75 MG tablet Take 75 mg by mouth daily.     diclofenac Sodium (VOLTAREN) 1 % GEL Apply 4 grams to the skin 4 times daily. Need appt. 300 g 0   escitalopram (LEXAPRO) 10 MG tablet Take 1 tablet (10 mg total) by mouth daily. 30 tablet 5   Evolocumab (REPATHA SURECLICK) 767 MG/ML SOAJ Inject 140 mg into the skin every 14 (fourteen) days. 2 mL 11   fluticasone furoate-vilanterol (BREO ELLIPTA) 100-25 MCG/ACT AEPB Inhale 1 puff into the lungs daily. 1 each 11   furosemide (LASIX) 40 MG tablet TAKE ONE TABLET BY MOUTH EVERY MORNING 90 tablet 0   ketoconazole (NIZORAL) 2 % cream APPLY TO THE AFFECTED AREA(S) of abdominal fold ONCE daily AS NEEDED 60 g 0   olmesartan-hydrochlorothiazide (BENICAR HCT) 20-12.5 MG tablet Take 1 tablet by mouth daily. 30 tablet 11   pantoprazole (PROTONIX) 20 MG tablet TAKE ONE TABLET BY MOUTH ONCE DAILY 30 tablet 11   potassium chloride SA (KLOR-CON M) 20 MEQ tablet Take 1  tablet (20 mEq total) by mouth 2 (two) times daily. 180 tablet 1   pregabalin (LYRICA) 100 MG capsule Take 1 capsule (100 mg total) by mouth 2 (two) times daily. 60 capsule 5   No current facility-administered medications for this visit.   Review of Systems General:  no complaints Skin: no complaints Eyes: no complaints HEENT: no complaints Breasts: no complaints Pulmonary: no complaints Cardiac: no complaints Gastrointestinal: no complaints Genitourinary/Sexual: no complaints Ob/Gyn: no complaints Musculoskeletal: no complaints Hematology: no complaints Neurologic/Psych: peripheral neuropathy   Objective:  Physical Examination:  BP (!) 168/73   Pulse (!) 58   Temp 98.7 F (37.1 C)   Resp 20   Wt 239 lb 12.8 oz (108.8 kg)   SpO2 97%   BMI 48.43 kg/m     ECOG Performance Status: 0 - Asymptomatic  GENERAL: Patient is a well appearing female in no acute distress HEENT:  Sclera clear. Anicteric NODES:  Negative axillary, supraclavicular, inguinal lymph node survery LUNGS:  Clear to auscultation bilaterally.   HEART:  Regular rate and rhythm.  ABDOMEN:  Soft, nontender.  No hernias, incisions well healed. No masses or ascites EXTREMITIES:  No peripheral edema. Atraumatic. No cyanosis SKIN:  Clear with no obvious rashes or skin changes.  NEURO:  Nonfocal. Well oriented.  Appropriate affect.  Pelvic: chaperoned by CMA. EGBUS: no lesions, discharge. Vagina: no bleeding, discharge. Vaginal cuff well healed. Vagina shortened post radiation. Atrophic. Bimanual: smooth, no masses. Rectal: deferred.   Assessment:  Molly [name] is a 71 y.o. female diagnosed with stage IIIC1 grade 2 endometrial cancer with bilateral positive pelvic SLNs with macroscopic tumor s/p TLH/BSO pelvic SLN biopsies 10/06/18.  Received sandwich therapy with 3  cycles of carboplatin/taxol, external pelvic radiation with vaginal brachytherapy and then 3 more cycles of chemotherapy completed 6/20.  Clinically,  NED today.   Chemotherapy induced peripheral neuropathy- stable on lyrica  COVID19 infection 9/20.  Tumor has MLH1 loss with promoter methylation. HER-2 negative.   Medical co-morbidities complicating care: Morbid obesity, CVA 1990 with no residual and no medication, HTN with good BP today.    Plan:   Problem List Items Addressed This Visit       Genitourinary   Endometrial adenocarcinoma ([place]) - Primary    She is now almost 4 years from her surgery. Clinically asymptomatic. NED on exam. Continue surveillance. Tumor has MLH1 loss with promoter methylation, so she would be a candidate for immune checkpoint inhibitor therapy if she has a recurrence in the future. HER-2 negative.   Continue follow up with Dr. Janese Banks and Dr. Baruch Gouty.  Dr Janese Banks will continue to manage her neuropathy.  RTC in 6 months with Dr Janese Banks and Gyn Onc in 1 yearor sooner if concerning symptoms.    We will attempt to schedule her mammogram and bone density for her today which was ordered by her PCP. She declines covid vaccine. I encouraged annual flu vaccine.   Beckey Rutter, DNP, AGNP-C [place] [facility] [phone] (clinic)  I personally interviewed and examined the patient. Agreed with the above/below plan of care. I have directly contributed to assessment and plan of care of this patient and educated and discussed with patient and family.  Mellody Drown, MD   CC:  Steele Sizer, [place] [address] [place],  [place] [address] [phone]

## 2022-09-02 ENCOUNTER — Telehealth: Payer: Self-pay

## 2022-09-02 NOTE — Progress Notes (Signed)
Chronic Care Management Pharmacy Assistant   Name: Molly [name]  MRN: [id number] DOB: 04/30/1951  Reason for Encounter: Medication Review/Medication Coordination Call.   Recent office visits:  08/04/2022 Neldon Labella RN (CCM) No medication Changes noted  Recent consult visits:  08/20/2022 Dr. Fransisca Connors MD (Oncology) No medication Changes noted  Hospital visits:  None in previous 6 months  Medications: Outpatient Encounter Medications as of 09/02/2022  Medication Sig Note   acetaminophen (TYLENOL) 500 MG tablet Take 2 tablets (1,000 mg total) by mouth every 6 (six) hours as needed for mild pain or moderate pain.    albuterol (VENTOLIN HFA) 108 (90 Base) MCG/ACT inhaler Inhale 2 puffs into the lungs every 4 (four) hours as needed for wheezing or shortness of breath.    Calcium Carb-Cholecalciferol (CALCIUM 1000 + D PO) Take by mouth.    cetirizine (ZYRTEC ALLERGY) 10 MG tablet Take 1 tablet (10 mg total) by mouth daily.    chlorpheniramine-HYDROcodone (TUSSIONEX PENNKINETIC ER) 10-8 MG/5ML Take 5 mLs by mouth every 12 (twelve) hours as needed.    clopidogrel (PLAVIX) 75 MG tablet Take 75 mg by mouth daily.    diclofenac Sodium (VOLTAREN) 1 % GEL Apply 4 grams to the skin 4 times daily. Need appt.    escitalopram (LEXAPRO) 10 MG tablet Take 1 tablet (10 mg total) by mouth daily.    Evolocumab (REPATHA SURECLICK) 956 MG/ML SOAJ Inject 140 mg into the skin every 14 (fourteen) days.    fluticasone furoate-vilanterol (BREO ELLIPTA) 100-25 MCG/ACT AEPB Inhale 1 puff into the lungs daily.    furosemide (LASIX) 40 MG tablet TAKE ONE TABLET BY MOUTH EVERY MORNING 02/17/2022: As needed   ketoconazole (NIZORAL) 2 % cream APPLY TO THE AFFECTED AREA(S) of abdominal fold ONCE daily AS NEEDED    olmesartan-hydrochlorothiazide (BENICAR HCT) 20-12.5 MG tablet Take 1 tablet by mouth daily.    pantoprazole (PROTONIX) 20 MG tablet TAKE ONE TABLET BY MOUTH ONCE DAILY    potassium chloride SA (KLOR-CON  M) 20 MEQ tablet Take 1 tablet (20 mEq total) by mouth 2 (two) times daily.    pregabalin (LYRICA) 100 MG capsule Take 1 capsule (100 mg total) by mouth 2 (two) times daily.    No facility-administered encounter medications on file as of 09/02/2022.    Care Gaps: COVID Vaccine Shingrix Vaccine Influenza Vaccine     Star Rating Drugs: Olmesartan-HCTZ 20-12.5 mg last filled on 08/12/2022 for 30 day supply at [facility].   Medication Fill Gaps: None  Reviewed chart for medication changes ahead of medication coordination call.   BP Readings from Last 3 Encounters:  08/20/22 (!) 168/73  07/23/22 (!) 148/76  04/16/22 126/78    Lab Results  Component Value Date   HGBA1C 6.2 (H) 04/16/2022     Patient obtains medications through Adherence Packaging  30 Days   Last adherence delivery included:  Escitalopram 10 mg one tablet daily-  Breakfast   Pregabalin 100 mg one Capsule two times daily- Breakfast,Evening meals    Olmesartan-HCTZ 20-12.5 mg one tablet daily -Breakfast   Potassium Chloride 20 MEQ 1 tablet  two times daily-Breakfast, evening meals   Pantoprazole  20 mg one tablet daily- Breakfast    Repatha 140 mg Inject 140 mg into the skin every 14 (fourteen) days   Calcium/Vitamin D3 315-250 once daily- Breakfast   Ventolin HFA 108 MCG/ACT -PRN    Breo 100-25 MCG/ Inhaler - 1 puff daily    Patient declined medications last month:  Ketoconazole 2% cream PRN     Levocetirizine 5 mg one tablet daily - evening meals (Adequate supply)   Furosemide 40 mg PRN (Vials, patient states her provider inform her  to take PRN)   Coenzyme Q10 one capsule Daily (OTC)  Patient is due for next adherence delivery on: 09/11/2022. Called patient and reviewed medications and coordinated delivery.  This delivery to include: Escitalopram 10 mg one tablet daily-  Breakfast   Pregabalin 100 mg one Capsule two times daily- Breakfast,Evening meals    Olmesartan-HCTZ 20-12.5 mg  one tablet daily -Breakfast   Potassium Chloride 20 MEQ 1 tablet  two times daily-Breakfast, evening meals   Pantoprazole  20 mg one tablet daily- Breakfast    Repatha 140 mg Inject 140 mg into the skin every 14 (fourteen) days   Calcium/Vitamin D3 315-250 once daily- Breakfast   Ventolin HFA 108 MCG/ACT -PRN    Breo 100-25 MCG/ Inhaler - 1 puff daily    Furosemide 40 mg PRN (Vials)  Patient declined the following medications: Ketoconazole 2% cream PRN     Levocetirizine 5 mg one tablet daily - evening meals (Adequate supply)  Coenzyme Q10 one capsule Daily (OTC)   Patient needs refills for None ID.  Confirmed delivery date of 09/11/2022 ([facility]) , advised patient that pharmacy will contact them the morning of delivery.  [place] Pharmacist Assistant [phone]

## 2022-10-01 ENCOUNTER — Telehealth: Payer: Self-pay

## 2022-10-01 DIAGNOSIS — J4521 Mild intermittent asthma with (acute) exacerbation: Secondary | ICD-10-CM

## 2022-10-01 NOTE — Progress Notes (Signed)
Chronic Care Management Pharmacy Assistant   Name: Molly [name]  MRN: [id number] DOB: Sep 01, 1951  Reason for Encounter: Medication Review/Medication Coordination Call.  Recent office visits:  None ID  Recent consult visits:  None ID  Hospital visits:  None in previous 6 months  Medications: Outpatient Encounter Medications as of 10/01/2022  Medication Sig Note   acetaminophen (TYLENOL) 500 MG tablet Take 2 tablets (1,000 mg total) by mouth every 6 (six) hours as needed for mild pain or moderate pain.    albuterol (VENTOLIN HFA) 108 (90 Base) MCG/ACT inhaler Inhale 2 puffs into the lungs every 4 (four) hours as needed for wheezing or shortness of breath.    Calcium Carb-Cholecalciferol (CALCIUM 1000 + D PO) Take by mouth.    cetirizine (ZYRTEC ALLERGY) 10 MG tablet Take 1 tablet (10 mg total) by mouth daily.    chlorpheniramine-HYDROcodone (TUSSIONEX PENNKINETIC ER) 10-8 MG/5ML Take 5 mLs by mouth every 12 (twelve) hours as needed.    clopidogrel (PLAVIX) 75 MG tablet Take 75 mg by mouth daily.    diclofenac Sodium (VOLTAREN) 1 % GEL Apply 4 grams to the skin 4 times daily. Need appt.    escitalopram (LEXAPRO) 10 MG tablet Take 1 tablet (10 mg total) by mouth daily.    Evolocumab (REPATHA SURECLICK) 867 MG/ML SOAJ Inject 140 mg into the skin every 14 (fourteen) days.    fluticasone furoate-vilanterol (BREO ELLIPTA) 100-25 MCG/ACT AEPB Inhale 1 puff into the lungs daily.    furosemide (LASIX) 40 MG tablet TAKE ONE TABLET BY MOUTH EVERY MORNING 02/17/2022: As needed   ketoconazole (NIZORAL) 2 % cream APPLY TO THE AFFECTED AREA(S) of abdominal fold ONCE daily AS NEEDED    olmesartan-hydrochlorothiazide (BENICAR HCT) 20-12.5 MG tablet Take 1 tablet by mouth daily.    pantoprazole (PROTONIX) 20 MG tablet TAKE ONE TABLET BY MOUTH ONCE DAILY    potassium chloride SA (KLOR-CON M) 20 MEQ tablet Take 1 tablet (20 mEq total) by mouth 2 (two) times daily.    pregabalin (LYRICA) 100 MG  capsule Take 1 capsule (100 mg total) by mouth 2 (two) times daily.    No facility-administered encounter medications on file as of 10/01/2022.    Care Gaps: COVID Vaccine Shingrix Vaccine Influenza Vaccine     Star Rating Drugs: Olmesartan-HCTZ 20-12.5 mg last filled on 08/12/2022 for 30 day supply at [facility].   Medication Fill Gaps: None  Reviewed chart for medication changes ahead of medication coordination call.   BP Readings from Last 3 Encounters:  08/20/22 (!) 168/73  07/23/22 (!) 148/76  04/16/22 126/78    Lab Results  Component Value Date   HGBA1C 6.2 (H) 04/16/2022     Patient obtains medications through Adherence Packaging  30 Days   Last adherence delivery included: Escitalopram 10 mg one tablet daily-  Breakfast   Pregabalin 100 mg one Capsule two times daily- Breakfast,Evening meals    Olmesartan-HCTZ 20-12.5 mg one tablet daily -Breakfast   Potassium Chloride 20 MEQ 1 tablet  two times daily-Breakfast, evening meals   Pantoprazole  20 mg one tablet daily- Breakfast    Repatha 140 mg Inject 140 mg into the skin every 14 (fourteen) days   Calcium/Vitamin D3 315-250 once daily- Breakfast   Ventolin HFA 108 MCG/ACT -PRN    Breo 100-25 MCG/ Inhaler - 1 puff daily     Furosemide 40 mg PRN (Vials)  Patient declined medication last month due: Ketoconazole 2% cream PRN  Levocetirizine 5 mg one tablet daily - evening meals (Adequate supply)   Coenzyme Q10 one capsule Daily (OTC)  Patient is due for next adherence delivery on: 10/13/2022. Called patient and reviewed medications and coordinated delivery.  This delivery to include: Escitalopram 10 mg one tablet daily-  Breakfast   Pregabalin 100 mg one Capsule two times daily- Breakfast,Evening meals    Olmesartan-HCTZ 20-12.5 mg one tablet daily -Breakfast   Potassium Chloride 20 MEQ 1 tablet  two times daily-Breakfast, evening meals   Pantoprazole  20 mg one tablet daily-  Breakfast    Repatha 140 mg Inject 140 mg into the skin every 14 (fourteen) days   Calcium/Vitamin D3 315-250 once daily- Breakfast   Ventolin HFA 108 MCG/ACT -PRN    Breo 100-25 MCG/ Inhaler - 1 puff daily     Furosemide 40 mg one tablet daily (Patient prefers Vials)  Patient declined the following medications: Ketoconazole 2% cream PRN     Levocetirizine 5 mg one tablet daily - evening meals (Adequate supply)   Coenzyme Q10 one capsule Daily (OTC)  Patient needs refills for Ventolin HFA  Prescribe by PCP.  Confirmed delivery date of 10/13/2022 ([facility]), advised patient that pharmacy will contact them the morning of delivery.  [place] Pharmacist Assistant [phone]

## 2022-10-07 MED ORDER — ALBUTEROL SULFATE HFA 108 (90 BASE) MCG/ACT IN AERS: 2.0000 | INHALATION_SPRAY | RESPIRATORY_TRACT | 11 refills | Status: DC | PRN

## 2022-10-07 NOTE — Addendum Note (Signed)
Addended by: Daron Offer A on: 10/07/2022 08:10 AM   Modules accepted: Orders

## 2022-10-29 ENCOUNTER — Other Ambulatory Visit: Payer: Self-pay | Admitting: Family Medicine

## 2022-10-29 ENCOUNTER — Ambulatory Visit (INDEPENDENT_AMBULATORY_CARE_PROVIDER_SITE_OTHER): Payer: 59

## 2022-10-29 DIAGNOSIS — I10 Essential (primary) hypertension: Secondary | ICD-10-CM

## 2022-10-29 DIAGNOSIS — G620 Drug-induced polyneuropathy: Secondary | ICD-10-CM

## 2022-10-29 DIAGNOSIS — J4520 Mild intermittent asthma, uncomplicated: Secondary | ICD-10-CM

## 2022-10-29 DIAGNOSIS — F325 Major depressive disorder, single episode, in full remission: Secondary | ICD-10-CM

## 2022-10-29 NOTE — Telephone Encounter (Signed)
Pt will need an appt before she will refill

## 2022-10-29 NOTE — Progress Notes (Signed)
Chronic Care Management Pharmacy Note  16/08/4502 Name:  Molly [name] MRN:  [id number] DOB:  1951-11-03  Summary: Patient presents for CCM follow-up. Her breathing symptoms are well controlled.  Recommendations/Changes made from today's visit: -Recommend stopping albuterol, Breo  -Recommend starting Symbicort 80-4.5 mcg/act 2 puffs twice daily as needed   Plan: CPP follow-up 3 months   Recommended Problem List Changes:  Add: Osteopenia of multiple sites  Subjective: Molly [name] is an 71 y.o. year old female who is a primary patient of Steele Sizer, MD.  The CCM team was consulted for assistance with disease management and care coordination needs.    Engaged with patient by telephone for follow up visit in response to provider referral for pharmacy case management and/or care coordination services.   Consent to Services:  The patient was given information about Chronic Care Management services, agreed to services, and gave verbal consent prior to initiation of services.  Please see initial visit note for detailed documentation.   Patient Care Team: Steele Sizer, MD as PCP - General (Family Medicine) Kate Sable, MD as PCP - Cardiology (Cardiology) Rubie Maid, MD as Consulting Physician (Obstetrics and Gynecology) Clent Jacks, RN as Registered Nurse Noreene Filbert, MD as Referring Physician (Radiation Oncology) Sindy Guadeloupe, MD as Consulting Physician (Oncology) Mellody Drown, MD as Referring Physician (Obstetrics) Germaine Pomfret, [facility] (Pharmacist) Vertell Limber (Neurology) Ventura Sellers, MD as Consulting Physician (Oncology)  Recent office visits: 05/22/22: Patient presented to Clemetine Marker, LPN.  09/14/90: Patient presented to Dr. Ancil Boozer for asthma exacerbation. Prednisone + Tussionex.  03/19/22: Patient presented to Dr. Ky Barban for URI.   Recent consult visits: 04/23/22: Patient presented to Dr. Vaslow(Oncology) 02/17/22:  Patient presented to Beckey Rutter, NP (Oncology) 02/03/22: Patient presented to Dr. Posey Pronto (rheumatology)   Hospital visits:  Objective:  Lab Results  Component Value Date   CREATININE 0.71 02/17/2022   BUN 18 02/17/2022   GFRNONAA >60 02/17/2022   GFRAA 81 11/07/2020   NA 136 02/17/2022   K 3.8 02/17/2022   CALCIUM 9.3 02/17/2022   CO2 26 02/17/2022   GLUCOSE 107 (H) 02/17/2022    Lab Results  Component Value Date/Time   HGBA1C 6.2 (H) 04/16/2022 01:52 PM   HGBA1C 6.4 (H) 08/26/2021 11:59 AM    Last diabetic Eye exam: No results found for: "HMDIABEYEEXA"  Last diabetic Foot exam: No results found for: "HMDIABFOOTEX"   Lab Results  Component Value Date   CHOL 196 04/16/2022   HDL 68 04/16/2022   LDLCALC 105 (H) 04/16/2022   TRIG 136 04/16/2022   CHOLHDL 2.9 04/16/2022       Latest Ref Rng & Units 02/17/2022   12:55 PM 07/30/2021    9:40 AM 01/29/2021    9:43 AM  Hepatic Function  Total Protein 6.5 - 8.1 g/dL 7.5  7.2  7.4   Albumin 3.5 - 5.0 g/dL 4.1  3.9  4.1   AST 15 - 41 U/L _0 ALT 0 - 44 U/L _1 Alk Phosphatase 38 - 126 U/L 73  67  77   Total Bilirubin 0.3 - 1.2 mg/dL 0.2  0.4  0.7     Lab Results  Component Value Date/Time   TSH 1.47 01/23/2017 08:29 AM       Latest Ref Rng & Units 02/17/2022   12:55 PM 07/30/2021    9:40 AM 01/29/2021    9:43 AM  CBC  WBC 4.0 - 10.5 K/uL 5.0  7.7  5.3   Hemoglobin 12.0 - 15.0 g/dL 12.0  11.8  12.0   Hematocrit 36.0 - 46.0 % 36.7  36.2  35.7   Platelets 150 - 400 K/uL 296  330  275     Lab Results  Component Value Date/Time   VD25OH 38 01/23/2017 08:29 AM    Clinical ASCVD: Yes  The ASCVD Risk score (Arnett DK, et al., 2019) failed to calculate for the following reasons:   The patient has a prior MI or stroke diagnosis       05/22/2022    3:02 PM 04/23/2022   11:21 AM 04/16/2022    1:19 PM  Depression screen PHQ 2/9  Decreased Interest 0 0 0  Down, Depressed, Hopeless 0 0 0  PHQ - 2 Score 0  0 0  Altered sleeping   0  Tired, decreased energy   0  Change in appetite   0  Feeling bad or failure about yourself    0  Trouble concentrating   0  Moving slowly or fidgety/restless   0  Suicidal thoughts   0  PHQ-9 Score   0     Social History   Tobacco Use  Smoking Status Never  Smokeless Tobacco Never  Tobacco Comments   smoking cessation materials not required   BP Readings from Last 3 Encounters:  08/20/22 (!) 168/73  07/23/22 (!) 148/76  04/16/22 126/78   Pulse Readings from Last 3 Encounters:  08/20/22 (!) 58  07/23/22 60  04/16/22 77   Wt Readings from Last 3 Encounters:  08/20/22 239 lb 12.8 oz (108.8 kg)  07/23/22 234 lb 3.2 oz (106.2 kg)  04/23/22 228 lb (103.4 kg)   BMI Readings from Last 3 Encounters:  08/20/22 48.43 kg/m  07/23/22 47.30 kg/m  04/23/22 46.05 kg/m    Assessment/Interventions: Review of patient past medical history, allergies, medications, health status, including review of consultants reports, laboratory and other test data, was performed as part of comprehensive evaluation and provision of chronic care management services.   SDOH:  (Social Determinants of Health) assessments and interventions performed: Yes SDOH Interventions    Flowsheet Row Clinical Support from 05/22/2022 in [place] Management from 10/09/2021 in [place] Management from 05/15/2021 in [place] Management from 04/03/2021 in [facility] Office Visit from 09/07/2018 in [place] Interventions Intervention Not Indicated -- Intervention Not Indicated -- --  Housing Interventions Intervention Not Indicated -- -- -- --  Transportation Interventions Intervention Not Indicated -- Intervention Not Indicated -- --  Depression Interventions/Treatment  -- -- -- -- Medication  Financial  Strain Interventions Intervention Not Indicated Intervention Not Indicated -- Intervention Not Indicated --  Physical Activity Interventions Intervention Not Indicated -- -- -- --  Stress Interventions Intervention Not Indicated -- -- -- --  Social Connections Interventions Intervention Not Indicated -- -- -- --        SDOH Screenings   Food Insecurity: No Food Insecurity (05/22/2022)  Housing: Low Risk  (05/22/2022)  Transportation Needs: No Transportation Needs (05/22/2022)  Alcohol Screen: Low Risk  (05/22/2022)  Depression (PHQ2-9): Low Risk  (05/22/2022)  Financial Resource Strain: Low Risk  (05/22/2022)  Physical Activity: Inactive (05/22/2022)  Social Connections: Moderately Integrated (05/22/2022)  Stress: No Stress Concern Present (05/22/2022)  Tobacco Use:  Low Risk  (08/20/2022)    CCM Care Plan  Allergies  Allergen Reactions   Ferumoxytol Anaphylaxis   Liraglutide Other (See Comments)    pancreatitis   Nsaids Hives, Rash and Nausea And Vomiting   Clopidogrel Other (See Comments)    Reports intolerance and states causes myalgias   Statins Other (See Comments) and Nausea And Vomiting    Joint pains   Oxycodone Nausea And Vomiting   Crestor [Rosuvastatin Calcium] Rash   Zetia [Ezetimibe] Other (See Comments)    Muscle weakness and joint pain    Medications Reviewed Today     Reviewed by Mellody Drown, MD (Physician) on 08/20/22 at 1518  Med List Status: <None>   Medication Order Taking? Sig Documenting Provider Last Dose Status Informant  acetaminophen (TYLENOL) 500 MG tablet [id number] Yes Take 2 tablets (1,000 mg total) by mouth every 6 (six) hours as needed for mild pain or moderate pain. Rubie Maid, MD Taking Active Self  albuterol (VENTOLIN HFA) 108 (90 Base) MCG/ACT inhaler [id number] Yes Inhale 2 puffs into the lungs every 4 (four) hours as needed for wheezing or shortness of breath. Steele Sizer, MD Taking Active   Calcium Carb-Cholecalciferol (CALCIUM  1000 + D PO) [id number] Yes Take by mouth. [provider] Taking Active Self  cetirizine (ZYRTEC ALLERGY) 10 MG tablet [id number] Yes Take 1 tablet (10 mg total) by mouth daily. Bo Merino, FNP Taking Active   chlorpheniramine-HYDROcodone [facility] ER) 10-8 MG/5ML [id number] Yes Take 5 mLs by mouth every 12 (twelve) hours as needed. Steele Sizer, MD Taking Active   clopidogrel (PLAVIX) 75 MG tablet [id number] Yes Take 75 mg by mouth daily. [provider] Taking Active   diclofenac Sodium (VOLTAREN) 1 % GEL [id number] Yes Apply 4 grams to the skin 4 times daily. Need appt. Steele Sizer, MD Taking Active   escitalopram (LEXAPRO) 10 MG tablet [id number] Yes Take 1 tablet (10 mg total) by mouth daily. Steele Sizer, MD Taking Active   Evolocumab [facility] SURECLICK) 701 MG/ML Darden Palmer [id number] Yes Inject 140 mg into the skin every 14 (fourteen) days. Steele Sizer, MD Taking Active   fluticasone furoate-vilanterol (BREO ELLIPTA) 100-25 MCG/ACT AEPB [id number] Yes Inhale 1 puff into the lungs daily. Myles Gip, DO Taking Active   furosemide (LASIX) 40 MG tablet [id number] Yes TAKE ONE TABLET BY MOUTH EVERY Jessee Avers, MD Taking Active            Med Note Ivor Costa, TIFFANY S   Mon Feb 17, 2022  1:13 PM) As needed  ketoconazole (NIZORAL) 2 % cream [id number] Yes APPLY TO THE AFFECTED AREA(S) of abdominal fold ONCE daily AS NEEDED Steele Sizer, MD Taking Active   olmesartan-hydrochlorothiazide (BENICAR HCT) 20-12.5 MG tablet [id number] Yes Take 1 tablet by mouth daily. Steele Sizer, MD Taking Active   pantoprazole (PROTONIX) 20 MG tablet [id number] Yes TAKE ONE TABLET BY MOUTH ONCE DAILY Steele Sizer, MD Taking Active   potassium chloride SA (KLOR-CON M) 20 MEQ tablet [id number] Yes Take 1 tablet (20 mEq total) by mouth 2 (two) times daily. Steele Sizer, MD Taking Active   pregabalin (LYRICA) 100 MG capsule [id number] Yes Take 1 capsule (100 mg  total) by mouth 2 (two) times daily. 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 ([place]) 01/11/2019   Iron deficiency anemia 11/15/2018   Endometrial adenocarcinoma ([place]) 02/72/5366   Umbilical hernia without obstruction and without gangrene    Atherosclerosis of right coronary artery 11/10/2017   Atherosclerosis of abdominal aorta ([place]) 11/10/2017   Prediabetes 10/02/2017   Perennial allergic rhinitis with seasonal variation 05/14/2016   Asthma, mild intermittent, well-controlled 05/14/2016   Hypertension goal BP (blood pressure) < 140/90 12/10/2015   Cardiomyopathy due to hypertension ([place]) 12/10/2015   History of CVA (cerebrovascular accident) 12/10/2015   Hyperlipidemia LDL goal <70 12/10/2015   Statin intolerance 12/10/2015    Immunization History  Administered Date(s) Administered   Fluad Quad(high Dose 65+) 10/05/2019, 10/15/2020   Influenza, High Dose Seasonal PF 11/11/2016, 10/02/2017, 09/07/2018, 08/26/2021   Pneumococcal Conjugate-13 04/06/2018   Pneumococcal Polysaccharide-23 11/11/2016   Tdap 11/11/2016    Conditions to be addressed/monitored:  Hypertension, Hyperlipidemia, Coronary Artery Disease, Asthma, Depression and Allergic Rhinitis  Care Plan : General Pharmacy (Adult)  Updates made by Germaine Pomfret, RPH since 11/06/2022 12:00 AM     Problem: Hypertension, Hyperlipidemia, Coronary Artery Disease, Asthma, Depression and Allergic Rhinitis   Priority: High     Long-Range Goal: Patient-Specific Goal   Start Date: 04/03/2021  Expected End Date: 11/07/2023  This Visit's Progress: On track  Recent  Progress: On track  Priority: High  Note:   Current Barriers:  Unable to independently monitor therapeutic efficacy Unable to achieve control of Cholesterol   Pharmacist Clinical Goal(s):  Patient will achieve control of blood pressure as evidenced by Home BP less than 140/90 maintain control of cholesterol as evidenced by LDL less than 70  through collaboration with PharmD and provider.   Interventions: 1:1 collaboration with Steele Sizer, MD regarding development and update of comprehensive plan of care as evidenced by provider attestation and co-signature Inter-disciplinary care team collaboration (see longitudinal plan of care) Comprehensive medication review performed; medication list updated in electronic medical record  Hypertension (BP goal <140/90) -Controlled -Current treatment: Furosemide 40 mg daily Olmesartan-HCTZ 20-12.5 mg daily -Medications previously tried: NA  -Current home readings: NA -Denies hypotensive/hypertensive symptoms -Educated on Daily salt intake goal < 2300 mg; Exercise goal of 150 minutes per week; Importance of home blood pressure monitoring; -Counseled to monitor BP at home weekly, document, and provide log at future appointments -Recommended to continue current medication  Hyperlipidemia: (LDL goal < 70) -History of TIA  -Uncontrolled -Current treatment: Repatha 140 mg every 14 days: Appropriate, Effective, Safe, Accessible -Current antiplatelet treatment: Clopidogrel 75 mg daily (evening): Appropriate, Effective, Safe, Accessible  -Medications previously tried: Statins, Aspirin -Current dietary habits: cheerios, baked chicken, salad. Steak <1x monthly  -Continue current medications  Asthma (Goal: control symptoms and prevent exacerbations) -Not ideally controlled -Current treatment  Ventolin HFA 2 puffs every 4 hours as needed Breo 1 puff daily as needed  -Current allergic rhinitis treatment  Cetirizine 10 mg daily -Medications  previously tried: NA  -Allergies normally worse in the fall.  -In the past four weeks, has the patient had:  Daytime asthma symptoms more than twice weekly?   No Any instance of night waking due to asthma?   No Utilized albuterol reliever for symptoms more than twice weekly? Yes - twice daily Any activity limitation due to asthma?    No -Exacerbations requiring treatment in last 6 months: Yes due to  perfume exposure - April 2023  -Patient denies consistent use of maintenance inhaler; only takes Breo PRN (10 times in past month).  -Recommend stopping albuterol, Breo  -Recommend starting Symbicort 80-4.5 mcg/act 2 puffs twice daily as needed   Depression/Anxiety (Goal: Maintain stable mood) -Controlled -Current treatment: Escitalopram 10 mg daily (AM)  -Medications previously tried/failed: NA -PHQ9: 0 -GAD7: 0 -Connected with NA for mental health support -Educated on Benefits of medication for symptom control -Recommended to continue current medication  Osteopenia (Goal Prevent fractures) -Uncontrolled -Last DEXA Scan: 11/18/21   T-Score femoral neck: -1.5  T-Score total hip: -0.7  T-Score lumbar spine: -1.7  T-Score forearm radius: NA  10-year probability of major osteoporotic fracture: 3.5%  10-year probability of hip fracture: 0.5% -Patient is not a candidate for pharmacologic treatment -Current treatment  Calcium + D3 daily  -Medications previously tried: None -Continue current medications   GERD (Goal: Prevent heartburn / reflux) -Controlled -Current treatment  Pantoprazole 20 mg daily (bedtime)  -Medications previously tried: NA  -Avoids pizza at night  -Counseled on trigger avoidance, not laying down after meals -Recommended to continue current medication  Chronic Pain (Goal: Minimize pain and maintain quality of life) -Controlled -Current treatment  Acetaminophen 500 mg 2 tablets  Voltaren 1 % gel  Pregabalin 100 mg twice daily  -Medications previously  tried: NA -Counseled to limit acetaminophen use to < 3000 mg daily.   -Recommended to continue current medication  Patient Goals/Self-Care Activities Patient will:  - focus on medication adherence by utilizing [facility] services check blood pressure weekly, document, and provide at future appointments  Follow Up Plan: Telephone follow up appointment with care management team member scheduled for:  04/29/2023 at 3:00 PM     Medication Assistance: None required.  Patient affirms current coverage meets needs.  Patient's preferred pharmacy is:  [facility] - [place], [place] - [address] [place] [address] Phone: [phone] Fax: [phone]   Patient decided to: Utilize [facility] for medication synchronization, packaging and delivery  Care Plan and Follow Up Patient Decision:  Patient agrees to Care Plan and Follow-up.  Plan: Telephone follow up appointment with care management team member scheduled for:  04/29/2023 at 3:00 PM  Malva Limes, [place] Pharmacist Practitioner  [facility] [phone]

## 2022-10-29 NOTE — Telephone Encounter (Signed)
Lvm asking pt to return call to sch appt for refills

## 2022-10-30 ENCOUNTER — Telehealth: Payer: Self-pay

## 2022-10-30 NOTE — Chronic Care Management (AMB) (Signed)
Chronic Care Management Pharmacy Assistant   Name: Molly [name]  MRN: [id number] DOB: [birth year]   Reason for Encounter: Medication Review/Medication Coordination Call.   Recent office visits:  None ID  Recent consult visits:  None ID  Hospital visits:  None in previous 6 months  Medications: Outpatient Encounter Medications as of 10/30/2022  Medication Sig Note   acetaminophen (TYLENOL) 500 MG tablet Take 2 tablets (1,000 mg total) by mouth every 6 (six) hours as needed for mild pain or moderate pain.    albuterol (VENTOLIN HFA) 108 (90 Base) MCG/ACT inhaler Inhale 2 puffs into the lungs every 4 (four) hours as needed for wheezing or shortness of breath.    Calcium Carb-Cholecalciferol (CALCIUM 1000 + D PO) Take by mouth.    cetirizine (ZYRTEC ALLERGY) 10 MG tablet Take 1 tablet (10 mg total) by mouth daily.    chlorpheniramine-HYDROcodone (TUSSIONEX PENNKINETIC ER) 10-8 MG/5ML Take 5 mLs by mouth every 12 (twelve) hours as needed.    clopidogrel (PLAVIX) 75 MG tablet Take 75 mg by mouth daily.    diclofenac Sodium (VOLTAREN) 1 % GEL Apply 4 grams to the skin 4 times daily. Need appt.    escitalopram (LEXAPRO) 10 MG tablet Take 1 tablet (10 mg total) by mouth daily.    Evolocumab (REPATHA SURECLICK) 622 MG/ML SOAJ Inject 140 mg into the skin every 14 (fourteen) days.    fluticasone furoate-vilanterol (BREO ELLIPTA) 100-25 MCG/ACT AEPB Inhale 1 puff into the lungs daily.    furosemide (LASIX) 40 MG tablet TAKE ONE TABLET BY MOUTH EVERY MORNING 02/17/2022: As needed   ketoconazole (NIZORAL) 2 % cream APPLY TO THE AFFECTED AREA(S) of abdominal fold ONCE daily AS NEEDED    olmesartan-hydrochlorothiazide (BENICAR HCT) 20-12.5 MG tablet Take 1 tablet by mouth daily.    pantoprazole (PROTONIX) 20 MG tablet TAKE ONE TABLET BY MOUTH ONCE DAILY    potassium chloride SA (KLOR-CON M) 20 MEQ tablet Take 1 tablet (20 mEq total) by mouth 2 (two) times daily.    pregabalin (LYRICA) 100 MG  capsule Take 1 capsule (100 mg total) by mouth 2 (two) times daily.    No facility-administered encounter medications on file as of 10/30/2022.    Care Gaps: COVID Vaccine Shingrix Vaccine Influenza Vaccine     Star Rating Drugs: Olmesartan-HCTZ 20-12.5 mg last filled on 10/09/2022 for 30 day supply at [facility].   Medication Fill Gaps: None  Reviewed chart for medication changes ahead of medication coordination call.   BP Readings from Last 3 Encounters:  08/20/22 (!) 168/73  07/23/22 (!) 148/76  04/16/22 126/78    Lab Results  Component Value Date   HGBA1C 6.2 (H) 04/16/2022     Patient obtains medications through Adherence Packaging  30 Days   Last adherence delivery included: Escitalopram 10 mg one tablet daily-  Breakfast   Pregabalin 100 mg one Capsule two times daily- Breakfast,Evening meals    Olmesartan-HCTZ 20-12.5 mg one tablet daily -Breakfast   Potassium Chloride 20 MEQ 1 tablet  two times daily-Breakfast, evening meals   Pantoprazole  20 mg one tablet daily- Breakfast    Repatha 140 mg Inject 140 mg into the skin every 14 (fourteen) days   Calcium/Vitamin D3 315-250 once daily- Breakfast   Ventolin HFA 108 MCG/ACT -PRN    Breo 100-25 MCG/ Inhaler - 1 puff daily     Furosemide 40 mg one tablet daily (Patient prefers Vials)  Patient declined medications last month: Ketoconazole 2% cream  PRN     Levocetirizine 5 mg one tablet daily - evening meals (Adequate supply)   Coenzyme Q10 one capsule Daily (OTC)  Patient is due for next adherence delivery on: 11/11/2022. Called patient and reviewed medications and coordinated delivery.  This delivery to include: Escitalopram 10 mg one tablet daily-  Breakfast   Pregabalin 100 mg one Capsule two times daily- Breakfast,Evening meals    Olmesartan-HCTZ 20-12.5 mg one tablet daily -Breakfast   Potassium Chloride 20 MEQ 1 tablet  two times daily-Breakfast, evening meals   Pantoprazole  20 mg  one tablet daily- Breakfast    Repatha 140 mg Inject 140 mg into the skin every 14 (fourteen) days   Calcium/Vitamin D3 315-250 once daily- Breakfast   Furosemide 40 mg one tablet daily (Patient prefers Vials)   Patient declined the following medications Ventolin HFA 108 MCG/ACT -PRN - Adequate Supply   Breo 100-25 MCG/ Inhaler - 1 puff daily  - Adequate Supply  Patient needs refills for Escitalopram, Pregabalin  - patient needs appt with PCP before refills.Patient is aware to make follow up in order to receive further refills .  Confirmed delivery date of 11/11/2022 ([facility]), advised patient that pharmacy will contact them the morning of delivery.  [place] Pharmacist Assistant [phone]

## 2022-11-04 ENCOUNTER — Other Ambulatory Visit: Payer: Self-pay | Admitting: Family Medicine

## 2022-11-04 DIAGNOSIS — I10 Essential (primary) hypertension: Secondary | ICD-10-CM

## 2022-11-04 DIAGNOSIS — I43 Cardiomyopathy in diseases classified elsewhere: Secondary | ICD-10-CM

## 2022-11-04 DIAGNOSIS — R600 Localized edema: Secondary | ICD-10-CM

## 2022-11-04 NOTE — Telephone Encounter (Signed)
Requested medication (s) are due for refill today: yes  Requested medication (s) are on the active medication list: yes  Last refill:  11/08/21 #90/0  Future visit scheduled: no  Notes to clinic:  Unable to refill per protocol due to failed labs, no updated results.    Requested Prescriptions  Pending Prescriptions Disp Refills   furosemide (LASIX) 40 MG tablet [Pharmacy Med Name: furosemide 40 mg tablet] 90 tablet 0    Sig: TAKE ONE TABLET BY MOUTH EVERY MORNING     Cardiovascular:  Diuretics - Loop Failed - 11/04/2022  4:56 PM      Failed - K in normal range and within 180 days    Potassium  Date Value Ref Range Status  02/17/2022 3.8 3.5 - 5.1 mmol/L Final         Failed - Ca in normal range and within 180 days    Calcium  Date Value Ref Range Status  02/17/2022 9.3 8.9 - 10.3 mg/dL Final         Failed - Na in normal range and within 180 days    Sodium  Date Value Ref Range Status  02/17/2022 136 135 - 145 mmol/L Final         Failed - Cr in normal range and within 180 days    Creat  Date Value Ref Range Status  11/07/2020 0.85 0.50 - 0.99 mg/dL Final    Comment:    For patients >48 years of age, the reference limit for Creatinine is approximately 13% higher for people identified as African-American. .    Creatinine, Ser  Date Value Ref Range Status  02/17/2022 0.71 0.44 - 1.00 mg/dL Final         Failed - Cl in normal range and within 180 days    Chloride  Date Value Ref Range Status  02/17/2022 104 98 - 111 mmol/L Final         Failed - Mg Level in normal range and within 180 days    Magnesium  Date Value Ref Range Status  02/02/2019 1.6 (L) 1.7 - 2.4 mg/dL Final    Comment:    Performed at [facility], [address] [place], [place] [address]         Failed - Last BP in normal range    BP Readings from Last 1 Encounters:  08/20/22 (!) 168/73         Passed - Valid encounter within last 6 months    Recent Outpatient Visits            6 months ago Asthma, extrinsic, mild intermittent, with acute exacerbation   [facility] Steele Sizer, MD   6 months ago Morbid obesity [facility])   [facility] Steele Sizer, MD   7 months ago Viral URI   [facility] Rory Percy M, DO   11 months ago Viral upper respiratory tract infection   [place], FNP   1 year ago History of TIA (transient ischemic attack)   [facility] Steele Sizer, MD

## 2022-11-06 NOTE — Patient Instructions (Signed)
Visit Information It was great speaking with you today!  Please let me know if you have any questions about our visit.   Goals Addressed             This Visit's Progress    Track and Manage My Symptoms-Asthma   On track    Timeframe:  Long-Range Goal Priority:  High Start Date: 04/03/2021                            Expected End Date:  10/04/2023                     Follow Up within 90 days   - avoid symptom triggers outdoors - eliminate symptom triggers at home - keep rescue medicines on hand  - take long-acting maintenance inhaler every daily    Why is this important?   Keeping track of asthma symptoms can tell you a lot about your asthma control.  Based on symptoms and peak flow results you can see how well you are doing.  Your asthma action plan has a green, yellow and red zone. Green means all is good; it is your goal. Yellow means your symptoms are a little worse. You will need to adjust your medications. Being in the red zone means that your   asthma is out of control. You will need to use your rescue medicines. You may need emergency care.     Notes:         Patient Care Plan: General Pharmacy (Adult)     Problem Identified: Hypertension, Hyperlipidemia, Coronary Artery Disease, Asthma, Depression and Allergic Rhinitis   Priority: High     Long-Range Goal: Patient-Specific Goal   Start Date: 04/03/2021  Expected End Date: 11/07/2023  This Visit's Progress: On track  Recent Progress: On track  Priority: High  Note:   Current Barriers:  Unable to independently monitor therapeutic efficacy Unable to achieve control of Cholesterol   Pharmacist Clinical Goal(s):  Patient will achieve control of blood pressure as evidenced by Home BP less than 140/90 maintain control of cholesterol as evidenced by LDL less than 70  through collaboration with PharmD and provider.   Interventions: 1:1 collaboration with Steele Sizer, MD regarding development and update of  comprehensive plan of care as evidenced by provider attestation and co-signature Inter-disciplinary care team collaboration (see longitudinal plan of care) Comprehensive medication review performed; medication list updated in electronic medical record  Hypertension (BP goal <140/90) -Controlled -Current treatment: Furosemide 40 mg daily Olmesartan-HCTZ 20-12.5 mg daily -Medications previously tried: NA  -Current home readings: NA -Denies hypotensive/hypertensive symptoms -Educated on Daily salt intake goal < 2300 mg; Exercise goal of 150 minutes per week; Importance of home blood pressure monitoring; -Counseled to monitor BP at home weekly, document, and provide log at future appointments -Recommended to continue current medication  Hyperlipidemia: (LDL goal < 70) -History of TIA  -Uncontrolled -Current treatment: Repatha 140 mg every 14 days: Appropriate, Effective, Safe, Accessible -Current antiplatelet treatment: Clopidogrel 75 mg daily (evening): Appropriate, Effective, Safe, Accessible  -Medications previously tried: Statins, Aspirin -Current dietary habits: cheerios, baked chicken, salad. Steak <1x monthly  -Continue current medications  Asthma (Goal: control symptoms and prevent exacerbations) -Not ideally controlled -Current treatment  Ventolin HFA 2 puffs every 4 hours as needed Breo 1 puff daily as needed  -Current allergic rhinitis treatment  Cetirizine 10 mg daily -Medications previously tried: NA  -Allergies normally worse in  the fall.  -In the past four weeks, has the patient had:  Daytime asthma symptoms more than twice weekly?   No Any instance of night waking due to asthma?   No Utilized albuterol reliever for symptoms more than twice weekly? Yes - twice daily Any activity limitation due to asthma?    No -Exacerbations requiring treatment in last 6 months: Yes due to perfume exposure - April 2023  -Patient denies consistent use of maintenance inhaler; only  takes Breo PRN (10 times in past month).  -Recommend stopping albuterol, Breo  -Recommend starting Symbicort 80-4.5 mcg/act 2 puffs twice daily as needed   Depression/Anxiety (Goal: Maintain stable mood) -Controlled -Current treatment: Escitalopram 10 mg daily (AM)  -Medications previously tried/failed: NA -PHQ9: 0 -GAD7: 0 -Connected with NA for mental health support -Educated on Benefits of medication for symptom control -Recommended to continue current medication  Osteopenia (Goal Prevent fractures) -Uncontrolled -Last DEXA Scan: 11/18/21   T-Score femoral neck: -1.5  T-Score total hip: -0.7  T-Score lumbar spine: -1.7  T-Score forearm radius: NA  10-year probability of major osteoporotic fracture: 3.5%  10-year probability of hip fracture: 0.5% -Patient is not a candidate for pharmacologic treatment -Current treatment  Calcium + D3 daily  -Medications previously tried: None -Continue current medications   GERD (Goal: Prevent heartburn / reflux) -Controlled -Current treatment  Pantoprazole 20 mg daily (bedtime)  -Medications previously tried: NA  -Avoids pizza at night  -Counseled on trigger avoidance, not laying down after meals -Recommended to continue current medication  Chronic Pain (Goal: Minimize pain and maintain quality of life) -Controlled -Current treatment  Acetaminophen 500 mg 2 tablets  Voltaren 1 % gel  Pregabalin 100 mg twice daily  -Medications previously tried: NA -Counseled to limit acetaminophen use to < 3000 mg daily.   -Recommended to continue current medication  Patient Goals/Self-Care Activities Patient will:  - focus on medication adherence by utilizing [facility] check blood pressure weekly, document, and provide at future appointments  Follow Up Plan: Telephone follow up appointment with care management team member scheduled for:  04/29/2023 at 3:00 PM    Patient agreed to services and verbal consent  obtained.   Patient verbalizes understanding of instructions and care plan provided today and agrees to view in [place]. Active MyChart status and patient understanding of how to access instructions and care plan via MyChart confirmed with patient.     Malva Limes, [place] Pharmacist Practitioner  [facility] [phone]

## 2022-11-07 NOTE — Progress Notes (Deleted)
Name: Molly [name]   MRN: [id number]    DOB: [birth year]   Date:11/07/2022       Progress Note  Subjective  Chief Complaint  Follow Up  HPI  Asthma: mild persistent: she is on daily Breo prn  and states no longer having daily cough, wheezing or SOB. She had a bronchitis/asthma flare a few weeks ago but is feeling well now   Major Depression: currently in remission doing better emotionally with Lexapro. 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    Cardiomyopathy without CHF: lower extremity edema but mild and stable, she denies orthopnea, PND  or SOB    Endometrial cancer: s/p hysterectomy, finished the chemo and radiation therapy with Dr. Baruch Gouty , no longer has a port, she had a CT pelvis 10/2020 and negative for recurrence of cancer. She is up to date with visits with Dr. Janese Banks and Donella Stade, also sees GYN and last CA 125 was 11 . She has neuropathy from chemotherapy, she continues to have intermittent pain on her toe. She is up to date with follow up with oncologist   Morbid obesity: off victoza because of episode of pancreatitis,. She gained from 213 lbs to 236 lbs after treatment for endometrial cancer, weight was in the 240 lbs for months but is now 228 lbs, she states she has stopped eating fried food and sodas for one month because of a church fast and now she is eating fried food again and some sodas but not as frequently.    Dyslipidemia:  She has history of TIA and CAD, we finally her started on Repatha June 2022 since unable to tolerate statin therapy and Zetia was not getting LDL below 70 , last LDL , two months after starting Repatha was down from 130 to 109 but goal is below 70 , she states currently only using Repatha once a month, explained she needs to take it every two weeks.   HTN: bp today  is at goal, no chest pain, dizziness, or palpitation. Doing well on current medications and denies side effects   Atherosclerosis aorta:  she is only on Repatha June 2022 last LDL was  not at goal but at the time she was only taking once injection monthly but is now taking it twice month and we will recheck levels   GERD: symptoms are well controlled, she has been off medication for months , currently on prn medication   PMR: she was seen by Dr. Ky Barban back in Feb 2022 , had negative ANA and RF, CRP and sed rate were very high for acute onset of pain on both shoulders with difficulty abduction and had to use other hand to assist abduction of right shoulder. When she saw me in April 22 levels were still high and pain was still present we repeated SED rate and CRP and since still positive we referred her to Dr. Posey Pronto ( Rheumatologist) she was formally diagnosed with PMR and was given prednisone for months and finally weaned off 08/2021,  she states generalized body aches has improved   Achilles tendinitis: she states it was constant pain but now doing some stretching in mornings and it has helped with symptoms advised to add ice packs   Patient Active Problem List   Diagnosis Date Noted   Polymyalgia rheumatica syndrome ([place]) 08/26/2021   Major depression in remission ([place]) 04/23/2021   Morbid obesity ([place]) 04/23/2021   Peripheral neuropathy due to chemotherapy ([place]) 04/23/2021   Myalgia 03/20/2021   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 ([place]) 01/11/2019   Iron deficiency anemia 11/15/2018   Endometrial adenocarcinoma ([place]) 22/29/7989   Umbilical hernia without obstruction and without gangrene    Atherosclerosis of right coronary artery 11/10/2017   Atherosclerosis of abdominal aorta ([place]) 11/10/2017   Prediabetes 10/02/2017   Perennial allergic rhinitis with seasonal variation 05/14/2016   Asthma, mild intermittent, well-controlled 05/14/2016   Hypertension goal BP (blood pressure) < 140/90 12/10/2015   Cardiomyopathy due to hypertension ([place]) 12/10/2015   History of CVA (cerebrovascular accident) 12/10/2015   Hyperlipidemia LDL  goal <70 12/10/2015   Statin intolerance 12/10/2015    Past Surgical History:  Procedure Laterality Date   ABDOMINAL HYSTERECTOMY     CHOLECYSTECTOMY     COLONOSCOPY WITH PROPOFOL N/A 01/08/2017   Procedure: COLONOSCOPY WITH PROPOFOL;  Surgeon: [name], MD;  Location: [facility] ENDOSCOPY;  Service: Endoscopy;  Laterality: N/A;   COLONOSCOPY WITH PROPOFOL N/A 05/05/2018   Procedure: COLONOSCOPY WITH PROPOFOL;  Surgeon: [name], MD;  Location: [facility] ENDOSCOPY;  Service: Gastroenterology;  Laterality: N/A;   COLONOSCOPY WITH PROPOFOL N/A 07/15/2021   Procedure: COLONOSCOPY WITH PROPOFOL;  Surgeon: [name], MD;  Location: [facility] ENDOSCOPY;  Service: Gastroenterology;  Laterality: N/A;   HERNIA REPAIR  21/1941   umbilical   PORTACATH PLACEMENT N/A 11/04/2018   Procedure: INSERTION PORT-A-CATH;  Surgeon: Jules Husbands, MD;  Location: [facility] ORS;  Service: General;  Laterality: N/A;   SENTINEL NODE BIOPSY N/A 10/06/2018   Procedure: SENTINEL NODE BIOPSY;  Surgeon: Mellody Drown, MD;  Location: [facility] ORS;  Service: Gynecology;  Laterality: N/A;   UMBILICAL HERNIA REPAIR N/A 11/17/2017   Procedure: HERNIA REPAIR UMBILICAL ADULT;  Surgeon: Jules Husbands, MD;  Location: [facility] ORS;  Service: General;  Laterality: N/A;    Family History  Problem Relation Age of Onset   Stroke Mother    Hypertension Mother    Dementia Mother    Alzheimer's disease Mother    Gout Father    Asthma Father    Hypertension Father    Dementia Father    Healthy Sister    Stroke Brother    Alzheimer's disease Brother    Healthy Daughter    Hypertension Brother    Healthy Brother    Cancer Paternal Grandmother    Healthy Sister    Healthy Sister    Hypertension Sister    Hypertension Sister    Stroke Brother    Alzheimer's disease Brother    Stroke Brother    Hypertension Brother    Stroke Brother    Hypertension Brother    Healthy Brother    Healthy Brother    Hypertension Daughter    Breast cancer Neg  Hx     Social History   Tobacco Use   Smoking status: Never   Smokeless tobacco: Never   Tobacco comments:    smoking cessation materials not required  Substance Use Topics   Alcohol use: No    Alcohol/week: 0.0 standard drinks of alcohol     Current Outpatient Medications:    acetaminophen (TYLENOL) 500 MG tablet, Take 2 tablets (1,000 mg total) by mouth every 6 (six) hours as needed for mild pain or moderate pain., Disp: 30 tablet, Rfl: 0   albuterol (VENTOLIN HFA) 108 (90 Base) MCG/ACT inhaler, Inhale 2 puffs into the lungs every 4 (four) hours as needed for wheezing or shortness of breath., Disp: 8.5 each, Rfl: 11   Calcium Carb-Cholecalciferol (CALCIUM 1000 +  D PO), Take by mouth., Disp: , Rfl:    cetirizine (ZYRTEC ALLERGY) 10 MG tablet, Take 1 tablet (10 mg total) by mouth daily., Disp: 30 tablet, Rfl: 1   chlorpheniramine-HYDROcodone (TUSSIONEX PENNKINETIC ER) 10-8 MG/5ML, Take 5 mLs by mouth every 12 (twelve) hours as needed., Disp: 140 mL, Rfl: 0   clopidogrel (PLAVIX) 75 MG tablet, Take 75 mg by mouth daily., Disp: , Rfl:    diclofenac Sodium (VOLTAREN) 1 % GEL, Apply 4 grams to the skin 4 times daily. Need appt., Disp: 300 g, Rfl: 0   escitalopram (LEXAPRO) 10 MG tablet, Take 1 tablet (10 mg total) by mouth daily., Disp: 30 tablet, Rfl: 5   Evolocumab (REPATHA SURECLICK) 782 MG/ML SOAJ, Inject 140 mg into the skin every 14 (fourteen) days., Disp: 2 mL, Rfl: 11   fluticasone furoate-vilanterol (BREO ELLIPTA) 100-25 MCG/ACT AEPB, Inhale 1 puff into the lungs daily., Disp: 1 each, Rfl: 11   furosemide (LASIX) 40 MG tablet, TAKE ONE TABLET BY MOUTH EVERY MORNING, Disp: 90 tablet, Rfl: 0   ketoconazole (NIZORAL) 2 % cream, APPLY TO THE AFFECTED AREA(S) of abdominal fold ONCE daily AS NEEDED, Disp: 60 g, Rfl: 0   olmesartan-hydrochlorothiazide (BENICAR HCT) 20-12.5 MG tablet, Take 1 tablet by mouth daily., Disp: 30 tablet, Rfl: 11   pantoprazole (PROTONIX) 20 MG tablet, TAKE ONE  TABLET BY MOUTH ONCE DAILY, Disp: 30 tablet, Rfl: 11   potassium chloride SA (KLOR-CON M) 20 MEQ tablet, Take 1 tablet (20 mEq total) by mouth 2 (two) times daily., Disp: 180 tablet, Rfl: 1   pregabalin (LYRICA) 100 MG capsule, Take 1 capsule (100 mg total) by mouth 2 (two) times daily., Disp: 60 capsule, Rfl: 5  Allergies  Allergen Reactions   Ferumoxytol Anaphylaxis   Liraglutide Other (See Comments)    pancreatitis   Nsaids Hives, Rash and Nausea And Vomiting   Clopidogrel Other (See Comments)    Reports intolerance and states causes myalgias   Statins Other (See Comments) and Nausea And Vomiting    Joint pains   Oxycodone Nausea And Vomiting   Crestor [Rosuvastatin Calcium] Rash   Zetia [Ezetimibe] Other (See Comments)    Muscle weakness and joint pain    I personally reviewed active problem list, medication list, allergies, family history, social history, health maintenance with the patient/caregiver today.   ROS  ***  Objective  There were no vitals filed for this visit.  There is no height or weight on file to calculate BMI.  Physical Exam ***  No results found for this or any previous visit (from the past 2160 hour(s)).   PHQ2/9:    05/22/2022    3:02 PM 04/23/2022   11:21 AM 04/16/2022    1:19 PM 03/19/2022    9:14 AM 12/03/2021   12:35 PM  Depression screen PHQ 2/9  Decreased Interest 0 0 0 0 0  Down, Depressed, Hopeless 0 0 0 0 0  PHQ - 2 Score 0 0 0 0 0  Altered sleeping   0    Tired, decreased energy   0    Change in appetite   0    Feeling bad or failure about yourself    0    Trouble concentrating   0    Moving slowly or fidgety/restless   0    Suicidal thoughts   0    PHQ-9 Score   0      phq 9 is {gen pos [id number]}   Fall Risk:  05/22/2022    3:04 PM 04/23/2022   11:21 AM 04/16/2022    1:18 PM 03/19/2022    9:13 AM 12/03/2021   12:35 PM  [place] in the past year? 0 0 0 0 0  Number falls in past yr: 0  0 0 0  Injury with  Fall? 0  0 0 0  Risk for fall due to : No Fall Risks  No Fall Risks    Follow up Falls prevention discussed Falls prevention discussed Falls prevention discussed Falls evaluation completed Falls evaluation completed      Functional Status Survey:      Assessment & Plan  *** There are no diagnoses linked to this encounter.

## 2022-11-10 ENCOUNTER — Ambulatory Visit: Payer: 59 | Admitting: Family Medicine

## 2022-11-10 DIAGNOSIS — Z1231 Encounter for screening mammogram for malignant neoplasm of breast: Secondary | ICD-10-CM

## 2022-11-11 ENCOUNTER — Telehealth: Payer: Self-pay | Admitting: Family Medicine

## 2022-11-11 ENCOUNTER — Other Ambulatory Visit: Payer: Self-pay

## 2022-11-11 DIAGNOSIS — E785 Hyperlipidemia, unspecified: Secondary | ICD-10-CM

## 2022-11-11 DIAGNOSIS — I10 Essential (primary) hypertension: Secondary | ICD-10-CM

## 2022-11-11 DIAGNOSIS — J4520 Mild intermittent asthma, uncomplicated: Secondary | ICD-10-CM

## 2022-11-11 NOTE — Telephone Encounter (Signed)
Pt had appt on yesterday but missed it due to having eye surgery. She is out of pregablin, escitalopram, & Lasix and would like to know if you would give her enough to last until her upcoming appt on the 27th. Please send to [facility]. Please contact pt once done

## 2022-11-11 NOTE — Progress Notes (Unsigned)
Name: Molly [name]   MRN: [id number]    DOB: 03/12/1951   Date:11/12/2022       Progress Note  Subjective  Chief Complaint  Medication Refill  HPI  Asthma: mild persistent: she is on daily Breo prn  and states no longer having daily cough, wheezing or SOB. 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    Cardiomyopathy without CHF: lower extremity edema but mild and stable, she denies orthopnea, PND, chest pain  or SOB . She is on ARB   Endometrial cancer: s/p hysterectomy, finished the chemo and radiation therapy with Dr. Baruch Gouty , no longer has a port, she had a CT pelvis 10/2020 and negative for recurrence of cancer. She is up to date with visits with Dr. Janese Banks and Donella Stade, also sees GYN and last CA 125 was 11 . She has neuropathy from chemotherapy, she continues to have intermittent pain on her toes. She is up to date with follow up with oncologist . She is seeing oncologist yearly and also sees gyn  Morbid obesity: off victoza because of episode of pancreatitis,. She gained from 213 lbs to 236 lbs after treatment for endometrial cancer, weight was in the 240 lbs for months but it has now been stable between 226-228 lbs. She does not like vegetables, but likes fruit , she is not eating fried food daily and is avoiding sodas  Dyslipidemia:  She has history of TIA and CAD, we finally her started on Repatha June 2022 , she is unable to tolerate statins or Zetia, she is now using Repatha correctly - twice a month now. We will recheck labs today   HTN: bp today is towards low end of normal, she states at home last bp was 122/70's , no chest pain, dizziness, or palpitation. We will continue current dose of medication but we will need to decrease dose if  she develops orthostatic changes   Atherosclerosis aorta:  she is only on Repatha June 2022 last LDL was not at goal but at the time she was only taking once injection monthly but is now taking it twice month and we will recheck levels   GERD: symptoms are well controlled, she has been off medication for months , doing well with life style modification    Patient Active Problem List   Diagnosis Date Noted   Polymyalgia rheumatica syndrome ([place]) 08/26/2021   Major depression in remission ([place]) 04/23/2021   Morbid obesity ([place]) 04/23/2021   Peripheral neuropathy due to chemotherapy ([place]) 04/23/2021   Myalgia 03/20/2021   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 ([place]) 01/11/2019   Iron deficiency anemia 11/15/2018   Endometrial adenocarcinoma ([place]) 08/67/6195   Umbilical hernia without obstruction and without gangrene    Atherosclerosis of right coronary artery 11/10/2017   Atherosclerosis of abdominal aorta ([place]) 11/10/2017   Prediabetes 10/02/2017   Perennial allergic rhinitis with seasonal variation 05/14/2016   Asthma, mild intermittent, well-controlled 05/14/2016   Hypertension goal BP (blood pressure) < 140/90 12/10/2015   Cardiomyopathy due to hypertension ([place]) 12/10/2015   History of CVA (cerebrovascular accident) 12/10/2015   Hyperlipidemia LDL goal <70 12/10/2015   Statin intolerance 12/10/2015    Past Surgical History:  Procedure Laterality Date   ABDOMINAL HYSTERECTOMY     CHOLECYSTECTOMY     COLONOSCOPY WITH PROPOFOL N/A 01/08/2017   Procedure: COLONOSCOPY WITH PROPOFOL;  Surgeon: [name], MD;  Location: [facility] ENDOSCOPY;  Service: Endoscopy;  Laterality: N/A;   COLONOSCOPY WITH PROPOFOL N/A 05/05/2018   Procedure: COLONOSCOPY WITH PROPOFOL;  Surgeon: [name], MD;  Location[facility] ENDOSCOPY;  Service: Gastroenterology;  Laterality: N/A;   COLONOSCOPY WITH PROPOFOL N/A 07/15/2021   Procedure: COLONOSCOPY WITH PROPOFOL;  Surgeon:  [name], MD;  Location: [facility] ENDOSCOPY;  Service: Gastroenterology;  Laterality: N/A;   HERNIA REPAIR  [id number]   umbilical   PORTACATH PLACEMENT N/A 11/04/2018   Procedure: INSERTION PORT-A-CATH;  Surgeon: Jules Husbands, MD;  Location: [facility] ORS;  Service: General;  Laterality: N/A;   SENTINEL NODE BIOPSY N/A 10/06/2018   Procedure: SENTINEL NODE BIOPSY;  Surgeon: Mellody Drown, MD;  Location: [facility] ORS;  Service: Gynecology;  Laterality: N/A;   UMBILICAL HERNIA REPAIR N/A 11/17/2017   Procedure: HERNIA REPAIR UMBILICAL ADULT;  Surgeon: Jules Husbands, MD;  Location: [facility] ORS;  Service: General;  Laterality: N/A;    Family History  Problem Relation Age of Onset   Stroke Mother    Hypertension Mother    Dementia Mother    Alzheimer's disease Mother    Gout Father    Asthma Father    Hypertension Father    Dementia Father    Healthy Sister    Stroke Brother    Alzheimer's disease Brother    Healthy Daughter    Hypertension Brother    Healthy Brother    Cancer Paternal 10    Healthy Sister    Healthy Sister    Hypertension Sister    Hypertension Sister    Stroke Brother    Alzheimer's disease Brother    Stroke Brother    Hypertension Brother    Stroke Brother    Hypertension Brother    Healthy Brother    Healthy Brother    Hypertension Daughter    Breast cancer Neg Hx     Social History   Tobacco Use   Smoking status: Never   Smokeless tobacco: Never   Tobacco comments:    smoking cessation materials not required  Substance Use Topics   Alcohol use: No    Alcohol/week: 0.0 standard drinks of alcohol     Current Outpatient Medications:    acetaminophen (TYLENOL) 500 MG tablet, Take 2 tablets (1,000 mg total) by mouth every 6 (six) hours as needed for mild pain or moderate pain., Disp: 30 tablet, Rfl: 0   albuterol (VENTOLIN HFA) 108 (90 Base) MCG/ACT inhaler, Inhale 2 puffs into the lungs every 4 (four) hours as needed for wheezing or shortness of  breath., Disp: 8.5 each, Rfl: 11   Calcium Carb-Cholecalciferol (CALCIUM 1000 + D PO), Take by mouth., Disp: , Rfl:    cetirizine (ZYRTEC ALLERGY) 10 MG tablet, Take 1 tablet (10 mg total) by mouth daily., Disp: 30 tablet, Rfl: 1   chlorpheniramine-HYDROcodone (TUSSIONEX PENNKINETIC ER) 10-8 MG/5ML, Take 5 mLs by mouth every 12 (twelve) hours as needed., Disp: 140 mL, Rfl: 0   clopidogrel (PLAVIX) 75 MG tablet, Take 75 mg by mouth daily., Disp: , Rfl:    diclofenac Sodium (VOLTAREN) 1 % GEL, Apply 4 grams to the skin 4 times daily. Need appt., Disp: 300 g, Rfl: 0   escitalopram (LEXAPRO) 10 MG tablet, Take 1 tablet (10 mg total) by mouth daily., Disp: 30 tablet,  Rfl: 5   Evolocumab (REPATHA SURECLICK) 662 MG/ML SOAJ, Inject 140 mg into the skin every 14 (fourteen) days., Disp: 2 mL, Rfl: 11   fluticasone furoate-vilanterol (BREO ELLIPTA) 100-25 MCG/ACT AEPB, Inhale 1 puff into the lungs daily., Disp: 1 each, Rfl: 11   furosemide (LASIX) 40 MG tablet, TAKE ONE TABLET BY MOUTH EVERY MORNING, Disp: 90 tablet, Rfl: 0   ketoconazole (NIZORAL) 2 % cream, APPLY TO THE AFFECTED AREA(S) of abdominal fold ONCE daily AS NEEDED, Disp: 60 g, Rfl: 0   ofloxacin (OCUFLOX) 0.3 % ophthalmic solution, Place into the left eye., Disp: , Rfl:    olmesartan-hydrochlorothiazide (BENICAR HCT) 20-12.5 MG tablet, Take 1 tablet by mouth daily., Disp: 30 tablet, Rfl: 11   pantoprazole (PROTONIX) 20 MG tablet, TAKE ONE TABLET BY MOUTH ONCE DAILY, Disp: 30 tablet, Rfl: 11   potassium chloride SA (KLOR-CON M) 20 MEQ tablet, Take 1 tablet (20 mEq total) by mouth 2 (two) times daily., Disp: 180 tablet, Rfl: 1   prednisoLONE acetate (PRED FORTE) 1 % ophthalmic suspension, Place into the left eye., Disp: , Rfl:    pregabalin (LYRICA) 100 MG capsule, Take 1 capsule (100 mg total) by mouth 2 (two) times daily., Disp: 60 capsule, Rfl: 5  Allergies  Allergen Reactions   Ferumoxytol Anaphylaxis   Liraglutide Other (See Comments)     pancreatitis   Nsaids Hives, Rash and Nausea And Vomiting   Clopidogrel Other (See Comments)    Reports intolerance and states causes myalgias   Statins Other (See Comments) and Nausea And Vomiting    Joint pains   Oxycodone Nausea And Vomiting   Crestor [Rosuvastatin Calcium] Rash   Zetia [Ezetimibe] Other (See Comments)    Muscle weakness and joint pain    I personally reviewed active problem list, medication list, allergies, family history, social history, health maintenance with the patient/caregiver today.   ROS  Ten systems reviewed and is negative except as mentioned in HPI   Objective  Vitals:   11/12/22 1540  BP: 116/70  Pulse: 84  Resp: 16  Temp: 98 F (36.7 C)  TempSrc: Oral  SpO2: 92%  Weight: 226 lb 9.6 oz (102.8 kg)  Height: '4\' 11"'$  (1.499 m)    Body mass index is 45.77 kg/m.  Physical Exam  Constitutional: Patient appears well-developed and well-nourished. Obese  No distress.  HEENT: head atraumatic, normocephalic, pupils equal and reactive to light, neck supple Cardiovascular: Normal rate, regular rhythm and normal heart sounds.  No murmur heard. 1 plus BLE edema. Pulmonary/Chest: Effort normal and breath sounds normal. No respiratory distress. Abdominal: Soft.  There is no tenderness. Psychiatric: Patient has a normal mood and affect. behavior is normal. Judgment and thought content normal.    PHQ2/9:    11/12/2022    4:03 PM 05/22/2022    3:02 PM 04/23/2022   11:21 AM 04/16/2022    1:19 PM 03/19/2022    9:14 AM  Depression screen PHQ 2/9  Decreased Interest 0 0 0 0 0  Down, Depressed, Hopeless 0 0 0 0 0  PHQ - 2 Score 0 0 0 0 0  Altered sleeping 0   0   Tired, decreased energy 0   0   Change in appetite 0   0   Feeling bad or failure about yourself  0   0   Trouble concentrating 0   0   Moving slowly or fidgety/restless 0   0   Suicidal thoughts 0   0  PHQ-9 Score 0   0     phq 9 is negative   Fall Risk:    11/12/2022    4:03 PM  05/22/2022    3:04 PM 04/23/2022   11:21 AM 04/16/2022    1:18 PM 03/19/2022    9:13 AM  Fall Risk   Falls in the past year? 0 0 0 0 0  Number falls in past yr:  0  0 0  Injury with Fall?  0  0 0  Risk for fall due to : No Fall Risks No Fall Risks  No Fall Risks   Follow up Falls prevention discussed;Education provided;Falls evaluation completed Falls prevention discussed Falls prevention discussed Falls prevention discussed Falls evaluation completed      Functional Status Survey: Is the patient deaf or have difficulty hearing?: No Does the patient have difficulty seeing, even when wearing glasses/contacts?: No Does the patient have difficulty concentrating, remembering, or making decisions?: No Does the patient have difficulty walking or climbing stairs?: No Does the patient have difficulty dressing or bathing?: No Does the patient have difficulty doing errands alone such as visiting a doctor's office or shopping?: No    Assessment & Plan  1. Morbid obesity ([place])  Discussed with the patient the risk posed by an increased BMI. Discussed importance of portion control, calorie counting and at least 150 minutes of physical activity weekly. Avoid sweet beverages and drink more water. Eat at least 6 servings of fruit and vegetables daily    2. Atherosclerosis of abdominal aorta (HCC)  - Lipid panel  3. Major depression in remission ([place])  - escitalopram (LEXAPRO) 5 MG tablet; Take 1 tablet (5 mg total) by mouth daily.  Dispense: 30 tablet; Refill: 0  She wants to stop medication, we will go down to 5 mg daily for 3 weeks and after that she can take half pill and stop it  4. Cardiomyopathy due to hypertension, without heart failure (HCC)  - Lipid panel - furosemide (LASIX) 40 MG tablet; Take 1 tablet (40 mg total) by mouth daily as needed.  Dispense: 90 tablet; Refill: 0  5. Peripheral neuropathy due to chemotherapy (HCC)  - pregabalin (LYRICA) 100 MG capsule; Take 1 capsule  (100 mg total) by mouth 2 (two) times daily.  Dispense: 60 capsule; Refill: 5  6. History of CVA (cerebrovascular accident) without residual deficits  - Lipid panel  7. Need for immunization against influenza  - Flu Vaccine QUAD High Dose(Fluad)  8. Dyslipidemia  - Lipid panel  9. Long-term use of high-risk medication  - COMPLETE METABOLIC PANEL WITH GFR - CBC with Differential/Platelet  10. Hypertension goal BP (blood pressure) < 140/90  - furosemide (LASIX) 40 MG tablet; Take 1 tablet (40 mg total) by mouth daily as needed.  Dispense: 90 tablet; Refill: 0  11. Bilateral lower extremity edema  - furosemide (LASIX) 40 MG tablet; Take 1 tablet (40 mg total) by mouth daily as needed.  Dispense: 90 tablet; Refill: 0

## 2022-11-12 ENCOUNTER — Ambulatory Visit (INDEPENDENT_AMBULATORY_CARE_PROVIDER_SITE_OTHER): Payer: 59 | Admitting: Family Medicine

## 2022-11-12 ENCOUNTER — Encounter: Payer: Self-pay | Admitting: Family Medicine

## 2022-11-12 VITALS — BP 116/70 | HR 84 | Temp 98.0°F | Resp 16 | Ht 59.0 in | Wt 226.6 lb

## 2022-11-12 DIAGNOSIS — Z23 Encounter for immunization: Secondary | ICD-10-CM

## 2022-11-12 DIAGNOSIS — I700 Atherosclerosis of aorta: Secondary | ICD-10-CM

## 2022-11-12 DIAGNOSIS — R600 Localized edema: Secondary | ICD-10-CM

## 2022-11-12 DIAGNOSIS — T451X5A Adverse effect of antineoplastic and immunosuppressive drugs, initial encounter: Secondary | ICD-10-CM

## 2022-11-12 DIAGNOSIS — F325 Major depressive disorder, single episode, in full remission: Secondary | ICD-10-CM

## 2022-11-12 DIAGNOSIS — I119 Hypertensive heart disease without heart failure: Secondary | ICD-10-CM | POA: Diagnosis not present

## 2022-11-12 DIAGNOSIS — Z8673 Personal history of transient ischemic attack (TIA), and cerebral infarction without residual deficits: Secondary | ICD-10-CM

## 2022-11-12 DIAGNOSIS — Z79899 Other long term (current) drug therapy: Secondary | ICD-10-CM

## 2022-11-12 DIAGNOSIS — I43 Cardiomyopathy in diseases classified elsewhere: Secondary | ICD-10-CM

## 2022-11-12 DIAGNOSIS — G620 Drug-induced polyneuropathy: Secondary | ICD-10-CM

## 2022-11-12 DIAGNOSIS — I10 Essential (primary) hypertension: Secondary | ICD-10-CM

## 2022-11-12 DIAGNOSIS — E785 Hyperlipidemia, unspecified: Secondary | ICD-10-CM

## 2022-11-12 LAB — CBC WITH DIFFERENTIAL/PLATELET
Absolute Monocytes: 440 cells/uL (ref 200–950)
Basophils Absolute: 22 cells/uL (ref 0–200)
Basophils Relative: 0.4 %
Eosinophils Absolute: 28 cells/uL (ref 15–500)
Eosinophils Relative: 0.5 %
HCT: 37.9 % (ref 35.0–45.0)
Hemoglobin: 12.8 g/dL (ref 11.7–15.5)
Lymphs Abs: 1848 cells/uL (ref 850–3900)
MCH: 30.8 pg (ref 27.0–33.0)
MCHC: 33.8 g/dL (ref 32.0–36.0)
MCV: 91.3 fL (ref 80.0–100.0)
MPV: 10.1 fL (ref 7.5–12.5)
Monocytes Relative: 8 %
Neutro Abs: 3163 cells/uL (ref 1500–7800)
Neutrophils Relative %: 57.5 %
Platelets: 327 10*3/uL (ref 140–400)
RBC: 4.15 10*6/uL (ref 3.80–5.10)
RDW: 13.2 % (ref 11.0–15.0)
Total Lymphocyte: 33.6 %
WBC: 5.5 10*3/uL (ref 3.8–10.8)

## 2022-11-12 LAB — COMPLETE METABOLIC PANEL WITH GFR
AG Ratio: 1.6 (calc) (ref 1.0–2.5)
ALT: 20 U/L (ref 6–29)
AST: 22 U/L (ref 10–35)
Albumin: 4.5 g/dL (ref 3.6–5.1)
Alkaline phosphatase (APISO): 95 U/L (ref 37–153)
BUN/Creatinine Ratio: 16 (calc) (ref 6–22)
BUN: 17 mg/dL (ref 7–25)
CO2: 30 mmol/L (ref 20–32)
Calcium: 10.4 mg/dL (ref 8.6–10.4)
Chloride: 100 mmol/L (ref 98–110)
Creat: 1.07 mg/dL — ABNORMAL HIGH (ref 0.60–1.00)
Globulin: 2.9 g/dL (calc) (ref 1.9–3.7)
Glucose, Bld: 98 mg/dL (ref 65–99)
Potassium: 3.7 mmol/L (ref 3.5–5.3)
Sodium: 141 mmol/L (ref 135–146)
Total Bilirubin: 0.7 mg/dL (ref 0.2–1.2)
Total Protein: 7.4 g/dL (ref 6.1–8.1)
eGFR: 56 mL/min/{1.73_m2} — ABNORMAL LOW (ref >=60)

## 2022-11-12 LAB — LIPID PANEL
Cholesterol: 155 mg/dL (ref <200)
HDL: 62 mg/dL (ref >=50)
LDL Cholesterol (Calc): 70 mg/dL (calc)
Non-HDL Cholesterol (Calc): 93 mg/dL (calc) (ref <130)
Total CHOL/HDL Ratio: 2.5 (calc) (ref <5.0)
Triglycerides: 157 mg/dL — ABNORMAL HIGH (ref <150)

## 2022-11-12 MED ORDER — PREGABALIN 100 MG PO CAPS: 100.0000 mg | ORAL_CAPSULE | Freq: Two times a day (BID) | ORAL | 5 refills | Status: DC

## 2022-11-12 MED ORDER — ESCITALOPRAM OXALATE 5 MG PO TABS: 5.0000 mg | ORAL_TABLET | Freq: Every day | ORAL | 0 refills | Status: DC

## 2022-11-12 MED ORDER — FUROSEMIDE 40 MG PO TABS: 40.0000 mg | ORAL_TABLET | Freq: Every day | ORAL | 0 refills | Status: DC | PRN

## 2022-11-17 ENCOUNTER — Other Ambulatory Visit: Payer: Self-pay | Admitting: Family Medicine

## 2022-11-17 DIAGNOSIS — J4520 Mild intermittent asthma, uncomplicated: Secondary | ICD-10-CM

## 2022-11-17 DIAGNOSIS — E785 Hyperlipidemia, unspecified: Secondary | ICD-10-CM

## 2022-11-17 DIAGNOSIS — I10 Essential (primary) hypertension: Secondary | ICD-10-CM

## 2022-11-17 DIAGNOSIS — F325 Major depressive disorder, single episode, in full remission: Secondary | ICD-10-CM

## 2022-11-24 ENCOUNTER — Ambulatory Visit: Payer: 59 | Admitting: Family Medicine

## 2022-11-27 DIAGNOSIS — J45909 Unspecified asthma, uncomplicated: Secondary | ICD-10-CM

## 2022-11-27 DIAGNOSIS — I10 Essential (primary) hypertension: Secondary | ICD-10-CM

## 2022-12-01 ENCOUNTER — Telehealth: Payer: Self-pay

## 2022-12-01 DIAGNOSIS — R600 Localized edema: Secondary | ICD-10-CM

## 2022-12-01 DIAGNOSIS — F325 Major depressive disorder, single episode, in full remission: Secondary | ICD-10-CM

## 2022-12-01 MED ORDER — ESCITALOPRAM OXALATE 5 MG PO TABS: 5.0000 mg | ORAL_TABLET | Freq: Every day | ORAL | 1 refills | Status: DC

## 2022-12-01 MED ORDER — POTASSIUM CHLORIDE CRYS ER 20 MEQ PO TBCR: 20.0000 meq | EXTENDED_RELEASE_TABLET | Freq: Two times a day (BID) | ORAL | 1 refills | Status: DC

## 2022-12-01 NOTE — Progress Notes (Signed)
Chronic Care Management Pharmacy Assistant   Name: Molly [name]  MRN: [id number] DOB: Feb 17, 1951  Reason for Encounter: Medication Review/Medication Coordination Call.   Recent office visits:  11/12/2022 Dr. Ancil Boozer MD (PCP) Decrease Lexapro to 5 mg daily and wean off slowly. Change Furosemide to 40 mg PRN  Recent consult visits:  None ID  Hospital visits:  None in previous 6 months  Medications: Outpatient Encounter Medications as of 12/01/2022  Medication Sig   acetaminophen (TYLENOL) 500 MG tablet Take 2 tablets (1,000 mg total) by mouth every 6 (six) hours as needed for mild pain or moderate pain.   albuterol (VENTOLIN HFA) 108 (90 Base) MCG/ACT inhaler Inhale 2 puffs into the lungs every 4 (four) hours as needed for wheezing or shortness of breath.   Calcium Carb-Cholecalciferol (CALCIUM 1000 + D PO) Take by mouth.   cetirizine (ZYRTEC ALLERGY) 10 MG tablet Take 1 tablet (10 mg total) by mouth daily.   clopidogrel (PLAVIX) 75 MG tablet Take 75 mg by mouth daily.   diclofenac Sodium (VOLTAREN) 1 % GEL Apply 4 grams to the skin 4 times daily. Need appt.   escitalopram (LEXAPRO) 5 MG tablet Take 1 tablet (5 mg total) by mouth daily.   Evolocumab (REPATHA SURECLICK) 884 MG/ML SOAJ Inject 140 mg into the skin every 14 (fourteen) days.   fluticasone furoate-vilanterol (BREO ELLIPTA) 100-25 MCG/ACT AEPB Inhale 1 puff into the lungs daily.   furosemide (LASIX) 40 MG tablet Take 1 tablet (40 mg total) by mouth daily as needed.   ketoconazole (NIZORAL) 2 % cream APPLY TO THE AFFECTED AREA(S) of abdominal fold ONCE daily AS NEEDED   ofloxacin (OCUFLOX) 0.3 % ophthalmic solution Place into the left eye.   olmesartan-hydrochlorothiazide (BENICAR HCT) 20-12.5 MG tablet Take 1 tablet by mouth daily.   potassium chloride SA (KLOR-CON M) 20 MEQ tablet Take 1 tablet (20 mEq total) by mouth 2 (two) times daily.   prednisoLONE acetate (PRED FORTE) 1 % ophthalmic suspension Place into the left  eye.   pregabalin (LYRICA) 100 MG capsule Take 1 capsule (100 mg total) by mouth 2 (two) times daily.   No facility-administered encounter medications on file as of 12/01/2022.    Care Gaps: COVID Vaccine Mammogram     Star Rating Drugs: Olmesartan-HCTZ 20-12.5 mg last filled on 11/05/2022 for 30 day supply at [facility].  Reviewed chart for medication changes ahead of medication coordination call.  BP Readings from Last 3 Encounters:  11/12/22 116/70  08/20/22 (!) 168/73  07/23/22 (!) 148/76    Lab Results  Component Value Date   HGBA1C 6.2 (H) 04/16/2022     Patient obtains medications through Adherence Packaging  30 Days   Last adherence delivery included: Escitalopram 10 mg one tablet daily-  Breakfast   Pregabalin 100 mg one Capsule two times daily- Breakfast,Evening meals    Olmesartan-HCTZ 20-12.5 mg one tablet daily -Breakfast   Potassium Chloride 20 MEQ 1 tablet  two times daily-Breakfast, evening meals   Pantoprazole  20 mg one tablet daily- Breakfast    Repatha 140 mg Inject 140 mg into the skin every 14 (fourteen) days   Calcium/Vitamin D3 315-250 once daily- Breakfast   Furosemide 40 mg one tablet daily (Patient prefers Vials)  Patient declined medications last month: Ventolin HFA 108 MCG/ACT -PRN - Adequate Supply   Breo 100-25 MCG/ Inhaler - 1 puff daily  - Adequate Supply  Patient is due for next adherence delivery on: 12/11/2022. Called patient and  reviewed medications and coordinated delivery.  This delivery to include: Escitalopram 5 mg one tablet daily-  Breakfast   Pregabalin 100 mg one Capsule two times daily- Breakfast,Evening meals    Olmesartan-HCTZ 20-12.5 mg one tablet daily -Breakfast   Potassium Chloride 20 MEQ 1 tablet  two times daily-Breakfast, evening meals   Pantoprazole  20 mg one tablet daily- Breakfast    Repatha 140 mg Inject 140 mg into the skin every 14 (fourteen) days   Calcium/Vitamin D3 315-250 once  daily- Breakfast   Patient declined the following medications: Furosemide 40 mg one tablet daily PRN- Adequate supply (Patient prefers Vials)  Ventolin HFA 108 MCG/ACT -PRN - Adequate Supply   Breo 100-25 MCG/ Inhaler - 1 puff daily  - Adequate Supply - patient states she has 1 inhaler on hand that she has not open yet.  Patient needs refills for Escitalopram, Potassium Chloride prescribe by PCP .  Confirmed delivery date of 12/11/2022 ([facility]), advised patient that pharmacy will contact them the morning of delivery.  [facility] Pharmacist Assistant [phone]

## 2022-12-01 NOTE — Addendum Note (Signed)
Addended by: Daron Offer A on: 12/01/2022 10:29 AM   Modules accepted: Orders

## 2022-12-15 ENCOUNTER — Telehealth: Payer: Self-pay

## 2022-12-15 NOTE — Progress Notes (Signed)
  Chronic Care Management   Note  00/37/0488 Name: Molly [name] MRN: [id number] DOB: [birth year]  Molly [name] is a 71 y.o. year old female who is a primary care patient of Steele Sizer, MD. I reached out to [name] by phone today in response to a referral sent by Molly [name] PCP.  Molly [name]  agreedto scheduling an appointment with the CCM RN Case Manager   Follow up plan: Patient agreed to scheduled appointment with RN Case Manager on 01/12/2023(date/time).   [name], [name], [name] [address] Direct Dial: [phone] [name]

## 2023-01-12 ENCOUNTER — Telehealth: Payer: 59

## 2023-01-12 ENCOUNTER — Telehealth: Payer: Self-pay

## 2023-01-12 NOTE — Telephone Encounter (Signed)
   CCM RN Visit Note   [birth year] Name: Molly [name] MRN: [id number]      DOB: [birth year]  Subjective: Molly [name] is a 72 y.o. year old female who is a primary care patient of Steele Sizer, MD. The patient was referred to the Chronic Care Management team for assistance with care management needs subsequent to provider initiation of CCM services and plan of care.      An unsuccessful outreach attempt was made today for a scheduled CCM visit.   PLAN: A HIPAA compliant phone message was left for the patient providing contact information and requesting a return call.   Horris Latino RN Care Manager/Chronic Care Management [phone]

## 2023-01-30 ENCOUNTER — Telehealth: Payer: 59

## 2023-02-02 ENCOUNTER — Other Ambulatory Visit: Payer: Self-pay | Admitting: Family Medicine

## 2023-02-02 DIAGNOSIS — I10 Essential (primary) hypertension: Secondary | ICD-10-CM

## 2023-02-02 DIAGNOSIS — Z8673 Personal history of transient ischemic attack (TIA), and cerebral infarction without residual deficits: Secondary | ICD-10-CM

## 2023-02-13 ENCOUNTER — Other Ambulatory Visit: Payer: Self-pay

## 2023-02-13 ENCOUNTER — Telehealth: Payer: 59

## 2023-02-13 DIAGNOSIS — C541 Malignant neoplasm of endometrium: Secondary | ICD-10-CM

## 2023-02-13 NOTE — Addendum Note (Signed)
Addended by: Harvel Ricks A on: 02/13/2023 09:10 AM   Modules accepted: Orders

## 2023-02-16 ENCOUNTER — Inpatient Hospital Stay (HOSPITAL_BASED_OUTPATIENT_CLINIC_OR_DEPARTMENT_OTHER): Payer: 59 | Admitting: Oncology

## 2023-02-16 ENCOUNTER — Encounter: Payer: Self-pay | Admitting: Oncology

## 2023-02-16 ENCOUNTER — Inpatient Hospital Stay: Payer: 59 | Attending: Oncology

## 2023-02-16 DIAGNOSIS — Z9221 Personal history of antineoplastic chemotherapy: Secondary | ICD-10-CM | POA: Diagnosis not present

## 2023-02-16 DIAGNOSIS — G620 Drug-induced polyneuropathy: Secondary | ICD-10-CM | POA: Insufficient documentation

## 2023-02-16 DIAGNOSIS — Z8542 Personal history of malignant neoplasm of other parts of uterus: Secondary | ICD-10-CM | POA: Insufficient documentation

## 2023-02-16 DIAGNOSIS — Z08 Encounter for follow-up examination after completed treatment for malignant neoplasm: Secondary | ICD-10-CM

## 2023-02-16 DIAGNOSIS — Z90710 Acquired absence of both cervix and uterus: Secondary | ICD-10-CM | POA: Diagnosis not present

## 2023-02-16 DIAGNOSIS — C541 Malignant neoplasm of endometrium: Secondary | ICD-10-CM

## 2023-02-16 DIAGNOSIS — Z7902 Long term (current) use of antithrombotics/antiplatelets: Secondary | ICD-10-CM | POA: Insufficient documentation

## 2023-02-16 DIAGNOSIS — Z7951 Long term (current) use of inhaled steroids: Secondary | ICD-10-CM | POA: Diagnosis not present

## 2023-02-16 DIAGNOSIS — T451X5S Adverse effect of antineoplastic and immunosuppressive drugs, sequela: Secondary | ICD-10-CM | POA: Insufficient documentation

## 2023-02-16 DIAGNOSIS — Z79899 Other long term (current) drug therapy: Secondary | ICD-10-CM | POA: Insufficient documentation

## 2023-02-16 DIAGNOSIS — Z923 Personal history of irradiation: Secondary | ICD-10-CM | POA: Insufficient documentation

## 2023-02-16 LAB — COMPREHENSIVE METABOLIC PANEL
ALT: 13 U/L (ref 0–44)
AST: 20 U/L (ref 15–41)
Albumin: 4 g/dL (ref 3.5–5.0)
Alkaline Phosphatase: 73 U/L (ref 38–126)
Anion gap: 8 (ref 5–15)
BUN: 14 mg/dL (ref 8–23)
CO2: 27 mmol/L (ref 22–32)
Calcium: 9 mg/dL (ref 8.9–10.3)
Chloride: 103 mmol/L (ref 98–111)
Creatinine, Ser: 0.77 mg/dL (ref 0.44–1.00)
GFR, Estimated: >60 mL/min (ref >60)
Glucose, Bld: 98 mg/dL (ref 70–99)
Potassium: 3.7 mmol/L (ref 3.5–5.1)
Sodium: 138 mmol/L (ref 135–145)
Total Bilirubin: 0.7 mg/dL (ref 0.3–1.2)
Total Protein: 7.4 g/dL (ref 6.5–8.1)

## 2023-02-16 LAB — CBC WITH DIFFERENTIAL/PLATELET
Abs Immature Granulocytes: 0.01 10*3/uL (ref 0.00–0.07)
Basophils Absolute: 0 10*3/uL (ref 0.0–0.1)
Basophils Relative: 0 %
Eosinophils Absolute: 0.1 10*3/uL (ref 0.0–0.5)
Eosinophils Relative: 1 %
HCT: 37.8 % (ref 36.0–46.0)
Hemoglobin: 12.1 g/dL (ref 12.0–15.0)
Immature Granulocytes: 0 %
Lymphocytes Relative: 41 %
Lymphs Abs: 1.9 10*3/uL (ref 0.7–4.0)
MCH: 29.9 pg (ref 26.0–34.0)
MCHC: 32 g/dL (ref 30.0–36.0)
MCV: 93.3 fL (ref 80.0–100.0)
Monocytes Absolute: 0.4 10*3/uL (ref 0.1–1.0)
Monocytes Relative: 8 %
Neutro Abs: 2.3 10*3/uL (ref 1.7–7.7)
Neutrophils Relative %: 50 %
Platelets: 276 10*3/uL (ref 150–400)
RBC: 4.05 MIL/uL (ref 3.87–5.11)
RDW: 12.8 % (ref 11.5–15.5)
WBC: 4.6 10*3/uL (ref 4.0–10.5)
nRBC: 0 % (ref 0.0–0.2)

## 2023-02-16 NOTE — Progress Notes (Signed)
No concerns for the provider today.

## 2023-02-16 NOTE — Progress Notes (Signed)
Hematology/Oncology Consult note [facility]  Telephone:([phone] [phone]  Patient Care Team: Steele Sizer, MD as PCP - General (Family Medicine) Kate Sable, MD as PCP - Cardiology (Cardiology) Rubie Maid, MD as Consulting Physician (Obstetrics and Gynecology) Clent Jacks, RN as Registered Nurse Noreene Filbert, MD as Referring Physician (Radiation Oncology) Sindy Guadeloupe, MD as Consulting Physician (Oncology) Mellody Drown, MD as Referring Physician (Obstetrics) Germaine Pomfret, [facility] (Pharmacist) Vertell Limber (Neurology) Ventura Sellers, MD as Consulting Physician (Oncology)   Name of the patient: Molly [name]  [id number]  Aug 10, 1951   Date of visit: 02/16/23  Diagnosis- 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 Marcelline Mates for cystocele and rectocele.  She was noted to have postmenopausal bleeding and cramping recently.  Ultrasound revealed heterogeneous appearance of uterus with fibroids invading the endometrium.  She underwent an endometrial biopsy when she had a second bout of postmenopausal bleeding.  Biopsy showed endometrioid adenocarcinoma FIGO grade 1.  She was then seen by Dr. Fransisca Connors and plan was to proceed with laparoscopic hysterectomy and bilateral salpingo-oophorectomy along with pelvic lymph node sampling   Final pathology showed: Endometrioid carcinoma FIGO grade 2 with 33% myometrial invasion.  Lymphovascular invasion present.  Peritoneal/ascites fluid atypical.  2 out of 3 sentinel lymph nodes were positive for macro metastases.  pT1a pN1a.  FIGO IIIC1   Patient's case was discussed at tumor board and consensus was to proceed with 3 cycles of adjuvant carbotaxol followed by radiation followed  by 3 more cycles of chemotherapy.  Patient is already met with Dr. Donella Stade who will be giving radiation therapy to pelvic and periaortic lymph nodes and boost to her vaginal cuff.  PET CT scan showed metastatic adenopathy and retroperitoneal and external iliac group of lymph nodes in the right side.  No evidence of metastatic disease elsewhere.   First cycle of chemotherapy was given on 11/05/2018.  She completed 6 cycles on 02/25/2019.  She completed whole pelvic radiation in June 2020.    Interval history-she is currently doing a full-time job at [employer] unable to manage it well.  Denies any nausea vomiting diarrhea abdominal pain or bloating.  ECOG PS- 1 Pain scale- 0   Review of systems- Review of Systems  Constitutional:  Positive for malaise/fatigue. Negative for chills, fever and weight loss.  HENT:  Negative for congestion, ear discharge and nosebleeds.   Eyes:  Negative for blurred vision.  Respiratory:  Negative for cough, hemoptysis, sputum production, shortness of breath and wheezing.   Cardiovascular:  Negative for chest pain, palpitations, orthopnea and claudication.  Gastrointestinal:  Negative for abdominal pain, blood in stool, constipation, diarrhea, heartburn, melena, nausea and vomiting.  Genitourinary:  Negative for dysuria, flank pain, frequency, hematuria and urgency.  Musculoskeletal:  Negative for back pain, joint pain and myalgias.  Skin:  Negative for rash.  Neurological:  Negative for dizziness, tingling, focal weakness, seizures, weakness and headaches.  Endo/Heme/Allergies:  Does not bruise/bleed easily.  Psychiatric/Behavioral:  Negative for depression and suicidal ideas. The patient does not have insomnia.       Allergies  Allergen Reactions   Ferumoxytol Anaphylaxis   Liraglutide Other (See Comments)    pancreatitis   Nsaids Hives, Rash and Nausea And Vomiting   Clopidogrel Other (See Comments)  Reports intolerance and states causes myalgias    Statins Other (See Comments) and Nausea And Vomiting    Joint pains   Oxycodone Nausea And Vomiting   Crestor [Rosuvastatin Calcium] Rash   Zetia [Ezetimibe] Other (See Comments)    Muscle weakness and joint pain     Past Medical History:  Diagnosis Date   Anxiety    Asthma    history of asthma   Cardiomyopathy ([place])    Complication of anesthesia    tore hair out and made teeth rough   COVID-19 virus infection 09/08/2019   Depression    Endometrial cancer ([place]) 08/2018   GERD (gastroesophageal reflux disease)    takes prilosec prn   History of CVA (cerebrovascular accident) 12/10/2015   Hyperlipidemia LDL goal <70 12/10/2015   Hypertension    Prediabetes 10/02/2017   A1c 6 in January 2018   Stroke [facility]) 1990    no residual effects     Past Surgical History:  Procedure Laterality Date   ABDOMINAL HYSTERECTOMY     CHOLECYSTECTOMY     COLONOSCOPY WITH PROPOFOL N/A 01/08/2017   Procedure: COLONOSCOPY WITH PROPOFOL;  Surgeon: [name], MD;  Location: [facility] ENDOSCOPY;  Service: Endoscopy;  Laterality: N/A;   COLONOSCOPY WITH PROPOFOL N/A 05/05/2018   Procedure: COLONOSCOPY WITH PROPOFOL;  Surgeon: [name], MD;  [facility] ENDOSCOPY;  Service: Gastroenterology;  Laterality: N/A;   COLONOSCOPY WITH PROPOFOL N/A 07/15/2021   Procedure: COLONOSCOPY WITH PROPOFOL;  Surgeon: [name], MD;  Location: [facility] ENDOSCOPY;  Service: Gastroenterology;  Laterality: N/A;   HERNIA REPAIR  [id number]   umbilical   PORTACATH PLACEMENT N/A 11/04/2018   Procedure: INSERTION PORT-A-CATH;  Surgeon: Jules Husbands, MD;  Location: [facility] ORS;  Service: General;  Laterality: N/A;   SENTINEL NODE BIOPSY N/A 10/06/2018   Procedure: SENTINEL NODE BIOPSY;  Surgeon: Mellody Drown, MD;  Location: [facility] ORS;  Service: Gynecology;  Laterality: N/A;   UMBILICAL HERNIA REPAIR N/A 11/17/2017   Procedure: HERNIA REPAIR UMBILICAL ADULT;  Surgeon: Jules Husbands, MD;  Location: [facility] ORS;  Service: General;   Laterality: N/A;    Social History   Socioeconomic History   Marital status: Divorced    Spouse name: Not on file   Number of children: 2   Years of education: some college   Highest education level: 12th grade  Occupational History   Occupation: Retired    Comment: worked 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   Occupation: currently a Theme park manager  Tobacco Use   Smoking status: Never   Smokeless tobacco: Never   Tobacco comments:    smoking cessation materials not required  Vaping Use   Vaping Use: Never used  Substance and Sexual Activity   Alcohol use: No    Alcohol/week: 0.0 standard drinks of alcohol   Drug use: No   Sexual activity: Not Currently  Other Topics Concern   Not on file  Social History Narrative   Pt lives alone but currently staying with her daughter   Social Determinants of Health   Financial Resource Strain: [place]  (05/22/2022)   Overall Financial Resource Strain (CARDIA)    Difficulty of Paying Living Expenses: Not hard at all  Food Insecurity: No Food Insecurity (05/22/2022)   Hunger Vital Sign    Worried About Running Out of Food in the Last Year: Never true    [place] in the Last Year: Never true  Transportation Needs: No Transportation Needs (05/22/2022)   PRAPARE -  Hydrologist (Medical): No    Lack of Transportation (Non-Medical): No  Physical Activity: Inactive (05/22/2022)   Exercise Vital Sign    Days of Exercise per Week: 0 days    Minutes of Exercise per Session: 0 min  Stress: No Stress Concern Present (05/22/2022)   [place]    Feeling of Stress : Not at all  Social Connections: Moderately Integrated (05/22/2022)   Social Connection and Isolation Panel [NHANES]    Frequency of Communication with Friends and Family: More than three times a week    Frequency of Social Gatherings with Friends and Family: Three times a week    Attends Religious Services:  More than 4 times per year    Active Member of Clubs or Organizations: Yes    Attends Archivist Meetings: More than 4 times per year    Marital Status: Divorced  Intimate Partner Violence: Not At Risk (05/22/2022)   Humiliation, Afraid, Rape, and Kick questionnaire    Fear of Current or Ex-Partner: No    Emotionally Abused: No    Physically Abused: No    Sexually Abused: No    Family History  Problem Relation Age of Onset   Stroke Mother    Hypertension Mother    Dementia Mother    Alzheimer's disease Mother    Gout Father    Asthma Father    Hypertension Father    Dementia Father    Healthy Sister    Stroke Brother    Alzheimer's disease Brother    Healthy Daughter    Hypertension Brother    Healthy Brother    Cancer Paternal 30    Healthy Sister    Healthy Sister    Hypertension Sister    Hypertension Sister    Stroke Brother    Alzheimer's disease Brother    Stroke Brother    Hypertension Brother    Stroke Brother    Hypertension Brother    Healthy Brother    Healthy Brother    Hypertension Daughter    Breast cancer Neg Hx      Current Outpatient Medications:    acetaminophen (TYLENOL) 500 MG tablet, Take 2 tablets (1,000 mg total) by mouth every 6 (six) hours as needed for mild pain or moderate pain., Disp: 30 tablet, Rfl: 0   albuterol (VENTOLIN HFA) 108 (90 Base) MCG/ACT inhaler, Inhale 2 puffs into the lungs every 4 (four) hours as needed for wheezing or shortness of breath., Disp: 8.5 each, Rfl: 11   Calcium Carb-Cholecalciferol (CALCIUM 1000 + D PO), Take by mouth., Disp: , Rfl:    cetirizine (ZYRTEC ALLERGY) 10 MG tablet, Take 1 tablet (10 mg total) by mouth daily., Disp: 30 tablet, Rfl: 1   clopidogrel (PLAVIX) 75 MG tablet, Take 75 mg by mouth daily., Disp: , Rfl:    diclofenac Sodium (VOLTAREN) 1 % GEL, Apply 4 grams to the skin 4 times daily. Need appt., Disp: 300 g, Rfl: 0   escitalopram (LEXAPRO) 5 MG tablet, Take 1 tablet (5 mg  total) by mouth daily., Disp: 90 tablet, Rfl: 1   Evolocumab (REPATHA SURECLICK) [id number] MG/ML SOAJ, inject 153m into THE SKIN every 14 DAYS, Disp: 2 mL, Rfl: 1   fluticasone furoate-vilanterol (BREO ELLIPTA) 100-25 MCG/ACT AEPB, Inhale 1 puff into the lungs daily., Disp: 1 each, Rfl: 11   furosemide (LASIX) 40 MG tablet, Take 1 tablet (40 mg total) by mouth daily  as needed., Disp: 90 tablet, Rfl: 0   ketoconazole (NIZORAL) 2 % cream, APPLY TO THE AFFECTED AREA(S) of abdominal fold ONCE daily AS NEEDED, Disp: 60 g, Rfl: 0   ofloxacin (OCUFLOX) 0.3 % ophthalmic solution, Place into the left eye., Disp: , Rfl:    olmesartan-hydrochlorothiazide (BENICAR HCT) 20-12.5 MG tablet, TAKE ONE TABLET BY MOUTH EVERY MORNING, Disp: 30 tablet, Rfl: 1   potassium chloride SA (KLOR-CON M) 20 MEQ tablet, Take 1 tablet (20 mEq total) by mouth 2 (two) times daily., Disp: 180 tablet, Rfl: 1   prednisoLONE acetate (PRED FORTE) 1 % ophthalmic suspension, Place into the left eye., Disp: , Rfl:    pregabalin (LYRICA) 100 MG capsule, Take 1 capsule (100 mg total) by mouth 2 (two) times daily., Disp: 60 capsule, Rfl: 5  Physical exam:  Vitals:   02/16/23 1414 02/16/23 1420  BP: (!) 156/88 (!) 142/69  Pulse: (!) 57 (!) 57  Resp: 18   Temp: (!) 96.9 F (36.1 C)   TempSrc: Tympanic   SpO2: 98%   Weight: 232 lb 3.2 oz (105.3 kg)   Height: 4' 11"$  (1.499 m)    Physical Exam Cardiovascular:     Rate and Rhythm: Normal rate and regular rhythm.     Heart sounds: Normal heart sounds.  Pulmonary:     Effort: Pulmonary effort is normal.     Breath sounds: Normal breath sounds.  Abdominal:     General: Bowel sounds are normal.     Palpations: Abdomen is soft.  Skin:    General: Skin is warm and dry.  Neurological:     Mental Status: She is alert and oriented to person, place, and time.         Latest Ref Rng & Units 11/12/2022    4:17 PM  CMP  Glucose 65 - 99 mg/dL 98   BUN 7 - 25 mg/dL 17   Creatinine 0.60 -  1.00 mg/dL 1.07   Sodium 135 - 146 mmol/L 141   Potassium 3.5 - 5.3 mmol/L 3.7   Chloride 98 - 110 mmol/L 100   CO2 20 - 32 mmol/L 30   Calcium 8.6 - 10.4 mg/dL 10.4   Total Protein 6.1 - 8.1 g/dL 7.4   Total Bilirubin 0.2 - 1.2 mg/dL 0.7   AST 10 - 35 U/L 22   ALT 6 - 29 U/L 20       Latest Ref Rng & Units 11/12/2022    4:17 PM  CBC  WBC 3.8 - 10.8 Thousand/uL 5.5   Hemoglobin 11.7 - 15.5 g/dL 12.8   Hematocrit 35.0 - 45.0 % 37.9   Platelets 140 - 400 Thousand/uL 327      Assessment and plan- Patient is a 72 y.o. female with stage IIIc grade 2 endometrial carcinoma with bilateral positive pelvic sentinel lymph node s/p TLH/BSO with pelvic sentinel lymph node biopsies in October 2019 followed by adjuvant chemotherapy and radiation treatment.  She is currently in remission and this is a routine follow-up visit  Clinically patient is doing well with no concerning signs and symptoms of recurrence based on today's exam.  She was seen by Dr. Fransisca Connors 05 August 2022 and did not have any evidence of recurrence based on pelvic exam.  She will be seeing them annually.  I will see her back in 1 year with CBC with differential CMP and CA125.  Chemo-induced peripheral neuropathy: Overall mild and stable on Lyrica.   Visit Diagnosis 1. Encounter for follow-up  surveillance of endometrial cancer      Dr. Randa Evens, MD, MPH [facility] [id number] 02/16/2023 2:03 PM

## 2023-02-17 LAB — CA 125: Cancer Antigen (CA) 125: 10.4 U/mL (ref 0.0–38.1)

## 2023-03-15 ENCOUNTER — Emergency Department: Payer: 59

## 2023-03-15 ENCOUNTER — Emergency Department
Admission: EM | Admit: 2023-03-15 | Discharge: 2023-03-16 | Disposition: A | Discharge status: Home / Self Care | Payer: 59 | Attending: Emergency Medicine | Admitting: Emergency Medicine

## 2023-03-15 DIAGNOSIS — Z7902 Long term (current) use of antithrombotics/antiplatelets: Secondary | ICD-10-CM | POA: Diagnosis not present

## 2023-03-15 DIAGNOSIS — I10 Essential (primary) hypertension: Secondary | ICD-10-CM | POA: Insufficient documentation

## 2023-03-15 DIAGNOSIS — J45909 Unspecified asthma, uncomplicated: Secondary | ICD-10-CM | POA: Diagnosis not present

## 2023-03-15 DIAGNOSIS — Z8542 Personal history of malignant neoplasm of other parts of uterus: Secondary | ICD-10-CM | POA: Diagnosis not present

## 2023-03-15 DIAGNOSIS — Z20822 Contact with and (suspected) exposure to covid-19: Secondary | ICD-10-CM | POA: Insufficient documentation

## 2023-03-15 DIAGNOSIS — Z8616 Personal history of COVID-19: Secondary | ICD-10-CM | POA: Insufficient documentation

## 2023-03-15 DIAGNOSIS — R059 Cough, unspecified: Secondary | ICD-10-CM | POA: Diagnosis present

## 2023-03-15 DIAGNOSIS — J121 Respiratory syncytial virus pneumonia: Secondary | ICD-10-CM | POA: Insufficient documentation

## 2023-03-15 LAB — BASIC METABOLIC PANEL
Anion gap: 9 (ref 5–15)
BUN: 15 mg/dL (ref 8–23)
CO2: 27 mmol/L (ref 22–32)
Calcium: 9.3 mg/dL (ref 8.9–10.3)
Chloride: 99 mmol/L (ref 98–111)
Creatinine, Ser: 0.87 mg/dL (ref 0.44–1.00)
GFR, Estimated: >60 mL/min (ref >60)
Glucose, Bld: 120 mg/dL — ABNORMAL HIGH (ref 70–99)
Potassium: 3.8 mmol/L (ref 3.5–5.1)
Sodium: 135 mmol/L (ref 135–145)

## 2023-03-15 LAB — RESP PANEL BY RT-PCR (RSV, FLU A&B, COVID)  RVPGX2
Influenza A by PCR: NEGATIVE
Influenza B by PCR: NEGATIVE
Resp Syncytial Virus by PCR: POSITIVE — AB
SARS Coronavirus 2 by RT PCR: NEGATIVE

## 2023-03-15 LAB — CBC
HCT: 40.6 % (ref 36.0–46.0)
Hemoglobin: 13.1 g/dL (ref 12.0–15.0)
MCH: 30 pg (ref 26.0–34.0)
MCHC: 32.3 g/dL (ref 30.0–36.0)
MCV: 92.9 fL (ref 80.0–100.0)
Platelets: 350 10*3/uL (ref 150–400)
RBC: 4.37 MIL/uL (ref 3.87–5.11)
RDW: 13.3 % (ref 11.5–15.5)
WBC: 11.8 10*3/uL — ABNORMAL HIGH (ref 4.0–10.5)
nRBC: 0 % (ref 0.0–0.2)

## 2023-03-15 LAB — GROUP A STREP BY PCR: Group A Strep by PCR: NOT DETECTED

## 2023-03-15 MED ORDER — ACETAMINOPHEN 325 MG PO TABS
650.0000 mg | ORAL_TABLET | Freq: Once | ORAL | Status: AC | PRN
  Administered 2023-03-15: 650 mg via ORAL
  Filled 2023-03-15: qty 2

## 2023-03-15 MED ORDER — GUAIFENESIN-CODEINE 100-10 MG/5ML PO SOLN
5.0000 mL | Freq: Once | ORAL | Status: AC
  Administered 2023-03-15: 5 mL via ORAL
  Filled 2023-03-15: qty 5

## 2023-03-15 MED ORDER — ALBUTEROL SULFATE HFA 108 (90 BASE) MCG/ACT IN AERS: 2.0000 | INHALATION_SPRAY | RESPIRATORY_TRACT | Status: DC | PRN

## 2023-03-15 NOTE — ED Notes (Signed)
Granddaughter reports patient took benadryl at 14.

## 2023-03-15 NOTE — ED Provider Notes (Signed)
[facility] Provider Note    Event Date/Time   First MD Initiated Contact with Patient 03/15/23 2301     (approximate)   History   Cough and Fatigue   HPI  Molly [name] is a 72 y.o. female with history of asthma, cardiomyopathy, hypertension, hyperlipidemia, CVA, PMR on Repatha who presents to the emergency department family for complaints of cough, headache today.  Febrile on arrival to the ED.  Does not wear oxygen.   History provided by patient, granddaughter.    Past Medical History:  Diagnosis Date   Anxiety    Asthma    history of asthma   Cardiomyopathy ([facility])    Complication of anesthesia    tore hair out and made teeth rough   COVID-19 virus infection 09/08/2019   Depression    Endometrial cancer ([facility]) 08/2018   GERD (gastroesophageal reflux disease)    takes prilosec prn   History of CVA (cerebrovascular accident) 12/10/2015   Hyperlipidemia LDL goal <70 12/10/2015   Hypertension    Prediabetes 10/02/2017   A1c 6 in January 2018   Stroke [facility]) 1990    no residual effects    Past Surgical History:  Procedure Laterality Date   ABDOMINAL HYSTERECTOMY     CHOLECYSTECTOMY     COLONOSCOPY WITH PROPOFOL N/A 01/08/2017   Procedure: COLONOSCOPY WITH PROPOFOL;  Surgeon: Wyline Mood, MD;  Location: [facility] ENDOSCOPY;  Service: Endoscopy;  Laterality: N/A;   COLONOSCOPY WITH PROPOFOL N/A 05/05/2018   Procedure: COLONOSCOPY WITH PROPOFOL;  Surgeon: Wyline Mood, MD;  Location: [facility] ENDOSCOPY;  Service: Gastroenterology;  Laterality: N/A;   COLONOSCOPY WITH PROPOFOL N/A 07/15/2021   Procedure: COLONOSCOPY WITH PROPOFOL;  Surgeon: Wyline Mood, MD;  Location: [facility] ENDOSCOPY;  Service: Gastroenterology;  Laterality: N/A;   HERNIA REPAIR  10/2017   umbilical   PORTACATH PLACEMENT N/A 11/04/2018   Procedure: INSERTION PORT-A-CATH;  Surgeon: Leafy Ro, MD;  Location: [facility] ORS;  Service: General;  Laterality: N/A;   SENTINEL NODE BIOPSY N/A  10/06/2018   Procedure: SENTINEL NODE BIOPSY;  Surgeon: Leida Lauth, MD;  Location: [facility] ORS;  Service: Gynecology;  Laterality: N/A;   UMBILICAL HERNIA REPAIR N/A 11/17/2017   Procedure: HERNIA REPAIR UMBILICAL ADULT;  Surgeon: Leafy Ro, MD;  Location: [facility] ORS;  Service: General;  Laterality: N/A;    MEDICATIONS:  Prior to Admission medications   Medication Sig Start Date End Date Taking? Authorizing Provider  acetaminophen (TYLENOL) 500 MG tablet Take 2 tablets (1,000 mg total) by mouth every 6 (six) hours as needed for mild pain or moderate pain. 10/06/18   Hildred Laser, MD  albuterol (VENTOLIN HFA) 108 (90 Base) MCG/ACT inhaler Inhale 2 puffs into the lungs every 4 (four) hours as needed for wheezing or shortness of breath. 10/07/22   Alba Cory, MD  Calcium Carb-Cholecalciferol (CALCIUM 1000 + D PO) Take by mouth.    [provider]  cetirizine (ZYRTEC ALLERGY) 10 MG tablet Take 1 tablet (10 mg total) by mouth daily. 12/03/21   Berniece Salines, FNP  clopidogrel (PLAVIX) 75 MG tablet Take 75 mg by mouth daily.    [provider]  diclofenac Sodium (VOLTAREN) 1 % GEL Apply 4 grams to the skin 4 times daily. Need appt. 09/10/21   Alba Cory, MD  escitalopram (LEXAPRO) 5 MG tablet Take 1 tablet (5 mg total) by mouth daily. 12/01/22   Alba Cory, MD  Evolocumab (REPATHA SURECLICK) 140 MG/ML SOAJ inject 140mg  into THE  SKIN every 14 DAYS 02/02/23   Sowles, Danna Hefty, MD  fluticasone furoate-vilanterol (BREO ELLIPTA) 100-25 MCG/ACT AEPB Inhale 1 puff into the lungs daily. 03/19/22   Caro Laroche, DO  furosemide (LASIX) 40 MG tablet Take 1 tablet (40 mg total) by mouth daily as needed. 11/12/22   Alba Cory, MD  ketoconazole (NIZORAL) 2 % cream APPLY TO THE AFFECTED AREA(S) of abdominal fold ONCE daily AS NEEDED 01/06/22   Carlynn Purl, Danna Hefty, MD  ofloxacin (OCUFLOX) 0.3 % ophthalmic solution Place into the left eye. 11/06/22   [provider]   olmesartan-hydrochlorothiazide (BENICAR HCT) 20-12.5 MG tablet TAKE ONE TABLET BY MOUTH EVERY MORNING 02/02/23   Carlynn Purl, Danna Hefty, MD  pantoprazole (PROTONIX) 20 MG tablet Take 20 mg by mouth daily. 02/04/23   [provider]  potassium chloride SA (KLOR-CON M) 20 MEQ tablet Take 1 tablet (20 mEq total) by mouth 2 (two) times daily. 12/01/22   Alba Cory, MD  prednisoLONE acetate (PRED FORTE) 1 % ophthalmic suspension Place into the left eye. 11/06/22   [provider]  pregabalin (LYRICA) 100 MG capsule Take 1 capsule (100 mg total) by mouth 2 (two) times daily. 11/12/22   Alba Cory, MD    Physical Exam   Triage Vital Signs: ED Triage Vitals  Enc Vitals Group     BP 03/15/23 2056 (!) 161/114     Pulse Rate 03/15/23 2056 (!) 110     Resp 03/15/23 2056 17     Temp 03/15/23 2056 (!) 101 F (38.3 C)     Temp Source 03/15/23 2056 Oral     SpO2 03/15/23 2055 96 %     Weight 03/15/23 2056 223 lb (101.2 kg)     Height 03/15/23 2056 4\' 11"  (1.499 m)     Head Circumference --      Peak Flow --      Pain Score 03/15/23 2056 8     Pain Loc --      Pain Edu? --      Excl. in GC? --     Most recent vital signs: Vitals:   03/15/23 2307 03/16/23 0238  BP: (!) 158/81 (!) 154/84  Pulse: 87 88  Resp: (!) 23 (!) 22  Temp: 99.1 F (37.3 C) 98.9 F (37.2 C)  SpO2: 95% 95%    CONSTITUTIONAL: Alert, responds appropriately to questions.  Elderly, coughing intermittently but no production of sputum HEAD: Normocephalic, atraumatic EYES: Conjunctivae clear, pupils appear equal, sclera nonicteric ENT: normal nose; moist mucous membranes NECK: Supple, normal ROM CARD: RRR; S1 and S2 appreciated RESP: Normal chest excursion without splinting or tachypnea; breath sounds clear and equal bilaterally; no wheezes, no rhonchi, no rales, no hypoxia or respiratory distress, speaking full sentences ABD/GI: Non-distended; soft, non-tender, no rebound, no guarding, no peritoneal  signs BACK: The back appears normal EXT: Normal ROM in all joints; no deformity noted, no edema SKIN: Normal color for age and race; warm; no rash on exposed skin NEURO: Moves all extremities equally, normal speech PSYCH: The patient's mood and manner are appropriate.   ED Results / Procedures / Treatments   LABS: (all labs ordered are listed, but only abnormal results are displayed) Labs Reviewed  RESP PANEL BY RT-PCR (RSV, FLU A&B, COVID)  RVPGX2 - Abnormal; Notable for the following components:      Result Value   Resp Syncytial Virus by PCR POSITIVE (*)    All other components within normal limits  BASIC METABOLIC PANEL - Abnormal; Notable  for the following components:   Glucose, Bld 120 (*)    All other components within normal limits  CBC - Abnormal; Notable for the following components:   WBC 11.8 (*)    All other components within normal limits  GROUP A STREP BY PCR  PROCALCITONIN     EKG:  EKG Interpretation  Date/Time:  Sunday March 15 2023 21:05:19 EDT Ventricular Rate:  104 PR Interval:  132 QRS Duration: 72 QT Interval:  336 QTC Calculation: 441 R Axis:   -48 Text Interpretation: Sinus tachycardia Left axis deviation Abnormal ECG When compared with ECG of 02-Mar-2019 11:14, Vent. rate has increased BY  37 BPM QRS axis Shifted left Confirmed by Rochele Raring [phone]) on 03/15/2023 11:36:05 PM         RADIOLOGY: My personal review and interpretation of imaging: Chest x-ray shows possible atelectasis versus viral pneumonia.  I have personally reviewed all radiology reports.   DG Chest 2 View  Result Date: 03/15/2023 CLINICAL DATA:  Cough. EXAM: CHEST - 2 VIEW COMPARISON:  January 10, 2021 FINDINGS: The heart size and mediastinal contours are within normal limits. Very mild right infrahilar atelectasis and/or early infiltrate is seen. There is no evidence of a pleural effusion or pneumothorax. The visualized skeletal structures are unremarkable. IMPRESSION:  Very mild right infrahilar atelectasis and/or early infiltrate. Electronically Signed   By: Aram Candela M.D.   On: 03/15/2023 21:44     PROCEDURES:  Critical Care performed: No      .1-3 Lead EKG Interpretation  Performed by: Ramey Ketcherside, Layla Maw, DO Authorized by: Yomaira Solar, Layla Maw, DO     Interpretation: normal     ECG rate:  87   ECG rate assessment: normal     Rhythm: sinus rhythm     Ectopy: none     Conduction: normal       IMPRESSION / MDM / ASSESSMENT AND PLAN / ED COURSE  I reviewed the triage vital signs and the nursing notes.    Patient here with fevers, cough.  The patient is on the cardiac monitor to evaluate for evidence of arrhythmia and/or significant heart rate changes.   DIFFERENTIAL DIAGNOSIS (includes but not limited to):   Pneumonia, viral URI, doubt ACS, PE, pneumothorax, CHF   Patient's presentation is most consistent with acute presentation with potential threat to life or bodily function.   PLAN: Patient's workup initiated from triage.  Slight leukocytosis.  Febrile, tachycardic and tachypneic upon arrival but this is improved as her fever has defervesced.  She is positive for RSV.  COVID, flu and strep negative.  Chest x-ray reviewed and interpreted by myself and the radiologist shows atelectasis versus early infiltrate in the right infrahilar area.  Will add on procalcitonin but suspect that this is viral in nature.  She has no respiratory distress or increased work of breathing currently and no hypoxia at rest or with ambulation.  She does have a frequent nonproductive cough.  Will give cough medications here.  Will continue to monitor to see if patient would be appropriate for discharge home.   MEDICATIONS GIVEN IN ED: Medications  albuterol (VENTOLIN HFA) 108 (90 Base) MCG/ACT inhaler 2 puff (has no administration in time range)  acetaminophen (TYLENOL) tablet 650 mg (650 mg Oral Given 03/15/23 2104)  guaiFENesin-codeine 100-10 MG/5ML  solution 5 mL (5 mLs Oral Given 03/15/23 2359)     ED COURSE: Procalcitonin is negative.  Low suspicion that this was a bacterial pneumonia and more likely due  to RSV.  Discussed with patient that unfortunately there is no treatment for RSV other than supportive care.  She does not need antibiotics today.  Have offered admission for monitoring given she does meet sepsis criteria but has normal lactate, normal blood pressures.  Patient states she would prefer to go home.  She is not hypoxic here with rest or ambulation and is feeling better after guaifenesin with codeine.  Will provide short course of the same for symptomatic relief at home.  Discussed strict return precautions with patient and granddaughter.   CONSULTS: Admission offered but patient declined.   OUTSIDE RECORDS REVIEWED: Reviewed last rheumatology note on 02/03/2022.       FINAL CLINICAL IMPRESSION(S) / ED DIAGNOSES   Final diagnoses:  RSV (respiratory syncytial virus pneumonia)     Rx / DC Orders   ED Discharge Orders          Ordered    guaiFENesin-codeine 100-10 MG/5ML syrup  Every 8 hours PRN        03/16/23 0220             Note:  This document was prepared using Dragon voice recognition software and may include unintentional dictation errors.   Kimiya Brunelle, Layla Maw, DO 03/16/23 [phone]

## 2023-03-15 NOTE — ED Triage Notes (Signed)
Patient reports feeling sick with cough and headache. Today she presents with a headache in triage. Patient states she has been taking cough medicine and theraflu. Hx of asthma.

## 2023-03-15 NOTE — ED Notes (Signed)
Patient ambulated to obtain pulse oximetry during exertion.  Patient oxygen saturation numbers maintained a level of 93-94% during exertion.  After completion, and getting back on the bed saturation reached 91% momentarily, but went almost immediately back to 94% on room air.  Dr. Leonides Schanz has been notified.

## 2023-03-16 LAB — PROCALCITONIN: Procalcitonin: <0.1 ng/mL

## 2023-03-16 MED ORDER — GUAIFENESIN-CODEINE 100-10 MG/5ML PO SOLN: 5.0000 mL | Freq: Three times a day (TID) | ORAL | 0 refills | Status: DC | PRN

## 2023-03-20 NOTE — Progress Notes (Unsigned)
Name: Molly [name]   MRN: [id number]    DOB: 1951-09-11   Date:03/23/2023       Progress Note  Subjective  Chief Complaint  Follow up   HPI  Cough: patient has a personal history of asthma and developed increase of cough about one month ago, she went to [facility] on 03/17 due to increase in fatigue, fever, lack of appetite and increase in cough. She had a positive RSV, her pulse ox was around 96 % and was sent home on cough suppressant medication. She came in today with worsening of symptoms. She has orthopnea, lack of appetite, increase in fatigue, hoarseness, cough is mostly dry but occasionally wet and SOB even at rest. Pulse ox while sitting was 93%.  Discussed importance of stat labs and CXR to make sure not septic. Depending on results she is either going to come back today for treatment or send her for direct admission. Daughter and also a friend that came in with her are in agreement.    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 ([place]) 01/11/2019   Iron deficiency anemia 11/15/2018   Endometrial adenocarcinoma ([place]) [id number]   Umbilical hernia without obstruction and without gangrene    Atherosclerosis of right coronary artery 11/10/2017   Atherosclerosis of abdominal aorta ([place]) 11/10/2017   Prediabetes 10/02/2017   Perennial allergic rhinitis with seasonal variation 05/14/2016   Asthma, mild intermittent, well-controlled 05/14/2016   Hypertension goal BP (blood pressure) < 140/90 12/10/2015   Cardiomyopathy due to hypertension ([place]) 12/10/2015   History of CVA (cerebrovascular accident) 12/10/2015   Hyperlipidemia LDL goal <70 12/10/2015   Statin intolerance 12/10/2015     Past Surgical History:  Procedure Laterality Date   ABDOMINAL HYSTERECTOMY     CHOLECYSTECTOMY     COLONOSCOPY WITH PROPOFOL N/A 01/08/2017   Procedure: COLONOSCOPY WITH PROPOFOL;  Surgeon: [name], MD;  Location: [facility] ENDOSCOPY;  Service: Endoscopy;  Laterality: N/A;   COLONOSCOPY WITH PROPOFOL N/A 05/05/2018   Procedure: COLONOSCOPY WITH PROPOFOL;  Surgeon: [name], MD;  Location[facility] ENDOSCOPY;  Service: Gastroenterology;  Laterality: N/A;   COLONOSCOPY WITH PROPOFOL N/A 07/15/2021   Procedure: COLONOSCOPY WITH PROPOFOL;  Surgeon: [name], MD;  Location: [facility] ENDOSCOPY;  Service: Gastroenterology;  Laterality: N/A;   HERNIA REPAIR  [id number]   umbilical   PORTACATH PLACEMENT N/A 11/04/2018   Procedure: INSERTION PORT-A-CATH;  Surgeon: Jules Husbands, MD;  Location: [facility] ORS;  Service: General;  Laterality: N/A;   SENTINEL NODE BIOPSY N/A 10/06/2018   Procedure: SENTINEL NODE BIOPSY;  Surgeon: Mellody Drown, MD;  Location: [facility] ORS;  Service: Gynecology;  Laterality: N/A;   UMBILICAL HERNIA REPAIR N/A 11/17/2017   Procedure: HERNIA REPAIR UMBILICAL ADULT;  Surgeon: Jules Husbands, MD;  Location: [facility] ORS;  Service: General;  Laterality: N/A;    Family History  Problem Relation Age of Onset   Stroke Mother    Hypertension Mother    Dementia Mother    Alzheimer's disease Mother    Gout Father    Asthma Father    Hypertension Father    Dementia Father    Healthy Sister    Stroke Brother    Alzheimer's disease Brother  Healthy Daughter    Hypertension Brother    Healthy Brother    Cancer Paternal Grandmother    Healthy Sister    Healthy Sister    Hypertension Sister    Hypertension Sister    Stroke Brother    Alzheimer's disease Brother    Stroke Brother    Hypertension Brother    Stroke Brother    Hypertension Brother    Healthy Brother    Healthy Brother    Hypertension Daughter    Breast cancer Neg Hx     Social History   Tobacco Use   Smoking  status: Never   Smokeless tobacco: Never   Tobacco comments:    smoking cessation materials not required  Substance Use Topics   Alcohol use: No    Alcohol/week: 0.0 standard drinks of alcohol     Current Outpatient Medications:    acetaminophen (TYLENOL) 500 MG tablet, Take 2 tablets (1,000 mg total) by mouth every 6 (six) hours as needed for mild pain or moderate pain., Disp: 30 tablet, Rfl: 0   albuterol (VENTOLIN HFA) 108 (90 Base) MCG/ACT inhaler, Inhale 2 puffs into the lungs every 4 (four) hours as needed for wheezing or shortness of breath., Disp: 8.5 each, Rfl: 11   Calcium Carb-Cholecalciferol (CALCIUM 1000 + D PO), Take by mouth., Disp: , Rfl:    cetirizine (ZYRTEC ALLERGY) 10 MG tablet, Take 1 tablet (10 mg total) by mouth daily., Disp: 30 tablet, Rfl: 1   clopidogrel (PLAVIX) 75 MG tablet, Take 75 mg by mouth daily., Disp: , Rfl:    diclofenac Sodium (VOLTAREN) 1 % GEL, Apply 4 grams to the skin 4 times daily. Need appt., Disp: 300 g, Rfl: 0   escitalopram (LEXAPRO) 5 MG tablet, Take 1 tablet (5 mg total) by mouth daily., Disp: 90 tablet, Rfl: 1   Evolocumab (REPATHA SURECLICK) [id number] MG/ML SOAJ, inject 140mg  into THE SKIN every 14 DAYS, Disp: 2 mL, Rfl: 1   fluticasone furoate-vilanterol (BREO ELLIPTA) 100-25 MCG/ACT AEPB, Inhale 1 puff into the lungs daily., Disp: 1 each, Rfl: 11   furosemide (LASIX) 40 MG tablet, Take 1 tablet (40 mg total) by mouth daily as needed., Disp: 90 tablet, Rfl: 0   guaiFENesin-codeine 100-10 MG/5ML syrup, Take 5 mLs by mouth every 8 (eight) hours as needed for cough., Disp: 75 mL, Rfl: 0   ketoconazole (NIZORAL) 2 % cream, APPLY TO THE AFFECTED AREA(S) of abdominal fold ONCE daily AS NEEDED, Disp: 60 g, Rfl: 0   ofloxacin (OCUFLOX) 0.3 % ophthalmic solution, Place into the left eye., Disp: , Rfl:    olmesartan-hydrochlorothiazide (BENICAR HCT) 20-12.5 MG tablet, TAKE ONE TABLET BY MOUTH EVERY MORNING, Disp: 30 tablet, Rfl: 1   pantoprazole (PROTONIX)  20 MG tablet, Take 20 mg by mouth daily., Disp: , Rfl:    potassium chloride SA (KLOR-CON M) 20 MEQ tablet, Take 1 tablet (20 mEq total) by mouth 2 (two) times daily., Disp: 180 tablet, Rfl: 1   prednisoLONE acetate (PRED FORTE) 1 % ophthalmic suspension, Place into the left eye., Disp: , Rfl:    pregabalin (LYRICA) 100 MG capsule, Take 1 capsule (100 mg total) by mouth 2 (two) times daily., Disp: 60 capsule, Rfl: 5  Allergies  Allergen Reactions   Ferumoxytol Anaphylaxis   Liraglutide Other (See Comments)    pancreatitis   Nsaids Hives, Rash and Nausea And Vomiting   Clopidogrel Other (See Comments)    Reports intolerance and states causes myalgias   Statins Other (  See Comments) and Nausea And Vomiting    Joint pains   Oxycodone Nausea And Vomiting   Crestor [Rosuvastatin Calcium] Rash   Zetia [Ezetimibe] Other (See Comments)    Muscle weakness and joint pain    I personally reviewed active problem list, medication list, allergies with the patient/caregiver today.   ROS  Ten systems reviewed and is negative except as mentioned in HPI   Objective  Vitals:   03/23/23 1058  BP: 136/82  Pulse: 74  Resp: 20  Temp: 98 F (36.7 C)  TempSrc: Oral  SpO2: 93%  Weight: 224 lb 6.4 oz (101.8 kg)  Height: 4\' 11"  (1.499 m)    Body mass index is 45.32 kg/m.  Physical Exam  Constitutional: Patient appears well-developed and well-nourished. Obese  No distress.  HEENT: head atraumatic, normocephalic, pupils equal and reactive to light, ears normal TM, neck supple, throat within normal limits Cardiovascular: Normal rate, regular rhythm and normal heart sounds.  No murmur heard. No BLE edema. Pulmonary/Chest: Effort normal and breath sounds normal. No respiratory distress. Abdominal: Soft.  There is no tenderness. Psychiatric: Patient has a normal mood and affect. behavior is normal. Judgment and thought content normal.   Recent Results (from the past 2160 hour(s))  CBC with  Differential/Platelet     Status: None   Collection Time: 02/16/23  1:56 PM  Result Value Ref Range   WBC 4.6 4.0 - 10.5 K/uL   RBC 4.05 3.87 - 5.11 MIL/uL   Hemoglobin 12.1 12.0 - 15.0 g/dL   HCT 37.8 36.0 - 46.0 %   MCV 93.3 80.0 - 100.0 fL   MCH 29.9 26.0 - 34.0 pg   MCHC 32.0 30.0 - 36.0 g/dL   RDW 12.8 11.5 - 15.5 %   Platelets 276 150 - 400 K/uL   nRBC 0.0 0.0 - 0.2 %   Neutrophils Relative % 50 %   Neutro Abs 2.3 1.7 - 7.7 K/uL   Lymphocytes Relative 41 %   Lymphs Abs 1.9 0.7 - 4.0 K/uL   Monocytes Relative 8 %   Monocytes Absolute 0.4 0.1 - 1.0 K/uL   Eosinophils Relative 1 %   Eosinophils Absolute 0.1 0.0 - 0.5 K/uL   Basophils Relative 0 %   Basophils Absolute 0.0 0.0 - 0.1 K/uL   Immature Granulocytes 0 %   Abs Immature Granulocytes 0.01 0.00 - 0.07 K/uL    Comment: Performed at [facility], [address] [place], [place] [address]  CA 125     Status: None   Collection Time: 02/16/23  1:56 PM  Result Value Ref Range   Cancer Antigen (CA) 125 10.4 0.0 - 38.1 U/mL    Comment: (NOTE) Roche Diagnostics Electrochemiluminescence Immunoassay (ECLIA) Values obtained with different assay methods or kits cannot be used interchangeably.  Results cannot be interpreted as absolute evidence of the presence or absence of malignant disease. Performed At: [facility] [place], [place] [id number] Rush Farmer MD [id number]   Comprehensive metabolic panel     Status: None   Collection Time: 02/16/23  1:56 PM  Result Value Ref Range   Sodium 138 135 - 145 mmol/L   Potassium 3.7 3.5 - 5.1 mmol/L   Chloride 103 98 - 111 mmol/L   CO2 27 22 - 32 mmol/L   Glucose, Bld 98 70 - 99 mg/dL    Comment: Glucose reference range applies only to samples taken after fasting for at least 8 hours.   BUN 14  8 - 23 mg/dL   Creatinine, Ser 0.77 0.44 - 1.00 mg/dL   Calcium 9.0 8.9 - 10.3 mg/dL   Total Protein 7.4 6.5 - 8.1 g/dL   Albumin 4.0 3.5 - 5.0 g/dL    AST 20 15 - 41 U/L   ALT 13 0 - 44 U/L   Alkaline Phosphatase 73 38 - 126 U/L   Total Bilirubin 0.7 0.3 - 1.2 mg/dL   GFR, Estimated >60 >60 mL/min    Comment: (NOTE) Calculated using the CKD-EPI Creatinine Equation (2021)    Anion gap 8 5 - 15    Comment: Performed at [facility], [address] [place], [place] [address]  Group A Strep by PCR     Status: None   Collection Time: 03/15/23  9:04 PM   Specimen: Throat; Sterile Swab  Result Value Ref Range   Group A Strep by PCR NOT DETECTED NOT DETECTED    Comment: Performed at [facility], [address] [place], [place] [address]  Resp panel by RT-PCR (RSV, Flu A&B, Covid) Throat     Status: Abnormal   Collection Time: 03/15/23  9:04 PM   Specimen: Throat; Nasal Swab  Result Value Ref Range   SARS Coronavirus 2 by RT PCR NEGATIVE NEGATIVE    Comment: (NOTE) SARS-CoV-2 target nucleic acids are NOT DETECTED.  The SARS-CoV-2 RNA is generally detectable in upper respiratory specimens during the acute phase of infection. The lowest concentration of SARS-CoV-2 viral copies this assay can detect is 138 copies/mL. A negative result does not preclude SARS-Cov-2 infection and should not be used as the sole basis for treatment or other patient management decisions. A negative result may occur with  improper specimen collection/handling, submission of specimen other than nasopharyngeal swab, presence of viral mutation(s) within the areas targeted by this assay, and inadequate number of viral copies(<138 copies/mL). A negative result must be combined with clinical observations, patient history, and epidemiological information. The expected result is Negative.  Fact Sheet for Patients:  EntrepreneurPulse.com.au  Fact Sheet for Healthcare Providers:  IncredibleEmployment.be  This test is no t yet approved or cleared by the [place] FDA and  has been authorized for detection  and/or diagnosis of SARS-CoV-2 by FDA under an Emergency Use Authorization (EUA). This EUA will remain  in effect (meaning this test can be used) for the duration of the COVID-19 declaration under Section 564(b)(1) of the Act, 21 U.S.C.section 360bbb-3(b)(1), unless the authorization is terminated  or revoked sooner.       Influenza A by PCR NEGATIVE NEGATIVE   Influenza B by PCR NEGATIVE NEGATIVE    Comment: (NOTE) The Xpert Xpress SARS-CoV-2/FLU/RSV plus assay is intended as an aid in the diagnosis of influenza from Nasopharyngeal swab specimens and should not be used as a sole basis for treatment. Nasal washings and aspirates are unacceptable for Xpert Xpress SARS-CoV-2/FLU/RSV testing.  Fact Sheet for Patients: EntrepreneurPulse.com.au  Fact Sheet for Healthcare Providers: IncredibleEmployment.be  This test is not yet approved or cleared by the [place] FDA and has been authorized for detection and/or diagnosis of SARS-CoV-2 by FDA under an Emergency Use Authorization (EUA). This EUA will remain in effect (meaning this test can be used) for the duration of the COVID-19 declaration under Section 564(b)(1) of the Act, 21 U.S.C. section 360bbb-3(b)(1), unless the authorization is terminated or revoked.     Resp Syncytial Virus by PCR POSITIVE (A) NEGATIVE    Comment: (NOTE) Fact Sheet for Patients: EntrepreneurPulse.com.au  Fact Sheet for Healthcare Providers: IncredibleEmployment.be  This test is not yet approved or cleared by the [place] FDA and has been authorized for detection and/or diagnosis of SARS-CoV-2 by FDA under an Emergency Use Authorization (EUA). This EUA will remain in effect (meaning this test can be used) for the duration of the COVID-19 declaration under Section 564(b)(1) of the Act, 21 U.S.C. section 360bbb-3(b)(1), unless the authorization is terminated  or revoked.  Performed at [facility], [address] [place], [place] [id number]   Basic metabolic panel     Status: Abnormal   Collection Time: 03/15/23  9:04 PM  Result Value Ref Range   Sodium 135 135 - 145 mmol/L   Potassium 3.8 3.5 - 5.1 mmol/L   Chloride 99 98 - 111 mmol/L   CO2 27 22 - 32 mmol/L   Glucose, Bld 120 (H) 70 - 99 mg/dL    Comment: Glucose reference range applies only to samples taken after fasting for at least 8 hours.   BUN 15 8 - 23 mg/dL   Creatinine, Ser 0.87 0.44 - 1.00 mg/dL   Calcium 9.3 8.9 - 10.3 mg/dL   GFR, Estimated >60 >60 mL/min    Comment: (NOTE) Calculated using the CKD-EPI Creatinine Equation (2021)    Anion gap 9 5 - 15    Comment: Performed at [facility], [address] [place], [place] [address]  CBC     Status: Abnormal   Collection Time: 03/15/23  9:04 PM  Result Value Ref Range   WBC 11.8 (H) 4.0 - 10.5 K/uL   RBC 4.37 3.87 - 5.11 MIL/uL   Hemoglobin 13.1 12.0 - 15.0 g/dL   HCT 40.6 36.0 - 46.0 %   MCV 92.9 80.0 - 100.0 fL   MCH 30.0 26.0 - 34.0 pg   MCHC 32.3 30.0 - 36.0 g/dL   RDW 13.3 11.5 - 15.5 %   Platelets 350 150 - 400 K/uL   nRBC 0.0 0.0 - 0.2 %    Comment: Performed at [facility], [address] [place], [place] [address]  Procalcitonin     Status: None   Collection Time: 03/15/23  9:04 PM  Result Value Ref Range   Procalcitonin <0.10 ng/mL    Comment:        Interpretation: PCT (Procalcitonin) <= 0.5 ng/mL: Systemic infection (sepsis) is not likely. Local bacterial infection is possible. (NOTE)       Sepsis PCT Algorithm           Lower Respiratory Tract                                      Infection PCT Algorithm    ----------------------------     ----------------------------         PCT < 0.25 ng/mL                PCT < 0.10 ng/mL          Strongly encourage             Strongly discourage   discontinuation of antibiotics    initiation of antibiotics     ----------------------------     -----------------------------       PCT 0.25 - 0.50 ng/mL            PCT 0.10 - 0.25 ng/mL  OR       >80% decrease in PCT            Discourage initiation of                                            antibiotics      Encourage discontinuation           of antibiotics    ----------------------------     -----------------------------         PCT >= 0.50 ng/mL              PCT 0.26 - 0.50 ng/mL               AND        <80% decrease in PCT             Encourage initiation of                                             antibiotics       Encourage continuation           of antibiotics    ----------------------------     -----------------------------        PCT >= 0.50 ng/mL                  PCT > 0.50 ng/mL               AND         increase in PCT                  Strongly encourage                                      initiation of antibiotics    Strongly encourage escalation           of antibiotics                                     -----------------------------                                           PCT <= 0.25 ng/mL                                                 OR                                        > 80% decrease in PCT                                      Discontinue / Do not initiate  antibiotics  Performed at [facility], [address] [place], [place] [address]      PHQ2/9:    03/23/2023   11:00 AM 11/12/2022    4:03 PM 05/22/2022    3:02 PM 04/23/2022   11:21 AM 04/16/2022    1:19 PM  Depression screen PHQ 2/9  Decreased Interest 0 0 0 0 0  Down, Depressed, Hopeless 0 0 0 0 0  PHQ - 2 Score 0 0 0 0 0  Altered sleeping 1 0   0  Tired, decreased energy 3 0   0  Change in appetite 1 0   0  Feeling bad or failure about yourself  0 0   0  Trouble concentrating 0 0   0  Moving slowly or fidgety/restless 0 0   0  Suicidal thoughts 0 0   0  PHQ-9 Score 5 0    0  Difficult doing work/chores Somewhat difficult        phq 9 is negative   Fall Risk:    03/23/2023   11:00 AM 11/12/2022    4:03 PM 05/22/2022    3:04 PM 04/23/2022   11:21 AM 04/16/2022    1:18 PM  Fall Risk   Falls in the past year? 1 0 0 0 0  Number falls in past yr: 0  0  0  Injury with Fall? 0  0  0  Risk for fall due to : History of fall(s);Impaired balance/gait No Fall Risks No Fall Risks  No Fall Risks  Follow up Falls prevention discussed;Education provided;Falls evaluation completed Falls prevention discussed;Education provided;Falls evaluation completed Falls prevention discussed Falls prevention discussed Falls prevention discussed   Assessment & Plan   1. Cough in adult  - CBC with Differential/Platelet; Future - Procalcitonin - DG Chest 2 View; Future - Basic metabolic panel; Future  Reviewed CBC and BMP - unremarkable, advised patient to come back for Rocephin, get rx below from pharmacy and return in 48 hours for follow up - cefTRIAXone (ROCEPHIN) injection 1 g - azithromycin (ZITHROMAX) 250 MG tablet; Take 2 tablets on day 1, then 1 tablet daily on days 2 through 5  Dispense: 6 tablet; Refill: 0 - methylPREDNISolone (MEDROL DOSEPAK) 4 MG TBPK tablet; Take as directed  Dispense: 21 tablet; Refill: 0   2. SOB (shortness of breath)  - CBC with Differential/Platelet; Future - Procalcitonin - DG Chest 2 View; Future - Basic metabolic panel; Future  3. Other fatigue  - CBC with Differential/Platelet; Future - Procalcitonin - DG Chest 2 View; Future - Basic metabolic panel; Future

## 2023-03-23 ENCOUNTER — Ambulatory Visit (INDEPENDENT_AMBULATORY_CARE_PROVIDER_SITE_OTHER): Payer: 59 | Admitting: Family Medicine

## 2023-03-23 ENCOUNTER — Encounter: Payer: Self-pay | Admitting: Family Medicine

## 2023-03-23 ENCOUNTER — Other Ambulatory Visit
Admission: RE | Admit: 2023-03-23 | Discharge: 2023-03-23 | Disposition: A | Discharge status: Home / Self Care | Payer: 59 | Source: Ambulatory Visit | Attending: Family Medicine | Admitting: Family Medicine

## 2023-03-23 VITALS — BP 136/82 | HR 74 | Temp 98.0°F | Resp 20 | Ht 59.0 in | Wt 224.4 lb

## 2023-03-23 DIAGNOSIS — R0602 Shortness of breath: Secondary | ICD-10-CM | POA: Insufficient documentation

## 2023-03-23 DIAGNOSIS — R059 Cough, unspecified: Secondary | ICD-10-CM | POA: Diagnosis present

## 2023-03-23 DIAGNOSIS — Z1231 Encounter for screening mammogram for malignant neoplasm of breast: Secondary | ICD-10-CM

## 2023-03-23 DIAGNOSIS — R5383 Other fatigue: Secondary | ICD-10-CM

## 2023-03-23 LAB — CBC WITH DIFFERENTIAL/PLATELET
Abs Immature Granulocytes: 0.05 10*3/uL (ref 0.00–0.07)
Basophils Absolute: 0 10*3/uL (ref 0.0–0.1)
Basophils Relative: 0 %
Eosinophils Absolute: 0.1 10*3/uL (ref 0.0–0.5)
Eosinophils Relative: 1 %
HCT: 36.5 % (ref 36.0–46.0)
Hemoglobin: 11.7 g/dL — ABNORMAL LOW (ref 12.0–15.0)
Immature Granulocytes: 1 %
Lymphocytes Relative: 25 %
Lymphs Abs: 2 10*3/uL (ref 0.7–4.0)
MCH: 30.2 pg (ref 26.0–34.0)
MCHC: 32.1 g/dL (ref 30.0–36.0)
MCV: 94.1 fL (ref 80.0–100.0)
Monocytes Absolute: 0.7 10*3/uL (ref 0.1–1.0)
Monocytes Relative: 9 %
Neutro Abs: 5 10*3/uL (ref 1.7–7.7)
Neutrophils Relative %: 64 %
Platelets: 374 10*3/uL (ref 150–400)
RBC: 3.88 MIL/uL (ref 3.87–5.11)
RDW: 12.9 % (ref 11.5–15.5)
WBC: 7.9 10*3/uL (ref 4.0–10.5)
nRBC: 0 % (ref 0.0–0.2)

## 2023-03-23 LAB — BASIC METABOLIC PANEL
Anion gap: 11 (ref 5–15)
BUN: 8 mg/dL (ref 8–23)
CO2: 27 mmol/L (ref 22–32)
Calcium: 9.6 mg/dL (ref 8.9–10.3)
Chloride: 101 mmol/L (ref 98–111)
Creatinine, Ser: 0.85 mg/dL (ref 0.44–1.00)
GFR, Estimated: >60 mL/min (ref >60)
Glucose, Bld: 113 mg/dL — ABNORMAL HIGH (ref 70–99)
Potassium: 3.9 mmol/L (ref 3.5–5.1)
Sodium: 139 mmol/L (ref 135–145)

## 2023-03-23 MED ORDER — CEFTRIAXONE SODIUM 500 MG IJ SOLR
1000.0000 mg | Freq: Once | INTRAMUSCULAR | Status: AC
  Administered 2023-03-23: 1000 mg via INTRAMUSCULAR

## 2023-03-23 MED ORDER — AZITHROMYCIN 250 MG PO TABS: ORAL_TABLET | ORAL | 0 refills | Status: AC

## 2023-03-23 MED ORDER — CEFTRIAXONE SODIUM 1 G IJ SOLR: 1.0000 g | Freq: Once | INTRAMUSCULAR | Status: DC

## 2023-03-23 MED ORDER — LIDOCAINE HCL (PF) 1 % IJ SOLN
2.0000 mL | Freq: Once | INTRAMUSCULAR | Status: AC
  Administered 2023-03-23: 2 mL via INTRADERMAL

## 2023-03-23 MED ORDER — METHYLPREDNISOLONE 4 MG PO TBPK: ORAL_TABLET | ORAL | 0 refills | Status: DC

## 2023-03-24 NOTE — Progress Notes (Signed)
   Acute Office Visit  Subjective:     Patient ID: Molly [name], female    DOB: 02-06-1951, 72 y.o.   MRN: [id number]  No chief complaint on file.   HPI Patient is in today for patient here for follow up after pneumonia treatment. She presented 03/23/23 complaining of cough for one month. Pulse ox 93% at that time. She was seen in urgent care 03/15/23 and diagnosed with RSV. Chest x-ray ordered, no results yet. Labs unremarkable with exception of slightly elevated glucose and hgb 11.7. Normal white count. Procalcitonin pending. Patient to return to clinic for Rocephin IM and was started on Azithromycin and Medrol dose pack.   ROS      Objective:    There were no vitals taken for this visit. {Vitals History (Optional):23777}  Physical Exam  No results found for any visits on 03/25/23.      Assessment & Plan:   Problem List Items Addressed This Visit   None   No orders of the defined types were placed in this encounter.   No follow-ups on file.  Teodora Medici, DO

## 2023-03-25 ENCOUNTER — Ambulatory Visit (INDEPENDENT_AMBULATORY_CARE_PROVIDER_SITE_OTHER): Payer: 59 | Admitting: Internal Medicine

## 2023-03-25 ENCOUNTER — Encounter: Payer: Self-pay | Admitting: Internal Medicine

## 2023-03-25 VITALS — BP 126/76 | HR 71 | Temp 98.1°F | Resp 18 | Ht 59.0 in

## 2023-03-25 DIAGNOSIS — R0602 Shortness of breath: Secondary | ICD-10-CM

## 2023-03-25 DIAGNOSIS — R051 Acute cough: Secondary | ICD-10-CM | POA: Diagnosis not present

## 2023-03-25 DIAGNOSIS — J210 Acute bronchiolitis due to respiratory syncytial virus: Secondary | ICD-10-CM

## 2023-03-25 DIAGNOSIS — R062 Wheezing: Secondary | ICD-10-CM

## 2023-03-25 MED ORDER — PREDNISONE 20 MG PO TABS: 20.0000 mg | ORAL_TABLET | Freq: Two times a day (BID) | ORAL | 0 refills | Status: AC

## 2023-03-25 NOTE — Patient Instructions (Signed)
It was great seeing you today!  Plan discussed at today's visit: -Continue Azithromycin until complete -I'm switching steroids to Prednisone which is a little stronger than what you are taking now.  -Use Trelegy inhaler daily, can use Albuterol rescue inhaler every 4-6 hours as needed  Follow up in: 1 week   Take care and let us know if you have any questions or concerns prior to your next visit.  Dr. Rosana Berger

## 2023-03-31 NOTE — Progress Notes (Unsigned)
Name: Molly [name]   MRN: [id number]    DOB: 05-16-1951   Date:04/01/2023       Progress Note  Subjective  Chief Complaint  Follow Up  HPI  She is here today for sub acute cough and fatigue follow up. She tested positive for RSV at urgent care on 03/17 with early pneumonia that day and had WBC of 11.8 she was given cough syrup but got worse and was seen in our office on 03/25, CBC showed improvement but since clinically much worse we gave her Rocephin and Zpack, she was seen again by Dr. Rosana Berger on 3/27 and added prednisone, she is finally feeling better. Cough improved, still productive but mostly in am and clear to white sputum, wheezing has improved. Still very fatigued , appetite is improving. 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 01/11/2019   Iron deficiency anemia 11/15/2018   Endometrial adenocarcinoma [id number]   Umbilical hernia without obstruction and without gangrene    Atherosclerosis of right coronary artery 11/10/2017   Atherosclerosis of abdominal aorta 11/10/2017   Prediabetes 10/02/2017   Perennial allergic rhinitis with seasonal variation 05/14/2016   Asthma, mild intermittent, well-controlled 05/14/2016   Hypertension goal BP (blood pressure) < 140/90 12/10/2015   Cardiomyopathy due to hypertension 12/10/2015   History of CVA (cerebrovascular accident) 12/10/2015   Hyperlipidemia LDL goal <70 12/10/2015   Statin intolerance 12/10/2015    Past Surgical History:  Procedure Laterality Date   ABDOMINAL HYSTERECTOMY     CHOLECYSTECTOMY     COLONOSCOPY WITH PROPOFOL N/A 01/08/2017   Procedure: COLONOSCOPY WITH PROPOFOL;  Surgeon: [name], MD;   Location: [facility] ENDOSCOPY;  Service: Endoscopy;  Laterality: N/A;   COLONOSCOPY WITH PROPOFOL N/A 05/05/2018   Procedure: COLONOSCOPY WITH PROPOFOL;  Surgeon: [name], MD;  Location: [facility] ENDOSCOPY;  Service: Gastroenterology;  Laterality: N/A;   COLONOSCOPY WITH PROPOFOL N/A 07/15/2021   Procedure: COLONOSCOPY WITH PROPOFOL;  Surgeon: [name], MD;  Location: [facility] ENDOSCOPY;  Service: Gastroenterology;  Laterality: N/A;   HERNIA REPAIR  [id number]   umbilical   PORTACATH PLACEMENT N/A 11/04/2018   Procedure: INSERTION PORT-A-CATH;  Surgeon: Jules Husbands, MD;  Location: [facility] ORS;  Service: General;  Laterality: N/A;   SENTINEL NODE BIOPSY N/A 10/06/2018   Procedure: SENTINEL NODE BIOPSY;  Surgeon: Mellody Drown, MD;  Location: [facility] ORS;  Service: Gynecology;  Laterality: N/A;   UMBILICAL HERNIA REPAIR N/A 11/17/2017   Procedure: HERNIA REPAIR UMBILICAL ADULT;  Surgeon: Jules Husbands, MD;  Location: [facility] ORS;  Service: General;  Laterality: N/A;    Family History  Problem Relation Age of Onset   Stroke Mother    Hypertension Mother    Dementia Mother    Alzheimer's disease Mother    Gout Father    Asthma Father    Hypertension Father    Dementia Father    Healthy Sister    Stroke Brother    Alzheimer's disease Brother    Healthy Daughter    Hypertension Brother    Healthy Brother    Cancer Paternal 13    Healthy Sister    Healthy Sister    Hypertension Sister    Hypertension Sister  Stroke Brother    Alzheimer's disease Brother    Stroke Brother    Hypertension Brother    Stroke Brother    Hypertension Brother    Healthy Brother    Healthy Brother    Hypertension Daughter    Breast cancer Neg Hx     Social History   Tobacco Use   Smoking status: Never   Smokeless tobacco: Never   Tobacco comments:    smoking cessation materials not required  Substance Use Topics   Alcohol use: No    Alcohol/week: 0.0 standard drinks of alcohol     Current  Outpatient Medications:    acetaminophen (TYLENOL) 500 MG tablet, Take 2 tablets (1,000 mg total) by mouth every 6 (six) hours as needed for mild pain or moderate pain., Disp: 30 tablet, Rfl: 0   albuterol (VENTOLIN HFA) 108 (90 Base) MCG/ACT inhaler, Inhale 2 puffs into the lungs every 4 (four) hours as needed for wheezing or shortness of breath., Disp: 8.5 each, Rfl: 11   Calcium Carb-Cholecalciferol (CALCIUM 1000 + D PO), Take by mouth., Disp: , Rfl:    cetirizine (ZYRTEC ALLERGY) 10 MG tablet, Take 1 tablet (10 mg total) by mouth daily., Disp: 30 tablet, Rfl: 1   clopidogrel (PLAVIX) 75 MG tablet, Take 75 mg by mouth daily., Disp: , Rfl:    diclofenac Sodium (VOLTAREN) 1 % GEL, Apply 4 grams to the skin 4 times daily. Need appt., Disp: 300 g, Rfl: 0   escitalopram (LEXAPRO) 5 MG tablet, Take 1 tablet (5 mg total) by mouth daily., Disp: 90 tablet, Rfl: 1   Evolocumab (REPATHA SURECLICK) [id number] MG/ML SOAJ, inject 140mg  into THE SKIN every 14 DAYS, Disp: 2 mL, Rfl: 1   fluticasone furoate-vilanterol (BREO ELLIPTA) 100-25 MCG/ACT AEPB, Inhale 1 puff into the lungs daily., Disp: 1 each, Rfl: 11   furosemide (LASIX) 40 MG tablet, Take 1 tablet (40 mg total) by mouth daily as needed., Disp: 90 tablet, Rfl: 0   guaiFENesin-codeine 100-10 MG/5ML syrup, Take 5 mLs by mouth every 8 (eight) hours as needed for cough., Disp: 75 mL, Rfl: 0   ketoconazole (NIZORAL) 2 % cream, APPLY TO THE AFFECTED AREA(S) of abdominal fold ONCE daily AS NEEDED, Disp: 60 g, Rfl: 0   ofloxacin (OCUFLOX) 0.3 % ophthalmic solution, Place into the left eye., Disp: , Rfl:    olmesartan-hydrochlorothiazide (BENICAR HCT) 20-12.5 MG tablet, TAKE ONE TABLET BY MOUTH EVERY MORNING, Disp: 30 tablet, Rfl: 1   pantoprazole (PROTONIX) 20 MG tablet, Take 20 mg by mouth daily., Disp: , Rfl:    potassium chloride SA (KLOR-CON M) 20 MEQ tablet, Take 1 tablet (20 mEq total) by mouth 2 (two) times daily., Disp: 180 tablet, Rfl: 1   prednisoLONE  acetate (PRED FORTE) 1 % ophthalmic suspension, Place into the left eye., Disp: , Rfl:    pregabalin (LYRICA) 100 MG capsule, Take 1 capsule (100 mg total) by mouth 2 (two) times daily., Disp: 60 capsule, Rfl: 5  Allergies  Allergen Reactions   Ferumoxytol Anaphylaxis   Liraglutide Other (See Comments)    pancreatitis   Nsaids Hives, Rash and Nausea And Vomiting   Clopidogrel Other (See Comments)    Reports intolerance and states causes myalgias   Statins Other (See Comments) and Nausea And Vomiting    Joint pains   Oxycodone Nausea And Vomiting   Crestor [Rosuvastatin Calcium] Rash   Zetia [Ezetimibe] Other (See Comments)    Muscle weakness and joint pain  I personally reviewed active problem list, medication list, allergies, family history, social history, health maintenance with the patient/caregiver today.   ROS  Ten systems reviewed and is negative except as mentioned in HPI   Objective  Vitals:   04/01/23 1112  BP: 124/72  Pulse: 65  Resp: 16  SpO2: 94%  Weight: 224 lb (101.6 kg)  Height: 4\' 11"  (1.499 m)    Body mass index is 45.24 kg/m.  Physical Exam  Constitutional: Patient appears well-developed and well-nourished. Obese  No distress.  HEENT: head atraumatic, normocephalic, pupils equal and reactive to light, neck supple Cardiovascular: Normal rate, regular rhythm and normal heart sounds.  No murmur heard. No BLE edema. Pulmonary/Chest: Effort normal and breath sounds normal. No respiratory distress. Abdominal: Soft.  There is no tenderness. Psychiatric: Patient has a normal mood and affect. behavior is normal. Judgment and thought content normal.    PHQ2/9:    04/01/2023   11:11 AM 03/25/2023   11:49 AM 03/23/2023   11:00 AM 11/12/2022    4:03 PM 05/22/2022    3:02 PM  Depression screen PHQ 2/9  Decreased Interest 0 0 0 0 0  Down, Depressed, Hopeless 0 0 0 0 0  PHQ - 2 Score 0 0 0 0 0  Altered sleeping 0 0 1 0   Tired, decreased energy 3 0 3 0    Change in appetite 0 0 1 0   Feeling bad or failure about yourself  0 0 0 0   Trouble concentrating 0 0 0 0   Moving slowly or fidgety/restless 0 0 0 0   Suicidal thoughts 0 0 0 0   PHQ-9 Score 3 0 5 0   Difficult doing work/chores  Not difficult at all Somewhat difficult      phq 9 is negative   Fall Risk:    04/01/2023   11:11 AM 03/25/2023   11:49 AM 03/23/2023   11:00 AM 11/12/2022    4:03 PM 05/22/2022    3:04 PM  Fall Risk   Falls in the past year? 1  1 0 0  Number falls in past yr: 1 1 0  0  Injury with Fall? 1 1 0  0  Risk for fall due to : Impaired balance/gait  History of fall(s);Impaired balance/gait No Fall Risks No Fall Risks  Follow up Falls prevention discussed  Falls prevention discussed;Education provided;Falls evaluation completed Falls prevention discussed;Education provided;Falls evaluation completed Falls prevention discussed      Functional Status Survey: Is the patient deaf or have difficulty hearing?: No Does the patient have difficulty seeing, even when wearing glasses/contacts?: No Does the patient have difficulty concentrating, remembering, or making decisions?: No Does the patient have difficulty walking or climbing stairs?: Yes Does the patient have difficulty dressing or bathing?: Yes Does the patient have difficulty doing errands alone such as visiting a doctor's office or shopping?: Yes    Assessment & Plan  1. Community acquired pneumonia of right lung, unspecified part of lung  Treated and feeling better, possibly from RSV but responded to antibiotic therapy and had elevated white count  2. Asthma, extrinsic, mild intermittent, with acute exacerbation

## 2023-04-01 ENCOUNTER — Ambulatory Visit (INDEPENDENT_AMBULATORY_CARE_PROVIDER_SITE_OTHER): Payer: 59 | Admitting: Family Medicine

## 2023-04-01 ENCOUNTER — Encounter: Payer: Self-pay | Admitting: Family Medicine

## 2023-04-01 VITALS — BP 124/72 | HR 65 | Resp 16 | Ht 59.0 in | Wt 224.0 lb

## 2023-04-01 DIAGNOSIS — J189 Pneumonia, unspecified organism: Secondary | ICD-10-CM | POA: Diagnosis not present

## 2023-04-01 DIAGNOSIS — J4521 Mild intermittent asthma with (acute) exacerbation: Secondary | ICD-10-CM | POA: Diagnosis not present

## 2023-04-02 ENCOUNTER — Other Ambulatory Visit: Payer: Self-pay | Admitting: Family Medicine

## 2023-04-02 DIAGNOSIS — Z8673 Personal history of transient ischemic attack (TIA), and cerebral infarction without residual deficits: Secondary | ICD-10-CM

## 2023-04-02 DIAGNOSIS — I10 Essential (primary) hypertension: Secondary | ICD-10-CM

## 2023-04-08 ENCOUNTER — Other Ambulatory Visit: Payer: Self-pay

## 2023-04-08 ENCOUNTER — Telehealth: Payer: Self-pay | Admitting: Family Medicine

## 2023-04-08 DIAGNOSIS — I10 Essential (primary) hypertension: Secondary | ICD-10-CM

## 2023-04-08 NOTE — Telephone Encounter (Signed)
Sent Dr.Sowles med request refill

## 2023-04-08 NOTE — Telephone Encounter (Signed)
Medication Refill - Medication: olmesartan-hydrochlorothiazide (BENICAR HCT) 20-12.5 MG tablet [Pharmacy Med Name: olmesartan 20 mg-hydrochlorothiazide 12.5 mg tablet] and REPATHA SURECLICK 140 MG/ML SOAJ [Pharmacy Med Name: Repatha SureClick 140 mg/mL subcutaneous pen injector]   Has the patient contacted their pharmacy? YesDorathy Daft from pharmacy called to f/u on medication refill  Preferred Pharmacy (with phone number or street name):  [facility] - [place], [place] - [address] Phone: [phone]  Fax: [phone]   Has the patient been seen for an appointment in the last year OR does the patient have an upcoming appointment? No.  Agent: Please be advised that RX refills may take up to 3 business days. We ask that you follow-up with your pharmacy.

## 2023-04-10 NOTE — Progress Notes (Unsigned)
Name: Molly [name]   MRN: [id number]    DOB: 1951/05/15   Date:04/13/2023       Progress Note  Subjective  Chief Complaint  Follow Up  HPI  Asthma: mild persistent: she is on daily Breo prn  and states no longer having daily cough, wheezing or SOB. She was recently diagnosed with RSV followed by CAP , but finally feeling better   Major Depression: currently in remission , she states she does not think she needs to take Lexapro any longer,she is down to 5 mg and still doing well., she asked me if lexapro helps with neuropathy since it has been painful and we will switch to duloxetine for that reason    Cardiomyopathy without CHF: lower extremity edema but mild and stable, she denies orthopnea, PND, chest pain  or SOB . She is on ARB and stable.    Endometrial cancer: s/p hysterectomy, finished the chemo and radiation therapy with Dr. Rushie Chestnut , no longer has a port, she had a CT pelvis 10/2020 and negative for recurrence of cancer. She is up to date with visits with Dr. Smith Robert and Aggie Cosier, also sees GYN and last CA 125 was 11 . She has neuropathy from chemotherapy, she continues to have intermittent pain on her toes and fingers , described as tingling, numbness and pain we will adjust dose of lyrica and switch from lexapro to duloxetine to control her pain  . She is up to date with follow up with oncologist . She is seeing oncologist yearly and also sees gyn. No recent problems.   Morbid obesity: off victoza because of episode of pancreatitis,. She gained from 213 lbs to 236 lbs after treatment for endometrial cancer, weight was in the 240 lbs for months but it has now been stable between 226-228 lbs., today is down to 222 lbs but recently had CAP and appetite is not back to normal yet.   HTN: BP is at goal today, continue current regiment, denies chest pain, palpitation and has stable SOB with activity   Atherosclerosis aorta/history of TIA and CAD :  she is intolerant to statin therapy, she was  on zetia and LDL not at goal, now on Repatha and using it correctly, last LDL was down to 70, continue current regiment   GERD: symptoms are well controlled, she has been off medication for months , doing well with life style modification . Unchanged   Hyperglycemia: today A1C is 6.5 %, explained that she needs to change her diet and try to exercise more, if A1C remains elevated or glucose above 126 she will have the diagnosis of diabetes. 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 01/11/2019   Iron deficiency anemia 11/15/2018   Endometrial adenocarcinoma 10/29/2018   Umbilical hernia without obstruction and without gangrene    Atherosclerosis of right coronary artery 11/10/2017   Atherosclerosis of abdominal aorta 11/10/2017   Prediabetes 10/02/2017   Perennial allergic rhinitis with seasonal variation 05/14/2016   Asthma, mild intermittent, well-controlled 05/14/2016   Hypertension goal BP (blood pressure) < 140/90 12/10/2015   Cardiomyopathy due to hypertension 12/10/2015   History of CVA (cerebrovascular accident) 12/10/2015   Hyperlipidemia LDL goal <70 12/10/2015   Statin intolerance  12/10/2015    Past Surgical History:  Procedure Laterality Date   ABDOMINAL HYSTERECTOMY     CHOLECYSTECTOMY     COLONOSCOPY WITH PROPOFOL N/A 01/08/2017   Procedure: COLONOSCOPY WITH PROPOFOL;  Surgeon: Wyline Mood, MD;  Location: [facility] ENDOSCOPY;  Service: Endoscopy;  Laterality: N/A;   COLONOSCOPY WITH PROPOFOL N/A 05/05/2018   Procedure: COLONOSCOPY WITH PROPOFOL;  Surgeon: Wyline Mood, MD;  Location[facility] ENDOSCOPY;  Service: Gastroenterology;  Laterality: N/A;   COLONOSCOPY WITH PROPOFOL N/A  07/15/2021   Procedure: COLONOSCOPY WITH PROPOFOL;  Surgeon: Wyline Mood, MD;  Location: [facility] ENDOSCOPY;  Service: Gastroenterology;  Laterality: N/A;   HERNIA REPAIR  10/2017   umbilical   PORTACATH PLACEMENT N/A 11/04/2018   Procedure: INSERTION PORT-A-CATH;  Surgeon: Leafy Ro, MD;  Location: [facility] ORS;  Service: General;  Laterality: N/A;   SENTINEL NODE BIOPSY N/A 10/06/2018   Procedure: SENTINEL NODE BIOPSY;  Surgeon: Leida Lauth, MD;  Location: [facility] ORS;  Service: Gynecology;  Laterality: N/A;   UMBILICAL HERNIA REPAIR N/A 11/17/2017   Procedure: HERNIA REPAIR UMBILICAL ADULT;  Surgeon: Leafy Ro, MD;  Location: [facility] ORS;  Service: General;  Laterality: N/A;    Family History  Problem Relation Age of Onset   Stroke Mother    Hypertension Mother    Dementia Mother    Alzheimer's disease Mother    Gout Father    Asthma Father    Hypertension Father    Dementia Father    Healthy Sister    Stroke Brother    Alzheimer's disease Brother    Healthy Daughter    Hypertension Brother    Healthy Brother    Cancer Paternal Grandmother    Healthy Sister    Healthy Sister    Hypertension Sister    Hypertension Sister    Stroke Brother    Alzheimer's disease Brother    Stroke Brother    Hypertension Brother    Stroke Brother    Hypertension Brother    Healthy Brother    Healthy Brother    Hypertension Daughter    Breast cancer Neg Hx     Social History   Tobacco Use   Smoking status: Never   Smokeless tobacco: Never   Tobacco comments:    smoking cessation materials not required  Substance Use Topics   Alcohol use: No    Alcohol/week: 0.0 standard drinks of alcohol     Current Outpatient Medications:    acetaminophen (TYLENOL) 500 MG tablet, Take 2 tablets (1,000 mg total) by mouth every 6 (six) hours as needed for mild pain or moderate pain., Disp: 30 tablet, Rfl: 0   albuterol (VENTOLIN HFA) 108 (90 Base) MCG/ACT inhaler, Inhale 2 puffs into the lungs  every 4 (four) hours as needed for wheezing or shortness of breath., Disp: 8.5 each, Rfl: 11   Calcium Carb-Cholecalciferol (CALCIUM 1000 + D PO), Take by mouth., Disp: , Rfl:    cetirizine (ZYRTEC ALLERGY) 10 MG tablet, Take 1 tablet (10 mg total) by mouth daily., Disp: 30 tablet, Rfl: 1   clopidogrel (PLAVIX) 75 MG tablet, Take 75 mg by mouth daily., Disp: , Rfl:    diclofenac Sodium (VOLTAREN) 1 % GEL, Apply 4 grams to the skin 4 times daily. Need appt., Disp: 300 g, Rfl: 0   escitalopram (LEXAPRO) 5 MG tablet, Take 1 tablet (5 mg total) by mouth daily., Disp: 90 tablet, Rfl: 1   Evolocumab (REPATHA SURECLICK) 140 MG/ML SOAJ, inject 140mg  into THE SKIN every  14 DAYS, Disp: 2 mL, Rfl: 0   fluticasone furoate-vilanterol (BREO ELLIPTA) 100-25 MCG/ACT AEPB, Inhale 1 puff into the lungs daily., Disp: 1 each, Rfl: 11   furosemide (LASIX) 40 MG tablet, Take 1 tablet (40 mg total) by mouth daily as needed., Disp: 90 tablet, Rfl: 0   guaiFENesin-codeine 100-10 MG/5ML syrup, Take 5 mLs by mouth every 8 (eight) hours as needed for cough., Disp: 75 mL, Rfl: 0   ketoconazole (NIZORAL) 2 % cream, APPLY TO THE AFFECTED AREA(S) of abdominal fold ONCE daily AS NEEDED, Disp: 60 g, Rfl: 0   ofloxacin (OCUFLOX) 0.3 % ophthalmic solution, Place into the left eye., Disp: , Rfl:    olmesartan-hydrochlorothiazide (BENICAR HCT) 20-12.5 MG tablet, TAKE ONE TABLET BY MOUTH EVERY MORNING, Disp: 30 tablet, Rfl: 0   pantoprazole (PROTONIX) 20 MG tablet, Take 20 mg by mouth daily., Disp: , Rfl:    potassium chloride SA (KLOR-CON M) 20 MEQ tablet, Take 1 tablet (20 mEq total) by mouth 2 (two) times daily., Disp: 180 tablet, Rfl: 1   prednisoLONE acetate (PRED FORTE) 1 % ophthalmic suspension, Place into the left eye., Disp: , Rfl:    pregabalin (LYRICA) 100 MG capsule, Take 1 capsule (100 mg total) by mouth 2 (two) times daily., Disp: 60 capsule, Rfl: 5  Allergies  Allergen Reactions   Ferumoxytol Anaphylaxis   Liraglutide  Other (See Comments)    pancreatitis   Nsaids Hives, Rash and Nausea And Vomiting   Clopidogrel Other (See Comments)    Reports intolerance and states causes myalgias   Statins Other (See Comments) and Nausea And Vomiting    Joint pains   Oxycodone Nausea And Vomiting   Crestor [Rosuvastatin Calcium] Rash   Zetia [Ezetimibe] Other (See Comments)    Muscle weakness and joint pain    I personally reviewed active problem list, medication list, allergies, family history, social history, health maintenance with the patient/caregiver today.   ROS  Ten systems reviewed and is negative except as mentioned in HPI   Objective  Vitals:   04/13/23 1501  BP: 120/72  Pulse: 80  Resp: 16  SpO2: 98%  Weight: 222 lb (100.7 kg)  Height: 4\' 11"  (1.499 m)    Body mass index is 44.84 kg/m.  Physical Exam  Constitutional: Patient appears well-developed and well-nourished. Obese  No distress.  HEENT: head atraumatic, normocephalic, pupils equal and reactive to light, neck supple Cardiovascular: Normal rate, regular rhythm and normal heart sounds.  No murmur heard. No BLE edema. Pulmonary/Chest: Effort normal and breath sounds normal. No respiratory distress. Abdominal: Soft.  There is no tenderness. Psychiatric: Patient has a normal mood and affect. behavior is normal. Judgment and thought content normal.    PHQ2/9:    04/13/2023    3:00 PM 04/01/2023   11:11 AM 03/25/2023   11:49 AM 03/23/2023   11:00 AM 11/12/2022    4:03 PM  Depression screen PHQ 2/9  Decreased Interest 0 0 0 0 0  Down, Depressed, Hopeless 0 0 0 0 0  PHQ - 2 Score 0 0 0 0 0  Altered sleeping 3 0 0 1 0  Tired, decreased energy 0 3 0 3 0  Change in appetite 3 0 0 1 0  Feeling bad or failure about yourself  0 0 0 0 0  Trouble concentrating 0 0 0 0 0  Moving slowly or fidgety/restless 0 0 0 0 0  Suicidal thoughts 0 0 0 0 0  PHQ-9 Score 6 3  0 5 0  Difficult doing work/chores   Not difficult at all Somewhat  difficult     phq 9 is negative   Fall Risk:    04/13/2023    3:00 PM 04/01/2023   11:11 AM 03/25/2023   11:49 AM 03/23/2023   11:00 AM 11/12/2022    4:03 PM  Fall Risk   Falls in the past year? 0  Number falls in past yr: 0   Injury with Fall? 0   Risk for fall due to : Impaired mobility;Impaired balance/gait Impaired balance/gait  History of fall(s);Impaired balance/gait No Fall Risks  Follow up Falls prevention discussed Falls prevention discussed  Falls prevention discussed;Education provided;Falls evaluation completed Falls prevention discussed;Education provided;Falls evaluation completed      Functional Status Survey: Is the patient deaf or have difficulty hearing?: No Does the patient have difficulty seeing, even when wearing glasses/contacts?: No Does the patient have difficulty concentrating, remembering, or making decisions?: No Does the patient have difficulty walking or climbing stairs?: Yes Does the patient have difficulty dressing or bathing?: No Does the patient have difficulty doing errands alone such as visiting a doctor's office or shopping?: Yes    Assessment & Plan  1. Dyslipidemia  On repatha and last LDL at goal   2. Cardiomyopathy due to hypertension, without heart failure  - furosemide (LASIX) 40 MG tablet; Take 1 tablet (40 mg total) by mouth daily as needed.  Dispense: 90 tablet; Refill: 0  3. Hyperglycemia  - POCT HgB A1C  4. Atherosclerosis of abdominal aorta  On repatha   5. Asthma, mild intermittent, well-controlled  - fluticasone furoate-vilanterol (BREO ELLIPTA) 100-25 MCG/ACT AEPB; Inhale 1 puff into the lungs daily.  Dispense: 1 each; Refill: 5  6. Morbid obesity  Discussed with the patient the risk posed by an increased BMI. Discussed importance of portion control, calorie counting and at least 150 minutes of physical activity weekly. Avoid sweet beverages and drink more water. Eat at least 6 servings of fruit  and vegetables daily   BMI over 40  7. History of CVA (cerebrovascular accident)  On REpatha  8. Primary osteoarthritis of both knees  Stable, takes tylenol prn   9. Hypertension goal BP (blood pressure) < 140/90  - furosemide (LASIX) 40 MG tablet; Take 1 tablet (40 mg total) by mouth daily as needed.  Dispense: 90 tablet; Refill: 0  10. Breast cancer screening by mammogram  - MM 3D SCREENING MAMMOGRAM BILATERAL BREAST; Future  11. Major depression in remission  - DULoxetine (CYMBALTA) 30 MG capsule; Take 1-2 capsules (30-60 mg total) by mouth daily. First week take one capsule after that two daily, next rx will be 60 mg  Dispense: 60 capsule; Refill: 0  12. Peripheral neuropathy due to chemotherapy  - DULoxetine (CYMBALTA) 30 MG capsule; Take 1-2 capsules (30-60 mg total) by mouth daily. First week take one capsule after that two daily, next rx will be 60 mg  Dispense: 60 capsule; Refill: 0 - pregabalin (LYRICA) 100 MG capsule; Take 1-2 capsules (100-200 mg total) by mouth 2 (two) times daily. One in am and 2 at night  Dispense: 90 capsule; Refill: 5  13. Bilateral lower extremity edema  - furosemide (LASIX) 40 MG tablet; Take 1 tablet (40 mg total) by mouth daily as needed.  Dispense: 90 tablet; Refill: 0  14. Primary hypertension  - olmesartan-hydrochlorothiazide (BENICAR HCT) 20-12.5 MG tablet; Take 1 tablet by mouth every  morning.  Dispense: 90 tablet; Refill: 1

## 2023-04-13 ENCOUNTER — Encounter: Payer: Self-pay | Admitting: Family Medicine

## 2023-04-13 ENCOUNTER — Ambulatory Visit (INDEPENDENT_AMBULATORY_CARE_PROVIDER_SITE_OTHER): Payer: 59 | Admitting: Family Medicine

## 2023-04-13 VITALS — BP 120/72 | HR 80 | Resp 16 | Ht 59.0 in | Wt 222.0 lb

## 2023-04-13 DIAGNOSIS — I43 Cardiomyopathy in diseases classified elsewhere: Secondary | ICD-10-CM

## 2023-04-13 DIAGNOSIS — R600 Localized edema: Secondary | ICD-10-CM

## 2023-04-13 DIAGNOSIS — Z8673 Personal history of transient ischemic attack (TIA), and cerebral infarction without residual deficits: Secondary | ICD-10-CM

## 2023-04-13 DIAGNOSIS — G620 Drug-induced polyneuropathy: Secondary | ICD-10-CM

## 2023-04-13 DIAGNOSIS — I10 Essential (primary) hypertension: Secondary | ICD-10-CM

## 2023-04-13 DIAGNOSIS — R739 Hyperglycemia, unspecified: Secondary | ICD-10-CM | POA: Diagnosis not present

## 2023-04-13 DIAGNOSIS — F325 Major depressive disorder, single episode, in full remission: Secondary | ICD-10-CM

## 2023-04-13 DIAGNOSIS — I119 Hypertensive heart disease without heart failure: Secondary | ICD-10-CM | POA: Diagnosis not present

## 2023-04-13 DIAGNOSIS — Z1231 Encounter for screening mammogram for malignant neoplasm of breast: Secondary | ICD-10-CM

## 2023-04-13 DIAGNOSIS — E785 Hyperlipidemia, unspecified: Secondary | ICD-10-CM | POA: Diagnosis not present

## 2023-04-13 DIAGNOSIS — I700 Atherosclerosis of aorta: Secondary | ICD-10-CM | POA: Diagnosis not present

## 2023-04-13 DIAGNOSIS — M170 Bilateral primary osteoarthritis of knee: Secondary | ICD-10-CM

## 2023-04-13 DIAGNOSIS — J4520 Mild intermittent asthma, uncomplicated: Secondary | ICD-10-CM

## 2023-04-13 DIAGNOSIS — T451X5A Adverse effect of antineoplastic and immunosuppressive drugs, initial encounter: Secondary | ICD-10-CM

## 2023-04-13 LAB — POCT GLYCOSYLATED HEMOGLOBIN (HGB A1C): Hemoglobin A1C: 6.5 % — AB (ref 4.0–5.6)

## 2023-04-13 MED ORDER — PREGABALIN 100 MG PO CAPS: 100.0000 mg | ORAL_CAPSULE | Freq: Two times a day (BID) | ORAL | 5 refills | Status: DC

## 2023-04-13 MED ORDER — DULOXETINE HCL 30 MG PO CPEP: 30.0000 mg | ORAL_CAPSULE | Freq: Every day | ORAL | 0 refills | Status: DC

## 2023-04-13 MED ORDER — OLMESARTAN MEDOXOMIL-HCTZ 20-12.5 MG PO TABS: 1.0000 | ORAL_TABLET | Freq: Every morning | ORAL | 1 refills | Status: DC

## 2023-04-13 MED ORDER — FUROSEMIDE 40 MG PO TABS: 40.0000 mg | ORAL_TABLET | Freq: Every day | ORAL | 0 refills | Status: DC | PRN

## 2023-04-13 MED ORDER — FLUTICASONE FUROATE-VILANTEROL 100-25 MCG/ACT IN AEPB: 1.0000 | INHALATION_SPRAY | Freq: Every day | RESPIRATORY_TRACT | 5 refills | Status: DC

## 2023-04-24 ENCOUNTER — Telehealth: Payer: 59

## 2023-04-29 ENCOUNTER — Telehealth: Payer: 59

## 2023-05-01 ENCOUNTER — Other Ambulatory Visit: Payer: Self-pay | Admitting: Family Medicine

## 2023-05-01 DIAGNOSIS — Z8673 Personal history of transient ischemic attack (TIA), and cerebral infarction without residual deficits: Secondary | ICD-10-CM

## 2023-05-06 ENCOUNTER — Telehealth: Payer: Self-pay | Admitting: Family Medicine

## 2023-05-06 NOTE — Telephone Encounter (Signed)
Medication Refill - Medication: DULoxetine (CYMBALTA) dosage changed from 30 MG to 60 MG capsule   Has the patient contacted their pharmacy? Yes.    (Agent: If yes, when and what did the pharmacy advise?)  Preferred Pharmacy (with phone number or street name):   [facility] - [place], [place] - [address] Phone: [phone]  Fax: [phone]     Has the patient been seen for an appointment in the last year OR does the patient have an upcoming appointment? Yes.    Agent: Please be advised that RX refills may take up to 3 business days. We ask that you follow-up with your pharmacy.

## 2023-05-08 ENCOUNTER — Telehealth: Payer: Self-pay

## 2023-05-08 NOTE — Telephone Encounter (Signed)
Received call from [facility] at [facility] regarding pt's Cymbalta rx.  Pharmacy is requesting an updated to sig for this.   Current sig:  DULoxetine (CYMBALTA) 30 MG capsule 60 capsule 0 04/13/2023    Sig - Route: Take 1-2 capsules (30-60 mg total) by mouth daily. First week take one capsule after that two daily, next rx will be 60 mg - Oral    Pharmacy would like sig for 60mg . Please advise.  [facility] [phone]

## 2023-06-03 ENCOUNTER — Other Ambulatory Visit: Payer: Self-pay | Admitting: Family Medicine

## 2023-06-03 DIAGNOSIS — R600 Localized edema: Secondary | ICD-10-CM

## 2023-06-08 ENCOUNTER — Other Ambulatory Visit: Payer: Self-pay

## 2023-06-08 DIAGNOSIS — G620 Drug-induced polyneuropathy: Secondary | ICD-10-CM

## 2023-06-08 DIAGNOSIS — F325 Major depressive disorder, single episode, in full remission: Secondary | ICD-10-CM

## 2023-06-08 MED ORDER — DULOXETINE HCL 60 MG PO CPEP: 60.0000 mg | ORAL_CAPSULE | Freq: Every day | ORAL | 0 refills | Status: DC

## 2023-06-09 ENCOUNTER — Other Ambulatory Visit: Payer: Self-pay

## 2023-06-09 DIAGNOSIS — F325 Major depressive disorder, single episode, in full remission: Secondary | ICD-10-CM

## 2023-06-09 DIAGNOSIS — T451X5A Adverse effect of antineoplastic and immunosuppressive drugs, initial encounter: Secondary | ICD-10-CM

## 2023-06-09 MED ORDER — DULOXETINE HCL 60 MG PO CPEP
60.0000 mg | ORAL_CAPSULE | Freq: Every day | ORAL | 0 refills | Status: DC
Start: 2023-06-09 — End: 2023-08-11

## 2023-06-15 ENCOUNTER — Ambulatory Visit
Admission: RE | Admit: 2023-06-15 | Discharge: 2023-06-15 | Disposition: A | Discharge status: Home / Self Care | Payer: 59 | Source: Ambulatory Visit | Attending: Family Medicine | Admitting: Family Medicine

## 2023-06-15 DIAGNOSIS — Z1231 Encounter for screening mammogram for malignant neoplasm of breast: Secondary | ICD-10-CM | POA: Diagnosis present

## 2023-08-10 NOTE — Progress Notes (Unsigned)
Name: Molly [name]   MRN: [id number]    DOB: [birth year]   Date:08/11/2023       Progress Note  Subjective  Chief Complaint  Follow Up  HPI  Asthma: mild persistent: she is on daily Breo prn  and states no longer having daily cough, wheezing or SOB.  Major Depression: currently in remission , she has been taking Duloxetine now. Phq 9 was negative today    Cardiomyopathy without CHF: lower extremity edema but mild and stable, she denies orthopnea, PND, chest pain  or SOB . She is on ARB and Repatha unable to tolerate statin therapy    Endometrial cancer: s/p hysterectomy, finished the chemo and radiation therapy with Dr. Rushie Chestnut , no longer has a port, she had a CT pelvis 10/2020 and negative for recurrence of cancer. She is up to date with visits with Dr. Smith Robert and Aggie Cosier, also sees GYN and last CA 125 was 11 . She has neuropathy from chemotherapy, she continues to have intermittent pain on her toes and fingers , described as tingling, numbness and pain, we increased dose of lyrica and switched from lexapro to duloxetine and symptoms seemed to have improved . She is up to date with follow up with oncologist . She is seeing oncologist yearly and also sees gyn. Stable   Morbid obesity: off victoza because of episode of pancreatitis,. She gained from 213 lbs to 236 lbs after treatment for endometrial cancer, weight was in the 240 lbs for months but it has now been stable between 226-228 lbs., down to 222 lbs but it was right after she had CAP today is back to 229 lbs   HTN: BP is at goal today, continue current regiment, denies chest pain, palpitation   Atherosclerosis aorta/history of TIA and CAD :  she is intolerant to statin therapy, she was on zetia and LDL not at goal, now on Repatha and using it correctly, last LDL was down to 70, we will recheck it yearly   GERD: symptoms are well controlled, she takes pantoprazole prn   Type 2 DM with HTN :  new onset, second level of A1C above 6.5 %,  explained she now has DM type 2, discussed yearly eye exam, urine micro , BP control, dyslipidemia control, yearly foot exam and we will place referral to dietician. She is willing to try Farxiga , discussed possible side effects   Patient Active Problem List   Diagnosis Date Noted   Hypertension associated with type 2 diabetes mellitus (HCC) 08/11/2023   GERD without esophagitis 08/11/2023   Major depression in remission (HCC) 04/23/2021   Morbid obesity (HCC) 04/23/2021   Peripheral neuropathy due to chemotherapy (HCC) 04/23/2021   Myalgia 03/20/2021   Iron deficiency anemia 11/15/2018   Endometrial adenocarcinoma (HCC) 10/29/2018   Umbilical hernia without obstruction and without gangrene    Atherosclerosis of right coronary artery 11/10/2017   Atherosclerosis of abdominal aorta (HCC) 11/10/2017   Prediabetes 10/02/2017   Perennial allergic rhinitis with seasonal variation 05/14/2016   Asthma, mild intermittent, well-controlled 05/14/2016   Hypertension goal BP (blood pressure) < 140/90 12/10/2015   Cardiomyopathy due to hypertension (HCC) 12/10/2015   History of CVA (cerebrovascular accident) without residual deficits 12/10/2015   Hyperlipidemia LDL goal <70 12/10/2015   Statin intolerance 12/10/2015    Past Surgical History:  Procedure Laterality Date   ABDOMINAL HYSTERECTOMY     CHOLECYSTECTOMY     COLONOSCOPY WITH PROPOFOL N/A 01/08/2017   Procedure: COLONOSCOPY WITH PROPOFOL;  Surgeon: Wyline Mood, MD;  Location: [facility] ENDOSCOPY;  Service: Endoscopy;  Laterality: N/A;   COLONOSCOPY WITH PROPOFOL N/A 05/05/2018   Procedure: COLONOSCOPY WITH PROPOFOL;  Surgeon: Wyline Mood, MD;  Location: [facility] ENDOSCOPY;  Service: Gastroenterology;  Laterality: N/A;   COLONOSCOPY WITH PROPOFOL N/A 07/15/2021   Procedure: COLONOSCOPY WITH PROPOFOL;  Surgeon: Wyline Mood, MD;  Location[facility] ENDOSCOPY;  Service: Gastroenterology;  Laterality: N/A;   HERNIA REPAIR  10/2017   umbilical   PORTACATH  PLACEMENT N/A 11/04/2018   Procedure: INSERTION PORT-A-CATH;  Surgeon: Leafy Ro, MD;  Location: [facility] ORS;  Service: General;  Laterality: N/A;   SENTINEL NODE BIOPSY N/A 10/06/2018   Procedure: SENTINEL NODE BIOPSY;  Surgeon: Leida Lauth, MD;  Location: [facility] ORS;  Service: Gynecology;  Laterality: N/A;   UMBILICAL HERNIA REPAIR N/A 11/17/2017   Procedure: HERNIA REPAIR UMBILICAL ADULT;  Surgeon: Leafy Ro, MD;  Location: [facility] ORS;  Service: General;  Laterality: N/A;    Family History  Problem Relation Age of Onset   Stroke Mother    Hypertension Mother    Dementia Mother    Alzheimer's disease Mother    Gout Father    Asthma Father    Hypertension Father    Dementia Father    Healthy Sister    Stroke Brother    Alzheimer's disease Brother    Healthy Daughter    Hypertension Brother    Healthy Brother    Cancer Paternal Grandmother    Healthy Sister    Healthy Sister    Hypertension Sister    Hypertension Sister    Stroke Brother    Alzheimer's disease Brother    Stroke Brother    Hypertension Brother    Stroke Brother    Hypertension Brother    Healthy Brother    Healthy Brother    Hypertension Daughter    Breast cancer Neg Hx     Social History   Tobacco Use   Smoking status: Never   Smokeless tobacco: Never   Tobacco comments:    smoking cessation materials not required  Substance Use Topics   Alcohol use: No    Alcohol/week: 0.0 standard drinks of alcohol     Current Outpatient Medications:    acetaminophen (TYLENOL) 500 MG tablet, Take 2 tablets (1,000 mg total) by mouth every 6 (six) hours as needed for mild pain or moderate pain., Disp: 30 tablet, Rfl: 0   albuterol (VENTOLIN HFA) 108 (90 Base) MCG/ACT inhaler, Inhale 2 puffs into the lungs every 4 (four) hours as needed for wheezing or shortness of breath., Disp: 8.5 each, Rfl: 11   Calcium Carb-Cholecalciferol (CALCIUM 1000 + D PO), Take by mouth., Disp: , Rfl:    cetirizine (ZYRTEC  ALLERGY) 10 MG tablet, Take 1 tablet (10 mg total) by mouth daily., Disp: 30 tablet, Rfl: 1   clopidogrel (PLAVIX) 75 MG tablet, Take 75 mg by mouth daily., Disp: , Rfl:    dapagliflozin propanediol (FARXIGA) 10 MG TABS tablet, Take 1 tablet (10 mg total) by mouth daily before breakfast., Disp: 90 tablet, Rfl: 1   diclofenac Sodium (VOLTAREN) 1 % GEL, Apply 4 grams to the skin 4 times daily. Need appt., Disp: 300 g, Rfl: 0   Evolocumab (REPATHA SURECLICK) 140 MG/ML SOAJ, Inject 140mg  into THE SKIN every 14 DAYS, Disp: 2 mL, Rfl: 5   fluticasone furoate-vilanterol (BREO ELLIPTA) 100-25 MCG/ACT AEPB, Inhale 1 puff into the lungs daily., Disp: 1 each, Rfl: 5   furosemide (LASIX)  40 MG tablet, Take 1 tablet (40 mg total) by mouth daily as needed., Disp: 90 tablet, Rfl: 0   ketoconazole (NIZORAL) 2 % cream, APPLY TO THE AFFECTED AREA(S) of abdominal fold ONCE daily AS NEEDED, Disp: 60 g, Rfl: 0   ofloxacin (OCUFLOX) 0.3 % ophthalmic solution, Place into the left eye., Disp: , Rfl:    olmesartan-hydrochlorothiazide (BENICAR HCT) 20-12.5 MG tablet, Take 1 tablet by mouth every morning., Disp: 90 tablet, Rfl: 1   pantoprazole (PROTONIX) 20 MG tablet, Take 20 mg by mouth daily., Disp: , Rfl:    potassium chloride SA (KLOR-CON M) 20 MEQ tablet, TAKE ONE TABLET BY MOUTH TWICE DAILY, Disp: 180 tablet, Rfl: 1   prednisoLONE acetate (PRED FORTE) 1 % ophthalmic suspension, Place into the left eye., Disp: , Rfl:    pregabalin (LYRICA) 100 MG capsule, Take 1-2 capsules (100-200 mg total) by mouth 2 (two) times daily. One in am and 2 at night, Disp: 90 capsule, Rfl: 5   DULoxetine (CYMBALTA) 60 MG capsule, Take 1 capsule (60 mg total) by mouth daily., Disp: 90 capsule, Rfl: 1  Allergies  Allergen Reactions   Ferumoxytol Anaphylaxis   Liraglutide Other (See Comments)    pancreatitis   Nsaids Hives, Rash and Nausea And Vomiting   Clopidogrel Other (See Comments)    Reports intolerance and states causes myalgias    Statins Other (See Comments) and Nausea And Vomiting    Joint pains   Oxycodone Nausea And Vomiting   Crestor [Rosuvastatin Calcium] Rash   Zetia [Ezetimibe] Other (See Comments)    Muscle weakness and joint pain    I personally reviewed active problem list, medication list, allergies, family history, social history, health maintenance with the patient/caregiver today.   ROS  Ten systems reviewed and is negative except as mentioned in HPI    Objective  Vitals:   08/11/23 1139  BP: 122/72  Pulse: 82  Resp: 18  Temp: 98.1 F (36.7 C)  TempSrc: Oral  SpO2: 93%  Weight: 229 lb 6.4 oz (104.1 kg)  Height: 4\' 11"  (1.499 m)    Body mass index is 46.33 kg/m.  Physical Exam  Constitutional: Patient appears well-developed and well-nourished. Obese  No distress.  HEENT: head atraumatic, normocephalic, pupils equal and reactive to light,, neck supple Cardiovascular: Normal rate, regular rhythm and normal heart sounds.  No murmur heard. No BLE edema. Pulmonary/Chest: Effort normal and breath sounds normal. No respiratory distress. Abdominal: Soft.  There is no tenderness. Psychiatric: Patient has a normal mood and affect. behavior is normal. Judgment and thought content normal.   Recent Results (from the past 2160 hour(s))  POCT HgB A1C     Status: Abnormal   Collection Time: 08/11/23 11:43 AM  Result Value Ref Range   Hemoglobin A1C 6.5 (A) 4.0 - 5.6 %   HbA1c POC (<> result, manual entry)     HbA1c, POC (prediabetic range)     HbA1c, POC (controlled diabetic range)       PHQ2/9:    08/11/2023   11:41 AM 04/13/2023    3:00 PM 04/01/2023   11:11 AM 03/25/2023   11:49 AM 03/23/2023   11:00 AM  Depression screen PHQ 2/9  Decreased Interest 0 0 0 0 0  Down, Depressed, Hopeless 0 0 0 0 0  PHQ - 2 Score 0 0 0 0 0  Altered sleeping 0 3 0 0 1  Tired, decreased energy 0 0 3 0 3  Change in appetite 0 3  0 0 1  Feeling bad or failure about yourself  0 0 0 0 0  Trouble  concentrating 0 0 0 0 0  Moving slowly or fidgety/restless 0 0 0 0 0  Suicidal thoughts 0 0 0 0 0  PHQ-9 Score 0 6 3 0 5  Difficult doing work/chores    Not difficult at all Somewhat difficult    phq 9 is negative   Fall Risk:    08/11/2023   11:41 AM 04/13/2023    3:00 PM 04/01/2023   11:11 AM 03/25/2023   11:49 AM 03/23/2023   11:00 AM  Fall Risk   Falls in the past year? 0 1 1  1   Number falls in past yr:  1 1 1  0  Injury with Fall?  1 1 1  0  Risk for fall due to : No Fall Risks Impaired mobility;Impaired balance/gait Impaired balance/gait  History of fall(s);Impaired balance/gait  Follow up Falls prevention discussed Falls prevention discussed Falls prevention discussed  Falls prevention discussed;Education provided;Falls evaluation completed      Functional Status Survey: Is the patient deaf or have difficulty hearing?: No Does the patient have difficulty seeing, even when wearing glasses/contacts?: No Does the patient have difficulty concentrating, remembering, or making decisions?: No Does the patient have difficulty walking or climbing stairs?: No Does the patient have difficulty dressing or bathing?: No Does the patient have difficulty doing errands alone such as visiting a doctor's office or shopping?: No    Assessment & Plan  1. Hypertension associated with type 2 diabetes mellitus (HCC)  - POCT HgB A1C - Urine Microalbumin w/creat. ratio - dapagliflozin propanediol (FARXIGA) 10 MG TABS tablet; Take 1 tablet (10 mg total) by mouth daily before breakfast.  Dispense: 90 tablet; Refill: 1  Referral dietician   2. Major depression in remission (HCC)  - DULoxetine (CYMBALTA) 60 MG capsule; Take 1 capsule (60 mg total) by mouth daily.  Dispense: 90 capsule; Refill: 1  3. Peripheral neuropathy due to chemotherapy (HCC)  - DULoxetine (CYMBALTA) 60 MG capsule; Take 1 capsule (60 mg total) by mouth daily.  Dispense: 90 capsule; Refill: 1  4. Cardiomyopathy due to  hypertension, without heart failure (HCC)  Stable   5. Atherosclerosis of abdominal aorta (HCC)  On Repatha   6. Morbid obesity (HCC)  Discussed with the patient the risk posed by an increased BMI. Discussed importance of portion control, calorie counting and at least 150 minutes of physical activity weekly. Avoid sweet beverages and drink more water. Eat at least 6 servings of fruit and vegetables daily    7. GERD without esophagitis  Taking PPI prn   8. History of CVA (cerebrovascular accident) without residual deficits  On Repatha   9. Asthma, mild intermittent, well-controlled

## 2023-08-11 ENCOUNTER — Ambulatory Visit: Payer: 59 | Admitting: Family Medicine

## 2023-08-11 ENCOUNTER — Encounter: Payer: Self-pay | Admitting: Family Medicine

## 2023-08-11 VITALS — BP 122/72 | HR 82 | Temp 98.1°F | Resp 18 | Ht 59.0 in | Wt 229.4 lb

## 2023-08-11 DIAGNOSIS — I43 Cardiomyopathy in diseases classified elsewhere: Secondary | ICD-10-CM

## 2023-08-11 DIAGNOSIS — E1159 Type 2 diabetes mellitus with other circulatory complications: Secondary | ICD-10-CM | POA: Diagnosis not present

## 2023-08-11 DIAGNOSIS — F325 Major depressive disorder, single episode, in full remission: Secondary | ICD-10-CM

## 2023-08-11 DIAGNOSIS — T451X5A Adverse effect of antineoplastic and immunosuppressive drugs, initial encounter: Secondary | ICD-10-CM

## 2023-08-11 DIAGNOSIS — I119 Hypertensive heart disease without heart failure: Secondary | ICD-10-CM

## 2023-08-11 DIAGNOSIS — Z7984 Long term (current) use of oral hypoglycemic drugs: Secondary | ICD-10-CM | POA: Diagnosis not present

## 2023-08-11 DIAGNOSIS — I152 Hypertension secondary to endocrine disorders: Secondary | ICD-10-CM | POA: Diagnosis not present

## 2023-08-11 DIAGNOSIS — K219 Gastro-esophageal reflux disease without esophagitis: Secondary | ICD-10-CM

## 2023-08-11 DIAGNOSIS — J4520 Mild intermittent asthma, uncomplicated: Secondary | ICD-10-CM

## 2023-08-11 DIAGNOSIS — I700 Atherosclerosis of aorta: Secondary | ICD-10-CM

## 2023-08-11 DIAGNOSIS — Z8673 Personal history of transient ischemic attack (TIA), and cerebral infarction without residual deficits: Secondary | ICD-10-CM

## 2023-08-11 DIAGNOSIS — G620 Drug-induced polyneuropathy: Secondary | ICD-10-CM

## 2023-08-11 LAB — POCT GLYCOSYLATED HEMOGLOBIN (HGB A1C): Hemoglobin A1C: 6.5 % — AB (ref 4.0–5.6)

## 2023-08-11 MED ORDER — DAPAGLIFLOZIN PROPANEDIOL 10 MG PO TABS
10.0000 mg | ORAL_TABLET | Freq: Every day | ORAL | 1 refills | Status: DC
Start: 2023-08-11 — End: 2024-02-02

## 2023-08-11 MED ORDER — DULOXETINE HCL 60 MG PO CPEP: 60.0000 mg | ORAL_CAPSULE | Freq: Every day | ORAL | 1 refills | Status: DC

## 2023-08-19 ENCOUNTER — Inpatient Hospital Stay: Payer: 59

## 2023-08-25 ENCOUNTER — Other Ambulatory Visit: Payer: Self-pay | Admitting: Family Medicine

## 2023-08-30 ENCOUNTER — Other Ambulatory Visit: Payer: Self-pay | Admitting: Family Medicine

## 2023-09-03 ENCOUNTER — Other Ambulatory Visit: Payer: Self-pay | Admitting: Family Medicine

## 2023-09-03 DIAGNOSIS — I10 Essential (primary) hypertension: Secondary | ICD-10-CM

## 2023-09-03 DIAGNOSIS — I119 Hypertensive heart disease without heart failure: Secondary | ICD-10-CM

## 2023-09-03 DIAGNOSIS — R600 Localized edema: Secondary | ICD-10-CM

## 2023-09-07 ENCOUNTER — Other Ambulatory Visit: Payer: Self-pay | Admitting: Family Medicine

## 2023-09-07 DIAGNOSIS — T451X5A Adverse effect of antineoplastic and immunosuppressive drugs, initial encounter: Secondary | ICD-10-CM

## 2023-09-16 ENCOUNTER — Encounter: Payer: Self-pay | Admitting: Obstetrics and Gynecology

## 2023-09-16 ENCOUNTER — Inpatient Hospital Stay: Payer: 59 | Attending: Oncology | Admitting: Obstetrics and Gynecology

## 2023-09-16 VITALS — BP 161/70 | HR 65 | Temp 97.6°F | Resp 19 | Wt 231.4 lb

## 2023-09-16 DIAGNOSIS — Z08 Encounter for follow-up examination after completed treatment for malignant neoplasm: Secondary | ICD-10-CM | POA: Diagnosis not present

## 2023-09-16 DIAGNOSIS — Z9221 Personal history of antineoplastic chemotherapy: Secondary | ICD-10-CM | POA: Diagnosis not present

## 2023-09-16 DIAGNOSIS — Z90710 Acquired absence of both cervix and uterus: Secondary | ICD-10-CM | POA: Diagnosis not present

## 2023-09-16 DIAGNOSIS — Z90722 Acquired absence of ovaries, bilateral: Secondary | ICD-10-CM | POA: Insufficient documentation

## 2023-09-16 DIAGNOSIS — C541 Malignant neoplasm of endometrium: Secondary | ICD-10-CM

## 2023-09-16 DIAGNOSIS — Z923 Personal history of irradiation: Secondary | ICD-10-CM | POA: Insufficient documentation

## 2023-09-16 DIAGNOSIS — Z8542 Personal history of malignant neoplasm of other parts of uterus: Secondary | ICD-10-CM | POA: Insufficient documentation

## 2023-09-16 NOTE — Progress Notes (Signed)
Gynecologic Oncology Interval Visit   Referring Provider: Dr. Valentino Saxon  Chief Complaint: FIGO Stage IIIc1 grade 2 endometrioid endometrial cancer  Subjective:  Molly [name] is a 72 y.o. female, initially seen in consultation for Dr. Valentino Saxon, grade 2 endometrioid endometrial cancer, s/p TLH-BSO with SLN mapping and biopsies on 10/06/18 with Dr. Johnnette Litter and Dr. Valentino Saxon at [facility], followed by chemotherapy and radiation with vaginal cuff boost, who returns to clinic today for continued surveillance.   CA 125 has been followed by Dr. Smith Robert.  02/04/2019 11.4 07/18/2019 10.2 10/05/2019 9.6 07/13/2020 10.1 01/29/21  13 07/30/21  11.  02/17/22 11  She continues to feel well. Neuropathy is stable. She continues lyrica.   Gynecologic Oncology History:  She has history of Grade 3 uterine prolapse with Grade 1 cystocele and rectocele. She noted pessary had become dislodged after coughing d/t pneumonia and presented to Dr. Valentino Saxon for evaluation 05/2018. At that time she noted post-menopausal bleeding and cramping and has history of cervical polyps. Bleeding was felt to be possibly related to pessary.   Ultrasound revealed: uterus measuring 10.2 x 5.5 x 5.7 cm, heterogenous with evidence of fibroids invading endometrium measuring 2.9 x 3.3 x 3.6 cm and two additional fibroids measuring 2.0 x 2.1 x 1.8, and 2.9 x 3.3 x 3.6. Endometrium measuring 4.7 mm. Neither ovary was visualized.   She had second episode of bleeding 08/31/18-09/04/18, felt to be heavier and more 'period like'. Endometrial biopsy was performed. Pathology: - Endometrioid adenocarcinoma, figo grade 1.   Pathology:  DIAGNOSIS:  A. UTERUS WITH CERVIX AND BILATERAL FALLOPIAN TUBES AND OVARIES; TOTAL HYSTERECTOMY WITH BILATERAL SALPINGO-OOPHORECTOMY:  - ENDOMETRIAL ADENOCARCINOMA.  - SEE CANCER SUMMARY BELOW.  - CERVIX WITH NABOTHIAN CYSTS, CHRONIC CERVICITIS WITH SQUAMOUS METAPLASIA, AND 0.9 CM ENDOCERVICAL POLYP.  - INACTIVE BACKGROUND ENDOMETRIUM WITH  ENDOMETRIAL POLYP.  - MYOMETRIUM WITH ADENOMYOSIS AND LEIOMYOMATA, LARGEST MEASURING 4.5 CM.  - UNREMARKABLE FALLOPIAN TUBES AND OVARIES.   B.  SENTINEL LYMPH NODE, LEFT OBTURATOR; EXCISION:  - METASTATIC CARCINOMA INVOLVES ONE LYMPH NODE (1/1).   C.  SENTINEL LYMPH NODE, LEFT EXTERNAL ILIAC; EXCISION:  - LYMPHOID TISSUE NOT PRESENT.  - NEGATIVE FOR MALIGNANCY.   D.  SENTINEL LYMPH NODE, RIGHT EXTERNAL ILIAC; EXCISION:  - METASTATIC CARCINOMA INVOLVES ONE OF TWO LYMPH NODES (1/2).   LVSI present and atypical washings.  Invasion 6/18 and cervix and adnexa negative.   MLH1: LOSS of protein expression  MSH2: Intact nuclear expression  MSH6: Intact nuclear expression  PMS2: LOSS of protein expression   MLH1- positive methylation HER-2 negative  Pathologic Stage: FIGO stage IIIC1 grade 2  PET CT scan showed metastatic adenopathy and retroperitoneal and external iliac group of lymph nodes in the right side.  No evidence of metastatic disease elsewhere.  She received 6 cycles of carbo-taxol on 11/05/2018-02/25/2019. She completed radiation to pelvic and and periaortic nodes 03-31-2019-05/06/2019 with boost to vaginal cuff after 3rd cycle of chemotherapy. She completed on 06/15/2019.   She had a covid infection in September 2020. Port has been removed.   Problem List: Patient Active Problem List   Diagnosis Date Noted   Hypertension associated with type 2 diabetes mellitus (HCC) 08/11/2023   GERD without esophagitis 08/11/2023   Major depression in remission (HCC) 04/23/2021   Morbid obesity (HCC) 04/23/2021   Peripheral neuropathy due to chemotherapy (HCC) 04/23/2021   Myalgia 03/20/2021   Iron deficiency anemia 11/15/2018   Endometrial adenocarcinoma (HCC) 10/29/2018   Umbilical hernia without obstruction and without gangrene  Atherosclerosis of right coronary artery 11/10/2017   Atherosclerosis of abdominal aorta (HCC) 11/10/2017   Prediabetes 10/02/2017   Perennial allergic  rhinitis with seasonal variation 05/14/2016   Asthma, mild intermittent, well-controlled 05/14/2016   Hypertension goal BP (blood pressure) < 140/90 12/10/2015   Cardiomyopathy due to hypertension (HCC) 12/10/2015   History of CVA (cerebrovascular accident) without residual deficits 12/10/2015   Hyperlipidemia LDL goal <70 12/10/2015   Statin intolerance 12/10/2015    Past Medical History: Past Medical History:  Diagnosis Date   Anxiety    Asthma    history of asthma   Cardiomyopathy (HCC)    Complication of anesthesia    tore hair out and made teeth rough   COVID-19 virus infection 09/08/2019   Depression    Endometrial cancer (HCC) 08/2018   GERD (gastroesophageal reflux disease)    takes prilosec prn   History of CVA (cerebrovascular accident) 12/10/2015   Hyperlipidemia LDL goal <70 12/10/2015   Hypertension    Prediabetes 10/02/2017   A1c 6 in January 2018   Stroke [facility]) 1990    no residual effects    Past Surgical History: Past Surgical History:  Procedure Laterality Date   ABDOMINAL HYSTERECTOMY     CHOLECYSTECTOMY     COLONOSCOPY WITH PROPOFOL N/A 01/08/2017   Procedure: COLONOSCOPY WITH PROPOFOL;  Surgeon: Wyline Mood, MD;  Location: [facility] ENDOSCOPY;  Service: Endoscopy;  Laterality: N/A;   COLONOSCOPY WITH PROPOFOL N/A 05/05/2018   Procedure: COLONOSCOPY WITH PROPOFOL;  Surgeon: Wyline Mood, MD;  Location: [facility] ENDOSCOPY;  Service: Gastroenterology;  Laterality: N/A;   COLONOSCOPY WITH PROPOFOL N/A 07/15/2021   Procedure: COLONOSCOPY WITH PROPOFOL;  Surgeon: Wyline Mood, MD;  Location: [facility] ENDOSCOPY;  Service: Gastroenterology;  Laterality: N/A;   HERNIA REPAIR  10/2017   umbilical   PORTACATH PLACEMENT N/A 11/04/2018   Procedure: INSERTION PORT-A-CATH;  Surgeon: Leafy Ro, MD;  Location: [facility] ORS;  Service: General;  Laterality: N/A;   SENTINEL NODE BIOPSY N/A 10/06/2018   Procedure: SENTINEL NODE BIOPSY;  Surgeon: Leida Lauth, MD;  Location: [facility] ORS;   Service: Gynecology;  Laterality: N/A;   UMBILICAL HERNIA REPAIR N/A 11/17/2017   Procedure: HERNIA REPAIR UMBILICAL ADULT;  Surgeon: Leafy Ro, MD;  Location: [facility] ORS;  Service: General;  Laterality: N/A;   Past Gynecologic History:  [id number] Denies abnormal pap smears.  History of OCP use.  Denies STD history  OB History:  OB History  Gravida Para Term Preterm AB Living  2 2 2     2   SAB IAB Ectopic Multiple Live Births          2    # Outcome Date GA Lbr Len/2nd Weight Sex Type Anes PTL Lv  2 Term 68    F Vag-Spont   LIV  1 Term 12    F Vag-Spont   LIV    Family History: Family History  Problem Relation Age of Onset   Stroke Mother    Hypertension Mother    Dementia Mother    Alzheimer's disease Mother    Gout Father    Asthma Father    Hypertension Father    Dementia Father    Healthy Sister    Stroke Brother    Alzheimer's disease Brother    Healthy Daughter    Hypertension Brother    Healthy Brother    Cancer Paternal Grandmother    Healthy Sister    Healthy Sister    Hypertension Sister    Hypertension  Sister    Stroke Brother    Alzheimer's disease Brother    Stroke Brother    Hypertension Brother    Stroke Brother    Hypertension Brother    Healthy Brother    Healthy Brother    Hypertension Daughter    Breast cancer Neg Hx    Social History: Social History   Socioeconomic History   Marital status: Divorced    Spouse name: Not on file   Number of children: 2   Years of education: some college   Highest education level: 12th grade  Occupational History   Occupation: Retired    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   Occupation: currently a Education officer, environmental  Tobacco Use   Smoking status: Never   Smokeless tobacco: Never   Tobacco comments:    smoking cessation materials not required  Vaping Use   Vaping status: Never Used  Substance and Sexual Activity   Alcohol use: No    Alcohol/week: 0.0 standard drinks of alcohol   Drug use: No    Sexual activity: Not Currently  Other Topics Concern   Not on file  Social History Narrative   Pt lives alone but currently staying with her daughter   Social Determinants of Health   Financial Resource Strain: Low Risk  (05/22/2022)   Overall Financial Resource Strain (CARDIA)    Difficulty of Paying Living Expenses: Not hard at all  Food Insecurity: No Food Insecurity (05/22/2022)   Hunger Vital Sign    Worried About Running Out of Food in the Last Year: Never true    Ran Out of Food in the Last Year: Never true  Transportation Needs: No Transportation Needs (05/22/2022)   PRAPARE - Administrator, Civil Service (Medical): No    Lack of Transportation (Non-Medical): No  Physical Activity: Inactive (05/22/2022)   Exercise Vital Sign    Days of Exercise per Week: 0 days    Minutes of Exercise per Session: 0 min  Stress: No Stress Concern Present (05/22/2022)   [employer] of Occupational Health - Occupational Stress Questionnaire    Feeling of Stress : Not at all  Social Connections: Moderately Integrated (05/22/2022)   Social Connection and Isolation Panel [NHANES]    Frequency of Communication with Friends and Family: More than three times a week    Frequency of Social Gatherings with Friends and Family: Three times a week    Attends Religious Services: More than 4 times per year    Active Member of Clubs or Organizations: Yes    Attends Banker Meetings: More than 4 times per year    Marital Status: Divorced  Intimate Partner Violence: Not At Risk (05/22/2022)   Humiliation, Afraid, Rape, and Kick questionnaire    Fear of Current or Ex-Partner: No    Emotionally Abused: No    Physically Abused: No    Sexually Abused: No    Allergies: Allergies  Allergen Reactions   Ferumoxytol Anaphylaxis   Liraglutide Other (See Comments)    pancreatitis   Nsaids Hives, Rash and Nausea And Vomiting   Clopidogrel Other (See Comments)    Reports intolerance  and states causes myalgias   Statins Other (See Comments) and Nausea And Vomiting    Joint pains   Oxycodone Nausea And Vomiting   Crestor [Rosuvastatin Calcium] Rash   Zetia [Ezetimibe] Other (See Comments)    Muscle weakness and joint pain    Current Medications: Current Outpatient Medications  Medication  Sig Dispense Refill   acetaminophen (TYLENOL) 500 MG tablet Take 2 tablets (1,000 mg total) by mouth every 6 (six) hours as needed for mild pain or moderate pain. 30 tablet 0   albuterol (VENTOLIN HFA) 108 (90 Base) MCG/ACT inhaler Inhale 2 puffs into the lungs every 4 (four) hours as needed for wheezing or shortness of breath. 8.5 each 11   Calcium Carb-Cholecalciferol (CALCIUM 1000 + D PO) Take by mouth.     cetirizine (ZYRTEC ALLERGY) 10 MG tablet Take 1 tablet (10 mg total) by mouth daily. 30 tablet 1   clopidogrel (PLAVIX) 75 MG tablet Take 75 mg by mouth daily.     dapagliflozin propanediol (FARXIGA) 10 MG TABS tablet Take 1 tablet (10 mg total) by mouth daily before breakfast. 90 tablet 1   diclofenac Sodium (VOLTAREN) 1 % GEL Apply 4 grams to the skin 4 times daily. Need appt. 300 g 0   DULoxetine (CYMBALTA) 60 MG capsule Take 1 capsule (60 mg total) by mouth daily. 90 capsule 1   Evolocumab (REPATHA SURECLICK) 140 MG/ML SOAJ Inject 140mg  into THE SKIN every 14 DAYS 2 mL 5   fluticasone furoate-vilanterol (BREO ELLIPTA) 100-25 MCG/ACT AEPB Inhale 1 puff into the lungs daily. 1 each 5   furosemide (LASIX) 40 MG tablet TAKE 1 TABLET BY MOUTH ONCE DAILY AS NEEDED 30 tablet 2   ketoconazole (NIZORAL) 2 % cream APPLY TO THE AFFECTED AREA(S) of abdominal fold ONCE daily AS NEEDED 60 g 0   ofloxacin (OCUFLOX) 0.3 % ophthalmic solution Place into the left eye.     olmesartan-hydrochlorothiazide (BENICAR HCT) 20-12.5 MG tablet Take 1 tablet by mouth every morning. 90 tablet 1   pantoprazole (PROTONIX) 20 MG tablet TAKE 1 TABLET BY MOUTH ONCE DAILY *REFILL REQUEST* 90 tablet 0   potassium  chloride SA (KLOR-CON M) 20 MEQ tablet TAKE ONE TABLET BY MOUTH TWICE DAILY 180 tablet 1   prednisoLONE acetate (PRED FORTE) 1 % ophthalmic suspension Place into the left eye.     pregabalin (LYRICA) 100 MG capsule TAKE 1 CAPSULE BY MOUTH EACH MORNING AND TAKE 2 CAPSULES EVERY DAY IN THE EVENING 90 capsule 0   No current facility-administered medications for this visit.   Review of Systems General:  no complaints Skin: no complaints Eyes: no complaints HEENT: no complaints Breasts: no complaints Pulmonary: no complaints Cardiac: no complaints Gastrointestinal: no complaints Genitourinary/Sexual: no complaints Ob/Gyn: no complaints Musculoskeletal: no complaints Hematology: no complaints Neurologic/Psych: peripheral neuropathy   Objective:  Physical Examination:  Wt 231 lb 6.4 oz (105 kg)   BMI 46.74 kg/m     ECOG Performance Status: 0 - Asymptomatic  GENERAL: Patient is a well appearing female in no acute distress HEENT:  Sclera clear. Anicteric NODES:  Negative axillary, supraclavicular, inguinal lymph node survery LUNGS:  Clear to auscultation bilaterally.   HEART:  Regular rate and rhythm.  ABDOMEN:  Soft, nontender.  No hernias, incisions well healed. No masses or ascites EXTREMITIES:  No peripheral edema. Atraumatic. No cyanosis SKIN:  Clear with no obvious rashes or skin changes.  NEURO:  Nonfocal. Well oriented.  Appropriate affect.  Pelvic: chaperoned by CMA. EGBUS: no lesions, discharge. Vagina: no bleeding, discharge. Vaginal cuff well healed. Vagina shortened post radiation. Atrophic. Bimanual: smooth, no masses. Rectal: deferred.   Assessment:  Molly [name] is a 72 y.o. female diagnosed with stage IIIC1 grade 2 endometrial cancer with bilateral positive pelvic SLNs with macroscopic tumor s/p TLH/BSO pelvic SLN biopsies 10/06/18.  Received sandwich therapy with 3 cycles of carboplatin/taxol, external pelvic radiation with vaginal brachytherapy and then 3 more  cycles of chemotherapy completed 6/20.  Clinically, NED today.   Chemotherapy induced peripheral neuropathy- stable on lyrica.  No new complaints.  Tumor has MLH1 loss with promoter methylation. HER-2 negative.   Medical co-morbidities complicating care: Morbid obesity, CVA 1990 with no residual and no medication, HTN with good BP today.    Plan:   Problem List Items Addressed This Visit       Genitourinary   Endometrial adenocarcinoma (HCC) - Primary    She is now almost 5 years from her surgery. Clinically asymptomatic. NED on exam. Continue surveillance. Tumor has MLH1 loss with promoter methylation, so she would be a candidate for immune checkpoint inhibitor therapy if she has a recurrence in the future. HER-2 negative.   Continue follow up with Dr. Smith Robert and Dr. Rushie Chestnut.  Dr Smith Robert will continue to manage her neuropathy.  RTC in 6 months with Dr Smith Robert and Gyn Onc in 1 yearor sooner if concerning symptoms.    Continue routine mammo and colonoscopy follow up.  We will attempt to schedule her mammogram and bone density for her today which was ordered by her PCP. She declines covid vaccine. I encouraged annual flu vaccine.    Leida Lauth, MD  CC:  Alba Cory, MD [address] [place],  [place] [address] [phone]

## 2023-09-18 ENCOUNTER — Ambulatory Visit (INDEPENDENT_AMBULATORY_CARE_PROVIDER_SITE_OTHER): Payer: 59

## 2023-09-18 VITALS — Ht 59.0 in | Wt 231.0 lb

## 2023-09-18 DIAGNOSIS — Z0000 Encounter for general adult medical examination without abnormal findings: Secondary | ICD-10-CM

## 2023-09-18 NOTE — Patient Instructions (Signed)
Ms. Kincheloe , Thank you for taking time to come for your Medicare Wellness Visit. I appreciate your ongoing commitment to your health goals. Please review the following plan we discussed and let me know if I can assist you in the future.   Referrals/Orders/Follow-Ups/Clinician Recommendations: none  This is a list of the screening recommended for you and due dates:  Health Maintenance  Topic Date Due   COVID-19 Vaccine (1) Never done   Complete foot exam   Never done   Eye exam for diabetics  Never done   Flu Shot  10/22/2023*   Zoster (Shingles) Vaccine (1 of 2) 11/10/2023*   Hemoglobin A1C  02/11/2024   Yearly kidney function blood test for diabetes  03/22/2024   Mammogram  06/14/2024   Yearly kidney health urinalysis for diabetes  08/10/2024   Medicare Annual Wellness Visit  09/17/2024   DTaP/Tdap/Td vaccine (2 - Td or Tdap) 11/11/2026   Colon Cancer Screening  07/15/2028   Pneumonia Vaccine  Completed   DEXA scan (bone density measurement)  Completed   Hepatitis C Screening  Completed   HPV Vaccine  Aged Out  *Topic was postponed. The date shown is not the original due date.    Advanced directives: (Copy Requested) Please bring a copy of your health care power of attorney and living will to the office to be added to your chart at your convenience.  Next Medicare Annual Wellness Visit scheduled for next year: Yes 09/23/24 @ 3pm telephone

## 2023-09-18 NOTE — Progress Notes (Signed)
Subjective:   Molly [name] is a 72 y.o. female who presents for Medicare Annual (Subsequent) preventive examination.  Visit Complete: Virtual  I connected with  Molly [name] on 09/18/23 by a audio enabled telemedicine application and verified that I am speaking with the correct person using two identifiers.  Patient Location: Home  Provider Location: Office/Clinic  I discussed the limitations of evaluation and management by telemedicine. The patient expressed understanding and agreed to proceed.  Vital Signs: Unable to obtain new vitals due to this being a telehealth visit.  Patient Medicare AWV questionnaire was completed by the patient on (not done); I have confirmed that all information answered by patient is correct and no changes since this date. Cardiac Risk Factors include: advanced age (>69men, >53 women);dyslipidemia;hypertension;obesity (BMI >30kg/m2);sedentary lifestyle    Objective:    Today's Vitals   09/18/23 1547 09/18/23 1548  Weight: 231 lb (104.8 kg)   Height: 4\' 11"  (1.499 m)   PainSc:  0-No pain   Body mass index is 46.66 kg/m.     09/18/2023    4:00 PM 09/16/2023    1:11 PM 03/15/2023    8:59 PM 02/16/2023    2:14 PM 05/22/2022    3:03 PM 04/04/2022   10:02 AM 02/17/2022    1:09 PM  Advanced Directives  Does Patient Have a Medical Advance Directive? Yes Yes No Yes No Yes Yes  Type of Estate agent of [place];Living will Living will;Healthcare Power of [employer] Power of [place];Living will  Healthcare Power of [place];Living will Healthcare Power of [place];Living will  Does patient want to make changes to medical advance directive?  Yes (ED - Information included in AVS)     No - Patient declined  Copy of Healthcare Power of Attorney in Chart? No - copy requested        Would patient like information on creating a medical advance directive?  Yes (ED - Information included in AVS)  No - Patient declined No - Patient  declined  No - Patient declined    Current Medications (verified) Outpatient Encounter Medications as of 09/18/2023  Medication Sig   acetaminophen (TYLENOL) 500 MG tablet Take 2 tablets (1,000 mg total) by mouth every 6 (six) hours as needed for mild pain or moderate pain.   albuterol (VENTOLIN HFA) 108 (90 Base) MCG/ACT inhaler Inhale 2 puffs into the lungs every 4 (four) hours as needed for wheezing or shortness of breath.   Calcium Carb-Cholecalciferol (CALCIUM 1000 + D PO) Take by mouth.   cetirizine (ZYRTEC ALLERGY) 10 MG tablet Take 1 tablet (10 mg total) by mouth daily.   clopidogrel (PLAVIX) 75 MG tablet Take 75 mg by mouth daily.   dapagliflozin propanediol (FARXIGA) 10 MG TABS tablet Take 1 tablet (10 mg total) by mouth daily before breakfast.   diclofenac Sodium (VOLTAREN) 1 % GEL Apply 4 grams to the skin 4 times daily. Need appt.   DULoxetine (CYMBALTA) 60 MG capsule Take 1 capsule (60 mg total) by mouth daily.   Evolocumab (REPATHA SURECLICK) 140 MG/ML SOAJ Inject 140mg  into THE SKIN every 14 DAYS   fluticasone furoate-vilanterol (BREO ELLIPTA) 100-25 MCG/ACT AEPB Inhale 1 puff into the lungs daily.   furosemide (LASIX) 40 MG tablet TAKE 1 TABLET BY MOUTH ONCE DAILY AS NEEDED   ketoconazole (NIZORAL) 2 % cream APPLY TO THE AFFECTED AREA(S) of abdominal fold ONCE daily AS NEEDED   ofloxacin (OCUFLOX) 0.3 % ophthalmic solution Place into the left  eye.   olmesartan-hydrochlorothiazide (BENICAR HCT) 20-12.5 MG tablet Take 1 tablet by mouth every morning.   pantoprazole (PROTONIX) 20 MG tablet TAKE 1 TABLET BY MOUTH ONCE DAILY *REFILL REQUEST*   potassium chloride SA (KLOR-CON M) 20 MEQ tablet TAKE ONE TABLET BY MOUTH TWICE DAILY   prednisoLONE acetate (PRED FORTE) 1 % ophthalmic suspension Place into the left eye.   pregabalin (LYRICA) 100 MG capsule TAKE 1 CAPSULE BY MOUTH EACH MORNING AND TAKE 2 CAPSULES EVERY DAY IN THE EVENING   No facility-administered encounter medications on  file as of 09/18/2023.    Allergies (verified) Ferumoxytol, Liraglutide, Nsaids, Clopidogrel, Statins, Oxycodone, Crestor [rosuvastatin calcium], and Zetia [ezetimibe]   History: Past Medical History:  Diagnosis Date   Anxiety    Asthma    history of asthma   Cardiomyopathy (HCC)    Complication of anesthesia    tore hair out and made teeth rough   COVID-19 virus infection 09/08/2019   Depression    Endometrial cancer (HCC) 08/2018   GERD (gastroesophageal reflux disease)    takes prilosec prn   History of CVA (cerebrovascular accident) 12/10/2015   Hyperlipidemia LDL goal <70 12/10/2015   Hypertension    Prediabetes 10/02/2017   A1c 6 in January 2018   Stroke [facility]) 1990    no residual effects   Past Surgical History:  Procedure Laterality Date   ABDOMINAL HYSTERECTOMY     CHOLECYSTECTOMY     COLONOSCOPY WITH PROPOFOL N/A 01/08/2017   Procedure: COLONOSCOPY WITH PROPOFOL;  Surgeon: Wyline Mood, MD;  Location: [facility] ENDOSCOPY;  Service: Endoscopy;  Laterality: N/A;   COLONOSCOPY WITH PROPOFOL N/A 05/05/2018   Procedure: COLONOSCOPY WITH PROPOFOL;  Surgeon: Wyline Mood, MD;  Location[facility] ENDOSCOPY;  Service: Gastroenterology;  Laterality: N/A;   COLONOSCOPY WITH PROPOFOL N/A 07/15/2021   Procedure: COLONOSCOPY WITH PROPOFOL;  Surgeon: Wyline Mood, MD;  Location: [facility] ENDOSCOPY;  Service: Gastroenterology;  Laterality: N/A;   HERNIA REPAIR  10/2017   umbilical   PORTACATH PLACEMENT N/A 11/04/2018   Procedure: INSERTION PORT-A-CATH;  Surgeon: Leafy Ro, MD;  Location: [facility] ORS;  Service: General;  Laterality: N/A;   SENTINEL NODE BIOPSY N/A 10/06/2018   Procedure: SENTINEL NODE BIOPSY;  Surgeon: Leida Lauth, MD;  Location: [facility] ORS;  Service: Gynecology;  Laterality: N/A;   UMBILICAL HERNIA REPAIR N/A 11/17/2017   Procedure: HERNIA REPAIR UMBILICAL ADULT;  Surgeon: Leafy Ro, MD;  Location: [facility] ORS;  Service: General;  Laterality: N/A;   Family History  Problem  Relation Age of Onset   Stroke Mother    Hypertension Mother    Dementia Mother    Alzheimer's disease Mother    Gout Father    Asthma Father    Hypertension Father    Dementia Father    Healthy Sister    Stroke Brother    Alzheimer's disease Brother    Healthy Daughter    Hypertension Brother    Healthy Brother    Cancer Paternal Grandmother    Healthy Sister    Healthy Sister    Hypertension Sister    Hypertension Sister    Stroke Brother    Alzheimer's disease Brother    Stroke Brother    Hypertension Brother    Stroke Brother    Hypertension Brother    Healthy Brother    Healthy Brother    Hypertension Daughter    Breast cancer Neg Hx    Social History   Socioeconomic History   Marital status: Divorced  Spouse name: Not on file   Number of children: 2   Years of education: some college   Highest education level: 12th grade  Occupational History   Occupation: Retired    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   Occupation: currently a Education officer, environmental  Tobacco Use   Smoking status: Never   Smokeless tobacco: Never   Tobacco comments:    smoking cessation materials not required  Vaping Use   Vaping status: Never Used  Substance and Sexual Activity   Alcohol use: No    Alcohol/week: 0.0 standard drinks of alcohol   Drug use: No   Sexual activity: Not Currently  Other Topics Concern   Not on file  Social History Narrative   Pt lives alone but currently staying with her daughter   Social Determinants of Health   Financial Resource Strain: Low Risk  (09/18/2023)   Overall Financial Resource Strain (CARDIA)    Difficulty of Paying Living Expenses: Not hard at all  Food Insecurity: No Food Insecurity (09/18/2023)   Hunger Vital Sign    Worried About Running Out of Food in the Last Year: Never true    Ran Out of Food in the Last Year: Never true  Transportation Needs: No Transportation Needs (09/18/2023)   PRAPARE - Administrator, Civil Service  (Medical): No    Lack of Transportation (Non-Medical): No  Physical Activity: Inactive (09/18/2023)   Exercise Vital Sign    Days of Exercise per Week: 0 days    Minutes of Exercise per Session: 0 min  Stress: No Stress Concern Present (09/18/2023)   [employer] of Occupational Health - Occupational Stress Questionnaire    Feeling of Stress : Not at all  Social Connections: Moderately Integrated (09/18/2023)   Social Connection and Isolation Panel [NHANES]    Frequency of Communication with Friends and Family: More than three times a week    Frequency of Social Gatherings with Friends and Family: Three times a week    Attends Religious Services: More than 4 times per year    Active Member of Clubs or Organizations: Yes    Attends Engineer, structural: More than 4 times per year    Marital Status: Divorced    Tobacco Counseling Counseling given: Not Answered Tobacco comments: smoking cessation materials not required   Clinical Intake:  Pre-visit preparation completed: No  Pain : No/denies pain Pain Score: 0-No pain     BMI - recorded: 46.66 Nutritional Status: BMI > 30  Obese Nutritional Risks: None Diabetes: No  How often do you need to have someone help you when you read instructions, pamphlets, or other written materials from your doctor or pharmacy?: 1 - Never  Interpreter Needed?: No  Comments: lives with her daughter Information entered by :: B.Debraann Livingstone,LPN   Activities of Daily Living    09/18/2023    4:00 PM 08/11/2023   11:42 AM  In your present state of health, do you have any difficulty performing the following activities:  Hearing? 0 0  Vision? 0 0  Difficulty concentrating or making decisions? 0 0  Walking or climbing stairs? 0 0  Dressing or bathing? 0 0  Doing errands, shopping? 0 0  Preparing Food and eating ? N   Using the Toilet? N   In the past six months, have you accidently leaked urine? Y   Do you have problems with loss of  bowel control? N   Managing your Medications? N  Managing your Finances? N   Housekeeping or managing your Housekeeping? N     Patient Care Team: Alba Cory, MD as PCP - General (Family Medicine) Debbe Odea, MD as PCP - Cardiology (Cardiology) Hildred Laser, MD as Consulting Physician (Obstetrics and Gynecology) Benita Gutter, RN as Registered Nurse Carmina Miller, MD as Referring Physician (Radiation Oncology) Creig Hines, MD as Consulting Physician (Oncology) Leida Lauth, MD as Referring Physician (Obstetrics) Gaspar Cola, [facility] (Inactive) (Pharmacist) Duanne Limerick (Neurology) Henreitta Leber, MD as Consulting Physician (Oncology)  Indicate any recent Medical Services you may have received from other than [facility] providers in the past year (date may be approximate).     Assessment:   This is a routine wellness examination for Akaiya.  Hearing/Vision screen Hearing Screening - Comments:: Pt says hears well  Vision Screening - Comments:: Pt says vision is good;eye appt this week Monday Dr Alexia Freestone   Goals Addressed             This Visit's Progress    DIET - INCREASE WATER INTAKE   Not on track    Recommend to drink at least 6-8 8oz glasses of water per day.     Track and Manage My Symptoms-Asthma   On track    Timeframe:  Long-Range Goal Priority:  High Start Date: 04/03/2021                            Expected End Date:  10/04/2023                     Follow Up within 90 days   - avoid symptom triggers outdoors - eliminate symptom triggers at home - keep rescue medicines on hand  - take long-acting maintenance inhaler every daily    Why is this important?   Keeping track of asthma symptoms can tell you a lot about your asthma control.  Based on symptoms and peak flow results you can see how well you are doing.  Your asthma action plan has a green, yellow and red zone. Green means all is good; it is your goal. Yellow means  your symptoms are a little worse. You will need to adjust your medications. Being in the red zone means that your   asthma is out of control. You will need to use your rescue medicines. You may need emergency care.     Notes:        Depression Screen    09/18/2023    3:55 PM 08/11/2023   11:41 AM 04/13/2023    3:00 PM 04/01/2023   11:11 AM 03/25/2023   11:49 AM 03/23/2023   11:00 AM 11/12/2022    4:03 PM  PHQ 2/9 Scores  PHQ - 2 Score 0 0 0 0 0 0 0  PHQ- 9 Score  0 6 3 0 5 0    Fall Risk    09/18/2023    3:50 PM 08/11/2023   11:41 AM 04/13/2023    3:00 PM 04/01/2023   11:11 AM 03/25/2023   11:49 AM  Fall Risk   Falls in the past year? 0 0 1 1   Number falls in past yr: 0  1 1 1   Injury with Fall? 0  1 1 1   Risk for fall due to : No Fall Risks No Fall Risks Impaired mobility;Impaired balance/gait Impaired balance/gait   Follow up Falls prevention discussed;Education provided Falls prevention  discussed Falls prevention discussed Falls prevention discussed     MEDICARE RISK AT HOME: Medicare Risk at Home Any stairs in or around the home?: Yes If so, are there any without handrails?: Yes Home free of loose throw rugs in walkways, pet beds, electrical cords, etc?: Yes Adequate lighting in your home to reduce risk of falls?: Yes Life alert?: No Use of a cane, walker or w/c?: No Grab bars in the bathroom?: Yes Shower chair or bench in shower?: No Elevated toilet seat or a handicapped toilet?: No  TIMED UP AND GO:  Was the test performed?  No    Cognitive Function:        09/18/2023    4:02 PM 05/03/2020    8:59 AM 04/07/2019   12:16 PM 04/06/2018    3:22 PM  6CIT Screen  What Year? 0 points 0 points 0 points 0 points  What month? 0 points 0 points 0 points 0 points  What time? 0 points 0 points 0 points 3 points  Count back from 20 0 points 0 points 0 points 0 points  Months in reverse 0 points 0 points 0 points 0 points  Repeat phrase 0 points 0 points 0 points 0 points   Total Score 0 points 0 points 0 points 3 points    Immunizations Immunization History  Administered Date(s) Administered   Fluad Quad(high Dose 65+) 10/05/2019, 10/15/2020, 11/12/2022   Influenza, High Dose Seasonal PF 11/11/2016, 10/02/2017, 09/07/2018, 08/26/2021   Pneumococcal Conjugate-13 04/06/2018   Pneumococcal Polysaccharide-23 11/11/2016   Tdap 11/11/2016    TDAP status: Up to date  Flu Vaccine status: Due, Education has been provided regarding the importance of this vaccine. Advised may receive this vaccine at local pharmacy or Health Dept. Aware to provide a copy of the vaccination record if obtained from local pharmacy or Health Dept. Verbalized acceptance and understanding.  Pneumococcal vaccine status: Up to date  Covid-19 vaccine status: Declined, Education has been provided regarding the importance of this vaccine but patient still declined. Advised may receive this vaccine at local pharmacy or Health Dept.or vaccine clinic. Aware to provide a copy of the vaccination record if obtained from local pharmacy or Health Dept. Verbalized acceptance and understanding.  Qualifies for Shingles Vaccine? Yes   Zostavax completed No   Shingrix Completed?: No.    Education has been provided regarding the importance of this vaccine. Patient has been advised to call insurance company to determine out of pocket expense if they have not yet received this vaccine. Advised may also receive vaccine at local pharmacy or Health Dept. Verbalized acceptance and understanding.  Screening Tests Health Maintenance  Topic Date Due   COVID-19 Vaccine (1) Never done   FOOT EXAM  Never done   OPHTHALMOLOGY EXAM  Never done   INFLUENZA VACCINE  10/22/2023 (Originally 07/30/2023)   Zoster Vaccines- Shingrix (1 of 2) 11/10/2023 (Originally 11/07/1970)   HEMOGLOBIN A1C  02/11/2024   Diabetic kidney evaluation - eGFR measurement  03/22/2024   MAMMOGRAM  06/14/2024   Diabetic kidney evaluation - Urine  ACR  08/10/2024   Medicare Annual Wellness (AWV)  09/17/2024   DTaP/Tdap/Td (2 - Td or Tdap) 11/11/2026   Colonoscopy  07/15/2028   Pneumonia Vaccine 26+ Years old  Completed   DEXA SCAN  Completed   Hepatitis C Screening  Completed   HPV VACCINES  Aged Out    Health Maintenance  Health Maintenance Due  Topic Date Due   COVID-19 Vaccine (1) Never done  FOOT EXAM  Never done   OPHTHALMOLOGY EXAM  Never done    Colorectal cancer screening: Type of screening: Colonoscopy. Completed yes. Repeat every 5-10 years  Mammogram status: Completed yes. Repeat every year  Bone Density status: Completed yes. Results reflect: Bone density results: NORMAL. Repeat every 5 years.  Lung Cancer Screening: (Low Dose CT Chest recommended if Age 35-80 years, 20 pack-year currently smoking OR have quit w/in 15years.) does not qualify.   Lung Cancer Screening Referral: no  Additional Screening:  Hepatitis C Screening: does not qualify; Completed yes  Vision Screening: Recommended annual ophthalmology exams for early detection of glaucoma and other disorders of the eye. Is the patient up to date with their annual eye exam?  Yes  Who is the provider or what is the name of the office in which the patient attends annual eye exams? Dr Alexia Freestone If pt is not established with a provider, would they like to be referred to a provider to establish care? No .   Dental Screening: Recommended annual dental exams for proper oral hygiene  Diabetic Foot Exam: due 12/24 w/PCP  Community Resource Referral / Chronic Care Management: CRR required this visit?  No   CCM required this visit?  No     Plan:     I have personally reviewed and noted the following in the patient's chart:   Medical and social history Use of alcohol, tobacco or illicit drugs  Current medications and supplements including opioid prescriptions. Patient is not currently taking opioid prescriptions. Functional ability and  status Nutritional status Physical activity Advanced directives List of other physicians Hospitalizations, surgeries, and ER visits in previous 12 months Vitals Screenings to include cognitive, depression, and falls Referrals and appointments  In addition, I have reviewed and discussed with patient certain preventive protocols, quality metrics, and best practice recommendations. A written personalized care plan for preventive services as well as general preventive health recommendations were provided to patient.     Sue Lush, LPN   07/23/3663   After Visit Summary: (MyChart) Due to this being a telephonic visit, the after visit summary with patients personalized plan was offered to patient via MyChart   Nurse Notes: The patient states she is doing well and has no concerns or questions at this time.

## 2023-09-23 ENCOUNTER — Encounter: Payer: 59 | Attending: Family Medicine | Admitting: Dietician

## 2023-09-23 ENCOUNTER — Encounter: Payer: Self-pay | Admitting: Dietician

## 2023-09-23 VITALS — Ht 59.0 in | Wt 229.6 lb

## 2023-09-23 DIAGNOSIS — E119 Type 2 diabetes mellitus without complications: Secondary | ICD-10-CM | POA: Diagnosis present

## 2023-09-23 DIAGNOSIS — Z713 Dietary counseling and surveillance: Secondary | ICD-10-CM | POA: Diagnosis not present

## 2023-09-23 DIAGNOSIS — E1159 Type 2 diabetes mellitus with other circulatory complications: Secondary | ICD-10-CM

## 2023-09-23 NOTE — Progress Notes (Signed)
Diabetes Self-Management Education  Visit Type: First/Initial  Appt. Start Time: 9:15 Appt. End Time: 10:40  09/23/2023  [name], identified by name and date of birth, is a 72 y.o. female with a diagnosis of Diabetes: Type 2.   ASSESSMENT  Height 4\' 11"  (1.499 m), weight 229 lb 9.6 oz (104.1 kg). Body mass index is 46.37 kg/m.   Diabetes Self-Management Education - 09/23/23 0932       Visit Information   Visit Type First/Initial      Initial Visit   Diabetes Type Type 2    Are you currently following a meal plan? No    Are you taking your medications as prescribed? Yes      Health Coping   How would you rate your overall health? Good      Psychosocial Assessment   Patient Belief/Attitude about Diabetes Motivated to manage diabetes    Other persons present Friend    Special Needs None    Preferred Learning Style Hands on;Auditory;Visual    Learning Readiness Ready    How often do you need to have someone help you when you read instructions, pamphlets, or other written materials from your doctor or pharmacy? 1 - Never    What is the last grade level you completed in school? 13      Pre-Education Assessment   Patient understands the diabetes disease and treatment process. Needs Instruction    Patient understands incorporating nutritional management into lifestyle. Needs Instruction    Patient undertands incorporating physical activity into lifestyle. Needs Instruction    Patient understands using medications safely. Needs Review    Patient understands monitoring blood glucose, interpreting and using results Needs Instruction    Patient understands prevention, detection, and treatment of acute complications. Needs Instruction    Patient understands prevention, detection, and treatment of chronic complications. Needs Review    Patient understands how to develop strategies to address psychosocial issues. Needs Instruction    Patient understands how to develop  strategies to promote health/change behavior. Needs Review      Complications   Last HgB A1C per patient/outside source 6.5 %    How often do you check your blood sugar? 0 times/day (not testing)    Number of hypoglycemic episodes per month --   occasional symptoms   Have you had a dilated eye exam in the past 12 months? Yes    Have you had a dental exam in the past 12 months? No    Are you checking your feet? Yes   neuropathy from chemo   How many days per week are you checking your feet? 7      Dietary Intake   Breakfast not regularly, not hungry, occasional cereal (honey nut cherrios)    Snack (morning) raisens    Lunch fried bologne, cooked egg, potatoes, string beans, beef, hamburger (no bread), bologne sandwiches, mostly eats after 3pm, used to eat: fish, hamburger, mash potatoes and gravy, string beans, corn, simple tossed salad (lettuce and carrots, egg), not too big on vegetables, chicken. Typically eats 1-2 meals a day, mostly 1 when she gets busy.    Snack (afternoon) Popcorn, not a chip person but will have salt and vinegar chips occassionally, some cheetos    Beverage(s) cranberry/cranapple juice, tea, lemonade, coke, sprite or starrie      Activity / Exercise   Activity / Exercise Type ADL's    How many days per week do you exercise? 0    How many minutes  per day do you exercise? 0    Total minutes per week of exercise 0      Patient Education   Disease Pathophysiology Definition of diabetes, type 1 and 2, and the diagnosis of diabetes;Factors that contribute to the development of diabetes    Healthy Eating Role of diet in the treatment of diabetes and the relationship between the three main macronutrients and blood glucose level;Plate Method;Carbohydrate counting;Meal timing in regards to the patients' current diabetes medication.    Being Active Role of exercise on diabetes management, blood pressure control and cardiac health.;Helped patient identify appropriate exercises  in relation to his/her diabetes, diabetes complications and other health issue.    Monitoring Interpreting lab values - A1C, lipid, urine microalbumina.    Acute complications Taught prevention, symptoms, and  treatment of hypoglycemia - the 15 rule.    Chronic complications Relationship between chronic complications and blood glucose control;Assessed and discussed foot care and prevention of foot problems;Dental care;Retinopathy and reason for yearly dilated eye exams    Diabetes Stress and Support Helped patient identify a support system for diabetes management      Outcomes   Expected Outcomes Demonstrated interest in learning. Expect positive outcomes    Future DMSE 4-6 wks             Individualized Plan for Diabetes Self-Management Training:   Learning Objective:  Patient will have a greater understanding of diabetes self-management. Patient education plan is to attend individual and/or group sessions per assessed needs and concerns.   Other Intervention Notes: Patient is interested in checking blood sugars at home. We will discuss this next visit.  Main nutrition issues are frequent skipped meals and sugar sweetened beverages.  Patient voices readiness to work on these changes.   Plan:   Patient Instructions  Plan to eat a meal or snack every 3-5 hours during the day. Set alarms if needed to remind yourself, and keep healthy options convenient. Drink mostly sugar free drinks; try Nature's twist sugar free, Healthy Balance or Ocean Spray Diet 5 juice.  Start a few minutes of exercise most days, like a 10 minute walk, or some chair exercises. Gradually increase time as energy increases.  Choose light bologna, fat free or light cream cheese for easy protein foods.   Expected Outcomes:  Demonstrated interest in learning. Expect positive outcomes  Education material provided: [employer] nutrition guidelines for diabetes. Plate planner. Sample menus. Smart snacking.  If problems  or questions, patient to contact team via:  Phone  Future DSME appointment: 4-6 wks

## 2023-09-23 NOTE — Patient Instructions (Signed)
Plan to eat a meal or snack every 3-5 hours during the day. Set alarms if needed to remind yourself, and keep healthy options convenient. Drink mostly sugar free drinks; try Nature's twist sugar free, Healthy Balance or Ocean Spray Diet 5 juice.  Start a few minutes of exercise most days, like a 10 minute walk, or some chair exercises. Gradually increase time as energy increases.  Choose light bologna, fat free or light cream cheese for easy protein foods.

## 2023-09-28 ENCOUNTER — Other Ambulatory Visit: Payer: Self-pay | Admitting: Family Medicine

## 2023-09-28 DIAGNOSIS — J4520 Mild intermittent asthma, uncomplicated: Secondary | ICD-10-CM

## 2023-09-28 DIAGNOSIS — I10 Essential (primary) hypertension: Secondary | ICD-10-CM

## 2023-09-28 DIAGNOSIS — G620 Drug-induced polyneuropathy: Secondary | ICD-10-CM

## 2023-10-14 DIAGNOSIS — E876 Hypokalemia: Secondary | ICD-10-CM | POA: Diagnosis not present

## 2023-10-14 DIAGNOSIS — E785 Hyperlipidemia, unspecified: Secondary | ICD-10-CM | POA: Diagnosis not present

## 2023-10-14 DIAGNOSIS — Z7984 Long term (current) use of oral hypoglycemic drugs: Secondary | ICD-10-CM | POA: Diagnosis not present

## 2023-10-14 DIAGNOSIS — F325 Major depressive disorder, single episode, in full remission: Secondary | ICD-10-CM | POA: Diagnosis not present

## 2023-10-14 DIAGNOSIS — E1142 Type 2 diabetes mellitus with diabetic polyneuropathy: Secondary | ICD-10-CM | POA: Diagnosis not present

## 2023-10-14 DIAGNOSIS — Z791 Long term (current) use of non-steroidal anti-inflammatories (NSAID): Secondary | ICD-10-CM | POA: Diagnosis not present

## 2023-10-14 DIAGNOSIS — M1990 Unspecified osteoarthritis, unspecified site: Secondary | ICD-10-CM | POA: Diagnosis not present

## 2023-10-14 DIAGNOSIS — K219 Gastro-esophageal reflux disease without esophagitis: Secondary | ICD-10-CM | POA: Diagnosis not present

## 2023-10-14 DIAGNOSIS — Z8673 Personal history of transient ischemic attack (TIA), and cerebral infarction without residual deficits: Secondary | ICD-10-CM | POA: Diagnosis not present

## 2023-10-14 DIAGNOSIS — B372 Candidiasis of skin and nail: Secondary | ICD-10-CM | POA: Diagnosis not present

## 2023-10-14 DIAGNOSIS — J45909 Unspecified asthma, uncomplicated: Secondary | ICD-10-CM | POA: Diagnosis not present

## 2023-10-14 DIAGNOSIS — I10 Essential (primary) hypertension: Secondary | ICD-10-CM | POA: Diagnosis not present

## 2023-10-26 ENCOUNTER — Telehealth: Payer: Self-pay

## 2023-10-26 NOTE — Patient Outreach (Signed)
Care Management   Outreach Note  10/26/2023 Name: Molly [name] MRN: [id number] DOB: 1951/06/03  An unsuccessful telephone outreach was attempted today to contact the patient about Care Management needs.     Follow Up Plan:  A HIPAA compliant phone message was left for the patient providing contact information and requesting a return call.    Katina [facility]  [facility], [facility] RN Care Manager Direct Dial: [phone] Website: [email or web address]

## 2023-11-02 ENCOUNTER — Encounter: Payer: Medicare HMO | Attending: Family Medicine | Admitting: Dietician

## 2023-11-02 ENCOUNTER — Encounter: Payer: Self-pay | Admitting: Dietician

## 2023-11-02 VITALS — Ht 59.0 in | Wt 232.4 lb

## 2023-11-02 DIAGNOSIS — E1159 Type 2 diabetes mellitus with other circulatory complications: Secondary | ICD-10-CM | POA: Insufficient documentation

## 2023-11-02 DIAGNOSIS — E119 Type 2 diabetes mellitus without complications: Secondary | ICD-10-CM | POA: Diagnosis not present

## 2023-11-02 DIAGNOSIS — Z713 Dietary counseling and surveillance: Secondary | ICD-10-CM | POA: Insufficient documentation

## 2023-11-02 DIAGNOSIS — I152 Hypertension secondary to endocrine disorders: Secondary | ICD-10-CM | POA: Diagnosis not present

## 2023-11-02 NOTE — Patient Instructions (Addendum)
Try Ocean Spray Diet 5 cranberry juice drink; or at least limit to 4oz or less of regular juice and add water to dilute it. (Diluting does not reduce sugar but increases the amount of fluid without increasing sugar) OK to have a protein drink or breakfast drink if not hungry for a meal (not more than once daily). Try Glucerna, Premier, [place], or OWYN brands which are not high in sugar. With any protein drink, keep the sugar less than 10grams. Check blood sugars 2x a day, first thing in morning before eating and activity, and again about 2 hours after supper. Bring this record to doctor appointments.  Call doctor for prescription for softclix lancets and Accu chek Guide strips.

## 2023-11-02 NOTE — Progress Notes (Signed)
Diabetes Self-Management Education  Visit Type: Follow-up  Appt. Start Time: 1100 Appt. End Time: 1200  11/02/2023  [name], identified by name and date of birth, is a 72 y.o. female with a diagnosis of Diabetes: Type 2.   ASSESSMENT  Height 4\' 11"  (1.499 m), weight 232 lb 6.4 oz (105.4 kg). Body mass index is 46.94 kg/m.   Diabetes Self-Management Education - 11/02/23 1106       Visit Information   Visit Type Follow-up      Initial Visit   Diabetes Type Type 2      Complications   How often do you check your blood sugar? 0 times/day (not testing)    Have you had a dilated eye exam in the past 12 months? Yes   08/2023   Have you had a dental exam in the past 12 months? No    Are you checking your feet? Yes    How many days per week are you checking your feet? 7      Dietary Intake   Breakfast often skips due to lack of hunger    Snack (morning) occasionally popcorn to    Lunch 12pm eggs, fried bologna or sausage or bacon, maybe 1 slice bread at home, occ with biscuit (when out)    Snack (afternoon) occasional popcorn    Dinner usually chicken + potatoes/rice + green beans/ occ burger patty + potatoes    Snack (evening) none    Beverage(s) water; cranberry/ cranapple juice, lemonade made with sugar with extra water occ tea when out or soda starry/ sprite/ coke      Activity / Exercise   Activity / Exercise Type ADL's   increased ADLs recently, more walking, some stretching     Patient Education   Healthy Eating Reviewed blood glucose goals for pre and post meals and how to evaluate the patients' food intake on their blood glucose level.;Meal timing in regards to the patients' current diabetes medication.;Other (comment)   limiting sugar sweetened beverages; inportance of eating at regular intervals   Being Active Role of exercise on diabetes management, blood pressure control and cardiac health.    Monitoring Taught/evaluated SMBG meter.;Purpose and frequency of  SMBG.;Taught/discussed recording of test results and interpretation of SMBG.    Acute complications Taught prevention, symptoms, and  treatment of hypoglycemia - the 15 rule.      Outcomes   Expected Outcomes Demonstrated interest in learning. Expect positive outcomes    Future DMSE 2 months             Individualized Plan for Diabetes Self-Management Training:   Learning Objective:  Patient will have a greater understanding of diabetes self-management. Patient education plan is to attend individual and/or group sessions per assessed needs and concerns.   Plan:   Patient Instructions  Try Ocean Spray Diet 5 cranberry juice drink; or at least limit to 4oz or less of regular juice and add water to dilute it. (Diluting does not reduce sugar but increases the amount of fluid without increasing sugar) OK to have a protein drink or breakfast drink if not hungry for a meal (not more than once daily). Try Glucerna, Premier, [place], or OWYN brands which are not high in sugar. With any protein drink, keep the sugar less than 10grams. Check blood sugars 2x a day, first thing in morning before eating and activity, and again about 2 hours after supper. Bring this record to doctor appointments.  Call doctor for prescription for softclix  lancets and Accu chek Guide strips.   Expected Outcomes:  Demonstrated interest in learning. Expect positive outcomes  Education material provided: ADA - How to Thrive: A Guide for Your Journey with Diabetes, blood sugar record sheet x2  If problems or questions, patient to contact team via:  Phone  Future DSME appointment: 2 months

## 2023-11-03 ENCOUNTER — Other Ambulatory Visit: Payer: Self-pay

## 2023-11-05 ENCOUNTER — Other Ambulatory Visit: Payer: Self-pay | Admitting: Family Medicine

## 2023-11-05 DIAGNOSIS — R600 Localized edema: Secondary | ICD-10-CM

## 2023-11-05 NOTE — Patient Outreach (Signed)
  Care Management   Visit Note   Name: Molly [name] MRN: [id number] DOB: Jan 08, 1951  Subjective: Molly [name] is a 72 y.o. year old female who is a primary care patient of Alba Cory, MD. The Care Management team was consulted for assistance.      Brief outreach with [name] regarding care management needs. Anticipates being available to complete outreach on 11/09/23. Denies urgent concerns or care management needs. Agreed to call or contact clinic if she requires assistance prior to scheduled outreach.   PLAN Will follow up on 11/09/23.   Katina Degree [facility]  [facility], [facility] RN Care Manager Direct Dial: [phone] Website: [email or web address]

## 2023-11-09 ENCOUNTER — Other Ambulatory Visit: Payer: Self-pay

## 2023-11-16 NOTE — Patient Outreach (Signed)
  Care Management   Visit Note   Name: Molly [name] MRN: [id number] DOB: May 13, 1951  Subjective: Molly [name] is a 72 y.o. year old female who is a primary care patient of Molly Cory, MD. The Care Management team was consulted for assistance.      Engaged with patient via telephone.  Assessment:  Outpatient Encounter Medications as of 11/09/2023  Medication Sig   albuterol (VENTOLIN HFA) 108 (90 Base) MCG/ACT inhaler Inhale 2 puffs into the lungs every 4 (four) hours as needed for wheezing or shortness of breath.   clopidogrel (PLAVIX) 75 MG tablet Take 75 mg by mouth daily.   dapagliflozin propanediol (FARXIGA) 10 MG TABS tablet Take 1 tablet (10 mg total) by mouth daily before breakfast.   diclofenac Sodium (VOLTAREN) 1 % GEL Apply 4 grams to the skin 4 times daily. Need appt.   DULoxetine (CYMBALTA) 60 MG capsule Take 1 capsule (60 mg total) by mouth daily.   Evolocumab (REPATHA SURECLICK) 140 MG/ML SOAJ Inject 140mg  into THE SKIN every 14 DAYS   fluticasone furoate-vilanterol (BREO ELLIPTA) 100-25 MCG/ACT AEPB INHALE ONE (1) PUFF BY MOUTH ONCE DAILY   furosemide (LASIX) 40 MG tablet TAKE 1 TABLET BY MOUTH ONCE DAILY AS NEEDED   olmesartan-hydrochlorothiazide (BENICAR HCT) 20-12.5 MG tablet TAKE 1 TABLET BY MOUTH EVERY MORNING   pantoprazole (PROTONIX) 20 MG tablet TAKE 1 TABLET BY MOUTH ONCE DAILY   potassium chloride SA (KLOR-CON M) 20 MEQ tablet TAKE 1 TABLET BY MOUTH TWICE DAILY   pregabalin (LYRICA) 100 MG capsule TAKE 1 CAPSULE BY MOUTH EACH MORNING AND TAKE 2 CAPSULES IN THE EVENING   acetaminophen (TYLENOL) 500 MG tablet Take 2 tablets (1,000 mg total) by mouth every 6 (six) hours as needed for mild pain or moderate pain. (Patient not taking: Reported on 09/23/2023)   Calcium Carb-Cholecalciferol (CALCIUM 1000 + D PO) Take by mouth.   cetirizine (ZYRTEC ALLERGY) 10 MG tablet Take 1 tablet (10 mg total) by mouth daily. (Patient not taking: Reported on 09/23/2023)    ketoconazole (NIZORAL) 2 % cream APPLY TO THE AFFECTED AREA(S) of abdominal fold ONCE daily AS NEEDED   ofloxacin (OCUFLOX) 0.3 % ophthalmic solution Place into the left eye. (Patient not taking: Reported on 09/23/2023)   prednisoLONE acetate (PRED FORTE) 1 % ophthalmic suspension Place into the left eye. (Patient not taking: Reported on 11/09/2023)   No facility-administered encounter medications on file as of 11/09/2023.

## 2023-11-20 ENCOUNTER — Other Ambulatory Visit: Payer: Self-pay | Admitting: Nurse Practitioner

## 2023-11-20 DIAGNOSIS — G620 Drug-induced polyneuropathy: Secondary | ICD-10-CM

## 2023-11-20 DIAGNOSIS — J4520 Mild intermittent asthma, uncomplicated: Secondary | ICD-10-CM

## 2023-11-23 NOTE — Telephone Encounter (Signed)
Requested medication (s) are due for refill today: yes  Requested medication (s) are on the active medication list: yes  Last refill:  09/29/23 #180/0  Future visit scheduled: yes  Notes to clinic:  Unable to refill per protocol, cannot delegate.    Requested Prescriptions  Pending Prescriptions Disp Refills   pregabalin (LYRICA) 100 MG capsule [Pharmacy Med Name: PREGABALIN 100 MG CAPS 100 Capsule] 90 capsule 0    Sig: TAKE 1 CAPSULE BY MOUTH EACH MORNING AND TAKE 2 CAPSULES IN THE EVENING     Not Delegated - Neurology:  Anticonvulsants - Controlled - pregabalin Failed - 11/20/2023  7:05 PM      Failed - This refill cannot be delegated      Passed - Cr in normal range and within 360 days    Creat  Date Value Ref Range Status  11/12/2022 1.07 (H) 0.60 - 1.00 mg/dL Final   Creatinine, Ser  Date Value Ref Range Status  03/23/2023 0.85 0.44 - 1.00 mg/dL Final   Creatinine, Urine  Date Value Ref Range Status  08/11/2023 81 20 - 275 mg/dL Final         Passed - Completed PHQ-2 or PHQ-9 in the last 360 days      Passed - Valid encounter within last 12 months    Recent Outpatient Visits           3 months ago Hypertension associated with type 2 diabetes mellitus (HCC)   [place] [facility] Alba Cory, MD   7 months ago Dyslipidemia   [facility] Alba Cory, MD   7 months ago Community acquired pneumonia of right lung, unspecified part of lung   [facility] [place] Alba Cory, MD   8 months ago RSV (acute bronchiolitis due to respiratory syncytial virus)   [facility] Margarita Mail, DO   8 months ago Cough in adult   [facility] Alba Cory, MD       Future Appointments             In 3 weeks Alba Cory, MD [facility]            Signed Prescriptions Disp Refills   BREO ELLIPTA 100-25  MCG/ACT AEPB 60 each 10    Sig: INHALE 1 PUFF BY MOUTH ONCE DAILY     Pulmonology:  Combination Products Passed - 11/20/2023  7:05 PM      Passed - Valid encounter within last 12 months    Recent Outpatient Visits           3 months ago Hypertension associated with type 2 diabetes mellitus [facility])   [place] Alba Cory, MD   7 months ago Dyslipidemia   [facility] Alba Cory, MD   7 months ago Community acquired pneumonia of right lung, unspecified part of lung   [facility] Alba Cory, MD   8 months ago RSV (acute bronchiolitis due to respiratory syncytial virus)   [facility] Margarita Mail, DO   8 months ago Cough in adult   [facility] Alba Cory, MD       Future Appointments             In 3 weeks Alba Cory, MD [facility], [facility]

## 2023-11-23 NOTE — Telephone Encounter (Signed)
Requested Prescriptions  Pending Prescriptions Disp Refills   pregabalin (LYRICA) 100 MG capsule [Pharmacy Med Name: PREGABALIN 100 MG CAPS 100 Capsule] 90 capsule 0    Sig: TAKE 1 CAPSULE BY MOUTH EACH MORNING AND TAKE 2 CAPSULES IN THE EVENING     Not Delegated - Neurology:  Anticonvulsants - Controlled - pregabalin Failed - 11/20/2023  7:05 PM      Failed - This refill cannot be delegated      Passed - Cr in normal range and within 360 days    Creat  Date Value Ref Range Status  11/12/2022 1.07 (H) 0.60 - 1.00 mg/dL Final   Creatinine, Ser  Date Value Ref Range Status  03/23/2023 0.85 0.44 - 1.00 mg/dL Final   Creatinine, Urine  Date Value Ref Range Status  08/11/2023 81 20 - 275 mg/dL Final         Passed - Completed PHQ-2 or PHQ-9 in the last 360 days      Passed - Valid encounter within last 12 months    Recent Outpatient Visits           3 months ago Hypertension associated with type 2 diabetes mellitus (HCC)   [place] [facility] Alba Cory, MD   7 months ago Dyslipidemia   [facility], [facility]   7 months ago Community acquired pneumonia of right lung, unspecified part of lung   [facility] Alba Cory, MD   8 months ago RSV (acute bronchiolitis due to respiratory syncytial virus)   [facility] Margarita Mail, DO   8 months ago Cough in adult   [facility] Alba Cory, MD       Future Appointments             In 3 weeks Alba Cory, MD [facility]             BREO ELLIPTA 100-25 MCG/ACT AEPB [Pharmacy Med Name: BREO ELLIPTA 100-25 MCG INH 100-25 Aerosol] 60 each 10    Sig: INHALE 1 PUFF BY MOUTH ONCE DAILY     Pulmonology:  Combination Products Passed - 11/20/2023  7:05 PM      Passed - Valid encounter within last 12 months    Recent Outpatient Visits            3 months ago Hypertension associated with type 2 diabetes mellitus [facility])    [facility] Alba Cory, MD   7 months ago Dyslipidemia   [facility] Alba Cory, [facility]   7 months ago Community acquired pneumonia of right lung, unspecified part of lung   [facility] Alba Cory, MD   8 months ago RSV (acute bronchiolitis due to respiratory syncytial virus)   [facility] Margarita Mail, DO   8 months ago Cough in adult   [facility] Alba Cory, MD       Future Appointments             In 3 weeks Alba Cory, MD [facility]

## 2023-12-03 NOTE — Progress Notes (Signed)
Name: Molly [name]   MRN: [id number]    DOB: 28-Apr-1951   Date:12/14/2023       Progress Note  Subjective  Chief Complaint  Chief Complaint  Patient presents with   Medical Management of Chronic Issues    HPI  Discussed the use of AI scribe software for clinical note transcription with the patient, who gave verbal consent to proceed.  History of Present Illness   The patient, with a history of type 2 diabetes, hypertension, stroke, endometrial cancer, neuropathy, obesity, atherosclerosis of the aorta, and cardiomyopathy, presented for a regular follow-up. She reported persistent neuropathy, primarily in the feet, with occasional stabbing pain in the big toe. The pain was less intense than previously experienced.  The patient's diabetes was managed with [place] and diet, with a recent HbA1c of 6.7, slightly increased from 6.5 in August. She denied symptoms of uncontrolled diabetes such as excessive hunger, thirst, or frequent urination. Her diet included honey nut Cheerios for breakfast and she was mindful of including protein with bread intake. She reported late-night hunger, often eating a meal around 9 PM.  The patient's cardiomyopathy was managed with Lasix and potassium. She had an echocardiogram years ago, but the results were not available. She was not currently seeing a cardiologist. The patient's asthma was mild and intermittent, managed with Breo.  The patient's peripheral neuropathy was controlled with Lyrica. She also had a history of major depression, currently in remission, managed with Cymbalta. She was taking Repatha for high cholesterol due to intolerance to statins and Zetia. She is not sure if currently taking Plavix - she will verify at home and let us know Started for history of CVA   The patient had struggled with obesity for a long time. Her weight had increased by 6 pounds in the last four months, from 229 to 235. She had previously taken Victoza for weight  management but had to discontinue due to pancreatitis. She was considering changes to her health insurance which might allow for gym access.  The patient was also on pantoprazole for reflux, which was well controlled. She was checking her blood sugar levels daily and required more testing strips and lancets. She reported no significant swelling in the lower extremities.         Patient Active Problem List   Diagnosis Date Noted   Hypertension associated with type 2 diabetes mellitus (HCC) 08/11/2023   GERD without esophagitis 08/11/2023   Major depression in remission (HCC) 04/23/2021   Morbid obesity (HCC) 04/23/2021   Peripheral neuropathy due to chemotherapy (HCC) 04/23/2021   Myalgia 03/20/2021   Iron deficiency anemia 11/15/2018   Endometrial adenocarcinoma (HCC) 10/29/2018   Umbilical hernia without obstruction and without gangrene    Atherosclerosis of right coronary artery 11/10/2017   Atherosclerosis of abdominal aorta (HCC) 11/10/2017   Prediabetes 10/02/2017   Perennial allergic rhinitis with seasonal variation 05/14/2016   Asthma, mild intermittent, well-controlled 05/14/2016   Hypertension goal BP (blood pressure) < 140/90 12/10/2015   Cardiomyopathy due to hypertension (HCC) 12/10/2015   History of CVA (cerebrovascular accident) without residual deficits 12/10/2015   Hyperlipidemia LDL goal <70 12/10/2015   Statin intolerance 12/10/2015    Past Surgical History:  Procedure Laterality Date   ABDOMINAL HYSTERECTOMY     CHOLECYSTECTOMY     COLONOSCOPY WITH PROPOFOL N/A 01/08/2017   Procedure: COLONOSCOPY WITH PROPOFOL;  Surgeon: Wyline Mood, MD;  Location: [facility] ENDOSCOPY;  Service: Endoscopy;  Laterality: N/A;   COLONOSCOPY WITH PROPOFOL N/A 05/05/2018  Procedure: COLONOSCOPY WITH PROPOFOL;  Surgeon: Wyline Mood, MD;  Location: [facility] ENDOSCOPY;  Service: Gastroenterology;  Laterality: N/A;   COLONOSCOPY WITH PROPOFOL N/A 07/15/2021   Procedure: COLONOSCOPY WITH PROPOFOL;   Surgeon: Wyline Mood, MD;  Location: [facility] ENDOSCOPY;  Service: Gastroenterology;  Laterality: N/A;   HERNIA REPAIR  10/2017   umbilical   PORTACATH PLACEMENT N/A 11/04/2018   Procedure: INSERTION PORT-A-CATH;  Surgeon: Leafy Ro, MD;  Location: [facility] ORS;  Service: General;  Laterality: N/A;   SENTINEL NODE BIOPSY N/A 10/06/2018   Procedure: SENTINEL NODE BIOPSY;  Surgeon: Leida Lauth, MD;  Location: [facility] ORS;  Service: Gynecology;  Laterality: N/A;   UMBILICAL HERNIA REPAIR N/A 11/17/2017   Procedure: HERNIA REPAIR UMBILICAL ADULT;  Surgeon: Leafy Ro, MD;  Location: [facility] ORS;  Service: General;  Laterality: N/A;    Family History  Problem Relation Age of Onset   Stroke Mother    Hypertension Mother    Dementia Mother    Alzheimer's disease Mother    Gout Father    Asthma Father    Hypertension Father    Dementia Father    Healthy Sister    Stroke Brother    Alzheimer's disease Brother    Healthy Daughter    Hypertension Brother    Healthy Brother    Cancer Paternal Grandmother    Healthy Sister    Healthy Sister    Hypertension Sister    Hypertension Sister    Stroke Brother    Alzheimer's disease Brother    Stroke Brother    Hypertension Brother    Stroke Brother    Hypertension Brother    Healthy Brother    Healthy Brother    Hypertension Daughter    Breast cancer Neg Hx     Social History   Tobacco Use   Smoking status: Never   Smokeless tobacco: Never   Tobacco comments:    smoking cessation materials not required  Substance Use Topics   Alcohol use: No    Alcohol/week: 0.0 standard drinks of alcohol     Current Outpatient Medications:    albuterol (VENTOLIN HFA) 108 (90 Base) MCG/ACT inhaler, Inhale 2 puffs into the lungs every 4 (four) hours as needed for wheezing or shortness of breath., Disp: 8.5 each, Rfl: 11   BREO ELLIPTA 100-25 MCG/ACT AEPB, INHALE 1 PUFF BY MOUTH ONCE DAILY, Disp: 60 each, Rfl: 10   Calcium Carb-Cholecalciferol  (CALCIUM 1000 + D PO), Take by mouth., Disp: , Rfl:    clopidogrel (PLAVIX) 75 MG tablet, Take 75 mg by mouth daily., Disp: , Rfl:    dapagliflozin propanediol (FARXIGA) 10 MG TABS tablet, Take 1 tablet (10 mg total) by mouth daily before breakfast., Disp: 90 tablet, Rfl: 1   diclofenac Sodium (VOLTAREN) 1 % GEL, Apply 4 grams to the skin 4 times daily. Need appt., Disp: 300 g, Rfl: 0   furosemide (LASIX) 40 MG tablet, TAKE 1 TABLET BY MOUTH ONCE DAILY AS NEEDED, Disp: 30 tablet, Rfl: 0   ketoconazole (NIZORAL) 2 % cream, APPLY TO THE AFFECTED AREA(S) of abdominal fold ONCE daily AS NEEDED, Disp: 60 g, Rfl: 0   olmesartan-hydrochlorothiazide (BENICAR HCT) 20-12.5 MG tablet, TAKE 1 TABLET BY MOUTH EVERY MORNING, Disp: 90 tablet, Rfl: 1   potassium chloride SA (KLOR-CON M) 20 MEQ tablet, TAKE 1 TABLET BY MOUTH TWICE DAILY, Disp: 60 tablet, Rfl: 0   acetaminophen (TYLENOL) 500 MG tablet, Take 2 tablets (1,000 mg total) by mouth every 6 (  six) hours as needed for mild pain or moderate pain. (Patient not taking: Reported on 09/23/2023), Disp: 30 tablet, Rfl: 0   cetirizine (ZYRTEC ALLERGY) 10 MG tablet, Take 1 tablet (10 mg total) by mouth daily. (Patient not taking: Reported on 12/14/2023), Disp: 30 tablet, Rfl: 1   DULoxetine (CYMBALTA) 60 MG capsule, Take 1 capsule (60 mg total) by mouth daily., Disp: 30 capsule, Rfl: 3   Evolocumab (REPATHA SURECLICK) 140 MG/ML SOAJ, Inject 140 mg into the skin every 14 (fourteen) days., Disp: 2 mL, Rfl: 3   ofloxacin (OCUFLOX) 0.3 % ophthalmic solution, Place into the left eye. (Patient not taking: Reported on 12/14/2023), Disp: , Rfl:    pantoprazole (PROTONIX) 20 MG tablet, Take 1 tablet (20 mg total) by mouth daily., Disp: 30 tablet, Rfl: 3   pregabalin (LYRICA) 100 MG capsule, Take 1 capsule (100 mg total) by mouth at bedtime., Disp: 30 capsule, Rfl: 3  Allergies  Allergen Reactions   Ferumoxytol Anaphylaxis   Liraglutide Other (See Comments)    pancreatitis    Nsaids Hives, Rash and Nausea And Vomiting   Clopidogrel Other (See Comments)    Reports intolerance and states causes myalgias   Statins Other (See Comments) and Nausea And Vomiting    Joint pains   Oxycodone Nausea And Vomiting   Crestor [Rosuvastatin Calcium] Rash   Zetia [Ezetimibe] Other (See Comments)    Muscle weakness and joint pain    I personally reviewed active problem list, medication list, allergies, family history, social history with the patient/caregiver today.   ROS  Ten systems reviewed and is negative except as mentioned in HPI    Objective  Vitals:   12/14/23 1119  BP: 134/82  Pulse: 84  Resp: 16  Temp: 97.9 F (36.6 C)  TempSrc: Oral  Weight: 235 lb 4.8 oz (106.7 kg)  Height: 4\' 11"  (1.499 m)    Body mass index is 47.52 kg/m.  Physical Exam  Constitutional: Patient appears well-developed and well-nourished. Obese  No distress.  HEENT: head atraumatic, normocephalic, pupils equal and reactive to light, neck supple Cardiovascular: Normal rate, regular rhythm and normal heart sounds.  No murmur heard. Trace  BLE edema. Pulmonary/Chest: Effort normal and breath sounds normal. No respiratory distress. Abdominal: Soft.  There is no tenderness. Psychiatric: Patient has a normal mood and affect. behavior is normal. Judgment and thought content normal.   Recent Results (from the past 2160 hours)  POCT glycosylated hemoglobin (Hb A1C)     Status: Abnormal   Collection Time: 12/14/23 11:23 AM  Result Value Ref Range   Hemoglobin A1C 6.7 (A) 4.0 - 5.6 %   HbA1c POC (<> result, manual entry)     HbA1c, POC (prediabetic range)     HbA1c, POC (controlled diabetic range)      Diabetic Foot Exam: Diabetic Foot Exam - Simple   Simple Foot Form Visual Inspection See comments: Yes Sensation Testing Intact to touch and monofilament testing bilaterally: Yes Pulse Check Posterior Tibialis and Dorsalis pulse intact bilaterally: Yes Comments Thick toenails        PHQ2/9:    12/14/2023   11:15 AM 09/23/2023    9:25 AM 09/18/2023    3:55 PM 08/11/2023   11:41 AM 04/13/2023    3:00 PM  Depression screen PHQ 2/9  Decreased Interest 0 0 0 0 0  Down, Depressed, Hopeless 0 0 0 0 0  PHQ - 2 Score 0 0 0 0 0  Altered sleeping    0  3  Tired, decreased energy    0 0  Change in appetite    0 3  Feeling bad or failure about yourself     0 0  Trouble concentrating    0 0  Moving slowly or fidgety/restless    0 0  Suicidal thoughts    0 0  PHQ-9 Score    0 6    phq 9 is negative   Fall Risk:    12/14/2023   11:08 AM 09/23/2023    9:24 AM 09/18/2023    3:50 PM 08/11/2023   11:41 AM 04/13/2023    3:00 PM  Fall Risk   Falls in the past year? 0 0 0 0 1  Number falls in past yr: 0  0  1  Injury with Fall? 0  0  1  Risk for fall due to : No Fall Risks  No Fall Risks No Fall Risks Impaired mobility;Impaired balance/gait  Follow up Falls prevention discussed;Education provided;Falls evaluation completed  Falls prevention discussed;Education provided Falls prevention discussed Falls prevention discussed    Assessment & Plan  Assessment and Plan    Type 2 Diabetes Mellitus with dyslipidemia, HTN A1c increased slightly from 6.5 to 6.7. No symptoms of uncontrolled diabetes. Managed with diet and Farxiga. -Continue Farxiga. -Encourage mindful eating during the holiday season. -Check blood sugar 2 hours postprandial to monitor for spikes. -Continue monitoring blood sugar daily.  Cardiomyopathy due to Hypertension No recent echocardiogram on file. No current symptoms of heart failure or atrial fibrillation. -Order echocardiogram at [facility] to confirm diagnosis and assess current cardiac function.  Peripheral Neuropathy - secondary to chemotherapy for endometrial cancer Pain in feet, particularly big toe, described as stabbing but less intense than previously. Managed with Lyrica. -Continue Lyrica 100mg  at night. -Performed foot exam, no  significant findings.  Morbid Obesity Recent weight gain of 6 pounds. Previous history of pancreatitis with Victoza. -Encourage portion control and reduction in carbohydrate intake. -Consider exercise program with [employer]'s [employer] program once patient switches insurance in January.  Hyperlipidemia Managed with Repatha due to statin intolerance. -Continue Repatha every two weeks.  Depression ( Major Depression - going on for years) In remission, managed with Cymbalta. -Continue Cymbalta.  Asthma Mild intermittent, managed with Breo. -Continue Breo. -Provide 26-month supply of all medications.  Atherosclerosis of the Aorta On Plavix ( possibly)  for history of stroke and Repatha -Confirm patient is taking Plavix despite previous note indicating intolerance. -Continue Plavix if tolerated.  GERD Controlled with Pantoprazole. -Continue Pantoprazole 20mg  daily.  Follow-up in 4 months.

## 2023-12-07 ENCOUNTER — Other Ambulatory Visit: Payer: Self-pay | Admitting: Family Medicine

## 2023-12-07 DIAGNOSIS — I10 Essential (primary) hypertension: Secondary | ICD-10-CM

## 2023-12-07 DIAGNOSIS — I119 Hypertensive heart disease without heart failure: Secondary | ICD-10-CM

## 2023-12-07 DIAGNOSIS — R600 Localized edema: Secondary | ICD-10-CM

## 2023-12-07 DIAGNOSIS — Z8673 Personal history of transient ischemic attack (TIA), and cerebral infarction without residual deficits: Secondary | ICD-10-CM

## 2023-12-07 NOTE — Telephone Encounter (Signed)
Has appt 12/16

## 2023-12-14 ENCOUNTER — Ambulatory Visit (INDEPENDENT_AMBULATORY_CARE_PROVIDER_SITE_OTHER): Payer: Medicare HMO | Admitting: Family Medicine

## 2023-12-14 ENCOUNTER — Encounter: Payer: Self-pay | Admitting: Family Medicine

## 2023-12-14 VITALS — BP 134/82 | HR 84 | Temp 97.9°F | Resp 16 | Ht 59.0 in | Wt 235.3 lb

## 2023-12-14 DIAGNOSIS — J4520 Mild intermittent asthma, uncomplicated: Secondary | ICD-10-CM

## 2023-12-14 DIAGNOSIS — G620 Drug-induced polyneuropathy: Secondary | ICD-10-CM

## 2023-12-14 DIAGNOSIS — I43 Cardiomyopathy in diseases classified elsewhere: Secondary | ICD-10-CM

## 2023-12-14 DIAGNOSIS — Z8673 Personal history of transient ischemic attack (TIA), and cerebral infarction without residual deficits: Secondary | ICD-10-CM | POA: Diagnosis not present

## 2023-12-14 DIAGNOSIS — I119 Hypertensive heart disease without heart failure: Secondary | ICD-10-CM | POA: Diagnosis not present

## 2023-12-14 DIAGNOSIS — E1159 Type 2 diabetes mellitus with other circulatory complications: Secondary | ICD-10-CM | POA: Diagnosis not present

## 2023-12-14 DIAGNOSIS — G720 Drug-induced myopathy: Secondary | ICD-10-CM | POA: Diagnosis not present

## 2023-12-14 DIAGNOSIS — F325 Major depressive disorder, single episode, in full remission: Secondary | ICD-10-CM

## 2023-12-14 DIAGNOSIS — I700 Atherosclerosis of aorta: Secondary | ICD-10-CM

## 2023-12-14 DIAGNOSIS — Z8542 Personal history of malignant neoplasm of other parts of uterus: Secondary | ICD-10-CM

## 2023-12-14 DIAGNOSIS — I152 Hypertension secondary to endocrine disorders: Secondary | ICD-10-CM

## 2023-12-14 LAB — POCT GLYCOSYLATED HEMOGLOBIN (HGB A1C): Hemoglobin A1C: 6.7 % — AB (ref 4.0–5.6)

## 2023-12-14 MED ORDER — REPATHA SURECLICK 140 MG/ML ~~LOC~~ SOAJ: 140.0000 mg | SUBCUTANEOUS | 3 refills | Status: DC

## 2023-12-14 MED ORDER — PANTOPRAZOLE SODIUM 20 MG PO TBEC: 20.0000 mg | DELAYED_RELEASE_TABLET | Freq: Every day | ORAL | 3 refills | Status: DC

## 2023-12-14 MED ORDER — DULOXETINE HCL 60 MG PO CPEP: 60.0000 mg | ORAL_CAPSULE | Freq: Every day | ORAL | 3 refills | Status: DC

## 2023-12-14 MED ORDER — PREGABALIN 100 MG PO CAPS: 100.0000 mg | ORAL_CAPSULE | Freq: Every day | ORAL | 3 refills | Status: DC

## 2023-12-15 LAB — COMPLETE METABOLIC PANEL WITH GFR
AG Ratio: 1.4 (calc) (ref 1.0–2.5)
ALT: 11 U/L (ref 6–29)
AST: 18 U/L (ref 10–35)
Albumin: 4.1 g/dL (ref 3.6–5.1)
Alkaline phosphatase (APISO): 90 U/L (ref 37–153)
BUN: 10 mg/dL (ref 7–25)
CO2: 29 mmol/L (ref 20–32)
Calcium: 10 mg/dL (ref 8.6–10.4)
Chloride: 101 mmol/L (ref 98–110)
Creat: 0.88 mg/dL (ref 0.60–1.00)
Globulin: 2.9 g/dL (ref 1.9–3.7)
Glucose, Bld: 101 mg/dL — ABNORMAL HIGH (ref 65–99)
Potassium: 4 mmol/L (ref 3.5–5.3)
Sodium: 141 mmol/L (ref 135–146)
Total Bilirubin: 0.4 mg/dL (ref 0.2–1.2)
Total Protein: 7 g/dL (ref 6.1–8.1)
eGFR: 70 mL/min/{1.73_m2} (ref >=60)

## 2023-12-15 LAB — CBC WITH DIFFERENTIAL/PLATELET
Absolute Lymphocytes: 1934 {cells}/uL (ref 850–3900)
Absolute Monocytes: 482 {cells}/uL (ref 200–950)
Basophils Absolute: 20 {cells}/uL (ref 0–200)
Basophils Relative: 0.3 %
Eosinophils Absolute: 53 {cells}/uL (ref 15–500)
Eosinophils Relative: 0.8 %
HCT: 37.9 % (ref 35.0–45.0)
Hemoglobin: 12.4 g/dL (ref 11.7–15.5)
MCH: 30 pg (ref 27.0–33.0)
MCHC: 32.7 g/dL (ref 32.0–36.0)
MCV: 91.8 fL (ref 80.0–100.0)
MPV: 10.2 fL (ref 7.5–12.5)
Monocytes Relative: 7.3 %
Neutro Abs: 4112 {cells}/uL (ref 1500–7800)
Neutrophils Relative %: 62.3 %
Platelets: 334 10*3/uL (ref 140–400)
RBC: 4.13 10*6/uL (ref 3.80–5.10)
RDW: 12.9 % (ref 11.0–15.0)
Total Lymphocyte: 29.3 %
WBC: 6.6 10*3/uL (ref 3.8–10.8)

## 2023-12-15 LAB — LIPID PANEL
Cholesterol: 147 mg/dL (ref <200)
HDL: 65 mg/dL (ref >=50)
LDL Cholesterol (Calc): 64 mg/dL
Non-HDL Cholesterol (Calc): 82 mg/dL (ref <130)
Total CHOL/HDL Ratio: 2.3 (calc) (ref <5.0)
Triglycerides: 101 mg/dL (ref <150)

## 2023-12-21 ENCOUNTER — Telehealth: Payer: Self-pay

## 2023-12-21 ENCOUNTER — Other Ambulatory Visit: Payer: Self-pay

## 2023-12-21 NOTE — Patient Outreach (Signed)
  Care Management   Outreach Note  12/21/2023 Name: Molly [name] MRN: [id number] DOB: November 26, 1951  An unsuccessful outreach attempt was made today for a scheduled Care Management visit.    Follow Up Plan:  A HIPAA compliant phone message was left for the patient providing contact information and requesting a return call.    [facility]  [facility] RN Care Manager Direct Dial: [phone] Website: [email or web address]

## 2023-12-31 ENCOUNTER — Other Ambulatory Visit: Payer: Self-pay | Admitting: Family Medicine

## 2023-12-31 DIAGNOSIS — I10 Essential (primary) hypertension: Secondary | ICD-10-CM

## 2023-12-31 DIAGNOSIS — I119 Hypertensive heart disease without heart failure: Secondary | ICD-10-CM

## 2023-12-31 DIAGNOSIS — R600 Localized edema: Secondary | ICD-10-CM

## 2024-01-04 ENCOUNTER — Ambulatory Visit: Payer: Medicare HMO | Admitting: Dietician

## 2024-01-05 ENCOUNTER — Other Ambulatory Visit: Payer: Self-pay

## 2024-01-11 ENCOUNTER — Ambulatory Visit
Admission: RE | Admit: 2024-01-11 | Discharge: 2024-01-11 | Disposition: A | Discharge status: Home / Self Care | Payer: 59 | Source: Ambulatory Visit | Attending: Family Medicine | Admitting: Family Medicine

## 2024-01-11 ENCOUNTER — Encounter: Payer: Self-pay | Admitting: Dietician

## 2024-01-11 ENCOUNTER — Encounter: Payer: 59 | Attending: Family Medicine | Admitting: Dietician

## 2024-01-11 VITALS — Wt 235.0 lb

## 2024-01-11 DIAGNOSIS — I43 Cardiomyopathy in diseases classified elsewhere: Secondary | ICD-10-CM | POA: Insufficient documentation

## 2024-01-11 DIAGNOSIS — I119 Hypertensive heart disease without heart failure: Secondary | ICD-10-CM | POA: Diagnosis not present

## 2024-01-11 DIAGNOSIS — I428 Other cardiomyopathies: Secondary | ICD-10-CM | POA: Insufficient documentation

## 2024-01-11 DIAGNOSIS — I10 Essential (primary) hypertension: Secondary | ICD-10-CM | POA: Insufficient documentation

## 2024-01-11 DIAGNOSIS — I152 Hypertension secondary to endocrine disorders: Secondary | ICD-10-CM | POA: Insufficient documentation

## 2024-01-11 DIAGNOSIS — E119 Type 2 diabetes mellitus without complications: Secondary | ICD-10-CM | POA: Diagnosis present

## 2024-01-11 DIAGNOSIS — E1159 Type 2 diabetes mellitus with other circulatory complications: Secondary | ICD-10-CM | POA: Diagnosis present

## 2024-01-11 LAB — ECHOCARDIOGRAM COMPLETE
AR max vel: 2.51 cm2
AV Area VTI: 2.9 cm2
AV Area mean vel: 2.62 cm2
AV Mean grad: 2 mm[Hg]
AV Peak grad: 4.6 mm[Hg]
Ao pk vel: 1.07 m/s
Area-P 1/2: 3.53 cm2
MV VTI: 2 cm2
S' Lateral: 2.8 cm

## 2024-01-11 NOTE — Progress Notes (Signed)
*  PRELIMINARY RESULTS* Echocardiogram 2D Echocardiogram has been performed.  Molly [name] 01/11/2024, 12:02 PM

## 2024-01-11 NOTE — Patient Instructions (Addendum)
 Eat a meal or snack every 3-5 hours during the day. Set alarms if needed as reminders to eat.  OK to eat a small amount -- at least have something with protein + one other food, such as a fruit or veggie or a starch Examples of simple meals/ snacks: fruit + low fat (2% or light) cheese, peanut butter and bread or graham crackers; tuna or chicken salad on rye bread Try mixing 1 cup Greek vanilla yogurt with 1/3 cup peanut butter and 2 teaspoons honey. Counts as protein. Keep in a container in the fridge and use as a dip for fruit or graham crackers; raw carrots or other veggies with plain yogurt mixed with ranch seasoning, or hummus dip.  Use leftover chicken to make simple chicken salad with light mayo and chopped pickle and/or onion. Try healthy congealed salad -- 1 small box sugar free orange jello + 1 cup low fat cottage cheese + 1 small container cool whip topping: mix them together, then add drained chopped mandarin oranges and a small can of crushed pineapple. Keep in fridge.  This counts as fruit and protein.  If not 100% whole grain bread, try some rye bread or traditional sourdough bread, they are slightly better for blood sugar than white bread. Try Oatmeal Squares cereal (small bowl)

## 2024-01-11 NOTE — Progress Notes (Signed)
 Diabetes Self-Management Education  Visit Type:  Follow-up  Appt. Start Time: 1400 Appt. End Time: 1500  01/11/2024  Molly [name], identified by name and date of birth, is a 73 y.o. female with a diagnosis of Diabetes:  .   ASSESSMENT  Weight 235 lb (106.6 kg). Body mass index is 47.46 kg/m.    Diabetes Self-Management Education - 01/11/24 1800       Complications   How often do you check your blood sugar? 0 times/day (not testing)    Have you had a dilated eye exam in the past 12 months? Yes    Have you had a dental exam in the past 12 months? No    Are you checking your feet? Yes    How many days per week are you checking your feet? 7      Dietary Intake   Breakfast protein shake; honey nut cheerios; often skips    Lunch egg and bacon/ sausage/ bologna    Dinner sandwich with bologna or turkey; popcorn; occasionally cooks baked chicken + potato + green beans/ orther veg    Snack (evening) none    Beverage(s) water, cranberry juice      Activity / Exercise   Activity / Exercise Type ADL's      Patient Education   Healthy Eating Meal timing in regards to the patients' current diabetes medication.;Meal options for control of blood glucose level and chronic complications.    Being Active Role of exercise on diabetes management, blood pressure control and cardiac health.             Learning Objective:  Patient will have a greater understanding of diabetes self-management. Patient education plan is to attend individual and/or group sessions per assessed needs and concerns.  Other Intervention Notes: Patient reports poor appetite, often does not want to eat or cook. Discussion focused on simple options for protein + carbs that would be easy to eat.  Patient is experiencing higher level of stress due to her own health + having live-in guest with late stage cancer.  Plan:   Patient Instructions  Eat a meal or snack every 3-5 hours during the day. Set alarms if  needed as reminders to eat.  OK to eat a small amount -- at least have something with protein + one other food, such as a fruit or veggie or a starch Examples of simple meals/ snacks: fruit + low fat (2% or light) cheese, peanut butter and bread or graham crackers; tuna or chicken salad on rye bread Try mixing 1 cup Greek vanilla yogurt with 1/3 cup peanut butter and 2 teaspoons honey. Counts as protein. Keep in a container in the fridge and use as a dip for fruit or graham crackers; raw carrots or other veggies with plain yogurt mixed with ranch seasoning, or hummus dip.  Use leftover chicken to make simple chicken salad with light mayo and chopped pickle and/or onion. Try healthy congealed salad -- 1 small box sugar free orange jello + 1 cup low fat cottage cheese + 1 small container cool whip topping: mix them together, then add drained chopped mandarin oranges and a small can of crushed pineapple. Keep in fridge.  This counts as fruit and protein.  If not 100% whole grain bread, try some rye bread or traditional sourdough bread, they are slightly better for blood sugar than white bread. Try Oatmeal Squares cereal (small bowl)    Expected Outcomes:  Demonstrated interest in learning. Expect positive outcomes  Education material provided:   If problems or questions, patient to contact team via:  Phone  Future DSME appointment: - 2 months

## 2024-01-11 NOTE — Patient Outreach (Signed)
  Care Management   Visit Note   Name: Molly [name] MRN: [id number] DOB: September 17, 1951  Subjective: Molly [name] is a 73 y.o. year old female who is a primary care patient of Sowles, Krichna, MD. The Care Management team was consulted for assistance.      Engaged with Molly [name] via telephone  Assessment:  Outpatient Encounter Medications as of 01/05/2024  Medication Sig   cetirizine  (ZYRTEC  ALLERGY) 10 MG tablet Take 1 tablet (10 mg total) by mouth daily.   acetaminophen  (TYLENOL ) 500 MG tablet Take 2 tablets (1,000 mg total) by mouth every 6 (six) hours as needed for mild pain or moderate pain. (Patient not taking: Reported on 09/23/2023)   albuterol  (VENTOLIN  HFA) 108 (90 Base) MCG/ACT inhaler Inhale 2 puffs into the lungs every 4 (four) hours as needed for wheezing or shortness of breath.   BREO ELLIPTA  100-25 MCG/ACT AEPB INHALE 1 PUFF BY MOUTH ONCE DAILY   Calcium  Carb-Cholecalciferol (CALCIUM  1000 + D PO) Take by mouth.   clopidogrel  (PLAVIX ) 75 MG tablet Take 75 mg by mouth daily.   dapagliflozin  propanediol (FARXIGA ) 10 MG TABS tablet Take 1 tablet (10 mg total) by mouth daily before breakfast.   diclofenac  Sodium (VOLTAREN ) 1 % GEL Apply 4 grams to the skin 4 times daily. Need appt.   DULoxetine  (CYMBALTA ) 60 MG capsule Take 1 capsule (60 mg total) by mouth daily.   Evolocumab  (REPATHA  SURECLICK) 140 MG/ML SOAJ Inject 140 mg into the skin every 14 (fourteen) days.   furosemide  (LASIX ) 40 MG tablet TAKE 1 TABLET BY MOUTH ONCE DAILY AS NEEDED   ketoconazole  (NIZORAL ) 2 % cream APPLY TO THE AFFECTED AREA(S) of abdominal fold ONCE daily AS NEEDED   ofloxacin (OCUFLOX) 0.3 % ophthalmic solution Place into the left eye. (Patient not taking: Reported on 09/23/2023)   olmesartan -hydrochlorothiazide  (BENICAR  HCT) 20-12.5 MG tablet TAKE 1 TABLET BY MOUTH EVERY MORNING   pantoprazole  (PROTONIX ) 20 MG tablet Take 1 tablet (20 mg total) by mouth daily.   potassium chloride  SA (KLOR-CON  M) 20  MEQ tablet TAKE ONE (1) TABLET BY MOUTH TWICE DAILY   pregabalin  (LYRICA ) 100 MG capsule Take 1 capsule (100 mg total) by mouth at bedtime.   No facility-administered encounter medications on file as of 01/05/2024.

## 2024-01-19 ENCOUNTER — Other Ambulatory Visit: Payer: Self-pay

## 2024-01-19 ENCOUNTER — Ambulatory Visit: Payer: 59 | Admitting: Family Medicine

## 2024-01-19 ENCOUNTER — Encounter: Payer: Self-pay | Admitting: Family Medicine

## 2024-01-19 VITALS — BP 128/80 | HR 101 | Temp 98.2°F | Resp 18 | Ht 59.0 in | Wt 228.8 lb

## 2024-01-19 DIAGNOSIS — E785 Hyperlipidemia, unspecified: Secondary | ICD-10-CM

## 2024-01-19 DIAGNOSIS — B9689 Other specified bacterial agents as the cause of diseases classified elsewhere: Secondary | ICD-10-CM

## 2024-01-19 DIAGNOSIS — J329 Chronic sinusitis, unspecified: Secondary | ICD-10-CM | POA: Diagnosis not present

## 2024-01-19 DIAGNOSIS — J4531 Mild persistent asthma with (acute) exacerbation: Secondary | ICD-10-CM | POA: Diagnosis not present

## 2024-01-19 DIAGNOSIS — E1169 Type 2 diabetes mellitus with other specified complication: Secondary | ICD-10-CM | POA: Diagnosis not present

## 2024-01-19 MED ORDER — AMOXICILLIN-POT CLAVULANATE 875-125 MG PO TABS: 1.0000 | ORAL_TABLET | Freq: Two times a day (BID) | ORAL | 0 refills | Status: DC

## 2024-01-19 MED ORDER — BENZONATATE 100 MG PO CAPS: 100.0000 mg | ORAL_CAPSULE | Freq: Three times a day (TID) | ORAL | 0 refills | Status: DC | PRN

## 2024-01-19 NOTE — Progress Notes (Addendum)
Name: Molly [name]   MRN: [id number]    DOB: 02-21-1951   Date:01/19/2024       Progress Note  Subjective  Chief Complaint  Chief Complaint  Patient presents with   Cough    Onset since last Thursday   Nasal Congestion    mucus   Wheezing    History of Present Illness      Discussed the use of AI scribe software for clinical note transcription with the patient, who gave verbal consent to proceed.  History of Present Illness   The patient, with a history of asthma, diabetes, and previous pneumonia, began experiencing symptoms on Thursday. She reported the onset of symptoms with a headache, specifically pain behind the eyes and upwards. This was followed by sneezing, coughing, and significant mucus production. By Sunday, the patient's condition had worsened, leading to a day spent primarily in bed. Despite the use of Breo and Albuterol, the patient reported persistent coughing and a sensation of drainage from the upper respiratory tract.  The patient's asthma appears to have been exacerbated by the current illness, with reported shortness of breath and wheezing. Over-the-counter treatments, including Theraflu and Emergen-C, were used without significant relief. The patient also reported a loss of appetite, consuming minimal food over the weekend, and only a small amount of soup.  The patient's diabetes management during this illness is unclear, as she did not check her blood sugar levels on the day of the consultation. She also reported profuse sweating since the morning of the consultation. The patient's cough was described as originating from the drainage, which was initially from the upper respiratory tract but seemed to have moved lower by the morning of the consultation.  The patient's history of pneumonia and current symptoms of a sinus infection complicated by asthma were noted.         Patient Active Problem List   Diagnosis Date Noted   Hypertension associated with type 2  diabetes mellitus (HCC) 08/11/2023   GERD without esophagitis 08/11/2023   Major depression in remission (HCC) 04/23/2021   Morbid obesity (HCC) 04/23/2021   Peripheral neuropathy due to chemotherapy (HCC) 04/23/2021   Myalgia 03/20/2021   Iron deficiency anemia 11/15/2018   Endometrial adenocarcinoma (HCC) 10/29/2018   Umbilical hernia without obstruction and without gangrene    Atherosclerosis of right coronary artery 11/10/2017   Atherosclerosis of abdominal aorta (HCC) 11/10/2017   Prediabetes 10/02/2017   Perennial allergic rhinitis with seasonal variation 05/14/2016   Asthma, mild intermittent, well-controlled 05/14/2016   Hypertension goal BP (blood pressure) < 140/90 12/10/2015   Cardiomyopathy due to hypertension (HCC) 12/10/2015   History of CVA (cerebrovascular accident) without residual deficits 12/10/2015   Hyperlipidemia LDL goal <70 12/10/2015   Statin intolerance 12/10/2015    Social History   Tobacco Use   Smoking status: Never   Smokeless tobacco: Never   Tobacco comments:    smoking cessation materials not required  Substance Use Topics   Alcohol use: No    Alcohol/week: 0.0 standard drinks of alcohol     Current Outpatient Medications:    albuterol (VENTOLIN HFA) 108 (90 Base) MCG/ACT inhaler, Inhale 2 puffs into the lungs every 4 (four) hours as needed for wheezing or shortness of breath., Disp: 8.5 each, Rfl: 11   BREO ELLIPTA 100-25 MCG/ACT AEPB, INHALE 1 PUFF BY MOUTH ONCE DAILY, Disp: 60 each, Rfl: 10   Calcium Carb-Cholecalciferol (CALCIUM 1000 + D PO), Take by mouth., Disp: , Rfl:    cetirizine (  ZYRTEC ALLERGY) 10 MG tablet, Take 1 tablet (10 mg total) by mouth daily., Disp: 30 tablet, Rfl: 1   clopidogrel (PLAVIX) 75 MG tablet, Take 75 mg by mouth daily., Disp: , Rfl:    dapagliflozin propanediol (FARXIGA) 10 MG TABS tablet, Take 1 tablet (10 mg total) by mouth daily before breakfast., Disp: 90 tablet, Rfl: 1   diclofenac Sodium (VOLTAREN) 1 % GEL,  Apply 4 grams to the skin 4 times daily. Need appt., Disp: 300 g, Rfl: 0   DULoxetine (CYMBALTA) 60 MG capsule, Take 1 capsule (60 mg total) by mouth daily., Disp: 30 capsule, Rfl: 3   Evolocumab (REPATHA SURECLICK) 140 MG/ML SOAJ, Inject 140 mg into the skin every 14 (fourteen) days., Disp: 2 mL, Rfl: 3   furosemide (LASIX) 40 MG tablet, TAKE 1 TABLET BY MOUTH ONCE DAILY AS NEEDED, Disp: 30 tablet, Rfl: 2   ketoconazole (NIZORAL) 2 % cream, APPLY TO THE AFFECTED AREA(S) of abdominal fold ONCE daily AS NEEDED, Disp: 60 g, Rfl: 0   olmesartan-hydrochlorothiazide (BENICAR HCT) 20-12.5 MG tablet, TAKE 1 TABLET BY MOUTH EVERY MORNING, Disp: 90 tablet, Rfl: 1   pantoprazole (PROTONIX) 20 MG tablet, Take 1 tablet (20 mg total) by mouth daily., Disp: 30 tablet, Rfl: 3   potassium chloride SA (KLOR-CON M) 20 MEQ tablet, TAKE ONE (1) TABLET BY MOUTH TWICE DAILY, Disp: 60 tablet, Rfl: 2   pregabalin (LYRICA) 100 MG capsule, Take 1 capsule (100 mg total) by mouth at bedtime., Disp: 30 capsule, Rfl: 3   acetaminophen (TYLENOL) 500 MG tablet, Take 2 tablets (1,000 mg total) by mouth every 6 (six) hours as needed for mild pain or moderate pain. (Patient not taking: Reported on 09/23/2023), Disp: 30 tablet, Rfl: 0   ofloxacin (OCUFLOX) 0.3 % ophthalmic solution, Place into the left eye. (Patient not taking: Reported on 01/19/2024), Disp: , Rfl:   Allergies  Allergen Reactions   Ferumoxytol Anaphylaxis   Liraglutide Other (See Comments)    pancreatitis   Nsaids Hives, Rash and Nausea And Vomiting   Clopidogrel Other (See Comments)    Reports intolerance and states causes myalgias   Statins Other (See Comments) and Nausea And Vomiting    Joint pains   Oxycodone Nausea And Vomiting   Crestor [Rosuvastatin Calcium] Rash   Zetia [Ezetimibe] Other (See Comments)    Muscle weakness and joint pain    ROS  Ten systems reviewed and is negative except as mentioned in HPI    Objective  Vitals:   01/19/24 1424   BP: 128/80  Pulse: (!) 101  Resp: 18  SpO2: 97%  Weight: 228 lb 12.8 oz (103.8 kg)  Height: 4\' 11"  (1.499 m)    Body mass index is 46.21 kg/m.    Physical Exam  Constitutional: Patient appears well-developed and well-nourished. Obese  No distress.  HEENT: head atraumatic, normocephalic, pupils equal and reactive to light, ears normal TM, neck supple, throat within normal limits Tender during palpation of frontal sinus  Cardiovascular: Normal rate, regular rhythm and normal heart sounds.  No murmur heard. No BLE edema. Pulmonary/Chest:In and expiratory wheezing.  Abdominal: Soft.  There is no tenderness. Psychiatric: Patient has a normal mood and affect. behavior is normal. Judgment and thought content normal.   Recent Results (from the past 2160 hours)  POCT glycosylated hemoglobin (Hb A1C)     Status: Abnormal   Collection Time: 12/14/23 11:23 AM  Result Value Ref Range   Hemoglobin A1C 6.7 (A) 4.0 - 5.6 %  HbA1c POC (<> result, manual entry)     HbA1c, POC (prediabetic range)     HbA1c, POC (controlled diabetic range)    Lipid panel     Status: None   Collection Time: 12/14/23 12:10 PM  Result Value Ref Range   Cholesterol 147 <200 mg/dL   HDL 65 > OR = 50 mg/dL   Triglycerides 161 <096 mg/dL   LDL Cholesterol (Calc) 64 mg/dL (calc)    Comment: Reference range: <100 . Desirable range <100 mg/dL for primary prevention;   <70 mg/dL for patients with CHD or diabetic patients  with > or = 2 CHD risk factors. Marland Kitchen LDL-C is now calculated using the Martin-Hopkins  calculation, which is a validated novel method providing  better accuracy than the Friedewald equation in the  estimation of LDL-C.  Horald Pollen et al. Lenox Ahr. 0454;098(11): 2061-2068  (http://education.[facility].com/faq/FAQ164)    Total CHOL/HDL Ratio 2.3 <5.0 (calc)   Non-HDL Cholesterol (Calc) 82 <914 mg/dL (calc)    Comment: For patients with diabetes plus 1 major ASCVD risk  factor, treating to a  non-HDL-C goal of <100 mg/dL  (LDL-C of <78 mg/dL) is considered a therapeutic  option.   CBC with Differential/Platelet     Status: None   Collection Time: 12/14/23 12:10 PM  Result Value Ref Range   WBC 6.6 3.8 - 10.8 Thousand/uL   RBC 4.13 3.80 - 5.10 Million/uL   Hemoglobin 12.4 11.7 - 15.5 g/dL   HCT 29.5 62.1 - 30.8 %   MCV 91.8 80.0 - 100.0 fL   MCH 30.0 27.0 - 33.0 pg   MCHC 32.7 32.0 - 36.0 g/dL    Comment: For adults, a slight decrease in the calculated MCHC value (in the range of 30 to 32 g/dL) is most likely not clinically significant; however, it should be interpreted with caution in correlation with other red cell parameters and the patient's clinical condition.    RDW 12.9 11.0 - 15.0 %   Platelets 334 140 - 400 Thousand/uL   MPV 10.2 7.5 - 12.5 fL   Neutro Abs 4,112 1,500 - 7,800 cells/uL   Absolute Lymphocytes 1,934 850 - 3,900 cells/uL   Absolute Monocytes 482 200 - 950 cells/uL   Eosinophils Absolute 53 15 - 500 cells/uL   Basophils Absolute 20 0 - 200 cells/uL   Neutrophils Relative % 62.3 %   Total Lymphocyte 29.3 %   Monocytes Relative 7.3 %   Eosinophils Relative 0.8 %   Basophils Relative 0.3 %  COMPLETE METABOLIC PANEL WITH GFR     Status: Abnormal   Collection Time: 12/14/23 12:10 PM  Result Value Ref Range   Glucose, Bld 101 (H) 65 - 99 mg/dL    Comment: .            Fasting reference interval . For someone without known diabetes, a glucose value between 100 and 125 mg/dL is consistent with prediabetes and should be confirmed with a follow-up test. .    BUN 10 7 - 25 mg/dL   Creat 6.57 8.46 - 9.62 mg/dL   eGFR 70 > OR = 60 XB/MWU/1.32G4   BUN/Creatinine Ratio SEE NOTE: 6 - 22 (calc)    Comment:    Not Reported: BUN and Creatinine are within    reference range. .    Sodium 141 135 - 146 mmol/L   Potassium 4.0 3.5 - 5.3 mmol/L   Chloride 101 98 - 110 mmol/L   CO2 29 20 - 32 mmol/L  Calcium 10.0 8.6 - 10.4 mg/dL   Total Protein 7.0  6.1 - 8.1 g/dL   Albumin 4.1 3.6 - 5.1 g/dL   Globulin 2.9 1.9 - 3.7 g/dL (calc)   AG Ratio 1.4 1.0 - 2.5 (calc)   Total Bilirubin 0.4 0.2 - 1.2 mg/dL   Alkaline phosphatase (APISO) 90 37 - 153 U/L   AST 18 10 - 35 U/L   ALT 11 6 - 29 U/L  ECHOCARDIOGRAM COMPLETE     Status: None   Collection Time: 01/11/24 12:02 PM  Result Value Ref Range   Ao pk vel 1.07 m/s   AV Area VTI 2.90 cm2   AR max vel 2.51 cm2   AV Mean grad 2.0 mmHg   AV Peak grad 4.6 mmHg   S' Lateral 2.80 cm   AV Area mean vel 2.62 cm2   Area-P 1/2 3.53 cm2   MV VTI 2.00 cm2   Est EF 50 - 55%      Assessment and Plan    Sinusitis/Bacterial  Symptoms of facial pain, sneezing, coughing, and mucus production. Likely secondary to sinus drainage triggering cough. -Start Augmentin for bacterial sinus infection. -Continue Mucinex for mucus production. -Use saline nasal spray for sinus relief.  Asthma exacerbation Increased shortness of breath and wheezing likely secondary to sinusitis and bronchitis. -Continue Breo and Albuterol. -tessalon perles  -Notify provider if asthma symptoms do not improve.  Diabetes type 2 with dyslipdiemia Last A1C was 6.7 in December. Current symptoms of decreased appetite and possible hyperglycemia due to illness. -Monitor blood glucose levels closely during illness. -Continue current diabetes management plan.  General Health Maintenance -Stay hydrated and maintain nutrition during illness. -Check temperature and use Tylenol for fever if needed. -Notify provider if symptoms do not improve within 48 hours. May need steroid orally

## 2024-01-19 NOTE — Patient Outreach (Signed)
  Care Management   Visit Note  01/19/2024 Name: Molly [name] MRN: [id number] DOB: 08-Jan-1951  Subjective: Molly [name] is a 73 y.o. year old female who is a primary care patient of Alba Cory, MD. The Care Management team was consulted for assistance.      Engaged with Ms. Sweigert via telephone.  Assessment:  Review of patient past medical history, allergies, medications, health status, including review of consultants reports, laboratory and other test data, was performed as part of  evaluation and provision of care management services.    Outpatient Encounter Medications as of 01/19/2024  Medication Sig   acetaminophen (TYLENOL) 500 MG tablet Take 2 tablets (1,000 mg total) by mouth every 6 (six) hours as needed for mild pain or moderate pain. (Patient not taking: Reported on 09/23/2023)   albuterol (VENTOLIN HFA) 108 (90 Base) MCG/ACT inhaler Inhale 2 puffs into the lungs every 4 (four) hours as needed for wheezing or shortness of breath.   BREO ELLIPTA 100-25 MCG/ACT AEPB INHALE 1 PUFF BY MOUTH ONCE DAILY   Calcium Carb-Cholecalciferol (CALCIUM 1000 + D PO) Take by mouth.   cetirizine (ZYRTEC ALLERGY) 10 MG tablet Take 1 tablet (10 mg total) by mouth daily.   clopidogrel (PLAVIX) 75 MG tablet Take 75 mg by mouth daily.   dapagliflozin propanediol (FARXIGA) 10 MG TABS tablet Take 1 tablet (10 mg total) by mouth daily before breakfast.   diclofenac Sodium (VOLTAREN) 1 % GEL Apply 4 grams to the skin 4 times daily. Need appt.   DULoxetine (CYMBALTA) 60 MG capsule Take 1 capsule (60 mg total) by mouth daily.   Evolocumab (REPATHA SURECLICK) 140 MG/ML SOAJ Inject 140 mg into the skin every 14 (fourteen) days.   furosemide (LASIX) 40 MG tablet TAKE 1 TABLET BY MOUTH ONCE DAILY AS NEEDED   ketoconazole (NIZORAL) 2 % cream APPLY TO THE AFFECTED AREA(S) of abdominal fold ONCE daily AS NEEDED   ofloxacin (OCUFLOX) 0.3 % ophthalmic solution Place into the left eye. (Patient not taking:  Reported on 09/23/2023)   olmesartan-hydrochlorothiazide (BENICAR HCT) 20-12.5 MG tablet TAKE 1 TABLET BY MOUTH EVERY MORNING   pantoprazole (PROTONIX) 20 MG tablet Take 1 tablet (20 mg total) by mouth daily.   potassium chloride SA (KLOR-CON M) 20 MEQ tablet TAKE ONE (1) TABLET BY MOUTH TWICE DAILY   pregabalin (LYRICA) 100 MG capsule Take 1 capsule (100 mg total) by mouth at bedtime.   No facility-administered encounter medications on file as of 01/19/2024.    Interventions:

## 2024-01-19 NOTE — Patient Instructions (Signed)
Thank you for allowing the Care Management team to participate in your care.  It was great speaking with you today!

## 2024-01-21 ENCOUNTER — Encounter: Payer: Self-pay | Admitting: Family Medicine

## 2024-01-22 ENCOUNTER — Other Ambulatory Visit: Payer: Self-pay

## 2024-01-22 ENCOUNTER — Other Ambulatory Visit: Payer: Self-pay | Admitting: Family Medicine

## 2024-01-22 DIAGNOSIS — J4531 Mild persistent asthma with (acute) exacerbation: Secondary | ICD-10-CM

## 2024-01-22 MED ORDER — PREDNISONE 10 MG (21) PO TBPK: ORAL_TABLET | ORAL | 0 refills | Status: DC

## 2024-02-01 ENCOUNTER — Other Ambulatory Visit: Payer: Self-pay | Admitting: Family Medicine

## 2024-02-01 DIAGNOSIS — I152 Hypertension secondary to endocrine disorders: Secondary | ICD-10-CM

## 2024-02-02 ENCOUNTER — Other Ambulatory Visit: Payer: Self-pay

## 2024-02-02 ENCOUNTER — Emergency Department: Payer: 59

## 2024-02-02 ENCOUNTER — Emergency Department
Admission: EM | Admit: 2024-02-02 | Discharge: 2024-02-03 | Disposition: A | Discharge status: Home / Self Care | Payer: 59 | Attending: Emergency Medicine | Admitting: Emergency Medicine

## 2024-02-02 DIAGNOSIS — Z8616 Personal history of COVID-19: Secondary | ICD-10-CM | POA: Insufficient documentation

## 2024-02-02 DIAGNOSIS — I10 Essential (primary) hypertension: Secondary | ICD-10-CM | POA: Insufficient documentation

## 2024-02-02 DIAGNOSIS — Z8542 Personal history of malignant neoplasm of other parts of uterus: Secondary | ICD-10-CM | POA: Diagnosis not present

## 2024-02-02 DIAGNOSIS — J45901 Unspecified asthma with (acute) exacerbation: Secondary | ICD-10-CM | POA: Diagnosis not present

## 2024-02-02 DIAGNOSIS — J4521 Mild intermittent asthma with (acute) exacerbation: Secondary | ICD-10-CM

## 2024-02-02 DIAGNOSIS — Z20822 Contact with and (suspected) exposure to covid-19: Secondary | ICD-10-CM | POA: Insufficient documentation

## 2024-02-02 DIAGNOSIS — Z7951 Long term (current) use of inhaled steroids: Secondary | ICD-10-CM | POA: Insufficient documentation

## 2024-02-02 DIAGNOSIS — R509 Fever, unspecified: Secondary | ICD-10-CM | POA: Diagnosis present

## 2024-02-02 DIAGNOSIS — J101 Influenza due to other identified influenza virus with other respiratory manifestations: Secondary | ICD-10-CM | POA: Diagnosis not present

## 2024-02-02 DIAGNOSIS — Z79899 Other long term (current) drug therapy: Secondary | ICD-10-CM | POA: Insufficient documentation

## 2024-02-02 LAB — CBC WITH DIFFERENTIAL/PLATELET
Abs Immature Granulocytes: 0.01 10*3/uL (ref 0.00–0.07)
Basophils Absolute: 0 10*3/uL (ref 0.0–0.1)
Basophils Relative: 1 %
Eosinophils Absolute: 0.1 10*3/uL (ref 0.0–0.5)
Eosinophils Relative: 1 %
HCT: 42.2 % (ref 36.0–46.0)
Hemoglobin: 13.8 g/dL (ref 12.0–15.0)
Immature Granulocytes: 0 %
Lymphocytes Relative: 36 %
Lymphs Abs: 1.4 10*3/uL (ref 0.7–4.0)
MCH: 30.5 pg (ref 26.0–34.0)
MCHC: 32.7 g/dL (ref 30.0–36.0)
MCV: 93.2 fL (ref 80.0–100.0)
Monocytes Absolute: 0.6 10*3/uL (ref 0.1–1.0)
Monocytes Relative: 15 %
Neutro Abs: 1.9 10*3/uL (ref 1.7–7.7)
Neutrophils Relative %: 47 %
Platelets: 278 10*3/uL (ref 150–400)
RBC: 4.53 MIL/uL (ref 3.87–5.11)
RDW: 13.5 % (ref 11.5–15.5)
WBC: 4 10*3/uL (ref 4.0–10.5)
nRBC: 0 % (ref 0.0–0.2)

## 2024-02-02 LAB — BASIC METABOLIC PANEL
Anion gap: 12 (ref 5–15)
BUN: 9 mg/dL (ref 8–23)
CO2: 28 mmol/L (ref 22–32)
Calcium: 9.9 mg/dL (ref 8.9–10.3)
Chloride: 99 mmol/L (ref 98–111)
Creatinine, Ser: 0.8 mg/dL (ref 0.44–1.00)
GFR, Estimated: >60 mL/min (ref >60)
Glucose, Bld: 116 mg/dL — ABNORMAL HIGH (ref 70–99)
Potassium: 3.8 mmol/L (ref 3.5–5.1)
Sodium: 139 mmol/L (ref 135–145)

## 2024-02-02 LAB — RESP PANEL BY RT-PCR (RSV, FLU A&B, COVID)  RVPGX2
Influenza A by PCR: POSITIVE — AB
Influenza B by PCR: NEGATIVE
Resp Syncytial Virus by PCR: NEGATIVE
SARS Coronavirus 2 by RT PCR: NEGATIVE

## 2024-02-02 LAB — TROPONIN I (HIGH SENSITIVITY): Troponin I (High Sensitivity): 6 ng/L (ref <18)

## 2024-02-02 MED ORDER — IPRATROPIUM-ALBUTEROL 0.5-2.5 (3) MG/3ML IN SOLN
3.0000 mL | Freq: Once | RESPIRATORY_TRACT | Status: AC
  Administered 2024-02-02: 3 mL via RESPIRATORY_TRACT
  Filled 2024-02-02: qty 3

## 2024-02-02 NOTE — Telephone Encounter (Signed)
 Mail order request- little early- will allow  Requested Prescriptions  Pending Prescriptions Disp Refills   dapagliflozin  propanediol (FARXIGA ) 10 MG TABS tablet [Pharmacy Med Name: FARXIGA  10 MG TABLET 10 Tablet] 30 tablet     Sig: TAKE 1 TABLET BY MOUTH DAILY BEFORE BREAKFAST     Endocrinology:  Diabetes - SGLT2 Inhibitors Passed - 02/02/2024  2:45 PM      Passed - Cr in normal range and within 360 days    Creat  Date Value Ref Range Status  12/14/2023 0.88 0.60 - 1.00 mg/dL Final   Creatinine, Urine  Date Value Ref Range Status  08/11/2023 81 20 - 275 mg/dL Final         Passed - HBA1C is between 0 and 7.9 and within 180 days    Hemoglobin A1C  Date Value Ref Range Status  12/14/2023 6.7 (A) 4.0 - 5.6 % Final   Hgb A1c MFr Bld  Date Value Ref Range Status  04/16/2022 6.2 (H) <5.7 % of total Hgb Final    Comment:    For someone without known diabetes, a hemoglobin  A1c value between 5.7% and 6.4% is consistent with prediabetes and should be confirmed with a  follow-up test. . For someone with known diabetes, a value <7% indicates that their diabetes is well controlled. A1c targets should be individualized based on duration of diabetes, age, comorbid conditions, and other considerations. . This assay result is consistent with an increased risk of diabetes. . Currently, no consensus exists regarding use of hemoglobin A1c for diagnosis of diabetes for children. .          Passed - eGFR in normal range and within 360 days    GFR, Est African American  Date Value Ref Range Status  11/07/2020 81 > OR = 60 mL/min/1.22m2 Final   GFR, Est Non African American  Date Value Ref Range Status  11/07/2020 70 > OR = 60 mL/min/1.36m2 Final   GFR, Estimated  Date Value Ref Range Status  03/23/2023 >60 >60 mL/min Final    Comment:    (NOTE) Calculated using the CKD-EPI Creatinine Equation (2021)    eGFR  Date Value Ref Range Status  12/14/2023 70 > OR = 60  mL/min/1.55m2 Final         Passed - Valid encounter within last 6 months    Recent Outpatient Visits           2 weeks ago Sinusitis, bacterial   [place] [facility] [place], Dorette, MD   1 month ago Hypertension associated with type 2 diabetes mellitus [facility])   [place] [facility] [place], Dorette, MD   5 months ago Hypertension associated with type 2 diabetes mellitus [facility])    [facility] [place], MD   9 months ago Dyslipidemia   [facility] [place] D[place], MD   10 months ago Community acquired pneumonia of right lung, unspecified part of lung   [facility] [place], MD       Future Appointments             Tomorrow Sowles, Krichna, MD [facility]   In 2 months Sowles, Krichna, MD [facility] [place], [facility]

## 2024-02-02 NOTE — ED Triage Notes (Signed)
 Pt to ED for SOB. States had URI 3 weeks ago, was on abx and prednisone . Has had residual cough. Hx asthma. Did albuterol  nebulizer at home PTA. Pt states has diuretics as needed but does not take them often. Has raspy cough.   PA examining pt in triage.

## 2024-02-02 NOTE — ED Provider Notes (Signed)
 [facility] Provider Note    Event Date/Time   First MD Initiated Contact with Patient 02/02/24 2355     (approximate)   History   Shortness of Breath   HPI  Molly [name] is a 73 y.o. female with history of asthma, cardiomyopathy, hypertension, hyperlipidemia who presents to the emergency department with fevers, cough, congestion, wheezing.  States she was treated for pneumonia 3 weeks ago.  Reports symptoms improved but then came back yesterday.   History provided by patient, family.    Past Medical History:  Diagnosis Date   Anxiety    Asthma    history of asthma   Cardiomyopathy (HCC)    Complication of anesthesia    tore hair out and made teeth rough   COVID-19 virus infection 09/08/2019   Depression    Endometrial cancer (HCC) 08/2018   GERD (gastroesophageal reflux disease)    takes prilosec prn   History of CVA (cerebrovascular accident) 12/10/2015   Hyperlipidemia LDL goal <70 12/10/2015   Hypertension    Prediabetes 10/02/2017   A1c 6 in January 2018   Stroke [facility]) 1990    no residual effects    Past Surgical History:  Procedure Laterality Date   ABDOMINAL HYSTERECTOMY     CHOLECYSTECTOMY     COLONOSCOPY WITH PROPOFOL  N/A 01/08/2017   Procedure: COLONOSCOPY WITH PROPOFOL ;  Surgeon: Ruel Kung, MD;  Location: [facility] ENDOSCOPY;  Service: Endoscopy;  Laterality: N/A;   COLONOSCOPY WITH PROPOFOL  N/A 05/05/2018   Procedure: COLONOSCOPY WITH PROPOFOL ;  Surgeon: Kung Ruel, MD;  Location: [facility] ENDOSCOPY;  Service: Gastroenterology;  Laterality: N/A;   COLONOSCOPY WITH PROPOFOL  N/A 07/15/2021   Procedure: COLONOSCOPY WITH PROPOFOL ;  Surgeon: Kung Ruel, MD;  Location: [facility] ENDOSCOPY;  Service: Gastroenterology;  Laterality: N/A;   HERNIA REPAIR  10/2017   umbilical   PORTACATH PLACEMENT N/A 11/04/2018   Procedure: INSERTION PORT-A-CATH;  Surgeon: Jordis Laneta FALCON, MD;  Location: [facility] ORS;  Service: General;  Laterality: N/A;   SENTINEL  NODE BIOPSY N/A 10/06/2018   Procedure: SENTINEL NODE BIOPSY;  Surgeon: Mancil Barter, MD;  Location: [facility] ORS;  Service: Gynecology;  Laterality: N/A;   UMBILICAL HERNIA REPAIR N/A 11/17/2017   Procedure: HERNIA REPAIR UMBILICAL ADULT;  Surgeon: Jordis Laneta FALCON, MD;  Location: [facility] ORS;  Service: General;  Laterality: N/A;    MEDICATIONS:  Prior to Admission medications   Medication Sig Start Date End Date Taking? Authorizing Provider  acetaminophen  (TYLENOL ) 500 MG tablet Take 2 tablets (1,000 mg total) by mouth every 6 (six) hours as needed for mild pain or moderate pain. Patient not taking: Reported on 09/23/2023 10/06/18   Connell Davies, MD  albuterol  (VENTOLIN  HFA) 108 [phone] Base) MCG/ACT inhaler Inhale 2 puffs into the lungs every 4 (four) hours as needed for wheezing or shortness of breath. 10/07/22   Sowles, Krichna, MD  amoxicillin -clavulanate (AUGMENTIN ) 875-125 MG tablet Take 1 tablet by mouth 2 (two) times daily. 01/19/24   Sowles, Krichna, MD  benzonatate  (TESSALON ) 100 MG capsule Take 1-2 capsules (100-200 mg total) by mouth 3 (three) times daily as needed. 01/19/24   Sowles, Krichna, MD  BREO ELLIPTA  100-25 MCG/ACT AEPB INHALE 1 PUFF BY MOUTH ONCE DAILY 11/23/23   Sowles, Krichna, MD  Calcium  Carb-Cholecalciferol (CALCIUM  1000 + D PO) Take by mouth.    [provider]  cetirizine  (ZYRTEC  ALLERGY) 10 MG tablet Take 1 tablet (10 mg total) by mouth daily. 12/03/21   Pender, Julie F, FNP  clopidogrel  (PLAVIX ) 75 MG tablet Take 75 mg by mouth daily.    [provider]  dapagliflozin  propanediol (FARXIGA ) 10 MG TABS tablet TAKE 1 TABLET BY MOUTH DAILY BEFORE BREAKFAST 02/02/24   Glenard, Krichna, MD  diclofenac  Sodium (VOLTAREN ) 1 % GEL Apply 4 grams to the skin 4 times daily. Need appt. 09/10/21   Sowles, Krichna, MD  DULoxetine  (CYMBALTA ) 60 MG capsule Take 1 capsule (60 mg total) by mouth daily. 12/14/23   Sowles, Krichna, MD  Evolocumab  (REPATHA  SURECLICK) 140 MG/ML SOAJ  Inject 140 mg into the skin every 14 (fourteen) days. 12/14/23   Sowles, Krichna, MD  furosemide  (LASIX ) 40 MG tablet TAKE 1 TABLET BY MOUTH ONCE DAILY AS NEEDED 01/01/24   Sowles, Krichna, MD  ketoconazole  (NIZORAL ) 2 % cream APPLY TO THE AFFECTED AREA(S) of abdominal fold ONCE daily AS NEEDED 01/06/22   Glenard, Krichna, MD  ofloxacin (OCUFLOX) 0.3 % ophthalmic solution Place into the left eye. Patient not taking: Reported on 01/19/2024 11/06/22   [provider]  olmesartan -hydrochlorothiazide  (BENICAR  HCT) 20-12.5 MG tablet TAKE 1 TABLET BY MOUTH EVERY MORNING 09/29/23   Pender, Julie F, FNP  pantoprazole  (PROTONIX ) 20 MG tablet Take 1 tablet (20 mg total) by mouth daily. 12/14/23   Sowles, Krichna, MD  potassium chloride  SA (KLOR-CON  M) 20 MEQ tablet TAKE ONE (1) TABLET BY MOUTH TWICE DAILY 01/01/24   Sowles, Krichna, MD  predniSONE  (STERAPRED UNI-PAK 21 TAB) 10 MG (21) TBPK tablet Use as directed. 01/22/24   Sowles, Krichna, MD  pregabalin  (LYRICA ) 100 MG capsule Take 1 capsule (100 mg total) by mouth at bedtime. 12/14/23   Glenard Mire, MD    Physical Exam   Triage Vital Signs: ED Triage Vitals  Encounter Vitals Group     BP 02/02/24 1846 104/68     Systolic BP Percentile --      Diastolic BP Percentile --      Pulse Rate 02/02/24 1846 86     Resp 02/02/24 1846 20     Temp 02/02/24 1846 99.4 F (37.4 C)     Temp Source 02/02/24 1846 Oral     SpO2 02/02/24 1846 94 %     Weight 02/02/24 1849 228 lb 9.9 oz (103.7 kg)     Height 02/02/24 1849 5' (1.524 m)     Head Circumference --      Peak Flow --      Pain Score --      Pain Loc --      Pain Education --      Exclude from Growth Chart --     Most recent vital signs: Vitals:   02/02/24 2135 02/03/24 0140  BP: (!) 162/69   Pulse: 90   Resp: 18   Temp: 99.9 F (37.7 C)   SpO2: 94% 96%    CONSTITUTIONAL: Alert, responds appropriately to questions. Well-appearing; well-nourished HEAD: Normocephalic, atraumatic EYES:  Conjunctivae clear, pupils appear equal, sclera nonicteric ENT: normal nose; moist mucous membranes NECK: Supple, normal ROM CARD: RRR; S1 and S2 appreciated RESP: Normal chest excursion without splinting or tachypnea; breath sounds equal bilaterally, no rhonchi, no rales, no hypoxia or respiratory distress, speaking full sentences, diminished aeration at bases bilaterally with some scattered expiratory wheezes, dry cough ABD/GI: Non-distended; soft, non-tender, no rebound, no guarding, no peritoneal signs BACK: The back appears normal EXT: Normal ROM in all joints; no deformity noted, no edema SKIN: Normal color for age and race; warm; no rash on exposed skin NEURO:  Moves all extremities equally, normal speech PSYCH: The patient's mood and manner are appropriate.   ED Results / Procedures / Treatments   LABS: (all labs ordered are listed, but only abnormal results are displayed) Labs Reviewed  RESP PANEL BY RT-PCR (RSV, FLU A&B, COVID)  RVPGX2 - Abnormal; Notable for the following components:      Result Value   Influenza A by PCR POSITIVE (*)    All other components within normal limits  BASIC METABOLIC PANEL - Abnormal; Notable for the following components:   Glucose, Bld 116 (*)    All other components within normal limits  CBC WITH DIFFERENTIAL/PLATELET  TROPONIN I (HIGH SENSITIVITY)     EKG:  EKG Interpretation Date/Time:  Tuesday February 02 2024 18:56:39 EST Ventricular Rate:  79 PR Interval:  144 QRS Duration:  70 QT Interval:  366 QTC Calculation: 419 R Axis:   -15  Text Interpretation: Normal sinus rhythm Cannot rule out Anterior infarct , age undetermined Abnormal ECG When compared with ECG of 15-Mar-2023 21:05, QRS axis Shifted right Confirmed by Neomi Neptune [phone]) on 02/03/2024 4:52:27 AM         RADIOLOGY: My personal review and interpretation of imaging: Chest x-ray shows no pneumonia.  I have personally reviewed all radiology reports.   DG Chest 2  View Result Date: 02/02/2024 CLINICAL DATA:  Shortness of breath EXAM: CHEST - 2 VIEW COMPARISON:  03/15/2023 FINDINGS: The heart size and mediastinal contours are within normal limits. Both lungs are clear. The visualized skeletal structures are unremarkable. IMPRESSION: No active cardiopulmonary disease. Electronically Signed   By: Luke Bun M.D.   On: 02/02/2024 19:19     PROCEDURES:  Critical Care performed: No     Procedures    IMPRESSION / MDM / ASSESSMENT AND PLAN / ED COURSE  I reviewed the triage vital signs and the nursing notes.    Patient here with cough, congestion.  Intermittent wheezing.  History of asthma.    DIFFERENTIAL DIAGNOSIS (includes but not limited to):   Viral URI, recurrent pneumonia, asthma exacerbation, less likely ACS, CHF, PE, pneumothorax   Patient's presentation is most consistent with acute presentation with potential threat to life or bodily function.   PLAN: Workup initiated from triage.  EKG nonischemic.  Troponin negative.  Patient is not having any chest pain.  No leukocytosis.  Normal hemoglobin.  Normal electrolytes.  Chest x-ray reviewed and interpreted by myself and the radiologist and is clear.  Patient is positive for influenza A.  Negative for COVID and RSV.  Discussed risk and benefits of Tamiflu .  Patient declines this medication at this time.  She does have some scattered wheezing on exam.  Will give DuoNeb, prednisone , Tessalon  Perle and reassess.   MEDICATIONS GIVEN IN ED: Medications  ipratropium-albuterol  (DUONEB) 0.5-2.5 (3) MG/3ML nebulizer solution 3 mL (3 mLs Nebulization Given 02/02/24 2140)  ipratropium-albuterol  (DUONEB) 0.5-2.5 (3) MG/3ML nebulizer solution 3 mL (3 mLs Nebulization Given 02/03/24 0028)  predniSONE  (DELTASONE ) tablet 60 mg (60 mg Oral Given 02/03/24 0027)  benzonatate  (TESSALON ) capsule 100 mg (100 mg Oral Given 02/03/24 0027)     ED COURSE: Patient reports feeling better.  Cough is improved.  Lungs now  clear to auscultation.  No hypoxia at rest or with ambulation.  Patient was comfortable with plan for discharge home with prednisone  burst, albuterol  inhaler, Tessalon  Perles.  No indication for antibiotics at this time.  Patient reports she has enough albuterol  for her nebulizer machine at home.  She has a PCP for follow-up.   At this time, I do not feel there is any life-threatening condition present. I reviewed all nursing notes, vitals, pertinent previous records.  All lab and urine results, EKGs, imaging ordered have been independently reviewed and interpreted by myself.  I reviewed all available radiology reports from any imaging ordered this visit.  Based on my assessment, I feel the patient is safe to be discharged home without further emergent workup and can continue workup as an outpatient as needed. Discussed all findings, treatment plan as well as usual and customary return precautions.  They verbalize understanding and are comfortable with this plan.  Outpatient follow-up has been provided as needed.  All questions have been answered.    CONSULTS:  none   OUTSIDE RECORDS REVIEWED: Reviewed last PCP note on 01/19/2024.  Patient was on Augmentin  at that time for sinusitis, bronchitis.       FINAL CLINICAL IMPRESSION(S) / ED DIAGNOSES   Final diagnoses:  Influenza A  Exacerbation of intermittent asthma, unspecified asthma severity     Rx / DC Orders   ED Discharge Orders          Ordered    benzonatate  (TESSALON  PERLES) 100 MG capsule  3 times daily PRN        02/03/24 0156    predniSONE  (DELTASONE ) 20 MG tablet  Daily        02/03/24 0156    albuterol  (VENTOLIN  HFA) 108 (90 Base) MCG/ACT inhaler  Every 4 hours PRN        02/03/24 0156             Note:  This document was prepared using Dragon voice recognition software and may include unintentional dictation errors.   Wen Merced, Josette SAILOR, DO 02/03/24 [phone]

## 2024-02-02 NOTE — ED Provider Triage Note (Signed)
 Emergency Medicine Provider Triage Evaluation Note  Molly [name] , a 74 y.o. female  was evaluated in triage.  Pt complains of shortness of breath in the last 3 weeks, wheezing.  She did use albuterol  before coming here today.  Patient states history of pneumonia treated with antibiotics and steroids a few weeks ago  Review of Systems  Positive:  Negative:   Physical Exam  BP 104/68   Pulse 86   Temp 99.4 F (37.4 C) (Oral)   Resp 20   Ht 5' (1.524 m)   Wt 103.7 kg   SpO2 94%   BMI 44.65 kg/m  Gen:   Awake, no distress   Resp:  Normal effort wheezing MSK:   Moves extremities without difficulty  Other:   Medical Decision Making  Medically screening exam initiated at 6:52 PM.  Appropriate orders placed.  Molly [name] was informed that the remainder of the evaluation will be completed by another provider, this initial triage assessment does not replace that evaluation, and the importance of remaining in the ED until their evaluation is complete.  Patient with upper respiratory infection ordered chest x-ray, CBC CMP EKG   Janit Kast, PA-C 02/02/24 1853

## 2024-02-03 ENCOUNTER — Ambulatory Visit: Payer: 59 | Admitting: Family Medicine

## 2024-02-03 ENCOUNTER — Encounter: Payer: Self-pay | Admitting: Family Medicine

## 2024-02-03 VITALS — BP 134/82 | HR 107 | Temp 98.4°F | Resp 18 | Ht 60.0 in | Wt 230.7 lb

## 2024-02-03 DIAGNOSIS — J101 Influenza due to other identified influenza virus with other respiratory manifestations: Secondary | ICD-10-CM | POA: Diagnosis not present

## 2024-02-03 DIAGNOSIS — J4531 Mild persistent asthma with (acute) exacerbation: Secondary | ICD-10-CM

## 2024-02-03 MED ORDER — BENZONATATE 100 MG PO CAPS
100.0000 mg | ORAL_CAPSULE | Freq: Once | ORAL | Status: AC
  Administered 2024-02-03: 100 mg via ORAL
  Filled 2024-02-03: qty 1

## 2024-02-03 MED ORDER — BENZONATATE 100 MG PO CAPS: 100.0000 mg | ORAL_CAPSULE | Freq: Three times a day (TID) | ORAL | 0 refills | Status: DC | PRN

## 2024-02-03 MED ORDER — PREDNISONE 20 MG PO TABS
60.0000 mg | ORAL_TABLET | Freq: Once | ORAL | Status: AC
  Administered 2024-02-03: 60 mg via ORAL
  Filled 2024-02-03: qty 3

## 2024-02-03 MED ORDER — PREDNISONE 20 MG PO TABS: 60.0000 mg | ORAL_TABLET | Freq: Every day | ORAL | 0 refills | Status: DC

## 2024-02-03 MED ORDER — IPRATROPIUM-ALBUTEROL 0.5-2.5 (3) MG/3ML IN SOLN
3.0000 mL | Freq: Once | RESPIRATORY_TRACT | Status: AC
  Administered 2024-02-03: 3 mL via RESPIRATORY_TRACT
  Filled 2024-02-03: qty 3

## 2024-02-03 MED ORDER — OSELTAMIVIR PHOSPHATE 75 MG PO CAPS: 75.0000 mg | ORAL_CAPSULE | Freq: Two times a day (BID) | ORAL | 0 refills | Status: DC

## 2024-02-03 MED ORDER — ALBUTEROL SULFATE HFA 108 (90 BASE) MCG/ACT IN AERS
2.0000 | INHALATION_SPRAY | RESPIRATORY_TRACT | 1 refills | Status: AC | PRN
Start: 2024-02-03 — End: ?

## 2024-02-03 NOTE — Progress Notes (Signed)
 Name: Molly [name]   MRN: [id number]    DOB: 1951-08-20   Date:02/03/2024       Progress Note  Subjective  Chief Complaint  Chief Complaint  Patient presents with   ER Follow Up    Dx with FLU A not feeling well, having wheezing and feeling lethargy    Discussed the use of AI scribe software for clinical note transcription with the patient, who gave verbal consent to proceed.  History of Present Illness   Molly [name] is a 73 year old female with asthma who presents with flu symptoms and worsening cough.  She has been experiencing flu symptoms, including a severe headache, slight fever, rhinorrhea, nasal congestion, and a worsening cough for the past 48 hours. These symptoms have progressively worsened over this period.  She visited the emergency room last night, where she tested positive for influenza. Despite the positive test, she was not prescribed oseltamivir  at that time. She was sent home with Tessalon  Perles and prednisone  taper but has not filled rx yet  She has a history of asthma and has been using her inhaler for management. She experienced an asthma exacerbation a couple of weeks ago but symptoms improved last week  She feels nauseated but has not vomited. She also has diarrhea. Appetite is down        Patient Active Problem List   Diagnosis Date Noted   Hypertension associated with type 2 diabetes mellitus (HCC) 08/11/2023   GERD without esophagitis 08/11/2023   Major depression in remission (HCC) 04/23/2021   Morbid obesity (HCC) 04/23/2021   Peripheral neuropathy due to chemotherapy (HCC) 04/23/2021   Myalgia 03/20/2021   Iron deficiency anemia 11/15/2018   Endometrial adenocarcinoma (HCC) 10/29/2018   Umbilical hernia without obstruction and without gangrene    Atherosclerosis of right coronary artery 11/10/2017   Atherosclerosis of abdominal aorta (HCC) 11/10/2017   Prediabetes 10/02/2017   Perennial allergic rhinitis with seasonal variation 05/14/2016    Asthma, mild intermittent, well-controlled 05/14/2016   Hypertension goal BP (blood pressure) < 140/90 12/10/2015   Cardiomyopathy due to hypertension (HCC) 12/10/2015   History of CVA (cerebrovascular accident) without residual deficits 12/10/2015   Hyperlipidemia LDL goal <70 12/10/2015   Statin intolerance 12/10/2015    Social History   Tobacco Use   Smoking status: Never   Smokeless tobacco: Never   Tobacco comments:    smoking cessation materials not required  Substance Use Topics   Alcohol use: No    Alcohol/week: 0.0 standard drinks of alcohol     Current Outpatient Medications:    albuterol  (VENTOLIN  HFA) 108 (90 Base) MCG/ACT inhaler, Inhale 2 puffs into the lungs every 4 (four) hours as needed for wheezing or shortness of breath., Disp: 1 each, Rfl: 1   amoxicillin -clavulanate (AUGMENTIN ) 875-125 MG tablet, Take 1 tablet by mouth 2 (two) times daily., Disp: 20 tablet, Rfl: 0   benzonatate  (TESSALON  PERLES) 100 MG capsule, Take 1 capsule (100 mg total) by mouth 3 (three) times daily as needed for cough., Disp: 30 capsule, Rfl: 0   BREO ELLIPTA  100-25 MCG/ACT AEPB, INHALE 1 PUFF BY MOUTH ONCE DAILY, Disp: 60 each, Rfl: 10   Calcium  Carb-Cholecalciferol (CALCIUM  1000 + D PO), Take by mouth., Disp: , Rfl:    cetirizine  (ZYRTEC  ALLERGY) 10 MG tablet, Take 1 tablet (10 mg total) by mouth daily., Disp: 30 tablet, Rfl: 1   clopidogrel  (PLAVIX ) 75 MG tablet, Take 75 mg by mouth daily., Disp: , Rfl:  dapagliflozin  propanediol (FARXIGA ) 10 MG TABS tablet, TAKE 1 TABLET BY MOUTH DAILY BEFORE BREAKFAST, Disp: 90 tablet, Rfl: 0   diclofenac  Sodium (VOLTAREN ) 1 % GEL, Apply 4 grams to the skin 4 times daily. Need appt., Disp: 300 g, Rfl: 0   DULoxetine  (CYMBALTA ) 60 MG capsule, Take 1 capsule (60 mg total) by mouth daily., Disp: 30 capsule, Rfl: 3   Evolocumab  (REPATHA  SURECLICK) 140 MG/ML SOAJ, Inject 140 mg into the skin every 14 (fourteen) days., Disp: 2 mL, Rfl: 3   furosemide   (LASIX ) 40 MG tablet, TAKE 1 TABLET BY MOUTH ONCE DAILY AS NEEDED, Disp: 30 tablet, Rfl: 2   ketoconazole  (NIZORAL ) 2 % cream, APPLY TO THE AFFECTED AREA(S) of abdominal fold ONCE daily AS NEEDED, Disp: 60 g, Rfl: 0   olmesartan -hydrochlorothiazide  (BENICAR  HCT) 20-12.5 MG tablet, TAKE 1 TABLET BY MOUTH EVERY MORNING, Disp: 90 tablet, Rfl: 1   oseltamivir  (TAMIFLU ) 75 MG capsule, Take 1 capsule (75 mg total) by mouth 2 (two) times daily., Disp: 10 capsule, Rfl: 0   pantoprazole  (PROTONIX ) 20 MG tablet, Take 1 tablet (20 mg total) by mouth daily., Disp: 30 tablet, Rfl: 3   potassium chloride  SA (KLOR-CON  M) 20 MEQ tablet, TAKE ONE (1) TABLET BY MOUTH TWICE DAILY, Disp: 60 tablet, Rfl: 2   predniSONE  (DELTASONE ) 20 MG tablet, Take 3 tablets (60 mg total) by mouth daily., Disp: 12 tablet, Rfl: 0   pregabalin  (LYRICA ) 100 MG capsule, Take 1 capsule (100 mg total) by mouth at bedtime., Disp: 30 capsule, Rfl: 3  Allergies  Allergen Reactions   Ferumoxytol  Anaphylaxis   Liraglutide  Other (See Comments)    pancreatitis   Nsaids Hives, Rash and Nausea And Vomiting   Clopidogrel  Other (See Comments)    Reports intolerance and states causes myalgias   Statins Other (See Comments) and Nausea And Vomiting    Joint pains   Oxycodone  Nausea And Vomiting   Crestor  [Rosuvastatin  Calcium ] Rash   Zetia  [Ezetimibe ] Other (See Comments)    Muscle weakness and joint pain    ROS  Ten systems reviewed and is negative except as mentioned in HPI    Objective  Vitals:   02/03/24 0952  BP: 134/82  Pulse: (!) 107  Resp: 18  Temp: 98.4 F (36.9 C)  TempSrc: Oral  SpO2: 94%  Weight: 230 lb 11.2 oz (104.6 kg)  Height: 5' (1.524 m)    Body mass index is 45.06 kg/m.    Physical Exam  Constitutional: Patient appears well-developed and well-nourished. Obese  No distress.  HEENT: head atraumatic, normocephalic, pupils equal and reactive to light,, neck supple Cardiovascular: Normal rate, regular  rhythm and normal heart sounds.  No murmur heard. No BLE edema. Pulmonary/Chest: Effort normal and breath sounds normal. No respiratory distress. Abdominal: Soft.  There is no tenderness. Psychiatric: Patient has a normal mood and affect. behavior is normal. Judgment and thought content normal.   Recent Results (from the past 2160 hours)  POCT glycosylated hemoglobin (Hb A1C)     Status: Abnormal   Collection Time: 12/14/23 11:23 AM  Result Value Ref Range   Hemoglobin A1C 6.7 (A) 4.0 - 5.6 %   HbA1c POC (<> result, manual entry)     HbA1c, POC (prediabetic range)     HbA1c, POC (controlled diabetic range)    Lipid panel     Status: None   Collection Time: 12/14/23 12:10 PM  Result Value Ref Range   Cholesterol 147 <200 mg/dL   HDL 65 >  OR = 50 mg/dL   Triglycerides 898 <849 mg/dL   LDL Cholesterol (Calc) 64 mg/dL (calc)    Comment: Reference range: <100 . Desirable range <100 mg/dL for primary prevention;   <70 mg/dL for patients with CHD or diabetic patients  with > or = 2 CHD risk factors. SABRA LDL-C is now calculated using the Martin-Hopkins  calculation, which is a validated novel method providing  better accuracy than the Friedewald equation in the  estimation of LDL-C.  Gladis APPLETHWAITE et al. SANDREA. 7986;689(80): 2061-2068  (http://education.[facility].com/faq/FAQ164)    Total CHOL/HDL Ratio 2.3 <5.0 (calc)   Non-HDL Cholesterol (Calc) 82 <869 mg/dL (calc)    Comment: For patients with diabetes plus 1 major ASCVD risk  factor, treating to a non-HDL-C goal of <100 mg/dL  (LDL-C of <29 mg/dL) is considered a therapeutic  option.   CBC with Differential/Platelet     Status: None   Collection Time: 12/14/23 12:10 PM  Result Value Ref Range   WBC 6.6 3.8 - 10.8 Thousand/uL   RBC 4.13 3.80 - 5.10 Million/uL   Hemoglobin 12.4 11.7 - 15.5 g/dL   HCT 62.0 64.9 - 54.9 %   MCV 91.8 80.0 - 100.0 fL   MCH 30.0 27.0 - 33.0 pg   MCHC 32.7 32.0 - 36.0 g/dL    Comment: For adults,  a slight decrease in the calculated MCHC value (in the range of 30 to 32 g/dL) is most likely not clinically significant; however, it should be interpreted with caution in correlation with other red cell parameters and the patient's clinical condition.    RDW 12.9 11.0 - 15.0 %   Platelets 334 140 - 400 Thousand/uL   MPV 10.2 7.5 - 12.5 fL   Neutro Abs 4,112 1,500 - 7,800 cells/uL   Absolute Lymphocytes 1,934 850 - 3,900 cells/uL   Absolute Monocytes 482 200 - 950 cells/uL   Eosinophils Absolute 53 15 - 500 cells/uL   Basophils Absolute 20 0 - 200 cells/uL   Neutrophils Relative % 62.3 %   Total Lymphocyte 29.3 %   Monocytes Relative 7.3 %   Eosinophils Relative 0.8 %   Basophils Relative 0.3 %  COMPLETE METABOLIC PANEL WITH GFR     Status: Abnormal   Collection Time: 12/14/23 12:10 PM  Result Value Ref Range   Glucose, Bld 101 (H) 65 - 99 mg/dL    Comment: .            Fasting reference interval . For someone without known diabetes, a glucose value between 100 and 125 mg/dL is consistent with prediabetes and should be confirmed with a follow-up test. .    BUN 10 7 - 25 mg/dL   Creat 9.11 9.39 - 8.99 mg/dL   eGFR 70 > OR = 60 fO/fpw/8.26f7   BUN/Creatinine Ratio SEE NOTE: 6 - 22 (calc)    Comment:    Not Reported: BUN and Creatinine are within    reference range. .    Sodium 141 135 - 146 mmol/L   Potassium 4.0 3.5 - 5.3 mmol/L   Chloride 101 98 - 110 mmol/L   CO2 29 20 - 32 mmol/L   Calcium  10.0 8.6 - 10.4 mg/dL   Total Protein 7.0 6.1 - 8.1 g/dL   Albumin 4.1 3.6 - 5.1 g/dL   Globulin 2.9 1.9 - 3.7 g/dL (calc)   AG Ratio 1.4 1.0 - 2.5 (calc)   Total Bilirubin 0.4 0.2 - 1.2 mg/dL   Alkaline phosphatase (APISO) 90  37 - 153 U/L   AST 18 10 - 35 U/L   ALT 11 6 - 29 U/L  ECHOCARDIOGRAM COMPLETE     Status: None   Collection Time: 01/11/24 12:02 PM  Result Value Ref Range   Ao pk vel 1.07 m/s   AV Area VTI 2.90 cm2   AR max vel 2.51 cm2   AV Mean grad 2.0 mmHg    AV Peak grad 4.6 mmHg   S' Lateral 2.80 cm   AV Area mean vel 2.62 cm2   Area-P 1/2 3.53 cm2   MV VTI 2.00 cm2   Est EF 50 - 55%   CBC with Differential     Status: None   Collection Time: 02/02/24  6:51 PM  Result Value Ref Range   WBC 4.0 4.0 - 10.5 K/uL   RBC 4.53 3.87 - 5.11 MIL/uL   Hemoglobin 13.8 12.0 - 15.0 g/dL   HCT 57.7 63.9 - 53.9 %   MCV 93.2 80.0 - 100.0 fL   MCH 30.5 26.0 - 34.0 pg   MCHC 32.7 30.0 - 36.0 g/dL   RDW 86.4 88.4 - 84.4 %   Platelets 278 150 - 400 K/uL   nRBC 0.0 0.0 - 0.2 %   Neutrophils Relative % 47 %   Neutro Abs 1.9 1.7 - 7.7 K/uL   Lymphocytes Relative 36 %   Lymphs Abs 1.4 0.7 - 4.0 K/uL   Monocytes Relative 15 %   Monocytes Absolute 0.6 0.1 - 1.0 K/uL   Eosinophils Relative 1 %   Eosinophils Absolute 0.1 0.0 - 0.5 K/uL   Basophils Relative 1 %   Basophils Absolute 0.0 0.0 - 0.1 K/uL   Immature Granulocytes 0 %   Abs Immature Granulocytes 0.01 0.00 - 0.07 K/uL    Comment: Performed at [facility] [address] [place], [place] [address]  Basic metabolic panel     Status: Abnormal   Collection Time: 02/02/24  6:51 PM  Result Value Ref Range   Sodium 139 135 - 145 mmol/L   Potassium 3.8 3.5 - 5.1 mmol/L   Chloride 99 98 - 111 mmol/L   CO2 28 22 - 32 mmol/L   Glucose, Bld 116 (H) 70 - 99 mg/dL    Comment: Glucose reference range applies only to samples taken after fasting for at least 8 hours.   BUN 9 8 - 23 mg/dL   Creatinine, Ser 9.19 0.44 - 1.00 mg/dL   Calcium  9.9 8.9 - 10.3 mg/dL   GFR, Estimated >39 >39 mL/min    Comment: (NOTE) Calculated using the CKD-EPI Creatinine Equation (2021)    Anion gap 12 5 - 15    Comment: Performed at [facility], [address] [place], [place] [address]  Troponin I (High Sensitivity)     Status: None   Collection Time: 02/02/24  6:51 PM  Result Value Ref Range   Troponin I (High Sensitivity) 6 <18 ng/L    Comment: (NOTE) Elevated high sensitivity troponin I (hsTnI)  values and significant  changes across serial measurements may suggest ACS but many other  chronic and acute conditions are known to elevate hsTnI results.  Refer to the Links section for chest pain algorithms and additional  guidance. Performed at [facility], [address] [place], [place] [address]   Resp panel by RT-PCR (RSV, Flu A&B, Covid) Anterior Nasal Swab     Status: Abnormal   Collection Time: 02/02/24  9:36 PM  Specimen: Anterior Nasal Swab  Result Value Ref Range   SARS Coronavirus 2 by RT PCR NEGATIVE NEGATIVE    Comment: (NOTE) SARS-CoV-2 target nucleic acids are NOT DETECTED.  The SARS-CoV-2 RNA is generally detectable in upper respiratory specimens during the acute phase of infection. The lowest concentration of SARS-CoV-2 viral copies this assay can detect is 138 copies/mL. A negative result does not preclude SARS-Cov-2 infection and should not be used as the sole basis for treatment or other patient management decisions. A negative result may occur with  improper specimen collection/handling, submission of specimen other than nasopharyngeal swab, presence of viral mutation(s) within the areas targeted by this assay, and inadequate number of viral copies(<138 copies/mL). A negative result must be combined with clinical observations, patient history, and epidemiological information. The expected result is Negative.  Fact Sheet for Patients:  bloggercourse.com  Fact Sheet for Healthcare Providers:  seriousbroker.it  This test is no t yet approved or cleared by the [place]  FDA and  has been authorized for detection and/or diagnosis of SARS-CoV-2 by FDA under an Emergency Use Authorization (EUA). This EUA will remain  in effect (meaning this test can be used) for the duration of the COVID-19 declaration under Section 564(b)(1) of the Act, 21 U.S.C.section 360bbb-3(b)(1), unless the  authorization is terminated  or revoked sooner.       Influenza A by PCR POSITIVE (A) NEGATIVE   Influenza B by PCR NEGATIVE NEGATIVE    Comment: (NOTE) The Xpert Xpress SARS-CoV-2/FLU/RSV plus assay is intended as an aid in the diagnosis of influenza from Nasopharyngeal swab specimens and should not be used as a sole basis for treatment. Nasal washings and aspirates are unacceptable for Xpert Xpress SARS-CoV-2/FLU/RSV testing.  Fact Sheet for Patients: bloggercourse.com  Fact Sheet for Healthcare Providers: seriousbroker.it  This test is not yet approved or cleared by the [place]  FDA and has been authorized for detection and/or diagnosis of SARS-CoV-2 by FDA under an Emergency Use Authorization (EUA). This EUA will remain in effect (meaning this test can be used) for the duration of the COVID-19 declaration under Section 564(b)(1) of the Act, 21 U.S.C. section 360bbb-3(b)(1), unless the authorization is terminated or revoked.     Resp Syncytial Virus by PCR NEGATIVE NEGATIVE    Comment: (NOTE) Fact Sheet for Patients: bloggercourse.com  Fact Sheet for Healthcare Providers: seriousbroker.it  This test is not yet approved or cleared by the [place]  FDA and has been authorized for detection and/or diagnosis of SARS-CoV-2 by FDA under an Emergency Use Authorization (EUA). This EUA will remain in effect (meaning this test can be used) for the duration of the COVID-19 declaration under Section 564(b)(1) of the Act, 21 U.S.C. section 360bbb-3(b)(1), unless the authorization is terminated or revoked.  Performed at [facility], [address] [place], [place] [address]      Assessment and Plan    Influenza Positive flu test with symptoms of headache, fever, runny nose, and congestion. Symptoms started on Monday. -Start Tamiflu  twice a day for 5  days. -Advise to avoid contact with others for 7 days to prevent spread of the virus. -Continue to hydrate and rest.  Asthma Recent exacerbation treated with antibiotics. -Continue using inhaler as prescribed.  Cough Worsening cough due to flu. -Continue taking cough pearls as needed. -Consider Coricidin HBP for symptom relief.  Nausea/Diarrhea Reports of feeling nauseous and having diarrhea. -Advise to hydrate to prevent dehydration. -If diarrhea continues, consider taking Imodium. -If nausea worsens, call the  office for further management.  Preventive Care Close contact with patient has potential exposure to flu. -Advise close contact to call her doctor and consider prophylactic Tamiflu .

## 2024-02-08 ENCOUNTER — Ambulatory Visit
Admission: RE | Admit: 2024-02-08 | Discharge: 2024-02-08 | Disposition: A | Discharge status: Home / Self Care | Payer: 59 | Source: Ambulatory Visit | Attending: Family Medicine | Admitting: Family Medicine

## 2024-02-08 ENCOUNTER — Ambulatory Visit
Admission: RE | Admit: 2024-02-08 | Discharge: 2024-02-08 | Disposition: A | Discharge status: Home / Self Care | Payer: 59 | Source: Ambulatory Visit | Attending: Family Medicine

## 2024-02-08 ENCOUNTER — Ambulatory Visit: Payer: 59 | Admitting: Family Medicine

## 2024-02-08 ENCOUNTER — Encounter: Payer: Self-pay | Admitting: Family Medicine

## 2024-02-08 ENCOUNTER — Ambulatory Visit: Payer: Self-pay

## 2024-02-08 VITALS — BP 120/78 | HR 66 | Temp 98.1°F | Resp 20 | Ht 60.0 in | Wt 230.0 lb

## 2024-02-08 DIAGNOSIS — J101 Influenza due to other identified influenza virus with other respiratory manifestations: Secondary | ICD-10-CM | POA: Insufficient documentation

## 2024-02-08 DIAGNOSIS — J45901 Unspecified asthma with (acute) exacerbation: Secondary | ICD-10-CM

## 2024-02-08 DIAGNOSIS — R0602 Shortness of breath: Secondary | ICD-10-CM

## 2024-02-08 DIAGNOSIS — R051 Acute cough: Secondary | ICD-10-CM

## 2024-02-08 DIAGNOSIS — R062 Wheezing: Secondary | ICD-10-CM | POA: Insufficient documentation

## 2024-02-08 MED ORDER — IPRATROPIUM-ALBUTEROL 0.5-2.5 (3) MG/3ML IN SOLN: 3.0000 mL | Freq: Four times a day (QID) | RESPIRATORY_TRACT | 1 refills | Status: AC | PRN

## 2024-02-08 MED ORDER — PREDNISONE 20 MG PO TABS
ORAL_TABLET | ORAL | 0 refills | Status: DC
Start: 2024-02-09 — End: 2024-05-18

## 2024-02-08 MED ORDER — BUDESONIDE 0.5 MG/2ML IN SUSP: 0.5000 mg | Freq: Two times a day (BID) | RESPIRATORY_TRACT | 0 refills | Status: AC | PRN

## 2024-02-08 NOTE — Telephone Encounter (Signed)
  Chief Complaint: Cough, wheezing Symptoms: above Frequency: ongoing Pertinent Negatives: Patient denies  Disposition: [] ED /[] Urgent Care (no appt availability in office) / [x] Appointment(In office/virtual)/ []  [place] [facility]/ [] Home Care/ [] Refused Recommended Disposition /[] [place] [facility]/ []  Follow-up with PCP Additional Notes: Call from pt and her daughter. Pt continues with wheezing and cough. She reports some improvement. Appt scheduled for this afternoon. Daughter is driving down from [place]  and will not arrive until this afternoon.    Reason for Disposition  [1] HIGH RISK (e.g., age > 64 years, pregnant, HIV+, or chronic medical condition) AND [2]  > 72 hours (3 days) since evaluated by doctor (or NP/PA) AND [3] symptoms not improved  Answer Assessment - Initial Assessment Questions 1. DIAGNOSIS CONFIRMATION: "When was the influenza diagnosed?" "By whom?" "Did you get a test for it?"     [facility] 2. MEDICINES: "Were you prescribed any medications for the influenza?"  (e.g., zanamivir [Relenza], oseltamavir [Tamiflu ]).      Tessalon , Pred, Albuterol .  4. SYMPTOMS: "What symptoms are you most concerned about?" (e.g., runny nose, stuffy nose, sore throat, cough, breathing difficulty, fever)     Wheezing, cough  7. RESPIRATORY DISTRESS: "Are you having any trouble breathing?" If Yes, ask: "Describe your breathing."      Wheezing  Protocols used: Influenza (Flu) Follow-up Call-A-AH

## 2024-02-08 NOTE — Progress Notes (Signed)
 Patient ID: Molly [name], female    DOB: 1951-01-11, 73 y.o.   MRN: [id number]  PCP: Arleen Lacer, MD  Chief Complaint  Patient presents with   Cough    Tessalon  pearls didn't help   Wheezing    Subjective:   Molly [name] is a 73 y.o. female, presents to clinic with CC of the following:  HPI  Pt with asthma and Flu A dx last week, she was given tamiflu  about 5 d ago, steroids and cough meds She is feeling a little better but is still coughing and wheezing Last week on 2/4 seen and dx in ED 2/5 recheck by PCP She couldn't get the tamiflu  until 2/6  Wheeze and cough still "awful", woke up in a sweat, no fever, no pain with breathing She is on tamiflu , steroids, did two nebs last night, is on breo, using albuterol  rescue inhaler - has it with her but hasn't used it at all today   Patient Active Problem List   Diagnosis Date Noted   Hypertension associated with type 2 diabetes mellitus (HCC) 08/11/2023   GERD without esophagitis 08/11/2023   Major depression in remission (HCC) 04/23/2021   Morbid obesity (HCC) 04/23/2021   Peripheral neuropathy due to chemotherapy (HCC) 04/23/2021   Myalgia 03/20/2021   Iron deficiency anemia 11/15/2018   Endometrial adenocarcinoma (HCC) 10/29/2018   Umbilical hernia without obstruction and without gangrene    Atherosclerosis of right coronary artery 11/10/2017   Atherosclerosis of abdominal aorta (HCC) 11/10/2017   Prediabetes 10/02/2017   Perennial allergic rhinitis with seasonal variation 05/14/2016   Asthma, mild intermittent, well-controlled 05/14/2016   Hypertension goal BP (blood pressure) < 140/90 12/10/2015   Cardiomyopathy due to hypertension (HCC) 12/10/2015   History of CVA (cerebrovascular accident) without residual deficits 12/10/2015   Hyperlipidemia LDL goal <70 12/10/2015   Statin intolerance 12/10/2015      Current Outpatient Medications:    albuterol  (VENTOLIN  HFA) 108 (90 Base) MCG/ACT inhaler, Inhale 2  puffs into the lungs every 4 (four) hours as needed for wheezing or shortness of breath., Disp: 1 each, Rfl: 1   benzonatate  (TESSALON  PERLES) 100 MG capsule, Take 1 capsule (100 mg total) by mouth 3 (three) times daily as needed for cough., Disp: 30 capsule, Rfl: 0   BREO ELLIPTA  100-25 MCG/ACT AEPB, INHALE 1 PUFF BY MOUTH ONCE DAILY, Disp: 60 each, Rfl: 10   Calcium  Carb-Cholecalciferol (CALCIUM  1000 + D PO), Take by mouth., Disp: , Rfl:    cetirizine  (ZYRTEC  ALLERGY) 10 MG tablet, Take 1 tablet (10 mg total) by mouth daily., Disp: 30 tablet, Rfl: 1   clopidogrel  (PLAVIX ) 75 MG tablet, Take 75 mg by mouth daily., Disp: , Rfl:    dapagliflozin  propanediol (FARXIGA ) 10 MG TABS tablet, TAKE 1 TABLET BY MOUTH DAILY BEFORE BREAKFAST, Disp: 90 tablet, Rfl: 0   diclofenac  Sodium (VOLTAREN ) 1 % GEL, Apply 4 grams to the skin 4 times daily. Need appt., Disp: 300 g, Rfl: 0   DULoxetine  (CYMBALTA ) 60 MG capsule, Take 1 capsule (60 mg total) by mouth daily., Disp: 30 capsule, Rfl: 3   Evolocumab  (REPATHA  SURECLICK) 140 MG/ML SOAJ, Inject 140 mg into the skin every 14 (fourteen) days., Disp: 2 mL, Rfl: 3   furosemide  (LASIX ) 40 MG tablet, TAKE 1 TABLET BY MOUTH ONCE DAILY AS NEEDED, Disp: 30 tablet, Rfl: 2   ketoconazole  (NIZORAL ) 2 % cream, APPLY TO THE AFFECTED AREA(S) of abdominal fold ONCE daily AS NEEDED, Disp: 60  g, Rfl: 0   olmesartan -hydrochlorothiazide  (BENICAR  HCT) 20-12.5 MG tablet, TAKE 1 TABLET BY MOUTH EVERY MORNING, Disp: 90 tablet, Rfl: 1   oseltamivir  (TAMIFLU ) 75 MG capsule, Take 1 capsule (75 mg total) by mouth 2 (two) times daily., Disp: 10 capsule, Rfl: 0   pantoprazole  (PROTONIX ) 20 MG tablet, Take 1 tablet (20 mg total) by mouth daily., Disp: 30 tablet, Rfl: 3   predniSONE  (DELTASONE ) 20 MG tablet, Take 3 tablets (60 mg total) by mouth daily., Disp: 12 tablet, Rfl: 0   pregabalin  (LYRICA ) 100 MG capsule, Take 1 capsule (100 mg total) by mouth at bedtime., Disp: 30 capsule, Rfl:  3   Allergies  Allergen Reactions   Ferumoxytol  Anaphylaxis   Liraglutide  Other (See Comments)    pancreatitis   Nsaids Hives, Rash and Nausea And Vomiting   Clopidogrel  Other (See Comments)    Reports intolerance and states causes myalgias   Statins Other (See Comments) and Nausea And Vomiting    Joint pains   Oxycodone  Nausea And Vomiting   Crestor  [Rosuvastatin  Calcium ] Rash   Zetia  [Ezetimibe ] Other (See Comments)    Muscle weakness and joint pain     Social History   Tobacco Use   Smoking status: Never   Smokeless tobacco: Never   Tobacco comments:    smoking cessation materials not required  Vaping Use   Vaping status: Never Used  Substance Use Topics   Alcohol use: No    Alcohol/week: 0.0 standard drinks of alcohol   Drug use: No      Chart Review Today: I personally reviewed active problem list, medication list, allergies, family history, social history, health maintenance, notes from last encounter, lab results, imaging with the patient/caregiver today.   Review of Systems  Constitutional: Negative.   HENT: Negative.    Eyes: Negative.   Respiratory: Negative.    Cardiovascular: Negative.   Gastrointestinal: Negative.   Endocrine: Negative.   Genitourinary: Negative.   Musculoskeletal: Negative.   Skin: Negative.   Allergic/Immunologic: Negative.   Neurological: Negative.   Hematological: Negative.   Psychiatric/Behavioral: Negative.    All other systems reviewed and are negative.      Objective:   Vitals:   02/08/24 1547 02/08/24 1613  BP: 120/78   Pulse: 66   Resp: 16 20  Temp: 98.1 F (36.7 C)   SpO2: 96%   Weight: 230 lb (104.3 kg)   Height: 5' (1.524 m)     Body mass index is 44.92 kg/m.  Physical Exam Vitals and nursing note reviewed.  Constitutional:      General: She is not in acute distress.    Appearance: She is well-developed and well-groomed. She is obese. She is not toxic-appearing or diaphoretic.  HENT:     Head:  Normocephalic and atraumatic.     Nose: Nose normal.  Eyes:     General:        Right eye: No discharge.        Left eye: No discharge.     Conjunctiva/sclera: Conjunctivae normal.  Neck:     Trachea: No tracheal deviation.  Cardiovascular:     Rate and Rhythm: Normal rate and regular rhythm.     Pulses: Normal pulses.     Heart sounds: Normal heart sounds. No murmur heard.    No gallop.  Pulmonary:     Effort: Pulmonary effort is normal. No accessory muscle usage, respiratory distress or retractions.     Breath sounds: No stridor. Wheezing and rhonchi  present. No decreased breath sounds or rales.  Musculoskeletal:        General: Normal range of motion.  Skin:    General: Skin is warm and dry.     Findings: No rash.  Neurological:     Mental Status: She is alert.     Motor: No abnormal muscle tone.     Coordination: Coordination normal.  Psychiatric:        Behavior: Behavior normal. Behavior is cooperative.      Results for orders placed or performed during the hospital encounter of 02/02/24  CBC with Differential   Collection Time: 02/02/24  6:51 PM  Result Value Ref Range   WBC 4.0 4.0 - 10.5 K/uL   RBC 4.53 3.87 - 5.11 MIL/uL   Hemoglobin 13.8 12.0 - 15.0 g/dL   HCT 84.6 96.2 - 95.2 %   MCV 93.2 80.0 - 100.0 fL   MCH 30.5 26.0 - 34.0 pg   MCHC 32.7 30.0 - 36.0 g/dL   RDW 84.1 32.4 - 40.1 %   Platelets 278 150 - 400 K/uL   nRBC 0.0 0.0 - 0.2 %   Neutrophils Relative % 47 %   Neutro Abs 1.9 1.7 - 7.7 K/uL   Lymphocytes Relative 36 %   Lymphs Abs 1.4 0.7 - 4.0 K/uL   Monocytes Relative 15 %   Monocytes Absolute 0.6 0.1 - 1.0 K/uL   Eosinophils Relative 1 %   Eosinophils Absolute 0.1 0.0 - 0.5 K/uL   Basophils Relative 1 %   Basophils Absolute 0.0 0.0 - 0.1 K/uL   Immature Granulocytes 0 %   Abs Immature Granulocytes 0.01 0.00 - 0.07 K/uL  Basic metabolic panel   Collection Time: 02/02/24  6:51 PM  Result Value Ref Range   Sodium 139 135 - 145 mmol/L    Potassium 3.8 3.5 - 5.1 mmol/L   Chloride 99 98 - 111 mmol/L   CO2 28 22 - 32 mmol/L   Glucose, Bld 116 (H) 70 - 99 mg/dL   BUN 9 8 - 23 mg/dL   Creatinine, Ser 0.27 0.44 - 1.00 mg/dL   Calcium  9.9 8.9 - 10.3 mg/dL   GFR, Estimated >25 >36 mL/min   Anion gap 12 5 - 15  Troponin I (High Sensitivity)   Collection Time: 02/02/24  6:51 PM  Result Value Ref Range   Troponin I (High Sensitivity) 6 <18 ng/L  Resp panel by RT-PCR (RSV, Flu A&B, Covid) Anterior Nasal Swab   Collection Time: 02/02/24  9:36 PM   Specimen: Anterior Nasal Swab  Result Value Ref Range   SARS Coronavirus 2 by RT PCR NEGATIVE NEGATIVE   Influenza A by PCR POSITIVE (A) NEGATIVE   Influenza B by PCR NEGATIVE NEGATIVE   Resp Syncytial Virus by PCR NEGATIVE NEGATIVE       Assessment & Plan:   1. Exacerbation of asthma, unspecified asthma severity, unspecified whether persistent (Primary) She is on da 4 or 5 of meds/steroids, still coughing and wheezy -although she is here for an appointment for wheeze she has not done a breathing treatment or use her rescue inhaler all day I did encourage her to use it more frequently when she is having an exacerbation We will add inhaled steroids, extend her oral steroids, encouraged her to use her rescue inhaler or DuoNebs more frequently - ipratropium-albuterol  (DUONEB) 0.5-2.5 (3) MG/3ML SOLN; Take 3 mLs by nebulization every 6 (six) hours as needed.  Dispense: 180 mL; Refill: 1 - DG  Chest 2 View; Future - budesonide  (PULMICORT ) 0.5 MG/2ML nebulizer solution; Take 2 mLs (0.5 mg total) by nebulization 2 (two) times daily as needed (when having asthma exacerbation). Rinse out mouth after use and spit  Dispense: 60 mL; Refill: 0  2. Influenza A Flu a diagnosed last week there was a delay in getting Tamiflu  she is finishing that today - DG Chest 2 View; Future  3. Acute cough  - DG Chest 2 View; Future  4. Wheeze See #1 - ipratropium-albuterol  (DUONEB) 0.5-2.5 (3) MG/3ML SOLN;  Take 3 mLs by nebulization every 6 (six) hours as needed.  Dispense: 180 mL; Refill: 1 - DG Chest 2 View; Future  5. Shortness of breath Concern with shortness of breath, at rest her vital signs were stable and oxygenating well at rest on room air For short period of time during the exam and encounter today her respiratory rate was looking slightly higher with slight increased work of breathing but was rechecked and was 20 She was able to ambulate an ambulatory pulse ox to maintain oxygen saturation ambulating on room air> 97% Encouraged them to watch her work of breathing and pulse ox at home if able  Reviewed signs of respiratory distress and ER precautions reviewed and verbalized understanding by the patient and her daughter  Currently she appears stable enough to continue outpatient treatment - DG Chest 2 View; Future      Adeline Hone, PA-C 02/08/24 4:10 PM

## 2024-02-08 NOTE — Patient Instructions (Signed)
 If you have any worsening you need to go to the ER  Watch for difficulty breathing, rapid breathing where you cannot catch your breath or speak more than a few words.  If you use a nebulizer treatment or try your rescue inhaler and do not have improved symptoms and feel distressed call 911  If your oxygen goes below 90%  If you are confused or so weak you feel like you are going to pass out

## 2024-02-09 NOTE — Patient Outreach (Signed)
Care Management   Visit Note   Name: Molly [name] MRN: [id number] DOB: Jun 02, 1951  Subjective: Molly [name] is a 73 y.o. year old female who is a primary care patient of Molly Cory, MD. The Care Management team was consulted for assistance.      Engaged with Molly [name] via telephone.  Assessment:  Review of patient past medical history, allergies, medications, health status, including review of consultants reports, laboratory and other test data, was performed as part of  evaluation and provision of care management services.   Outpatient Encounter Medications as of 01/22/2024  Medication Sig   BREO ELLIPTA 100-25 MCG/ACT AEPB INHALE 1 PUFF BY MOUTH ONCE DAILY   Calcium Carb-Cholecalciferol (CALCIUM 1000 + D PO) Take by mouth.   cetirizine (ZYRTEC ALLERGY) 10 MG tablet Take 1 tablet (10 mg total) by mouth daily.   clopidogrel (PLAVIX) 75 MG tablet Take 75 mg by mouth daily.   diclofenac Sodium (VOLTAREN) 1 % GEL Apply 4 grams to the skin 4 times daily. Need appt.   DULoxetine (CYMBALTA) 60 MG capsule Take 1 capsule (60 mg total) by mouth daily.   Evolocumab (REPATHA SURECLICK) 140 MG/ML SOAJ Inject 140 mg into the skin every 14 (fourteen) days.   furosemide (LASIX) 40 MG tablet TAKE 1 TABLET BY MOUTH ONCE DAILY AS NEEDED   ketoconazole (NIZORAL) 2 % cream APPLY TO THE AFFECTED AREA(S) of abdominal fold ONCE daily AS NEEDED   olmesartan-hydrochlorothiazide (BENICAR HCT) 20-12.5 MG tablet TAKE 1 TABLET BY MOUTH EVERY MORNING   pantoprazole (PROTONIX) 20 MG tablet Take 1 tablet (20 mg total) by mouth daily.   pregabalin (LYRICA) 100 MG capsule Take 1 capsule (100 mg total) by mouth at bedtime.   [DISCONTINUED] acetaminophen (TYLENOL) 500 MG tablet Take 2 tablets (1,000 mg total) by mouth every 6 (six) hours as needed for mild pain or moderate pain. (Patient not taking: Reported on 09/23/2023)   [DISCONTINUED] albuterol (VENTOLIN HFA) 108 (90 Base) MCG/ACT inhaler Inhale 2 puffs into  the lungs every 4 (four) hours as needed for wheezing or shortness of breath.   [DISCONTINUED] amoxicillin-clavulanate (AUGMENTIN) 875-125 MG tablet Take 1 tablet by mouth 2 (two) times daily. (Patient not taking: Reported on 02/08/2024)   [DISCONTINUED] benzonatate (TESSALON) 100 MG capsule Take 1-2 capsules (100-200 mg total) by mouth 3 (three) times daily as needed.   [DISCONTINUED] dapagliflozin propanediol (FARXIGA) 10 MG TABS tablet Take 1 tablet (10 mg total) by mouth daily before breakfast.   [DISCONTINUED] ofloxacin (OCUFLOX) 0.3 % ophthalmic solution Place into the left eye. (Patient not taking: Reported on 01/19/2024)   [DISCONTINUED] potassium chloride SA (KLOR-CON M) 20 MEQ tablet TAKE ONE (1) TABLET BY MOUTH TWICE DAILY (Patient not taking: Reported on 02/08/2024)   [DISCONTINUED] predniSONE (STERAPRED UNI-PAK 21 TAB) 10 MG (21) TBPK tablet Use as directed.   No facility-administered encounter medications on file as of 01/22/2024.     Interventions:   Goals Addressed             This Visit's Progress    Monitor, Self-Manage And Reduce Symptoms of Asthma       Current Barriers:  Care Management support and education needs related to Asthma  Planned Interventions: Reviewed plan for management of Asthma and acute respiratory symptoms. Recently evaluated in the clinic for cough, congestion, wheezing and facial/sinus pain. Reports slight improvement of symptoms today. Reviewed medications. Reports taking antibiotic as prescribed. Notes continued productive cough but feel the cough is triggered by sinus  drainage. Reports improvement with wheezing and using Breo and albuterol inhaler as instructed. Reports attempting to stay hydrated to help loosen secretions. Reviewed importance of resting to prevent exacerbation of symptoms r/t overexertion.  Discussed triggers. Advised to avoid known triggers as she has a sensitivity to certain chemicals and allergens which have exacerbated symptoms  in the past. Reviewed current action plan and reinforced importance of daily self-assessment. Advised to make an appointment with provider if symptoms do not improve.  Provided information regarding infection prevention. Advised to utilize prevention strategies and avoid sick contacts to reduce risk of respiratory infection  Reviewed worsening symptoms that require immediate medical attention.    Symptom Management: Complete appointments with the medical team as scheduled Take medications and use inhalers as prescribed Complete course of prescribed antibiotics Assess symptoms daily Notify provider if symptoms do not improve Follow recommendations to prevent respiratory infection Notify provider or care management team with questions and new concerns as needed               PLAN Will follow up next month   Juanell Fairly [facility] RN Care Manager Direct Dial: [phone]  Fax: [phone] Website: [email or web address]

## 2024-02-15 ENCOUNTER — Inpatient Hospital Stay: Payer: 59 | Attending: Oncology

## 2024-02-15 ENCOUNTER — Inpatient Hospital Stay: Payer: 59 | Admitting: Oncology

## 2024-02-22 ENCOUNTER — Other Ambulatory Visit: Payer: Self-pay

## 2024-02-22 NOTE — Patient Outreach (Signed)
 Care Management   Visit Note  02/22/2024 Name: CARRON MCMURRY MRN: [id number] DOB: [birth year]  Subjective: Molly [name] is a 73 y.o. year old female who is a primary care patient of Alba Cory, MD. Engaged with Ms. Row via telephone today.  Assessment:  Review of patient past medical history, allergies, medications, health status, including review of consultants reports, laboratory and other test data, was performed as part of  evaluation and provision of care management services.   Outpatient Encounter Medications as of 02/22/2024  Medication Sig   albuterol (VENTOLIN HFA) 108 (90 Base) MCG/ACT inhaler Inhale 2 puffs into the lungs every 4 (four) hours as needed for wheezing or shortness of breath.   benzonatate (TESSALON PERLES) 100 MG capsule Take 1 capsule (100 mg total) by mouth 3 (three) times daily as needed for cough.   BREO ELLIPTA 100-25 MCG/ACT AEPB INHALE 1 PUFF BY MOUTH ONCE DAILY   budesonide (PULMICORT) 0.5 MG/2ML nebulizer solution Take 2 mLs (0.5 mg total) by nebulization 2 (two) times daily as needed (when having asthma exacerbation). Rinse out mouth after use and spit   Calcium Carb-Cholecalciferol (CALCIUM 1000 + D PO) Take by mouth.   cetirizine (ZYRTEC ALLERGY) 10 MG tablet Take 1 tablet (10 mg total) by mouth daily.   clopidogrel (PLAVIX) 75 MG tablet Take 75 mg by mouth daily.   dapagliflozin propanediol (FARXIGA) 10 MG TABS tablet TAKE 1 TABLET BY MOUTH DAILY BEFORE BREAKFAST   diclofenac Sodium (VOLTAREN) 1 % GEL Apply 4 grams to the skin 4 times daily. Need appt.   DULoxetine (CYMBALTA) 60 MG capsule Take 1 capsule (60 mg total) by mouth daily.   Evolocumab (REPATHA SURECLICK) 140 MG/ML SOAJ Inject 140 mg into the skin every 14 (fourteen) days.   furosemide (LASIX) 40 MG tablet TAKE 1 TABLET BY MOUTH ONCE DAILY AS NEEDED   ipratropium-albuterol (DUONEB) 0.5-2.5 (3) MG/3ML SOLN Take 3 mLs by nebulization every 6 (six) hours as needed.   ketoconazole  (NIZORAL) 2 % cream APPLY TO THE AFFECTED AREA(S) of abdominal fold ONCE daily AS NEEDED   olmesartan-hydrochlorothiazide (BENICAR HCT) 20-12.5 MG tablet TAKE 1 TABLET BY MOUTH EVERY MORNING   oseltamivir (TAMIFLU) 75 MG capsule Take 1 capsule (75 mg total) by mouth 2 (two) times daily.   pantoprazole (PROTONIX) 20 MG tablet Take 1 tablet (20 mg total) by mouth daily.   predniSONE (DELTASONE) 20 MG tablet Take 3 tablets (60 mg total) by mouth daily.   predniSONE (DELTASONE) 20 MG tablet 2 tabs poqday 1-3, 1 tabs poqday 4-6   pregabalin (LYRICA) 100 MG capsule Take 1 capsule (100 mg total) by mouth at bedtime.   No facility-administered encounter medications on file as of 02/22/2024.    Interventions:   Goals Addressed             This Visit's Progress    Effective Management of Chronic Health Conditions       Current Barriers:  Care Management support and education needs related to health management    Planned Interventions: Interventions Today    Flowsheet Row Most Recent Value  Chronic Disease   Chronic disease during today's visit Other, Diabetes  [Asthma]  General Interventions   General Interventions Discussed/Reviewed General Interventions Discussed, Labs, Durable Medical Equipment (DME), Doctor Visits  Doctor Visits Discussed/Reviewed PCP, Doctor Visits Reviewed  [Will follow up with [facility] on 03/07/24. Will follow up with Primary Care Provider on 04/18/24]  Durable Medical Equipment (DME) Other, Glucomoter  [  Nebulizer]  Education Interventions   Education Provided Provided Education  Provided Verbal Education On Medication, When to see the doctor, [employer], Nutrition, Labs, Blood Sugar Monitoring  [Reports recently changing from [place] to [employer]. Will keep team updated if assistance is needed with transitioning to [facility]'s preferred medications]  Labs Reviewed Hgb A1c  Nutrition Interventions   Nutrition Discussed/Reviewed  Nutrition Reviewed, Fluid intake, Carbohydrate meal planning, Decreasing sugar intake  Pharmacy Interventions   Pharmacy Dicussed/Reviewed Medications and their functions  Safety Interventions   Safety Discussed/Reviewed Safety Reviewed, Fall Risk  Advanced Directive Interventions   Advanced Directives Discussed/Reviewed Advanced Directives Discussed  [Will include information regarding Advanced Directives in patient instructions.]         Self-Care/Symptom Management Attend all scheduled provider appointments Take medications as prescribed   Continue using inhalers and nebulizer treatments as advised Call pharmacy for medication refills 3-7 days in advance of running out of medications Call doctor with any symptoms you believe are related to your medicine Follow recommended Asthma Action Plan Seek immediate medical attention if your symptoms return and worsen Call provider office for new concerns or questions                PLAN Ms Rommel agreed to follow up in three weeks.    Juanell Fairly [facility] Population Health RN Care Manager Direct Dial: [phone]  Fax: [phone] Website: [email or web address]

## 2024-02-22 NOTE — Patient Instructions (Addendum)
 Thank you for allowing the  Care Management team to participate in your care. It was great speaking with you today. I'm so glad you're feeling better!   Reminders: Please continue to take your medications and use inhalers and nebulizer as prescribed. Please continue to follow your Asthma  Action Plan and avoid known triggers to prevent exacerbation. Remember to seek immediate medical attention if your symptoms return/worsen and you experience difficulty breathing. We will follow up on March 15, 2024 at 0930.  Please do not hesitate to contact me if you require assistance prior to our  next outreach.    Juanell Fairly [facility] Population [facility] RN Care Manager Direct Dial: [phone]  Fax: [phone] Website: [email or web address]

## 2024-03-02 ENCOUNTER — Other Ambulatory Visit: Payer: Self-pay

## 2024-03-02 DIAGNOSIS — I700 Atherosclerosis of aorta: Secondary | ICD-10-CM

## 2024-03-02 DIAGNOSIS — E1169 Type 2 diabetes mellitus with other specified complication: Secondary | ICD-10-CM

## 2024-03-02 DIAGNOSIS — I43 Cardiomyopathy in diseases classified elsewhere: Secondary | ICD-10-CM

## 2024-03-02 MED ORDER — FARXIGA 10 MG PO TABS: 10.0000 mg | ORAL_TABLET | Freq: Every day | ORAL | 0 refills | Status: DC

## 2024-03-02 MED ORDER — PRALUENT 75 MG/ML ~~LOC~~ SOAJ: 75.0000 mg | SUBCUTANEOUS | 2 refills | Status: DC

## 2024-03-02 NOTE — Progress Notes (Unsigned)
 D/c Repatha due to insurance, tee'd up Praulent unsure the dose for patient, please verify.  Molly [name] only new prescription, canceled old one.

## 2024-03-07 ENCOUNTER — Ambulatory Visit: Payer: 59 | Admitting: Dietician

## 2024-03-15 ENCOUNTER — Other Ambulatory Visit: Payer: Self-pay

## 2024-03-15 NOTE — Patient Outreach (Signed)
 Care Management   Visit Note  03/15/2024 Name: Molly [name] MRN: [id number] DOB: Mar 27, 1951  Subjective: Molly [name] is a 73 y.o. year old female who is a primary care patient of Molly Cory, MD. Engaged with Molly [name] via telephone today.   Assessment:  Review of patient past medical history, allergies, medications, health status, including review of consultants reports, laboratory and other test data, was performed as part of  evaluation and provision of care management services.   Outpatient Encounter Medications as of 03/15/2024  Medication Sig Note   albuterol (VENTOLIN HFA) 108 (90 Base) MCG/ACT inhaler Inhale 2 puffs into the lungs every 4 (four) hours as needed for wheezing or shortness of breath.    ipratropium-albuterol (DUONEB) 0.5-2.5 (3) MG/3ML SOLN Take 3 mLs by nebulization every 6 (six) hours as needed.    ketoconazole (NIZORAL) 2 % cream APPLY TO THE AFFECTED AREA(S) of abdominal fold ONCE daily AS NEEDED    pantoprazole (PROTONIX) 20 MG tablet Take 1 tablet (20 mg total) by mouth daily.    pregabalin (LYRICA) 100 MG capsule Take 1 capsule (100 mg total) by mouth at bedtime.    Alirocumab (PRALUENT) 75 MG/ML SOAJ Inject 1 mL (75 mg total) into the skin every 14 (fourteen) days. 03/15/2024: Pending receipt from [facility]   benzonatate (TESSALON PERLES) 100 MG capsule Take 1 capsule (100 mg total) by mouth 3 (three) times daily as needed for cough.    BREO ELLIPTA 100-25 MCG/ACT AEPB INHALE 1 PUFF BY MOUTH ONCE DAILY    budesonide (PULMICORT) 0.5 MG/2ML nebulizer solution Take 2 mLs (0.5 mg total) by nebulization 2 (two) times daily as needed (when having asthma exacerbation). Rinse out mouth after use and spit    Calcium Carb-Cholecalciferol (CALCIUM 1000 + D PO) Take by mouth.    cetirizine (ZYRTEC ALLERGY) 10 MG tablet Take 1 tablet (10 mg total) by mouth daily.    clopidogrel (PLAVIX) 75 MG tablet Take 75 mg by mouth daily.    diclofenac Sodium (VOLTAREN) 1 %  GEL Apply 4 grams to the skin 4 times daily. Need appt.    DULoxetine (CYMBALTA) 60 MG capsule Take 1 capsule (60 mg total) by mouth daily.    FARXIGA 10 MG TABS tablet Take 1 tablet (10 mg total) by mouth daily before breakfast.    furosemide (LASIX) 40 MG tablet TAKE 1 TABLET BY MOUTH ONCE DAILY AS NEEDED    olmesartan-hydrochlorothiazide (BENICAR HCT) 20-12.5 MG tablet TAKE 1 TABLET BY MOUTH EVERY MORNING    oseltamivir (TAMIFLU) 75 MG capsule Take 1 capsule (75 mg total) by mouth 2 (two) times daily. (Patient not taking: Reported on 03/15/2024)    predniSONE (DELTASONE) 20 MG tablet Take 3 tablets (60 mg total) by mouth daily. (Patient not taking: Reported on 03/15/2024)    predniSONE (DELTASONE) 20 MG tablet 2 tabs poqday 1-3, 1 tabs poqday 4-6 (Patient not taking: Reported on 03/15/2024)    No facility-administered encounter medications on file as of 03/15/2024.    Interventions:  Interventions Today    Flowsheet Row Most Recent Value  Chronic Disease   Chronic disease during today's visit Diabetes, Hypertension (HTN), Other  [Asthma]  General Interventions   General Interventions Discussed/Reviewed General Interventions Reviewed, Annual Eye Exam, Labs, Annual Foot Exam, Lipid Profile, Doctor Visits  Labs Hgb A1c every 6 months  Doctor Visits Discussed/Reviewed Doctor Visits Reviewed, PCP  [Will follow up with primary care provider on 04/18/24]  PCP/Specialist Visits Compliance with follow-up visit  [  Pending follow up with primary care provider on 04/18/24]  Exercise Interventions   Exercise Discussed/Reviewed Physical Activity  Education Interventions   Education Provided Provided Education  Provided Verbal Education On Nutrition, Labs, Blood Sugar Monitoring, Medication, When to see the doctor, Development worker, community, [employer]  [Reports missing appointment with [employer]. Plans to obtain books/written meal plan for better heart healthy/diabetic diet adherence]  Labs Reviewed Hgb  A1c, Lipid Profile  Nutrition Interventions   Nutrition Discussed/Reviewed Nutrition Discussed, Carbohydrate meal planning, Adding fruits and vegetables, Fluid intake, Portion sizes, Decreasing sugar intake, Increasing proteins, Decreasing fats, Decreasing salt  Pharmacy Interventions   Pharmacy Dicussed/Reviewed Pharmacy Topics Reviewed, Medications and their functions  [Discussed change in medications due to insurance coverage. Repatha was discontinued and will be replaced with Pralulent 75mg  injection every 14 days. She is pending receipt of this medication from [facility]]  Safety Interventions   Safety Discussed/Reviewed Safety Reviewed         PLAN Molly [name] agreed to follow up next month    Molly [name] [facility] RN Care Manager Direct Dial: [phone]  Fax: [phone] Website: [email or web address]

## 2024-03-15 NOTE — Patient Instructions (Addendum)
 Thank you for allowing the Care Management team to participate in your care. It was great speaking with  you today!    Reminders: Please be sure to contact [facility] regarding timeframe for delivery of your Praluent.  Please be sure to monitor your blood pressures and blood sugars and maintain a log. We will follow up on April 25, 2024 at 1000.  Please do not hesitate to contact me if you require assistance prior to our next outreach.    Juanell Fairly [facility] RN Care Manager Direct Dial: [phone]  Fax: [phone] Website: [email or web address]

## 2024-03-22 ENCOUNTER — Other Ambulatory Visit: Payer: Self-pay | Admitting: Nurse Practitioner

## 2024-03-22 DIAGNOSIS — I10 Essential (primary) hypertension: Secondary | ICD-10-CM

## 2024-03-24 NOTE — Telephone Encounter (Signed)
 Requested Prescriptions  Pending Prescriptions Disp Refills   olmesartan-hydrochlorothiazide (BENICAR HCT) 20-12.5 MG tablet [Pharmacy Med Name: OLMESARTAN-HCTZ 20-12.5 MG 20-12.5 Tablet] 90 tablet 0    Sig: TAKE 1 TABLET BY MOUTH EVERY MORNING     Cardiovascular: ARB + Diuretic Combos Passed - 03/24/2024 11:57 AM      Passed - K in normal range and within 180 days    Potassium  Date Value Ref Range Status  02/02/2024 3.8 3.5 - 5.1 mmol/L Final         Passed - Na in normal range and within 180 days    Sodium  Date Value Ref Range Status  02/02/2024 139 135 - 145 mmol/L Final         Passed - Cr in normal range and within 180 days    Creat  Date Value Ref Range Status  12/14/2023 0.88 0.60 - 1.00 mg/dL Final   Creatinine, Ser  Date Value Ref Range Status  02/02/2024 0.80 0.44 - 1.00 mg/dL Final   Creatinine, Urine  Date Value Ref Range Status  08/11/2023 81 20 - 275 mg/dL Final         Passed - eGFR is 10 or above and within 180 days    GFR, Est African American  Date Value Ref Range Status  11/07/2020 81 > OR = 60 mL/min/1.55m2 Final   GFR, Est Non African American  Date Value Ref Range Status  11/07/2020 70 > OR = 60 mL/min/1.8m2 Final   GFR, Estimated  Date Value Ref Range Status  02/02/2024 >60 >60 mL/min Final    Comment:    (NOTE) Calculated using the CKD-EPI Creatinine Equation (2021)    eGFR  Date Value Ref Range Status  12/14/2023 70 > OR = 60 mL/min/1.31m2 Final         Passed - Patient is not pregnant      Passed - Last BP in normal range    BP Readings from Last 1 Encounters:  02/08/24 120/78         Passed - Valid encounter within last 6 months    Recent Outpatient Visits           1 month ago Exacerbation of asthma, unspecified asthma severity, unspecified whether persistent   [facility] Danelle Berry, PA-C   1 month ago Influenza A   [facility] Alba Cory, MD        Future Appointments             In 3 weeks Alba Cory, MD [facility], [facility]

## 2024-03-29 ENCOUNTER — Other Ambulatory Visit: Payer: Self-pay | Admitting: Family Medicine

## 2024-03-29 DIAGNOSIS — I43 Cardiomyopathy in diseases classified elsewhere: Secondary | ICD-10-CM

## 2024-03-29 DIAGNOSIS — I10 Essential (primary) hypertension: Secondary | ICD-10-CM

## 2024-03-29 DIAGNOSIS — R600 Localized edema: Secondary | ICD-10-CM

## 2024-04-06 ENCOUNTER — Encounter: Payer: Self-pay | Admitting: Dietician

## 2024-04-06 NOTE — Progress Notes (Signed)
 Have not heard back from patient to reschedule her missed appointment from 03/07/24. Sent notification to referring provider.

## 2024-04-18 ENCOUNTER — Ambulatory Visit: Payer: Self-pay | Admitting: Family Medicine

## 2024-04-25 ENCOUNTER — Other Ambulatory Visit: Payer: Self-pay

## 2024-04-25 ENCOUNTER — Telehealth: Payer: Self-pay

## 2024-04-27 LAB — BASIC METABOLIC PANEL WITH GFR: Creatinine: 0.6 (ref 0.5–1.1)

## 2024-04-27 LAB — FECAL OCCULT BLOOD, IMMUNOCHEMICAL: IFOBT: NEGATIVE

## 2024-04-27 LAB — COMPREHENSIVE METABOLIC PANEL WITH GFR: eGFR: 90.001

## 2024-05-09 NOTE — Telephone Encounter (Unsigned)
 Copied from CRM [id number]. Topic: Clinical - Medication Refill >> May 09, 2024 10:57 AM Marissa P wrote: Medication: pantoprazole  pantoprazole  (PROTONIX ) 20 MG tablet, pregabalin  pregabalin  (LYRICA ) 100 MG capsule, furosemide  (LASIX ) 40 MG tablet, Potassium chl 20meq mic tab.   Has the patient contacted their pharmacy? Yes (Agent: If no, request that the patient contact the pharmacy for the refill. If patient does not wish to contact the pharmacy document the reason why and proceed with request.) (Agent: If yes, when and what did the pharmacy advise?)  This is the patient's preferred pharmacy:  [facility] [place] - [address] [place] [address] Phone: [phone] Fax: [phone]   Is this the correct pharmacy for this prescription? Yes If no, delete pharmacy and type the correct one.   Has the prescription been filled recently? Yes  Is the patient out of the medication? No  Has the patient been seen for an appointment in the last year OR does the patient have an upcoming appointment? Yes  Can we respond through MyChart? Unsure   Agent: Please be advised that Rx refills may take up to 3 business days. We ask that you follow-up with your pharmacy.

## 2024-05-17 ENCOUNTER — Telehealth: Payer: Self-pay

## 2024-05-17 ENCOUNTER — Other Ambulatory Visit: Payer: Self-pay | Admitting: Family Medicine

## 2024-05-17 DIAGNOSIS — G620 Drug-induced polyneuropathy: Secondary | ICD-10-CM

## 2024-05-17 NOTE — Telephone Encounter (Signed)
 Spoke with pt and appt scheduled for June 11

## 2024-05-17 NOTE — Telephone Encounter (Signed)
 Pt needs a call back about Farxiga . She is having issues with how it makes her feel and would like a return call.

## 2024-05-17 NOTE — Telephone Encounter (Signed)
Pt scheduled an appt tomorrow.

## 2024-05-17 NOTE — Telephone Encounter (Signed)
 Patient was taking it before breakfast and was vomiting.  Now patient is taking it after breakfast then an 1-2 hours starts feeling weak and feels heart palpitating. Pt concerned if this can be cause from farxiga  or if the dose is too much.

## 2024-05-18 ENCOUNTER — Ambulatory Visit: Admitting: Family Medicine

## 2024-05-18 ENCOUNTER — Ambulatory Visit: Attending: Family Medicine

## 2024-05-18 ENCOUNTER — Encounter: Payer: Self-pay | Admitting: Family Medicine

## 2024-05-18 VITALS — BP 134/84 | HR 85 | Resp 16 | Ht 60.0 in | Wt 231.7 lb

## 2024-05-18 DIAGNOSIS — R5383 Other fatigue: Secondary | ICD-10-CM

## 2024-05-18 DIAGNOSIS — I119 Hypertensive heart disease without heart failure: Secondary | ICD-10-CM

## 2024-05-18 DIAGNOSIS — J4540 Moderate persistent asthma, uncomplicated: Secondary | ICD-10-CM

## 2024-05-18 DIAGNOSIS — E1169 Type 2 diabetes mellitus with other specified complication: Secondary | ICD-10-CM

## 2024-05-18 DIAGNOSIS — E538 Deficiency of other specified B group vitamins: Secondary | ICD-10-CM

## 2024-05-18 DIAGNOSIS — R002 Palpitations: Secondary | ICD-10-CM | POA: Diagnosis not present

## 2024-05-18 DIAGNOSIS — E559 Vitamin D deficiency, unspecified: Secondary | ICD-10-CM

## 2024-05-18 DIAGNOSIS — E785 Hyperlipidemia, unspecified: Secondary | ICD-10-CM

## 2024-05-18 DIAGNOSIS — Z7984 Long term (current) use of oral hypoglycemic drugs: Secondary | ICD-10-CM

## 2024-05-18 DIAGNOSIS — I43 Cardiomyopathy in diseases classified elsewhere: Secondary | ICD-10-CM

## 2024-05-18 NOTE — Progress Notes (Signed)
 Name: Molly [name]   MRN: [id number]    DOB: 1951/11/27   Date:05/18/2024       Progress Note  Subjective  Chief Complaint  Chief Complaint  Patient presents with   Extremity Weakness    Ongoing for months    Discussed the use of AI scribe software for clinical note transcription with the patient, who gave verbal consent to proceed.  History of Present Illness Molly [name] is a 73 year old female with diabetes, coronary artery disease, and cardiomyopathy who presents with fatigue and chest sensations.  She experiences fatigue and a sensation of weakness primarily during physical activities such as standing or kneeling during prayer sessions. This occurs about one to two times a week and is not associated with shortness of breath, chest pain, nausea, or sweating. She describes the sensation as 'all my strength goes' and feels weak, particularly in her stomach area.  She mentions a 'weird' sensation in her chest, described as a fluttering feeling, though not as intense as previous episodes of fluttering she has experienced. This sensation is not accompanied by pain or tightness.  Her past medical history includes diabetes, coronary artery disease with plaque formation in the right coronary artery, a history of stroke, and cardiomyopathy due to high blood pressure. She has a cardiologist but currently does not have a follow-up appointment scheduled.  In February, she was hospitalized for the flu, but she does not believe her current symptoms are related to that illness. She recalls a previous incident in 2021 where she felt weak and unable to speak at a restaurant, but this is not similar to her current symptoms.  She has a history of low B12 and vitamin D  levels, but she has not taken B12 supplements. Her current medications include albuterol  and Breo for asthma, but she does not take Breo daily. She also does not take over-the-counter allergy medications like Zyrtec  or Claritin  regularly,  which she has used in the past for her allergies.    Patient Active Problem List   Diagnosis Date Noted   Hypertension associated with type 2 diabetes mellitus (HCC) 08/11/2023   GERD without esophagitis 08/11/2023   Major depression in remission (HCC) 04/23/2021   Morbid obesity (HCC) 04/23/2021   Peripheral neuropathy due to chemotherapy (HCC) 04/23/2021   Myalgia 03/20/2021   Iron deficiency anemia 11/15/2018   Endometrial adenocarcinoma (HCC) 10/29/2018   Umbilical hernia without obstruction and without gangrene    Atherosclerosis of right coronary artery 11/10/2017   Atherosclerosis of abdominal aorta (HCC) 11/10/2017   Prediabetes 10/02/2017   Perennial allergic rhinitis with seasonal variation 05/14/2016   Asthma, mild intermittent, well-controlled 05/14/2016   Hypertension goal BP (blood pressure) < 140/90 12/10/2015   Cardiomyopathy due to hypertension (HCC) 12/10/2015   History of CVA (cerebrovascular accident) without residual deficits 12/10/2015   Hyperlipidemia LDL goal <70 12/10/2015   Statin intolerance 12/10/2015    Social History   Tobacco Use   Smoking status: Never   Smokeless tobacco: Never   Tobacco comments:    smoking cessation materials not required  Substance Use Topics   Alcohol use: No    Alcohol/week: 0.0 standard drinks of alcohol     Current Outpatient Medications:    albuterol  (VENTOLIN  HFA) 108 (90 Base) MCG/ACT inhaler, Inhale 2 puffs into the lungs every 4 (four) hours as needed for wheezing or shortness of breath., Disp: 1 each, Rfl: 1   Alirocumab  (PRALUENT ) 75 MG/ML SOAJ, Inject 1 mL (75 mg total)  into the skin every 14 (fourteen) days., Disp: 2 mL, Rfl: 2   BREO ELLIPTA  100-25 MCG/ACT AEPB, INHALE 1 PUFF BY MOUTH ONCE DAILY, Disp: 60 each, Rfl: 10   budesonide  (PULMICORT ) 0.5 MG/2ML nebulizer solution, Take 2 mLs (0.5 mg total) by nebulization 2 (two) times daily as needed (when having asthma exacerbation). Rinse out mouth after use and  spit, Disp: 60 mL, Rfl: 0   Calcium  Carb-Cholecalciferol (CALCIUM  1000 + D PO), Take by mouth., Disp: , Rfl:    cetirizine  (ZYRTEC  ALLERGY) 10 MG tablet, Take 1 tablet (10 mg total) by mouth daily., Disp: 30 tablet, Rfl: 1   clopidogrel  (PLAVIX ) 75 MG tablet, Take 75 mg by mouth daily., Disp: , Rfl:    diclofenac  Sodium (VOLTAREN ) 1 % GEL, Apply 4 grams to the skin 4 times daily. Need appt., Disp: 300 g, Rfl: 0   DULoxetine  (CYMBALTA ) 60 MG capsule, Take 1 capsule (60 mg total) by mouth daily., Disp: 30 capsule, Rfl: 3   FARXIGA  10 MG TABS tablet, Take 1 tablet (10 mg total) by mouth daily before breakfast., Disp: 90 tablet, Rfl: 0   furosemide  (LASIX ) 40 MG tablet, TAKE 1 TABLET BY MOUTH ONCE DAILY AS NEEDED, Disp: 30 tablet, Rfl: 0   ipratropium-albuterol  (DUONEB) 0.5-2.5 (3) MG/3ML SOLN, Take 3 mLs by nebulization every 6 (six) hours as needed., Disp: 180 mL, Rfl: 1   ketoconazole  (NIZORAL ) 2 % cream, APPLY TO THE AFFECTED AREA(S) of abdominal fold ONCE daily AS NEEDED, Disp: 60 g, Rfl: 0   olmesartan -hydrochlorothiazide  (BENICAR  HCT) 20-12.5 MG tablet, TAKE 1 TABLET BY MOUTH EVERY MORNING, Disp: 90 tablet, Rfl: 0   pantoprazole  (PROTONIX ) 20 MG tablet, TAKE 1 TABLET (20 MG TOTAL) BY MOUTH DAILY., Disp: 30 tablet, Rfl: 1   pregabalin  (LYRICA ) 100 MG capsule, TAKE 1 CAPSULE (100 MG TOTAL) BY MOUTH AT BEDTIME., Disp: 30 capsule, Rfl: 1  Allergies  Allergen Reactions   Ferumoxytol  Anaphylaxis   Liraglutide  Other (See Comments)    pancreatitis   Nsaids Hives, Rash and Nausea And Vomiting   Clopidogrel  Other (See Comments)    Reports intolerance and states causes myalgias   Statins Other (See Comments) and Nausea And Vomiting    Joint pains   Oxycodone  Nausea And Vomiting   Crestor  [Rosuvastatin  Calcium ] Rash   Zetia  [Ezetimibe ] Other (See Comments)    Muscle weakness and joint pain    ROS  Ten systems reviewed and is negative except as mentioned in HPI    Objective  Vitals:    05/18/24 1036 05/18/24 1110  BP: (!) 146/90 134/84  Pulse: 85   Resp: 16   SpO2: 95%   Weight: 231 lb 11.2 oz (105.1 kg)   Height: 5' (1.524 m)     Body mass index is 45.25 kg/m.   Physical Exam CONSTITUTIONAL: Patient appears well-developed and well-nourished. Obese No distress. HEENT: Head atraumatic, normocephalic, neck supple. CARDIOVASCULAR: Normal rate, regular rhythm and normal heart sounds. No murmur heard. No BLE edema. PULMONARY: Effort normal and breath sounds normal. Lungs clear to auscultation, no wheezing. No respiratory distress. ABDOMINAL: There is no tenderness or distention. PSYCHIATRIC: Patient has a normal mood and affect. Behavior is normal. Judgment and thought content normal.  Recent Results (from the past 2160 hours)  Fecal occult blood, imunochemical     Status: None   Collection Time: 04/27/24 12:00 AM  Result Value Ref Range   IFOBT Negative     Comment: Letsgetchecked  Basic metabolic panel with GFR  Status: None   Collection Time: 04/27/24 12:00 AM  Result Value Ref Range   Creatinine 0.6 0.5 - 1.1  Comprehensive metabolic panel with GFR     Status: None   Collection Time: 04/27/24 12:00 AM  Result Value Ref Range   eGFR 90.001       Assessment & Plan Fatigue Intermittent fatigue possibly due to deconditioning, anemia, or thyroid  dysfunction. Risk factors include diabetes, heart disease, and stroke. Due to episodes happening when she has palpitation we will order a Zio patch and send her back to see cardiologist  - Order blood tests: thyroid  function, B12, folate, vitamin D . - Place Zio patch for 14 days.  Palpitations Intermittent palpitations possibly due to rhythm-related issues. - Place Zio patch for 14 days. - Order thyroid  function tests.  Cardiomyopathy due to hypertension Cardiomyopathy secondary to hypertension with no recent cardiology follow-up. High risk for complications. - Contact Dr. Carl Charnley for cardiology follow-up. -  Advise cardiology follow-up.  Diabetes mellitus Diabetes mellitus with dyslipidemia, check labs today to discuss during upcoming visit next month  - Order A1c, cholesterol, urine microalbumin. - Advise against sugar prick test next visit.  Allergic asthma, moderate persistent Moderate persistent allergic asthma with increased symptoms due to inconsistent medication use. - Advise daily Breo inhaler use. - Recommend Zyrtec  or Claritin  without D. - Ensure consistent allergy medication use.

## 2024-05-19 ENCOUNTER — Ambulatory Visit: Payer: Self-pay | Admitting: Family Medicine

## 2024-05-19 LAB — LIPID PANEL
Cholesterol: 234 mg/dL — ABNORMAL HIGH (ref <200)
HDL: 60 mg/dL (ref >=50)
LDL Cholesterol (Calc): 148 mg/dL — ABNORMAL HIGH
Non-HDL Cholesterol (Calc): 174 mg/dL — ABNORMAL HIGH (ref <130)
Total CHOL/HDL Ratio: 3.9 (calc) (ref <5.0)
Triglycerides: 133 mg/dL (ref <150)

## 2024-05-19 LAB — COMPREHENSIVE METABOLIC PANEL WITH GFR
AG Ratio: 1.6 (calc) (ref 1.0–2.5)
ALT: 9 U/L (ref 6–29)
AST: 15 U/L (ref 10–35)
Albumin: 4.1 g/dL (ref 3.6–5.1)
Alkaline phosphatase (APISO): 73 U/L (ref 37–153)
BUN: 13 mg/dL (ref 7–25)
CO2: 31 mmol/L (ref 20–32)
Calcium: 9.6 mg/dL (ref 8.6–10.4)
Chloride: 101 mmol/L (ref 98–110)
Creat: 0.78 mg/dL (ref 0.60–1.00)
Globulin: 2.6 g/dL (ref 1.9–3.7)
Glucose, Bld: 110 mg/dL — ABNORMAL HIGH (ref 65–99)
Potassium: 4.1 mmol/L (ref 3.5–5.3)
Sodium: 140 mmol/L (ref 135–146)
Total Bilirubin: 0.6 mg/dL (ref 0.2–1.2)
Total Protein: 6.7 g/dL (ref 6.1–8.1)
eGFR: 81 mL/min/{1.73_m2} (ref >=60)

## 2024-05-19 LAB — CBC WITH DIFFERENTIAL/PLATELET
Absolute Lymphocytes: 1718 {cells}/uL (ref 850–3900)
Absolute Monocytes: 408 {cells}/uL (ref 200–950)
Basophils Absolute: 38 {cells}/uL (ref 0–200)
Basophils Relative: 0.8 %
Eosinophils Absolute: 58 {cells}/uL (ref 15–500)
Eosinophils Relative: 1.2 %
HCT: 38.4 % (ref 35.0–45.0)
Hemoglobin: 12.6 g/dL (ref 11.7–15.5)
MCH: 30.4 pg (ref 27.0–33.0)
MCHC: 32.8 g/dL (ref 32.0–36.0)
MCV: 92.5 fL (ref 80.0–100.0)
MPV: 9.9 fL (ref 7.5–12.5)
Monocytes Relative: 8.5 %
Neutro Abs: 2578 {cells}/uL (ref 1500–7800)
Neutrophils Relative %: 53.7 %
Platelets: 288 10*3/uL (ref 140–400)
RBC: 4.15 10*6/uL (ref 3.80–5.10)
RDW: 13.3 % (ref 11.0–15.0)
Total Lymphocyte: 35.8 %
WBC: 4.8 10*3/uL (ref 3.8–10.8)

## 2024-05-19 LAB — TSH: TSH: 1.04 m[IU]/L (ref 0.40–4.50)

## 2024-05-19 LAB — B12 AND FOLATE PANEL
Folate: 11 ng/mL
Vitamin B-12: 453 pg/mL (ref 200–1100)

## 2024-05-19 LAB — HEMOGLOBIN A1C
Hgb A1c MFr Bld: 6.7 % — ABNORMAL HIGH (ref <5.7)
Mean Plasma Glucose: 146 mg/dL
eAG (mmol/L): 8.1 mmol/L

## 2024-05-19 LAB — MICROALBUMIN / CREATININE URINE RATIO
Creatinine, Urine: 92 mg/dL (ref 20–275)
Microalb, Ur: <0.2 mg/dL

## 2024-05-19 LAB — VITAMIN D 25 HYDROXY (VIT D DEFICIENCY, FRACTURES): Vit D, 25-Hydroxy: 17 ng/mL — ABNORMAL LOW (ref 30–100)

## 2024-05-24 ENCOUNTER — Other Ambulatory Visit: Payer: Self-pay | Admitting: Family Medicine

## 2024-05-24 DIAGNOSIS — I10 Essential (primary) hypertension: Secondary | ICD-10-CM

## 2024-05-24 DIAGNOSIS — R600 Localized edema: Secondary | ICD-10-CM

## 2024-05-24 DIAGNOSIS — I119 Hypertensive heart disease without heart failure: Secondary | ICD-10-CM

## 2024-05-25 ENCOUNTER — Telehealth: Payer: Self-pay | Admitting: Pharmacy Technician

## 2024-05-25 ENCOUNTER — Other Ambulatory Visit (HOSPITAL_COMMUNITY): Payer: Self-pay

## 2024-05-25 NOTE — Telephone Encounter (Signed)
 Pharmacy Patient Advocate Encounter   Received notification from CoverMyMeds that prior authorization for Praluent  75MG /ML auto-injectors is required/requested.   Insurance verification completed.   The patient is insured through [facility] .   Per test claim:  SEE LIST BELOW is preferred by the insurance.  If suggested medication is appropriate, Please send in a new RX and discontinue this one. If not, please advise as to why it's not appropriate so that we may request a Prior Authorization. Please note, some preferred medications may still require a PA.  If the suggested medications have not been trialed and there are no contraindications to their use, the PA will not be submitted, as it will not be approved.

## 2024-05-26 ENCOUNTER — Other Ambulatory Visit: Payer: Self-pay | Admitting: Family Medicine

## 2024-05-26 DIAGNOSIS — E1169 Type 2 diabetes mellitus with other specified complication: Secondary | ICD-10-CM

## 2024-05-26 MED ORDER — REPATHA 140 MG/ML ~~LOC~~ SOSY: 1.0000 | PREFILLED_SYRINGE | SUBCUTANEOUS | 3 refills | Status: DC

## 2024-05-27 ENCOUNTER — Other Ambulatory Visit (HOSPITAL_COMMUNITY): Payer: Self-pay

## 2024-05-27 NOTE — Telephone Encounter (Signed)
 Sending PA for Repatha  today

## 2024-05-27 NOTE — Telephone Encounter (Signed)
 Pharmacy Patient Advocate Encounter  Received notification from [employer] that Prior Authorization for Repatha  SureClick 140MG /ML auto-injectors has been APPROVED from 05/27/24 to 11/27/24. Ran test claim, Copay is $0.00 for 84 days. This test claim was processed through [facility]- copay amounts may vary at other pharmacies due to pharmacy/plan contracts, or as the patient moves through the different stages of their insurance plan.   PA #/Case ID/Reference #: [id number]

## 2024-05-27 NOTE — Telephone Encounter (Signed)
 Pharmacy Patient Advocate Encounter   Received notification from CoverMyMeds that prior authorization for Repatha  SureClick 140MG /ML auto-injectors is required/requested.   Insurance verification completed.   The patient is insured through [facility] .   Per test claim: PA required; PA submitted to above mentioned insurance via CoverMyMeds Key/confirmation #/[facility] [place] Status is pending

## 2024-06-02 ENCOUNTER — Other Ambulatory Visit: Payer: Self-pay | Admitting: Family Medicine

## 2024-06-02 DIAGNOSIS — G620 Drug-induced polyneuropathy: Secondary | ICD-10-CM

## 2024-06-02 DIAGNOSIS — F325 Major depressive disorder, single episode, in full remission: Secondary | ICD-10-CM

## 2024-06-06 ENCOUNTER — Ambulatory Visit: Admitting: Cardiology

## 2024-06-06 ENCOUNTER — Other Ambulatory Visit: Payer: Self-pay | Admitting: Family Medicine

## 2024-06-06 DIAGNOSIS — I119 Hypertensive heart disease without heart failure: Secondary | ICD-10-CM

## 2024-06-06 DIAGNOSIS — R600 Localized edema: Secondary | ICD-10-CM

## 2024-06-06 DIAGNOSIS — I10 Essential (primary) hypertension: Secondary | ICD-10-CM

## 2024-06-08 ENCOUNTER — Ambulatory Visit: Admitting: Family Medicine

## 2024-06-08 ENCOUNTER — Encounter: Payer: Self-pay | Admitting: Family Medicine

## 2024-06-08 VITALS — BP 130/84 | HR 97 | Temp 98.0°F | Ht 60.0 in | Wt 230.4 lb

## 2024-06-08 DIAGNOSIS — I119 Hypertensive heart disease without heart failure: Secondary | ICD-10-CM

## 2024-06-08 DIAGNOSIS — Z1231 Encounter for screening mammogram for malignant neoplasm of breast: Secondary | ICD-10-CM

## 2024-06-08 DIAGNOSIS — Z8542 Personal history of malignant neoplasm of other parts of uterus: Secondary | ICD-10-CM

## 2024-06-08 DIAGNOSIS — I43 Cardiomyopathy in diseases classified elsewhere: Secondary | ICD-10-CM

## 2024-06-08 DIAGNOSIS — I700 Atherosclerosis of aorta: Secondary | ICD-10-CM | POA: Diagnosis not present

## 2024-06-08 DIAGNOSIS — Z8673 Personal history of transient ischemic attack (TIA), and cerebral infarction without residual deficits: Secondary | ICD-10-CM

## 2024-06-08 DIAGNOSIS — K219 Gastro-esophageal reflux disease without esophagitis: Secondary | ICD-10-CM

## 2024-06-08 DIAGNOSIS — J4540 Moderate persistent asthma, uncomplicated: Secondary | ICD-10-CM | POA: Insufficient documentation

## 2024-06-08 DIAGNOSIS — M8580 Other specified disorders of bone density and structure, unspecified site: Secondary | ICD-10-CM

## 2024-06-08 DIAGNOSIS — F325 Major depressive disorder, single episode, in full remission: Secondary | ICD-10-CM

## 2024-06-08 DIAGNOSIS — G620 Drug-induced polyneuropathy: Secondary | ICD-10-CM | POA: Diagnosis not present

## 2024-06-08 DIAGNOSIS — E1159 Type 2 diabetes mellitus with other circulatory complications: Secondary | ICD-10-CM

## 2024-06-08 MED ORDER — PANTOPRAZOLE SODIUM 20 MG PO TBEC: 20.0000 mg | DELAYED_RELEASE_TABLET | Freq: Every day | ORAL | 1 refills | Status: DC

## 2024-06-08 MED ORDER — DULOXETINE HCL 60 MG PO CPEP: 60.0000 mg | ORAL_CAPSULE | Freq: Every day | ORAL | 1 refills | Status: DC

## 2024-06-08 MED ORDER — PREGABALIN 100 MG PO CAPS: 100.0000 mg | ORAL_CAPSULE | Freq: Two times a day (BID) | ORAL | 1 refills | Status: DC

## 2024-06-08 MED ORDER — OLMESARTAN MEDOXOMIL-HCTZ 20-12.5 MG PO TABS
1.0000 | ORAL_TABLET | Freq: Every morning | ORAL | 1 refills | Status: DC
Start: 2024-06-08 — End: 2024-10-11

## 2024-06-08 MED ORDER — FARXIGA 10 MG PO TABS
10.0000 mg | ORAL_TABLET | Freq: Every day | ORAL | 1 refills | Status: DC
Start: 2024-06-08 — End: 2024-08-01

## 2024-06-08 NOTE — Progress Notes (Signed)
 Name: Molly [name]   MRN: [id number]    DOB: Jul 25, 1951   Date:06/08/2024       Progress Note  Subjective  Chief Complaint  Chief Complaint  Patient presents with   Medical Management of Chronic Issues    Requesting vit d prescription   Medication Refill   Discussed the use of AI scribe software for clinical note transcription with the patient, who gave verbal consent to proceed.  History of Present Illness Molly [name] is a 73 year old female with cardiomyopathy, diabetes, and asthma who presents for a regular follow-up visit.  She has a history of cardiomyopathy due to hypertension and is experiencing palpitations. She is currently wearing a Zio patch to monitor these symptoms. She describes a 'thump' sensation a couple of days after starting the monitor but has not experienced significant palpitations since. She also mentions a 'weird feeling' in her chest once, described as 'misery' but not painful.  Her diabetes management includes taking Farxiga  10 mg daily. No significant side effects such as excessive thirst or urination, and no low blood sugar episodes. She is focusing on portion control and increasing physical activity for weight management.  She has a history of endometrial cancer and underwent chemotherapy, resulting in neuropathy. She is taking pregabalin  (Lyrica ) for neuropathy, which is effective. Her current prescription is for one pill daily, and she does not experience drowsiness from the medication.  She manages high blood pressure and hyperlipidemia with olmesartan  20/12.5 mg and Repatha , respectively. No side effects from these medications. She has a history of statin myopathy, which is why she is not on Crestor  or atorvastatin.  She has a history of atherosclerosis and a previous stroke without sequelae. She is taking Plavix  (clopidogrel ) for stroke prevention.  Her asthma is managed with Breo, and it is well controlled with no recent episodes of shortness of  breath or wheezing.  She has a history of osteopenia and is due for a mammogram and bone density scan.  She is taking duloxetine  for anxiety and major depressive disorder, which is effective. She also takes pantoprazole  daily for reflux and uses Lasix  as needed for swelling, ensuring to take potassium supplements concurrently.    Patient Active Problem List   Diagnosis Date Noted   Asthma, moderate persistent, poorly-controlled 06/08/2024   Hypertension associated with type 2 diabetes mellitus (HCC) 08/11/2023   GERD without esophagitis 08/11/2023   Major depression in remission (HCC) 04/23/2021   Morbid obesity (HCC) 04/23/2021   Peripheral neuropathy due to chemotherapy (HCC) 04/23/2021   Myalgia 03/20/2021   Iron deficiency anemia 11/15/2018   Endometrial adenocarcinoma (HCC) 10/29/2018   Umbilical hernia without obstruction and without gangrene    Atherosclerosis of right coronary artery 11/10/2017   Atherosclerosis of abdominal aorta (HCC) 11/10/2017   Perennial allergic rhinitis with seasonal variation 05/14/2016   Asthma, mild intermittent, well-controlled 05/14/2016   Cardiomyopathy due to hypertension (HCC) 12/10/2015   History of CVA (cerebrovascular accident) without residual deficits 12/10/2015   Hyperlipidemia LDL goal <70 12/10/2015   Statin intolerance 12/10/2015    Past Surgical History:  Procedure Laterality Date   ABDOMINAL HYSTERECTOMY     CHOLECYSTECTOMY     COLONOSCOPY WITH PROPOFOL  N/A 01/08/2017   Procedure: COLONOSCOPY WITH PROPOFOL ;  Surgeon: Luke Salaam, MD;  Location: [facility] ENDOSCOPY;  Service: Endoscopy;  Laterality: N/A;   COLONOSCOPY WITH PROPOFOL  N/A 05/05/2018   Procedure: COLONOSCOPY WITH PROPOFOL ;  Surgeon: Luke Salaam, MD;  Location: [facility] ENDOSCOPY;  Service: Gastroenterology;  Laterality:  N/A;   COLONOSCOPY WITH PROPOFOL  N/A 07/15/2021   Procedure: COLONOSCOPY WITH PROPOFOL ;  Surgeon: Luke Salaam, MD;  Location: [facility] ENDOSCOPY;  Service:  Gastroenterology;  Laterality: N/A;   HERNIA REPAIR  10/2017   umbilical   PORTACATH PLACEMENT N/A 11/04/2018   Procedure: INSERTION PORT-A-CATH;  Surgeon: Alben Alma, MD;  Location: [facility] ORS;  Service: General;  Laterality: N/A;   SENTINEL NODE BIOPSY N/A 10/06/2018   Procedure: SENTINEL NODE BIOPSY;  Surgeon: Hermine Loots, MD;  Location: [facility] ORS;  Service: Gynecology;  Laterality: N/A;   UMBILICAL HERNIA REPAIR N/A 11/17/2017   Procedure: HERNIA REPAIR UMBILICAL ADULT;  Surgeon: Alben Alma, MD;  Location: [facility] ORS;  Service: General;  Laterality: N/A;    Family History  Problem Relation Age of Onset   Stroke Mother    Hypertension Mother    Dementia Mother    Alzheimer's disease Mother    Gout Father    Asthma Father    Hypertension Father    Dementia Father    Healthy Sister    Stroke Brother    Alzheimer's disease Brother    Healthy Daughter    Hypertension Brother    Healthy Brother    Cancer Paternal Grandmother    Healthy Sister    Healthy Sister    Hypertension Sister    Hypertension Sister    Stroke Brother    Alzheimer's disease Brother    Stroke Brother    Hypertension Brother    Stroke Brother    Hypertension Brother    Healthy Brother    Healthy Brother    Hypertension Daughter    Breast cancer Neg Hx     Social History   Tobacco Use   Smoking status: Never   Smokeless tobacco: Never   Tobacco comments:    smoking cessation materials not required  Substance Use Topics   Alcohol use: No    Alcohol/week: 0.0 standard drinks of alcohol     Current Outpatient Medications:    albuterol  (VENTOLIN  HFA) 108 (90 Base) MCG/ACT inhaler, Inhale 2 puffs into the lungs every 4 (four) hours as needed for wheezing or shortness of breath., Disp: 1 each, Rfl: 1   BREO ELLIPTA  100-25 MCG/ACT AEPB, INHALE 1 PUFF BY MOUTH ONCE DAILY, Disp: 60 each, Rfl: 10   budesonide  (PULMICORT ) 0.5 MG/2ML nebulizer solution, Take 2 mLs (0.5 mg total) by nebulization 2  (two) times daily as needed (when having asthma exacerbation). Rinse out mouth after use and spit, Disp: 60 mL, Rfl: 0   Calcium  Carb-Cholecalciferol (CALCIUM  1000 + D PO), Take by mouth., Disp: , Rfl:    cetirizine  (ZYRTEC  ALLERGY) 10 MG tablet, Take 1 tablet (10 mg total) by mouth daily., Disp: 30 tablet, Rfl: 1   clopidogrel  (PLAVIX ) 75 MG tablet, Take 75 mg by mouth daily., Disp: , Rfl:    diclofenac  Sodium (VOLTAREN ) 1 % GEL, Apply 4 grams to the skin 4 times daily. Need appt., Disp: 300 g, Rfl: 0   furosemide  (LASIX ) 40 MG tablet, TAKE 1 TABLET BY MOUTH DAILY AS NEEDED, Disp: 30 tablet, Rfl: 0   ipratropium-albuterol  (DUONEB) 0.5-2.5 (3) MG/3ML SOLN, Take 3 mLs by nebulization every 6 (six) hours as needed., Disp: 180 mL, Rfl: 1   ketoconazole  (NIZORAL ) 2 % cream, APPLY TO THE AFFECTED AREA(S) of abdominal fold ONCE daily AS NEEDED, Disp: 60 g, Rfl: 0   potassium chloride  SA (KLOR-CON  M) 20 MEQ tablet, Take 20 mEq by mouth 2 (two) times daily.,  Disp: , Rfl:    DULoxetine  (CYMBALTA ) 60 MG capsule, Take 1 capsule (60 mg total) by mouth daily., Disp: 90 capsule, Rfl: 1   Evolocumab  (REPATHA ) 140 MG/ML SOSY, Inject 140 mg into the skin every 14 (fourteen) days. (Patient not taking: Reported on 06/08/2024), Disp: 6.3 mL, Rfl: 3   FARXIGA  10 MG TABS tablet, Take 1 tablet (10 mg total) by mouth daily before breakfast., Disp: 90 tablet, Rfl: 1   olmesartan -hydrochlorothiazide  (BENICAR  HCT) 20-12.5 MG tablet, Take 1 tablet by mouth every morning., Disp: 90 tablet, Rfl: 1   pantoprazole  (PROTONIX ) 20 MG tablet, Take 1 tablet (20 mg total) by mouth daily., Disp: 90 tablet, Rfl: 1   pregabalin  (LYRICA ) 100 MG capsule, Take 1 capsule (100 mg total) by mouth 2 (two) times daily., Disp: 180 capsule, Rfl: 1  Allergies  Allergen Reactions   Ferumoxytol  Anaphylaxis   Liraglutide  Other (See Comments)    pancreatitis   Nsaids Hives, Rash and Nausea And Vomiting   Clopidogrel  Other (See Comments)    Reports  intolerance and states causes myalgias   Statins Other (See Comments) and Nausea And Vomiting    Joint pains   Oxycodone  Nausea And Vomiting   Crestor  [Rosuvastatin  Calcium ] Rash   Zetia  [Ezetimibe ] Other (See Comments)    Muscle weakness and joint pain    I personally reviewed active problem list, medication list, allergies with the patient/caregiver today.   ROS  Ten systems reviewed and is negative except as mentioned in HPI    Objective Physical Exam Constitutional: Patient appears well-developed and well-nourished. Obese  No distress.  HEENT: head atraumatic, normocephalic, pupils equal and reactive to light, neck supple, Cardiovascular: Normal rate, regular rhythm and normal heart sounds.  No murmur heard. Trace BLE edema. Pulmonary/Chest: Effort normal and breath sounds normal. No respiratory distress. Abdominal: Soft.  There is no tenderness. Psychiatric: Patient has a normal mood and affect. behavior is normal. Judgment and thought content normal.      Vitals:   06/08/24 0943  BP: 130/84  Pulse: 97  Temp: 98 F (36.7 C)  SpO2: 95%  Weight: 230 lb 6.4 oz (104.5 kg)  Height: 5' (1.524 m)    Body mass index is 45 kg/m.  Recent Results (from the past 2160 hours)  Fecal occult blood, imunochemical     Status: None   Collection Time: 04/27/24 12:00 AM  Result Value Ref Range   IFOBT Negative     Comment: Letsgetchecked  Basic metabolic panel with GFR     Status: None   Collection Time: 04/27/24 12:00 AM  Result Value Ref Range   Creatinine 0.6 0.5 - 1.1  Comprehensive metabolic panel with GFR     Status: None   Collection Time: 04/27/24 12:00 AM  Result Value Ref Range   eGFR 90.001   CBC with Differential/Platelet     Status: None   Collection Time: 05/18/24 11:14 AM  Result Value Ref Range   WBC 4.8 3.8 - 10.8 Thousand/uL   RBC 4.15 3.80 - 5.10 Million/uL   Hemoglobin 12.6 11.7 - 15.5 g/dL   HCT 86.5 78.4 - 69.6 %   MCV 92.5 80.0 - 100.0 fL   MCH  30.4 27.0 - 33.0 pg   MCHC 32.8 32.0 - 36.0 g/dL    Comment: For adults, a slight decrease in the calculated MCHC value (in the range of 30 to 32 g/dL) is most likely not clinically significant; however, it should be interpreted with caution in correlation  with other red cell parameters and the patient's clinical condition.    RDW 13.3 11.0 - 15.0 %   Platelets 288 140 - 400 Thousand/uL   MPV 9.9 7.5 - 12.5 fL   Neutro Abs 2,578 1,500 - 7,800 cells/uL   Absolute Lymphocytes 1,718 850 - 3,900 cells/uL   Absolute Monocytes 408 200 - 950 cells/uL   Eosinophils Absolute 58 15 - 500 cells/uL   Basophils Absolute 38 0 - 200 cells/uL   Neutrophils Relative % 53.7 %   Total Lymphocyte 35.8 %   Monocytes Relative 8.5 %   Eosinophils Relative 1.2 %   Basophils Relative 0.8 %  Comprehensive metabolic panel with GFR     Status: Abnormal   Collection Time: 05/18/24 11:14 AM  Result Value Ref Range   Glucose, Bld 110 (H) 65 - 99 mg/dL    Comment: .            Fasting reference interval . For someone without known diabetes, a glucose value between 100 and 125 mg/dL is consistent with prediabetes and should be confirmed with a follow-up test. .    BUN 13 7 - 25 mg/dL   Creat 1.61 0.96 - 0.45 mg/dL   eGFR 81 > OR = 60 WU/JWJ/1.91Y7   BUN/Creatinine Ratio SEE NOTE: 6 - 22 (calc)    Comment:    Not Reported: BUN and Creatinine are within    reference range. .    Sodium 140 135 - 146 mmol/L   Potassium 4.1 3.5 - 5.3 mmol/L   Chloride 101 98 - 110 mmol/L   CO2 31 20 - 32 mmol/L   Calcium  9.6 8.6 - 10.4 mg/dL   Total Protein 6.7 6.1 - 8.1 g/dL   Albumin 4.1 3.6 - 5.1 g/dL   Globulin 2.6 1.9 - 3.7 g/dL (calc)   AG Ratio 1.6 1.0 - 2.5 (calc)   Total Bilirubin 0.6 0.2 - 1.2 mg/dL   Alkaline phosphatase (APISO) 73 37 - 153 U/L   AST 15 10 - 35 U/L   ALT 9 6 - 29 U/L  Hemoglobin A1c     Status: Abnormal   Collection Time: 05/18/24 11:14 AM  Result Value Ref Range   Hgb A1c MFr Bld 6.7  (H) <5.7 %    Comment: For someone without known diabetes, a hemoglobin A1c value of 6.5% or greater indicates that they may have  diabetes and this should be confirmed with a follow-up  test. . For someone with known diabetes, a value <7% indicates  that their diabetes is well controlled and a value  greater than or equal to 7% indicates suboptimal  control. A1c targets should be individualized based on  duration of diabetes, age, comorbid conditions, and  other considerations. . Currently, no consensus exists regarding use of hemoglobin A1c for diagnosis of diabetes for children. .    Mean Plasma Glucose 146 mg/dL   eAG (mmol/L) 8.1 mmol/L  Lipid panel     Status: Abnormal   Collection Time: 05/18/24 11:14 AM  Result Value Ref Range   Cholesterol 234 (H) <200 mg/dL   HDL 60 > OR = 50 mg/dL   Triglycerides 829 <562 mg/dL   LDL Cholesterol (Calc) 148 (H) mg/dL (calc)    Comment: Reference range: <100 . Desirable range <100 mg/dL for primary prevention;   <70 mg/dL for patients with CHD or diabetic patients  with > or = 2 CHD risk factors. Aaron Aas LDL-C is now calculated using the Martin-Hopkins  calculation, which is a validated novel method providing  better accuracy than the Friedewald equation in the  estimation of LDL-C.  Melinda Sprawls et al. Erroll Heard. 8295;621(30): 2061-2068  (http://education.[facility].com/faq/FAQ164)    Total CHOL/HDL Ratio 3.9 <5.0 (calc)   Non-HDL Cholesterol (Calc) 174 (H) <130 mg/dL (calc)    Comment: For patients with diabetes plus 1 major ASCVD risk  factor, treating to a non-HDL-C goal of <100 mg/dL  (LDL-C of <86 mg/dL) is considered a therapeutic  option.   TSH     Status: None   Collection Time: 05/18/24 11:14 AM  Result Value Ref Range   TSH 1.04 0.40 - 4.50 mIU/L  B12 and Folate Panel     Status: None   Collection Time: 05/18/24 11:14 AM  Result Value Ref Range   Vitamin B-12 453 200 - 1,100 pg/mL   Folate 11.0 ng/mL    Comment:                             Reference Range                            Low:           <3.4                            Borderline:    3.4-5.4                            Normal:        >5.4 .   VITAMIN D  25 Hydroxy (Vit-D Deficiency, Fractures)     Status: Abnormal   Collection Time: 05/18/24 11:14 AM  Result Value Ref Range   Vit D, 25-Hydroxy 17 (L) 30 - 100 ng/mL    Comment: Vitamin D  Status         25-OH Vitamin D : . Deficiency:                    <20 ng/mL Insufficiency:             20 - 29 ng/mL Optimal:                 > or = 30 ng/mL . For 25-OH Vitamin D  testing on patients on  D2-supplementation and patients for whom quantitation  of D2 and D3 fractions is required, the [facility](TM) 25-OH VIT D, (D2,D3), LC/MS/MS is recommended: order  code 57846 (patients >18yrs). . See Note 1 . Note 1 . For additional information, please refer to  http://education.[facility].com/faq/FAQ199  (This link is being provided for informational/ educational purposes only.)   Urine Microalbumin w/creat. ratio     Status: None   Collection Time: 05/18/24 11:14 AM  Result Value Ref Range   Creatinine, Urine 92 20 - 275 mg/dL   Microalb, Ur <9.6 mg/dL    Comment: Reference Range Not established    Microalb Creat Ratio NOTE <30 mg/g creat    Comment: NOTE: The urine albumin value is less than  0.2 mg/dL therefore we are unable to calculate  excretion and/or creatinine ratio. . The ADA defines abnormalities in albumin excretion as follows: Aaron Aas Albuminuria Category        Result (mg/g creatinine) . Normal to Mildly increased   <30 Moderately increased  30-299  Severely increased           > OR = 300 . The ADA recommends that at least two of three specimens collected within a 3-6 month period be abnormal before considering a patient to be within a diagnostic category.     Diabetic Foot Exam:  Diabetic foot exam was performed with the following findings:   No  deformities, ulcerations, or other skin breakdown Normal sensation of 10g monofilament Intact posterior tibialis and dorsalis pedis pulses      PHQ2/9:    06/08/2024    9:50 AM 05/18/2024   10:38 AM 02/22/2024    9:48 AM 01/19/2024    2:20 PM 01/11/2024    1:54 PM  Depression screen PHQ 2/9  Decreased Interest 0 0 0 0 0  Down, Depressed, Hopeless 0 0 0 0 0  PHQ - 2 Score 0 0 0 0 0  Altered sleeping 0 0  0   Tired, decreased energy 0 0  0   Change in appetite 0 0  0   Feeling bad or failure about yourself  0 0  0   Trouble concentrating 0 0  0   Moving slowly or fidgety/restless 0 0  0   Suicidal thoughts 0 0  0   PHQ-9 Score 0 0  0   Difficult doing work/chores Not difficult at all Not difficult at all  Not difficult at all     phq 9 is negative  Fall Risk:    06/08/2024    9:48 AM 05/18/2024   10:33 AM 03/15/2024   10:57 AM 02/22/2024   10:14 AM 01/19/2024    2:20 PM  Fall Risk   Falls in the past year? 0 0 0 1 0  Number falls in past yr: 0 0 0 0 0  Injury with Fall? 0 0 0 0 0  Risk for fall due to :  No Fall Risks Medication side effect Medication side effect;Other (Comment) No Fall Risks  Risk for fall due to: Comment    History of Joint Pain   Follow up  Falls prevention discussed;Education provided;Falls evaluation completed Falls prevention discussed Falls prevention discussed Falls prevention discussed;Education provided;Falls evaluation completed      Assessment & Plan Fatigue Fatigue improved with dietary changes and medication timing, stabilizing blood glucose. - Continue current medication regimen. - Monitor and report any symptom changes.  Cardiomyopathy Cardiomyopathy secondary to hypertension managed with Farxiga , improving symptoms. - Continue Farxiga . - Monitor for heart failure symptoms.  Type 2 Diabetes Mellitus Diabetes managed with Farxiga , no significant side effects, improved symptom control. - Continue Farxiga . - Regularly monitor blood  glucose levels.  Hypertension Hypertension well-controlled with olmesartan  HCTZ, crucial due to stroke and atherosclerosis history. - Continue olmesartan  HCTZ. - Regularly monitor blood pressure.  Atherosclerosis Atherosclerosis managed by controlling blood pressure and cholesterol to prevent cardiovascular events. - Continue current cardiovascular medications. - Monitor for cardiovascular symptoms.  Dyslipidemia Dyslipidemia managed with Repatha  due to statin intolerance. - Ensure Repatha  prescription is filled and taken as directed. - Monitor cholesterol levels.  Asthma Moderate persistent asthma well-controlled with Breo. - Continue Breo as prescribed. - Monitor for asthma symptoms.  Peripheral Neuropathy - secondary to chemotherapy  Peripheral neuropathy managed with Lyrica , dosage adjustment expected to improve control. - Prescribe Lyrica  (pregabalin ) twice daily. - Monitor for symptom control.  Gastroesophageal Reflux Disease (GERD) GERD symptoms controlled with pantoprazole . - Continue pantoprazole  daily.  Major Depressive Disorder Major depressive disorder and anxiety  well-controlled with duloxetine . - Continue duloxetine  as prescribed.  History of Endometrial Cancer Endometrial cancer treated with surgery and chemotherapy, no recurrence, ongoing surveillance advised. - Schedule annual follow-up with oncologist. - Ensure routine cancer screenings are up to date.  General Health Maintenance Regular health screenings and vaccinations advised due to increased risk from previous cancer treatment. - Schedule mammogram and bone density test. - Ensure annual flu vaccination.  Follow-up Regular follow-up appointments advised to monitor chronic conditions. - Schedule follow-up appointment in 4 months.

## 2024-07-04 ENCOUNTER — Other Ambulatory Visit: Payer: Self-pay

## 2024-07-04 NOTE — Patient Instructions (Signed)
 Thank you for allowing the Complex Care Management team to participate in your care. It was great speaking with you!  We will follow up on August 08, 2024 at 1000. Please do not hesitate to contact me if you require assistance prior to our next outreach.   Jackson Acron [facility] Population Health RN Care Manager Direct Dial: [phone]  Fax: [phone] Website: [email or web address]

## 2024-07-04 NOTE — Patient Outreach (Signed)
 Complex Care Management   Visit Note  07/04/2024  Name:  Molly [name] MRN: [id number] DOB: 04-27-1951  Situation: Referral received for Complex Care Management related to Asthma, Hyperlipidemia and Prediabetes. I obtained verbal consent from Patient.  Visit completed with Ms. Kroeker via telephone.  Background:   Past Medical History:  Diagnosis Date   Anxiety    Asthma    history of asthma   Cardiomyopathy (HCC)    Complication of anesthesia    tore hair out and made teeth rough   COVID-19 virus infection 09/08/2019   Depression    Endometrial cancer (HCC) 08/2018   GERD (gastroesophageal reflux disease)    takes prilosec prn   History of CVA (cerebrovascular accident) 12/10/2015   Hyperlipidemia LDL goal <70 12/10/2015   Hypertension    Prediabetes 10/02/2017   A1c 6 in January 2018   Stroke [facility]) 1990    no residual effects    Assessment: Patient Reported Symptoms: Cognitive Cognitive Status: Alert and oriented to person, place, and time, Normal speech and language skills Cognitive/Intellectual Conditions Management [RPT]: None reported or documented in medical history or problem list Health Maintenance Behaviors: Annual physical exam, Social activities, Spiritual practice(s) Healing Pattern: Average Health Facilitated by: Rest  Neurological Neurological Review of Symptoms: No symptoms reported Neurological Management Strategies: Routine screening Neurological Self-Management Outcome: 4 (good)  HEENT HEENT Symptoms Reported: No symptoms reported HEENT Management Strategies: Routine screening HEENT Self-Management Outcome: 4 (good)  Cardiovascular Cardiovascular Symptoms Reported: No symptoms reported Does patient have uncontrolled Hypertension?: No Cardiovascular Management Strategies: Medication therapy, Routine screening, Diet modification Cardiovascular Self-Management Outcome: 4 (good)  Respiratory Respiratory Symptoms Reported: No symptoms reported Respiratory  Management Strategies: Medication therapy, Asthma action plan, Adequate rest, Coping strategies, Breathing techniques, Routine screening, Spacer/mask Respiratory Self-Management Outcome: 4 (good)  Endocrine Endocrine Symptoms Reported: No symptoms reported Is patient diabetic?: Yes Is patient checking blood sugars at home?: No (Reports plan to start since locating glucometer) Endocrine Self-Management Outcome: 4 (good)  Gastrointestinal Gastrointestinal Symptoms Reported: No symptoms reported Gastrointestinal Self-Management Outcome: 4 (good)  Genitourinary Genitourinary Symptoms Reported: No symptoms reported Genitourinary Self-Management Outcome: 4 (good)  Integumentary Integumentary Symptoms Reported: No symptoms reported Skin Management Strategies: Routine screening Skin Self-Management Outcome: 4 (good)  Musculoskeletal Musculoskelatal Symptoms Reviewed: No symptoms reported Musculoskeletal Management Strategies: Routine screening Musculoskeletal Self-Management Outcome: 4 (good) Falls in the past year?: No Number of falls in past year: 1 or less Was there an injury with Fall?: No Fall Risk Category Calculator: 0 Patient Fall Risk Level: Low Fall Risk Patient at Risk for Falls Due to: Medication side effect Fall risk Follow up: Falls evaluation completed  Psychosocial Psychosocial Symptoms Reported: No symptoms reported Behavioral Management Strategies: Medication therapy, Coping strategies Behavioral Health Self-Management Outcome: 4 (good) Major Change/Loss/Stressor/Fears (CP): Denies Techniques to Cope with Loss/Stress/Change: Spiritual practice(s), Medication Quality of Family Relationships: supportive Do you feel physically threatened by others?: No      07/04/2024   12:04 PM  Depression screen PHQ 2/9  Decreased Interest 0  Down, Depressed, Hopeless 0  PHQ - 2 Score 0    There were no vitals filed for this visit.  Medications Reviewed Today     Reviewed by Karoline Lima, RN (Registered Nurse) on 07/04/24 at 1023  Med List Status: <None>   Medication Order Taking? Sig Documenting Provider Last Dose Status Informant  albuterol  (VENTOLIN  HFA) 108 (90 Base) MCG/ACT inhaler [id number]  Inhale 2 puffs into the lungs every 4 (four) hours as  needed for wheezing or shortness of breath. Ward, Josette SAILOR, DO  Active   BREO ELLIPTA  100-25 MCG/ACT AEPB [id number]  INHALE 1 PUFF BY MOUTH ONCE DAILY Sowles, Krichna, MD  Active   budesonide  (PULMICORT ) 0.5 MG/2ML nebulizer solution [id number]  Take 2 mLs (0.5 mg total) by nebulization 2 (two) times daily as needed (when having asthma exacerbation). Rinse out mouth after use and spit Tapia, Leisa, PA-C  Active   Calcium  Carb-Cholecalciferol (CALCIUM  1000 + D PO) [id number]  Take by mouth. [provider]  Active Self  cetirizine  (ZYRTEC  ALLERGY) 10 MG tablet [id number]  Take 1 tablet (10 mg total) by mouth daily. Gareth Mliss FALCON, FNP  Active            Med Note [facility], Verlyn Dannenberg N   Tue Jan 05, 2024 11:54 AM)    clopidogrel  (PLAVIX ) 75 MG tablet [id number]  Take 75 mg by mouth daily. [provider]  Active   diclofenac  Sodium (VOLTAREN ) 1 % GEL [id number]  Apply 4 grams to the skin 4 times daily. Need appt. Sowles, Krichna, MD  Active   DULoxetine  (CYMBALTA ) 60 MG capsule [id number]  Take 1 capsule (60 mg total) by mouth daily. Sowles, Krichna, MD  Active   Evolocumab  (REPATHA ) 140 MG/ML SOSY [id number]  Inject 140 mg into the skin every 14 (fourteen) days.  Patient not taking: Reported on 06/08/2024   Sowles, Krichna, MD  Active   FARXIGA  10 MG TABS tablet [id number]  Take 1 tablet (10 mg total) by mouth daily before breakfast. Sowles, Krichna, MD  Active   furosemide  (LASIX ) 40 MG tablet [id number]  TAKE 1 TABLET BY MOUTH DAILY AS NEEDED Glenard, Krichna, MD  Active   ipratropium-albuterol  (DUONEB) 0.5-2.5 (3) MG/3ML SOLN [id number]  Take 3 mLs by nebulization every 6 (six) hours as needed. Tapia, Leisa, PA-C  Active    ketoconazole  (NIZORAL ) 2 % cream [id number]  APPLY TO THE AFFECTED AREA(S) of abdominal fold ONCE daily AS NEEDED Sowles, Krichna, MD  Active   olmesartan -hydrochlorothiazide  (BENICAR  HCT) 20-12.5 MG tablet [id number]  Take 1 tablet by mouth every morning. Sowles, Krichna, MD  Active   pantoprazole  (PROTONIX ) 20 MG tablet [id number]  Take 1 tablet (20 mg total) by mouth daily. Sowles, Krichna, MD  Active   potassium chloride  SA (KLOR-CON  M) 20 MEQ tablet [id number]  Take 20 mEq by mouth 2 (two) times daily. [provider]  Active   pregabalin  (LYRICA ) 100 MG capsule [id number]  Take 1 capsule (100 mg total) by mouth 2 (two) times daily. Sowles, Krichna, MD  Active             Recommendation:   Continue Current Plan of Care  Follow Up Plan:   Telephone follow up appointment with Nurse Case Manager on August 08, 2024   Jackson Acron [facility] RN Care Manager Direct Dial: [phone]  Fax: [phone] Website: [email or web address]

## 2024-08-01 ENCOUNTER — Telehealth: Payer: Self-pay | Admitting: Pulmonary Disease

## 2024-08-01 ENCOUNTER — Ambulatory Visit: Attending: Cardiology | Admitting: Cardiology

## 2024-08-01 VITALS — BP 160/82 | HR 82 | Ht 59.0 in | Wt 234.2 lb

## 2024-08-01 DIAGNOSIS — I10 Essential (primary) hypertension: Secondary | ICD-10-CM

## 2024-08-01 DIAGNOSIS — R002 Palpitations: Secondary | ICD-10-CM

## 2024-08-01 DIAGNOSIS — E7800 Pure hypercholesterolemia, unspecified: Secondary | ICD-10-CM | POA: Diagnosis not present

## 2024-08-01 DIAGNOSIS — R5383 Other fatigue: Secondary | ICD-10-CM

## 2024-08-01 DIAGNOSIS — R051 Acute cough: Secondary | ICD-10-CM

## 2024-08-01 MED ORDER — AZITHROMYCIN 250 MG PO TABS: ORAL_TABLET | ORAL | 0 refills | Status: DC

## 2024-08-01 NOTE — Patient Instructions (Signed)
 Medication Instructions:  - START z pak for cough - STOP farxiga  *If you need a refill on your cardiac medications before your next appointment, please call your pharmacy*  Lab Work: No labs ordered today  If you have labs (blood work) drawn today and your tests are completely normal, you will receive your results only by: MyChart Message (if you have MyChart) OR A paper copy in the mail If you have any lab test that is abnormal or we need to change your treatment, we will call you to review the results.  Testing/Procedures: No test ordered today   Follow-Up: At [facility] you and your health needs are our priority.  As part of our continuing mission to provide you with exceptional heart care, our providers are all part of one team.  This team includes your primary Cardiologist (physician) and Advanced Practice Providers or APPs (Physician Assistants and Nurse Practitioners) who all work together to provide you with the care you need, when you need it.  Your next appointment:   2 month(s)  Provider:   You may see Dr. Darliss or one of the following Advanced Practice Providers on your designated Care Team:   Lonni Meager, NP Lesley Maffucci, PA-C Bernardino Bring, PA-C Cadence [place], PA-C Tylene Lunch, NP Barnie Hila, NP    We recommend signing up for the patient portal called MyChart.  Sign up information is provided on this After Visit Summary.  MyChart is used to connect with patients for Virtual Visits (Telemedicine).  Patients are able to view lab/test results, encounter notes, upcoming appointments, etc.  Non-urgent messages can be sent to your provider as well.   To learn more about what you can do with MyChart, go to [email or web address].

## 2024-08-01 NOTE — Progress Notes (Signed)
 Cardiology Office Note:    Date:  08/01/2024   ID:  Molly [name], DOB [birth year], MRN [id number]  PCP:  Glenard Mire, MD  [facility] Cardiologist:  None  [facility] Electrophysiologist:  None   Referring MD: Sowles, Krichna, MD   Chief Complaint  Patient presents with   Fatigue    Patient states that she would give out. Patient states that she weak in her chest because she coughed all night. Patient states that she feels tired.  Meds reviewed.     History of Present Illness:    Molly [name] is a 73 y.o. female with a hx of hypertension, hyperlipidemia, CVA (in 1995, no residual deficits), asthma, who presents for follow-up.   Started on Farxiga  about 3 months ago due to history of diabetes.  Patient states feeling nauseous and fatigue since starting Farxiga .  Had some palpitations.  Cardiac monitor placed by primary care physician, currently pending results.  Compliant with Plavix , Repatha  as prescribed.  Developed a cold, nasal congestion, has been coughing over the past week or so.  Denies fevers.  BP at home usually well-controlled.  Has a history of asthma, also endorses occasional snoring.   Prior notes  Echocardiogram 12/2023 EF 50 to 55% Cardiac monitor 10/2020 no evidence for A-fib or flutter. History of aphasia, patient taking 2 the ED in [place] .  Patient also had some aphasia at time of EMS arrival.  Upon arrival at the ED, patient states symptoms have improved but had a little stuttering in her speech.  TPA was recommended by neurology but patient declined.  MRI brain with no acute intracranial findings.  CT head showed old infarcts.  Carotid Doppler with no significant plaque.   She has history of statin allergies, was started on Repatha  2 days ago by PCP.  She does not take aspirin due to NSAID allergies.     Past Medical History:  Diagnosis Date   Anxiety    Asthma    history of asthma   Cardiomyopathy (HCC)    Complication of anesthesia     tore hair out and made teeth rough   COVID-19 virus infection 09/08/2019   Depression    Endometrial cancer (HCC) 08/2018   GERD (gastroesophageal reflux disease)    takes prilosec prn   History of CVA (cerebrovascular accident) 12/10/2015   Hyperlipidemia LDL goal <70 12/10/2015   Hypertension    Prediabetes 10/02/2017   A1c 6 in January 2018   Stroke [facility]) 1990    no residual effects    Past Surgical History:  Procedure Laterality Date   ABDOMINAL HYSTERECTOMY     CHOLECYSTECTOMY     COLONOSCOPY WITH PROPOFOL  N/A 01/08/2017   Procedure: COLONOSCOPY WITH PROPOFOL ;  Surgeon: Ruel Kung, MD;  Location: [facility] ENDOSCOPY;  Service: Endoscopy;  Laterality: N/A;   COLONOSCOPY WITH PROPOFOL  N/A 05/05/2018   Procedure: COLONOSCOPY WITH PROPOFOL ;  Surgeon: Kung Ruel, MD;  Location: [facility] ENDOSCOPY;  Service: Gastroenterology;  Laterality: N/A;   COLONOSCOPY WITH PROPOFOL  N/A 07/15/2021   Procedure: COLONOSCOPY WITH PROPOFOL ;  Surgeon: Kung Ruel, MD;  Location: [facility] ENDOSCOPY;  Service: Gastroenterology;  Laterality: N/A;   HERNIA REPAIR  10/2017   umbilical   PORTACATH PLACEMENT N/A 11/04/2018   Procedure: INSERTION PORT-A-CATH;  Surgeon: Jordis Laneta FALCON, MD;  Location: [facility] ORS;  Service: General;  Laterality: N/A;   SENTINEL NODE BIOPSY N/A 10/06/2018   Procedure: SENTINEL NODE BIOPSY;  Surgeon: Mancil Barter, MD;  Location: [facility] ORS;  Service:  Gynecology;  Laterality: N/A;   UMBILICAL HERNIA REPAIR N/A 11/17/2017   Procedure: HERNIA REPAIR UMBILICAL ADULT;  Surgeon: Jordis Laneta FALCON, MD;  Location: [facility] ORS;  Service: General;  Laterality: N/A;    Current Medications: Current Meds  Medication Sig   albuterol  (VENTOLIN  HFA) 108 (90 Base) MCG/ACT inhaler Inhale 2 puffs into the lungs every 4 (four) hours as needed for wheezing or shortness of breath.   BREO ELLIPTA  100-25 MCG/ACT AEPB INHALE 1 PUFF BY MOUTH ONCE DAILY   budesonide  (PULMICORT ) 0.5 MG/2ML nebulizer solution Take 2 mLs  (0.5 mg total) by nebulization 2 (two) times daily as needed (when having asthma exacerbation). Rinse out mouth after use and spit   Calcium  Carb-Cholecalciferol (CALCIUM  1000 + D PO) Take by mouth.   cetirizine  (ZYRTEC  ALLERGY) 10 MG tablet Take 1 tablet (10 mg total) by mouth daily.   clopidogrel  (PLAVIX ) 75 MG tablet Take 75 mg by mouth daily.   diclofenac  Sodium (VOLTAREN ) 1 % GEL Apply 4 grams to the skin 4 times daily. Need appt.   DULoxetine  (CYMBALTA ) 60 MG capsule Take 1 capsule (60 mg total) by mouth daily.   Evolocumab  (REPATHA ) 140 MG/ML SOSY Inject 140 mg into the skin every 14 (fourteen) days.   furosemide  (LASIX ) 40 MG tablet TAKE 1 TABLET BY MOUTH DAILY AS NEEDED   ipratropium-albuterol  (DUONEB) 0.5-2.5 (3) MG/3ML SOLN Take 3 mLs by nebulization every 6 (six) hours as needed.   ketoconazole  (NIZORAL ) 2 % cream APPLY TO THE AFFECTED AREA(S) of abdominal fold ONCE daily AS NEEDED   olmesartan -hydrochlorothiazide  (BENICAR  HCT) 20-12.5 MG tablet Take 1 tablet by mouth every morning.   pantoprazole  (PROTONIX ) 20 MG tablet Take 1 tablet (20 mg total) by mouth daily.   potassium chloride  SA (KLOR-CON  M) 20 MEQ tablet Take 20 mEq by mouth 2 (two) times daily.   pregabalin  (LYRICA ) 100 MG capsule Take 1 capsule (100 mg total) by mouth 2 (two) times daily.   [DISCONTINUED] azithromycin  (ZITHROMAX  Z-PAK) 250 MG tablet Take 2 tablets day one.  Take 1 tablet day 2-5   [DISCONTINUED] FARXIGA  10 MG TABS tablet Take 1 tablet (10 mg total) by mouth daily before breakfast.     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    Occupation: currently a Education officer, environmental  Tobacco Use   Smoking status: Never   Smokeless tobacco: Never   Tobacco comments:    smoking cessation materials not required  Vaping Use   Vaping status: Never Used  Substance and Sexual Activity   Alcohol use: No    Alcohol/week: 0.0 standard drinks of alcohol   Drug use: No   Sexual activity: Not Currently  Other Topics Concern   Not on file  Social History Narrative   Pt lives alone but currently staying with her daughter   Social Drivers of Corporate investment banker Strain: Low Risk  (02/22/2024)   Overall Financial Resource Strain (CARDIA)    Difficulty of Paying Living Expenses: Not very hard  Food Insecurity: No Food Insecurity (07/04/2024)   Hunger Vital Sign    Worried About Running Out  of Food in the Last Year: Never true    Ran Out of Food in the Last Year: Never true  Transportation Needs: No Transportation Needs (07/04/2024)   PRAPARE - Administrator, Civil Service (Medical): No    Lack of Transportation (Non-Medical): No  Physical Activity: Inactive (02/22/2024)   Exercise Vital Sign    Days of Exercise per Week: 0 days    Minutes of Exercise per Session: 0 min  Stress: No Stress Concern Present (02/22/2024)   [employer] of Occupational Health - Occupational Stress Questionnaire    Feeling of Stress : Not at all  Social Connections: Moderately Integrated (02/22/2024)   Social Connection and Isolation Panel    Frequency of Communication with Friends and Family: More than three times a week    Frequency of Social Gatherings with Friends and Family: More than three times a week    Attends Religious Services: More than 4 times per year    Active Member of [employer] or Organizations: Yes    Attends Engineer, structural: More than 4 times per year    Marital Status: Divorced     Family History: The patient's family history includes Alzheimer's disease in her brother, brother, and mother; Asthma in her father;  Cancer in her paternal grandmother; Dementia in her father and mother; Gout in her father; Healthy in her brother, brother, brother, daughter, sister, sister, and sister; Hypertension in her brother, brother, brother, daughter, father, mother, sister, and sister; Stroke in her brother, brother, brother, brother, and mother. There is no history of Breast cancer.  ROS:   Please see the history of present illness.     All other systems reviewed and are negative.  EKGs/Labs/Other Studies Reviewed:    The following studies were reviewed today:   EKG:  EKG not ordered today.   Recent Labs: 05/18/2024: ALT 9; BUN 13; Creat 0.78; Hemoglobin 12.6; Platelets 288; Potassium 4.1; Sodium 140; TSH 1.04  Recent Lipid Panel    Component Value Date/Time   CHOL 234 (H) 05/18/2024 1114   TRIG 133 05/18/2024 1114   HDL 60 05/18/2024 1114   CHOLHDL 3.9 05/18/2024 1114   VLDL 20 09/08/2019 1137   LDLCALC 148 (H) 05/18/2024 1114     Risk Assessment/Calculations:      Physical Exam:    VS:  BP (!) 160/82   Pulse 82   Ht 4' 11 (1.499 m)   Wt 234 lb 3.2 oz (106.2 kg)   SpO2 96%   BMI 47.30 kg/m     Wt Readings from Last 3 Encounters:  08/01/24 234 lb 3.2 oz (106.2 kg)  06/08/24 230 lb 6.4 oz (104.5 kg)  05/18/24 231 lb 11.2 oz (105.1 kg)     GEN:  Well nourished, well developed in no acute distress HEENT: Normal NECK: No JVD; No carotid bruits LYMPHATICS: No lymphadenopathy CARDIAC: RRR, no murmurs, rubs, gallops RESPIRATORY:  Clear to auscultation without rales, wheezing or rhonchi  ABDOMEN: Soft, non-tender, distended MUSCULOSKELETAL:  No edema; No deformity  SKIN: Warm and dry NEUROLOGIC:  Alert and oriented x 3 PSYCHIATRIC:  Normal affect   ASSESSMENT:    1. Acute cough   2. Palpitation   3. Primary hypertension   4. Pure hypercholesterolemia   5. Morbid obesity (HCC)   6. Other fatigue    PLAN:    In order of problems listed above:  Patient with acute cough, history  of persistent asthma.  May have an acute bronchitis.  Will order Z-Pak.  Advised to follow-up with PCP.  Will also refer to pulmonary medicine due to history of asthma. Palpitations, fatigue associated with taking Farxiga .  Hold Farxiga , monitor symptoms.  Refer to pulm as above for OSA eval.  Echo 1/25 EF 50 to 55% Hypertension, BP controlled.  Continue Benicar -HCTZ 20-12.5 mg daily. History of hyperlipidemia, history of CVA not tolerant to statins.  Continue Repatha , Plavix . Morbid obesity, low-calorie diet, weight loss advised.  Obesity could also be contributing to symptoms of fatigue.  Follow-up in 2 months  Shared Decision Making/Informed Consent       Medication Adjustments/Labs and Tests Ordered: Current medicines are reviewed at length with the patient today.  Concerns regarding medicines are outlined above.  Orders Placed This Encounter  Procedures   Ambulatory referral to Pulmonology   EKG 12-Lead   Meds ordered this encounter  Medications   DISCONTD: azithromycin  (ZITHROMAX  Z-PAK) 250 MG tablet    Sig: Take 2 tablets day one.  Take 1 tablet day 2-5    Dispense:  6 each    Refill:  0   azithromycin  (ZITHROMAX  Z-PAK) 250 MG tablet    Sig: Take 2 tablets day one. Take 1 tablet day 2-5    Dispense:  6 each    Refill:  0    Patient Instructions  Medication Instructions:  - START z pak for cough - STOP farxiga  *If you need a refill on your cardiac medications before your next appointment, please call your pharmacy*  Lab Work: No labs ordered today  If you have labs (blood work) drawn today and your tests are completely normal, you will receive your results only by: MyChart Message (if you have MyChart) OR A paper copy in the mail If you have any lab test that is abnormal or we need to change your treatment, we will call you to review the results.  Testing/Procedures: No test ordered today   Follow-Up: At [facility], you and your health needs are our  priority.  As part of our continuing mission to provide you with exceptional heart care, our providers are all part of one team.  This team includes your primary Cardiologist (physician) and Advanced Practice Providers or APPs (Physician Assistants and Nurse Practitioners) who all work together to provide you with the care you need, when you need it.  Your next appointment:   2 month(s)  Provider:   You may see Dr. Darliss or one of the following Advanced Practice Providers on your designated Care Team:   Lonni Meager, NP Lesley Maffucci, PA-C Bernardino Bring, PA-C Cadence [place], PA-C Tylene Lunch, NP Barnie Hila, NP    We recommend signing up for the patient portal called MyChart.  Sign up information is provided on this After Visit Summary.  MyChart is used to connect with patients for Virtual Visits (Telemedicine).  Patients are able to view lab/test results, encounter notes, upcoming appointments, etc.  Non-urgent messages can be sent to your provider as well.   To learn more about what you can do with MyChart, go to [email or web address].          Signed, Redell Darliss, MD  08/01/2024 12:11 PM    [facility]

## 2024-08-01 NOTE — Telephone Encounter (Signed)
 LVMTCB to schedule pulmonary consult.

## 2024-08-08 ENCOUNTER — Other Ambulatory Visit: Payer: Self-pay

## 2024-08-08 NOTE — Patient Instructions (Addendum)
 Thank you for allowing the Complex Care Management team to participate in your care. It was great speaking with you!  Reminders: Please be sure to attend your Pulmonology appointment on August 30, 2024.  We will follow up on October 14, 2024 at 1100. Please do not hesitate to contact me if you require assistance prior to our next outreach.   Jackson Acron [facility] RN Care Manager Direct Dial: [phone]  Fax: [phone] Website: [email or web address]

## 2024-08-08 NOTE — Patient Outreach (Signed)
 Complex Care Management   Visit Note  08/08/2024  Name:  Molly [name] MRN: [id number] DOB: 20-Nov-1951  Situation: Referral received for Complex Care Management related to Asthma and Hyperlipidemia.  I obtained verbal consent from Patient.  Visit completed with Molly [name] via telephone.  Background:   Past Medical History:  Diagnosis Date   Anxiety    Asthma    history of asthma   Cardiomyopathy (HCC)    Complication of anesthesia    tore hair out and made teeth rough   COVID-19 virus infection 09/08/2019   Depression    Endometrial cancer (HCC) 08/2018   GERD (gastroesophageal reflux disease)    takes prilosec prn   History of CVA (cerebrovascular accident) 12/10/2015   Hyperlipidemia LDL goal <70 12/10/2015   Hypertension    Prediabetes 10/02/2017   A1c 6 in January 2018   Stroke [facility]) 1990    no residual effects    Assessment: Patient Reported Symptoms: Cognitive Cognitive Status: Alert and oriented to person, place, and time, Normal speech and language skills Cognitive/Intellectual Conditions Management [RPT]: None reported or documented in medical history or problem list Health Maintenance Behaviors: Annual physical exam, Social activities, Spiritual practice(s) Healing Pattern: Average Health Facilitated by: Rest  Neurological Neurological Review of Symptoms: No symptoms reported Neurological Management Strategies: Routine screening Neurological Self-Management Outcome: 4 (good)  HEENT HEENT Symptoms Reported: No symptoms reported HEENT Management Strategies: Routine screening HEENT Self-Management Outcome: 4 (good)  Cardiovascular Cardiovascular Symptoms Reported: No symptoms reported Does patient have uncontrolled Hypertension?: No Cardiovascular Management Strategies: Medication therapy, Routine screening, Diet modification Cardiovascular Self-Management Outcome: 4 (good)  Respiratory Respiratory Symptoms Reported: No symptoms reported  Endocrine Endocrine  Symptoms Reported: No symptoms reported Is patient diabetic?: Yes Is patient checking blood sugars at home?: No Endocrine Self-Management Outcome: 4 (good)  Gastrointestinal Gastrointestinal Symptoms Reported: No symptoms reported Gastrointestinal Management Strategies: Medication therapy, Coping strategies Gastrointestinal Self-Management Outcome: 4 (good)  Genitourinary Genitourinary Symptoms Reported: No symptoms reported Genitourinary Self-Management Outcome: 4 (good)  Integumentary Integumentary Symptoms Reported: No symptoms reported Skin Management Strategies: Routine screening Skin Self-Management Outcome: 4 (good)  Musculoskeletal Musculoskelatal Symptoms Reviewed: No symptoms reported Musculoskeletal Management Strategies: Routine screening Musculoskeletal Self-Management Outcome: 4 (good)  Psychosocial Psychosocial Symptoms Reported: No symptoms reported      07/04/2024   12:04 PM  Depression screen PHQ 2/9  Decreased Interest 0  Down, Depressed, Hopeless 0  PHQ - 2 Score 0    There were no vitals filed for this visit.  Medications Reviewed Today     Reviewed by Karoline Lima, RN (Registered Nurse) on 08/08/24 at 1020  Med List Status: <None>   Medication Order Taking? Sig Documenting Provider Last Dose Status Informant  albuterol  (VENTOLIN  HFA) 108 (90 Base) MCG/ACT inhaler [id number]  Inhale 2 puffs into the lungs every 4 (four) hours as needed for wheezing or shortness of breath. Ward, Josette SAILOR, DO  Active   azithromycin  (ZITHROMAX  Z-PAK) 250 MG tablet [id number]  Take 2 tablets day one. Take 1 tablet day 2-5 Darliss Rogue, MD  Active   BREO ELLIPTA  100-25 MCG/ACT AEPB [id number]  INHALE 1 PUFF BY MOUTH ONCE DAILY Sowles, Krichna, MD  Active   budesonide  (PULMICORT ) 0.5 MG/2ML nebulizer solution [id number]  Take 2 mLs (0.5 mg total) by nebulization 2 (two) times daily as needed (when having asthma exacerbation). Rinse out mouth after use and spit Tapia, Leisa, PA-C   Active   Calcium  Carb-Cholecalciferol (CALCIUM  1000 + D PO) [id number]  Take  by mouth. [provider]  Active Self  cetirizine  (ZYRTEC  ALLERGY) 10 MG tablet [id number]  Take 1 tablet (10 mg total) by mouth daily. Gareth Mliss FALCON, FNP  Active            Med Note [facility], [place] [address]   Tue Jan 05, 2024 11:54 AM)    clopidogrel  (PLAVIX ) 75 MG tablet [id number]  Take 75 mg by mouth daily. [provider]  Active   diclofenac  Sodium (VOLTAREN ) 1 % GEL [id number]  Apply 4 grams to the skin 4 times daily. Need appt. Sowles, Krichna, MD  Active   DULoxetine  (CYMBALTA ) 60 MG capsule [id number]  Take 1 capsule (60 mg total) by mouth daily. Sowles, Krichna, MD  Active   Evolocumab  (REPATHA ) 140 MG/ML SOSY [id number]  Inject 140 mg into the skin every 14 (fourteen) days. Sowles, Krichna, MD  Active   furosemide  (LASIX ) 40 MG tablet [id number]  TAKE 1 TABLET BY MOUTH DAILY AS NEEDED Glenard, Krichna, MD  Active   ipratropium-albuterol  (DUONEB) 0.5-2.5 (3) MG/3ML SOLN [id number]  Take 3 mLs by nebulization every 6 (six) hours as needed. Tapia, Leisa, PA-C  Active   ketoconazole  (NIZORAL ) 2 % cream [id number]  APPLY TO THE AFFECTED AREA(S) of abdominal fold ONCE daily AS NEEDED Sowles, Krichna, MD  Active   olmesartan -hydrochlorothiazide  (BENICAR  HCT) 20-12.5 MG tablet [id number]  Take 1 tablet by mouth every morning. Sowles, Krichna, MD  Active   pantoprazole  (PROTONIX ) 20 MG tablet [id number]  Take 1 tablet (20 mg total) by mouth daily. Sowles, Krichna, MD  Active   potassium chloride  SA (KLOR-CON  M) 20 MEQ tablet [id number]  Take 20 mEq by mouth 2 (two) times daily. [provider]  Active   pregabalin  (LYRICA ) 100 MG capsule [id number]  Take 1 capsule (100 mg total) by mouth 2 (two) times daily. Sowles, Krichna, MD  Active             Recommendation:   Continue Current Plan of Care Complete Pulmonology appointment as scheduled on August 30, 2024  Follow Up Plan:   Telephone follow up  appointment with Nurse Case Manager on October 14, 2024   Jackson Acron [facility] RN Care Manager Direct Dial: [phone]  Fax: [phone] Website: [email or web address]

## 2024-08-30 ENCOUNTER — Ambulatory Visit (INDEPENDENT_AMBULATORY_CARE_PROVIDER_SITE_OTHER): Admitting: Student in an Organized Health Care Education/Training Program

## 2024-08-30 ENCOUNTER — Encounter: Payer: Self-pay | Admitting: Student in an Organized Health Care Education/Training Program

## 2024-08-30 VITALS — BP 118/70 | HR 77 | Temp 97.5°F | Ht 59.0 in | Wt 233.0 lb

## 2024-08-30 DIAGNOSIS — J4540 Moderate persistent asthma, uncomplicated: Secondary | ICD-10-CM | POA: Diagnosis not present

## 2024-08-30 DIAGNOSIS — R5383 Other fatigue: Secondary | ICD-10-CM

## 2024-08-30 DIAGNOSIS — R053 Chronic cough: Secondary | ICD-10-CM

## 2024-08-30 LAB — NITRIC OXIDE: Nitric Oxide: 11

## 2024-08-30 MED ORDER — AEROCHAMBER MV MISC: 0 refills | Status: AC

## 2024-08-30 MED ORDER — BUDESONIDE-FORMOTEROL FUMARATE 160-4.5 MCG/ACT IN AERO
2.0000 | INHALATION_SPRAY | Freq: Two times a day (BID) | RESPIRATORY_TRACT | 12 refills | Status: AC
Start: 2024-08-30 — End: ?

## 2024-08-30 NOTE — Patient Instructions (Signed)
 Today, I ordered blood work. You can get them draw at your preferred [facility] draw station. The nearest one to [facility] is at nearby [facility] ([address] [place], [place], [place] [address]).

## 2024-08-30 NOTE — Progress Notes (Unsigned)
 Synopsis: Referred in *** by Darliss Rogue, MD  Assessment & Plan:   1. Chronic cough *** - Nitric oxide   2. Asthma, moderate persistent, poorly-controlled (Primary)  Presenting for evaluation of cough as well as history of asthma in the setting of strong family history.  On physical exam today, she does not have any wheeze and her lungs are clear.  Despite this, she does report a constellation of symptoms that could be consistent with asthma.  I will confirm this with a pulmonary function test pre and postbronchodilator challenge.  I will also obtain an allergen panel and CBC with differential to assess for T-helper cell type response.  For management, I will switch her from Breo Ellipta  to Symbicort  with a spacer device for ease of use.  I will follow-up with her in 2 months to reevaluate symptoms.  Should she continue to have a cough, I would consider expanding the workup to include other causes of chronic cough.  Finally, I did review her previous CT scan of the chest from 2021 which appears to be within normal without any abnormal findings.  - Pulmonary Function Test; Future - Allergen Panel (27) + IGE - CBC with Differential/Platelet - budesonide -formoterol  (SYMBICORT ) 160-4.5 MCG/ACT inhaler; Inhale 2 puffs into the lungs in the morning and at bedtime.  Dispense: 1 each; Refill: 12 - Spacer/Aero-Holding Chambers (AEROCHAMBER MV) inhaler; Use as instructed  Dispense: 1 each; Refill: 0  3. Other fatigue *** - Home sleep test; Future   Return in about 2 months (around 10/30/2024).  I spent *** minutes caring for this patient today, including {EM billing:28027}  Belva November, MD [facility] 08/30/2024 3:06 PM    End of visit medications:  Meds ordered this encounter  Medications   budesonide -formoterol  (SYMBICORT ) 160-4.5 MCG/ACT inhaler    Sig: Inhale 2 puffs into the lungs in the morning and at bedtime.    Dispense:  1 each    Refill:  12    Spacer/Aero-Holding Chambers (AEROCHAMBER MV) inhaler    Sig: Use as instructed    Dispense:  1 each    Refill:  0     Current Outpatient Medications:    albuterol  (VENTOLIN  HFA) 108 (90 Base) MCG/ACT inhaler, Inhale 2 puffs into the lungs every 4 (four) hours as needed for wheezing or shortness of breath., Disp: 1 each, Rfl: 1   budesonide  (PULMICORT ) 0.5 MG/2ML nebulizer solution, Take 2 mLs (0.5 mg total) by nebulization 2 (two) times daily as needed (when having asthma exacerbation). Rinse out mouth after use and spit, Disp: 60 mL, Rfl: 0   budesonide -formoterol  (SYMBICORT ) 160-4.5 MCG/ACT inhaler, Inhale 2 puffs into the lungs in the morning and at bedtime., Disp: 1 each, Rfl: 12   Calcium  Carb-Cholecalciferol (CALCIUM  1000 + D PO), Take by mouth., Disp: , Rfl:    cetirizine  (ZYRTEC  ALLERGY) 10 MG tablet, Take 1 tablet (10 mg total) by mouth daily., Disp: 30 tablet, Rfl: 1   clopidogrel  (PLAVIX ) 75 MG tablet, Take 75 mg by mouth daily., Disp: , Rfl:    diclofenac  Sodium (VOLTAREN ) 1 % GEL, Apply 4 grams to the skin 4 times daily. Need appt., Disp: 300 g, Rfl: 0   DULoxetine  (CYMBALTA ) 60 MG capsule, Take 1 capsule (60 mg total) by mouth daily., Disp: 90 capsule, Rfl: 1   Evolocumab  (REPATHA ) 140 MG/ML SOSY, Inject 140 mg into the skin every 14 (fourteen) days., Disp: 6.3 mL, Rfl: 3   furosemide  (LASIX ) 40 MG tablet, TAKE 1  TABLET BY MOUTH DAILY AS NEEDED, Disp: 30 tablet, Rfl: 0   ipratropium-albuterol  (DUONEB) 0.5-2.5 (3) MG/3ML SOLN, Take 3 mLs by nebulization every 6 (six) hours as needed., Disp: 180 mL, Rfl: 1   ketoconazole  (NIZORAL ) 2 % cream, APPLY TO THE AFFECTED AREA(S) of abdominal fold ONCE daily AS NEEDED, Disp: 60 g, Rfl: 0   olmesartan -hydrochlorothiazide  (BENICAR  HCT) 20-12.5 MG tablet, Take 1 tablet by mouth every morning., Disp: 90 tablet, Rfl: 1   pantoprazole  (PROTONIX ) 20 MG tablet, Take 1 tablet (20 mg total) by mouth daily., Disp: 90 tablet, Rfl: 1   potassium  chloride SA (KLOR-CON  M) 20 MEQ tablet, Take 20 mEq by mouth 2 (two) times daily., Disp: , Rfl:    pregabalin  (LYRICA ) 100 MG capsule, Take 1 capsule (100 mg total) by mouth 2 (two) times daily., Disp: 180 capsule, Rfl: 1   Spacer/Aero-Holding Chambers (AEROCHAMBER MV) inhaler, Use as instructed, Disp: 1 each, Rfl: 0   azithromycin  (ZITHROMAX  Z-PAK) 250 MG tablet, Take 2 tablets day one. Take 1 tablet day 2-5 (Patient not taking: Reported on 08/30/2024), Disp: 6 each, Rfl: 0   Subjective:   PATIENT ID: Molly JINNY [name] GENDER: female DOB: [birth year], MRN: [id number]  Chief Complaint  Patient presents with   Cough    For months. With clear sputum. Occasional SOB and wheezing.     HPI  Patient is a pleasant 73 year old female presenting to clinic for the evaluation of cough.  Patient has been followed closely by her cardiologist Dr. Darliss when she presented with a complaint of acute cough.  This was concerning for exacerbation of her asthma for which she is referred to us .  Patient reports having had asthma for the past 2 years and has been on Breo Ellipta  for that time.  She reports symptom onset prior to her visit with her cardiologist which responded nicely to albuterol .  She also used her nebulizer yesterday with good response.  She does not have any wheezing at this point nor any cough.  She has some mild exertional dyspnea.  Patient has been on Breo Ellipta  which she feels has worked for her in the past but she does report difficulty using it.  She feels that the powder deposits in her mouth which causes her discomfort.  She is also unable to use it when she is acutely ill.  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  She denies any history of smoking.  Her daughter has dogs but she is not exposed to them all the time.  She has a strong family history of asthma in her brother.  Ancillary information including prior medications, full  medical/surgical/family/social histories, and PFTs (when available) are listed below and have been reviewed.   {PULM QUESTIONNAIRES (Optional):33196}  ROS   Objective:   Vitals:   08/30/24 1442  BP: 118/70  Pulse: 77  Temp: (!) 97.5 F (36.4 C)  SpO2: 94%  Weight: 233 lb (105.7 kg)  Height: 4' 11 (1.499 m)   94% on *** LPM *** RA BMI Readings from Last 3 Encounters:  08/30/24 47.06 kg/m  08/01/24 47.30 kg/m  06/08/24 45.00 kg/m   Wt Readings from Last 3 Encounters:  08/30/24 233 lb (105.7 kg)  08/01/24 234 lb 3.2 oz (106.2 kg)  06/08/24 230 lb 6.4 oz (104.5 kg)    Physical Exam    Ancillary Information    Past Medical History:  Diagnosis Date   Anxiety    Asthma  history of asthma   Cardiomyopathy (HCC)    Complication of anesthesia    tore hair out and made teeth rough   COVID-19 virus infection 09/08/2019   Depression    Endometrial cancer (HCC) 08/2018   GERD (gastroesophageal reflux disease)    takes prilosec prn   History of CVA (cerebrovascular accident) 12/10/2015   Hyperlipidemia LDL goal <70 12/10/2015   Hypertension    Prediabetes 10/02/2017   A1c 6 in January 2018   Stroke [facility]) 1990    no residual effects     Family History  Problem Relation Age of Onset   Stroke Mother    Hypertension Mother    Dementia Mother    Alzheimer's disease Mother    Gout Father    Asthma Father    Hypertension Father    Dementia Father    Healthy Sister    Stroke Brother    Alzheimer's disease Brother    Healthy Daughter    Hypertension Brother    Healthy Brother    Cancer Paternal Grandmother    Healthy Sister    Healthy Sister    Hypertension Sister    Hypertension Sister    Stroke Brother    Alzheimer's disease Brother    Stroke Brother    Hypertension Brother    Stroke Brother    Hypertension Brother    Healthy Brother    Healthy Brother    Hypertension Daughter    Breast cancer Neg Hx      Past Surgical History:  Procedure  Laterality Date   ABDOMINAL HYSTERECTOMY     CHOLECYSTECTOMY     COLONOSCOPY WITH PROPOFOL  N/A 01/08/2017   Procedure: COLONOSCOPY WITH PROPOFOL ;  Surgeon: Ruel Kung, MD;  Location: [facility] ENDOSCOPY;  Service: Endoscopy;  Laterality: N/A;   COLONOSCOPY WITH PROPOFOL  N/A 05/05/2018   Procedure: COLONOSCOPY WITH PROPOFOL ;  Surgeon: Kung Ruel, MD;  [facility];  Service: Gastroenterology;  Laterality: N/A;   COLONOSCOPY WITH PROPOFOL  N/A 07/15/2021   Procedure: COLONOSCOPY WITH PROPOFOL ;  Surgeon: Kung Ruel, MD;  Location: [facility] ENDOSCOPY;  Service: Gastroenterology;  Laterality: N/A;   HERNIA REPAIR  10/2017   umbilical   PORTACATH PLACEMENT N/A 11/04/2018   Procedure: INSERTION PORT-A-CATH;  Surgeon: Jordis Laneta FALCON, MD;  Location: [facility] ORS;  Service: General;  Laterality: N/A;   SENTINEL NODE BIOPSY N/A 10/06/2018   Procedure: SENTINEL NODE BIOPSY;  Surgeon: Mancil Barter, MD;  Location: [facility] ORS;  Service: Gynecology;  Laterality: N/A;   UMBILICAL HERNIA REPAIR N/A 11/17/2017   Procedure: HERNIA REPAIR UMBILICAL ADULT;  Surgeon: Jordis Laneta FALCON, MD;  Location: [facility] ORS;  Service: General;  Laterality: N/A;    Social History   Socioeconomic History   Marital status: Divorced    Spouse name: Not on file   Number of children: 2   Years of education: some college   Highest education level: 12th grade  Occupational History   Occupation: Retired    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   Occupation: currently a Education officer, environmental  Tobacco Use   Smoking status: Never   Smokeless tobacco: Never   Tobacco comments:    smoking cessation materials not required  Vaping Use   Vaping status: Never Used  Substance and Sexual Activity   Alcohol use: No    Alcohol/week: 0.0 standard drinks of alcohol   Drug use: No   Sexual activity: Not Currently  Other Topics Concern   Not on file  Social History Narrative  Pt lives alone but currently staying with her daughter   Social Drivers of  Corporate investment banker Strain: Low Risk  (02/22/2024)   Overall Financial Resource Strain (CARDIA)    Difficulty of Paying Living Expenses: Not very hard  Food Insecurity: No Food Insecurity (07/04/2024)   Hunger Vital Sign    Worried About Running Out of Food in the Last Year: Never true    Ran Out of Food in the Last Year: Never true  Transportation Needs: No Transportation Needs (07/04/2024)   PRAPARE - Administrator, Civil Service (Medical): No    Lack of Transportation (Non-Medical): No  Physical Activity: Inactive (02/22/2024)   Exercise Vital Sign    Days of Exercise per Week: 0 days    Minutes of Exercise per Session: 0 min  Stress: No Stress Concern Present (02/22/2024)   [employer] of Occupational Health - Occupational Stress Questionnaire    Feeling of Stress : Not at all  Social Connections: Moderately Integrated (02/22/2024)   Social Connection and Isolation Panel    Frequency of Communication with Friends and Family: More than three times a week    Frequency of Social Gatherings with Friends and Family: More than three times a week    Attends Religious Services: More than 4 times per year    Active Member of Clubs or Organizations: Yes    Attends Banker Meetings: More than 4 times per year    Marital Status: Divorced  Intimate Partner Violence: Not At Risk (07/04/2024)   Humiliation, Afraid, Rape, and Kick questionnaire    Fear of Current or Ex-Partner: No    Emotionally Abused: No    Physically Abused: No    Sexually Abused: No     Allergies  Allergen Reactions   Ferumoxytol  Anaphylaxis   Liraglutide  Other (See Comments)    pancreatitis   Nsaids Hives, Rash and Nausea And Vomiting   Clopidogrel  Other (See Comments)    Reports intolerance and states causes myalgias   Statins Other (See Comments) and Nausea And Vomiting    Joint pains   Oxycodone  Nausea And Vomiting   Crestor  [Rosuvastatin  Calcium ] Rash   Zetia  [Ezetimibe ] Other  (See Comments)    Muscle weakness and joint pain     CBC    Component Value Date/Time   WBC 4.8 05/18/2024 1114   RBC 4.15 05/18/2024 1114   HGB 12.6 05/18/2024 1114   HCT 38.4 05/18/2024 1114   PLT 288 05/18/2024 1114   MCV 92.5 05/18/2024 1114   MCH 30.4 05/18/2024 1114   MCHC 32.8 05/18/2024 1114   RDW 13.3 05/18/2024 1114   LYMPHSABS 1.4 02/02/2024 1851   MONOABS 0.6 02/02/2024 1851   EOSABS 58 05/18/2024 1114   BASOSABS 38 05/18/2024 1114    Pulmonary Functions Testing Results:     No data to display          Outpatient Medications Prior to Visit  Medication Sig Dispense Refill   albuterol  (VENTOLIN  HFA) 108 (90 Base) MCG/ACT inhaler Inhale 2 puffs into the lungs every 4 (four) hours as needed for wheezing or shortness of breath. 1 each 1   budesonide  (PULMICORT ) 0.5 MG/2ML nebulizer solution Take 2 mLs (0.5 mg total) by nebulization 2 (two) times daily as needed (when having asthma exacerbation). Rinse out mouth after use and spit 60 mL 0   Calcium  Carb-Cholecalciferol (CALCIUM  1000 + D PO) Take by mouth.     cetirizine  (ZYRTEC  ALLERGY) 10 MG  tablet Take 1 tablet (10 mg total) by mouth daily. 30 tablet 1   clopidogrel  (PLAVIX ) 75 MG tablet Take 75 mg by mouth daily.     diclofenac  Sodium (VOLTAREN ) 1 % GEL Apply 4 grams to the skin 4 times daily. Need appt. 300 g 0   DULoxetine  (CYMBALTA ) 60 MG capsule Take 1 capsule (60 mg total) by mouth daily. 90 capsule 1   Evolocumab  (REPATHA ) 140 MG/ML SOSY Inject 140 mg into the skin every 14 (fourteen) days. 6.3 mL 3   furosemide  (LASIX ) 40 MG tablet TAKE 1 TABLET BY MOUTH DAILY AS NEEDED 30 tablet 0   ipratropium-albuterol  (DUONEB) 0.5-2.5 (3) MG/3ML SOLN Take 3 mLs by nebulization every 6 (six) hours as needed. 180 mL 1   ketoconazole  (NIZORAL ) 2 % cream APPLY TO THE AFFECTED AREA(S) of abdominal fold ONCE daily AS NEEDED 60 g 0   olmesartan -hydrochlorothiazide  (BENICAR  HCT) 20-12.5 MG tablet Take 1 tablet by mouth every  morning. 90 tablet 1   pantoprazole  (PROTONIX ) 20 MG tablet Take 1 tablet (20 mg total) by mouth daily. 90 tablet 1   potassium chloride  SA (KLOR-CON  M) 20 MEQ tablet Take 20 mEq by mouth 2 (two) times daily.     pregabalin  (LYRICA ) 100 MG capsule Take 1 capsule (100 mg total) by mouth 2 (two) times daily. 180 capsule 1   BREO ELLIPTA  100-25 MCG/ACT AEPB INHALE 1 PUFF BY MOUTH ONCE DAILY 60 each 10   azithromycin  (ZITHROMAX  Z-PAK) 250 MG tablet Take 2 tablets day one. Take 1 tablet day 2-5 (Patient not taking: Reported on 08/30/2024) 6 each 0   No facility-administered medications prior to visit.

## 2024-08-31 DIAGNOSIS — J4540 Moderate persistent asthma, uncomplicated: Secondary | ICD-10-CM | POA: Diagnosis not present

## 2024-09-03 LAB — ALLERGEN PANEL (27) + IGE
Alternaria Alternata IgE: <0.1 kU/L
Aspergillus Fumigatus IgE: <0.1 kU/L
Bahia Grass IgE: <0.1 kU/L
Bermuda Grass IgE: <0.1 kU/L
Cat Dander IgE: <0.1 kU/L
Cedar, Mountain IgE: <0.1 kU/L
Cladosporium Herbarum IgE: <0.1 kU/L
Cocklebur IgE: <0.1 kU/L
Cockroach, American IgE: <0.1 kU/L
Common Silver Birch IgE: <0.1 kU/L
D Farinae IgE: <0.1 kU/L
D Pteronyssinus IgE: <0.1 kU/L
Dog Dander IgE: <0.1 kU/L
Elm, American IgE: <0.1 kU/L
Hickory, White IgE: <0.1 kU/L
IgE (Immunoglobulin E), Serum: 91 [IU]/mL (ref 6–495)
Johnson Grass IgE: <0.1 kU/L
Kentucky Bluegrass IgE: <0.1 kU/L
Maple/Box Elder IgE: <0.1 kU/L
Mucor Racemosus IgE: <0.1 kU/L
Oak, White IgE: <0.1 kU/L
Penicillium Chrysogen IgE: <0.1 kU/L
Pigweed, Rough IgE: <0.1 kU/L
Plantain, English IgE: <0.1 kU/L
Ragweed, Short IgE: <0.1 kU/L
Setomelanomma Rostrat: <0.1 kU/L
Timothy Grass IgE: <0.1 kU/L
White Mulberry IgE: <0.1 kU/L

## 2024-09-03 LAB — CBC WITH DIFFERENTIAL/PLATELET
Basophils Absolute: 0 x10E3/uL (ref 0.0–0.2)
Basos: 1 %
EOS (ABSOLUTE): 0 x10E3/uL (ref 0.0–0.4)
Eos: 1 %
Hematocrit: 37.7 % (ref 34.0–46.6)
Hemoglobin: 12 g/dL (ref 11.1–15.9)
Immature Grans (Abs): 0 x10E3/uL (ref 0.0–0.1)
Immature Granulocytes: 0 %
Lymphocytes Absolute: 2.1 x10E3/uL (ref 0.7–3.1)
Lymphs: 34 %
MCH: 29.9 pg (ref 26.6–33.0)
MCHC: 31.8 g/dL (ref 31.5–35.7)
MCV: 94 fL (ref 79–97)
Monocytes Absolute: 0.5 x10E3/uL (ref 0.1–0.9)
Monocytes: 8 %
Neutrophils Absolute: 3.5 x10E3/uL (ref 1.4–7.0)
Neutrophils: 56 %
Platelets: 343 x10E3/uL (ref 150–450)
RBC: 4.01 x10E6/uL (ref 3.77–5.28)
RDW: 13.1 % (ref 11.7–15.4)
WBC: 6.2 x10E3/uL (ref 3.4–10.8)

## 2024-09-14 ENCOUNTER — Ambulatory Visit: Payer: 59

## 2024-09-21 ENCOUNTER — Ambulatory Visit
Admission: RE | Admit: 2024-09-21 | Discharge: 2024-09-21 | Disposition: A | Discharge status: Home / Self Care | Source: Ambulatory Visit | Attending: Obstetrics and Gynecology | Admitting: Obstetrics and Gynecology

## 2024-09-21 ENCOUNTER — Inpatient Hospital Stay: Payer: 59 | Attending: Obstetrics and Gynecology | Admitting: Obstetrics and Gynecology

## 2024-09-21 ENCOUNTER — Encounter: Payer: Self-pay | Admitting: Obstetrics and Gynecology

## 2024-09-21 ENCOUNTER — Other Ambulatory Visit
Admission: RE | Admit: 2024-09-21 | Discharge: 2024-09-21 | Disposition: A | Discharge status: Home / Self Care | Source: Home / Self Care | Attending: Obstetrics and Gynecology | Admitting: Obstetrics and Gynecology

## 2024-09-21 VITALS — BP 147/76 | HR 65 | Temp 98.6°F | Resp 18 | Wt 236.6 lb

## 2024-09-21 DIAGNOSIS — Z8542 Personal history of malignant neoplasm of other parts of uterus: Secondary | ICD-10-CM | POA: Insufficient documentation

## 2024-09-21 DIAGNOSIS — R053 Chronic cough: Secondary | ICD-10-CM | POA: Diagnosis not present

## 2024-09-21 DIAGNOSIS — Z9079 Acquired absence of other genital organ(s): Secondary | ICD-10-CM | POA: Insufficient documentation

## 2024-09-21 DIAGNOSIS — C541 Malignant neoplasm of endometrium: Secondary | ICD-10-CM

## 2024-09-21 DIAGNOSIS — Z90722 Acquired absence of ovaries, bilateral: Secondary | ICD-10-CM | POA: Insufficient documentation

## 2024-09-21 DIAGNOSIS — Z90710 Acquired absence of both cervix and uterus: Secondary | ICD-10-CM | POA: Diagnosis not present

## 2024-09-21 NOTE — Progress Notes (Signed)
 Gynecologic Oncology Interval Visit   Referring Provider: Dr. Connell  Chief Complaint: FIGO Stage IIIc1 grade 2 endometrioid endometrial cancer  Subjective:  Molly [name] is a 73 y.o. female, initially seen in consultation for Dr. Connell, grade 2 endometrioid endometrial cancer, s/p TLH-BSO with SLN mapping and biopsies on 10/06/18 with Dr. Mancil and Dr. Connell at [facility], followed by chemotherapy and radiation with vaginal cuff boost, who returns to clinic today for continued surveillance.   CA 125 has been followed by Dr. Melanee.  02/04/2019 11.4 07/18/2019 10.2 10/05/2019 9.6 07/13/2020 10.1 01/29/21  13 07/30/21  11.  02/17/22 11 02/16/23 10.4  She continues to feel well. Neuropathy is stable. She continues lyrica .   06/17/2023 Mammogram RECOMMENDATION: Screening mammogram in one year.   BI-RADS CATEGORY  1: Negative.  02/02/2024 CXR Mild central bronchial thickening. No consolidation or collapse.   Gynecologic Oncology History:  She has history of Grade 3 uterine prolapse with Grade 1 cystocele and rectocele. She noted pessary had become dislodged after coughing d/t pneumonia and presented to Dr. Connell for evaluation 05/2018. At that time she noted post-menopausal bleeding and cramping and has history of cervical polyps. Bleeding was felt to be possibly related to pessary.   Ultrasound revealed: uterus measuring 10.2 x 5.5 x 5.7 cm, heterogenous with evidence of fibroids invading endometrium measuring 2.9 x 3.3 x 3.6 cm and two additional fibroids measuring 2.0 x 2.1 x 1.8, and 2.9 x 3.3 x 3.6. Endometrium measuring 4.7 mm. Neither ovary was visualized.   She had second episode of bleeding 08/31/18-09/04/18, felt to be heavier and more 'period like'. Endometrial biopsy was performed. Pathology: - Endometrioid adenocarcinoma, figo grade 1.   Pathology:  DIAGNOSIS:  A. UTERUS WITH CERVIX AND BILATERAL FALLOPIAN TUBES AND OVARIES; TOTAL HYSTERECTOMY WITH BILATERAL SALPINGO-OOPHORECTOMY:  -  ENDOMETRIAL ADENOCARCINOMA.  - SEE CANCER SUMMARY BELOW.  - CERVIX WITH NABOTHIAN CYSTS, CHRONIC CERVICITIS WITH SQUAMOUS METAPLASIA, AND 0.9 CM ENDOCERVICAL POLYP.  - INACTIVE BACKGROUND ENDOMETRIUM WITH ENDOMETRIAL POLYP.  - MYOMETRIUM WITH ADENOMYOSIS AND LEIOMYOMATA, LARGEST MEASURING 4.5 CM.  - UNREMARKABLE FALLOPIAN TUBES AND OVARIES.   B.  SENTINEL LYMPH NODE, LEFT OBTURATOR; EXCISION:  - METASTATIC CARCINOMA INVOLVES ONE LYMPH NODE (1/1).   C.  SENTINEL LYMPH NODE, LEFT EXTERNAL ILIAC; EXCISION:  - LYMPHOID TISSUE NOT PRESENT.  - NEGATIVE FOR MALIGNANCY.   D.  SENTINEL LYMPH NODE, RIGHT EXTERNAL ILIAC; EXCISION:  - METASTATIC CARCINOMA INVOLVES ONE OF TWO LYMPH NODES (1/2).   LVSI present and atypical washings.  Invasion 6/18 and cervix and adnexa negative.   MLH1: LOSS of protein expression  MSH2: Intact nuclear expression  MSH6: Intact nuclear expression  PMS2: LOSS of protein expression   MLH1- positive methylation HER-2 negative  Pathologic Stage: FIGO stage IIIC1 grade 2  PET CT scan showed metastatic adenopathy and retroperitoneal and external iliac group of lymph nodes in the right side.  No evidence of metastatic disease elsewhere.  She received 6 cycles of carbo-taxol  on 11/05/2018-02/25/2019. She completed radiation to pelvic and and periaortic nodes 03-31-2019-05/06/2019 with boost to vaginal cuff after 3rd cycle of chemotherapy. She completed on 06/15/2019.   She had a covid infection in September 2020. Port has been removed.   Problem List: Patient Active Problem List   Diagnosis Date Noted   Asthma, moderate persistent, poorly-controlled 06/08/2024   Hypertension associated with type 2 diabetes mellitus (HCC) 08/11/2023   GERD without esophagitis 08/11/2023   Major depression in remission 04/23/2021   Morbid obesity (  HCC) 04/23/2021   Peripheral neuropathy due to chemotherapy 04/23/2021   Myalgia 03/20/2021   Iron deficiency anemia 11/15/2018   Endometrial  adenocarcinoma (HCC) 10/29/2018   Umbilical hernia without obstruction and without gangrene    Atherosclerosis of right coronary artery 11/10/2017   Atherosclerosis of abdominal aorta 11/10/2017   Perennial allergic rhinitis with seasonal variation 05/14/2016   Asthma, mild intermittent, well-controlled 05/14/2016   Cardiomyopathy due to hypertension (HCC) 12/10/2015   History of CVA (cerebrovascular accident) without residual deficits 12/10/2015   Hyperlipidemia LDL goal <70 12/10/2015   Statin intolerance 12/10/2015    Past Medical History: Past Medical History:  Diagnosis Date   Anxiety    Asthma    history of asthma   Cardiomyopathy (HCC)    Complication of anesthesia    tore hair out and made teeth rough   COVID-19 virus infection 09/08/2019   Depression    Endometrial cancer (HCC) 08/2018   GERD (gastroesophageal reflux disease)    takes prilosec prn   History of CVA (cerebrovascular accident) 12/10/2015   Hyperlipidemia LDL goal <70 12/10/2015   Hypertension    Prediabetes 10/02/2017   A1c 6 in January 2018   Stroke [facility]) 1990    no residual effects    Past Surgical History: Past Surgical History:  Procedure Laterality Date   ABDOMINAL HYSTERECTOMY     CHOLECYSTECTOMY     COLONOSCOPY WITH PROPOFOL  N/A 01/08/2017   Procedure: COLONOSCOPY WITH PROPOFOL ;  Surgeon: Ruel Kung, MD;  Location: [facility] ENDOSCOPY;  Service: Endoscopy;  Laterality: N/A;   COLONOSCOPY WITH PROPOFOL  N/A 05/05/2018   Procedure: COLONOSCOPY WITH PROPOFOL ;  Surgeon: Kung Ruel, MD;  Location: [facility] ENDOSCOPY;  Service: Gastroenterology;  Laterality: N/A;   COLONOSCOPY WITH PROPOFOL  N/A 07/15/2021   Procedure: COLONOSCOPY WITH PROPOFOL ;  Surgeon: Kung Ruel, MD;  Location: [facility] ENDOSCOPY;  Service: Gastroenterology;  Laterality: N/A;   HERNIA REPAIR  10/2017   umbilical   PORTACATH PLACEMENT N/A 11/04/2018   Procedure: INSERTION PORT-A-CATH;  Surgeon: Jordis Laneta FALCON, MD;  Location: [facility] ORS;   Service: General;  Laterality: N/A;   SENTINEL NODE BIOPSY N/A 10/06/2018   Procedure: SENTINEL NODE BIOPSY;  Surgeon: Mancil Barter, MD;  Location: [facility] ORS;  Service: Gynecology;  Laterality: N/A;   UMBILICAL HERNIA REPAIR N/A 11/17/2017   Procedure: HERNIA REPAIR UMBILICAL ADULT;  Surgeon: Jordis Laneta FALCON, MD;  Location: [facility] ORS;  Service: General;  Laterality: N/A;   Past Gynecologic History:  [id number] Denies abnormal pap smears.  History of OCP use.  Denies STD history  OB History:  OB History  Gravida Para Term Preterm AB Living  2 2 2   2   SAB IAB Ectopic Multiple Live Births      2    # Outcome Date GA Lbr Len/2nd Weight Sex Type Anes PTL Lv  2 Term 50    F Vag-Spont   LIV  1 Term 87    F Vag-Spont   LIV    Family History: Family History  Problem Relation Age of Onset   Stroke Mother    Hypertension Mother    Dementia Mother    Alzheimer's disease Mother    Gout Father    Asthma Father    Hypertension Father    Dementia Father    Healthy Sister    Stroke Brother    Alzheimer's disease Brother    Healthy Daughter    Hypertension Brother    Healthy Brother    Cancer Paternal Grandmother  Healthy Sister    Healthy Sister    Hypertension Sister    Hypertension Sister    Stroke Brother    Alzheimer's disease Brother    Stroke Brother    Hypertension Brother    Stroke Brother    Hypertension Brother    Healthy Brother    Healthy Brother    Hypertension Daughter    Breast cancer Neg Hx    Social History: Social History   Socioeconomic History   Marital status: Divorced    Spouse name: Not on file   Number of children: 2   Years of education: some college   Highest education level: 12th grade  Occupational History   Occupation: Retired    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   Occupation: currently a Education officer, environmental  Tobacco Use   Smoking status: Never   Smokeless tobacco: Never   Tobacco comments:    smoking cessation materials not required   Vaping Use   Vaping status: Never Used  Substance and Sexual Activity   Alcohol use: No    Alcohol/week: 0.0 standard drinks of alcohol   Drug use: No   Sexual activity: Not Currently  Other Topics Concern   Not on file  Social History Narrative   Pt lives alone but currently staying with her daughter   Social Drivers of Corporate investment banker Strain: Low Risk  (02/22/2024)   Overall Financial Resource Strain (CARDIA)    Difficulty of Paying Living Expenses: Not very hard  Food Insecurity: No Food Insecurity (07/04/2024)   Hunger Vital Sign    Worried About Running Out of Food in the Last Year: Never true    Ran Out of Food in the Last Year: Never true  Transportation Needs: No Transportation Needs (07/04/2024)   PRAPARE - Administrator, Civil Service (Medical): No    Lack of Transportation (Non-Medical): No  Physical Activity: Inactive (02/22/2024)   Exercise Vital Sign    Days of Exercise per Week: 0 days    Minutes of Exercise per Session: 0 min  Stress: No Stress Concern Present (02/22/2024)   [employer] of Occupational Health - Occupational Stress Questionnaire    Feeling of Stress : Not at all  Social Connections: Moderately Integrated (02/22/2024)   Social Connection and Isolation Panel    Frequency of Communication with Friends and Family: More than three times a week    Frequency of Social Gatherings with Friends and Family: More than three times a week    Attends Religious Services: More than 4 times per year    Active Member of [employer] or Organizations: Yes    Attends Banker Meetings: More than 4 times per year    Marital Status: Divorced  Intimate Partner Violence: Not At Risk (07/04/2024)   Humiliation, Afraid, Rape, and Kick questionnaire    Fear of Current or Ex-Partner: No    Emotionally Abused: No    Physically Abused: No    Sexually Abused: No    Allergies: Allergies  Allergen Reactions   Ferumoxytol  Anaphylaxis    Liraglutide  Other (See Comments)    pancreatitis   Nsaids Hives, Rash and Nausea And Vomiting   Clopidogrel  Other (See Comments)    Reports intolerance and states causes myalgias   Statins Other (See Comments) and Nausea And Vomiting    Joint pains   Oxycodone  Nausea And Vomiting   Crestor  [Rosuvastatin  Calcium ] Rash   Zetia  [Ezetimibe ] Other (See Comments)  Muscle weakness and joint pain    Current Medications: Current Outpatient Medications  Medication Sig Dispense Refill   albuterol  (VENTOLIN  HFA) 108 (90 Base) MCG/ACT inhaler Inhale 2 puffs into the lungs every 4 (four) hours as needed for wheezing or shortness of breath. 1 each 1   budesonide  (PULMICORT ) 0.5 MG/2ML nebulizer solution Take 2 mLs (0.5 mg total) by nebulization 2 (two) times daily as needed (when having asthma exacerbation). Rinse out mouth after use and spit 60 mL 0   budesonide -formoterol  (SYMBICORT ) 160-4.5 MCG/ACT inhaler Inhale 2 puffs into the lungs in the morning and at bedtime. 1 each 12   Calcium  Carb-Cholecalciferol (CALCIUM  1000 + D PO) Take by mouth.     cetirizine  (ZYRTEC  ALLERGY) 10 MG tablet Take 1 tablet (10 mg total) by mouth daily. 30 tablet 1   clopidogrel  (PLAVIX ) 75 MG tablet Take 75 mg by mouth daily.     diclofenac  Sodium (VOLTAREN ) 1 % GEL Apply 4 grams to the skin 4 times daily. Need appt. 300 g 0   DULoxetine  (CYMBALTA ) 60 MG capsule Take 1 capsule (60 mg total) by mouth daily. 90 capsule 1   Evolocumab  (REPATHA ) 140 MG/ML SOSY Inject 140 mg into the skin every 14 (fourteen) days. 6.3 mL 3   furosemide  (LASIX ) 40 MG tablet TAKE 1 TABLET BY MOUTH DAILY AS NEEDED 30 tablet 0   ipratropium-albuterol  (DUONEB) 0.5-2.5 (3) MG/3ML SOLN Take 3 mLs by nebulization every 6 (six) hours as needed. 180 mL 1   ketoconazole  (NIZORAL ) 2 % cream APPLY TO THE AFFECTED AREA(S) of abdominal fold ONCE daily AS NEEDED 60 g 0   olmesartan -hydrochlorothiazide  (BENICAR  HCT) 20-12.5 MG tablet Take 1 tablet by mouth  every morning. 90 tablet 1   pantoprazole  (PROTONIX ) 20 MG tablet Take 1 tablet (20 mg total) by mouth daily. 90 tablet 1   potassium chloride  SA (KLOR-CON  M) 20 MEQ tablet Take 20 mEq by mouth 2 (two) times daily.     pregabalin  (LYRICA ) 100 MG capsule Take 1 capsule (100 mg total) by mouth 2 (two) times daily. 180 capsule 1   Spacer/Aero-Holding Chambers (AEROCHAMBER MV) inhaler Use as instructed 1 each 0   azithromycin  (ZITHROMAX  Z-PAK) 250 MG tablet Take 2 tablets day one. Take 1 tablet day 2-5 (Patient not taking: Reported on 09/21/2024) 6 each 0   No current facility-administered medications for this visit.   Review of Systems General:  no complaints Skin: no complaints Eyes: no complaints HEENT: no complaints Breasts: no complaints Pulmonary: shortness of breath and cough Cardiac: no complaints Gastrointestinal: no complaints Genitourinary/Sexual: no complaints Ob/Gyn: no complaints Musculoskeletal: no complaints Hematology: no complaints Neurologic/Psych: peripheral neuropathy   Objective:  Physical Examination:  Today's Vitals   09/21/24 1408  BP: (!) 147/76  Pulse: 65  Resp: 18  Temp: 98.6 F (37 C)  SpO2: 97%  Weight: 236 lb 9.6 oz (107.3 kg)  PainSc: 0-No pain   Body mass index is 47.79 kg/m.    ECOG Performance Status: 0 - Asymptomatic  GENERAL: Patient is a well appearing female in no acute distress HEENT:  Atraumatic and normocephalic. PERRL, neck supple. NODES:  No cervical, supraclavicular, axillary, or inguinal lymphadenopathy palpated.  LUNGS:  Normal respiratory effort ABDOMEN:  Soft, nontender. Nondistended. No masses/ascites/hernia/or hepatomegaly.  EXTREMITIES:  No peripheral edema.   SKIN:  Clear with no obvious rashes or skin changes.  NEURO:  Nonfocal. Well oriented.  Appropriate affect.  Pelvic: EGBUS: no lesions Cervix: surgically absent Vagina: no lesions,  no discharge or bleeding Uterus: surgically absent BME: no palpable  masses Rectovaginal: deferred    Assessment:  Molly [name] is a 73 y.o. female diagnosed with stage IIIC1 grade 2 endometrial cancer with bilateral positive pelvic SLNs with macroscopic tumor s/p TLH/BSO pelvic SLN biopsies 10/06/18.  Received sandwich therapy with 3 cycles of carboplatin /taxol , external pelvic radiation with vaginal brachytherapy and then 3 more cycles of chemotherapy completed 6/20.  Clinically, NED today.   Chemotherapy induced peripheral neuropathy- stable.  No new complaints.  Tumor has MLH1 loss with promoter methylation. HER-2 negative.   Chronic cough, uncertain etiology  Medical co-morbidities complicating care: Morbid obesity, CVA 1990 with no residual and no medication, HTN with good BP today.    Plan:   Problem List Items Addressed This Visit       Genitourinary   Endometrial adenocarcinoma (HCC) - Primary   Other Visit Diagnoses       Chronic cough       Relevant Orders   DG Chest 2 View        She is now over 5 years from her surgery. Clinically asymptomatic. NED on exam. She can be released from [facility] clinic as long as her CXR is negative or recurrence.   Continue surveillance. Tumor has MLH1 loss with promoter methylation, so she would be a candidate for immune checkpoint inhibitor therapy if she has a recurrence in the future. HER-2 negative.   CXR to assess chronic cough.   She will follow up with Tinnie Dawn NP in Survivorship clinic in 1 year or sooner if concerning symptoms.    Tinnie Dawn NP, scribed the note. I performed the history, exam, evaluation, and recommendations.   The patient's diagnosis, an outline of the further diagnostic and laboratory studies which will be required, the recommendation.  All questions were answered to the patient's satisfaction.  I personally had a face to face interaction and evaluated the patient. I have reviewed her history and available records and have performed the physical exam.  I have  discussed the case with the patient.   Markice Torbert Isidor Constable, MD

## 2024-09-24 ENCOUNTER — Ambulatory Visit: Payer: Self-pay | Admitting: Student in an Organized Health Care Education/Training Program

## 2024-09-24 DIAGNOSIS — G4733 Obstructive sleep apnea (adult) (pediatric): Secondary | ICD-10-CM

## 2024-09-28 ENCOUNTER — Ambulatory Visit: Payer: Self-pay | Admitting: Obstetrics and Gynecology

## 2024-10-03 ENCOUNTER — Telehealth: Payer: Self-pay

## 2024-10-03 DIAGNOSIS — I700 Atherosclerosis of aorta: Secondary | ICD-10-CM

## 2024-10-03 NOTE — Telephone Encounter (Signed)
 Pharmacy Quality Measure Review  This patient is appearing on a report for being at risk of failing the adherence measure for diabetes and hypertension (ACEi/ARB) medications this calendar year.   Medication: Farxiga   Last fill date: 08/18/24 for 30 day supply This medication was discontinued by cardiology on 08/01/24  Medication: olmesartan /hydrochlorothiazide   Last fill date: 08/18/24 for 30 day supply Contacted pharmacy to refill this medication  MISC: Patient recently switched from [facility] to [facility]. Patient reports that she has been receiving the Repatha  syringe vs the autoinjector. Contacted pharmacy and confirmed that the prescription was sent as the syringe. Will update the prescription to the autoinjector to assist with administration.   Vennie Waymire E. Marsh, PharmD Clinical Pharmacist [facility] [phone]

## 2024-10-04 ENCOUNTER — Encounter

## 2024-10-04 DIAGNOSIS — G4730 Sleep apnea, unspecified: Secondary | ICD-10-CM | POA: Diagnosis not present

## 2024-10-04 DIAGNOSIS — R5383 Other fatigue: Secondary | ICD-10-CM

## 2024-10-07 ENCOUNTER — Other Ambulatory Visit (HOSPITAL_COMMUNITY): Payer: Self-pay

## 2024-10-07 ENCOUNTER — Ambulatory Visit (INDEPENDENT_AMBULATORY_CARE_PROVIDER_SITE_OTHER)

## 2024-10-07 VITALS — BP 128/82 | Ht 59.0 in | Wt 238.0 lb

## 2024-10-07 DIAGNOSIS — Z0000 Encounter for general adult medical examination without abnormal findings: Secondary | ICD-10-CM | POA: Diagnosis not present

## 2024-10-07 NOTE — Patient Instructions (Signed)
 Molly [name],  Thank you for taking the time for your Medicare Wellness Visit. I appreciate your continued commitment to your health goals. Please review the care plan we discussed, and feel free to reach out if I can assist you further.  Medicare recommends these wellness visits once per year to help you and your care team stay ahead of potential health issues. These visits are designed to focus on prevention, allowing your provider to concentrate on managing your acute and chronic conditions during your regular appointments.  Please note that Annual Wellness Visits do not include a physical exam. Some assessments may be limited, especially if the visit was conducted virtually. If needed, we may recommend a separate in-person follow-up with your provider.  Ongoing Care Seeing your primary care provider every 3 to 6 months helps us  monitor your health and provide consistent, personalized care.   Referrals If a referral was made during today's visit and you haven't received any updates within two weeks, please contact the referred provider directly to check on the status.  Recommended Screenings:  Health Maintenance  Topic Date Due   COVID-19 Vaccine (1) Never done   Eye exam for diabetics  Never done   Zoster (Shingles) Vaccine (1 of 2) Never done   Breast Cancer Screening  06/14/2024   Flu Shot  03/28/2025*   Hemoglobin A1C  11/18/2024   Complete foot exam   12/13/2024   Yearly kidney function blood test for diabetes  05/18/2025   Yearly kidney health urinalysis for diabetes  05/18/2025   Medicare Annual Wellness Visit  10/07/2025   DTaP/Tdap/Td vaccine (2 - Td or Tdap) 11/11/2026   Colon Cancer Screening  07/15/2028   Pneumococcal Vaccine for age over 53  Completed   DEXA scan (bone density measurement)  Completed   Hepatitis C Screening  Completed   Meningitis B Vaccine  Aged Out  *Topic was postponed. The date shown is not the original due date.       10/07/2024   10:14 AM   Advanced Directives  Does Patient Have a Medical Advance Directive? No  Would patient like information on creating a medical advance directive? No - Patient declined   Advance Care Planning is important because it: Ensures you receive medical care that aligns with your values, goals, and preferences. Provides guidance to your family and loved ones, reducing the emotional burden of decision-making during critical moments.  Vision: Annual vision screenings are recommended for early detection of glaucoma, cataracts, and diabetic retinopathy. These exams can also reveal signs of chronic conditions such as diabetes and high blood pressure.  Dental: Annual dental screenings help detect early signs of oral cancer, gum disease, and other conditions linked to overall health, including heart disease and diabetes.  Please see the attached documents for additional preventive care recommendations.

## 2024-10-07 NOTE — Progress Notes (Signed)
 Because this visit was a virtual/telehealth visit,  certain criteria was not obtained, such a blood pressure, CBG if applicable, and timed get up and go. Any medications not marked as taking were not mentioned during the medication reconciliation part of the visit. Any vitals not documented were not able to be obtained due to this being a telehealth visit or patient was unable to self-report a recent blood pressure reading due to a lack of equipment at home via telehealth. Vitals that have been documented are verbally provided by the patient.  This visit was performed by a medical professional under my direct supervision. I was immediately available for consultation/collaboration. I have reviewed and agree with the Annual Wellness Visit documentation.  Subjective:   Molly [name] is a 73 y.o. who presents for a Medicare Wellness preventive visit.  As a reminder, Annual Wellness Visits don't include a physical exam, and some assessments may be limited, especially if this visit is performed virtually. We may recommend an in-person follow-up visit with your provider if needed.  Visit Complete: Virtual I connected with  Molly [name] on 10/07/24 by a audio enabled telemedicine application and verified that I am speaking with the correct person using two identifiers.  Patient Location: Home  Provider Location: Home Office  I discussed the limitations of evaluation and management by telemedicine. The patient expressed understanding and agreed to proceed.  Vital Signs: Because this visit was a virtual/telehealth visit, some criteria may be missing or patient reported. Any vitals not documented were not able to be obtained and vitals that have been documented are patient reported.  VideoDeclined- This patient declined Librarian, academic. Therefore the visit was completed with audio only.  Persons Participating in Visit: Patient.  AWV Questionnaire: No: Patient Medicare  AWV questionnaire was not completed prior to this visit.  Cardiac Risk Factors include: advanced age (>36men, >90 women);hypertension;dyslipidemia     Objective:    Today's Vitals   10/07/24 1015 10/07/24 1016  BP: 128/82   Weight: 238 lb (108 kg)   Height: 4' 11 (1.499 m)   PainSc:  0-No pain   Body mass index is 48.07 kg/m.     10/07/2024   10:14 AM 09/21/2024    2:09 PM 07/04/2024   10:57 AM 02/02/2024    6:48 PM 09/23/2023    9:22 AM 09/18/2023    4:00 PM 09/16/2023    1:11 PM  Advanced Directives  Does Patient Have a Medical Advance Directive? No No No Yes Yes Yes Yes  Type of Theme park manager;Living will  Healthcare Power of [place];Living will Living will;Healthcare Power of Attorney  Does patient want to make changes to medical advance directive?  No - Patient declined     Yes (ED - Information included in AVS)  Copy of Healthcare Power of Attorney in Chart?      No - copy requested   Would patient like information on creating a medical advance directive? No - Patient declined  No - Patient declined    Yes (ED - Information included in AVS)    Current Medications (verified) Outpatient Encounter Medications as of 10/07/2024  Medication Sig   albuterol  (VENTOLIN  HFA) 108 (90 Base) MCG/ACT inhaler Inhale 2 puffs into the lungs every 4 (four) hours as needed for wheezing or shortness of breath.   budesonide  (PULMICORT ) 0.5 MG/2ML nebulizer solution Take 2 mLs (0.5 mg total) by nebulization 2 (two) times daily as needed (when  having asthma exacerbation). Rinse out mouth after use and spit   budesonide -formoterol  (SYMBICORT ) 160-4.5 MCG/ACT inhaler Inhale 2 puffs into the lungs in the morning and at bedtime.   Calcium  Carb-Cholecalciferol (CALCIUM  1000 + D PO) Take by mouth.   cetirizine  (ZYRTEC  ALLERGY) 10 MG tablet Take 1 tablet (10 mg total) by mouth daily.   clopidogrel  (PLAVIX ) 75 MG tablet Take 75 mg by mouth daily.   diclofenac  Sodium  (VOLTAREN ) 1 % GEL Apply 4 grams to the skin 4 times daily. Need appt.   DULoxetine  (CYMBALTA ) 60 MG capsule Take 1 capsule (60 mg total) by mouth daily.   Evolocumab  (REPATHA ) 140 MG/ML SOSY Inject 140 mg into the skin every 14 (fourteen) days.   furosemide  (LASIX ) 40 MG tablet TAKE 1 TABLET BY MOUTH DAILY AS NEEDED   ipratropium-albuterol  (DUONEB) 0.5-2.5 (3) MG/3ML SOLN Take 3 mLs by nebulization every 6 (six) hours as needed.   ketoconazole  (NIZORAL ) 2 % cream APPLY TO THE AFFECTED AREA(S) of abdominal fold ONCE daily AS NEEDED   olmesartan -hydrochlorothiazide  (BENICAR  HCT) 20-12.5 MG tablet Take 1 tablet by mouth every morning.   pantoprazole  (PROTONIX ) 20 MG tablet Take 1 tablet (20 mg total) by mouth daily.   potassium chloride  SA (KLOR-CON  M) 20 MEQ tablet Take 20 mEq by mouth 2 (two) times daily.   pregabalin  (LYRICA ) 100 MG capsule Take 1 capsule (100 mg total) by mouth 2 (two) times daily.   Spacer/Aero-Holding Chambers (AEROCHAMBER MV) inhaler Use as instructed   azithromycin  (ZITHROMAX  Z-PAK) 250 MG tablet Take 2 tablets day one. Take 1 tablet day 2-5 (Patient not taking: Reported on 10/07/2024)   No facility-administered encounter medications on file as of 10/07/2024.    Allergies (verified) Ferumoxytol , Liraglutide , Nsaids, Clopidogrel , Statins, Oxycodone , Crestor  [rosuvastatin  calcium ], and Zetia  [ezetimibe ]   History: Past Medical History:  Diagnosis Date   Anxiety    Asthma    history of asthma   Cardiomyopathy (HCC)    Complication of anesthesia    tore hair out and made teeth rough   COVID-19 virus infection 09/08/2019   Depression    Endometrial cancer (HCC) 08/2018   GERD (gastroesophageal reflux disease)    takes prilosec prn   History of CVA (cerebrovascular accident) 12/10/2015   Hyperlipidemia LDL goal <70 12/10/2015   Hypertension    Prediabetes 10/02/2017   A1c 6 in January 2018   Stroke [facility]) 1990    no residual effects   Past Surgical History:   Procedure Laterality Date   ABDOMINAL HYSTERECTOMY     CHOLECYSTECTOMY     COLONOSCOPY WITH PROPOFOL  N/A 01/08/2017   Procedure: COLONOSCOPY WITH PROPOFOL ;  Surgeon: Ruel Kung, MD;  Location: [facility] ENDOSCOPY;  Service: Endoscopy;  Laterality: N/A;   COLONOSCOPY WITH PROPOFOL  N/A 05/05/2018   Procedure: COLONOSCOPY WITH PROPOFOL ;  Surgeon: Kung Ruel, MD;  Location: [facility] ENDOSCOPY;  Service: Gastroenterology;  Laterality: N/A;   COLONOSCOPY WITH PROPOFOL  N/A 07/15/2021   Procedure: COLONOSCOPY WITH PROPOFOL ;  Surgeon: Kung Ruel, MD;  Location: [facility] ENDOSCOPY;  Service: Gastroenterology;  Laterality: N/A;   HERNIA REPAIR  10/2017   umbilical   PORTACATH PLACEMENT N/A 11/04/2018   Procedure: INSERTION PORT-A-CATH;  Surgeon: Jordis Laneta FALCON, MD;  Location: [facility] ORS;  Service: General;  Laterality: N/A;   SENTINEL NODE BIOPSY N/A 10/06/2018   Procedure: SENTINEL NODE BIOPSY;  Surgeon: Mancil Barter, MD;  Location: [facility] ORS;  Service: Gynecology;  Laterality: N/A;   UMBILICAL HERNIA REPAIR N/A 11/17/2017   Procedure: HERNIA REPAIR  UMBILICAL ADULT;  Surgeon: Jordis Laneta FALCON, MD;  Location: [facility] ORS;  Service: General;  Laterality: N/A;   Family History  Problem Relation Age of Onset   Stroke Mother    Hypertension Mother    Dementia Mother    Alzheimer's disease Mother    Gout Father    Asthma Father    Hypertension Father    Dementia Father    Healthy Sister    Stroke Brother    Alzheimer's disease Brother    Healthy Daughter    Hypertension Brother    Healthy Brother    Cancer Paternal Grandmother    Healthy Sister    Healthy Sister    Hypertension Sister    Hypertension Sister    Stroke Brother    Alzheimer's disease Brother    Stroke Brother    Hypertension Brother    Stroke Brother    Hypertension Brother    Healthy Brother    Healthy Brother    Hypertension Daughter    Breast cancer Neg Hx    Social History   Socioeconomic History   Marital status: Divorced     Spouse name: Not on file   Number of children: 2   Years of education: some college   Highest education level: 12th grade  Occupational History   Occupation: Retired    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   Occupation: currently a Education officer, environmental  Tobacco Use   Smoking status: Never   Smokeless tobacco: Never   Tobacco comments:    smoking cessation materials not required  Vaping Use   Vaping status: Never Used  Substance and Sexual Activity   Alcohol use: No    Alcohol/week: 0.0 standard drinks of alcohol   Drug use: No   Sexual activity: Not Currently  Other Topics Concern   Not on file  Social History Narrative   Pt lives alone but currently staying with her daughter   Social Drivers of Corporate investment banker Strain: Low Risk  (10/07/2024)   Overall Financial Resource Strain (CARDIA)    Difficulty of Paying Living Expenses: Not hard at all  Food Insecurity: No Food Insecurity (10/07/2024)   Hunger Vital Sign    Worried About Running Out of Food in the Last Year: Never true    Ran Out of Food in the Last Year: Never true  Transportation Needs: No Transportation Needs (10/07/2024)   PRAPARE - Administrator, Civil Service (Medical): No    Lack of Transportation (Non-Medical): No  Physical Activity: Sufficiently Active (10/07/2024)   Exercise Vital Sign    Days of Exercise per Week: 3 days    Minutes of Exercise per Session: 90 min  Stress: No Stress Concern Present (10/07/2024)   [employer] of Occupational Health - Occupational Stress Questionnaire    Feeling of Stress: Not at all  Social Connections: Moderately Integrated (10/07/2024)   Social Connection and Isolation Panel    Frequency of Communication with Friends and Family: More than three times a week    Frequency of Social Gatherings with Friends and Family: More than three times a week    Attends Religious Services: More than 4 times per year    Active Member of [employer] or Organizations: Yes     Attends Engineer, structural: More than 4 times per year    Marital Status: Divorced    Tobacco Counseling Counseling given: Not Answered Tobacco comments: smoking cessation materials not required  Clinical Intake:  Pre-visit preparation completed: Yes  Pain : No/denies pain Pain Score: 0-No pain     BMI - recorded: 48.07 Nutritional Status: BMI > 30  Obese Nutritional Risks: None Diabetes: No  Lab Results  Component Value Date   HGBA1C 6.7 (H) 05/18/2024   HGBA1C 6.7 (A) 12/14/2023   HGBA1C 6.5 (A) 08/11/2023     How often do you need to have someone help you when you read instructions, pamphlets, or other written materials from your doctor or pharmacy?: 1 - Never  Interpreter Needed?: No  Information entered by :: Kayia Billinger,cma   Activities of Daily Living     10/07/2024   10:20 AM  In your present state of health, do you have any difficulty performing the following activities:  Hearing? 0  Vision? 0  Difficulty concentrating or making decisions? 0  Walking or climbing stairs? 0  Dressing or bathing? 0  Doing errands, shopping? 0  Preparing Food and eating ? N  Using the Toilet? N  In the past six months, have you accidently leaked urine? Y  Do you have problems with loss of bowel control? N  Managing your Medications? N  Managing your Finances? N  Housekeeping or managing your Housekeeping? N    Patient Care Team: Sowles, Krichna, MD as PCP - General (Family Medicine) Connell Davies, MD (Inactive) as Consulting Physician (Obstetrics and Gynecology) Lenn Aran, MD as Referring Physician (Radiation Oncology) Melanee Annah BROCKS, MD as Consulting Physician (Oncology) Mancil Barter, MD as Referring Physician (Obstetrics) Maurie Rayfield BIRCH, RN as Oncology Nurse Navigator Ferrel, Lionel Kari, PA-C (Neurology) Buckley Zachary K, MD as Consulting Physician (Oncology) Karoline Lima, RN as Case Manager Darliss Rogue, MD as  Consulting Physician (Cardiology)  I have updated your Care Teams any recent Medical Services you may have received from other providers in the past year.     Assessment:   This is a routine wellness examination for Meagan.  Hearing/Vision screen Hearing Screening - Comments:: No difficilties Vision Screening - Comments:: Pt wears glasses   Goals Addressed             This Visit's Progress    DIET - INCREASE WATER INTAKE   On track    Recommend to drink at least 6-8 8oz glasses of water per day.       Depression Screen     10/07/2024   10:21 AM 08/08/2024   11:01 AM 07/04/2024   12:04 PM 06/08/2024    9:50 AM 05/18/2024   10:38 AM 02/22/2024    9:48 AM 01/19/2024    2:20 PM  PHQ 2/9 Scores  PHQ - 2 Score 0 0 0 0 0 0 0  PHQ- 9 Score 0   0 0  0    Fall Risk     10/07/2024   10:17 AM 07/04/2024   12:01 PM 06/08/2024    9:48 AM 05/18/2024   10:33 AM 03/15/2024   10:57 AM  Fall Risk   Falls in the past year? 0 0 0 0 0  Number falls in past yr: 0 0 0 0 0  Injury with Fall? 0 0 0 0 0  Risk for fall due to : No Fall Risks Medication side effect  No Fall Risks Medication side effect  Follow up Falls evaluation completed Falls evaluation completed  Falls prevention discussed;Education provided;Falls evaluation completed Falls prevention discussed    MEDICARE RISK AT HOME:  Medicare Risk at Home Any stairs in or around  the home?: Yes If so, are there any without handrails?: No Home free of loose throw rugs in walkways, pet beds, electrical cords, etc?: Yes Adequate lighting in your home to reduce risk of falls?: Yes Life alert?: No Use of a cane, walker or w/c?: No Grab bars in the bathroom?: Yes Shower chair or bench in shower?: No Elevated toilet seat or a handicapped toilet?: No  TIMED UP AND GO:  Was the test performed?  No  Cognitive Function: 6CIT completed        10/07/2024   10:21 AM 09/18/2023    4:02 PM 05/03/2020    8:59 AM 04/07/2019   12:16 PM 04/06/2018     3:22 PM  6CIT Screen  What Year? 0 points 0 points 0 points 0 points 0 points  What month? 0 points 0 points 0 points 0 points 0 points  What time? 0 points 0 points 0 points 0 points 3 points  Count back from 20 0 points 0 points 0 points 0 points 0 points  Months in reverse 0 points 0 points 0 points 0 points 0 points  Repeat phrase 0 points 0 points 0 points 0 points 0 points  Total Score 0 points 0 points 0 points 0 points 3 points    Immunizations Immunization History  Administered Date(s) Administered   Fluad Quad(high Dose 65+) 10/05/2019, 10/15/2020, 11/12/2022   INFLUENZA, HIGH DOSE SEASONAL PF 11/11/2016, 10/02/2017, 09/07/2018, 08/26/2021   Pneumococcal Conjugate-13 04/06/2018   Pneumococcal Polysaccharide-23 11/11/2016   Tdap 11/11/2016    Screening Tests Health Maintenance  Topic Date Due   COVID-19 Vaccine (1) Never done   OPHTHALMOLOGY EXAM  Never done   Zoster Vaccines- Shingrix  (1 of 2) Never done   Mammogram  06/14/2024   Influenza Vaccine  03/28/2025 (Originally 07/29/2024)   HEMOGLOBIN A1C  11/18/2024   FOOT EXAM  12/13/2024   Diabetic kidney evaluation - eGFR measurement  05/18/2025   Diabetic kidney evaluation - Urine ACR  05/18/2025   Medicare Annual Wellness (AWV)  10/07/2025   DTaP/Tdap/Td (2 - Td or Tdap) 11/11/2026   Colonoscopy  07/15/2028   Pneumococcal Vaccine: 50+ Years  Completed   DEXA SCAN  Completed   Hepatitis C Screening  Completed   Meningococcal B Vaccine  Aged Out    Health Maintenance Items Addressed:patient declined  Additional Screening:  Vision Screening: Recommended annual ophthalmology exams for early detection of glaucoma and other disorders of the eye. Is the patient up to date with their annual eye exam?  Yes    Dental Screening: Recommended annual dental exams for proper oral hygiene  Community Resource Referral / Chronic Care Management: CRR required this visit?  No   CCM required this visit?  No   Plan:     I have personally reviewed and noted the following in the patient's chart:   Medical and social history Use of alcohol, tobacco or illicit drugs  Current medications and supplements including opioid prescriptions. Patient is not currently taking opioid prescriptions. Functional ability and status Nutritional status Physical activity Advanced directives List of other physicians Hospitalizations, surgeries, and ER visits in previous 12 months Vitals Screenings to include cognitive, depression, and falls Referrals and appointments  In addition, I have reviewed and discussed with patient certain preventive protocols, quality metrics, and best practice recommendations. A written personalized care plan for preventive services as well as general preventive health recommendations were provided to patient.   Lyle MARLA Right, CMA   10/07/2024  After Visit Summary: (MyChart) Due to this being a telephonic visit, the after visit summary with patients personalized plan was offered to patient via MyChart   Notes: Nothing significant to report at this time.

## 2024-10-10 ENCOUNTER — Encounter: Payer: Self-pay | Admitting: Cardiology

## 2024-10-10 ENCOUNTER — Ambulatory Visit: Attending: Cardiology | Admitting: Cardiology

## 2024-10-10 VITALS — BP 130/72 | HR 72 | Ht 59.0 in | Wt 239.4 lb

## 2024-10-10 DIAGNOSIS — I10 Essential (primary) hypertension: Secondary | ICD-10-CM

## 2024-10-10 DIAGNOSIS — E7800 Pure hypercholesterolemia, unspecified: Secondary | ICD-10-CM | POA: Diagnosis not present

## 2024-10-10 DIAGNOSIS — R002 Palpitations: Secondary | ICD-10-CM

## 2024-10-10 NOTE — Patient Instructions (Signed)
 Medication Instructions:   None ordered at this time   *If you need a refill on your cardiac medications before your next appointment, please call your pharmacy*  Lab Work:  None ordered at this time   If you have labs (blood work) drawn today and your tests are completely normal, you will receive your results only by:  MyChart Message (if you have MyChart) OR  A paper copy in the mail If you have any lab test that is abnormal or we need to change your treatment, we will call you to review the results.  Testing/Procedures:  None ordered at this time   Referrals:  None ordered at this time   Follow-Up:  At [facility], you and your health needs are our priority.  As part of our continuing mission to provide you with exceptional heart care, our providers are all part of one team.  This team includes your primary Cardiologist (physician) and Advanced Practice Providers or APPs (Physician Assistants and Nurse Practitioners) who all work together to provide you with the care you need, when you need it.  Your next appointment:   1 year(s)  Provider:    You may see Dr. Darliss or one of the following Advanced Practice Providers on your designated Care Team:   Lonni Meager, NP Lesley Maffucci, PA-C Bernardino Bring, PA-C Cadence [place], PA-C Tylene Lunch, NP Barnie Hila, NP    We recommend signing up for the patient portal called MyChart.  Sign up information is provided on this After Visit Summary.  MyChart is used to connect with patients for Virtual Visits (Telemedicine).  Patients are able to view lab/test results, encounter notes, upcoming appointments, etc.  Non-urgent messages can be sent to your provider as well.   To learn more about what you can do with MyChart, go to [email or web address].

## 2024-10-10 NOTE — Progress Notes (Signed)
 Cardiology Office Note:    Date:  10/10/2024   ID:  Molly [name], DOB 05-30-1951, MRN [id number]  PCP:  Sowles, Krichna, MD  [facility] Cardiologist:  None  [facility] Electrophysiologist:  None   Referring MD: Sowles, Krichna, MD   Chief Complaint  Patient presents with   Follow-up    1 month follow up pat has been doing well with no complaints of chest pain, chest pressure or SOB, medciation reviewed verbally with patient    History of Present Illness:    Molly [name] is a 73 y.o. female with a hx of hypertension, hyperlipidemia, CVA (in 1995, no residual deficits), asthma, who presents for follow-up.   Last seen due to cough, palpitations after starting Farxiga .  Farxiga  was stopped with resolution of symptoms.  Feels well, denies chest pain or shortness of breath.  Prior notes  Echocardiogram 12/2023 EF 50 to 55% Cardiac monitor 10/2020 no evidence for A-fib or flutter. History of aphasia, patient taking 2 the ED in [place] .  Patient also had some aphasia at time of EMS arrival.  Upon arrival at the ED, patient states symptoms have improved but had a little stuttering in her speech.  TPA was recommended by neurology but patient declined.  MRI brain with no acute intracranial findings.  CT head showed old infarcts.  Carotid Doppler with no significant plaque.   She has history of statin allergies, was started on Repatha  2 days ago by PCP.  She does not take aspirin due to NSAID allergies.     Past Medical History:  Diagnosis Date   Anxiety    Asthma    history of asthma   Cardiomyopathy (HCC)    Complication of anesthesia    tore hair out and made teeth rough   COVID-19 virus infection 09/08/2019   Depression    Endometrial cancer (HCC) 08/2018   GERD (gastroesophageal reflux disease)    takes prilosec prn   History of CVA (cerebrovascular accident) 12/10/2015   Hyperlipidemia LDL goal <70 12/10/2015   Hypertension    Prediabetes 10/02/2017    A1c 6 in January 2018   Stroke [facility]) 1990    no residual effects    Past Surgical History:  Procedure Laterality Date   ABDOMINAL HYSTERECTOMY     CHOLECYSTECTOMY     COLONOSCOPY WITH PROPOFOL  N/A 01/08/2017   Procedure: COLONOSCOPY WITH PROPOFOL ;  Surgeon: Ruel Kung, MD;  Location: [facility] ENDOSCOPY;  Service: Endoscopy;  Laterality: N/A;   COLONOSCOPY WITH PROPOFOL  N/A 05/05/2018   Procedure: COLONOSCOPY WITH PROPOFOL ;  Surgeon: Kung Ruel, MD;  Location: [facility] ENDOSCOPY;  Service: Gastroenterology;  Laterality: N/A;   COLONOSCOPY WITH PROPOFOL  N/A 07/15/2021   Procedure: COLONOSCOPY WITH PROPOFOL ;  Surgeon: Kung Ruel, MD;  Location: [facility] ENDOSCOPY;  Service: Gastroenterology;  Laterality: N/A;   HERNIA REPAIR  10/2017   umbilical   PORTACATH PLACEMENT N/A 11/04/2018   Procedure: INSERTION PORT-A-CATH;  Surgeon: Jordis Laneta FALCON, MD;  Location: [facility] ORS;  Service: General;  Laterality: N/A;   SENTINEL NODE BIOPSY N/A 10/06/2018   Procedure: SENTINEL NODE BIOPSY;  Surgeon: Mancil Barter, MD;  Location: [facility] ORS;  Service: Gynecology;  Laterality: N/A;   UMBILICAL HERNIA REPAIR N/A 11/17/2017   Procedure: HERNIA REPAIR UMBILICAL ADULT;  Surgeon: Jordis Laneta FALCON, MD;  Location: [facility] ORS;  Service: General;  Laterality: N/A;    Current Medications: Current Meds  Medication Sig   albuterol  (VENTOLIN  HFA) 108 (90 Base) MCG/ACT inhaler Inhale 2 puffs into  the lungs every 4 (four) hours as needed for wheezing or shortness of breath.   azithromycin  (ZITHROMAX  Z-PAK) 250 MG tablet Take 2 tablets day one. Take 1 tablet day 2-5   budesonide  (PULMICORT ) 0.5 MG/2ML nebulizer solution Take 2 mLs (0.5 mg total) by nebulization 2 (two) times daily as needed (when having asthma exacerbation). Rinse out mouth after use and spit   budesonide -formoterol  (SYMBICORT ) 160-4.5 MCG/ACT inhaler Inhale 2 puffs into the lungs in the morning and at bedtime.   Calcium  Carb-Cholecalciferol (CALCIUM  1000 + D PO) Take by  mouth.   cetirizine  (ZYRTEC  ALLERGY) 10 MG tablet Take 1 tablet (10 mg total) by mouth daily.   clopidogrel  (PLAVIX ) 75 MG tablet Take 75 mg by mouth daily.   diclofenac  Sodium (VOLTAREN ) 1 % GEL Apply 4 grams to the skin 4 times daily. Need appt.   DULoxetine  (CYMBALTA ) 60 MG capsule Take 1 capsule (60 mg total) by mouth daily.   Evolocumab  (REPATHA ) 140 MG/ML SOSY Inject 140 mg into the skin every 14 (fourteen) days.   furosemide  (LASIX ) 40 MG tablet TAKE 1 TABLET BY MOUTH DAILY AS NEEDED   ipratropium-albuterol  (DUONEB) 0.5-2.5 (3) MG/3ML SOLN Take 3 mLs by nebulization every 6 (six) hours as needed.   ketoconazole  (NIZORAL ) 2 % cream APPLY TO THE AFFECTED AREA(S) of abdominal fold ONCE daily AS NEEDED   olmesartan -hydrochlorothiazide  (BENICAR  HCT) 20-12.5 MG tablet Take 1 tablet by mouth every morning.   pantoprazole  (PROTONIX ) 20 MG tablet Take 1 tablet (20 mg total) by mouth daily.   potassium chloride  SA (KLOR-CON  M) 20 MEQ tablet Take 20 mEq by mouth 2 (two) times daily.   pregabalin  (LYRICA ) 100 MG capsule Take 1 capsule (100 mg total) by mouth 2 (two) times daily.   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   Occupation: currently a Education officer, environmental  Tobacco Use   Smoking status: Never   Smokeless tobacco: Never   Tobacco comments:    smoking cessation materials not required  Vaping Use   Vaping status: Never Used  Substance and Sexual Activity   Alcohol use: No    Alcohol/week: 0.0 standard drinks of alcohol   Drug use: No   Sexual  activity: Not Currently  Other Topics Concern   Not on file  Social History Narrative   Pt lives alone but currently staying with her daughter   Social Drivers of Corporate investment banker Strain: Low Risk  (10/07/2024)   Overall Financial Resource Strain (CARDIA)    Difficulty of Paying Living Expenses: Not hard at all  Food Insecurity: No Food Insecurity (10/07/2024)   Hunger Vital Sign    Worried About Running Out of Food in the Last Year: Never true    Ran Out of Food in the Last Year: Never true  Transportation Needs: No Transportation Needs (10/07/2024)   PRAPARE - Administrator, Civil Service (Medical): No    Lack of Transportation (Non-Medical): No  Physical Activity: Sufficiently Active (10/07/2024)   Exercise Vital Sign  Days of Exercise per Week: 3 days    Minutes of Exercise per Session: 90 min  Stress: No Stress Concern Present (10/07/2024)   [employer] of Occupational Health - Occupational Stress Questionnaire    Feeling of Stress: Not at all  Social Connections: Moderately Integrated (10/07/2024)   Social Connection and Isolation Panel    Frequency of Communication with Friends and Family: More than three times a week    Frequency of Social Gatherings with Friends and Family: More than three times a week    Attends Religious Services: More than 4 times per year    Active Member of [employer] or Organizations: Yes    Attends Engineer, structural: More than 4 times per year    Marital Status: Divorced     Family History: The patient's family history includes Alzheimer's disease in her brother, brother, and mother; Asthma in her father; Cancer in her paternal grandmother; Dementia in her father and mother; Gout in her father; Healthy in her brother, brother, brother, daughter, sister, sister, and sister; Hypertension in her brother, brother, brother, daughter, father, mother, sister, and sister; Stroke in her brother, brother, brother,  brother, and mother. There is no history of Breast cancer.  ROS:   Please see the history of present illness.     All other systems reviewed and are negative.  EKGs/Labs/Other Studies Reviewed:    The following studies were reviewed today:   EKG:  EKG not ordered today.   Recent Labs: 05/18/2024: ALT 9; BUN 13; Creat 0.78; Potassium 4.1; Sodium 140; TSH 1.04 08/31/2024: Hemoglobin 12.0; Platelets 343  Recent Lipid Panel    Component Value Date/Time   CHOL 234 (H) 05/18/2024 1114   TRIG 133 05/18/2024 1114   HDL 60 05/18/2024 1114   CHOLHDL 3.9 05/18/2024 1114   VLDL 20 09/08/2019 1137   LDLCALC 148 (H) 05/18/2024 1114     Risk Assessment/Calculations:      Physical Exam:    VS:  BP 130/72 (BP Location: Left Arm, Patient Position: Sitting)   Pulse 72   Ht 4' 11 (1.499 m)   Wt 239 lb 6.4 oz (108.6 kg)   SpO2 95%   BMI 48.35 kg/m     Wt Readings from Last 3 Encounters:  10/10/24 239 lb 6.4 oz (108.6 kg)  10/07/24 238 lb (108 kg)  09/21/24 236 lb 9.6 oz (107.3 kg)     GEN:  Well nourished, well developed in no acute distress HEENT: Normal NECK: No JVD; No carotid bruits CARDIAC: RRR, no murmurs, rubs, gallops RESPIRATORY:  Clear to auscultation without rales, wheezing or rhonchi  ABDOMEN: Soft, non-tender, distended MUSCULOSKELETAL:  No edema; No deformity  SKIN: Warm and dry NEUROLOGIC:  Alert and oriented x 3 PSYCHIATRIC:  Normal affect   ASSESSMENT:    1. Palpitation   2. Primary hypertension   3. Pure hypercholesterolemia    PLAN:    In order of problems listed above:  Palpitations, fatigue associated with taking Farxiga .  Symptoms resolved since stopping Farxiga .  Echo 1/25 EF 50 to 55% Hypertension, BP controlled.  Continue Benicar -HCTZ 20-12.5 mg daily. History of hyperlipidemia, history of CVA not tolerant to statins.  Continue Repatha , Plavix .  Follow-up in 12 months  Shared Decision Making/Informed Consent       Medication  Adjustments/Labs and Tests Ordered: Current medicines are reviewed at length with the patient today.  Concerns regarding medicines are outlined above.  No orders of the defined types were placed in this  encounter.  No orders of the defined types were placed in this encounter.   Patient Instructions  Medication Instructions:   None ordered at this time   *If you need a refill on your cardiac medications before your next appointment, please call your pharmacy*  Lab Work:  None ordered at this time   If you have labs (blood work) drawn today and your tests are completely normal, you will receive your results only by:  MyChart Message (if you have MyChart) OR  A paper copy in the mail If you have any lab test that is abnormal or we need to change your treatment, we will call you to review the results.  Testing/Procedures:  None ordered at this time   Referrals:  None ordered at this time   Follow-Up:  At [facility], you and your health needs are our priority.  As part of our continuing mission to provide you with exceptional heart care, our providers are all part of one team.  This team includes your primary Cardiologist (physician) and Advanced Practice Providers or APPs (Physician Assistants and Nurse Practitioners) who all work together to provide you with the care you need, when you need it.  Your next appointment:   1 year(s)  Provider:    You may see Dr. Darliss or one of the following Advanced Practice Providers on your designated Care Team:   Lonni Meager, NP Lesley Maffucci, PA-C Bernardino Bring, PA-C Cadence [place], PA-C Tylene Lunch, NP Barnie Hila, NP    We recommend signing up for the patient portal called MyChart.  Sign up information is provided on this After Visit Summary.  MyChart is used to connect with patients for Virtual Visits (Telemedicine).  Patients are able to view lab/test results, encounter notes, upcoming appointments, etc.   Non-urgent messages can be sent to your provider as well.   To learn more about what you can do with MyChart, go to [email or web address].     Signed, Redell Darliss, MD  10/10/2024 12:29 PM    [facility]

## 2024-10-11 ENCOUNTER — Encounter: Payer: Self-pay | Admitting: Family Medicine

## 2024-10-11 ENCOUNTER — Ambulatory Visit: Admitting: Family Medicine

## 2024-10-11 VITALS — BP 124/80 | HR 79 | Resp 16 | Ht 59.0 in | Wt 237.4 lb

## 2024-10-11 DIAGNOSIS — Z23 Encounter for immunization: Secondary | ICD-10-CM

## 2024-10-11 DIAGNOSIS — I152 Hypertension secondary to endocrine disorders: Secondary | ICD-10-CM

## 2024-10-11 DIAGNOSIS — E1159 Type 2 diabetes mellitus with other circulatory complications: Secondary | ICD-10-CM | POA: Diagnosis not present

## 2024-10-11 DIAGNOSIS — I119 Hypertensive heart disease without heart failure: Secondary | ICD-10-CM

## 2024-10-11 DIAGNOSIS — K219 Gastro-esophageal reflux disease without esophagitis: Secondary | ICD-10-CM | POA: Diagnosis not present

## 2024-10-11 DIAGNOSIS — F325 Major depressive disorder, single episode, in full remission: Secondary | ICD-10-CM

## 2024-10-11 DIAGNOSIS — I700 Atherosclerosis of aorta: Secondary | ICD-10-CM

## 2024-10-11 DIAGNOSIS — G620 Drug-induced polyneuropathy: Secondary | ICD-10-CM | POA: Diagnosis not present

## 2024-10-11 DIAGNOSIS — J45909 Unspecified asthma, uncomplicated: Secondary | ICD-10-CM | POA: Insufficient documentation

## 2024-10-11 DIAGNOSIS — M170 Bilateral primary osteoarthritis of knee: Secondary | ICD-10-CM

## 2024-10-11 DIAGNOSIS — I43 Cardiomyopathy in diseases classified elsewhere: Secondary | ICD-10-CM | POA: Diagnosis not present

## 2024-10-11 DIAGNOSIS — J4540 Moderate persistent asthma, uncomplicated: Secondary | ICD-10-CM | POA: Diagnosis not present

## 2024-10-11 DIAGNOSIS — Z8542 Personal history of malignant neoplasm of other parts of uterus: Secondary | ICD-10-CM

## 2024-10-11 DIAGNOSIS — G720 Drug-induced myopathy: Secondary | ICD-10-CM | POA: Insufficient documentation

## 2024-10-11 LAB — POCT GLYCOSYLATED HEMOGLOBIN (HGB A1C): Hemoglobin A1C: 6.8 % — AB (ref 4.0–5.6)

## 2024-10-11 MED ORDER — PANTOPRAZOLE SODIUM 20 MG PO TBEC
20.0000 mg | DELAYED_RELEASE_TABLET | Freq: Every day | ORAL | 1 refills | Status: AC
Start: 2024-10-11 — End: ?

## 2024-10-11 MED ORDER — DICLOFENAC SODIUM 1 % EX GEL: 4.0000 g | Freq: Four times a day (QID) | CUTANEOUS | 0 refills | Status: AC

## 2024-10-11 MED ORDER — OLMESARTAN MEDOXOMIL-HCTZ 20-12.5 MG PO TABS
1.0000 | ORAL_TABLET | Freq: Every morning | ORAL | 1 refills | Status: AC
Start: 2024-10-11 — End: ?

## 2024-10-11 MED ORDER — REPATHA SURECLICK 140 MG/ML ~~LOC~~ SOAJ: 140.0000 mg | SUBCUTANEOUS | 1 refills | Status: AC

## 2024-10-11 MED ORDER — METFORMIN HCL ER 500 MG PO TB24: 500.0000 mg | ORAL_TABLET | Freq: Every evening | ORAL | 1 refills | Status: AC

## 2024-10-11 MED ORDER — POTASSIUM CHLORIDE CRYS ER 20 MEQ PO TBCR: 20.0000 meq | EXTENDED_RELEASE_TABLET | Freq: Every day | ORAL | 0 refills | Status: AC | PRN

## 2024-10-11 MED ORDER — DULOXETINE HCL 60 MG PO CPEP
60.0000 mg | ORAL_CAPSULE | Freq: Every day | ORAL | 1 refills | Status: AC
Start: 2024-10-11 — End: ?

## 2024-10-11 MED ORDER — FUROSEMIDE 40 MG PO TABS: 40.0000 mg | ORAL_TABLET | Freq: Every day | ORAL | 0 refills | Status: AC | PRN

## 2024-10-11 MED ORDER — PREGABALIN 100 MG PO CAPS: 100.0000 mg | ORAL_CAPSULE | Freq: Three times a day (TID) | ORAL | 1 refills | Status: AC

## 2024-10-11 NOTE — Progress Notes (Signed)
 Molly [name]                                          MRN: [id number]   10/11/2024   The [facility] Quality Team Specialist reviewed this patient medical record for the purposes of chart review for care gap closure. The following were reviewed: chart review for care gap closure-diabetic eye exam.    [facility] Quality Team

## 2024-10-11 NOTE — Progress Notes (Signed)
 Name: Molly [name]   MRN: [id number]    DOB: 11/13/1951   Date:10/11/2024       Progress Note  Subjective  Chief Complaint  Chief Complaint  Patient presents with   Medical Management of Chronic Issues    Req DM EXAM from [facility]   Discussed the use of AI scribe software for clinical note transcription with the patient, who gave verbal consent to proceed.  History of Present Illness Molly [name] is a 73 year old female with diabetes, hypertension, and cardiomyopathy who presents for a regular follow-up visit.  Her blood sugar levels at home are generally well-controlled, ranging from 112 to 120 mg/dL upon waking, although her A1c has increased slightly to 6.8%. She attributes this to recent dietary changes, as she has only recently started reducing her intake of bread, rice, and potatoes. She has joined the [employer] and is engaging in water stretches three times a week, which she finds beneficial for her knees.  She is currently taking Benicar  HCTZ 20/12.5 mg for hypertension and furosemide  as needed for swelling. No chest pain, palpitations, or shortness of breath when lying flat. She experiences swelling occasionally, which she manages with furosemide  and potassium supplementation.  She uses Symbicort  as needed for asthma, although she prefers Breo, which she found more effective. She experiences asthma exacerbations with strong scents, such as diesel fumes.  She has a history of cardiomyopathy and denies any symptoms of congestive heart failure. She takes furosemide  and potassium for swelling. She denies orthopnea  She is on pantoprazole  for reflux, which is well-controlled. For peripheral neuropathy secondary to chemotherapy for endometrial cancer, she takes pregabalin  100 mg twice a day, but she feels this is insufficient and prefers a regimen of one in the morning and two at night.  She has dyslipidemia and atherosclerosis and reports statin intolerance, for which she is taking  Repatha . She reports issues with her pharmacy sending the wrong form of Repatha , receiving a syringe instead of the SureClick pen.  She takes duloxetine  for dysthymia, which also helps with pain management. Her mood is stable.  She has a history of a stroke but reports no residual effects. She also has a history of pancreatitis, which limits her options for diabetes medications.    Patient Active Problem List   Diagnosis Date Noted   Asthma, moderate persistent, poorly-controlled 06/08/2024   Hypertension associated with type 2 diabetes mellitus (HCC) 08/11/2023   GERD without esophagitis 08/11/2023   Major depression in remission 04/23/2021   Morbid obesity (HCC) 04/23/2021   Peripheral neuropathy due to chemotherapy 04/23/2021   Myalgia 03/20/2021   Iron deficiency anemia 11/15/2018   Endometrial adenocarcinoma (HCC) 10/29/2018   Umbilical hernia without obstruction and without gangrene    Atherosclerosis of right coronary artery 11/10/2017   Atherosclerosis of abdominal aorta 11/10/2017   Perennial allergic rhinitis with seasonal variation 05/14/2016   Asthma, mild intermittent, well-controlled 05/14/2016   Cardiomyopathy due to hypertension (HCC) 12/10/2015   History of CVA (cerebrovascular accident) without residual deficits 12/10/2015   Hyperlipidemia LDL goal <70 12/10/2015   Statin intolerance 12/10/2015    Past Surgical History:  Procedure Laterality Date   ABDOMINAL HYSTERECTOMY     CHOLECYSTECTOMY     COLONOSCOPY WITH PROPOFOL  N/A 01/08/2017   Procedure: COLONOSCOPY WITH PROPOFOL ;  Surgeon: Ruel Kung, MD;  Location: [facility];  Service: Endoscopy;  Laterality: N/A;   COLONOSCOPY WITH PROPOFOL  N/A 05/05/2018   Procedure: COLONOSCOPY WITH PROPOFOL ;  Surgeon: Kung Ruel, MD;  Location: [facility];  Service: Gastroenterology;  Laterality: N/A;   COLONOSCOPY WITH PROPOFOL  N/A 07/15/2021   Procedure: COLONOSCOPY WITH PROPOFOL ;  Surgeon: Therisa Bi, MD;  Location:  [facility]OSCOPY;  Service: Gastroenterology;  Laterality: N/A;   HERNIA REPAIR  10/2017   umbilical   PORTACATH PLACEMENT N/A 11/04/2018   Procedure: INSERTION PORT-A-CATH;  Surgeon: Jordis Laneta FALCON, MD;  Location: [facility] ORS;  Service: General;  Laterality: N/A;   SENTINEL NODE BIOPSY N/A 10/06/2018   Procedure: SENTINEL NODE BIOPSY;  Surgeon: Mancil Barter, MD;  Location: [facility] ORS;  Service: Gynecology;  Laterality: N/A;   UMBILICAL HERNIA REPAIR N/A 11/17/2017   Procedure: HERNIA REPAIR UMBILICAL ADULT;  Surgeon: Jordis Laneta FALCON, MD;  Location: [facility] ORS;  Service: General;  Laterality: N/A;    Family History  Problem Relation Age of Onset   Stroke Mother    Hypertension Mother    Dementia Mother    Alzheimer's disease Mother    Gout Father    Asthma Father    Hypertension Father    Dementia Father    Healthy Sister    Stroke Brother    Alzheimer's disease Brother    Healthy Daughter    Hypertension Brother    Healthy Brother    Cancer Paternal Grandmother    Healthy Sister    Healthy Sister    Hypertension Sister    Hypertension Sister    Stroke Brother    Alzheimer's disease Brother    Stroke Brother    Hypertension Brother    Stroke Brother    Hypertension Brother    Healthy Brother    Healthy Brother    Hypertension Daughter    Breast cancer Neg Hx     Social History   Tobacco Use   Smoking status: Never   Smokeless tobacco: Never   Tobacco comments:    smoking cessation materials not required  Substance Use Topics   Alcohol use: No    Alcohol/week: 0.0 standard drinks of alcohol     Current Outpatient Medications:    albuterol  (VENTOLIN  HFA) 108 (90 Base) MCG/ACT inhaler, Inhale 2 puffs into the lungs every 4 (four) hours as needed for wheezing or shortness of breath., Disp: 1 each, Rfl: 1   budesonide  (PULMICORT ) 0.5 MG/2ML nebulizer solution, Take 2 mLs (0.5 mg total) by nebulization 2 (two) times daily as needed (when having asthma exacerbation). Rinse  out mouth after use and spit, Disp: 60 mL, Rfl: 0   budesonide -formoterol  (SYMBICORT ) 160-4.5 MCG/ACT inhaler, Inhale 2 puffs into the lungs in the morning and at bedtime., Disp: 1 each, Rfl: 12   Calcium  Carb-Cholecalciferol (CALCIUM  1000 + D PO), Take by mouth., Disp: , Rfl:    cetirizine  (ZYRTEC  ALLERGY) 10 MG tablet, Take 1 tablet (10 mg total) by mouth daily., Disp: 30 tablet, Rfl: 1   clopidogrel  (PLAVIX ) 75 MG tablet, Take 75 mg by mouth daily., Disp: , Rfl:    diclofenac  Sodium (VOLTAREN ) 1 % GEL, Apply 4 grams to the skin 4 times daily. Need appt., Disp: 300 g, Rfl: 0   DULoxetine  (CYMBALTA ) 60 MG capsule, Take 1 capsule (60 mg total) by mouth daily., Disp: 90 capsule, Rfl: 1   Evolocumab  (REPATHA ) 140 MG/ML SOSY, Inject 140 mg into the skin every 14 (fourteen) days., Disp: 6.3 mL, Rfl: 3   furosemide  (LASIX ) 40 MG tablet, TAKE 1 TABLET BY MOUTH DAILY AS NEEDED, Disp: 30 tablet, Rfl: 0   ipratropium-albuterol  (DUONEB) 0.5-2.5 (3) MG/3ML SOLN, Take 3 mLs  by nebulization every 6 (six) hours as needed., Disp: 180 mL, Rfl: 1   ketoconazole  (NIZORAL ) 2 % cream, APPLY TO THE AFFECTED AREA(S) of abdominal fold ONCE daily AS NEEDED, Disp: 60 g, Rfl: 0   olmesartan -hydrochlorothiazide  (BENICAR  HCT) 20-12.5 MG tablet, Take 1 tablet by mouth every morning., Disp: 90 tablet, Rfl: 1   pantoprazole  (PROTONIX ) 20 MG tablet, Take 1 tablet (20 mg total) by mouth daily., Disp: 90 tablet, Rfl: 1   potassium chloride  SA (KLOR-CON  M) 20 MEQ tablet, Take 20 mEq by mouth 2 (two) times daily., Disp: , Rfl:    pregabalin  (LYRICA ) 100 MG capsule, Take 1 capsule (100 mg total) by mouth 2 (two) times daily., Disp: 180 capsule, Rfl: 1   Spacer/Aero-Holding Chambers (AEROCHAMBER MV) inhaler, Use as instructed, Disp: 1 each, Rfl: 0  Allergies  Allergen Reactions   Ferumoxytol  Anaphylaxis   Liraglutide  Other (See Comments)    pancreatitis   Nsaids Hives, Rash and Nausea And Vomiting   Clopidogrel  Other (See  Comments)    Reports intolerance and states causes myalgias   Statins Other (See Comments) and Nausea And Vomiting    Joint pains   Oxycodone  Nausea And Vomiting   Crestor  [Rosuvastatin  Calcium ] Rash   Zetia  [Ezetimibe ] Other (See Comments)    Muscle weakness and joint pain    I personally reviewed active problem list, medication list, allergies, family history with the patient/caregiver today.   ROS  Ten systems reviewed and is negative except as mentioned in HPI    Objective Physical Exam MEASUREMENTS: Weight- a pound lighter. CONSTITUTIONAL: Patient appears well-developed and well-nourished.  No distress. HEENT: Head atraumatic, normocephalic, neck supple. CARDIOVASCULAR: Normal rate, regular rhythm and normal heart sounds.  No murmur heard. No BLE edema. PULMONARY: Effort normal and breath sounds normal. No respiratory distress. ABDOMINAL: There is no tenderness or distention. MUSCULOSKELETAL: Normal gait. Without gross motor or sensory deficit. PSYCHIATRIC: Patient has a normal mood and affect. behavior is normal. Judgment and thought content normal.  Vitals:   10/11/24 0944  BP: 124/80  Pulse: 79  Resp: 16  SpO2: 96%  Weight: 237 lb 6.4 oz (107.7 kg)  Height: 4' 11 (1.499 m)    Body mass index is 47.95 kg/m.  Recent Results (from the past 2160 hours)  Nitric oxide      Status: None   Collection Time: 08/30/24  2:49 PM  Result Value Ref Range   Nitric Oxide  11   Allergen Panel (27) + IGE     Status: None   Collection Time: 08/31/24 10:25 AM  Result Value Ref Range   Class Description Allergens Comment     Comment:     Levels of Specific IgE       Class  Description of Class     ---------------------------  -----  --------------------                    < 0.10         0         Negative            0.10 -    0.31         0/I       Equivocal/Low            0.32 -    0.55         I         Low  0.56 -    1.40         II        Moderate             1.41 -    3.90         III       High            3.91 -   19.00         IV        Very High           19.01 -  100.00         V         Very High                   >100.00         VI        Very High    IgE (Immunoglobulin E), Serum 91 6 - 495 IU/mL   D Pteronyssinus IgE <0.10 Class 0 kU/L   D Farinae IgE <0.10 Class 0 kU/L   Cat Dander IgE <0.10 Class 0 kU/L   Dog Dander IgE <0.10 Class 0 kU/L   [place] Grass IgE <0.10 Class 0 kU/L   Timothy Grass IgE <0.10 Class 0 kU/L   [place]  Bluegrass IgE <0.10 Class 0 kU/L   [place] Grass IgE <0.10 Class 0 kU/L   B[place]ia Grass IgE <0.10 Class 0 kU/L   Cockroach, American IgE <0.10 Class 0 kU/L   Penicillium Chrysogen IgE <0.10 Class 0 kU/L   Cladosporium Herbarum IgE <0.10 Class 0 kU/L   Aspergillus Fumigatus IgE <0.10 Class 0 kU/L   Mucor Racemosus IgE <0.10 Class 0 kU/L   Alternaria Alternata IgE <0.10 Class 0 kU/L   Setomelanomma Rostrat <0.10 Class 0 kU/L   Oak, White IgE <0.10 Class 0 kU/L   Elm, American IgE <0.10 Class 0 kU/L   Maple/Box Elder IgE <0.10 Class 0 kU/L   Common Silver Valrie IgE <0.10 Class 0 kU/L   Hickory, White IgE <0.10 Class 0 kU/L   White Mulberry IgE <0.10 Class 0 kU/L   Cedar, [place] IgE <0.10 Class 0 kU/L   Ragweed, Short IgE <0.10 Class 0 kU/L   Plantain, English IgE <0.10 Class 0 kU/L   Cocklebur IgE <0.10 Class 0 kU/L   Pigweed, Rough IgE <0.10 Class 0 kU/L  CBC with Differential/Platelet     Status: None   Collection Time: 08/31/24 10:25 AM  Result Value Ref Range   WBC 6.2 3.4 - 10.8 x10E3/uL   RBC 4.01 3.77 - 5.28 x10E6/uL   Hemoglobin 12.0 11.1 - 15.9 g/dL   Hematocrit 62.2 65.9 - 46.6 %   MCV 94 79 - 97 fL   MCH 29.9 26.6 - 33.0 pg   MCHC 31.8 31.5 - 35.7 g/dL   RDW 86.8 88.2 - 84.5 %   Platelets 343 150 - 450 x10E3/uL   Neutrophils 56 Not Estab. %   Lymphs 34 Not Estab. %   Monocytes 8 Not Estab. %   Eos 1 Not Estab. %   Basos 1 Not Estab. %   Neutrophils Absolute 3.5 1.4 - 7.0 x10E3/uL    Lymphocytes Absolute 2.1 0.7 - 3.1 x10E3/uL   Monocytes Absolute 0.5 0.1 - 0.9 x10E3/uL   EOS (ABSOLUTE) 0.0 0.0 - 0.4 x10E3/uL   Basophils Absolute 0.0 0.0 - 0.2 x10E3/uL   Immature Granulocytes 0 Not Estab. %  Immature Grans (Abs) 0.0 0.0 - 0.1 x10E3/uL  POCT glycosylated hemoglobin (Hb A1C)     Status: Abnormal   Collection Time: 10/11/24  9:49 AM  Result Value Ref Range   Hemoglobin A1C 6.8 (A) 4.0 - 5.6 %   HbA1c POC (<> result, manual entry)     HbA1c, POC (prediabetic range)     HbA1c, POC (controlled diabetic range)       PHQ2/9:    10/11/2024    9:36 AM 10/07/2024   10:21 AM 08/08/2024   11:01 AM 07/04/2024   12:04 PM 06/08/2024    9:50 AM  Depression screen PHQ 2/9  Decreased Interest 0 0 0 0 0  Down, Depressed, Hopeless 0 0 0 0 0  PHQ - 2 Score 0 0 0 0 0  Altered sleeping 0 0   0  Tired, decreased energy 0 0   0  Change in appetite 0 0   0  Feeling bad or failure about yourself  0 0   0  Trouble concentrating 0 0   0  Moving slowly or fidgety/restless 0 0   0  Suicidal thoughts 0 0   0  PHQ-9 Score 0 0   0  Difficult doing work/chores Not difficult at all Not difficult at all   Not difficult at all    phq 9 is negative  Fall Risk:    10/11/2024    9:35 AM 10/07/2024   10:17 AM 07/04/2024   12:01 PM 06/08/2024    9:48 AM 05/18/2024   10:33 AM  Fall Risk   Falls in the past year? 0 0 0 0 0  Number falls in past yr: 0 0 0 0 0  Injury with Fall? 0 0 0 0 0  Risk for fall due to : No Fall Risks No Fall Risks Medication side effect  No Fall Risks  Follow up Falls evaluation completed Falls evaluation completed Falls evaluation completed  Falls prevention discussed;Education provided;Falls evaluation completed     Assessment and Plan Assessment & Plan Type 2 diabetes mellitus with history of drug-induced acute pancreatitis and intolerance to GLP-1 agonists and SGLT2 inhibitors A1c increased to 6.8%. Blood glucose generally well-controlled. History of  pancreatitis with GLP-1 agonists and intolerance to SGLT2 inhibitors. Metformin  reintroduced for glycemic control and weight management. - Initiate metformin  500 mg at night. - Monitor blood glucose levels regularly. - Continue dietary modifications and physical activity. - Follow up in four months.  Obesity Obesity management includes portion control and carbohydrate reduction. Metformin  reintroduced to assist with weight management. - Initiate metformin  500 mg at night. - Continue dietary modifications and physical activity.  Hypertension and cardiomyopathy due to hypertension Hypertension well-controlled with current regimen. Cardiomyopathy managed without signs of heart failure. - Continue olmesartan  (Benicar  HCTZ) 20/12.5 mg. - Use furosemide  as needed for edema. - Monitor blood pressure regularly.  Edema Intermittent edema managed with furosemide  as needed. - Prescribe furosemide  with potassium supplementation as needed for edema.  Dyslipidemia with statin intolerance and atherosclerotic heart disease Dyslipidemia managed with Repatha  due to statin intolerance. Issues with pharmacy providing correct form of Repatha . - Prescribe Repatha  SureClick pen, 2 mL every 14 days, with a three-month supply. - Coordinate with pharmacy to ensure correct form is dispensed.  Moderate persistent asthma Asthma generally well-controlled with Symbicort . Prefers Breo due to fewer flare-ups. Follow-up with pulmonologist advised. - Continue Symbicort  as needed. - Discuss medication preferences with pulmonologist.  Peripheral neuropathy due to chemotherapy Peripheral neuropathy secondary  to chemotherapy. Current pregabalin  regimen insufficient for symptom control. - Increase pregabalin  to 100 mg three times daily.  Dysthymia Mood stable with current duloxetine  regimen, also aids in pain management. - Continue duloxetine  as prescribed.  Gastroesophageal reflux disease (GERD) GERD  well-controlled with pantoprazole . - Continue pantoprazole  as prescribed.

## 2024-10-14 ENCOUNTER — Other Ambulatory Visit: Payer: Self-pay

## 2024-10-14 NOTE — Patient Outreach (Unsigned)
 Complex Care Management   Visit Note  10/14/2024  Name:  Molly [name] MRN: [id number] DOB: [birth year]  Situation: Referral received for Complex Care Management related to {Criteria:32550} I obtained verbal consent from {CHL AMB Patient/Caregiver:28184}.  Visit completed with {CHL AMB Patient/Caregiver:28184}  {VISIT LOCATION:32553}  Background:   Past Medical History:  Diagnosis Date   Anxiety    Asthma    history of asthma   Cardiomyopathy (HCC)    Complication of anesthesia    tore hair out and made teeth rough   COVID-19 virus infection 09/08/2019   Depression    Endometrial cancer (HCC) 08/2018   GERD (gastroesophageal reflux disease)    takes prilosec prn   History of CVA (cerebrovascular accident) 12/10/2015   Hyperlipidemia LDL goal <70 12/10/2015   Hypertension    Prediabetes 10/02/2017   A1c 6 in January 2018   Stroke [facility]) 1990    no residual effects    Assessment: Patient Reported Symptoms:  Cognitive Cognitive Status: Alert and oriented to person, place, and time, Normal speech and language skills Cognitive/Intellectual Conditions Management [RPT]: None reported or documented in medical history or problem list   Health Maintenance Behaviors: Annual physical exam, Social activities, Spiritual practice(s) Healing Pattern: Average Health Facilitated by: Rest  Neurological Neurological Review of Symptoms: No symptoms reported Neurological Management Strategies: Routine screening Neurological Self-Management Outcome: 4 (good)  HEENT HEENT Symptoms Reported: No symptoms reported HEENT Self-Management Outcome: 4 (good)    Cardiovascular Cardiovascular Symptoms Reported: No symptoms reported Does patient have uncontrolled Hypertension?: No Cardiovascular Self-Management Outcome: 4 (good)  Respiratory Respiratory Symptoms Reported: No symptoms reported Respiratory Self-Management Outcome: 4 (good)  Endocrine Endocrine Symptoms Reported: No symptoms  reported Is patient diabetic?: Yes Is patient checking blood sugars at home?: Yes List most recent blood sugar readings, include date and time of day: Reports fasting blood sugars have ranged from 112 to 118mg /dl Endocrine Self-Management Outcome: 4 (good)  Gastrointestinal Gastrointestinal Symptoms Reported: No symptoms reported Gastrointestinal Self-Management Outcome: 4 (good)    Genitourinary Genitourinary Symptoms Reported: No symptoms reported Genitourinary Self-Management Outcome: 4 (good)  Integumentary Integumentary Symptoms Reported: No symptoms reported Skin Self-Management Outcome: 4 (good)  Musculoskeletal Musculoskelatal Symptoms Reviewed: No symptoms reported Musculoskeletal Self-Management Outcome: 4 (good)      Psychosocial Psychosocial Symptoms Reported: No symptoms reported     Quality of Family Relationships: supportive Do you feel physically threatened by others?: No    10/14/2024    PHQ2-9 Depression Screening   Little interest or pleasure in doing things Not at all  Feeling down, depressed, or hopeless Not at all  PHQ-2 - Total Score 0  Trouble falling or staying asleep, or sleeping too much    Feeling tired or having little energy    Poor appetite or overeating     Feeling bad about yourself - or that you are a failure or have let yourself or your family down    Trouble concentrating on things, such as reading the newspaper or watching television    Moving or speaking so slowly that other people could have noticed.  Or the opposite - being so fidgety or restless that you have been moving around a lot more than usual    Thoughts that you would be better off dead, or hurting yourself in some way    PHQ2-9 Total Score    If you checked off any problems, how difficult have these problems made it for you to do your work, take care of things at  home, or get along with other people    Depression Interventions/Treatment      There were no vitals filed for this  visit.  Medications Reviewed Today     Reviewed by Karoline Lima, RN (Registered Nurse) on 10/14/24 at 1109  Med List Status: <None>   Medication Order Taking? Sig Documenting Provider Last Dose Status Informant  albuterol  (VENTOLIN  HFA) 108 (90 Base) MCG/ACT inhaler [id number]  Inhale 2 puffs into the lungs every 4 (four) hours as needed for wheezing or shortness of breath. Ward, Josette SAILOR, DO  Active   budesonide  (PULMICORT ) 0.5 MG/2ML nebulizer solution [id number]  Take 2 mLs (0.5 mg total) by nebulization 2 (two) times daily as needed (when having asthma exacerbation). Rinse out mouth after use and spit Tapia, Leisa, PA-C  Active   budesonide -formoterol  (SYMBICORT ) 160-4.5 MCG/ACT inhaler [id number]  Inhale 2 puffs into the lungs in the morning and at bedtime. Isadora Hose, MD  Active   Calcium  Carb-Cholecalciferol (CALCIUM  1000 + D PO) [id number]  Take by mouth. [provider]  Active Self  cetirizine  (ZYRTEC  ALLERGY) 10 MG tablet [id number]  Take 1 tablet (10 mg total) by mouth daily. Gareth Mliss FALCON, FNP  Active            Med Note [facility], Dayana Dalporto N   Tue Jan 05, 2024 11:54 AM)    clopidogrel  (PLAVIX ) 75 MG tablet [id number]  Take 75 mg by mouth daily. [provider]  Active   diclofenac  Sodium (VOLTAREN ) 1 % GEL [id number]  Apply 4 g topically 4 (four) times daily. Sowles, Krichna, MD  Active   DULoxetine  (CYMBALTA ) 60 MG capsule [id number]  Take 1 capsule (60 mg total) by mouth daily. Sowles, Krichna, MD  Active   Evolocumab  (REPATHA  SURECLICK) 140 MG/ML EMMANUEL [id number]  Inject 140 mg into the skin every 14 (fourteen) days. Sowles, Krichna, MD  Active   furosemide  (LASIX ) 40 MG tablet [id number]  Take 1 tablet (40 mg total) by mouth daily as needed. Sowles, Krichna, MD  Active   ipratropium-albuterol  (DUONEB) 0.5-2.5 (3) MG/3ML SOLN [id number]  Take 3 mLs by nebulization every 6 (six) hours as needed. Tapia, Leisa, PA-C  Active   ketoconazole  (NIZORAL ) 2 % cream [id number]   APPLY TO THE AFFECTED AREA(S) of abdominal fold ONCE daily AS NEEDED Sowles, Krichna, MD  Active   metFORMIN  (GLUCOPHAGE -XR) 500 MG 24 hr tablet [id number]  Take 1 tablet (500 mg total) by mouth every evening. Sowles, Krichna, MD  Active   olmesartan -hydrochlorothiazide  (BENICAR  HCT) 20-12.5 MG tablet [id number]  Take 1 tablet by mouth every morning. Sowles, Krichna, MD  Active   pantoprazole  (PROTONIX ) 20 MG tablet [id number]  Take 1 tablet (20 mg total) by mouth daily. Sowles, Krichna, MD  Active   potassium chloride  SA (KLOR-CON  M) 20 MEQ tablet [id number]  Take 1 tablet (20 mEq total) by mouth daily as needed. Sowles, Krichna, MD  Active   pregabalin  (LYRICA ) 100 MG capsule [id number]  Take 1 capsule (100 mg total) by mouth 3 (three) times daily. Sowles, Krichna, MD  Active   Spacer/Aero-Holding Chambers (AEROCHAMBER MV) inhaler [id number]  Use as instructed Isadora Hose, MD  Active             Recommendation:   {RECOMMENDATONS:32554}  Follow Up Plan:   {FOLLOWUP:32559}  SIG ***

## 2024-10-14 NOTE — Patient Instructions (Signed)
 Thank you for allowing the Complex Care Management team to participate in your care. It was great speaking with you!  I hope you have a safe and enjoyable trip to [place] !  We will follow up on November 21, 2024 at 1100. Please do not hesitate to contact me if you require assistance prior to our next outreach.   Jackson Acron [facility] Population Health RN Care Manager Direct Dial: [phone]  Fax: [phone] Website: [email or web address]

## 2024-10-16 ENCOUNTER — Other Ambulatory Visit: Payer: Self-pay | Admitting: Family Medicine

## 2024-10-18 DIAGNOSIS — G4733 Obstructive sleep apnea (adult) (pediatric): Secondary | ICD-10-CM | POA: Diagnosis not present

## 2024-10-21 NOTE — Telephone Encounter (Signed)
-----   Message from [facility] D REDDY sent at 10/20/2024  9:09 AM EDT ----- Please notify patient that HST revealed severe OSA, recommend proceeding with APAP therapy set to 4-16 cm H2O, EPR 3 with the Airtouch N30i nasal mask. Please also schedule a 3 month follow up visit  if patient wishes to proceed with CPAP therapy. Thanks    ----- Message ----- From: Vannie Donzell RAMAN Sent: 10/19/2024   5:46 PM EDT To: Pallavi D Reddy, MD

## 2024-10-21 NOTE — Telephone Encounter (Signed)
 LMTCB. E2C2 please advise when patient calls back.

## 2024-11-15 NOTE — Progress Notes (Signed)
 Pharmacy Quality Measure Review  This patient is appearing on a report for being at risk of failing the adherence measure for diabetes medications this calendar year.   Medication: metformin  Last fill date: 11/01/24 for 28 day supply Of note, patient receives medications via pill packaging service  Insurance report was not up to date. No action needed at this time.   Marney Treloar E. Marsh, PharmD, CPP Clinical Pharmacist [facility] [phone]

## 2024-11-21 ENCOUNTER — Other Ambulatory Visit: Payer: Self-pay

## 2024-11-21 NOTE — Patient Outreach (Unsigned)
 Complex Care Management   Visit Note  11/21/2024  Name:  Molly [name] MRN: [id number] DOB: [birth year]  Situation: Referral received for Complex Care Management related to Asthma, Hyperlipidemia and Diabetes. I obtained verbal consent from Patient.  Visit completed with Molly [name] via telephone.  Background:   Past Medical History:  Diagnosis Date   Anxiety    Asthma    history of asthma   Cardiomyopathy (HCC)    Complication of anesthesia    tore hair out and made teeth rough   COVID-19 virus infection 09/08/2019   Depression    Endometrial cancer (HCC) 08/2018   GERD (gastroesophageal reflux disease)    takes prilosec prn   History of CVA (cerebrovascular accident) 12/10/2015   Hyperlipidemia LDL goal <70 12/10/2015   Hypertension    Prediabetes 10/02/2017   A1c 6 in January 2018   Stroke [facility]) 1990    no residual effects    Assessment: Patient Reported Symptoms:  Cognitive Cognitive Status: Alert and oriented to person, place, and time, Normal speech and language skills Cognitive/Intellectual Conditions Management [RPT]: None reported or documented in medical history or problem list Health Maintenance Behaviors: Spiritual practice(s), Healthy diet, Exercise, Annual physical exam, Sleep adequate Healing Pattern: Average Health Facilitated by: Prayer/meditation, Rest, Healthy diet  Neurological Neurological Review of Symptoms: No symptoms reported Neurological Management Strategies: Routine screening Neurological Self-Management Outcome: 4 (good)  HEENT HEENT Symptoms Reported: No symptoms reported HEENT Self-Management Outcome: 4 (good)  Cardiovascular Cardiovascular Symptoms Reported: No symptoms reported Does patient have uncontrolled Hypertension?: No Cardiovascular Management Strategies: Routine screening, Medical device, Adequate rest, Diet modification, Coping strategies, Fluid modification Cardiovascular Self-Management Outcome: 4 (good)  Respiratory  Respiratory Symptoms Reported: No symptoms reported Respiratory Management Strategies: Routine screening, Adequate rest, Asthma action plan, Breathing exercise, Coping strategies, Medication therapy, Spacer/mask Respiratory Self-Management Outcome: 4 (good)  Endocrine Endocrine Symptoms Reported: No symptoms reported Is patient diabetic?: Yes Is patient checking blood sugars at home?: Yes List most recent blood sugar readings, include date and time of day: Reports fasting readings remain in the low 100s Endocrine Self-Management Outcome: 4 (good)  Gastrointestinal Gastrointestinal Symptoms Reported: No symptoms reported Gastrointestinal Self-Management Outcome: 4 (good)  Genitourinary Genitourinary Symptoms Reported: No symptoms reported Genitourinary Self-Management Outcome: 4 (good)  Integumentary Integumentary Symptoms Reported: No symptoms reported Skin Self-Management Outcome: 4 (good)  Musculoskeletal Musculoskelatal Symptoms Reviewed: No symptoms reported Musculoskeletal Management Strategies: Routine screening Musculoskeletal Self-Management Outcome: 4 (good)  Psychosocial Psychosocial Symptoms Reported: No symptoms reported    11/21/2024    PHQ2-9 Depression Screening   Little interest or pleasure in doing things Not at all  Feeling down, depressed, or hopeless Not at all  PHQ-2 - Total Score 0    There were no vitals filed for this visit. Pain Scale: 0-10 Pain Score: 0-No pain  Outpatient Encounter Medications as of 11/21/2024  Medication Sig   albuterol  (VENTOLIN  HFA) 108 (90 Base) MCG/ACT inhaler Inhale 2 puffs into the lungs every 4 (four) hours as needed for wheezing or shortness of breath.   budesonide  (PULMICORT ) 0.5 MG/2ML nebulizer solution Take 2 mLs (0.5 mg total) by nebulization 2 (two) times daily as needed (when having asthma exacerbation). Rinse out mouth after use and spit   budesonide -formoterol  (SYMBICORT ) 160-4.5 MCG/ACT inhaler Inhale 2 puffs into the  lungs in the morning and at bedtime.   Calcium  Carb-Cholecalciferol (CALCIUM  1000 + D PO) Take by mouth.   cetirizine  (ZYRTEC  ALLERGY) 10 MG tablet Take 1 tablet (10 mg total) by  mouth daily.   clopidogrel  (PLAVIX ) 75 MG tablet Take 75 mg by mouth daily.   diclofenac  Sodium (VOLTAREN ) 1 % GEL Apply 4 g topically 4 (four) times daily.   DULoxetine  (CYMBALTA ) 60 MG capsule Take 1 capsule (60 mg total) by mouth daily.   Evolocumab  (REPATHA  SURECLICK) 140 MG/ML SOAJ Inject 140 mg into the skin every 14 (fourteen) days.   furosemide  (LASIX ) 40 MG tablet Take 1 tablet (40 mg total) by mouth daily as needed.   ipratropium-albuterol  (DUONEB) 0.5-2.5 (3) MG/3ML SOLN Take 3 mLs by nebulization every 6 (six) hours as needed.   ketoconazole  (NIZORAL ) 2 % cream APPLY TO THE AFFECTED AREA(S) of abdominal fold ONCE daily AS NEEDED   metFORMIN  (GLUCOPHAGE -XR) 500 MG 24 hr tablet Take 1 tablet (500 mg total) by mouth every evening.   olmesartan -hydrochlorothiazide  (BENICAR  HCT) 20-12.5 MG tablet Take 1 tablet by mouth every morning.   pantoprazole  (PROTONIX ) 20 MG tablet Take 1 tablet (20 mg total) by mouth daily.   potassium chloride  SA (KLOR-CON  M) 20 MEQ tablet Take 1 tablet (20 mEq total) by mouth daily as needed.   pregabalin  (LYRICA ) 100 MG capsule Take 1 capsule (100 mg total) by mouth 3 (three) times daily.   Spacer/Aero-Holding Chambers (AEROCHAMBER MV) inhaler Use as instructed   No facility-administered encounter medications on file as of 11/21/2024.      Recommendation:   Continue Current Plan of Care  Follow Up Plan:   Patient has met all care management goals. Patient has been provided contact information should new needs arise.    Jackson Acron [facility] RN Care Manager Direct Dial: [phone]  Fax: [phone] Website: [email or web address]

## 2024-11-22 NOTE — Patient Instructions (Signed)
 Thank you for allowing the Complex Care Management team to participate in your care. It was great speaking with you!  Keep up the great work managing your care! Please do not hesitate to contact Dr. Glenard if your health needs change and you require assistance. The care team will gladly assist.   Jackson Acron [facility] RN Care Manager Direct Dial: [phone]  Fax: [phone] Website: [email or web address]

## 2024-11-28 ENCOUNTER — Encounter

## 2024-11-28 ENCOUNTER — Ambulatory Visit: Admitting: Student in an Organized Health Care Education/Training Program

## 2024-12-21 ENCOUNTER — Ambulatory Visit: Admitting: Family Medicine

## 2024-12-21 ENCOUNTER — Encounter: Payer: Self-pay | Admitting: Family Medicine

## 2024-12-21 VITALS — BP 128/86 | HR 96 | Temp 98.5°F | Resp 16 | Ht 59.0 in | Wt 237.0 lb

## 2024-12-21 DIAGNOSIS — J45901 Unspecified asthma with (acute) exacerbation: Secondary | ICD-10-CM

## 2024-12-21 DIAGNOSIS — R051 Acute cough: Secondary | ICD-10-CM

## 2024-12-21 DIAGNOSIS — R0981 Nasal congestion: Secondary | ICD-10-CM

## 2024-12-21 LAB — POC COVID19/FLU A&B COMBO
Covid Antigen, POC: NEGATIVE
Influenza A Antigen, POC: NEGATIVE
Influenza B Antigen, POC: NEGATIVE

## 2024-12-21 MED ORDER — FLUTICASONE PROPIONATE 50 MCG/ACT NA SUSP: 2.0000 | Freq: Every day | NASAL | 6 refills | Status: AC | PRN

## 2024-12-21 MED ORDER — PREDNISONE 10 MG (21) PO TBPK: ORAL_TABLET | ORAL | 0 refills | Status: AC

## 2024-12-21 MED ORDER — ALBUTEROL SULFATE (2.5 MG/3ML) 0.083% IN NEBU: 2.5000 mg | INHALATION_SOLUTION | Freq: Once | RESPIRATORY_TRACT | Status: AC

## 2024-12-21 MED ORDER — AMOXICILLIN 500 MG PO CAPS: 500.0000 mg | ORAL_CAPSULE | Freq: Two times a day (BID) | ORAL | 0 refills | Status: AC

## 2024-12-21 NOTE — Progress Notes (Signed)
 "  Acute Office Visit  Subjective:     Patient ID: Molly [name], female    DOB: 1951-08-10, 73 y.o.   MRN: [id number]  Chief Complaint  Patient presents with   Cough   Nasal Congestion     Patient is in today for complaints of cough and sinus congestion. She is a new patient to me. She voices her asthma is flared. She reports symptoms started Saturday evening following her sister's funeral service. She voices on Monday she felt worse. She has been using her nebulizer medication and OTC cold and flu medication. She admits she has not been using Symbicort  as prescribed by pulmonology, only using as needed due to misunderstanding. She voices last night symptoms worsened. She voices yesterday she was experiencing shortness of breath but denies shortness of breath at today's visit. She has not used her Albuterol  inhaler since last Friday. She voices yesterday when she was short of breath she used her Symbicort . As noted, upon further discussion there was misunderstanding that Symbicort  is to be used twice daily every day and not as needed. She voices yesterday she did a breathing treatment and responded well to this allowing adequate relief of shortness of breath.   Review of Systems  Constitutional:  Positive for chills and diaphoresis. Negative for fever.  HENT:  Positive for congestion and sore throat.   Respiratory:  Positive for cough and shortness of breath.   Cardiovascular:  Negative for chest pain and leg swelling.  Gastrointestinal:  Negative for diarrhea, nausea and vomiting.  Musculoskeletal:  Negative for myalgias.        Objective:    BP 128/86   Pulse 96   Temp 98.5 F (36.9 C)   Resp 16   Ht 4' 11 (1.499 m)   Wt 237 lb (107.5 kg)   SpO2 100%   BMI 47.87 kg/m    Physical Exam Constitutional:      General: She is not in acute distress.    Appearance: Normal appearance. She is not toxic-appearing.  HENT:     Nose:     Right Sinus: Maxillary sinus tenderness  present.     Left Sinus: Maxillary sinus tenderness present.  Cardiovascular:     Rate and Rhythm: Normal rate and regular rhythm.  Pulmonary:     Effort: Pulmonary effort is normal. No respiratory distress.     Breath sounds: Wheezing present.  Musculoskeletal:     Right lower leg: No edema.     Left lower leg: No edema.  Skin:    General: Skin is warm and dry.  Neurological:     General: No focal deficit present.     Mental Status: She is alert.  Psychiatric:        Mood and Affect: Mood normal.        Behavior: Behavior normal.     Results for orders placed or performed in visit on 12/21/24  POC Covid19/Flu A&B Antigen  Result Value Ref Range   Influenza A Antigen, POC Negative Negative   Influenza B Antigen, POC Negative Negative   Covid Antigen, POC Negative Negative        Assessment & Plan:   Assessment & Plan Acute cough She voices complaints of cough starting over the weekend, Saturday, following her sister's funeral. She describes symptoms to worsen since Saturday. She voices yesterday was the worst describing associated shortness of breath. She had used her nebulizer medication and voices this worked well. Confirmed asthma diagnosis noted, and  she states she feels that her asthma has flared.   Rapid COVID and flu testing negative. V/s stable. On auscultation wheezing present involving all lung fields. Patient respiratory effort unlabored and respirations even.   -Acute cough in setting of asthma exacerbation -Treat with Prednisone  dose pack and Amoxicillin  as prescribed -Albuterol  nebulizer treatment administered at time of visit. Upon re-evaluation lung fields are clear to auscultation and patient reports positive response  Orders:   POC Covid19/Flu A&B Antigen   albuterol  (PROVENTIL ) (2.5 MG/3ML) 0.083% nebulizer solution 2.5 mg   predniSONE  (STERAPRED UNI-PAK 21 TAB) 10 MG (21) TBPK tablet; On day one take 2 tablets by mouth in the morning with food, take  2 tablets by mouth at lunch with food, take 2 tablets by mouth with evening meal (total of 60mg  on day one). On day two take 2 tablets by mouth in the morning with food, take 1 tablet by mouth at lunch with food, take 2 tablets by mouth with evening meal (total of 50mg  on day two). On day three take 2 tablets by mouth in the morning with food, take 2 tablets by mouth with evening meal (total of 40mg  on day three). On day four take 1 tablet by mouth in the morning with food, take 1 tablet by mouth with lunch with food, take 1 tablet by mouth with evening meal (total of 30mg  on day four). On day five take 1 tablet by mouth in the morning with food, take 1 tablet by mouth with evening meal (total of 20mg  on day five). On day six take 1 tablet by mouth in the morning with food. (Total of 10mg  on day six).   amoxicillin  (AMOXIL ) 500 MG capsule; Take 1 capsule (500 mg total) by mouth 2 (two) times daily for 7 days. Take medication with food  Exacerbation of asthma, unspecified asthma severity, unspecified whether persistent Patient's symptoms and presentation most consistent with asthma exacerbation. Associated symptoms include cough, shortness of breath. She is wheezing at time of auscultation. Asthma exacerbation s/t URI and non-adherence of maintenance asthma therapies which appear due to misunderstanding of instructions.   -Advised patient that Symbicort  is maintenance inhaler, replacing Breo Ellipta , prescribed by pulmonology in 08/2024. Advised that Symbicort  is to be used twice daily as prescribed. Albuterol /Ventolin  is rescue inhaler and to be used as needed for shortness of breath, cough or wheezing.  -Prednisone  dose pack prescribed for management of asthma exacerbation. Note wheezing of all lung fields at time of auscultation -Amoxicillin  500mg  BID x 7 days. -Close follow up with PCP in 4 weeks -Advised to maintain scheduled pulmonology follow up for 01/10/25 to discuss ongoing asthma management and  medications. -Red flag symptoms/ ED precautions advised including fever, worsening shortness of breath, chest pain.   Orders:   albuterol  (PROVENTIL ) (2.5 MG/3ML) 0.083% nebulizer solution 2.5 mg   predniSONE  (STERAPRED UNI-PAK 21 TAB) 10 MG (21) TBPK tablet; On day one take 2 tablets by mouth in the morning with food, take 2 tablets by mouth at lunch with food, take 2 tablets by mouth with evening meal (total of 60mg  on day one). On day two take 2 tablets by mouth in the morning with food, take 1 tablet by mouth at lunch with food, take 2 tablets by mouth with evening meal (total of 50mg  on day two). On day three take 2 tablets by mouth in the morning with food, take 2 tablets by mouth with evening meal (total of 40mg  on day three). On day four  take 1 tablet by mouth in the morning with food, take 1 tablet by mouth with lunch with food, take 1 tablet by mouth with evening meal (total of 30mg  on day four). On day five take 1 tablet by mouth in the morning with food, take 1 tablet by mouth with evening meal (total of 20mg  on day five). On day six take 1 tablet by mouth in the morning with food. (Total of 10mg  on day six).  Sinus congestion She endorses sinus congestion starting last Saturday. She has been taking OTC cold and flu medications.  -Recommended use of Flonase  with instructions to administer 2 sprays once daily in each nostril as needed -Maxillary sinus tenderness present upon palpation. Amoxicillin  500mg  PO BID prescribed as noted above.  Orders:   fluticasone  (FLONASE ) 50 MCG/ACT nasal spray; Place 2 sprays into both nostrils daily as needed for allergies or rhinitis.        Return in 4 weeks (on 01/18/2025) for asthma follow up with PCP.  LAYMON LOISE CORE, FNP   "

## 2025-01-04 ENCOUNTER — Ambulatory Visit: Admitting: Family Medicine

## 2025-01-10 ENCOUNTER — Encounter: Payer: Self-pay | Admitting: Student in an Organized Health Care Education/Training Program

## 2025-01-10 ENCOUNTER — Ambulatory Visit: Admitting: Student in an Organized Health Care Education/Training Program

## 2025-01-10 ENCOUNTER — Ambulatory Visit

## 2025-01-10 VITALS — BP 120/78 | HR 100 | Temp 98.7°F | Ht 59.0 in | Wt 230.4 lb

## 2025-01-10 DIAGNOSIS — Z6841 Body Mass Index (BMI) 40.0 and over, adult: Secondary | ICD-10-CM | POA: Diagnosis not present

## 2025-01-10 DIAGNOSIS — E669 Obesity, unspecified: Secondary | ICD-10-CM | POA: Diagnosis not present

## 2025-01-10 DIAGNOSIS — J4540 Moderate persistent asthma, uncomplicated: Secondary | ICD-10-CM

## 2025-01-10 DIAGNOSIS — G4733 Obstructive sleep apnea (adult) (pediatric): Secondary | ICD-10-CM | POA: Diagnosis not present

## 2025-01-10 LAB — PULMONARY FUNCTION TEST
DL/VA % pred: 129 %
DL/VA: 5.59 ml/min/mmHg/L
DLCO unc % pred: 128 %
DLCO unc: 20.45 ml/min/mmHg
FEF 25-75 Post: 2.15 L/s
FEF 25-75 Pre: 1.79 L/s
FEF2575-%Change-Post: 19 %
FEF2575-%Pred-Post: 145 %
FEF2575-%Pred-Pre: 121 %
FEV1-%Change-Post: 1 %
FEV1-%Pred-Post: 93 %
FEV1-%Pred-Pre: 92 %
FEV1-Post: 1.61 L
FEV1-Pre: 1.59 L
FEV1FVC-%Change-Post: 0 %
FEV1FVC-%Pred-Pre: 109 %
FEV6-%Change-Post: 1 %
FEV6-%Pred-Post: 89 %
FEV6-%Pred-Pre: 88 %
FEV6-Post: 1.96 L
FEV6-Pre: 1.92 L
FEV6FVC-%Pred-Post: 105 %
FEV6FVC-%Pred-Pre: 105 %
FVC-%Change-Post: 1 %
FVC-%Pred-Post: 84 %
FVC-%Pred-Pre: 83 %
FVC-Post: 1.96 L
FVC-Pre: 1.92 L
Post FEV1/FVC ratio: 82 %
Post FEV6/FVC ratio: 100 %
Pre FEV1/FVC ratio: 83 %
Pre FEV6/FVC Ratio: 100 %
RV % pred: -9 %
RV: -0.2 L
TLC % pred: 62 %
TLC: 2.68 L

## 2025-01-10 NOTE — Patient Instructions (Signed)
 Full PFT completed today ? ?

## 2025-01-10 NOTE — Progress Notes (Signed)
 Full PFT completed today ? ?

## 2025-01-10 NOTE — Progress Notes (Unsigned)
 " Assessment & Plan  #Moderate persistent asthma  Asthma well-controlled with current regimen. Recent exacerbation managed with prednisone . Previous PFT's ordered showing normal spirometry and DLCO. There is a suggestion of restriction which is likely secondary to morbid obesity. Allergen panel was negative, and no eosinophilia noted on CBC.  Switched from [facility] to Symbicort  during our last visit with a spacer device with good response.  - Continue Symbicort  two puffs twice daily. - Encouraged weight loss to improve lung expansion.  #Obstructive sleep apnea  Difficulty using CPAP at night due to nightmares and exhaustion. CPAP effective during day or before bed. Nasal mask sample offered. Referral to sleep specialist discussed but declined.  - Provided sample of nasal mask for CPAP use. - Continue CPAP use during the day or before bed. - Consider referral to sleep specialist if nasal mask is ineffective.  #Obesity  Contributing to restrictive lung pattern on PFTs. Weight loss recommended to improve lung function and overall health.  - Encouraged weight loss to improve lung function.   Return in about 6 months (around 07/10/2025).  Belva November, MD [facility]  I spent 31 minutes caring for this patient today, including preparing to see the patient, obtaining a medical history , reviewing a separately obtained history, performing a medically appropriate examination and/or evaluation, counseling and educating the patient/family/caregiver, ordering medications, tests, or procedures, documenting clinical information in the electronic health record, and independently interpreting results (not separately reported/billed) and communicating results to the patient/family/caregiver  End of visit medications:  No orders of the defined types were placed in this encounter.   Current Medications[1]   Subjective:   PATIENT ID: Molly JINNY [name] GENDER: female DOB:  July 21, 1951, MRN: [id number]  Chief Complaint  Patient presents with   Follow-up    No SOB. No wheezing. Cough with clear after she uses the inhalers.  Symbicort - BID, helps with her breathing. Albuterol - PRN. Albuterol  Neb- PRN   Sleep Apnea    Using CPAP during the day. Can not wear it at night because it makes he have bad dreams. Set up 12/27/2024.    HPI  Discussed the use of AI scribe software for clinical note transcription with the patient, who gave verbal consent to proceed.  History of Present Illness  Molly [name] is a 74 year old female with asthma and obstructive sleep apnea who presents for follow-up.  Initial Visit 08/30/2024:  Patient has been followed closely by her cardiologist Dr. Darliss when she presented with a complaint of acute cough.  This was concerning for exacerbation of her asthma for which she is referred to us .  Patient reports having had asthma for the past 2 years and has been on Breo Ellipta  for it since then.   She reports symptom onset prior to her visit with her cardiologist which responded well to albuterol .  She also used her nebulizer yesterday with good response.  She does not have any wheezing at this point nor any cough.  She has some mild exertional dyspnea.  Patient has been on Breo Ellipta  which she feels has worked for her in the past but she does report difficulty using it.  She feels that the powder deposits in her mouth which causes her discomfort.  She is also unable to use it when she is acutely ill. Patient does report symptoms of fatigue and interrupted sleep. She snores but sleep alone and is unable to tell me if she has nocturnal awakenings.  Return Visit 01/10/2025:  Her asthma has been well-controlled with Symbicort , two puffs twice daily. She experienced an exacerbation following a cold after her sister's death, which was managed with prednisone . No issues with inhaler use over the past year and effective control of symptoms,  including cough and wheezing.  Regarding obstructive sleep apnea, she experiences difficulty using her CPAP machine at night due to nightmares and waking up exhausted. She finds daytime or pre-bed use more tolerable. Her daughter notes no wheezing or snoring with CPAP use. She sometimes wakes with a dry mouth.  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  She denies any history of smoking.  Her daughter has dogs but she is not exposed to them all the time.  She has a strong family history of asthma in her brother.   Ancillary information including prior medications, full medical/surgical/family/social histories, and PFTs (when available) are listed below and have been reviewed.    Review of Systems  Constitutional:  Negative for chills, fever, malaise/fatigue and weight loss.  Respiratory:  Positive for shortness of breath. Negative for cough, hemoptysis, sputum production and wheezing.   Cardiovascular:  Negative for chest pain.     Objective:   Vitals:   01/10/25 1601  BP: 120/78  Pulse: 100  Temp: 98.7 F (37.1 C)  SpO2: 96%  Weight: 230 lb 6.4 oz (104.5 kg)  Height: 4' 11 (1.499 m)   96% on RA BMI Readings from Last 3 Encounters:  01/10/25 46.54 kg/m  01/10/25 46.54 kg/m  12/21/24 47.87 kg/m   Wt Readings from Last 3 Encounters:  01/10/25 230 lb 6.4 oz (104.5 kg)  01/10/25 230 lb 6.4 oz (104.5 kg)  12/21/24 237 lb (107.5 kg)    .vitalsmbmi  Physical Exam Constitutional:      Appearance: Normal appearance. She is obese.  Cardiovascular:     Rate and Rhythm: Normal rate and regular rhythm.     Pulses: Normal pulses.     Heart sounds: Normal heart sounds.  Pulmonary:     Effort: Pulmonary effort is normal.     Breath sounds: Normal breath sounds. No wheezing or rales.  Neurological:     General: No focal deficit present.     Mental Status: She is alert and oriented to person, place, and time. Mental status is at baseline.        Ancillary Information    Past Medical History:  Diagnosis Date   Anxiety    Asthma    history of asthma   Cardiomyopathy (HCC)    Complication of anesthesia    tore hair out and made teeth rough   COVID-19 virus infection 09/08/2019   Depression    Endometrial cancer (HCC) 08/2018   GERD (gastroesophageal reflux disease)    takes prilosec prn   History of CVA (cerebrovascular accident) 12/10/2015   Hyperlipidemia LDL goal <70 12/10/2015   Hypertension    Prediabetes 10/02/2017   A1c 6 in January 2018   Stroke [facility]) 1990    no residual effects     Family History  Problem Relation Age of Onset   Stroke Mother    Hypertension Mother    Dementia Mother    Alzheimer's disease Mother    Gout Father    Asthma Father    Hypertension Father    Dementia Father    Healthy Sister    Stroke Brother    Alzheimer's disease Brother    Healthy Daughter    Hypertension Brother  Healthy Brother    Cancer Paternal Grandmother    Healthy Sister    Healthy Sister    Hypertension Sister    Hypertension Sister    Stroke Brother    Alzheimer's disease Brother    Stroke Brother    Hypertension Brother    Stroke Brother    Hypertension Brother    Healthy Brother    Healthy Brother    Hypertension Daughter    Breast cancer Neg Hx      Past Surgical History:  Procedure Laterality Date   ABDOMINAL HYSTERECTOMY     CHOLECYSTECTOMY     COLONOSCOPY WITH PROPOFOL  N/A 01/08/2017   Procedure: COLONOSCOPY WITH PROPOFOL ;  Surgeon: Ruel Kung, MD;  Location: [facility] ENDOSCOPY;  Service: Endoscopy;  Laterality: N/A;   COLONOSCOPY WITH PROPOFOL  N/A 05/05/2018   Procedure: COLONOSCOPY WITH PROPOFOL ;  Surgeon: Kung Ruel, MD;  Location: [facility] ENDOSCOPY;  Service: Gastroenterology;  Laterality: N/A;   COLONOSCOPY WITH PROPOFOL  N/A 07/15/2021   Procedure: COLONOSCOPY WITH PROPOFOL ;  Surgeon: Kung Ruel, MD;  Location: [facility] ENDOSCOPY;  Service: Gastroenterology;  Laterality: N/A;    HERNIA REPAIR  10/2017   umbilical   PORTACATH PLACEMENT N/A 11/04/2018   Procedure: INSERTION PORT-A-CATH;  Surgeon: Jordis Laneta FALCON, MD;  Location: [facility] ORS;  Service: General;  Laterality: N/A;   SENTINEL NODE BIOPSY N/A 10/06/2018   Procedure: SENTINEL NODE BIOPSY;  Surgeon: Mancil Barter, MD;  Location: [facility] ORS;  Service: Gynecology;  Laterality: N/A;   UMBILICAL HERNIA REPAIR N/A 11/17/2017   Procedure: HERNIA REPAIR UMBILICAL ADULT;  Surgeon: Jordis Laneta FALCON, MD;  Location: [facility] ORS;  Service: General;  Laterality: N/A;    Social History   Socioeconomic History   Marital status: Divorced    Spouse name: Not on file   Number of children: 2   Years of education: some college   Highest education level: 12th grade  Occupational History   Occupation: Retired    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   Occupation: currently a education officer, environmental  Tobacco Use   Smoking status: Never   Smokeless tobacco: Never   Tobacco comments:    smoking cessation materials not required  Vaping Use   Vaping status: Never Used  Substance and Sexual Activity   Alcohol use: No    Alcohol/week: 0.0 standard drinks of alcohol   Drug use: No   Sexual activity: Not Currently  Other Topics Concern   Not on file  Social History Narrative   Pt lives alone but currently staying with her daughter   Social Drivers of Health   Tobacco Use: Low Risk (01/10/2025)   Patient History    Smoking Tobacco Use: Never    Smokeless Tobacco Use: Never    Passive Exposure: Not on file  Financial Resource Strain: Low Risk (10/07/2024)   Overall Financial Resource Strain (CARDIA)    Difficulty of Paying Living Expenses: Not hard at all  Food Insecurity: No Food Insecurity (10/07/2024)   Epic    Worried About Programme Researcher, Broadcasting/film/video in the Last Year: Never true    Ran Out of Food in the Last Year: Never true  Transportation Needs: No Transportation Needs (10/07/2024)   Epic    Lack of Transportation (Medical): No    Lack of  Transportation (Non-Medical): No  Physical Activity: Sufficiently Active (10/07/2024)   Exercise Vital Sign    Days of Exercise per Week: 3 days    Minutes of Exercise per Session: 90 min  Stress:  No Stress Concern Present (10/07/2024)   [employer] of Occupational Health - Occupational Stress Questionnaire    Feeling of Stress: Not at all  Social Connections: Moderately Integrated (10/07/2024)   Social Connection and Isolation Panel    Frequency of Communication with Friends and Family: More than three times a week    Frequency of Social Gatherings with Friends and Family: More than three times a week    Attends Religious Services: More than 4 times per year    Active Member of Clubs or Organizations: Yes    Attends Banker Meetings: More than 4 times per year    Marital Status: Divorced  Intimate Partner Violence: Not At Risk (10/07/2024)   Epic    Fear of Current or Ex-Partner: No    Emotionally Abused: No    Physically Abused: No    Sexually Abused: No  Depression (PHQ2-9): Low Risk (11/21/2024)   Depression (PHQ2-9)    PHQ-2 Score: 0  Alcohol Screen: Low Risk (10/07/2024)   Alcohol Screen    Last Alcohol Screening Score (AUDIT): 0  Housing: Low Risk (10/07/2024)   Epic    Unable to Pay for Housing in the Last Year: No    Number of Times Moved in the Last Year: 0    Homeless in the Last Year: No  Utilities: Not At Risk (10/07/2024)   Epic    Threatened with loss of utilities: No  Health Literacy: Adequate Health Literacy (02/22/2024)   B1300 Health Literacy    Frequency of need for help with medical instructions: Never     Allergies[2]   CBC    Component Value Date/Time   WBC 6.2 08/31/2024 1025   WBC 4.8 05/18/2024 1114   RBC 4.01 08/31/2024 1025   RBC 4.15 05/18/2024 1114   HGB 12.0 08/31/2024 1025   HCT 37.7 08/31/2024 1025   PLT 343 08/31/2024 1025   MCV 94 08/31/2024 1025   MCH 29.9 08/31/2024 1025   MCH 30.4 05/18/2024 1114   MCHC  31.8 08/31/2024 1025   MCHC 32.8 05/18/2024 1114   RDW 13.1 08/31/2024 1025   LYMPHSABS 2.1 08/31/2024 1025   MONOABS 0.6 02/02/2024 1851   EOSABS 0.0 08/31/2024 1025   BASOSABS 0.0 08/31/2024 1025    Pulmonary Functions Testing Results:    Latest Ref Rng & Units 01/10/2025    2:57 PM  PFT Results  FVC-Pre L 1.92   FVC-Predicted Pre % 83   FVC-Post L 1.96   FVC-Predicted Post % 84   Pre FEV1/FVC % % 83   Post FEV1/FCV % % 82   FEV1-Pre L 1.59   FEV1-Predicted Pre % 92   FEV1-Post L 1.61   DLCO uncorrected ml/min/mmHg 20.45   DLCO UNC% % 128   DLVA Predicted % 129   TLC L 2.68   TLC % Predicted % 62   RV % Predicted % -9     Outpatient Medications Prior to Visit  Medication Sig Dispense Refill   albuterol  (VENTOLIN  HFA) 108 (90 Base) MCG/ACT inhaler Inhale 2 puffs into the lungs every 4 (four) hours as needed for wheezing or shortness of breath. 1 each 1   budesonide -formoterol  (SYMBICORT ) 160-4.5 MCG/ACT inhaler Inhale 2 puffs into the lungs in the morning and at bedtime. 1 each 12   Calcium  Carb-Cholecalciferol (CALCIUM  1000 + D PO) Take by mouth.     cetirizine  (ZYRTEC  ALLERGY) 10 MG tablet Take 1 tablet (10 mg total) by mouth daily. 30 tablet 1  clopidogrel  (PLAVIX ) 75 MG tablet Take 75 mg by mouth daily.     diclofenac  Sodium (VOLTAREN ) 1 % GEL Apply 4 g topically 4 (four) times daily. 300 g 0   DULoxetine  (CYMBALTA ) 60 MG capsule Take 1 capsule (60 mg total) by mouth daily. 90 capsule 1   Evolocumab  (REPATHA  SURECLICK) 140 MG/ML SOAJ Inject 140 mg into the skin every 14 (fourteen) days. 6 mL 1   fluticasone  (FLONASE ) 50 MCG/ACT nasal spray Place 2 sprays into both nostrils daily as needed for allergies or rhinitis. 16 g 6   furosemide  (LASIX ) 40 MG tablet Take 1 tablet (40 mg total) by mouth daily as needed. 30 tablet 0   ipratropium-albuterol  (DUONEB) 0.5-2.5 (3) MG/3ML SOLN Take 3 mLs by nebulization every 6 (six) hours as needed. 180 mL 1   ketoconazole  (NIZORAL ) 2  % cream APPLY TO THE AFFECTED AREA(S) of abdominal fold ONCE daily AS NEEDED 60 g 0   metFORMIN  (GLUCOPHAGE -XR) 500 MG 24 hr tablet Take 1 tablet (500 mg total) by mouth every evening. 90 tablet 1   olmesartan -hydrochlorothiazide  (BENICAR  HCT) 20-12.5 MG tablet Take 1 tablet by mouth every morning. 90 tablet 1   pantoprazole  (PROTONIX ) 20 MG tablet Take 1 tablet (20 mg total) by mouth daily. 90 tablet 1   potassium chloride  SA (KLOR-CON  M) 20 MEQ tablet Take 1 tablet (20 mEq total) by mouth daily as needed. 30 tablet 0   predniSONE  (STERAPRED UNI-PAK 21 TAB) 10 MG (21) TBPK tablet On day one take 2 tablets by mouth in the morning with food, take 2 tablets by mouth at lunch with food, take 2 tablets by mouth with evening meal (total of 60mg  on day one). On day two take 2 tablets by mouth in the morning with food, take 1 tablet by mouth at lunch with food, take 2 tablets by mouth with evening meal (total of 50mg  on day two). On day three take 2 tablets by mouth in the morning with food, take 2 tablets by mouth with evening meal (total of 40mg  on day three). On day four take 1 tablet by mouth in the morning with food, take 1 tablet by mouth with lunch with food, take 1 tablet by mouth with evening meal (total of 30mg  on day four). On day five take 1 tablet by mouth in the morning with food, take 1 tablet by mouth with evening meal (total of 20mg  on day five). On day six take 1 tablet by mouth in the morning with food. (Total of 10mg  on day six). 21 tablet 0   pregabalin  (LYRICA ) 100 MG capsule Take 1 capsule (100 mg total) by mouth 3 (three) times daily. 270 capsule 1   Spacer/Aero-Holding Chambers (AEROCHAMBER MV) inhaler Use as instructed 1 each 0   budesonide  (PULMICORT ) 0.5 MG/2ML nebulizer solution Take 2 mLs (0.5 mg total) by nebulization 2 (two) times daily as needed (when having asthma exacerbation). Rinse out mouth after use and spit (Patient not taking: Reported on 01/10/2025) 60 mL 0    Facility-Administered Medications Prior to Visit  Medication Dose Route Frequency Provider Last Rate Last Admin   albuterol  (PROVENTIL ) (2.5 MG/3ML) 0.083% nebulizer solution 2.5 mg  2.5 mg Nebulization Once           [1]  Current Outpatient Medications:    albuterol  (VENTOLIN  HFA) 108 (90 Base) MCG/ACT inhaler, Inhale 2 puffs into the lungs every 4 (four) hours as needed for wheezing or shortness of breath., Disp: 1 each, Rfl:  1   budesonide -formoterol  (SYMBICORT ) 160-4.5 MCG/ACT inhaler, Inhale 2 puffs into the lungs in the morning and at bedtime., Disp: 1 each, Rfl: 12   Calcium  Carb-Cholecalciferol (CALCIUM  1000 + D PO), Take by mouth., Disp: , Rfl:    cetirizine  (ZYRTEC  ALLERGY) 10 MG tablet, Take 1 tablet (10 mg total) by mouth daily., Disp: 30 tablet, Rfl: 1   clopidogrel  (PLAVIX ) 75 MG tablet, Take 75 mg by mouth daily., Disp: , Rfl:    diclofenac  Sodium (VOLTAREN ) 1 % GEL, Apply 4 g topically 4 (four) times daily., Disp: 300 g, Rfl: 0   DULoxetine  (CYMBALTA ) 60 MG capsule, Take 1 capsule (60 mg total) by mouth daily., Disp: 90 capsule, Rfl: 1   Evolocumab  (REPATHA  SURECLICK) 140 MG/ML SOAJ, Inject 140 mg into the skin every 14 (fourteen) days., Disp: 6 mL, Rfl: 1   fluticasone  (FLONASE ) 50 MCG/ACT nasal spray, Place 2 sprays into both nostrils daily as needed for allergies or rhinitis., Disp: 16 g, Rfl: 6   furosemide  (LASIX ) 40 MG tablet, Take 1 tablet (40 mg total) by mouth daily as needed., Disp: 30 tablet, Rfl: 0   ipratropium-albuterol  (DUONEB) 0.5-2.5 (3) MG/3ML SOLN, Take 3 mLs by nebulization every 6 (six) hours as needed., Disp: 180 mL, Rfl: 1   ketoconazole  (NIZORAL ) 2 % cream, APPLY TO THE AFFECTED AREA(S) of abdominal fold ONCE daily AS NEEDED, Disp: 60 g, Rfl: 0   metFORMIN  (GLUCOPHAGE -XR) 500 MG 24 hr tablet, Take 1 tablet (500 mg total) by mouth every evening., Disp: 90 tablet, Rfl: 1   olmesartan -hydrochlorothiazide  (BENICAR  HCT) 20-12.5 MG tablet, Take 1 tablet by  mouth every morning., Disp: 90 tablet, Rfl: 1   pantoprazole  (PROTONIX ) 20 MG tablet, Take 1 tablet (20 mg total) by mouth daily., Disp: 90 tablet, Rfl: 1   potassium chloride  SA (KLOR-CON  M) 20 MEQ tablet, Take 1 tablet (20 mEq total) by mouth daily as needed., Disp: 30 tablet, Rfl: 0   predniSONE  (STERAPRED UNI-PAK 21 TAB) 10 MG (21) TBPK tablet, On day one take 2 tablets by mouth in the morning with food, take 2 tablets by mouth at lunch with food, take 2 tablets by mouth with evening meal (total of 60mg  on day one). On day two take 2 tablets by mouth in the morning with food, take 1 tablet by mouth at lunch with food, take 2 tablets by mouth with evening meal (total of 50mg  on day two). On day three take 2 tablets by mouth in the morning with food, take 2 tablets by mouth with evening meal (total of 40mg  on day three). On day four take 1 tablet by mouth in the morning with food, take 1 tablet by mouth with lunch with food, take 1 tablet by mouth with evening meal (total of 30mg  on day four). On day five take 1 tablet by mouth in the morning with food, take 1 tablet by mouth with evening meal (total of 20mg  on day five). On day six take 1 tablet by mouth in the morning with food. (Total of 10mg  on day six)., Disp: 21 tablet, Rfl: 0   pregabalin  (LYRICA ) 100 MG capsule, Take 1 capsule (100 mg total) by mouth 3 (three) times daily., Disp: 270 capsule, Rfl: 1   Spacer/Aero-Holding Chambers (AEROCHAMBER MV) inhaler, Use as instructed, Disp: 1 each, Rfl: 0   budesonide  (PULMICORT ) 0.5 MG/2ML nebulizer solution, Take 2 mLs (0.5 mg total) by nebulization 2 (two) times daily as needed (when having asthma exacerbation). Rinse out mouth  after use and spit (Patient not taking: Reported on 01/10/2025), Disp: 60 mL, Rfl: 0  Current Facility-Administered Medications:    albuterol  (PROVENTIL ) (2.5 MG/3ML) 0.083% nebulizer solution 2.5 mg, 2.5 mg, Nebulization, Once,  [2]  Allergies Allergen Reactions   Ferumoxytol   Anaphylaxis   Liraglutide  Other (See Comments)    pancreatitis   Nsaids Hives, Rash and Nausea And Vomiting   Clopidogrel  Other (See Comments)    Reports intolerance and states causes myalgias   Statins Other (See Comments) and Nausea And Vomiting    Joint pains   Farxiga  [Dapagliflozin ] Other (See Comments)    Palpitation and constipation   Oxycodone  Nausea And Vomiting   Crestor  [Rosuvastatin  Calcium ] Rash   Zetia  [Ezetimibe ] Other (See Comments)    Muscle weakness and joint pain   "

## 2025-01-10 NOTE — Patient Instructions (Signed)
" °  VISIT SUMMARY: During today's visit, we discussed your asthma, obstructive sleep apnea, and obesity. Your asthma is well-controlled with your current medication, and we managed a recent exacerbation with prednisone . We also talked about your difficulties using the CPAP machine for your sleep apnea and provided a nasal mask sample. Additionally, we discussed the importance of weight loss to improve your lung function and overall health.  YOUR PLAN: -MODERATE PERSISTENT ASTHMA: Asthma is a condition where your airways narrow and swell, making it difficult to breathe. Your asthma is well-controlled with Symbicort , and a recent exacerbation was managed with prednisone . Continue using Symbicort , two puffs twice daily, and work on weight loss to help improve your lung function.  -OBSTRUCTIVE SLEEP APNEA: Obstructive sleep apnea is a condition where your breathing stops and starts during sleep. You have difficulty using your CPAP machine at night due to nightmares and exhaustion, but it is effective during the day or before bed. We provided a sample of a nasal mask for CPAP use. Continue using the CPAP during the day or before bed, and consider a referral to a sleep specialist if the nasal mask is not effective.  -OBESITY: Obesity is a condition where excess body fat negatively affects your health. It is contributing to a restrictive lung pattern on your pulmonary function tests. We recommend weight loss to improve your lung function and overall health.  INSTRUCTIONS: Continue using Symbicort , two puffs twice daily. Use the CPAP machine during the day or before bed, and try the nasal mask sample provided. If the nasal mask is not effective, consider a referral to a sleep specialist. Focus on weight loss to improve your lung function and overall health.                      Contains text generated by [employer]bridge.                                 Contains text  generated by Abridge.   "

## 2025-02-10 ENCOUNTER — Ambulatory Visit: Admitting: Family Medicine

## 2025-02-13 ENCOUNTER — Ambulatory Visit: Admitting: Family Medicine

## 2025-09-21 ENCOUNTER — Ambulatory Visit: Admitting: Nurse Practitioner

## 2025-10-12 ENCOUNTER — Ambulatory Visit
